# Patient Record
Sex: Female | Born: 1945
Health system: Southern US, Community
[De-identification: ages and names within clinical notes are randomized; demographics above are authoritative.]

## PROBLEM LIST (undated history)

## (undated) DIAGNOSIS — N951 Menopausal and female climacteric states: Secondary | ICD-10-CM

## (undated) DIAGNOSIS — R112 Nausea with vomiting, unspecified: Secondary | ICD-10-CM

## (undated) DIAGNOSIS — K589 Irritable bowel syndrome without diarrhea: Secondary | ICD-10-CM

## (undated) DIAGNOSIS — Z9221 Personal history of antineoplastic chemotherapy: Secondary | ICD-10-CM

## (undated) DIAGNOSIS — E039 Hypothyroidism, unspecified: Secondary | ICD-10-CM

## (undated) DIAGNOSIS — N3010 Interstitial cystitis (chronic) without hematuria: Secondary | ICD-10-CM

## (undated) DIAGNOSIS — K649 Unspecified hemorrhoids: Secondary | ICD-10-CM

## (undated) DIAGNOSIS — F419 Anxiety disorder, unspecified: Secondary | ICD-10-CM

## (undated) DIAGNOSIS — N761 Subacute and chronic vaginitis: Secondary | ICD-10-CM

## (undated) DIAGNOSIS — M1990 Unspecified osteoarthritis, unspecified site: Secondary | ICD-10-CM

## (undated) DIAGNOSIS — C50919 Malignant neoplasm of unspecified site of unspecified female breast: Secondary | ICD-10-CM

## (undated) DIAGNOSIS — Z923 Personal history of irradiation: Secondary | ICD-10-CM

## (undated) DIAGNOSIS — J45909 Unspecified asthma, uncomplicated: Secondary | ICD-10-CM

## (undated) DIAGNOSIS — C801 Malignant (primary) neoplasm, unspecified: Secondary | ICD-10-CM

## (undated) DIAGNOSIS — E162 Hypoglycemia, unspecified: Secondary | ICD-10-CM

## (undated) DIAGNOSIS — J189 Pneumonia, unspecified organism: Secondary | ICD-10-CM

## (undated) DIAGNOSIS — M797 Fibromyalgia: Secondary | ICD-10-CM

## (undated) DIAGNOSIS — K219 Gastro-esophageal reflux disease without esophagitis: Secondary | ICD-10-CM

## (undated) DIAGNOSIS — Z9889 Other specified postprocedural states: Secondary | ICD-10-CM

## (undated) HISTORY — DX: Menopausal and female climacteric states: N95.1

## (undated) HISTORY — DX: Subacute and chronic vaginitis: N76.1

## (undated) HISTORY — DX: Unspecified hemorrhoids: K64.9

## (undated) HISTORY — DX: Irritable bowel syndrome without diarrhea: K58.9

## (undated) HISTORY — PX: BREAST BIOPSY: SHX20

## (undated) HISTORY — PX: NASAL SINUS SURGERY: SHX719

## (undated) HISTORY — DX: Interstitial cystitis (chronic) without hematuria: N30.10

## (undated) HISTORY — PX: OTHER SURGICAL HISTORY: SHX169

## (undated) HISTORY — PX: BREAST LUMPECTOMY: SHX2

## (undated) HISTORY — PX: CARDIAC CATHETERIZATION: SHX172

## (undated) HISTORY — PX: BREAST SURGERY: SHX581

## (undated) HISTORY — PX: BLADDER SUSPENSION: SHX72

## (undated) HISTORY — DX: Hypoglycemia, unspecified: E16.2

## (undated) HISTORY — PX: BUNIONECTOMY: SHX129

## (undated) HISTORY — PX: COLONOSCOPY: SHX174

## (undated) HISTORY — DX: Personal history of irradiation: Z92.3

## (undated) HISTORY — DX: Fibromyalgia: M79.7

## (undated) HISTORY — DX: Unspecified asthma, uncomplicated: J45.909

## (undated) HISTORY — DX: Anxiety disorder, unspecified: F41.9

## (undated) HISTORY — PX: DENTAL SURGERY: SHX609

---
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
## 1977-04-30 DIAGNOSIS — Z9889 Other specified postprocedural states: Secondary | ICD-10-CM

## 1977-04-30 DIAGNOSIS — R112 Nausea with vomiting, unspecified: Secondary | ICD-10-CM

## 1977-04-30 DIAGNOSIS — J189 Pneumonia, unspecified organism: Secondary | ICD-10-CM

## 1977-04-30 HISTORY — DX: Pneumonia, unspecified organism: J18.9

## 1977-04-30 HISTORY — DX: Other specified postprocedural states: Z98.890

## 1977-04-30 HISTORY — DX: Other specified postprocedural states: R11.2

## 1980-04-30 DIAGNOSIS — M797 Fibromyalgia: Secondary | ICD-10-CM

## 1980-04-30 HISTORY — DX: Fibromyalgia: M79.7

## 1996-04-30 DIAGNOSIS — N761 Subacute and chronic vaginitis: Secondary | ICD-10-CM

## 1996-04-30 DIAGNOSIS — N3010 Interstitial cystitis (chronic) without hematuria: Secondary | ICD-10-CM

## 1996-04-30 HISTORY — DX: Subacute and chronic vaginitis: N76.1

## 1996-04-30 HISTORY — DX: Interstitial cystitis (chronic) without hematuria: N30.10

## 1997-12-31 ENCOUNTER — Ambulatory Visit (HOSPITAL_COMMUNITY): Admission: RE | Admit: 1997-12-31 | Discharge: 1997-12-31 | Payer: Self-pay | Admitting: Urology

## 1998-05-06 ENCOUNTER — Other Ambulatory Visit: Admission: RE | Admit: 1998-05-06 | Discharge: 1998-05-06 | Payer: Self-pay | Admitting: Obstetrics and Gynecology

## 1999-05-22 ENCOUNTER — Encounter: Payer: Self-pay | Admitting: Obstetrics and Gynecology

## 1999-05-22 ENCOUNTER — Ambulatory Visit (HOSPITAL_COMMUNITY): Admission: RE | Admit: 1999-05-22 | Discharge: 1999-05-22 | Payer: Self-pay | Admitting: Obstetrics and Gynecology

## 1999-06-05 ENCOUNTER — Other Ambulatory Visit: Admission: RE | Admit: 1999-06-05 | Discharge: 1999-06-05 | Payer: Self-pay | Admitting: Obstetrics and Gynecology

## 1999-06-06 ENCOUNTER — Other Ambulatory Visit: Admission: RE | Admit: 1999-06-06 | Discharge: 1999-06-06 | Payer: Self-pay | Admitting: Obstetrics and Gynecology

## 1999-06-06 ENCOUNTER — Encounter (INDEPENDENT_AMBULATORY_CARE_PROVIDER_SITE_OTHER): Payer: Self-pay | Admitting: Specialist

## 2000-05-20 ENCOUNTER — Encounter: Admission: RE | Admit: 2000-05-20 | Discharge: 2000-05-20 | Payer: Self-pay | Admitting: Obstetrics and Gynecology

## 2000-05-20 ENCOUNTER — Encounter: Payer: Self-pay | Admitting: Obstetrics and Gynecology

## 2000-06-04 ENCOUNTER — Other Ambulatory Visit: Admission: RE | Admit: 2000-06-04 | Discharge: 2000-06-04 | Payer: Self-pay | Admitting: Obstetrics and Gynecology

## 2001-04-30 DIAGNOSIS — E162 Hypoglycemia, unspecified: Secondary | ICD-10-CM

## 2001-04-30 HISTORY — DX: Hypoglycemia, unspecified: E16.2

## 2001-09-25 ENCOUNTER — Encounter: Admission: RE | Admit: 2001-09-25 | Discharge: 2001-09-25 | Payer: Self-pay | Admitting: Internal Medicine

## 2001-09-25 ENCOUNTER — Encounter: Payer: Self-pay | Admitting: Internal Medicine

## 2001-10-20 ENCOUNTER — Other Ambulatory Visit: Admission: RE | Admit: 2001-10-20 | Discharge: 2001-10-20 | Payer: Self-pay | Admitting: Obstetrics and Gynecology

## 2003-01-26 ENCOUNTER — Other Ambulatory Visit: Admission: RE | Admit: 2003-01-26 | Discharge: 2003-01-26 | Payer: Self-pay | Admitting: Obstetrics and Gynecology

## 2003-01-27 ENCOUNTER — Encounter: Payer: Self-pay | Admitting: Obstetrics and Gynecology

## 2003-01-27 ENCOUNTER — Encounter: Admission: RE | Admit: 2003-01-27 | Discharge: 2003-01-27 | Payer: Self-pay | Admitting: Obstetrics and Gynecology

## 2004-02-03 ENCOUNTER — Other Ambulatory Visit: Admission: RE | Admit: 2004-02-03 | Discharge: 2004-02-03 | Payer: Self-pay | Admitting: Obstetrics and Gynecology

## 2004-03-06 ENCOUNTER — Ambulatory Visit: Payer: Self-pay | Admitting: Infectious Diseases

## 2004-03-20 ENCOUNTER — Ambulatory Visit: Payer: Self-pay | Admitting: Infectious Diseases

## 2004-04-10 ENCOUNTER — Ambulatory Visit: Payer: Self-pay | Admitting: Infectious Diseases

## 2004-05-09 ENCOUNTER — Ambulatory Visit: Payer: Self-pay | Admitting: Infectious Diseases

## 2004-06-28 ENCOUNTER — Emergency Department (HOSPITAL_COMMUNITY): Admission: EM | Admit: 2004-06-28 | Discharge: 2004-06-28 | Payer: Self-pay | Admitting: Emergency Medicine

## 2004-07-06 ENCOUNTER — Inpatient Hospital Stay (HOSPITAL_COMMUNITY): Admission: RE | Admit: 2004-07-06 | Discharge: 2004-07-12 | Payer: Self-pay | Admitting: Orthopedic Surgery

## 2004-07-06 HISTORY — PX: LEG SURGERY: SHX1003

## 2005-11-02 ENCOUNTER — Other Ambulatory Visit: Admission: RE | Admit: 2005-11-02 | Discharge: 2005-11-02 | Payer: Self-pay | Admitting: Obstetrics and Gynecology

## 2005-11-07 ENCOUNTER — Encounter: Admission: RE | Admit: 2005-11-07 | Discharge: 2005-11-07 | Payer: Self-pay | Admitting: Obstetrics and Gynecology

## 2006-02-08 ENCOUNTER — Encounter (INDEPENDENT_AMBULATORY_CARE_PROVIDER_SITE_OTHER): Payer: Self-pay | Admitting: Specialist

## 2006-02-08 ENCOUNTER — Inpatient Hospital Stay (HOSPITAL_COMMUNITY): Admission: EM | Admit: 2006-02-08 | Discharge: 2006-02-10 | Payer: Self-pay | Admitting: *Deleted

## 2006-02-08 HISTORY — PX: CHOLECYSTECTOMY: SHX55

## 2006-04-30 DIAGNOSIS — M1990 Unspecified osteoarthritis, unspecified site: Secondary | ICD-10-CM

## 2006-04-30 HISTORY — DX: Unspecified osteoarthritis, unspecified site: M19.90

## 2006-12-24 ENCOUNTER — Encounter: Admission: RE | Admit: 2006-12-24 | Discharge: 2006-12-24 | Payer: Self-pay | Admitting: Internal Medicine

## 2006-12-31 ENCOUNTER — Other Ambulatory Visit: Admission: RE | Admit: 2006-12-31 | Discharge: 2006-12-31 | Payer: Self-pay | Admitting: Obstetrics and Gynecology

## 2007-05-01 DIAGNOSIS — K649 Unspecified hemorrhoids: Secondary | ICD-10-CM

## 2007-05-01 HISTORY — DX: Unspecified hemorrhoids: K64.9

## 2007-07-22 ENCOUNTER — Encounter: Payer: Self-pay | Admitting: Infectious Diseases

## 2007-08-27 ENCOUNTER — Ambulatory Visit: Payer: Self-pay | Admitting: Infectious Diseases

## 2007-08-27 DIAGNOSIS — M810 Age-related osteoporosis without current pathological fracture: Secondary | ICD-10-CM

## 2007-08-27 DIAGNOSIS — M797 Fibromyalgia: Secondary | ICD-10-CM

## 2007-08-27 DIAGNOSIS — N760 Acute vaginitis: Secondary | ICD-10-CM

## 2007-08-27 DIAGNOSIS — N3010 Interstitial cystitis (chronic) without hematuria: Secondary | ICD-10-CM | POA: Insufficient documentation

## 2007-10-08 ENCOUNTER — Ambulatory Visit: Payer: Self-pay | Admitting: Infectious Diseases

## 2007-10-17 ENCOUNTER — Telehealth: Payer: Self-pay | Admitting: Infectious Diseases

## 2008-01-08 ENCOUNTER — Ambulatory Visit: Payer: Self-pay | Admitting: Obstetrics and Gynecology

## 2008-01-08 ENCOUNTER — Encounter: Payer: Self-pay | Admitting: Obstetrics and Gynecology

## 2008-01-08 ENCOUNTER — Other Ambulatory Visit: Admission: RE | Admit: 2008-01-08 | Discharge: 2008-01-08 | Payer: Self-pay | Admitting: Obstetrics and Gynecology

## 2008-02-16 ENCOUNTER — Encounter: Admission: RE | Admit: 2008-02-16 | Discharge: 2008-02-16 | Payer: Self-pay | Admitting: Internal Medicine

## 2008-12-29 ENCOUNTER — Encounter: Admission: RE | Admit: 2008-12-29 | Discharge: 2008-12-29 | Payer: Self-pay | Admitting: Obstetrics and Gynecology

## 2009-01-10 ENCOUNTER — Encounter: Payer: Self-pay | Admitting: Obstetrics and Gynecology

## 2009-01-10 ENCOUNTER — Ambulatory Visit: Payer: Self-pay | Admitting: Obstetrics and Gynecology

## 2009-01-10 ENCOUNTER — Other Ambulatory Visit: Admission: RE | Admit: 2009-01-10 | Discharge: 2009-01-10 | Payer: Self-pay | Admitting: Obstetrics and Gynecology

## 2009-01-11 ENCOUNTER — Ambulatory Visit: Payer: Self-pay | Admitting: Obstetrics and Gynecology

## 2009-06-06 ENCOUNTER — Ambulatory Visit: Payer: Self-pay | Admitting: Obstetrics and Gynecology

## 2009-06-14 ENCOUNTER — Encounter (INDEPENDENT_AMBULATORY_CARE_PROVIDER_SITE_OTHER): Payer: Self-pay | Admitting: *Deleted

## 2009-06-14 DIAGNOSIS — K219 Gastro-esophageal reflux disease without esophagitis: Secondary | ICD-10-CM

## 2009-06-14 DIAGNOSIS — G4700 Insomnia, unspecified: Secondary | ICD-10-CM | POA: Insufficient documentation

## 2009-06-14 DIAGNOSIS — E039 Hypothyroidism, unspecified: Secondary | ICD-10-CM | POA: Insufficient documentation

## 2009-06-14 DIAGNOSIS — G609 Hereditary and idiopathic neuropathy, unspecified: Secondary | ICD-10-CM | POA: Insufficient documentation

## 2009-06-14 DIAGNOSIS — K649 Unspecified hemorrhoids: Secondary | ICD-10-CM | POA: Insufficient documentation

## 2009-06-14 DIAGNOSIS — K589 Irritable bowel syndrome without diarrhea: Secondary | ICD-10-CM

## 2009-06-14 DIAGNOSIS — F329 Major depressive disorder, single episode, unspecified: Secondary | ICD-10-CM

## 2009-06-14 DIAGNOSIS — J309 Allergic rhinitis, unspecified: Secondary | ICD-10-CM | POA: Insufficient documentation

## 2009-06-14 HISTORY — DX: Irritable bowel syndrome, unspecified: K58.9

## 2009-06-15 ENCOUNTER — Ambulatory Visit: Payer: Self-pay | Admitting: Infectious Diseases

## 2009-07-06 ENCOUNTER — Encounter: Payer: Self-pay | Admitting: Infectious Diseases

## 2009-07-06 ENCOUNTER — Ambulatory Visit: Payer: Self-pay | Admitting: Infectious Diseases

## 2009-08-01 ENCOUNTER — Encounter: Payer: Self-pay | Admitting: Infectious Diseases

## 2009-09-12 ENCOUNTER — Encounter: Admission: RE | Admit: 2009-09-12 | Discharge: 2009-09-12 | Payer: Self-pay | Admitting: Obstetrics and Gynecology

## 2010-01-17 ENCOUNTER — Other Ambulatory Visit: Admission: RE | Admit: 2010-01-17 | Discharge: 2010-01-17 | Payer: Self-pay | Admitting: Obstetrics and Gynecology

## 2010-01-17 ENCOUNTER — Ambulatory Visit: Payer: Self-pay | Admitting: Obstetrics and Gynecology

## 2010-01-24 ENCOUNTER — Ambulatory Visit: Payer: Self-pay | Admitting: Obstetrics and Gynecology

## 2010-02-07 ENCOUNTER — Ambulatory Visit: Payer: Self-pay | Admitting: Obstetrics and Gynecology

## 2010-02-09 ENCOUNTER — Ambulatory Visit: Payer: Self-pay | Admitting: Obstetrics and Gynecology

## 2010-05-21 ENCOUNTER — Encounter: Payer: Self-pay | Admitting: Internal Medicine

## 2010-05-30 NOTE — Miscellaneous (Signed)
Summary: [facility]   Imported By: Florinda Marker 08/12/2009 14:54:42  _____________________________________________________________________  External Attachment:    Type:   Image     Comment:   External Document

## 2010-05-30 NOTE — Assessment & Plan Note (Signed)
Summary: Office Visit (Infectious Disease)    CC:  3 week f/u.  History of Present Illness: 65 yo F with hx of recurrent yeast infections.  She has previously had vaginal cx with glabrata.Was seen in ID clinic 2009. Was given voriconazole.  Has had ongoing vaginitis since prev visit. Has been using metrogel. Having green d/c with odor, no improvement with metrogel. Then tried terazol without improvement. had severe headache as well and abd discomfort. Has alos had anal pain and d/c. Has had occas f/c. Was seen by MD and satrted on ketoconazole and nystatin vaginal inserts. No improvement. Started taking vfend 2-12 and had some improvement. NL CBC and CMP 06-13-09.  At her previous ID visit she was restarted on her voriconazole. She had a nl vaginal cx and her fungal cx is no growth to date. now feels better. not totally gone but decreased burning (intermittent). Still some d/c but better. feels like her systemic signs have resolved.   Preventive Screening-Counseling & Management  Alcohol-Tobacco     Alcohol drinks/day: 0     Smoking Status: never     Year Quit: 12 YEARS AGO  Caffeine-Diet-Exercise     Caffeine use/day: coffee     Does Patient Exercise: no  Safety-Violence-Falls     Seat Belt Use: yes   Updated Prior Medication List: FEMHRT LOW DOSE 0.5-2.5 MG-MCG  TABS (NORETHINDRONE-ETH ESTRADIOL) Take 1 tablet by mouth once a day SYNTHROID 137 MCG TABS (LEVOTHYROXINE SODIUM)  DYAZIDE 37.5-25 MG  CAPS (TRIAMTERENE-HCTZ) Take 1 tablet by mouth once a day WELLBUTRIN XL 300 MG  TB24 (BUPROPION HCL) Take 1 tablet by mouth once a day PROTONIX 40 MG  PACK (PANTOPRAZOLE SODIUM) Take 1 tablet by mouth once a day VICODIN 5-500 MG  TABS (HYDROCODONE-ACETAMINOPHEN) as needed AMBIEN 10 MG  TABS (ZOLPIDEM TARTRATE) Take 1 tablet by mouth at bedtime TERAZOL 7 0.4 %  CREA (TERCONAZOLE) as needed VALTREX 1 GM  TABS (VALACYCLOVIR HCL) as needed SOMA 350 MG  TABS (CARISOPRODOL) Take 1 tablet by  mouth at bedtime VFEND 200 MG  TABS (VORICONAZOLE) once weekly METROGEL-VAGINAL 0.75 %  GEL (METRONIDAZOLE) 1 application CLOTRIMAZOLE-BETAMETHASONE 1-0.05 %  CREA (CLOTRIMAZOLE-BETAMETHASONE) Apply 2 times a day for 2 weeks FLUTICASONE PROPIONATE 50 MCG/ACT SUSP (FLUTICASONE PROPIONATE) One spray in each nostril once daily per PCP CALCIUM-VITAMIN D 600-200 MG-UNIT TABS (CALCIUM-VITAMIN D) Take 1 tablet by mouth once a day per PCP MULTIVITAMINS  TABS (MULTIPLE VITAMIN) Take 1 tablet by mouth once a day per PCP AXERT 6.25 MG TABS (ALMOTRIPTAN MALATE) Take 1 tablet by mouth once a day as needed per PCP ANUSOL-HC 25 MG SUPP (HYDROCORTISONE ACETATE) as directed VFEND 200 MG TABS (VORICONAZOLE) 1 by mouth two times a day  Current Allergies (reviewed today): ! PENICILLIN ! MACRODANTIN ! * DIFLUCAN Vital Signs:  Patient profile:   65 year old female Height:      53 inches (134.62 cm) Weight:      164.3 pounds (74.68 kg) BMI:     41.27 Temp:     97.5 degrees F (36.39 degrees C) oral Pulse rate:   67 / minute BP sitting:   133 / 83  (left arm)  Vitals Entered By: Baxter Hire) (July 06, 2009 9:14 AM) CC: 3 week f/u Pain Assessment Patient in pain? no      Nutritional Status BMI of > 30 = obese Nutritional Status Detail appetite is okay  Does patient need assistance? Functional Status Self care Ambulation Normal  Physical Exam  General:  well-developed, well-nourished, and well-hydrated.     Impression & Recommendations:  Problem # 1:  VULVOVAGINITIS, CHRONIC (ICD-616.10) spoke at length that she may have negative cx due to being on antifungal at time of cx. that this may be due to changes in vaginal mucosa with age. her meds are refilled and she will return to clinic as needed.  The following medications were removed from the medication list:    Terazol 7 0.4 % Crea (Terconazole) .Marland Kitchen... As needed    Metrogel-vaginal 0.75 % Gel (Metronidazole) .Marland Kitchen... 1  application  Medications Added to Medication List This Visit: 1)  Clotrimazole-betamethasone 1-0.05 % Crea (Clotrimazole-betamethasone) .... Apply 2 times a day for 2 weeks Prescriptions: CLOTRIMAZOLE-BETAMETHASONE 1-0.05 %  CREA (CLOTRIMAZOLE-BETAMETHASONE) Apply 2 times a day for 2 weeks  #15g x 3   Entered and Authorized by:   Johny Sax MD   Signed by:   Johny Sax MD on 07/06/2009   Method used:   Electronically to        [facility] [phone]* (retail)       [address] [place], [place]  [address]       Ph: [phone] or [phone]       Fax: [phone]   RxID:   [id number] VFEND 200 MG TABS (VORICONAZOLE) 1 by mouth two times a day  #10 x 4   Entered and Authorized by:   Johny Sax MD   Signed by:   Johny Sax MD on 07/06/2009   Method used:   Electronically to        [facility] [phone]* (retail)       [address] [place], [place]  [address]       Ph: [phone] or [phone]       Fax: [phone]   RxID:   [id number]   Appended Document: Orders Update    Clinical Lists Changes  Orders: Added new Service order of Est. Patient Level III (70623) - Signed

## 2010-05-30 NOTE — Miscellaneous (Signed)
Summary: Problem and Medication lists updated  Clinical Lists Changes  Problems: Added new problem of PURE HYPERCHOLESTEROLEMIA (ICD-272.0) - Signed Added new problem of ESSENTIAL HYPERTENSION, BENIGN (ICD-401.1) - Signed Added new problem of UNSPECIFIED HYPOTHYROIDISM (ICD-244.9) - Signed Added new problem of DEPRESSIVE DISORDER NOT ELSEWHERE CLASSIFIED (ICD-311) - Signed Added new problem of PERIPHERAL NEUROPATHY (ICD-356.9) - Signed Added new problem of UNSPEC HEMORRHOIDS WITHOUT MENTION COMPLICATION (ICD-455.6) - Signed Added new problem of ALLERGIC RHINITIS CAUSE UNSPECIFIED (ICD-477.9) - Signed Added new problem of ACUTE GASTRITIS WITHOUT MENTION OF HEMORRHAGE (ICD-535.00) - Signed Added new problem of ESOPHAGEAL REFLUX (ICD-530.81) - Signed Added new problem of IRRITABLE BOWEL SYNDROME (ICD-564.1) - Signed Added new problem of INSOMNIA UNSPECIFIED (ICD-780.52) - Signed Medications: Changed medication from SYNTHROID 175 MCG  TABS (LEVOTHYROXINE SODIUM) Take 1 tablet by mouth once a day to SYNTHROID 137 MCG TABS (LEVOTHYROXINE SODIUM) - Signed Added new medication of FLUTICASONE PROPIONATE 50 MCG/ACT SUSP (FLUTICASONE PROPIONATE) One spray in each nostril once daily per PCP Added new medication of CALCIUM-VITAMIN D 600-200 MG-UNIT TABS (CALCIUM-VITAMIN D) Take 1 tablet by mouth once a day per PCP Added new medication of MULTIVITAMINS  TABS (MULTIPLE VITAMIN) Take 1 tablet by mouth once a day per PCP Added new medication of AXERT 6.25 MG TABS (ALMOTRIPTAN MALATE) Take 1 tablet by mouth once a day as needed per PCP

## 2010-05-30 NOTE — Assessment & Plan Note (Signed)
Summary: new pt,  has not been seen since 6/09   CC:  new patient and has not been seen since 6/09.  History of Present Illness: 65 yo F with hx of recurrent yeast infections.  She has previously had vaginal cx with glabrata.Was seen in ID clinic 2009. Was given voriconazole.  Has had ongoing vaginitis since prev visit. Has been using metrogel. Having green d/c with odor, no improvement with metrogel. Then tried terazol without improvement. had severe headache as well and abd discomfort. Has alos had anal pain and d/c. Has had occas f/c. Was seen by MD and satrted on ketoconazole and nystatin vaginal inserts. No improvement. Started taking vfend 2-12 and had some improvement.  NL CBC and CMP 06-13-09.   Current Medications (verified): 1)  Femhrt Low Dose 0.5-2.5 Mg-Mcg  Tabs (Norethindrone-Eth Estradiol) .... Take 1 Tablet By Mouth Once A Day 2)  Synthroid 137 Mcg Tabs (Levothyroxine Sodium) 3)  Dyazide 37.5-25 Mg  Caps (Triamterene-Hctz) .... Take 1 Tablet By Mouth Once A Day 4)  Wellbutrin Xl 300 Mg  Tb24 (Bupropion Hcl) .... Take 1 Tablet By Mouth Once A Day 5)  Protonix 40 Mg  Pack (Pantoprazole Sodium) .... Take 1 Tablet By Mouth Once A Day 6)  Vicodin 5-500 Mg  Tabs (Hydrocodone-Acetaminophen) .... As Needed 7)  Ambien 10 Mg  Tabs (Zolpidem Tartrate) .... Take 1 Tablet By Mouth At Bedtime 8)  Terazol 7 0.4 %  Crea (Terconazole) .... As Needed 9)  Valtrex 1 Gm  Tabs (Valacyclovir Hcl) .... As Needed 10)  Soma 350 Mg  Tabs (Carisoprodol) .... Take 1 Tablet By Mouth At Bedtime 11)  Vfend 200 Mg  Tabs (Voriconazole) .... Once Weekly 12)  Metrogel-Vaginal 0.75 %  Gel (Metronidazole) .Marland Kitchen.. 1 Application 13)  Clotrimazole-Betamethasone 1-0.05 %  Crea (Clotrimazole-Betamethasone) .... Apply 2 Times A Day For 2 Weeks 14)  Fluticasone Propionate 50 Mcg/act Susp (Fluticasone Propionate) .... One Spray in Each Nostril Once Daily Per Pcp 15)  Calcium-Vitamin D 600-200 Mg-Unit Tabs (Calcium-Vitamin  D) .... Take 1 Tablet By Mouth Once A Day Per Pcp 16)  Multivitamins  Tabs (Multiple Vitamin) .... Take 1 Tablet By Mouth Once A Day Per Pcp 17)  Axert 6.25 Mg Tabs (Almotriptan Malate) .... Take 1 Tablet By Mouth Once A Day As Needed Per Pcp  Allergies (verified): 1)  ! Penicillin 2)  ! Macrodantin 3)  ! * Diflucan   Preventive Screening-Counseling & Management  Alcohol-Tobacco     Alcohol drinks/day: 0     Smoking Status: never  Caffeine-Diet-Exercise     Caffeine use/day: coffee     Does Patient Exercise: no  Safety-Violence-Falls     Seat Belt Use: yes   Updated Prior Medication List: FEMHRT LOW DOSE 0.5-2.5 MG-MCG  TABS (NORETHINDRONE-ETH ESTRADIOL) Take 1 tablet by mouth once a day SYNTHROID 137 MCG TABS (LEVOTHYROXINE SODIUM)  DYAZIDE 37.5-25 MG  CAPS (TRIAMTERENE-HCTZ) Take 1 tablet by mouth once a day WELLBUTRIN XL 300 MG  TB24 (BUPROPION HCL) Take 1 tablet by mouth once a day PROTONIX 40 MG  PACK (PANTOPRAZOLE SODIUM) Take 1 tablet by mouth once a day VICODIN 5-500 MG  TABS (HYDROCODONE-ACETAMINOPHEN) as needed AMBIEN 10 MG  TABS (ZOLPIDEM TARTRATE) Take 1 tablet by mouth at bedtime TERAZOL 7 0.4 %  CREA (TERCONAZOLE) as needed VALTREX 1 GM  TABS (VALACYCLOVIR HCL) as needed SOMA 350 MG  TABS (CARISOPRODOL) Take 1 tablet by mouth at bedtime VFEND 200 MG  TABS (VORICONAZOLE) once  weekly METROGEL-VAGINAL 0.75 %  GEL (METRONIDAZOLE) 1 application CLOTRIMAZOLE-BETAMETHASONE 1-0.05 %  CREA (CLOTRIMAZOLE-BETAMETHASONE) Apply 2 times a day for 2 weeks FLUTICASONE PROPIONATE 50 MCG/ACT SUSP (FLUTICASONE PROPIONATE) One spray in each nostril once daily per PCP CALCIUM-VITAMIN D 600-200 MG-UNIT TABS (CALCIUM-VITAMIN D) Take 1 tablet by mouth once a day per PCP MULTIVITAMINS  TABS (MULTIPLE VITAMIN) Take 1 tablet by mouth once a day per PCP AXERT 6.25 MG TABS (ALMOTRIPTAN MALATE) Take 1 tablet by mouth once a day as needed per PCP  Current Allergies (reviewed today): !  PENICILLIN ! MACRODANTIN ! * DIFLUCAN Past History:  Past medical, surgical, family and social histories (including risk factors) reviewed, and no changes noted (except as noted below).  Past Medical History: Reviewed history from 08/27/2007 and no changes required. Osteoporosis  Family History: Reviewed history from 08/27/2007 and no changes required. Diabetes (grandmother) HTN husband with BM txp x2  Social History: Reviewed history from 08/27/2007 and no changes required. Married Never Smoked Alcohol use-yes- socailly  Review of Systems       eating well, wt is steady, no dysuria, see hpi.   Vital Signs:  Patient profile:   65 year old female Height:      53 inches (134.62 cm) Weight:      163.1 pounds (74.14 kg) BMI:     40.97 Temp:     98.3 degrees F (36.83 degrees C) oral Pulse rate:   81 / minute BP sitting:   132 / 82  (left arm)  Vitals Entered By: Baxter Hire) (June 15, 2009 3:01 PM) CC: new patient, has not been seen since 6/09 Pain Assessment Patient in pain? yes     Location: pelvic area Intensity: 7 Type: dull Onset of pain  Constant Nutritional Status BMI of > 30 = obese Nutritional Status Detail appetite is so-so per patient  Does patient need assistance? Functional Status Self care Ambulation Normal   Physical Exam  General:  well-developed, well-nourished, and well-hydrated.   Eyes:  pupils equal, pupils round, and pupils reactive to light.   Mouth:  pharynx pink and moist and no exudates.   Neck:  no masses.   Lungs:  normal respiratory effort and normal breath sounds.   Heart:  normal rate, regular rhythm, and no murmur.   Abdomen:  soft, non-tender, and normal bowel sounds.   Genitalia:  normal introitus, no external lesions;  labial erythema.  non-tender on exam.  Msk:  unable to illicit trigger points. she has tenderness in flanks, L >R.    Impression & Recommendations:  Problem # 1:  VULVOVAGINITIS, CHRONIC  (ICD-616.10)  Vaginal exam done with CMA. Will send for cx and sensi to try and get a better idea of what is causginthis.  will - restart her on Vfend for the next 10 days. return to clinic 3-4 weeks.  Her updated medication list for this problem includes:    Terazol 7 0.4 % Crea (Terconazole) .Marland Kitchen... As needed    Metrogel-vaginal 0.75 % Gel (Metronidazole) .Marland Kitchen... 1 application  Orders: Consultation Level IV (63875) T-Culture, Genital ([id number])  Problem # 2:  UNSPEC HEMORRHOIDS WITHOUT MENTION COMPLICATION (ICD-455.6)  wants steroid suppository for her hemerroids.   Orders: Consultation Level IV ([id number])  Medications Added to Medication List This Visit: 1)  Anusol-hc 25 Mg Supp (Hydrocortisone acetate) .... As directed Process Orders Check Orders Results:     [facility]: ABN not required for this insurance Tests Sent for requisitioning (June 15, 2009 3:44  PM):     06/15/2009: [facility] -- T-Culture, Genital [[id number]] (signed)   Appended Document: Orders Update    Clinical Lists Changes  Orders: Added new Test order of T-Culture & Smear Fungus ([id number]) - Signed      Process Orders Check Orders Results:     [facility]: ABN not required for this insurance Tests Sent for requisitioning (June 20, 2009 11:35 AM):     06/15/2009: [facility] -- T-Culture & Smear Fungus [[id number]] (signed)

## 2010-09-15 NOTE — Op Note (Signed)
NAMERHENA, GLACE               ACCOUNT NO.:  [id number]   MEDICAL RECORD NO.:  [id number]          PATIENT TYPE:  INP   LOCATION:  2856                         FACILITY:  [facility]   PHYSICIAN:  Nadara Mustard, MD     DATE OF BIRTH:  28-Dec-1945   DATE OF PROCEDURE:  07/06/2004  DATE OF DISCHARGE:                                 OPERATIVE REPORT   PREOPERATIVE DIAGNOSIS:  Closed right tibia and fibular fracture.   POSTOPERATIVE DIAGNOSIS:  Closed right tibia and fibular fracture.   PROCEDURE:  IM nailing of the right tibia with a Synthes 10 x 330 mm nail,  locked proximally and distally.   SURGEON.:  Nadara Mustard, M.D.   ANESTHESIA:  General.   ESTIMATED BLOOD LOSS:  Minimal.   ANTIBIOTICS:  Cleocin 600 mg IV.   TOURNIQUET TIME:  None.   DISPOSITION:  To PACU in stable condition.   INDICATIONS FOR PROCEDURE:  The patient is a 65 year old woman [place] closed right tib-fib fracture. She was initially  treated in a cast.  She had increased pain with the cast treatment and  wished to proceed with internal fixation. The risks and benefits were  discussed including infection, neurovascular injury, persistent pain, need  for additional surgery. The patient states she understands and wish to  proceed at this time.   DESCRIPTION OF PROCEDURE:  The patient was brought to OR room 10 and  underwent general anesthetic. After adequate level of anesthesia obtained,  the patient's right lower extremity was prepped using DuraPrep and draped  into a sterile field. A medial incision was made just medial to the patella  tendon. This was carried down to the tibial tubercle. A guide wire was  inserted. C-arm fluoroscopy verified the placement in the AP and lateral  planes. This was over reamed with the Synthes drill and a guidewire was then  inserted across the fracture site and this was checked in both AP and  lateral planes. This was sequentially reamed to an  11 mm. A 10 mm x 330 mm  nail was inserted. C-arm fluoroscopy verified reduction in AP and lateral  planes. This was locked proximally with the outrigger jig, distally it was  locked using a freehand technique. Again of placement of screws was again  verified  with C-arm fluoroscopy. The wounds were irrigated with normal saline. The  subcu was closed using 2-0 Vicryl. Skin was closed using approximating  staples. Wounds were covered with Adaptic orthopedic sponges, sterile Webril  and a Coban dressing. The patient was then extubated and taken to PACU in  stable condition.      MVD/MEDQ  D:  07/06/2004  T:  07/06/2004  Job:  [id number]

## 2010-09-15 NOTE — Op Note (Signed)
[name], [name]               ACCOUNT NO.:  [id number]   MEDICAL RECORD NO.:  [id number]          PATIENT TYPE:  INP   LOCATION:  0098                         FACILITY:  [facility]   PHYSICIAN:  Angelia Mould. Derrell Lolling, M.D.DATE OF BIRTH:  [birth year]   DATE OF PROCEDURE:  02/08/2006  DATE OF DISCHARGE:                                 OPERATIVE REPORT   PREOPERATIVE DIAGNOSIS:  Subacute cholecystitis with cholelithiasis.   POSTOPERATIVE DIAGNOSIS:  Subacute cholecystitis with cholelithiasis.   OPERATION PERFORMED:  Laparoscopic cholecystectomy with intraoperative  cholangiogram.   SURGEON:  Angelia Mould. Derrell Lolling, M.D.   FIRST ASSISTANT:  Currie Paris, M.D.   OPERATIVE INDICATIONS:  This is a 65 year old white female who has  fibromyalgia, mild hypertension, interstitial cystitis and has had a C-  section in the past.  This morning at 8:00 a.m. after breakfast, she  developed sudden sharp stabbing epigastric pain.  This lasted for several  hours and was very distressing to her.  She had mild nausea, but no fever,  no vomiting.  She came to the emergency room.  A gallbladder ultrasound  shows layering gallstones, but there is no significant wall thickening and  no dilatation of the common bile duct.  White blood cell count was mildly  elevated to 11,700.  SGOT was mildly elevated to 79, lipase was 15, cardiac  enzymes were normal.  I was asked to see her.  She was feeling better but  still a little bit tender.  I felt that she had had a moderately severe  gallbladder attack.  She wanted to stay and have surgery at this time rather  than that defer it until later and I felt that that was appropriate.  She  has been placed on intravenous antibiotics and she is brought to operating  room.   OPERATIVE FINDINGS:  The gallbladder was slightly edematous but was not  severely inflamed.  The anatomy of the cystic duct and cystic artery and  common bile duct were conventional.  The  cholangiogram showed normal  intrahepatic and extrahepatic biliary anatomy, no filling defect, and no  obstruction with good flow of contrast into the duodenum.  The liver,  stomach, duodenum, small intestine and large intestine were grossly normal  to inspection.   OPERATIVE TECHNIQUE:  Following the induction of general endotracheal  anesthesia, the patient's abdomen was prepped and draped in sterile fashion.  She was given intravenous Cipro.  We used 0.5% Marcaine with epinephrine as  local infiltration anesthetic.  A vertically oriented incision was made  inside the lower rim of the umbilicus.  The fascia was incised in the  midline and the abdominal cavity entered under direct vision.  A 10 mm  Hassan trocar was inserted and secured the pursestring suture of 0 Vicryl.  Pneumoperitoneum was created.  Video camera was inserted with visualization  and findings as described above.  A 10 mm trocar was placed in the  subxiphoid region and two 5 mm trocars placed in the right mid abdomen.  The  gallbladder fundus was identified and elevated.  The infundibulum of the  gallbladder was identified and retracted laterally.  I dissected out the  cystic duct and cystic artery.  A cholangiogram catheter was inserted into  the cystic duct and a cholangiogram was obtained using the C-arm.  The  cholangiogram was normal in all respects as described above.  The  cholangiogram catheter was removed.  The cystic duct was secured with  multiple metal clips and divided.  Cystic artery was then isolated, secured  with metal clips and divided as it went onto the wall of the gallbladder.  The gallbladder was then dissected from its bed with electrocautery placed  in the specimen bag and removed.  The operative field was copiously  irrigated with 1 liter of saline.  At the completion of the case, there was  no bleeding and no bile leak whatsoever.  The irrigation fluid return was  completely clear.  The trocars  were removed under direct vision and there  was no bleeding from trocar sites.  The pneumoperitoneum was released.  The  fascia at the umbilicus closed with 0 Vicryl sutures.  Skin incisions were  closed with subcuticular sutures of 4-0 Monocryl and Steri-Strips.  Clean  bandages were placed and the patient taken the recovery room in stable  condition.   ESTIMATED BLOOD LOSS:  About 10-20 mL.   COMPLICATIONS:  None.   Sponge, needle and instrument counts were correct.      Angelia Mould. Derrell Lolling, M.D.  Electronically Signed     HMI/MEDQ  D:  02/08/2006  T:  02/11/2006  Job:  [id number]   cc:   Ladell Pier, M.D.  Fax: [phone]   Areatha Keas, M.D.  Fax: [phone]   Jamison Neighbor, M.D.  Fax: [phone]

## 2010-09-15 NOTE — Consult Note (Signed)
[name], [name]               ACCOUNT NO.:  [id number]   MEDICAL RECORD NO.:  [id number]          PATIENT TYPE:  INP   LOCATION:  2130                         FACILITY:  [facility]   PHYSICIAN:  Lyn Records, M.D.   DATE OF BIRTH:  April 26, 1946   DATE OF CONSULTATION:  02/08/2006  DATE OF DISCHARGE:                                   CONSULTATION   REASON FOR CONSULTATION:  Sinus pauses.   CONCLUSION:  1. Sinus pauses all documented at less than three seconds possibly due to      early sick sinus syndrome; no symptoms reported.  2. Status post cholecystectomy for acute cholecystitis on February 08, 2006.   RECOMMENDATIONS:  1. Monitored bed.  2. No specific therapy unless pauses are documented to be greater than      three seconds or associated with symptoms.  3. Check troponin I.   COMMENTS:  The patient is 65 years of age and developed a subacute  cholecystitis with cholelithiasis and underwent laparoscopic cholecystectomy  on February 08, 2006 y Dr. Claud [name].  During the operation and in  recovery she was noted to have frequent sinus pauses, but none documented to  be greater than three seconds.  The patient had no cardiac symptomatology  preceding the gallbladder operation.  She specifically denies tachy  palpitations and syncope, chest discomfort, orthopnea and PND.   MEDICATIONS:  The patient's medications include Ambien, _________, Protonix,  Soma, Synthroid, Vicodin, and Wellbutrin   PAST MEDICAL HISTORY:  Significant medical problems consist of:  1. Fibromyalgia.  2. Interstitial cystitis.   HABITS:  The patient does not smoke.  She has occasional alcohol.   ALLERGIES:  PENICILLINS, MACRODANTIN and DIFLUCAN.   FAMILY HISTORY:  The family history is noncontributory.   OBJECTIVE:  GENERAL APPEARANCE:  On examination the patient is awake in the  recovery area.  She is asymptomatic with the exception of some abdominal  discomfort related to the surgery.  VITAL SIGNS:  The patient's blood pressure is 120/72, heart rate 58 and  regular.  HEART:  Cardiac exam is normal.  NECK:  The neck examination reveals no jugular venous distention or carotid  bruits.  ABDOMEN:  The abdomen is mildly tender.  EXTREMITIES:  The extremities have no edema.   LABORATORY DATA:  EKG demonstrates sinus rhythm with nonspecific ST-T wave  changes predominantly T wave flattening.  Rhythm strips are reviewed and all  pauses are less than three seconds, and no greater length than 2.4 seconds.   Laboratory data includes a potassium of 3.2.  Hemoglobin 13.7.  Troponin I  and CK/MB negative times two.  Mildly elevated AST and ALT.  Chest x-ray  showed hyperaeration of the lungs, cardiac size is normal.   DISCUSSION:  The patient is asymptomatic and has not had any antecedent  cardiopulmonary complaints.  Her electrocardiogram is nonacute, which was  done this morning at 9:09 A.M.  We will repeat her electrocardiogram in the  A.M.  She will be placed in a monitored bed.  No specific evaluation will be  require unless symptoms are associated with pauses or pauses are greater  than three seconds.  F this occurs she may be a candidate for permanent  pacemaker therapy.      Lyn Records, M.D.  Electronically Signed     HWS/MEDQ  D:  02/08/2006  T:  02/10/2006  Job:  [id number]   cc:   [name], M.D.  Fax: [phone]   [name], M.D.  [address]  [place]  [place] [address]

## 2010-09-15 NOTE — H&P (Signed)
[name], [name]               ACCOUNT NO.:  [id number]   MEDICAL RECORD NO.:  [id number]          PATIENT TYPE:  INP   LOCATION:  0098                         FACILITY:  [facility]   PHYSICIAN:  Angelia Mould. Derrell Lolling, M.D.DATE OF BIRTH:  12-Dec-1945   DATE OF ADMISSION:  02/08/2006  DATE OF DISCHARGE:                                HISTORY & PHYSICAL   CHIEF COMPLAINT:  Epigastric pain and gallstones.   HISTORY OF PRESENT ILLNESS:  This is a very pleasant 65 year old white  female who has been on clindamycin in the past 48 hours for tooth abscesses.  She felt well yesterday.  This morning after breakfast, she developed sharp  stabbing epigastric pain and mild nausea.  This was severe and she came to  the Emergency Department.  She was evaluated by Dr. Mariel Aloe.  She was  given Dilaudid which has been very successful in controlling her pain.  Gallbladder ultrasound shows layering gallstones and a normal common bile  duct and no evidence of any severe inflammation.  She has been counseled  regarding her gallbladder attack.  She is fearful of having another attack  and wants to go ahead with cholecystectomy at this time.  She is being  admitted for that purpose.   PAST HISTORY:  1. Fibromyalgia.  2. Interstitial cystitis.  3. Mononucleosis age 67.  4. Cesarean section, 1979.  5. Multiple sinus surgeries.  6. Multiple bladder distentions.  7. Bunion surgery.  8. Open reduction internal fixation of a fractured tibia with slow healing      in the past by Dr. Lajoyce Corners.  9. Mild asthma.   MEDICATIONS:  Synthroid, Dyazide, femhrt, Wellbutrin, Protonix, Soma at  bedtime, Vicodin p.r.n. pain, Urised p.r.n. bladder symptoms   DRUG ALLERGIES:  PENICILLIN CAUSES SWELLING IN THE MOUTH.  DIFLUCAN CAUSED  TRANSIENT ELEVATION OF LIVER FUNCTION TESTS IN THE PAST.  MACRODANTIN.   SOCIAL HISTORY:  The patient is married.  Her husband is here with her.  He  is an attorney here in town.  The patient  works as a Agricultural consultant for [employer].  She denies use of alcohol.  Smokes cigarettes very rarely.  They  have two children.   FAMILY HISTORY:  Mother deceased.  Had congestive heart failure.  Father  deceased, had Parkinson's disease.  Two sisters living, one had melanoma,  both have fibromyalgia.   REVIEW OF SYSTEMS:  A 15-system review of systems is performed and is  noncontributory except as described above.   PHYSICAL EXAMINATION:  GENERAL:  A very pleasant middle-aged woman who is  pleasant and cooperative, slightly lethargic from the Dilaudid  but  minimally so.  VITAL SIGNS:  Temperature 97.0, blood pressure 126/83, pulse 63,  respirations 20.  HEENT:  Eyes:  Sclerae are clear.  Her extraocular movements are intact.  Ears, nose, mouth, and throat:  Nose, lips, tongue, and oropharynx are  without gross lesions.  There is no palpable facial or mandibular  abnormality or tenderness.  NECK:  Supple, nontender.  I do not feel any  submandibular or cervical or supraclavicular adenopathy.  Thyroid feels  normal.  No jugular venous distention.  LUNGS:  Clear to auscultation.  No chest wall tenderness.  HEART:  Regular rate and rhythm.  Radial and femoral pulses are palpable.  BREASTS:  Not examined.  ABDOMEN: Soft.  Bowel sounds are present but somewhat hypoactive.  Minimal  subjective epigastric and right upper quadrant tenderness.  No objective  guarding.  No mass.  Liver and spleen are not enlarged.  No hernias are  noted.  EXTREMITIES:  She moves all four extremities well without pain or  deformity.  There is no peripheral edema.  NEUROLOGIC: No gross motor or  sensory deficits.   DATA:  Ordered and reviewed.  The ultrasound shows layering gallstones but  no inflammatory changes and a normal common bile duct.  Hemoglobin 13.7,  white blood cell count 11,700.  Chemistries show the following  abnormalities:  Potassium 3.2, SGOT 79, SGPT 42, otherwise the liver  function tests  are normal.  Lipase is 15.  CK-MB and troponin are normal.  The EKG shows normal sinus rhythm; nonspecific T-wave abnormality; no acute  changes.   ASSESSMENT:  1. Subacute cholecystitis with cholelithiasis.  2. Interstitial cystitis.  3. Fibromyalgia.   PLAN:  1. The patient will be admitted.  2. She will be taken to the operating room for a laparoscopic      cholecystectomy with cholangiogram, hopefully this afternoon.  3. I have discussed the indications and details of surgery with the      patient and her husband.  Risks and complications have been outlined,      including but not limited to bleeding, infection, conversion to open      laparotomy, injury to adjacent organs such as the bile duct or      intestine with major reconstructive surgery, wound problems such as      infection or hernia, cardiac, pulmonary, or thromboembolic problems.      She seems to understand these issues well.  At this time, all of her      questions were answered.  She is in full agreement with this plan.      Angelia Mould. Derrell Lolling, M.D.  Electronically Signed     HMI/MEDQ  D:  02/08/2006  T:  02/10/2006  Job:  [id number]   cc:   DR. ____________   [name]  Fax: [phone]

## 2010-09-15 NOTE — Discharge Summary (Signed)
NAMEJABREE, PERNICE               ACCOUNT NO.:  [id number]   MEDICAL RECORD NO.:  [id number]          PATIENT TYPE:  INP   LOCATION:  5018                         FACILITY:  [facility]   PHYSICIAN:  Nadara Mustard, MD     DATE OF BIRTH:  [birth year]   DATE OF ADMISSION:  07/06/2004  DATE OF DISCHARGE:  07/11/2004                                 DISCHARGE SUMMARY   DIAGNOSIS:  Closed right tibia fracture.   PROCEDURE:  IM nailing, right tibia.   DISPOSITION:  Discharged to home in stable condition with home health  physical therapy and plan to follow up in the office in 2 weeks.   INDICATION FOR PROCEDURE:  The patient is a 65 year old woman status post  closed right tibial fracture.  She was initially treated with closed  reduction and casting.  The patient had a significant amount of pain and was  unable to ambulate due to pain and the weight of the cast, and wishes to  proceed with internal fixation.   HOSPITAL COURSE:  The patient's hospital course was essentially  unremarkable.  She underwent IM nailing of the right tibia on July 06, 2004  with a Synthes tibial nail locked proximally and distally.  Postoperatively,  she was seen by physical therapy with progressive ambulation and touchdown  weightbearing on the right.  She received aspirin for DVT prophylaxis and  she received Cleocin for infection prophylaxis.  The patient progressed well  with therapy and was felt to be safe for discharge to home and she was  discharged to home on July 11, 2004 with a prescription for Vicodin and  Tylox for pain and plan to follow up in the office in 1-2 weeks.      MVD/MEDQ  D:  09/06/2004  T:  09/06/2004  Job:  [id number]

## 2011-01-04 ENCOUNTER — Other Ambulatory Visit: Payer: Self-pay | Admitting: Obstetrics and Gynecology

## 2011-01-04 DIAGNOSIS — Z1231 Encounter for screening mammogram for malignant neoplasm of breast: Secondary | ICD-10-CM

## 2011-01-09 ENCOUNTER — Ambulatory Visit
Admission: RE | Admit: 2011-01-09 | Discharge: 2011-01-09 | Disposition: A | Discharge status: Home / Self Care | Payer: BLUE CROSS/BLUE SHIELD | Source: Ambulatory Visit | Attending: Obstetrics and Gynecology | Admitting: Obstetrics and Gynecology

## 2011-01-09 DIAGNOSIS — Z1231 Encounter for screening mammogram for malignant neoplasm of breast: Secondary | ICD-10-CM

## 2011-01-22 DIAGNOSIS — N3010 Interstitial cystitis (chronic) without hematuria: Secondary | ICD-10-CM | POA: Insufficient documentation

## 2011-01-22 DIAGNOSIS — N951 Menopausal and female climacteric states: Secondary | ICD-10-CM | POA: Insufficient documentation

## 2011-01-22 DIAGNOSIS — N761 Subacute and chronic vaginitis: Secondary | ICD-10-CM | POA: Insufficient documentation

## 2011-01-22 DIAGNOSIS — M797 Fibromyalgia: Secondary | ICD-10-CM | POA: Insufficient documentation

## 2011-01-31 ENCOUNTER — Encounter: Payer: Self-pay | Admitting: Obstetrics and Gynecology

## 2011-01-31 ENCOUNTER — Ambulatory Visit (INDEPENDENT_AMBULATORY_CARE_PROVIDER_SITE_OTHER): Payer: Medicare Other | Admitting: Obstetrics and Gynecology

## 2011-01-31 VITALS — BP 116/74 | Ht 63.0 in | Wt 172.0 lb

## 2011-01-31 DIAGNOSIS — N898 Other specified noninflammatory disorders of vagina: Secondary | ICD-10-CM

## 2011-01-31 DIAGNOSIS — M8580 Other specified disorders of bone density and structure, unspecified site: Secondary | ICD-10-CM

## 2011-01-31 DIAGNOSIS — Z780 Asymptomatic menopausal state: Secondary | ICD-10-CM

## 2011-01-31 DIAGNOSIS — N952 Postmenopausal atrophic vaginitis: Secondary | ICD-10-CM

## 2011-01-31 DIAGNOSIS — M899 Disorder of bone, unspecified: Secondary | ICD-10-CM

## 2011-01-31 DIAGNOSIS — B373 Candidiasis of vulva and vagina: Secondary | ICD-10-CM

## 2011-01-31 DIAGNOSIS — N951 Menopausal and female climacteric states: Secondary | ICD-10-CM

## 2011-01-31 DIAGNOSIS — B3731 Acute candidiasis of vulva and vagina: Secondary | ICD-10-CM

## 2011-01-31 MED ORDER — TERCONAZOLE 0.8 % VA CREA: 1.0000 | TOPICAL_CREAM | Freq: Every day | VAGINAL | Status: AC

## 2011-01-31 MED ORDER — ESTRADIOL 0.1 MG/GM VA CREA: TOPICAL_CREAM | VAGINAL | Status: DC

## 2011-01-31 MED ORDER — NORETHINDRONE-ETH ESTRADIOL 1-5 MG-MCG PO TABS: 1.0000 | ORAL_TABLET | Freq: Every day | ORAL | Status: DC

## 2011-01-31 MED ORDER — METRONIDAZOLE 0.75 % VA GEL: VAGINAL | Status: DC

## 2011-01-31 NOTE — Progress Notes (Signed)
Subjective:     Patient ID: [name], female   DOB: 06-08-1945, 65 y.o.   MRN: [id number]  HPIpatient came back to see me today. She thinks she has recurrent vaginal infection. She is having discharge and itching. She gets  Them frequently. We think her fibromyalgia as part of the issue. She uses oral rephresh. She is having no vaginal bleeding. She is happy both on her oral HRT and her vaginal cream. She went back to the higher dose Activella because of symptoms. It is working very well without bleeding. She is up-to-date on mammograms. She is due for followup bone density. She does have low bone mass and takes calcium and vitamin D. She is having no pelvic pain.patient is also noticed some bright red rectal bleeding with hemorrhoids. I treated her last year with suppositories last year with good results.  Review of Systems  Constitutional: Negative.   HENT: Negative.   Respiratory: Negative.   Cardiovascular:       Hyperlipidemia  Gastrointestinal:       GERD. Gastritis. Irritable bowel.  Genitourinary:       Interstitial cystitis  Musculoskeletal:       Fibromyalgia  Skin: Negative.   Neurological: Negative.   Hematological: Negative.   Psychiatric/Behavioral: Negative.        Objective:   Physical ExamPhysical examination: HEENT within normal limits. Neck: Thyroid not large. No masses. Supraclavicular nodes: not enlarged. Breasts: Examined in both sitting midline position. No skin changes and no masses. Abdomen: Soft no guarding rebound or masses or hernia. Pelvic: External: Within normal limits. BUS: Within normal limits. Vaginal:within normal limits. Good estrogen effect. No evidence of cystocele rectocele or enterocele. Cervix: clean. Uterus: Normal size and shape. Adnexa: No masses. Rectovaginal exam: Confirmatory and negative. Extremities: Within normal limits.     Assessment:     #1. Yeast vaginitis #2. Menopausal symptoms #3. Atrophic vaginitis #4. Osteopenia   #5. Rectal bleeding with hemorrhoids Plan:     discussed with the patient risks with HRT including increased risk of breast cancer DVT and stroke. She would like to continue and we will continue her on them heart 1 mg daily. She will continue Estrace vaginal cream. She was treated with terconazole 3 cream for her yeast infection. She will continue yearly mammograms. She will get followup bone density. She will call Dr. polite to be sure she doesn't need a followup colonoscopy because of the rectal bleeding. In the meantime she will start Anusol HC Suppository's 25 mg 1 twice a day for 1 week and then one daily for 1 week.

## 2011-04-09 ENCOUNTER — Other Ambulatory Visit: Payer: Self-pay | Admitting: Obstetrics and Gynecology

## 2011-06-11 ENCOUNTER — Telehealth: Payer: Self-pay | Admitting: *Deleted

## 2011-06-11 NOTE — Telephone Encounter (Signed)
Pt's Femhrt is on manufacture back order and the generic is unavailable too. Can you give the patient an alternative? Pls advise

## 2011-06-12 MED ORDER — ESTRADIOL-NORETHINDRONE ACET 1-0.5 MG PO TABS: 1.0000 | ORAL_TABLET | Freq: Every day | ORAL | Status: DC

## 2011-06-12 NOTE — Telephone Encounter (Signed)
Pt informed and generic is ok.rx sent

## 2011-06-12 NOTE — Telephone Encounter (Signed)
activella 1mg m. Can use brand or generic-pt choice.

## 2011-09-12 DIAGNOSIS — J45909 Unspecified asthma, uncomplicated: Secondary | ICD-10-CM | POA: Insufficient documentation

## 2011-10-02 ENCOUNTER — Other Ambulatory Visit: Payer: Self-pay | Admitting: Obstetrics and Gynecology

## 2011-10-22 ENCOUNTER — Other Ambulatory Visit: Payer: Self-pay | Admitting: *Deleted

## 2011-10-22 MED ORDER — VALACYCLOVIR HCL 500 MG PO TABS: 500.0000 mg | ORAL_TABLET | Freq: Two times a day (BID) | ORAL | Status: AC

## 2012-01-01 ENCOUNTER — Telehealth: Payer: Self-pay | Admitting: *Deleted

## 2012-01-01 ENCOUNTER — Other Ambulatory Visit: Payer: Self-pay | Admitting: Obstetrics and Gynecology

## 2012-01-01 DIAGNOSIS — M8580 Other specified disorders of bone density and structure, unspecified site: Secondary | ICD-10-CM

## 2012-01-01 DIAGNOSIS — Z1231 Encounter for screening mammogram for malignant neoplasm of breast: Secondary | ICD-10-CM

## 2012-01-01 NOTE — Telephone Encounter (Signed)
Pt called requesting bone density order placed at [facility] center, order placed 

## 2012-01-15 ENCOUNTER — Ambulatory Visit: Payer: Medicare Other

## 2012-01-15 ENCOUNTER — Ambulatory Visit
Admission: RE | Admit: 2012-01-15 | Discharge: 2012-01-15 | Disposition: A | Discharge status: Home / Self Care | Payer: Medicare Other | Source: Ambulatory Visit | Attending: Obstetrics and Gynecology | Admitting: Obstetrics and Gynecology

## 2012-01-15 DIAGNOSIS — M8580 Other specified disorders of bone density and structure, unspecified site: Secondary | ICD-10-CM

## 2012-01-15 DIAGNOSIS — Z1231 Encounter for screening mammogram for malignant neoplasm of breast: Secondary | ICD-10-CM

## 2012-01-23 ENCOUNTER — Encounter: Payer: Self-pay | Admitting: Obstetrics and Gynecology

## 2012-02-04 ENCOUNTER — Encounter: Payer: Medicare Other | Admitting: Obstetrics and Gynecology

## 2012-02-06 ENCOUNTER — Ambulatory Visit (INDEPENDENT_AMBULATORY_CARE_PROVIDER_SITE_OTHER): Payer: Medicare Other | Admitting: Obstetrics and Gynecology

## 2012-02-06 ENCOUNTER — Encounter: Payer: Self-pay | Admitting: Obstetrics and Gynecology

## 2012-02-06 ENCOUNTER — Other Ambulatory Visit (HOSPITAL_COMMUNITY)
Admission: RE | Admit: 2012-02-06 | Discharge: 2012-02-06 | Disposition: A | Discharge status: Home / Self Care | Payer: Medicare Other | Source: Ambulatory Visit | Attending: Obstetrics and Gynecology | Admitting: Obstetrics and Gynecology

## 2012-02-06 VITALS — BP 124/78 | Ht 63.0 in | Wt 178.0 lb

## 2012-02-06 DIAGNOSIS — R102 Pelvic and perineal pain: Secondary | ICD-10-CM

## 2012-02-06 DIAGNOSIS — B373 Candidiasis of vulva and vagina: Secondary | ICD-10-CM

## 2012-02-06 DIAGNOSIS — Z780 Asymptomatic menopausal state: Secondary | ICD-10-CM

## 2012-02-06 DIAGNOSIS — K649 Unspecified hemorrhoids: Secondary | ICD-10-CM

## 2012-02-06 DIAGNOSIS — Z124 Encounter for screening for malignant neoplasm of cervix: Secondary | ICD-10-CM | POA: Insufficient documentation

## 2012-02-06 DIAGNOSIS — A499 Bacterial infection, unspecified: Secondary | ICD-10-CM

## 2012-02-06 DIAGNOSIS — N760 Acute vaginitis: Secondary | ICD-10-CM

## 2012-02-06 DIAGNOSIS — L299 Pruritus, unspecified: Secondary | ICD-10-CM

## 2012-02-06 MED ORDER — METRONIDAZOLE 0.75 % VA GEL: Freq: Every morning | VAGINAL | Status: DC

## 2012-02-06 MED ORDER — VALACYCLOVIR HCL 1 G PO TABS: 1000.0000 mg | ORAL_TABLET | Freq: Every day | ORAL | Status: DC

## 2012-02-06 MED ORDER — SULFAMETHOXAZOLE-TRIMETHOPRIM 800-160 MG PO TABS: 1.0000 | ORAL_TABLET | Freq: Two times a day (BID) | ORAL | Status: DC

## 2012-02-06 MED ORDER — TERCONAZOLE 0.4 % VA CREA: 1.0000 | TOPICAL_CREAM | Freq: Every day | VAGINAL | Status: DC

## 2012-02-06 MED ORDER — CLOTRIMAZOLE-BETAMETHASONE 1-0.05 % EX CREA: TOPICAL_CREAM | Freq: Two times a day (BID) | CUTANEOUS | Status: DC

## 2012-02-06 NOTE — Progress Notes (Signed)
Patient came to see me today for further follow up. She has had  significant vasomotor symptoms. The HRT that  worked the best for her was femhrt 1 mg. When that became unavailable we switched her to Activella 1 mg. She didn't think it worked quite as well and she had mastodynia with it. When we lowered her dose to half milligram she had hot flashes. She is back on the 1 mg daily with mastodynia. She is up-to-date on mammograms. She is having no vaginal bleeding. She is having no pelvic pain. Her bone density this year continued to show osteoporosis. In addition she's lost 10% more bone in her hip from last bone density. She once took Actonel and had terrible joint pain.she is coming close to the point of needing dental implants. Her rheumatologist who she sees for fibromyalgia suggest that she not be treated for osteoporosis until she has her implants. As of now she has no particular date for surgery. She continues to get recurrent vulvovaginitis and we have treated her with all yeast medication available. She only gets temporary relief from anything. She has tried medication keep the pH of the vagina acidic. She also gets occasional bacterial vaginosis. The medication works best for the yeast is terconazole 7 cream. She is also used Lotrisone cream for skin itching and irritation with good results. She is noticing continuing abdominal bloating with weight gain. She does her lab with her PCP. She uses Valtrex when she gets genital herpetic reoccurrences which is not often. She has always had normal Pap smears. Her last Pap smear was 2011.  ROS: 12 system review done. Pertinent positives above. Other positives include fibromyalgia, hypothyroidism, peripheral neuropathy, hypertension, GERD, interstitial cystitis and insomnia.  Physical examination: HEENT within normal limits. Neck: Thyroid not large. No masses. Supraclavicular nodes: not enlarged. Breasts: Examined in both sitting and lying  position. No skin  changes and no masses. Abdomen: Soft no guarding rebound or masses or hernia. Pelvic: External: Within normal limits. BUS: Within normal limits. Vaginal:within normal limits. Good estrogen effect. No evidence of cystocele rectocele or enterocele. Cervix: clean. Uterus: Normal size and shape. Adnexa: No masses. Rectovaginal exam: Confirmatory and negative. Extremities: Within normal limits.  Assessment: #1. Menopausal symptoms #2. Osteoporosis #3. Hemorrhoids #4. Recurrent vaginitis #5. Skin pruritus from yeast infection #6. Mastodynia #7. HSV  Plan: Patient will see if femhrt now available. If it is we will prescribe. If it is not she will stay on Activella 1 mg but just do 25 days a month to prevent mastodynia. She will continue yearly mammograms. We discussed Prolia for treatment of her osteoporosis. She will decide. We discussed side effects. She will discuss with her oral surgeon timing of dental implants with Prolia. We refilled Valtrex, terconazole 7 cream, MetroGel vaginal cream, Lotrisone cream.The new Pap smear guidelines were discussed with the patient. Patient requested past and it was done. Pelvic ultrasound scheduled.she will go back and discuss banding of her hemorrhoids with Dr. Derrell Lolling. Face-to-face time was greater than 50 minutes.

## 2012-02-06 NOTE — Patient Instructions (Addendum)
Schedule pelvic ultrasound

## 2012-02-11 ENCOUNTER — Ambulatory Visit (INDEPENDENT_AMBULATORY_CARE_PROVIDER_SITE_OTHER): Payer: Medicare Other | Admitting: Obstetrics and Gynecology

## 2012-02-11 ENCOUNTER — Other Ambulatory Visit: Payer: Medicare Other

## 2012-02-11 DIAGNOSIS — R140 Abdominal distension (gaseous): Secondary | ICD-10-CM

## 2012-02-11 DIAGNOSIS — D251 Intramural leiomyoma of uterus: Secondary | ICD-10-CM

## 2012-02-11 DIAGNOSIS — D259 Leiomyoma of uterus, unspecified: Secondary | ICD-10-CM

## 2012-02-11 DIAGNOSIS — D252 Subserosal leiomyoma of uterus: Secondary | ICD-10-CM

## 2012-02-11 DIAGNOSIS — N83339 Acquired atrophy of ovary and fallopian tube, unspecified side: Secondary | ICD-10-CM

## 2012-02-11 DIAGNOSIS — Z780 Asymptomatic menopausal state: Secondary | ICD-10-CM

## 2012-02-11 DIAGNOSIS — R141 Gas pain: Secondary | ICD-10-CM

## 2012-02-11 DIAGNOSIS — R102 Pelvic and perineal pain: Secondary | ICD-10-CM

## 2012-02-11 MED ORDER — NORETHINDRONE-ETH ESTRADIOL 1-5 MG-MCG PO TABS: 1.0000 | ORAL_TABLET | Freq: Every day | ORAL | Status: DC

## 2012-02-11 NOTE — Progress Notes (Signed)
Patient came to see me today for an ultrasound because of abdominal bloating and lower abdominal discomfort. On ultrasound the patient has a normal uterus except for 2 small fibroids that are stable. Her endometrial lining is thin at 2.2 mm. Both ovaries can distinctly be seen and  are atrophic. Lateral to her left ovary there is a focal area that looks like bowel with stool in it and as the ultrasound continued it would thin out a little bit. It is not  an adnexal mass. There is no cul-de-sac fluid. The patient was reassured. She found out that she can get  generic femhrt 1mg . We called her in [place] 1mg . We discussed tsec (estrogen plus serm) which just got FDA approval and will be available next year.

## 2012-06-17 ENCOUNTER — Other Ambulatory Visit: Payer: Self-pay | Admitting: Obstetrics and Gynecology

## 2013-02-06 ENCOUNTER — Encounter: Payer: Self-pay | Admitting: Gynecology

## 2013-02-06 ENCOUNTER — Ambulatory Visit (INDEPENDENT_AMBULATORY_CARE_PROVIDER_SITE_OTHER): Payer: Medicare Other | Admitting: Gynecology

## 2013-02-06 VITALS — BP 140/86 | Ht 63.0 in | Wt 185.0 lb

## 2013-02-06 DIAGNOSIS — N898 Other specified noninflammatory disorders of vagina: Secondary | ICD-10-CM

## 2013-02-06 DIAGNOSIS — Z8639 Personal history of other endocrine, nutritional and metabolic disease: Secondary | ICD-10-CM

## 2013-02-06 DIAGNOSIS — N952 Postmenopausal atrophic vaginitis: Secondary | ICD-10-CM

## 2013-02-06 DIAGNOSIS — M810 Age-related osteoporosis without current pathological fracture: Secondary | ICD-10-CM

## 2013-02-06 LAB — WET PREP FOR TRICH, YEAST, CLUE
Trich, Wet Prep: NONE SEEN
Yeast Wet Prep HPF POC: NONE SEEN

## 2013-02-06 MED ORDER — NORETHINDRONE-ETH ESTRADIOL 1-5 MG-MCG PO TABS: 1.0000 | ORAL_TABLET | Freq: Every day | ORAL | Status: DC

## 2013-02-06 MED ORDER — METRONIDAZOLE 0.75 % VA GEL: Freq: Every morning | VAGINAL | Status: DC

## 2013-02-06 MED ORDER — SULFAMETHOXAZOLE-TRIMETHOPRIM 800-160 MG PO TABS: 1.0000 | ORAL_TABLET | Freq: Two times a day (BID) | ORAL | Status: DC

## 2013-02-06 MED ORDER — CALCITONIN (SALMON) 200 UNIT/ACT NA SOLN: 1.0000 | Freq: Every day | NASAL | Status: DC

## 2013-02-06 MED ORDER — TERCONAZOLE 0.4 % VA CREA: 1.0000 | TOPICAL_CREAM | Freq: Every day | VAGINAL | Status: DC

## 2013-02-06 NOTE — Addendum Note (Signed)
Addended by: Ok Edwards on: 02/06/2013 03:09 PM   Modules accepted: Orders

## 2013-02-06 NOTE — Patient Instructions (Signed)
Calcitonin nasal spray What is this medicine? CALCITONIN (kal si TOE nin) stops bone loss and breaks. It will make your bones more dense, or thicker. This medicine is used to treat postmenopausal osteoporosis. This medicine may be used for other purposes; ask your health care provider or pharmacist if you have questions. What should I tell my health care provider before I take this medicine? They need to know if you have any of these conditions: -nasal sores or trauma -an unusual or allergic reaction to calcitonin, fish, other medicines, foods, dyes, or preservatives -pregnant or trying to get pregnant -breast-feeding How should I use this medicine? This medicine is only for use in the nose. Do not take by mouth. Follow the directions on your prescription label. Alternate nostrils daily. Do not use more often than directed. Make sure that you are using your nasal spray correctly. Ask you doctor or health care provider if you have any questions. Talk to your pediatrician regarding the use of this medicine in children. Special care may be needed. Overdosage: If you think you have taken too much of this medicine contact a poison control center or emergency room at once. NOTE: This medicine is only for you. Do not share this medicine with others. What if I miss a dose? If you miss a dose, use it as soon as you can. If it is almost time for your next dose, use only that dose. Do not use double or extra doses. What may interact with this medicine? -prior bone scan with diphosphonate in patient with Paget's disease This list may not describe all possible interactions. Give your health care provider a list of all the medicines, herbs, non-prescription drugs, or dietary supplements you use. Also tell them if you smoke, drink alcohol, or use illegal drugs. Some items may interact with your medicine. What should I watch for while using this medicine? Visit your doctor or health care professional for  regular checks on your progress. Your doctor will need to exam the inside of your nose before you start using the medicine and periodically during use. Be sure that you get at least 1000 mg of calcium and 400 I.U. of vitamin D while you are taking this medicine. This will help the medicine work better. Ask your doctor or health care provider for good nutrition advice. What side effects may I notice from receiving this medicine? Side effects that you should report to your doctor or health care professional as soon as possible: -allergic reactions like skin rash, itching or hives, swelling of the face, lips, or tongue -breathing problems -chest pain, tightness -dizziness -infection or fever -nosebleed or nose sores -tingling in the hands or feet Side effects that usually do not require medical attention (report to your doctor or health care professional if they continue or are bothersome): -back or joint pain -flushing -headache -nausea or upset stomach -runny, stuffy, or irritated nose -tremors -watery eyes This list may not describe all possible side effects. Call your doctor for medical advice about side effects. You may report side effects to FDA at 1-800-FDA-1088. Where should I keep my medicine? Keep out of the reach of children. Store the unopened medicine in a refrigerator between 2 and 8 degrees C (36 and 46 degrees F). Do not freeze. Once opened store at room temperature between 15 and 30 degrees C (59 and 86 degrees F) in an upright position for up to 35 days. Throw away any unused medicine after the expiration date. NOTE: This sheet  is a summary. It may not cover all possible information. If you have questions about this medicine, talk to your doctor, pharmacist, or health care provider.  2013, [employer]. (08/22/2010 11:54:02 AM)

## 2013-02-06 NOTE — Progress Notes (Addendum)
[name] April 29, 1946 [id number]   History:    67 y.o.  With complaining of vaginal dryness and irritation. Patient with history of osteoporosis and past history vitamin D deficiency. Patient had been seen by my partner order an ultrasound 02/11/2012 because of complaints of abdominal bloating and lower abdominal discomfort. The ultrasound demonstrated a normal uterus with the exception of 2 small fibroids that were stable. And her endometrial stripe was thin at 2.2 mm and both ovaries were atrophic. Patient has been on the generic Jintelli 1 mg as a form of HRT. She is having no vaginal bleeding. She is having no pelvic pain. Her bone density this year continued to show osteoporosis. In addition she's lost 10% more bone in her hip from last bone density. She once took Actonel and had terrible joint pain.she is coming close to the point of needing dental implants. Her rheumatologist who she sees for fibromyalgia suggest that she not be treated for osteoporosis until she has her implants. She has had several implants and is still scheduled for more. Her PCP does her lab work. (Polite) patient with no past history of abnormal Pap smear. Patient is using Estrace vaginal cream twice a week for vaginal atrophy. Patient's vaccinations are all up-to-date and recently had a flu vaccine. Patient last bone density study in 2013 with a piece of bone value was 2.5. Patient takes vitamin D 50,000 units every other week. Last colonoscopy 2005. Mammogram 2013 normal.     Past medical history,surgical history, family history and social history were all reviewed and documented in the EPIC chart.  Gynecologic History No LMP recorded. Patient is postmenopausal. Contraception: post menopausal status Last Pap: 2013. Results were: normal Last mammogram: 2013. Results were: normal  Obstetric History OB History  Gravida Para Term Preterm AB SAB TAB Ectopic Multiple Living  2 2 2       2     # Outcome Date GA Lbr  Len/2nd Weight Sex Delivery Anes PTL Lv  2 TRM           1 TRM                ROS: A ROS was performed and pertinent positives and negatives are included in the history.  GENERAL: No fevers or chills. HEENT: No change in vision, no earache, sore throat or sinus congestion. NECK: No pain or stiffness. CARDIOVASCULAR: No chest pain or pressure. No palpitations. PULMONARY: No shortness of breath, cough or wheeze. GASTROINTESTINAL: No abdominal pain, nausea, vomiting or diarrhea, melena or bright red blood per rectum. GENITOURINARY: No urinary frequency, urgency, hesitancy or dysuria. MUSCULOSKELETAL: No joint or muscle pain, no back pain, no recent trauma. DERMATOLOGIC: No rash, no itching, no lesions. ENDOCRINE: No polyuria, polydipsia, no heat or cold intolerance. No recent change in weight. HEMATOLOGICAL: No anemia or easy bruising or bleeding. NEUROLOGIC: No headache, seizures, numbness, tingling or weakness. PSYCHIATRIC: No depression, no loss of interest in normal activity or change in sleep pattern.     Exam: chaperone present  BP 140/86  Ht 5\' 3"  (1.6 m)  Wt 185 lb (83.915 kg)  BMI 32.78 kg/m2  Body mass index is 32.78 kg/(m^2).  General appearance : Well developed well nourished female. No acute distress HEENT: Neck supple, trachea midline, no carotid bruits, no thyroidmegaly Lungs: Clear to auscultation, no rhonchi or wheezes, or rib retractions  Heart: Regular rate and rhythm, no murmurs or gallops Breast:Examined in sitting and supine position were symmetrical in appearance, no palpable  masses or tenderness,  no skin retraction, no nipple inversion, no nipple discharge, no skin discoloration, no axillary or supraclavicular lymphadenopathy Abdomen: no palpable masses or tenderness, no rebound or guarding Extremities: no edema or skin discoloration or tenderness  Pelvic:  Bartholin, Urethra, Skene Glands: Within normal limits             Vagina: No gross lesions or discharge,  vaginal atrophy  Cervix: No gross lesions or discharge  Uterus  anteverted, normal size, shape and consistency, non-tender and mobile  Adnexa  Without masses or tenderness  Anus and perineum  normal   Rectovaginal  normal sphincter tone without palpated masses or tenderness             Hemoccult cords provided     Assessment/Plan:  67 y.o. female for annual exam with history of osteoporosis. Patient with several dental implants not completely dental work. He discussed alternative options for osteoporosis she will be placed on Miacalcin (calcitonin/salmon) to 200 units/ACT nasal spray and 1 no slowly daily. Risks benefits and pros and cons were discussed. Patient with past history of recurrent vulvovaginitis uses Terazol 7 when necessary and MetroGel interchangeably. She also uses refresh probiotic gel as well. We discussed importance of calcium and vitamin D for osteoporosis prevention as was regular exercise. She will schedule a bone density study here in the office in the next few days. We'll check her vitamin D level today. Pap smear was not done in accordance to the new guidelines. She was reminded to schedule her mammogram as well. Hemoccult cards were presented for her to submit to the office for testing at a later date. Vaginal wet prep was negative today.  Note: This dictation was prepared with  Dragon/digital dictation along withSmart phrase technology. Any transcriptional errors that result from this process are unintentional.   Ok Edwards MD, 2:49 PM 02/06/2013

## 2013-02-07 LAB — VITAMIN D 25 HYDROXY (VIT D DEFICIENCY, FRACTURES): Vit D, 25-Hydroxy: 58 ng/mL (ref 30–89)

## 2013-03-27 ENCOUNTER — Other Ambulatory Visit: Payer: Self-pay | Admitting: Obstetrics and Gynecology

## 2013-06-26 ENCOUNTER — Other Ambulatory Visit: Payer: Self-pay | Admitting: Gynecology

## 2013-06-26 NOTE — Telephone Encounter (Signed)
Please call a prescription for vitamin D 50,000 units to take 1 every other week prescribed 2 tablets with 11 refills

## 2013-06-29 ENCOUNTER — Telehealth: Payer: Self-pay | Admitting: *Deleted

## 2013-06-29 NOTE — Telephone Encounter (Signed)
Prior authoriztion for [name] 1 mg was filled out and faxed to [employer] wait for response.

## 2013-06-30 ENCOUNTER — Encounter: Payer: Self-pay | Admitting: *Deleted

## 2013-07-21 NOTE — Telephone Encounter (Signed)
Jinteli 1 mg approved 07/03/2014 by [employer].

## 2013-09-15 ENCOUNTER — Other Ambulatory Visit: Payer: Self-pay

## 2013-09-15 DIAGNOSIS — Z1231 Encounter for screening mammogram for malignant neoplasm of breast: Secondary | ICD-10-CM

## 2013-09-23 ENCOUNTER — Ambulatory Visit: Payer: Medicare Other

## 2014-01-01 ENCOUNTER — Other Ambulatory Visit: Payer: Self-pay | Admitting: Gastroenterology

## 2014-02-09 ENCOUNTER — Encounter: Payer: Medicare Other | Admitting: Gynecology

## 2014-03-01 ENCOUNTER — Encounter: Payer: Self-pay | Admitting: Gynecology

## 2014-03-15 ENCOUNTER — Other Ambulatory Visit: Payer: Self-pay | Admitting: Gynecology

## 2014-03-17 ENCOUNTER — Encounter: Payer: Medicare Other | Admitting: Gynecology

## 2014-03-18 ENCOUNTER — Ambulatory Visit (INDEPENDENT_AMBULATORY_CARE_PROVIDER_SITE_OTHER): Payer: Medicare Other | Admitting: Gynecology

## 2014-03-18 ENCOUNTER — Encounter: Payer: Self-pay | Admitting: Gynecology

## 2014-03-18 VITALS — BP 126/88 | Ht 63.5 in | Wt 181.0 lb

## 2014-03-18 DIAGNOSIS — B373 Candidiasis of vulva and vagina: Secondary | ICD-10-CM

## 2014-03-18 DIAGNOSIS — N952 Postmenopausal atrophic vaginitis: Secondary | ICD-10-CM

## 2014-03-18 DIAGNOSIS — Z79890 Hormone replacement therapy (postmenopausal): Secondary | ICD-10-CM

## 2014-03-18 DIAGNOSIS — N898 Other specified noninflammatory disorders of vagina: Secondary | ICD-10-CM

## 2014-03-18 DIAGNOSIS — B3731 Acute candidiasis of vulva and vagina: Secondary | ICD-10-CM

## 2014-03-18 DIAGNOSIS — M810 Age-related osteoporosis without current pathological fracture: Secondary | ICD-10-CM

## 2014-03-18 LAB — WET PREP FOR TRICH, YEAST, CLUE
CLUE CELLS WET PREP: NONE SEEN
Trich, Wet Prep: NONE SEEN

## 2014-03-18 MED ORDER — CIPROFLOXACIN HCL 500 MG PO TABS: 500.0000 mg | ORAL_TABLET | Freq: Two times a day (BID) | ORAL | Status: DC

## 2014-03-18 MED ORDER — METRONIDAZOLE 0.75 % VA GEL: VAGINAL | Status: DC

## 2014-03-18 MED ORDER — TERCONAZOLE 0.4 % VA CREA: 1.0000 | TOPICAL_CREAM | Freq: Every day | VAGINAL | Status: DC

## 2014-03-18 MED ORDER — HYDROCORTISONE ACETATE 25 MG RE SUPP: 25.0000 mg | Freq: Two times a day (BID) | RECTAL | Status: DC | PRN

## 2014-03-18 MED ORDER — NORETHINDRONE-ETH ESTRADIOL 1-5 MG-MCG PO TABS: 1.0000 | ORAL_TABLET | Freq: Every day | ORAL | Status: DC

## 2014-03-18 MED ORDER — MICONAZOLE NITRATE 1200 & 2 MG & % VA KIT: 1.0000 | PACK | Freq: Once | VAGINAL | Status: DC

## 2014-03-18 MED ORDER — VITAMIN D (ERGOCALCIFEROL) 1.25 MG (50000 UNIT) PO CAPS: 50000.0000 [IU] | ORAL_CAPSULE | ORAL | Status: DC

## 2014-03-18 MED ORDER — CALCITONIN (SALMON) 200 UNIT/ACT NA SOLN: 1.0000 | Freq: Every day | NASAL | Status: DC

## 2014-03-18 NOTE — Addendum Note (Signed)
Addended by: Terrance Mass on: 03/18/2014 03:43 PM   Modules accepted: Orders

## 2014-03-18 NOTE — Patient Instructions (Signed)
Shingles Vaccine What You Need to Know WHAT IS SHINGLES?  Shingles is a painful skin rash, often with blisters. It is also called Herpes Zoster or just Zoster.  A shingles rash usually appears on one side of the face or body and lasts from 2 to 4 weeks. Its main symptom is pain, which can be quite severe. Other symptoms of shingles can include fever, headache, chills, and upset stomach. Very rarely, a shingles infection can lead to pneumonia, hearing problems, blindness, brain inflammation (encephalitis), or death.  For about 1 person in 5, severe pain can continue even after the rash clears up. This is called post-herpetic neuralgia.  Shingles is caused by the Varicella Zoster virus. This is the same virus that causes chickenpox. Only someone who has had a case of chickenpox or rarely, has gotten chickenpox vaccine, can get shingles. The virus stays in your body. It can reappear many years later to cause a case of shingles.  You cannot catch shingles from another person with shingles. However, a person who has never had chickenpox (or chickenpox vaccine) could get chickenpox from someone with shingles. This is not very common.  Shingles is far more common in people 50 and older than in younger people. It is also more common in people whose immune systems are weakened because of a disease such as cancer or drugs such as steroids or chemotherapy.  At least 1 million people get shingles per year in the [place]. SHINGLES VACCINE  A vaccine for shingles was licensed in 2006. In clinical trials, the vaccine reduced the risk of shingles by 50%. It can also reduce the pain in people who still get shingles after being vaccinated.  A single dose of shingles vaccine is recommended for adults 60 years of age and older. SOME PEOPLE SHOULD NOT GET SHINGLES VACCINE OR SHOULD WAIT A person should not get shingles vaccine if he or she:  Has ever had a life-threatening allergic reaction to gelatin, the  antibiotic neomycin, or any other component of shingles vaccine. Tell your caregiver if you have any severe allergies.  Has a weakened immune system because of current:  AIDS or another disease that affects the immune system.  Treatment with drugs that affect the immune system, such as prolonged use of high-dose steroids.  Cancer treatment, such as radiation or chemotherapy.  Cancer affecting the bone marrow or lymphatic system, such as leukemia or lymphoma.  Is pregnant, or might be pregnant. Women should not become pregnant until at least 4 weeks after getting shingles vaccine. Someone with a minor illness, such as a cold, may be vaccinated. Anyone with a moderate or severe acute illness should usually wait until he or she recovers before getting the vaccine. This includes anyone with a temperature of 101.3 F (38 C) or higher. WHAT ARE THE RISKS FROM SHINGLES VACCINE?  A vaccine, like any medicine, could possibly cause serious problems, such as severe allergic reactions. However, the risk of a vaccine causing serious harm, or death, is extremely small.  No serious problems have been identified with shingles vaccine. Mild Problems  Redness, soreness, swelling, or itching at the site of the injection (about 1 person in 3).  Headache (about 1 person in 70). Like all vaccines, shingles vaccine is being closely monitored for unusual or severe problems. WHAT IF THERE IS A MODERATE OR SEVERE REACTION? What should I look for? Any unusual condition, such as a severe allergic reaction or a high fever. If a severe allergic reaction   occurred, it would be within a few minutes to an hour after the shot. Signs of a serious allergic reaction can include difficulty breathing, weakness, hoarseness or wheezing, a fast heartbeat, hives, dizziness, paleness, or swelling of the throat. What should I do?  Call your caregiver, or get the person to a caregiver right away.  Tell the caregiver what  happened, the date and time it happened, and when the vaccination was given.  Ask the caregiver to report the reaction by filing a Vaccine Adverse Event Reporting System (VAERS) form. Or, you can file this report through the VAERS web site at www.vaers.hhs.gov or by calling 1-800-822-7967. VAERS does not provide medical advice. HOW CAN I LEARN MORE?  Ask your caregiver. He or she can give you the vaccine package insert or suggest other sources of information.  Contact the Centers for Disease Control and Prevention (CDC):  Call 1-800-232-4636 (1-800-CDC-INFO).  Visit the CDC website at www.cdc.gov/vaccines CDC Shingles Vaccine VIS (02/03/08) Document Released: 02/11/2006 Document Revised: 07/09/2011 Document Reviewed: 08/06/2012 Exit[employer] Patient Information 2015 [employer]. This information is not intended to replace advice given to you by your health care provider. Make sure you discuss any questions you have with your health care provider.  

## 2014-03-18 NOTE — Progress Notes (Addendum)
[name] August 21, 1945 [id number]   History:    68 y.o.  for GYN exam and follow-up. Patient complaining of vulvar irritation and pruritus. Patient with history of osteoporosis (AP spine T score -2.5) and past history vitamin D deficiency. Patient had been seen by my partner order an ultrasound 02/11/2012 because of complaints of abdominal bloating and lower abdominal discomfort. The ultrasound demonstrated a normal uterus with the exception of 2 small fibroids that were stable. And her endometrial stripe was thin at 2.2 mm and both ovaries were atrophic. Patient has been on the generic Jintelli 1 mg as a form of HRT. She is having no vaginal bleeding. She is having no pelvic pain. Her bone density in 2013 showed osteoporosis.She once took Actonel and had terrible joint pain.she is coming close to the point of needing dental implants.Miacalcin (calcitonin/salmon) to 200 units/ACT nasal spray  To one nostril daily.She was started on Her rheumatologist who she sees for fibromyalgia suggest that she not be treated for osteoporosis until she has her implants. She has had several implants and is still scheduled for more. Her PCP does her lab work. (Polite) patient with no past history of abnormal Pap smear. Patient is using Estrace vaginal cream twice a week for vaginal atrophy. Patient's vaccinations are all up-to-date and recently had a flu vaccine. Patient last bone density study in 2013 with a piece of bone value was 2.5. Patient takes vitamin D 50,000 units every other week. Colonoscopy 2015 normal.. Mammogram 2013 normal.   Past medical history,surgical history, family history and social history were all reviewed and documented in the EPIC chart.  Gynecologic History No LMP recorded. Patient is postmenopausal. Contraception: post menopausal status Last Pap: 2013. Results were: normal Last mammogram: 2013. Results were: normal  Obstetric History OB History  Gravida Para Term Preterm AB SAB  TAB Ectopic Multiple Living  2 2 2       2     # Outcome Date GA Lbr Len/2nd Weight Sex Delivery Anes PTL Lv  2 Term           1 Term                ROS: A ROS was performed and pertinent positives and negatives are included in the history.  GENERAL: No fevers or chills. HEENT: No change in vision, no earache, sore throat or sinus congestion. NECK: No pain or stiffness. CARDIOVASCULAR: No chest pain or pressure. No palpitations. PULMONARY: No shortness of breath, cough or wheeze. GASTROINTESTINAL: No abdominal pain, nausea, vomiting or diarrhea, melena or bright red blood per rectum. GENITOURINARY: No urinary frequency, urgency, hesitancy or dysuria. MUSCULOSKELETAL: No joint or muscle pain, no back pain, no recent trauma. DERMATOLOGIC: No rash, no itching, no lesions. ENDOCRINE: No polyuria, polydipsia, no heat or cold intolerance. No recent change in weight. HEMATOLOGICAL: No anemia or easy bruising or bleeding. NEUROLOGIC: No headache, seizures, numbness, tingling or weakness. PSYCHIATRIC: No depression, no loss of interest in normal activity or change in sleep pattern.     Exam: chaperone present  BP 126/88 mmHg  Ht 5' 3.5" (1.613 m)  Wt 181 lb (82.101 kg)  BMI 31.56 kg/m2  Body mass index is 31.56 kg/(m^2).  General appearance : Well developed well nourished female. No acute distress HEENT: Neck supple, trachea midline, no carotid bruits, no thyroidmegaly Lungs: Clear to auscultation, no rhonchi or wheezes, or rib retractions  Heart: Regular rate and rhythm, no murmurs or gallops Breast:Examined in sitting and  supine position were symmetrical in appearance, no palpable masses or tenderness,  no skin retraction, no nipple inversion, no nipple discharge, no skin discoloration, no axillary or supraclavicular lymphadenopathy Abdomen: no palpable masses or tenderness, no rebound or guarding Extremities: no edema or skin discoloration or tenderness  Pelvic:  Bartholin, Urethra, Skene  Glands: Within normal limits             Vagina: No gross lesions or discharge, atrophic changes  Cervix: No gross lesions or discharge  Uterus  anteverted, normal size, shape and consistency, non-tender and mobile  Adnexa  Without masses or tenderness  Anus and perineum  normal   Rectovaginal  normal sphincter tone without palpated masses or tenderness             Hemoccult PCP provides   Wet prep yeast  Assessment/Plan:  68 y.o. female with history of osteoporosis tolerating Miacalcin (calcitonin/salmon) to 200 units/ACT nasal spray daily. Patient will be schedule her bone density study for follow-up and she's also due for her mammogram as well. Pap smear no longer needed according to the new guidelines. Patient's T Dow been flu vaccine are up-to-date. We discussed importance of calcium vitamin D for osteoporosis prevention. She will be prescribed Monistat 3 to apply intravaginally for her yeast infection.Will check vitamin  D level.  Terrance Mass MD, 2:59 PM 03/18/2014

## 2014-03-18 NOTE — Addendum Note (Signed)
Addended by: Thurnell Garbe A on: 03/18/2014 04:17 PM   Modules accepted: Orders

## 2014-03-19 LAB — VITAMIN D 25 HYDROXY (VIT D DEFICIENCY, FRACTURES): VIT D 25 HYDROXY: 30 ng/mL (ref 30–100)

## 2014-04-18 ENCOUNTER — Emergency Department (HOSPITAL_COMMUNITY)
Admission: EM | Admit: 2014-04-18 | Discharge: 2014-04-18 | Disposition: A | Discharge status: Home / Self Care | Payer: Medicare Other | Attending: Emergency Medicine | Admitting: Emergency Medicine

## 2014-04-18 ENCOUNTER — Encounter (HOSPITAL_COMMUNITY): Payer: Self-pay | Admitting: Emergency Medicine

## 2014-04-18 DIAGNOSIS — H5711 Ocular pain, right eye: Secondary | ICD-10-CM | POA: Insufficient documentation

## 2014-04-18 DIAGNOSIS — Z87448 Personal history of other diseases of urinary system: Secondary | ICD-10-CM | POA: Diagnosis not present

## 2014-04-18 DIAGNOSIS — Z87891 Personal history of nicotine dependence: Secondary | ICD-10-CM | POA: Diagnosis not present

## 2014-04-18 DIAGNOSIS — M810 Age-related osteoporosis without current pathological fracture: Secondary | ICD-10-CM | POA: Diagnosis not present

## 2014-04-18 DIAGNOSIS — Z880 Allergy status to penicillin: Secondary | ICD-10-CM | POA: Insufficient documentation

## 2014-04-18 DIAGNOSIS — Z792 Long term (current) use of antibiotics: Secondary | ICD-10-CM | POA: Insufficient documentation

## 2014-04-18 DIAGNOSIS — M797 Fibromyalgia: Secondary | ICD-10-CM | POA: Diagnosis not present

## 2014-04-18 DIAGNOSIS — J45909 Unspecified asthma, uncomplicated: Secondary | ICD-10-CM | POA: Insufficient documentation

## 2014-04-18 DIAGNOSIS — Z793 Long term (current) use of hormonal contraceptives: Secondary | ICD-10-CM | POA: Diagnosis not present

## 2014-04-18 DIAGNOSIS — H578 Other specified disorders of eye and adnexa: Secondary | ICD-10-CM | POA: Diagnosis present

## 2014-04-18 DIAGNOSIS — E039 Hypothyroidism, unspecified: Secondary | ICD-10-CM | POA: Insufficient documentation

## 2014-04-18 DIAGNOSIS — Z8742 Personal history of other diseases of the female genital tract: Secondary | ICD-10-CM | POA: Diagnosis not present

## 2014-04-18 MED ORDER — TOBRAMYCIN 0.3 % OP SOLN: 1.0000 [drp] | Freq: Four times a day (QID) | OPHTHALMIC | Status: DC

## 2014-04-18 MED ORDER — FLUORESCEIN SODIUM 1 MG OP STRP
1.0000 | ORAL_STRIP | Freq: Once | OPHTHALMIC | Status: AC
  Administered 2014-04-18: 1 via OPHTHALMIC
  Filled 2014-04-18: qty 1

## 2014-04-18 MED ORDER — TETRACAINE HCL 0.5 % OP SOLN
2.0000 [drp] | Freq: Once | OPHTHALMIC | Status: AC
  Administered 2014-04-18: 2 [drp] via OPHTHALMIC
  Filled 2014-04-18: qty 2

## 2014-04-18 NOTE — Discharge Instructions (Signed)
Read the information below.  Use the prescribed medication as directed.  Please discuss all new medications with your pharmacist.  You may return to the Emergency Department at any time for worsening condition or any new symptoms that concern you.      If you develop worsening pain in your eye, change in your vision, swelling around your eye, difficulty moving your eye, or fevers greater than 100.4, see your eye doctor or return to the Emergency Department immediately for a recheck.    °

## 2014-04-18 NOTE — ED Provider Notes (Signed)
CSN: [id number]     Arrival date & time 04/18/14  1215 History  This chart was scribed for Clayton Bibles, PA-C, working with Carmin Muskrat, MD by Steva Colder, ED Scribe. The patient was seen in room WTR8/WTR8 at 1:34 PM.    Chief Complaint  Patient presents with  . Dry Eye    Contact lens in R eye    The history is provided by the patient. No language interpreter was used.   HPI Comments: [name] is a 68 y.o. female who presents to the Emergency Department complaining of dry eye and right eye soreness onset last night. She tried to get her contact out of her right eye and she kept adding her rewetting drops with no aid in getting her contact out. She reports shaking with chills because of the pain. She reports going to [facility] and being referred here to the ED. She does not know for sure if the contact is still in there. She reports that this had happened to her before. The contacts are for daily use only. She states that she is having associated symptoms of eye soreness and dry eye. She denies any other symptoms. Denies change in vision, fevers, URI symptoms.  Denies other eye trauma or foreign body.   Ophthalmologist Dr Ellie Lunch  Past Medical History  Diagnosis Date  . Osteoporosis   . Menopausal symptoms   . Chronic vaginitis   . Fibromyalgia   . Interstitial cystitis   . Thyroid disease     hypothyroid  . Hypoglycemia   . Asthma    Past Surgical History  Procedure Laterality Date  . Nasal sinus surgery      X4  . Bladder hyperextension      X4  . Bunionectomy    . Leg surgery  06/2004    Broken tibia  . Cholecystectomy  01/2006  . Cesarean section      DR. INGRAM  . Mouth surgery     Family History  Problem Relation Age of Onset  . Hypertension Mother   . Heart disease Mother   . Parkinsonism Father   . Heart disease Sister   . Cancer Sister     MELANOMA  . Breast cancer Sister     Age 65   History  Substance Use Topics  . Smoking status: Former  Research scientist (life sciences)  . Smokeless tobacco: Not on file  . Alcohol Use: 0.0 oz/week    0 Not specified per week     Comment: rare   OB History    Gravida Para Term Preterm AB TAB SAB Ectopic Multiple Living   2 2 2       2      Review of Systems  Constitutional: Negative for fever and chills.  Eyes: Positive for pain and redness. Negative for photophobia, discharge, itching and visual disturbance.       Dry eye  Musculoskeletal: Negative for myalgias.  Skin: Negative for color change and wound.  Allergic/Immunologic: Negative for immunocompromised state.  Hematological: Does not bruise/bleed easily.  Psychiatric/Behavioral: Negative for self-injury.  All other systems reviewed and are negative.     Allergies  Fluconazole; Nitrofurantoin; and Penicillins  Home Medications   Prior to Admission medications   Medication Sig Start Date End Date Taking? Authorizing Provider  Almotriptan Malate (AXERT PO) Take by mouth.      Historical Provider, MD  buPROPion (WELLBUTRIN XL) 300 MG 24 hr tablet Take 300 mg by mouth daily.  Historical Provider, MD  calcitonin, salmon, (MIACALCIN/FORTICAL) 200 UNIT/ACT nasal spray Place 1 spray into alternate nostrils daily. 03/18/14   Terrance Mass, MD  Calcium Carbonate-Vitamin D (CALCIUM + D PO) Take by mouth.      Historical Provider, MD  Carisoprodol (SOMA PO) Take by mouth.      Historical Provider, MD  ciprofloxacin (CIPRO) 500 MG tablet Take 1 tablet (500 mg total) by mouth 2 (two) times daily. 03/18/14   Terrance Mass, MD  clotrimazole-betamethasone (LOTRISONE) cream Apply topically 2 (two) times daily. 02/06/12   Bennetta Laos, MD  diazepam (VALIUM) 5 MG tablet Take 5 mg by mouth every 6 (six) hours as needed.      Historical Provider, MD  ergocalciferol (VITAMIN D2) 50000 UNITS capsule  04/09/11   Bennetta Laos, MD  estradiol (ESTRACE) 0.1 MG/GM vaginal cream Use vaginally 3x per weeek 01/31/11   Bennetta Laos, MD   Hydrocodone-Acetaminophen (VICODIN PO) Take by mouth.      Historical Provider, MD  hydrocortisone (ANUSOL-HC) 25 MG suppository Place 1 suppository (25 mg total) rectally 2 (two) times daily as needed for hemorrhoids or itching. 03/18/14   Terrance Mass, MD  HYDROXYZINE HCL PO Take by mouth.      Historical Provider, MD  Levothyroxine Sodium (SYNTHROID PO) Take 137 mcg by mouth.     Historical Provider, MD  Meth-Hyo-M Bl-Na Phos-Ph Sal (URIMAX PO) Take by mouth.      Historical Provider, MD  metroNIDAZOLE (METROGEL VAGINAL) 0.75 % vaginal gel Place vaginally every morning. 02/06/13   Terrance Mass, MD  metroNIDAZOLE (METROGEL VAGINAL) 0.75 % vaginal gel One vaginal application daily 25/85/27   Terrance Mass, MD  miconazole (MONISTAT 1 COMBINATION PACK) kit Place 1 each vaginally once. 03/18/14   Terrance Mass, MD  Multiple Vitamin (MULTIVITAMIN) capsule Take 1 capsule by mouth daily.      Historical Provider, MD  Nabumetone (RELAFEN PO) Take by mouth.      Historical Provider, MD  norethindrone-ethinyl estradiol (JINTELI) 1-5 MG-MCG TABS Take 1 tablet by mouth daily. 03/18/14   Terrance Mass, MD  Pantoprazole Sodium (PROTONIX PO) Take by mouth.      Historical Provider, MD  Sulfamethoxazole-Trimethoprim (BACTRIM DS PO) Take by mouth.    Historical Provider, MD  sulfamethoxazole-trimethoprim (BACTRIM DS,SEPTRA DS) 800-160 MG per tablet Take 1 tablet by mouth 2 (two) times daily. 02/06/13   Terrance Mass, MD  terconazole (TERAZOL 7) 0.4 % vaginal cream Place 1 applicator vaginally at bedtime. 03/18/14   Terrance Mass, MD  Triamterene-HCTZ (DYAZIDE PO) Take by mouth.      Historical Provider, MD  valACYclovir (VALTREX) 1000 MG tablet Take 1 tablet (1,000 mg total) by mouth daily. 02/06/12   Bennetta Laos, MD  Vitamin D, Ergocalciferol, (DRISDOL) 50000 UNITS CAPS capsule Take 1 capsule (50,000 Units total) by mouth once a week. 03/18/14   Terrance Mass, MD  zolpidem  (AMBIEN) 10 MG tablet Take 10 mg by mouth at bedtime as needed.      Historical Provider, MD   BP 151/76 mmHg  Pulse 70  Temp(Src) 97.9 F (36.6 C) (Oral)  Resp 16  SpO2 96%  Physical Exam  Constitutional: She appears well-developed and well-nourished. No distress.  HENT:  Head: Normocephalic and atraumatic.  Eyes: EOM and lids are normal. Pupils are equal, round, and reactive to light. Lids are everted and swept, no foreign bodies found. Right eye exhibits no discharge.  No foreign body present in the right eye. Left eye exhibits no discharge. Right conjunctiva is injected. Left conjunctiva is not injected. No scleral icterus.  Slit lamp exam:      The right eye shows no corneal abrasion, no foreign body and no fluorescein uptake.  Right eye pressure is 12.   Limited fundoscopic view is normal, right eye.    Neck: Neck supple.  Pulmonary/Chest: Effort normal.  Neurological: She is alert.  Skin: She is not diaphoretic.  Nursing note and vitals reviewed.   ED Course  Procedures (including critical care time) DIAGNOSTIC STUDIES: Oxygen Saturation is 96% on room air, normal by my interpretation.    COORDINATION OF CARE: 1:40 PM-Discussed treatment plan which includes visual acuity with pt at bedside and pt agreed to plan.   Labs Review Labs Reviewed - No data to display  Imaging Review No results found.   EKG Interpretation None      MDM   Final diagnoses:  Eye pain, right    Afebrile, nontoxic patient with right eye soreness, dry sensation.  She wore contact lenses last night and doesn't often wear them.  She tried for a long time to get a contact out of her eye that may have already been out.  Despite this, she has no visible corneal abrasions on exam. No foreign body.  Only mild injection over the inferior eye.  No lid edema or e/o cellulitis. Pressure is normal.   D/C home with tobramycin drops given contact lens use and prolonged manipulation of the eye (by the  patient) with close ophthalmology follow up.  Discussed result, findings, treatment, and follow up  with patient.  Pt given return precautions.  Pt verbalizes understanding and agrees with plan.       I personally performed the services described in this documentation, which was scribed in my presence. The recorded information has been reviewed and is accurate.    Clayton Bibles, PA-C 04/18/14 1548  Carmin Muskrat, MD 04/18/14 [phone]

## 2014-04-18 NOTE — ED Notes (Signed)
Pt c/o R eye pain, redness, and dryness since last night. Pt sts she put in a contact in her R eye yesterday at 3pm and was unable to get it out later. Pt sts it could have fallen out. Pt's eye is bloodshot and she c/o dryness. A&Ox4. Pt able to see out of R eye. Pt sts this has happened before.

## 2014-09-06 ENCOUNTER — Telehealth: Payer: Self-pay | Admitting: *Deleted

## 2014-09-06 NOTE — Telephone Encounter (Signed)
Prior Authoization for Jinteli 1 mg done on-line will wait for response.

## 2014-09-08 NOTE — Telephone Encounter (Signed)
Medication approved until 09/06/2015. [id number]

## 2014-10-25 ENCOUNTER — Other Ambulatory Visit: Payer: Self-pay

## 2015-03-21 ENCOUNTER — Encounter: Payer: Self-pay | Admitting: Gynecology

## 2015-03-21 ENCOUNTER — Ambulatory Visit (INDEPENDENT_AMBULATORY_CARE_PROVIDER_SITE_OTHER): Payer: Medicare Other | Admitting: Gynecology

## 2015-03-21 VITALS — BP 124/78 | Ht 63.0 in | Wt 173.0 lb

## 2015-03-21 DIAGNOSIS — N898 Other specified noninflammatory disorders of vagina: Secondary | ICD-10-CM | POA: Diagnosis not present

## 2015-03-21 DIAGNOSIS — M810 Age-related osteoporosis without current pathological fracture: Secondary | ICD-10-CM | POA: Diagnosis not present

## 2015-03-21 DIAGNOSIS — Z01419 Encounter for gynecological examination (general) (routine) without abnormal findings: Secondary | ICD-10-CM

## 2015-03-21 DIAGNOSIS — N760 Acute vaginitis: Secondary | ICD-10-CM

## 2015-03-21 MED ORDER — CIPROFLOXACIN HCL 500 MG PO TABS: 500.0000 mg | ORAL_TABLET | Freq: Two times a day (BID) | ORAL | Status: DC

## 2015-03-21 MED ORDER — VITAMIN D (ERGOCALCIFEROL) 1.25 MG (50000 UNIT) PO CAPS: ORAL_CAPSULE | ORAL | Status: DC

## 2015-03-21 MED ORDER — CALCITONIN (SALMON) 200 UNIT/ACT NA SOLN: 1.0000 | Freq: Every day | NASAL | Status: DC

## 2015-03-21 MED ORDER — METRONIDAZOLE 0.75 % VA GEL: VAGINAL | Status: DC

## 2015-03-21 MED ORDER — NORETHINDRONE-ETH ESTRADIOL 1-5 MG-MCG PO TABS: 1.0000 | ORAL_TABLET | Freq: Every day | ORAL | Status: DC

## 2015-03-21 MED ORDER — TERCONAZOLE 0.4 % VA CREA: 1.0000 | TOPICAL_CREAM | Freq: Every day | VAGINAL | Status: DC

## 2015-03-21 MED ORDER — VALACYCLOVIR HCL 1 G PO TABS: 1000.0000 mg | ORAL_TABLET | Freq: Every day | ORAL | Status: DC

## 2015-03-22 LAB — VITAMIN D 25 HYDROXY (VIT D DEFICIENCY, FRACTURES): VIT D 25 HYDROXY: 34 ng/mL (ref 30–100)

## 2015-03-22 NOTE — Progress Notes (Signed)
[name] 10-Oct-1945 [id number]   History:    69 y.o.  for annual gyn exam with known history of osteoporosis as well as chronic vulvar irritation and pruritus. Patient also with past history vitamin D deficiency.Patient has been on the generic Jintelli 1 mg as a form of HRT. She is having no vaginal bleeding. She is having no pelvic pain. Because of her history of osteoporosis at one time she had been on Actonel and had to discontinued because of severe joint pains. She is in the process of having dental implants and for this reason she has been off of her Miacalcin (calcitonin/salmon) to 200 units/ACT nasal spray which she applies to one nostril daily because of her planned oral surgery. She has had several dental implants in the past. Her rheumatologist as follows her for her fibromyalgia and was the one who recommended coming off the Miacalcin until her oral surgery has been completed. Past medical history,surgical history, family history and social history were all reviewed and documented in the EPIC chart. Her PCP does her lab work. (Polite) patient with no past history of abnormal Pap smear. Patient's vaccinations are all up-to-date. Patient's last bone density in 2013 demonstrated that her lowest T score was -2.5 at the AP spine. Comparisons described as follows:  Comparison: There has been a statistically significant 6.4% increase in BMD in the spine and a statistically significant 5.7% decrease in BMD in the total left hip as compared to 12/24/2006. There has been no significant change in BMD in the spine and a statistically significant 10.2% decrease in BMD in the total left hip as compared to 12/29/2008.  Patient's colonoscopy was normal 5 years ago. Gynecologic History No LMP recorded. Patient is postmenopausal. Contraception: post menopausal status Last Pap: 2013. Results were: normal Last mammogram: 2013. Results were: normal  Obstetric History OB History  Gravida  Para Term Preterm AB SAB TAB Ectopic Multiple Living  2 2 2       2     # Outcome Date GA Lbr Len/2nd Weight Sex Delivery Anes PTL Lv  2 Term           1 Term                ROS: A ROS was performed and pertinent positives and negatives are included in the history.  GENERAL: No fevers or chills. HEENT: No change in vision, no earache, sore throat or sinus congestion. NECK: No pain or stiffness. CARDIOVASCULAR: No chest pain or pressure. No palpitations. PULMONARY: No shortness of breath, cough or wheeze. GASTROINTESTINAL: No abdominal pain, nausea, vomiting or diarrhea, melena or bright red blood per rectum. GENITOURINARY: No urinary frequency, urgency, hesitancy or dysuria. MUSCULOSKELETAL: No joint or muscle pain, no back pain, no recent trauma. DERMATOLOGIC: No rash, no itching, no lesions. ENDOCRINE: No polyuria, polydipsia, no heat or cold intolerance. No recent change in weight. HEMATOLOGICAL: No anemia or easy bruising or bleeding. NEUROLOGIC: No headache, seizures, numbness, tingling or weakness. PSYCHIATRIC: No depression, no loss of interest in normal activity or change in sleep pattern.     Exam: chaperone present  BP 124/78 mmHg  Ht 5\' 3"  (1.6 m)  Wt 173 lb (78.472 kg)  BMI 30.65 kg/m2  Body mass index is 30.65 kg/(m^2).  General appearance : Well developed well nourished female. No acute distress HEENT: Eyes: no retinal hemorrhage or exudates,  Neck supple, trachea midline, no carotid bruits, no thyroidmegaly Lungs: Clear to auscultation, no rhonchi or wheezes,  or rib retractions  Heart: Regular rate and rhythm, no murmurs or gallops Breast:Examined in sitting and supine position were symmetrical in appearance, no palpable masses or tenderness,  no skin retraction, no nipple inversion, no nipple discharge, no skin discoloration, no axillary or supraclavicular lymphadenopathy Abdomen: no palpable masses or tenderness, no rebound or guarding Extremities: no edema or skin  discoloration or tenderness  Pelvic:  Bartholin, Urethra, Skene Glands: Within normal limits             Vagina: No gross lesions or discharge, atrophic changes  Cervix: No gross lesions or discharge  Uterus  anteverted, normal size, shape and consistency, non-tender and mobile  Adnexa  Without masses or tenderness  Anus and perineum  normal   Rectovaginal  normal sphincter tone without palpated masses or tenderness             Hemoccult PCP provides     Assessment/Plan:  69 y.o. female for annual exam who is no longer using vaginal estrogen cream. Patient with history of osteoporosis on a temporary break from her Miacalcin as a result of her planned oral surgery once completed she will return back to treatment. Meanwhile she'll continue with her calcium and vitamin D and weightbearing exercises. She will schedule her bone density study and her mammogram which are overdue. Her PCP has been doing her blood work. We'll check a vitamin D level today. Pap smear no longer indicated.   Terrance Mass MD, 12:42 PM 03/22/2015

## 2015-07-12 ENCOUNTER — Other Ambulatory Visit: Payer: Self-pay

## 2015-07-12 DIAGNOSIS — Z1231 Encounter for screening mammogram for malignant neoplasm of breast: Secondary | ICD-10-CM

## 2015-07-28 ENCOUNTER — Ambulatory Visit: Payer: Self-pay

## 2015-08-11 ENCOUNTER — Ambulatory Visit
Admission: RE | Admit: 2015-08-11 | Discharge: 2015-08-11 | Disposition: A | Discharge status: Home / Self Care | Payer: Medicare Other | Source: Ambulatory Visit

## 2015-08-11 DIAGNOSIS — Z1231 Encounter for screening mammogram for malignant neoplasm of breast: Secondary | ICD-10-CM

## 2015-08-18 ENCOUNTER — Other Ambulatory Visit: Payer: Self-pay | Admitting: Internal Medicine

## 2015-08-18 DIAGNOSIS — R928 Other abnormal and inconclusive findings on diagnostic imaging of breast: Secondary | ICD-10-CM

## 2015-08-23 ENCOUNTER — Ambulatory Visit
Admission: RE | Admit: 2015-08-23 | Discharge: 2015-08-23 | Disposition: A | Discharge status: Home / Self Care | Payer: Medicare Other | Source: Ambulatory Visit | Attending: Internal Medicine | Admitting: Internal Medicine

## 2015-08-23 ENCOUNTER — Other Ambulatory Visit: Payer: Self-pay | Admitting: Internal Medicine

## 2015-08-23 DIAGNOSIS — N6310 Unspecified lump in the right breast, unspecified quadrant: Secondary | ICD-10-CM

## 2015-08-23 DIAGNOSIS — R928 Other abnormal and inconclusive findings on diagnostic imaging of breast: Secondary | ICD-10-CM

## 2015-08-23 DIAGNOSIS — R599 Enlarged lymph nodes, unspecified: Secondary | ICD-10-CM

## 2015-08-24 ENCOUNTER — Ambulatory Visit
Admission: RE | Admit: 2015-08-24 | Discharge: 2015-08-24 | Disposition: A | Discharge status: Home / Self Care | Payer: Medicare Other | Source: Ambulatory Visit | Attending: Internal Medicine | Admitting: Internal Medicine

## 2015-08-24 ENCOUNTER — Other Ambulatory Visit: Payer: Self-pay | Admitting: Internal Medicine

## 2015-08-24 DIAGNOSIS — R928 Other abnormal and inconclusive findings on diagnostic imaging of breast: Secondary | ICD-10-CM

## 2015-08-24 DIAGNOSIS — N6310 Unspecified lump in the right breast, unspecified quadrant: Secondary | ICD-10-CM

## 2015-08-24 DIAGNOSIS — Z712 Person consulting for explanation of examination or test findings: Secondary | ICD-10-CM

## 2015-08-25 ENCOUNTER — Ambulatory Visit
Admission: RE | Admit: 2015-08-25 | Discharge: 2015-08-25 | Disposition: A | Discharge status: Home / Self Care | Payer: Medicare Other | Source: Ambulatory Visit | Attending: Internal Medicine | Admitting: Internal Medicine

## 2015-08-25 ENCOUNTER — Telehealth: Payer: Self-pay | Admitting: Oncology

## 2015-08-25 DIAGNOSIS — Z712 Person consulting for explanation of examination or test findings: Secondary | ICD-10-CM

## 2015-08-25 NOTE — Telephone Encounter (Signed)
Patient already on scheduled for new patient breast with [facility] 5/3 - all information gathered by navigator. Per navigator completed referral entry and confirmed with patient demographic and insurance information.

## 2015-08-26 ENCOUNTER — Other Ambulatory Visit: Payer: Self-pay | Admitting: General Surgery

## 2015-08-26 ENCOUNTER — Telehealth: Payer: Self-pay | Admitting: *Deleted

## 2015-08-26 ENCOUNTER — Ambulatory Visit
Admission: RE | Admit: 2015-08-26 | Discharge: 2015-08-26 | Disposition: A | Discharge status: Home / Self Care | Payer: Medicare Other | Source: Ambulatory Visit | Attending: General Surgery | Admitting: General Surgery

## 2015-08-26 DIAGNOSIS — C50911 Malignant neoplasm of unspecified site of right female breast: Secondary | ICD-10-CM

## 2015-08-26 MED ORDER — GADOBENATE DIMEGLUMINE 529 MG/ML IV SOLN
15.0000 mL | Freq: Once | INTRAVENOUS | Status: AC | PRN
  Administered 2015-08-26: 15 mL via INTRAVENOUS

## 2015-08-26 NOTE — Telephone Encounter (Signed)
Called pt and confirmed 08/31/15 appt.  Gave directions, instructions and contact info.  Placed a note for an intake form to be given to the patient at time of check in.

## 2015-08-30 ENCOUNTER — Other Ambulatory Visit: Payer: Self-pay | Admitting: *Deleted

## 2015-08-30 DIAGNOSIS — C50919 Malignant neoplasm of unspecified site of unspecified female breast: Secondary | ICD-10-CM

## 2015-08-31 ENCOUNTER — Ambulatory Visit (HOSPITAL_BASED_OUTPATIENT_CLINIC_OR_DEPARTMENT_OTHER): Payer: Medicare Other | Admitting: Oncology

## 2015-08-31 ENCOUNTER — Other Ambulatory Visit (HOSPITAL_BASED_OUTPATIENT_CLINIC_OR_DEPARTMENT_OTHER): Payer: Medicare Other

## 2015-08-31 ENCOUNTER — Other Ambulatory Visit: Payer: Self-pay | Admitting: General Surgery

## 2015-08-31 VITALS — BP 156/81 | HR 95 | Temp 97.7°F | Resp 20 | Ht 63.0 in | Wt 172.1 lb

## 2015-08-31 DIAGNOSIS — Z803 Family history of malignant neoplasm of breast: Secondary | ICD-10-CM | POA: Diagnosis not present

## 2015-08-31 DIAGNOSIS — N6022 Fibroadenosis of left breast: Secondary | ICD-10-CM | POA: Diagnosis not present

## 2015-08-31 DIAGNOSIS — C50411 Malignant neoplasm of upper-outer quadrant of right female breast: Secondary | ICD-10-CM | POA: Diagnosis present

## 2015-08-31 DIAGNOSIS — N3010 Interstitial cystitis (chronic) without hematuria: Secondary | ICD-10-CM

## 2015-08-31 DIAGNOSIS — C50919 Malignant neoplasm of unspecified site of unspecified female breast: Secondary | ICD-10-CM | POA: Diagnosis not present

## 2015-08-31 DIAGNOSIS — C773 Secondary and unspecified malignant neoplasm of axilla and upper limb lymph nodes: Secondary | ICD-10-CM

## 2015-08-31 DIAGNOSIS — Z170 Estrogen receptor positive status [ER+]: Secondary | ICD-10-CM | POA: Diagnosis not present

## 2015-08-31 DIAGNOSIS — M810 Age-related osteoporosis without current pathological fracture: Secondary | ICD-10-CM

## 2015-08-31 DIAGNOSIS — Z808 Family history of malignant neoplasm of other organs or systems: Secondary | ICD-10-CM | POA: Diagnosis not present

## 2015-08-31 DIAGNOSIS — M797 Fibromyalgia: Secondary | ICD-10-CM

## 2015-08-31 DIAGNOSIS — N63 Unspecified lump in unspecified breast: Secondary | ICD-10-CM

## 2015-08-31 LAB — CBC WITH DIFFERENTIAL/PLATELET
BASO%: 1.2 % (ref 0.0–2.0)
Basophils Absolute: 0.1 10*3/uL (ref 0.0–0.1)
EOS%: 1.8 % (ref 0.0–7.0)
Eosinophils Absolute: 0.2 10*3/uL (ref 0.0–0.5)
HEMATOCRIT: 46.1 % (ref 34.8–46.6)
HEMOGLOBIN: 15.3 g/dL (ref 11.6–15.9)
LYMPH#: 2.3 10*3/uL (ref 0.9–3.3)
LYMPH%: 19.9 % (ref 14.0–49.7)
MCH: 30.7 pg (ref 25.1–34.0)
MCHC: 33.1 g/dL (ref 31.5–36.0)
MCV: 92.5 fL (ref 79.5–101.0)
MONO#: 0.6 10*3/uL (ref 0.1–0.9)
MONO%: 5.3 % (ref 0.0–14.0)
NEUT%: 71.8 % (ref 38.4–76.8)
NEUTROS ABS: 8.4 10*3/uL — AB (ref 1.5–6.5)
PLATELETS: 344 10*3/uL (ref 145–400)
RBC: 4.98 10*6/uL (ref 3.70–5.45)
RDW: 13.5 % (ref 11.2–14.5)
WBC: 11.7 10*3/uL — AB (ref 3.9–10.3)

## 2015-08-31 LAB — COMPREHENSIVE METABOLIC PANEL
ALT: 21 U/L (ref 0–55)
AST: 20 U/L (ref 5–34)
Albumin: 3.9 g/dL (ref 3.5–5.0)
Alkaline Phosphatase: 83 U/L (ref 40–150)
Anion Gap: 9 mEq/L (ref 3–11)
BILIRUBIN TOTAL: 0.46 mg/dL (ref 0.20–1.20)
BUN: 12.3 mg/dL (ref 7.0–26.0)
CALCIUM: 9.6 mg/dL (ref 8.4–10.4)
CHLORIDE: 102 meq/L (ref 98–109)
CO2: 26 mEq/L (ref 22–29)
CREATININE: 1 mg/dL (ref 0.6–1.1)
EGFR: 58 mL/min/{1.73_m2} — ABNORMAL LOW (ref >90)
Glucose: 102 mg/dl (ref 70–140)
Potassium: 4.4 mEq/L (ref 3.5–5.1)
Sodium: 138 mEq/L (ref 136–145)
TOTAL PROTEIN: 7.8 g/dL (ref 6.4–8.3)

## 2015-08-31 NOTE — Progress Notes (Signed)
[name]  [phone] [phone]     ID: [name] DOB: 1946-03-17  MR#: [id number]  XIP#:[id number]  Patient Care Team: Seward Carol, MD as PCP - General (Internal Medicine) Chauncey Cruel, MD as Consulting Physician (Oncology) Fanny Skates, MD as Consulting Physician (General Surgery) Luberta Robertson, MD (Radiology) Hennie Duos, MD as Consulting Physician (Rheumatology) Domingo Pulse, MD (Urology) Druscilla Brownie, MD as Consulting Physician (Dermatology) PCP: Kandice Hams, MD GYN: OTHER MD:  CHIEF COMPLAINT: Locally advanced breast cancer  CURRENT TREATMENT: Neoadjuvant chemotherapy   BREAST CANCER HISTORY: [name] had routine mammographic screening in 08/18/2015 at the [facility], showing a right breast mass and possible axillary adenopathy. On 08/23/2015 she underwent bilateral diagnostic mammography with tomography and bilateral breast ultrasonography. The breast density was category B in the right breast there was a spiculated mass in the upper-outer quadrant measuring 1.2 cm and associated with pleomorphic calcifications. There were calcifications extending anteriorly and posteriorly from the mass worrisome for ductal carcinoma in situ. The total area in question is about 3 cm. There were also multiple enlarged right axillary lymph nodes. The mass was palpated as an area of fullness at the 12:00 position of the breast 3 cm from the nipple. There were palpable enlarged right axillary lymph nodes in the right axilla. Ultrasonography of the right breast confirmed an irregular hypoechoic mass in the area in question measuring 1.6 cm. There were multiple abnormal appearing enlarged right axillary lymph nodes, the largest measuring 2.3 cm.  In the left breast, mammography showed an area of architectural distortion associated with the left nipple. This was not palpable. Ultrasonography of the left breast found no correlate. This area  of the left breast was biopsied under tomography 08/24/2015. This showed (SAA [id number]) a complex sclerosing lesion and fibroadenoma.  On 08/23/2015 the patient underwent biopsy of the right breast mass in 1 of the abnormal right axillary lymph nodes. 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 Both biopsies were HER-2 nonamplified, the breast being ratio 1.27 and number per cell 1.90, and the lymph node 1 ratio 1.24 and number per cell 3.0.  On 08/26/2015 the patient underwent bilateral breast MRI. In the left breast there was no suspicious enhancement. The left axilla was benign. In the right breast there was a mass described as in the upper inner quadrant measuring 2.3 cm. Less than a centimeter away from this mass there was a satellite nodule measuring 0.9 cm. In the right axilla there were multiple enlarged right axillary lymph nodes, including a confluent region measuring up to 4 cm. There were also enlarged level II lymph nodes present measuring up to 2.1 cm.  The patient's subsequent history is as detailed below  INTERVAL HISTORY: [name] was evaluated in the breast clinic 08/31/2015 accompanied by her husband [name] and her daughter [name]. The patient's daughter [name] participated by speakerphone. Her case was also presented in the multidisciplinary breast cancer conference that same morning. At that time a preliminary plan was proposed: Consideration of staging studies, consideration of genetics referral, neoadjuvant chemotherapy, followed by definitive surgery, adjuvant radiation, and adjuvant anti-estrogens.  REVIEW OF SYSTEMS: There were no specific symptoms leading to the original mammogram, which was routinely scheduled. The patient denies unusual headaches, visual changes, nausea, vomiting, stiff neck, dizziness, or gait  imbalance. There has been no  cough, phlegm production, or pleurisy, no chest pain or pressure, and no change in bowel or bladder habits. The patient denies fever, rash, bleeding, unexplained fatigue or unexplained weight loss. The patient does have a history of fibromyalgia and "hurts all over" chronically. She complains of insomnia, ringing in her ears, dental problems, rare episodes of asthma, long-standing interstitial cystitis, sciatica, and situational anxiety. A detailed review of systems was otherwise entirely negative.  PAST MEDICAL HISTORY: Past Medical History  Diagnosis Date  . Osteoporosis   . Menopausal symptoms   . Chronic vaginitis   . Fibromyalgia   . Interstitial cystitis   . Thyroid disease     hypothyroid  . Hypoglycemia   . Asthma     PAST SURGICAL HISTORY: Past Surgical History  Procedure Laterality Date  . Nasal sinus surgery      X4  . Bladder hyperextension      X4  . Bunionectomy    . Leg surgery  06/2004    Broken tibia  . Cholecystectomy  01/2006  . Cesarean section      DR. INGRAM  . Mouth surgery      FAMILY HISTORY Family History  Problem Relation Age of Onset  . Hypertension Mother   . Heart disease Mother   . Parkinsonism Father   . Heart disease Sister   . Cancer Sister     MELANOMA  . Breast cancer Sister     Age 52  The patient's father died at age 51 from complications of Parkinson's disease. The patient's mother died age 84 with heart disease. [name] had no brothers. She had 2 sisters. One sister was diagnosed with breast cancer at age 27. She was tested for the BRCA gene and was negative. The same sister also had melanoma diagnosed in her 29s. The patient has one maternal great aunt diagnosed with breast cancer around age 42. There is no history of ovarian cancer in the family  GYNECOLOGIC HISTORY:  No LMP recorded. Patient is postmenopausal. Menarche age 73, first live birth age 19. The patient is GX P2. She stopped having periods  in her early 75s and took hormone replacement until her breast cancer diagnosis, April 2017.  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 He has been in remission for more than 10 years. Their daughter [name] lives in [place] were she is Production designer, theatre/television/film of the [employer]. Daughter [name] is an infectious disease M.D. working and teaching at [employer] in [place]. The patient has one granddaughter, 35 months old as of May 2017. The family attends [employer]: Not in place   HEALTH MAINTENANCE: Social History  Substance Use Topics  . Smoking status: Former Research scientist (life sciences)  . Smokeless tobacco: Not on file  . Alcohol Use: 0.0 oz/week    0 Standard drinks or equivalent per week     Comment: rare     Colonoscopy: 2016/ [facility]  PAP: 2015  Bone density:   Lipid panel:  Allergies  Allergen Reactions  . Fluconazole     REACTION: elavated LFTs  . Nitrofurantoin   . Penicillins     Current Outpatient Prescriptions  Medication Sig Dispense Refill  . buPROPion (WELLBUTRIN XL) 300 MG 24 hr tablet Take 300 mg by mouth daily.      Marland Kitchen  calcitonin, salmon, (MIACALCIN/FORTICAL) 200 UNIT/ACT nasal spray Place 1 spray into alternate nostrils daily. 3.7 mL 11  . Calcium Carbonate-Vitamin D (CALCIUM + D PO) Take by mouth.      . Carisoprodol (SOMA PO) Take by mouth.      . clotrimazole-betamethasone (LOTRISONE) cream Apply topically 2 (two) times daily. (Patient taking differently: Apply 1 application topically as needed. ) 45 g 3  . diazepam (VALIUM) 5 MG tablet Take 5 mg by mouth every 6 (six) hours as needed.      . Hydrocodone-Acetaminophen (VICODIN PO) Take 1 tablet by mouth as needed.     . hydrocortisone (ANUSOL-HC) 25 MG suppository Place 1 suppository (25 mg total)  rectally 2 (two) times daily as needed for hemorrhoids or itching. 20 suppository 5  . Levothyroxine Sodium (SYNTHROID PO) Take 137 mcg by mouth daily.     . Meth-Hyo-M Bl-Na Phos-Ph Sal (URIMAX PO) Take by mouth.      . metroNIDAZOLE (METROGEL VAGINAL) 0.75 % vaginal gel One vaginal application daily (Patient taking differently: Place 1 Applicatorful vaginally as needed. One vaginal application daily) 70 g 3  . Multiple Vitamin (MULTIVITAMIN) capsule Take 1 capsule by mouth daily.      . Nabumetone (RELAFEN PO) Take 1 tablet by mouth as needed.     . Pantoprazole Sodium (PROTONIX PO) Take 1 tablet by mouth daily.     Marland Kitchen sulfamethoxazole-trimethoprim (BACTRIM DS,SEPTRA DS) 800-160 MG per tablet Take 1 tablet by mouth 2 (two) times daily. (Patient taking differently: Take 1 tablet by mouth as needed. ) 14 tablet 1  . terconazole (TERAZOL 7) 0.4 % vaginal cream Place 1 applicator vaginally at bedtime. 45 g 5  . tobramycin (TOBREX) 0.3 % ophthalmic solution Place 1-2 drops into the right eye 4 (four) times daily. X 5 days 5 mL 0  . Triamterene-HCTZ (DYAZIDE PO) Take 1 tablet by mouth daily.     . valACYclovir (VALTREX) 1000 MG tablet Take 1 tablet (1,000 mg total) by mouth daily. (Patient taking differently: Take 1,000 mg by mouth as needed. ) 30 tablet 1  . Vitamin D, Ergocalciferol, (DRISDOL) 50000 UNITS CAPS capsule One tablet every other week 10 capsule 3  . zolpidem (AMBIEN) 10 MG tablet Take 10 mg by mouth at bedtime as needed.      . Almotriptan Malate (AXERT PO) Take 1 tablet by mouth as needed. Reported on 08/31/2015    . HYDROXYZINE HCL PO Take 1 tablet by mouth as needed. Reported on 08/31/2015    . miconazole (MONISTAT 1 COMBINATION PACK) kit Place 1 each vaginally once. (Patient not taking: Reported on 08/31/2015) 1 each 2   No current facility-administered medications for this visit.    OBJECTIVE: Middle-aged white woman who appears stated age 40 Vitals:   08/31/15 1628  BP: 156/81    Pulse: 95  Temp: 97.7 F (36.5 C)  Resp: 20     Body mass index is 30.49 kg/(m^2).    ECOG FS:1 - Symptomatic but completely ambulatory  Ocular: Sclerae unicteric, pupils equal, round and reactive to light Ear-nose-throat: Oropharynx clear and moist Lymphatic: No cervical or supraclavicular adenopathy Lungs no rales or rhonchi, good excursion bilaterally Heart regular rate and rhythm, no murmur appreciated Abd soft, nontender, positive bowel sounds MSK no focal spinal tenderness, no joint edema Neuro: non-focal, well-oriented, appropriate affect Breasts: Both breasts are status post recent biopsy. There are ecchymoses bilaterally. The breasts are relatively lumpy. There is palpable adenopathy in the right axilla. The  left axilla is benign.   LAB RESULTS:  CMP     Component Value Date/Time   NA 138 08/31/2015 1612   K 4.4 08/31/2015 1612   CO2 26 08/31/2015 1612   GLUCOSE 102 08/31/2015 1612   BUN 12.3 08/31/2015 1612   CREATININE 1.0 08/31/2015 1612   CALCIUM 9.6 08/31/2015 1612   PROT 7.8 08/31/2015 1612   ALBUMIN 3.9 08/31/2015 1612   AST 20 08/31/2015 1612   ALT 21 08/31/2015 1612   ALKPHOS 83 08/31/2015 1612   BILITOT 0.46 08/31/2015 1612    INo results found for: SPEP, UPEP  Lab Results  Component Value Date   WBC 11.7* 08/31/2015   NEUTROABS 8.4* 08/31/2015   HGB 15.3 08/31/2015   HCT 46.1 08/31/2015   MCV 92.5 08/31/2015   PLT 344 08/31/2015      Chemistry      Component Value Date/Time   NA 138 08/31/2015 1612   K 4.4 08/31/2015 1612   CO2 26 08/31/2015 1612   BUN 12.3 08/31/2015 1612   CREATININE 1.0 08/31/2015 1612      Component Value Date/Time   CALCIUM 9.6 08/31/2015 1612   ALKPHOS 83 08/31/2015 1612   AST 20 08/31/2015 1612   ALT 21 08/31/2015 1612   BILITOT 0.46 08/31/2015 1612       No results found for: LABCA2  No components found for: LABCA125  No results for input(s): INR in the last 168 hours.  Urinalysis No results  found for: COLORURINE, APPEARANCEUR, LABSPEC, PHURINE, GLUCOSEU, HGBUR, BILIRUBINUR, KETONESUR, PROTEINUR, UROBILINOGEN, NITRITE, LEUKOCYTESUR    ELIGIBLE FOR AVAILABLE RESEARCH PROTOCOL: no  STUDIES: Mr Breast Bilateral W Wo Contrast  08/29/2015  ADDENDUM REPORT: 08/29/2015 15:18 ADDENDUM: Corrected report: Lymph nodes: Multiple enlarged right axillary lymph nodes are identified. The largest mass measured is likely a confluence of multiple nodes and measures approximately 4.0 cm. Per discussion with Dr. Dalbert Batman, patient will be scheduled for MR guided biopsy and clip placement of the satellite nodule within the right breast. Electronically Signed   By: Nolon Nations M.D.   On: 08/29/2015 15:18  08/29/2015  CLINICAL DATA:  Recent diagnosis of invasive and in situ ductal carcinoma following a biopsy of the right breast 12 o'clock location. Biopsy of enlarged right axillary lymph node demonstrated metastatic carcinoma. Stereotactic guided core biopsy of distortion in the 1 o'clock location shows a complex sclerosing lesion without atypia. LABS:  Creatinine was obtained on site at [facility] at [address]Results: Creatinine 1.0 mg/dL. EXAM: BILATERAL BREAST MRI WITH AND WITHOUT CONTRAST TECHNIQUE: Multiplanar, multisequence MR images of both breasts were obtained prior to and following the intravenous administration of 15 ml of MultiHance. THREE-DIMENSIONAL MR IMAGE RENDERING ON INDEPENDENT WORKSTATION: Three-dimensional MR images were rendered by post-processing of the original MR data on an independent workstation. The three-dimensional MR images were interpreted, and findings are reported in the following complete MRI report for this study. Three dimensional images were evaluated at the independent DynaCad workstation COMPARISON:  Recent mammogram and ultrasound exams from the [facility] dated 08/24/2015 and earlier FINDINGS: Breast composition: b. Scattered  fibroglandular tissue. Background parenchymal enhancement: Minimal Right breast: There is an irregular mass within the upper inner quadrant of the right breast measuring 2.3 x 2.3 x 1.2 cm appear within the mass there is tissue marker clip from recent ultrasound-guided core biopsy which demonstrated invasive ductal carcinoma and ductal carcinoma in situ. 5 mm medial and inferior to this mass, there  is a small irregular enhancing mass which measures 7 x 9 mm. Given the irregularity and appearance, this is favored to represent a small satellite nodule. Enhancement within both areas is rapid wash-in and washout type kinetics. No other suspicious abnormalities are identified within the right breast. Left breast: There is tissue marker clip artifact within the upper-outer quadrant of the left breast following a recent stereotactic guided core biopsy which demonstrated complex sclerosing lesion. No suspicious enhancement is identified in the left breast. Excision has been recommended of this lesion. Lymph nodes: Multiple enlarged right axillary lymph nodes are identified. The largest is likely ache and fluid region of multiple nodes measuring approximately 4.0 cm. Enlarged level 2 lymph nodes are present, measuring up to 2.1 cm. Left axilla and internal mammary chain are negative for adenopathy. Ancillary findings:  None. IMPRESSION: 1. Known malignancy measuring 2.3 x 2.3 cm in the upper inner quadrant of the right breast. 2. Small adjacent satellite nodule along the lower medial aspect of the known malignancy measuring 7 x 9 mm. 3. Multiple large right axillary lymph nodes including level 2 nodes. Findings are consistent with known metastatic disease. 4. Expected post biopsy changes in the left breast without significant enhancement following biopsy demonstrating complex sclerosing lesion. RECOMMENDATION: Treatment plan for known right breast cancer. BI-RADS CATEGORY  6: Known biopsy-proven malignancy. Electronically  Signed: By: Nolon Nations M.D. On: 08/29/2015 11:44   Mm Digital Diagnostic Unilat R  08/23/2015  CLINICAL DATA:  Post right breast ultrasound-guided biopsy. EXAM: DIAGNOSTIC RIGHT MAMMOGRAM POST ULTRASOUND BIOPSY COMPARISON:  Previous exam(s). FINDINGS: Mammographic images were obtained following ultrasound guided biopsy of the spiculated mass located within the right breast at 12 o'clock position an enlarged right axillary lymph node. The ribbon shaped clip is in appropriate position associated with spiculated mass located within the right breast at 12 o'clock position. Spiral shaped HydroMARK clip is in appropriate position associated with an enlarged right axillary lymph node as seen in the right MLO projection. IMPRESSION: Appropriate positioning of clips following right breast and right axillary lymph node ultrasound-guided core biopsies. Final Assessment: Post Procedure Mammograms for Marker Placement Electronically Signed   By: Altamese Cabal M.D.   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 Patient returns for results following left breast tomosynthesis guided biopsy. Current Pain Level: 3 HISTORY OF PRESENT ILLNESS: Diagnostic evaluation demonstrated a spiculated mass with calcifications located superiorly within the right breast with abnormal appearing right axillary lymph nodes. Right breast and axillary lymph node ultrasound-guided core biopsies were performed on 08/23/2015. An additional area of subtle distortion was noted within the left breast and tomosynthesis guided biopsy of the left breast was performed on 08/24/2015. The patient returns for results. EXAM: There is moderate ecchymosis present bilaterally related to the patient's bilateral ultrasound-guided biopsies. There is no hematoma formation or  signs of infection. PATHOLOGY: The pathology associated with the left breast tomosynthesis guided biopsy of the subtle area of distortion located within the anterior left breast at the 1 o'clock position demonstrated a complex sclerosing lesion. Pathology is concordant with the imaging findings. ASSESSMENT AND PLAN: ASSESSMENT AND PLAN 1. Complex sclerosing lesion associated with the subtle area of distortion located within the left breast 1 o'clock position. Excision is recommended. Also, the patient has known right breast invasive ductal carcinoma with DCIS and  a metastatic right axillary lymph node. The patient is scheduled to see Dr.Magrinat on 08/31/2015. The patient has been rescheduled to see Dr. Derrell Lolling for surgical consultation on 08/26/2015. Post biopsy wound care instructions were reviewed with the patient. The patient was encouraged to call our breast center for additional questions or concerns. Electronically Signed   By: Rolla Plate M.D.   On: 08/25/2015 14:43   Mm Radiologist Eval And Mgmt  08/24/2015  EXAM: ESTABLISHED PATIENT OFFICE VISIT - LEVEL II CHIEF COMPLAINT: Patient returns for results following right breast and axillary lymph node ultrasound-guided core biopsies. Current Pain Level: 3 HISTORY OF PRESENT ILLNESS: The patient noted a of palpable mass within the right breast. Evaluation demonstrated a mass located within the right breast superiorly and right axillary adenopathy. Also, there is possible distortion located within the left breast 1 o'clock position. Patient underwent right breast and right axillary lymph node ultrasound-guided core biopsies on 08/23/2015 and returns for results. EXAM: There is mild ecchymosis associated with the right breast and right axillary lymph node ultrasound-guided core biopsy sites. There is no hematoma formation and no signs of infection. PATHOLOGY: The pathology demonstrated invasive ductal carcinoma with DCIS and a metastatic right axillary  lymph node. The pathology is concordant with the imaging findings. I have discussed the findings with the patient and her husband and answered their questions. 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 Plan is to perform left breast tomosynthesis guided biopsy 08/24/2015 of an area of possible subtle distortion located within the left breast at the 1 o'clock position. Patient is scheduled for Medical Oncology consultation with Dr. Darnelle Catalan on 08/31/2015. The patient is scheduled see Dr. Claud Kelp for surgical consultation on 09/08/2015. Post biopsy wound care instructions were reviewed with the patient. Electronically Signed   By: Rolla Plate M.D.   On: 08/24/2015 14:14   US Breast Ltd Uni Left Inc Axilla  08/23/2015  CLINICAL DATA:  Recall from screening mammography. EXAM: 2D DIGITAL DIAGNOSTIC BILATERAL MAMMOGRAM WITH CAD AND ADJUNCT TOMO ULTRASOUND BILATERAL BREAST COMPARISON:  08/11/2015, 01/15/2012, 01/09/2011. ACR Breast Density Category b: There are scattered areas of fibroglandular density. FINDINGS: Additional views of the right breast demonstrate a spiculated mass to be present located within the right breast at the 11:30 to 12 o'clock position with associated pleomorphic calcifications within the mass. The mass measures 1.2 cm by mammography. In addition, there are pleomorphic calcifications extending anteriorly and posteriorly from the mass which are worrisome for possible DCIS. The mass and calcifications span approximately 3 cm. In addition, there are multiple enlarged right axillary lymph nodes present. Additional tomosynthesis views of the left breast demonstrate subtle possible architectural distortion located slightly lateral and superior to the left nipple within the anterior 1/3 of the breast. There is a small adjacent oval circumscribed nodule present which has been stable when compared with  mammograms dating back to 2007. Mammographic images were processed with CAD. On physical exam, there is a fullness present within the right breast at 12 o'clock position 3 cm from the nipple. There are palpable enlarged right axillary lymph nodes present within the low right axilla. There is no palpable abnormality within the left breast. Targeted ultrasound is performed, showing an irregularly marginated hypoechoic mass with shadowing located within the right breast at the 12 o'clock position 3 cm from the nipple measuring 1.6 x 1.2 x 1.2 cm in size. This is worrisome for invasive mammary carcinoma.  In addition, there are multiple abnormal appearing enlarged right axillary lymph nodes present with loss of normal hilar fat and increased vascularity. The largest lymph node measures 2.3 x 2.3 x 1.3 cm in size. Ultrasound-guided core biopsy of the right breast mass and 1 of the enlarged right axillary lymph node is recommended. This will be performed today and reported separately. Ultrasound of the left breast in the area of questioned distortion noted on the recent mammogram demonstrates no definite ultrasound correlate. As a result, tomosynthesis guided biopsy of the possible subtle distortion within the left breast is recommended and will be scheduled. IMPRESSION: 1.Spiculated mass with associated worrisome calcifications located within the right breast the 12 o'clock position worrisome for invasive mammary carcinoma and adjacent DCIS. This is associated with multiple enlarged right axillary lymph nodes. 2. Question subtle persistent distortion located slightly lateral and superior to the left nipple within the anterior 1/3 of the left breast. Tomosynthesis guided biopsy is recommended and will be scheduled. RECOMMENDATION: 1. Right breast ultrasound-guided core biopsy at the 12 o'clock position. 2. Right axillary lymph node ultrasound-guided core biopsy. 3. Left breast tomosynthesis guided biopsy of questionable  subtle distortion. I have discussed the findings and recommendations with the patient. Results were also provided in writing at the conclusion of the visit. If applicable, a reminder letter will be sent to the patient regarding the next appointment. BI-RADS CATEGORY  5: Highly suggestive of malignancy. Electronically Signed   By: Altamese Cabal M.D.   On: 08/23/2015 17:08   US Breast Ltd Uni Right Inc Axilla  08/23/2015  CLINICAL DATA:  Recall from screening mammography. EXAM: 2D DIGITAL DIAGNOSTIC BILATERAL MAMMOGRAM WITH CAD AND ADJUNCT TOMO ULTRASOUND BILATERAL BREAST COMPARISON:  08/11/2015, 01/15/2012, 01/09/2011. ACR Breast Density Category b: There are scattered areas of fibroglandular density. FINDINGS: Additional views of the right breast demonstrate a spiculated mass to be present located within the right breast at the 11:30 to 12 o'clock position with associated pleomorphic calcifications within the mass. The mass measures 1.2 cm by mammography. In addition, there are pleomorphic calcifications extending anteriorly and posteriorly from the mass which are worrisome for possible DCIS. The mass and calcifications span approximately 3 cm. In addition, there are multiple enlarged right axillary lymph nodes present. Additional tomosynthesis views of the left breast demonstrate subtle possible architectural distortion located slightly lateral and superior to the left nipple within the anterior 1/3 of the breast. There is a small adjacent oval circumscribed nodule present which has been stable when compared with mammograms dating back to 2007. Mammographic images were processed with CAD. On physical exam, there is a fullness present within the right breast at 12 o'clock position 3 cm from the nipple. There are palpable enlarged right axillary lymph nodes present within the low right axilla. There is no palpable abnormality within the left breast. Targeted ultrasound is performed, showing an irregularly  marginated hypoechoic mass with shadowing located within the right breast at the 12 o'clock position 3 cm from the nipple measuring 1.6 x 1.2 x 1.2 cm in size. This is worrisome for invasive mammary carcinoma. In addition, there are multiple abnormal appearing enlarged right axillary lymph nodes present with loss of normal hilar fat and increased vascularity. The largest lymph node measures 2.3 x 2.3 x 1.3 cm in size. Ultrasound-guided core biopsy of the right breast mass and 1 of the enlarged right axillary lymph node is recommended. This will be performed today and reported separately. Ultrasound of the left breast in the area  of questioned distortion noted on the recent mammogram demonstrates no definite ultrasound correlate. As a result, tomosynthesis guided biopsy of the possible subtle distortion within the left breast is recommended and will be scheduled. IMPRESSION: 1.Spiculated mass with associated worrisome calcifications located within the right breast the 12 o'clock position worrisome for invasive mammary carcinoma and adjacent DCIS. This is associated with multiple enlarged right axillary lymph nodes. 2. Question subtle persistent distortion located slightly lateral and superior to the left nipple within the anterior 1/3 of the left breast. Tomosynthesis guided biopsy is recommended and will be scheduled. RECOMMENDATION: 1. Right breast ultrasound-guided core biopsy at the 12 o'clock position. 2. Right axillary lymph node ultrasound-guided core biopsy. 3. Left breast tomosynthesis guided biopsy of questionable subtle distortion. I have discussed the findings and recommendations with the patient. Results were also provided in writing at the conclusion of the visit. If applicable, a reminder letter will be sent to the patient regarding the next appointment. BI-RADS CATEGORY  5: Highly suggestive of malignancy. Electronically Signed   By: Altamese Cabal M.D.   On: 08/23/2015 17:08   Mm Diag Breast  Tomo Uni Left  08/24/2015  CLINICAL DATA:  Post left breast tomosynthesis guided biopsy. EXAM: DIAGNOSTIC LEFT MAMMOGRAM POST TOMOSYNTHESIS GUIDED BIOPSY COMPARISON:  Previous exam(s). FINDINGS: Mammographic images were obtained following tomosynthesis guided biopsy of a subtle area of possible distortion located within the left breast at the 1 o'clock position. The X shaped clip appears in appropriate position. IMPRESSION: Appropriate position of clip following left breast tomosynthesis guided biopsy. Final Assessment: Post Procedure Mammograms for Marker Placement Electronically Signed   By: Altamese Cabal M.D.   On: 08/24/2015 14:09   Mm Diag Breast Tomo Bilateral  08/23/2015  CLINICAL DATA:  Recall from screening mammography. EXAM: 2D DIGITAL DIAGNOSTIC BILATERAL MAMMOGRAM WITH CAD AND ADJUNCT TOMO ULTRASOUND BILATERAL BREAST COMPARISON:  08/11/2015, 01/15/2012, 01/09/2011. ACR Breast Density Category b: There are scattered areas of fibroglandular density. FINDINGS: Additional views of the right breast demonstrate a spiculated mass to be present located within the right breast at the 11:30 to 12 o'clock position with associated pleomorphic calcifications within the mass. The mass measures 1.2 cm by mammography. In addition, there are pleomorphic calcifications extending anteriorly and posteriorly from the mass which are worrisome for possible DCIS. The mass and calcifications span approximately 3 cm. In addition, there are multiple enlarged right axillary lymph nodes present. Additional tomosynthesis views of the left breast demonstrate subtle possible architectural distortion located slightly lateral and superior to the left nipple within the anterior 1/3 of the breast. There is a small adjacent oval circumscribed nodule present which has been stable when compared with mammograms dating back to 2007. Mammographic images were processed with CAD. On physical exam, there is a fullness present within the  right breast at 12 o'clock position 3 cm from the nipple. There are palpable enlarged right axillary lymph nodes present within the low right axilla. There is no palpable abnormality within the left breast. Targeted ultrasound is performed, showing an irregularly marginated hypoechoic mass with shadowing located within the right breast at the 12 o'clock position 3 cm from the nipple measuring 1.6 x 1.2 x 1.2 cm in size. This is worrisome for invasive mammary carcinoma. In addition, there are multiple abnormal appearing enlarged right axillary lymph nodes present with loss of normal hilar fat and increased vascularity. The largest lymph node measures 2.3 x 2.3 x 1.3 cm in size. Ultrasound-guided core biopsy of the right breast mass  and 1 of the enlarged right axillary lymph node is recommended. This will be performed today and reported separately. Ultrasound of the left breast in the area of questioned distortion noted on the recent mammogram demonstrates no definite ultrasound correlate. As a result, tomosynthesis guided biopsy of the possible subtle distortion within the left breast is recommended and will be scheduled. IMPRESSION: 1.Spiculated mass with associated worrisome calcifications located within the right breast the 12 o'clock position worrisome for invasive mammary carcinoma and adjacent DCIS. This is associated with multiple enlarged right axillary lymph nodes. 2. Question subtle persistent distortion located slightly lateral and superior to the left nipple within the anterior 1/3 of the left breast. Tomosynthesis guided biopsy is recommended and will be scheduled. RECOMMENDATION: 1. Right breast ultrasound-guided core biopsy at the 12 o'clock position. 2. Right axillary lymph node ultrasound-guided core biopsy. 3. Left breast tomosynthesis guided biopsy of questionable subtle distortion. I have discussed the findings and recommendations with the patient. Results were also provided in writing at the  conclusion of the visit. If applicable, a reminder letter will be sent to the patient regarding the next appointment. BI-RADS CATEGORY  5: Highly suggestive of malignancy. Electronically Signed   By: Altamese Cabal M.D.   On: 08/23/2015 17:08   Mm Screening Breast Tomo Bilateral  08/18/2015  CLINICAL DATA:  Screening. EXAM: 2D DIGITAL SCREENING BILATERAL MAMMOGRAM WITH CAD AND ADJUNCT TOMO COMPARISON:  Previous exam(s). ACR Breast Density Category b: There are scattered areas of fibroglandular density. FINDINGS: In the right breast a breast mass and possible adenopathy require further evaluation. In the left breast possible distortion requires further evaluation. Images were processed with CAD. IMPRESSION: Further evaluation is suggested for possible right breast mass and possible right axillary adenopathy. Further evaluation is suggested for possible distortion in the left breast. RECOMMENDATION: Diagnostic mammogram and ultrasound of both breasts. (Code:[id number]) The patient will be contacted regarding the findings, and additional imaging will be scheduled. BI-RADS CATEGORY  0: Incomplete. Need additional imaging evaluation and/or prior mammograms for comparison. Electronically Signed   By: Nolon Nations M.D.   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 Area of subtle possible distortion located within the left breast anteriorly at the 1 o'clock position. Biopsy requested. EXAM: LEFT BREAST TOMOSYNTHESIS GUIDED CORE NEEDLE BIOPSY COMPARISON:  Previous exams. FINDINGS: The patient and I discussed the procedure of stereotactic-guided biopsy including benefits and alternatives. We discussed the high likelihood of a successful procedure. We discussed the risks of the procedure including infection, bleeding, tissue injury, clip  migration, and inadequate sampling. Informed written consent was given. The usual time out protocol was performed immediately prior to the procedure. Using sterile technique and 1% Lidocaine as local anesthetic, under stereotactic guidance, a 9 gauge vacuum assisted device was used to perform core needle biopsy of the area of subtle distortion located within the anterior portion of the left breast at the 1 o'clock position using a superior craniocaudal approach. No specimen mammogram was obtained. At the conclusion of the procedure, an X shaped tissue marker clip was deployed into the biopsy cavity. Follow-up 2-view mammogram was performed and dictated separately. IMPRESSION: Tomosynthesis guided biopsy of subtle possible left breast distortion. No apparent complications. Electronically Signed   By: Altamese Cabal M.D.   On: 08/24/2015 14:07   [name] Rt  Breast Bx W Loc Dev 1st Lesion Img Bx Spec US Guide  08/23/2015  CLINICAL DATA:  Right breast mass and enlarged right axillary lymph nodes. EXAM: ULTRASOUND GUIDED RIGHT BREAST CORE NEEDLE BIOPSY COMPARISON:  Previous exam(s). FINDINGS: I met with the patient and we discussed the procedure of ultrasound-guided biopsy, including benefits and alternatives. We discussed the high likelihood of a successful procedure. We discussed the risks of the procedure, including infection, bleeding, tissue injury, clip migration, and inadequate sampling. Informed written consent was given. The usual time-out protocol was performed immediately prior to the procedure. Using sterile technique and 1% Lidocaine as local anesthetic, under direct ultrasound visualization, a 14 gauge spring-loaded device was used to perform biopsy of the spiculated mass located within the right breast at the 12 o'clock position using a medial approach. At the conclusion of the procedure a ribbon shaped tissue marker clip was deployed into the biopsy cavity. Follow up 2 view mammogram was performed and  dictated separately. IMPRESSION: Ultrasound guided biopsy of the right breast mass located the 12 o'clock position as discussed above. No apparent complications. Electronically Signed   By: Rolla Plate M.D.   On: 08/23/2015 17:15   [name] Rt Breast Bx W Loc Dev Ea Add Lesion Img Bx Spec US Guide  08/23/2015  CLINICAL DATA:  Right breast mass enlarged right axillary lymph nodes. EXAM: ULTRASOUND GUIDED RIGHT AXILLARY LYMPH NODE CORE NEEDLE BIOPSY COMPARISON:  Previous exam(s). FINDINGS: I met with the patient and we discussed the procedure of ultrasound-guided biopsy, including benefits and alternatives. We discussed the high likelihood of a successful procedure. We discussed the risks of the procedure, including infection, bleeding, tissue injury, clip migration, and inadequate sampling. Informed written consent was given. The usual time-out protocol was performed immediately prior to the procedure. Using sterile technique and 1% Lidocaine as local anesthetic, under direct ultrasound visualization, a 14 gauge spring-loaded device was used to perform biopsy of 1 of the enlarged right axillary lymph nodes using a lateral/inferior approach. At the conclusion of the procedure a spiral shaped HydroMARK tissue marker clip was deployed into the biopsy cavity. Follow up 2 view mammogram was performed and dictated separately. IMPRESSION: Ultrasound guided biopsy of enlarged right axillary lymph node as discussed above. No apparent complications. Electronically Signed   By: Rolla Plate M.D.   On: 08/23/2015 17:17    ASSESSMENT: 70 y.o. [place] woman status post right breast upper outer quadrant and right axillary lymph node biopsy 08/23/2015, both positive for a clinical T1 N2, stage IIIA invasive ductal carcinoma, grade 2, strongly estrogen receptor positive, 5% progesterone receptor positive, with an MIB-120-30% and no HER-2 amplification  (1) left breast biopsy 08/24/2015 shows a complex sclerosing  lesion  (2) neoadjuvant chemotherapy will consist of cyclophosphamide and docetaxel every 21 days 4, with OnPro support  (3) definitive surgery to follow, likely including full axillary lymph node dissection  (4) adjuvant radiation to follow surgery  (5) anti-estrogens for 10 years to follow at the completion of local treatment  (6) 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 Because of [name]'s past history of non-Hodgkin's lymphoma, it was important to emphasize that there are more than 100 types of cancer, each a separate and distinct disease, each diagnosed and treated separately.  We then discussed [name]'s situation. She understands that on the left [name] she has  a precancerous condition that requires only excision.  On the right however she has a locally advanced tumor, with the significant axillary adenopathy making it stage III. This means she is at high risk of already having disseminated tumor spread although we expect this will be microscopic if it's they are at all since she gets is no specific symptoms suggestive of metastatic disease. We are going to obtain staging studies including a chest CT and bone scan and follow-up with a PET scan if there are any restaurant regarding those studies (the patient does have a history of fibromyalgia and arthritis and that can lead to some confusion).   We discussed treatment for breast cancer and in terms of local treatment options, we emphasized that there is no survival difference between lumpectomy and mastectomy. In [name]'s case we think a lumpectomy may be possible especially if we treated her neoadjuvantly. We discussed the fact that there is no survival advantage to doing chemotherapy first versus surgery first. After this discussion the patient was agreeable and we will  start with neoadjuvant chemotherapy.   Whether the patient ends up with a lumpectomy or mastectomy, I think she will need a full axillary lymph node dissection. I do not believe she meets criteria for the Alliance trial (I have queried our Research nurse to make sure). Even if she has a mastectomy she will also need adjuvant radiation.   [name] does meet criteria for genetic testing given her Ashkenazi background. Note that her sister with breast cancer tested negative for a BRCA gene mutation. I do not know whether the patient's sister had a full panel or only founder mutation screening.  In terms of systemic therapy she will receive cyclophosphamide and docetaxel neoadjuvantly. We discussed the possible toxicities, [name] effects and complications of these agents and she will also come to "chemotherapy school". The plan is for a total of 4 cycles given 3 weeks apart beginning 09/12/2015. The patient will need a port.  We discussed the fact that her breast cancer is not terribly aggressive or terribly fast growing. In my experience and that of the general literature it is rare to have a complete pathologic response in estrogen receptor positive breast cancers. What the chemotherapy aims to do is sterilize the more aggressive disease. We then deal with the less aggressive disease with local treatment followed by anti-estrogens which in her case will be long-term (10 years).  [name] has a good understanding of the overall plan. She agrees with it. She knows the goal of treatment in her case is cure. She will call with any problems that may develop before her next visit here.  Chauncey Cruel, MD   09/01/2015 6:42 AM Medical Oncology and Hematology [facility] [address] [place], [place] [address] Tel. [phone]    Fax. [phone]

## 2015-09-01 ENCOUNTER — Telehealth: Payer: Self-pay | Admitting: Oncology

## 2015-09-01 ENCOUNTER — Encounter: Payer: Self-pay | Admitting: *Deleted

## 2015-09-01 ENCOUNTER — Other Ambulatory Visit: Payer: Self-pay | Admitting: General Surgery

## 2015-09-01 ENCOUNTER — Other Ambulatory Visit: Payer: Self-pay

## 2015-09-01 DIAGNOSIS — C50411 Malignant neoplasm of upper-outer quadrant of right female breast: Secondary | ICD-10-CM

## 2015-09-01 DIAGNOSIS — N6022 Fibroadenosis of left breast: Secondary | ICD-10-CM | POA: Insufficient documentation

## 2015-09-01 DIAGNOSIS — Z170 Estrogen receptor positive status [ER+]: Secondary | ICD-10-CM

## 2015-09-01 MED ORDER — ONDANSETRON HCL 8 MG PO TABS: 8.0000 mg | ORAL_TABLET | Freq: Two times a day (BID) | ORAL | Status: DC | PRN

## 2015-09-01 MED ORDER — TERCONAZOLE 0.4 % VA CREA: 1.0000 | TOPICAL_CREAM | Freq: Every day | VAGINAL | Status: DC

## 2015-09-01 MED ORDER — DEXAMETHASONE 4 MG PO TABS: 8.0000 mg | ORAL_TABLET | Freq: Two times a day (BID) | ORAL | Status: DC

## 2015-09-01 MED ORDER — LORAZEPAM 0.5 MG PO TABS: 0.5000 mg | ORAL_TABLET | Freq: Every evening | ORAL | Status: DC | PRN

## 2015-09-01 MED ORDER — PROCHLORPERAZINE MALEATE 10 MG PO TABS: 10.0000 mg | ORAL_TABLET | Freq: Four times a day (QID) | ORAL | Status: DC | PRN

## 2015-09-01 MED ORDER — LIDOCAINE-PRILOCAINE 2.5-2.5 % EX CREA: TOPICAL_CREAM | CUTANEOUS | Status: DC

## 2015-09-01 NOTE — Telephone Encounter (Signed)
Spoke with patient to confirm upcoming appt date/times

## 2015-09-01 NOTE — Progress Notes (Signed)
09/01/2015 Received referral inquiry from Dr. Jana Hakim for the Alliance [phone] surgical study. Confirmed that patient is not eligible due to the following:  The eligibility states: 4.1.2 Clinical stage T1-3 N1 M0 breast cancer at diagnosis (prior to the start of neoadjuvant chemotherapy) by AJCC staging 7th edition.  N2 nodes (Metastases in ipsilateral level I, II axillary lymph nodes that are clinically fixed or matted;) are not eligible.  Dr. Jana Hakim notified of ineligibility via email.  Cindy S. Brigitte Pulse BSN, RN, [place] 09/01/2015 12:13 PM

## 2015-09-05 DIAGNOSIS — H903 Sensorineural hearing loss, bilateral: Secondary | ICD-10-CM | POA: Insufficient documentation

## 2015-09-06 ENCOUNTER — Encounter (HOSPITAL_COMMUNITY)
Admission: RE | Admit: 2015-09-06 | Discharge: 2015-09-06 | Disposition: A | Discharge status: Home / Self Care | Payer: Medicare Other | Source: Ambulatory Visit | Attending: Oncology | Admitting: Oncology

## 2015-09-06 ENCOUNTER — Other Ambulatory Visit: Payer: Medicare Other

## 2015-09-06 ENCOUNTER — Encounter (HOSPITAL_COMMUNITY): Payer: Self-pay

## 2015-09-06 ENCOUNTER — Encounter: Payer: Self-pay | Admitting: *Deleted

## 2015-09-06 ENCOUNTER — Ambulatory Visit (HOSPITAL_COMMUNITY)
Admission: RE | Admit: 2015-09-06 | Discharge: 2015-09-06 | Disposition: A | Discharge status: Home / Self Care | Payer: Medicare Other | Source: Ambulatory Visit | Attending: Oncology | Admitting: Oncology

## 2015-09-06 DIAGNOSIS — N6022 Fibroadenosis of left breast: Secondary | ICD-10-CM | POA: Diagnosis not present

## 2015-09-06 DIAGNOSIS — R918 Other nonspecific abnormal finding of lung field: Secondary | ICD-10-CM | POA: Insufficient documentation

## 2015-09-06 DIAGNOSIS — N3010 Interstitial cystitis (chronic) without hematuria: Secondary | ICD-10-CM

## 2015-09-06 DIAGNOSIS — M797 Fibromyalgia: Secondary | ICD-10-CM

## 2015-09-06 DIAGNOSIS — C50411 Malignant neoplasm of upper-outer quadrant of right female breast: Secondary | ICD-10-CM | POA: Insufficient documentation

## 2015-09-06 DIAGNOSIS — M810 Age-related osteoporosis without current pathological fracture: Secondary | ICD-10-CM | POA: Diagnosis not present

## 2015-09-06 DIAGNOSIS — C773 Secondary and unspecified malignant neoplasm of axilla and upper limb lymph nodes: Secondary | ICD-10-CM | POA: Diagnosis not present

## 2015-09-06 MED ORDER — TECHNETIUM TC 99M MEDRONATE IV KIT
26.0000 | PACK | Freq: Once | INTRAVENOUS | Status: AC | PRN
  Administered 2015-09-06: 26 via INTRAVENOUS

## 2015-09-06 MED ORDER — IOPAMIDOL (ISOVUE-300) INJECTION 61%
75.0000 mL | Freq: Once | INTRAVENOUS | Status: AC | PRN
  Administered 2015-09-06: 75 mL via INTRAVENOUS

## 2015-09-06 NOTE — H&P (Signed)
[name]  Location: [facility] Patient #: [id number] DOB: March 21, 1946 Married / Language: English / Race: White Female      History of Present Illness  .  The patient is a 70 year old female who presents with breast cancer. This is a pleasant 70 year old Caucasian female. Well-known to me from prior cholecystectomy. She was a Astronomer at [employer]. Her husband is with her today. She was referred by Dr. Luberta Robertson for evaluation of a new cancer in the right breast, 12 position with right axillary lymph node metastasis. Dr. Jori [name] polite is her PCP. Dr. Alona [name] is her urologist. Dr. Amil [name] is her urologist. She is scheduled to see Dr. Gunnar Bulla [name] for initial consultation neck Wednesday.  She gets mammograms every other year. Her last mammogram was actually 3 years ago. No prior history of breast problems. Recent mammogram and ultrasound show a fullness in the right breast at the 12 o'clock position and palpable lymph nodes in the right axilla. Imaging studies show a 1.6 cm mass in the right breast at the 12 o'clock position 3 cm from the nipple. It also shows abnormal right axillary lymph nodes. There is also a area of distortion in the left breast, superior laterally near the nipple.  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 Image guided biopsy of one of the right axillary lymph nodes is positive for ductal carcinoma. Image guided biopsy of the left breast density shows complex sclerosing lesion with calcifications and possible radial scar. She knows that this left-sided lesion will need to be excised, probably at the time of her definitive right breast surgery.  Past history is reveals mild asthma. Mild depression. Sciatica. Fibromyalgia, interstitial cystitis. She had cervical oral surgeries in the last year with  implants. Denies heart disease lung disease diabetes or renal disease.  Family history reveals breast cancer in her sister but states that this sister's genetic testing was -5 years ago. No other breast cancer ovarian cancer pancreatic cancer or colon cancer in the family.  Physical history reveals that her husband is with her. He had some type of malignancy with bone marrow transplant managed by Dr. Fatima Sanger for [facility] and did well. The patient is a retired Psychologist, occupational at [employer]. 2 children. Denies tobacco. Rare alcohol.  We talked about the strategy for management of her breast cancer. I told her I was suspicious that she has multiple right axillary lymph node metastasis. I told her that I thought the best approach was to proceed with breast MRI and see Dr. Jana [name] next week with the thought that she would have a port placed and had neoadjuvant chemotherapy. I told her that Dr. Jana [name] would decide about staging studies. I told her Dr. Jana [name] were decide whether to refer her to genetics or not. I told her that she might be a candidate for lumpectomy and would we would leave that open. We will see what the MRI showed to make sure she doesn't have multicentric disease. I told her we would decide whether she needs lumpectomy or mastectomy at a later date. She knows that she will need axillary lymph node surgery. I told her that she would likely need radiation therapy regardless of surgical technique.  We talked about the indications details techniques and numerous risk of Port-A-Cath insertion. She is aware of the risk of bleeding, infection, malfunction requiring revision, air embolus, pneumothorax, and  other unforeseen problems. She and her husband understand all these issues very well because of his prior experience. All of her questions are answered. She agrees with this plan.  At this point we are going to schedule her MRI and schedule an appointment to see me in 2 weeks. We  will schedule a Port-A-Cath insertion as soon as Dr. Jana [name] makes the decision.   Allergies Penicillin G Pot in Dextrose *PENICILLINS* Diflucan *ANTIFUNGALS* Macrodantin *Urinary Anti-infectives**  Medication History  Hydrocodone-Acetaminophen (10-325MG Tablet, Oral) Active. Meperidine HCl (50MG Tablet, Oral) Active. Carisoprodol (350MG Tablet, Oral) Active. DiazePAM (5MG Tablet, Oral) Active. LORazepam (2MG Tablet, Oral) Active. Azithromycin (250MG Tablet, Oral) Active. BuPROPion HCl ER (XL) (300MG Tablet ER 24HR, Oral) Active. Calcitonin ([place]) (200UNIT/ACT Solution, Nasal) Active. Clindamycin HCl (150MG Capsule, Oral) Active. HydrOXYzine HCl (25MG Tablet, Oral) Active. Pantoprazole Sodium (40MG Tablet DR, Oral) Active. PredniSONE (10MG (21) Tab Ther Pack, Oral) Active. Promethazine HCl (25MG Tablet, Oral) Active. Sulfamethoxazole-Trimethoprim (800-160MG Tablet, Oral) Active. Synthroid (137MCG Tablet, Oral) Active. Triamterene-HCTZ (75-50MG Tablet, Oral) Active. ValACYclovir HCl (1GM Tablet, Oral) Active. Vitamin D (Ergocalciferol) (50000UNIT Capsule, Oral) Active. Medications Reconciled  Vitals   Weight: 172 lb Height: 63.5in Body Surface Area: 1.82 m Body Mass Index: 29.99 kg/m  Temp.: 97.66F  Pulse: 75 (Regular)  BP: 148/90 (Sitting, Left Arm, Standard)    Physical Exam  General Mental Status-Alert. General Appearance-Consistent with stated age. Hydration-Well hydrated. Voice-Normal.  Head and Neck Head-normocephalic, atraumatic with no lesions or palpable masses. Trachea-midline. Thyroid Gland Characteristics - normal size and consistency.  Eye Eyeball - Bilateral-Extraocular movements intact. Sclera/Conjunctiva - Bilateral-No scleral icterus.  Chest and Lung Exam Chest and lung exam reveals -quiet, even and easy respiratory effort with no use of accessory muscles and on auscultation, normal breath  sounds, no adventitious sounds and normal vocal resonance. Inspection Chest Wall - Normal. Back - normal.  Breast Note: There are some palpable mobile lymph node in the right axilla. A little tender from previous biopsy. Ecchymoses in the right breast. Small palpable density at 12:00. No other masses in either breast. Biopsy site on the left side. No axillary mass and left. Breasts are small to medium size.   Cardiovascular Cardiovascular examination reveals -normal heart sounds, regular rate and rhythm with no murmurs and normal pedal pulses bilaterally.  Abdomen Inspection Inspection of the abdomen reveals - No Hernias. Palpation/Percussion Palpation and Percussion of the abdomen reveal - Soft, Non Tender, No Rebound tenderness, No Rigidity (guarding) and No hepatosplenomegaly. Auscultation Auscultation of the abdomen reveals - Bowel sounds normal. Note: Laparoscopic scars from cholecystectomy. Otherwise abdomen benign   Neurologic Neurologic evaluation reveals -alert and oriented x 3 with no impairment of recent or remote memory. Mental Status-Normal.  Musculoskeletal Normal Exam - Left-Upper Extremity Strength Normal and Lower Extremity Strength Normal. Normal Exam - Right-Upper Extremity Strength Normal and Lower Extremity Strength Normal.  Lymphatic Head & Neck  General Head & Neck Lymphatics: Bilateral - Description - Normal. Axillary  General Axillary Region: Bilateral - Description - Normal. Tenderness - Non Tender. Femoral & Inguinal  Generalized Femoral & Inguinal Lymphatics: Bilateral - Description - Normal. Tenderness - Non Tender.    Assessment & Plan ( CANCER OF OVERLAPPING SITES OF RIGHT FEMALE BREAST (C50.811)   Your recent imaging studies and biopsies showed invasive cancer in the right breast at the 12 o'clock position. This is not a large cancer. However, the cancer has spread to at least one of your right axillary lymph nodes. There may  be  more than one lymph node that has cancer in it. We are waiting to hear the results of the hormone receptor test and the HER-2 test. You also had a biopsy of the left breast which shows a complex sclerosing lesion. This will need to be excised at the time of definitive right breast surgery. This carries a 5% risk of cancer but is probably benign.       Because of the spread of your cancer to the lymph nodes, I think that the next step in your treatment is to see Dr. Jana [name] next Wednesday. He will decide if you need staging studies such as bone scans or PET scans. I think that the best course of treatment would be to insert a Port-A-Cath and treat you with chemotherapy before you have definitive breast surgery. We called this neoadjuvant chemotherapy I will wait to hear from Dr. Jana [name] to be sure he agrees. Once I hear from him we will schedule the Port-A-Cath insertion as discussed      It is too soon to decide whether you will need to have a mastectomy or lumpectomy. Right now I think lumpectomy remains an option. We will decide later We talked about plastic surgical consultation, which will become appropriate if you choose mastectomy or we decide that is what you need.     I will also schedule for bilateral breast MRI as a baseline and to make sure we have carefully assess the extent of your disease  Your sister had breast cancer but reportedly had negative genetic testing 5 years ago. Dr. Jana [name] will decide if you need genetic testing herself.  you will be given an appointment to see Dr. Dalbert Batman in 2 weeks, but if we schedule the Port-A-Cath that appointment may not be necessary Dr. Dalbert Batman will follow you carefully throughout the course of your preoperative chemotherapy  METASTATIC CANCER TO AXILLARY LYMPH NODES (C77.3) FIBROMYALGIA (M79.7) INTERSTITIAL CYSTITIS (N30.10) MILD DEPRESSIVE DISORDER (F32.0) CHRONIC GERD (K21.9) HYPERTENSION, BENIGN (I10) ABNORMAL MAMMOGRAM OF LEFT BREAST  (R92.8) Impression: Biopsy shows complex sclerosing lesion, possible radial scar. Will need to be excised at the time of definitive right breast surgery.    Edsel Petrin. Dalbert Batman, M.D., [facility] General and Minimally invasive Surgery Breast and Colorectal Surgery Office:   [phone] Pager:   [phone]

## 2015-09-07 ENCOUNTER — Encounter (HOSPITAL_COMMUNITY): Payer: Self-pay

## 2015-09-07 ENCOUNTER — Encounter (HOSPITAL_COMMUNITY)
Admission: RE | Admit: 2015-09-07 | Discharge: 2015-09-07 | Disposition: A | Discharge status: Home / Self Care | Payer: Medicare Other | Source: Ambulatory Visit | Attending: General Surgery | Admitting: General Surgery

## 2015-09-07 DIAGNOSIS — C50919 Malignant neoplasm of unspecified site of unspecified female breast: Secondary | ICD-10-CM | POA: Diagnosis present

## 2015-09-07 DIAGNOSIS — K219 Gastro-esophageal reflux disease without esophagitis: Secondary | ICD-10-CM | POA: Diagnosis not present

## 2015-09-07 DIAGNOSIS — Z7952 Long term (current) use of systemic steroids: Secondary | ICD-10-CM | POA: Diagnosis not present

## 2015-09-07 DIAGNOSIS — I10 Essential (primary) hypertension: Secondary | ICD-10-CM | POA: Diagnosis not present

## 2015-09-07 DIAGNOSIS — C50911 Malignant neoplasm of unspecified site of right female breast: Secondary | ICD-10-CM | POA: Diagnosis not present

## 2015-09-07 DIAGNOSIS — Z79899 Other long term (current) drug therapy: Secondary | ICD-10-CM | POA: Diagnosis not present

## 2015-09-07 DIAGNOSIS — Z803 Family history of malignant neoplasm of breast: Secondary | ICD-10-CM | POA: Diagnosis not present

## 2015-09-07 DIAGNOSIS — M797 Fibromyalgia: Secondary | ICD-10-CM | POA: Diagnosis not present

## 2015-09-07 DIAGNOSIS — Z87891 Personal history of nicotine dependence: Secondary | ICD-10-CM | POA: Diagnosis not present

## 2015-09-07 DIAGNOSIS — E039 Hypothyroidism, unspecified: Secondary | ICD-10-CM | POA: Diagnosis not present

## 2015-09-07 DIAGNOSIS — F329 Major depressive disorder, single episode, unspecified: Secondary | ICD-10-CM | POA: Diagnosis not present

## 2015-09-07 DIAGNOSIS — C773 Secondary and unspecified malignant neoplasm of axilla and upper limb lymph nodes: Secondary | ICD-10-CM | POA: Diagnosis not present

## 2015-09-07 DIAGNOSIS — Z79891 Long term (current) use of opiate analgesic: Secondary | ICD-10-CM | POA: Diagnosis not present

## 2015-09-07 HISTORY — DX: Malignant (primary) neoplasm, unspecified: C80.1

## 2015-09-07 HISTORY — DX: Nausea with vomiting, unspecified: R11.2

## 2015-09-07 HISTORY — DX: Pneumonia, unspecified organism: J18.9

## 2015-09-07 HISTORY — DX: Other specified postprocedural states: Z98.890

## 2015-09-07 NOTE — Progress Notes (Signed)
Pt denies SOB, chest pain, and being under the care of a cardiologist. Pt denies having stress test, echo, and cardiac cath. Pt denies having an EKG and chest x ray within the last year. Pt advised okay to eat up until 4:00 A.M.by MD staff.

## 2015-09-07 NOTE — Pre-Procedure Instructions (Signed)
    [name]  09/07/2015      [id number] - [place], [place] - [address] [place] [address] Phone: [phone] Fax: [phone]  [facility], [place] [place] [address] Phone: [phone] Fax: [phone]    Your procedure is scheduled on Thursday, Sep 08, 2015  Report to [facility] Admitting at 11:30 A.M.  Call this number if you have problems the morning of surgery:  [phone]   Remember:  Do not eat food or drink liquids after midnight.  Take these medicines the morning of surgery with A SIP OF WATER : buPROPion (WELLBUTRIN XL), Levothyroxine Sodium (SYNTHROID ), pantoprazole (PROTONIX), calcitonin, salmon, (MIACALCIN/FORTICAL)  nasal spray if needed:diazepam (VALIUM) for anxiety, Almotriptan Malate (AXERT), HYDROcodone-acetaminophen (NORCO) for pain, hydrOXYzine (ATARAX/VISTARIL) for itching Stop taking Aspirin, vitamins, fish oil, and herbal medications. Do not take any NSAIDs ie: Ibuprofen, Advil, Naproxen, BC, Goody Powder, nabumetone (RELAFEN,  or any medication containing Aspirin; stop now.  Do not wear jewelry, make-up or nail polish.  Do not wear lotions, powders, or perfumes.  You may not wear deodorant.  Do not shave 48 hours prior to surgery.    Do not bring valuables to the hospital.  [facility] is not responsible for any belongings or valuables.  Contacts, dentures or bridgework may not be worn into surgery.  Leave your suitcase in the car.  After surgery it may be brought to your room.  For patients admitted to the hospital, discharge time will be determined by your treatment team.  Patients discharged the day of surgery will not be allowed to drive home.   Name and phone number of your driver:  Special instructions: Shower the night before surgery and the morning of surgery with CHG.  Please read over  the following fact sheets that you were given. Pain Booklet, Coughing and Deep Breathing and Surgical Site Infection Prevention

## 2015-09-08 ENCOUNTER — Ambulatory Visit (HOSPITAL_COMMUNITY): Payer: Medicare Other

## 2015-09-08 ENCOUNTER — Ambulatory Visit (HOSPITAL_COMMUNITY)
Admission: RE | Admit: 2015-09-08 | Discharge: 2015-09-08 | Disposition: A | Discharge status: Home / Self Care | Payer: Medicare Other | Source: Ambulatory Visit | Attending: General Surgery | Admitting: General Surgery

## 2015-09-08 ENCOUNTER — Encounter (HOSPITAL_COMMUNITY)
Admission: RE | Disposition: A | Discharge status: Home / Self Care | Payer: Self-pay | Source: Ambulatory Visit | Attending: General Surgery

## 2015-09-08 ENCOUNTER — Other Ambulatory Visit: Payer: Self-pay

## 2015-09-08 ENCOUNTER — Encounter: Payer: Self-pay | Admitting: Genetic Counselor

## 2015-09-08 ENCOUNTER — Ambulatory Visit (HOSPITAL_COMMUNITY): Payer: Medicare Other | Admitting: Emergency Medicine

## 2015-09-08 ENCOUNTER — Encounter (HOSPITAL_COMMUNITY): Payer: Self-pay | Admitting: *Deleted

## 2015-09-08 ENCOUNTER — Ambulatory Visit (HOSPITAL_COMMUNITY): Payer: Medicare Other | Admitting: Certified Registered Nurse Anesthetist

## 2015-09-08 DIAGNOSIS — I10 Essential (primary) hypertension: Secondary | ICD-10-CM | POA: Insufficient documentation

## 2015-09-08 DIAGNOSIS — Z803 Family history of malignant neoplasm of breast: Secondary | ICD-10-CM | POA: Insufficient documentation

## 2015-09-08 DIAGNOSIS — K219 Gastro-esophageal reflux disease without esophagitis: Secondary | ICD-10-CM | POA: Insufficient documentation

## 2015-09-08 DIAGNOSIS — C50411 Malignant neoplasm of upper-outer quadrant of right female breast: Secondary | ICD-10-CM | POA: Diagnosis present

## 2015-09-08 DIAGNOSIS — Z87891 Personal history of nicotine dependence: Secondary | ICD-10-CM | POA: Insufficient documentation

## 2015-09-08 DIAGNOSIS — Z79899 Other long term (current) drug therapy: Secondary | ICD-10-CM | POA: Insufficient documentation

## 2015-09-08 DIAGNOSIS — F329 Major depressive disorder, single episode, unspecified: Secondary | ICD-10-CM | POA: Insufficient documentation

## 2015-09-08 DIAGNOSIS — Z95828 Presence of other vascular implants and grafts: Secondary | ICD-10-CM

## 2015-09-08 DIAGNOSIS — Z419 Encounter for procedure for purposes other than remedying health state, unspecified: Secondary | ICD-10-CM

## 2015-09-08 DIAGNOSIS — Z170 Estrogen receptor positive status [ER+]: Secondary | ICD-10-CM | POA: Diagnosis present

## 2015-09-08 DIAGNOSIS — C50911 Malignant neoplasm of unspecified site of right female breast: Secondary | ICD-10-CM | POA: Insufficient documentation

## 2015-09-08 DIAGNOSIS — Z7952 Long term (current) use of systemic steroids: Secondary | ICD-10-CM | POA: Insufficient documentation

## 2015-09-08 DIAGNOSIS — C773 Secondary and unspecified malignant neoplasm of axilla and upper limb lymph nodes: Secondary | ICD-10-CM | POA: Insufficient documentation

## 2015-09-08 DIAGNOSIS — E039 Hypothyroidism, unspecified: Secondary | ICD-10-CM | POA: Insufficient documentation

## 2015-09-08 DIAGNOSIS — M797 Fibromyalgia: Secondary | ICD-10-CM | POA: Insufficient documentation

## 2015-09-08 DIAGNOSIS — Z79891 Long term (current) use of opiate analgesic: Secondary | ICD-10-CM | POA: Insufficient documentation

## 2015-09-08 HISTORY — PX: PORTACATH PLACEMENT: SHX2246

## 2015-09-08 SURGERY — INSERTION, TUNNELED CENTRAL VENOUS DEVICE, WITH PORT
Anesthesia: General | Site: Chest | Laterality: Left

## 2015-09-08 MED ORDER — PROTAMINE SULFATE 10 MG/ML IV SOLN
INTRAVENOUS | Status: AC
  Filled 2015-09-08: qty 5

## 2015-09-08 MED ORDER — PHENYLEPHRINE 40 MCG/ML (10ML) SYRINGE FOR IV PUSH (FOR BLOOD PRESSURE SUPPORT)
PREFILLED_SYRINGE | INTRAVENOUS | Status: AC
  Filled 2015-09-08: qty 10

## 2015-09-08 MED ORDER — FENTANYL CITRATE (PF) 100 MCG/2ML IJ SOLN
INTRAMUSCULAR | Status: AC
  Administered 2015-09-08: 50 ug via INTRAVENOUS
  Filled 2015-09-08: qty 2

## 2015-09-08 MED ORDER — FENTANYL CITRATE (PF) 100 MCG/2ML IJ SOLN
INTRAMUSCULAR | Status: DC | PRN
  Administered 2015-09-08: 50 ug via INTRAVENOUS
  Administered 2015-09-08: 25 ug via INTRAVENOUS

## 2015-09-08 MED ORDER — HEPARIN SOD (PORK) LOCK FLUSH 100 UNIT/ML IV SOLN
INTRAVENOUS | Status: DC | PRN
  Administered 2015-09-08: 400 [IU]

## 2015-09-08 MED ORDER — ACETAMINOPHEN 160 MG/5ML PO SOLN
325.0000 mg | ORAL | Status: DC | PRN
  Filled 2015-09-08: qty 20.3

## 2015-09-08 MED ORDER — ONDANSETRON HCL 4 MG/2ML IJ SOLN
INTRAMUSCULAR | Status: DC | PRN
  Administered 2015-09-08: 4 mg via INTRAVENOUS

## 2015-09-08 MED ORDER — PROPOFOL 10 MG/ML IV BOLUS
INTRAVENOUS | Status: AC
  Filled 2015-09-08: qty 20

## 2015-09-08 MED ORDER — MIDAZOLAM HCL 2 MG/2ML IJ SOLN
0.5000 mg | Freq: Once | INTRAMUSCULAR | Status: AC
  Administered 2015-09-08: 1 mg via INTRAVENOUS

## 2015-09-08 MED ORDER — ACETAMINOPHEN 325 MG PO TABS
ORAL_TABLET | ORAL | Status: AC
  Administered 2015-09-08: 650 mg via ORAL
  Filled 2015-09-08: qty 2

## 2015-09-08 MED ORDER — SUGAMMADEX SODIUM 200 MG/2ML IV SOLN
INTRAVENOUS | Status: AC
  Filled 2015-09-08: qty 2

## 2015-09-08 MED ORDER — FENTANYL CITRATE (PF) 100 MCG/2ML IJ SOLN
25.0000 ug | INTRAMUSCULAR | Status: DC | PRN
  Administered 2015-09-08 (×3): 50 ug via INTRAVENOUS

## 2015-09-08 MED ORDER — VANCOMYCIN HCL IN DEXTROSE 1-5 GM/200ML-% IV SOLN: 1000.0000 mg | INTRAVENOUS | Status: DC

## 2015-09-08 MED ORDER — VANCOMYCIN HCL IN DEXTROSE 1-5 GM/200ML-% IV SOLN
INTRAVENOUS | Status: AC
  Administered 2015-09-08: 1000 mg via INTRAVENOUS
  Filled 2015-09-08: qty 200

## 2015-09-08 MED ORDER — LIDOCAINE 2% (20 MG/ML) 5 ML SYRINGE
INTRAMUSCULAR | Status: AC
  Filled 2015-09-08: qty 5

## 2015-09-08 MED ORDER — BUPIVACAINE-EPINEPHRINE (PF) 0.5% -1:200000 IJ SOLN
INTRAMUSCULAR | Status: AC
  Filled 2015-09-08: qty 30

## 2015-09-08 MED ORDER — OXYCODONE-ACETAMINOPHEN 7.5-325 MG PO TABS: 1.0000 | ORAL_TABLET | ORAL | Status: DC | PRN

## 2015-09-08 MED ORDER — MIDAZOLAM HCL 2 MG/2ML IJ SOLN
INTRAMUSCULAR | Status: AC
  Filled 2015-09-08: qty 2

## 2015-09-08 MED ORDER — ROCURONIUM BROMIDE 50 MG/5ML IV SOLN
INTRAVENOUS | Status: AC
  Filled 2015-09-08: qty 1

## 2015-09-08 MED ORDER — OXYCODONE HCL 5 MG PO TABS: 5.0000 mg | ORAL_TABLET | Freq: Once | ORAL | Status: DC | PRN

## 2015-09-08 MED ORDER — SODIUM CHLORIDE 0.9 % IV SOLN: 250.0000 mL | INTRAVENOUS | Status: DC | PRN

## 2015-09-08 MED ORDER — LIDOCAINE-EPINEPHRINE (PF) 1 %-1:200000 IJ SOLN
INTRAMUSCULAR | Status: AC
  Filled 2015-09-08: qty 30

## 2015-09-08 MED ORDER — SODIUM CHLORIDE 0.9 % IV SOLN
INTRAVENOUS | Status: DC | PRN
  Administered 2015-09-08: 500 mL

## 2015-09-08 MED ORDER — BUPIVACAINE-EPINEPHRINE 0.5% -1:200000 IJ SOLN
INTRAMUSCULAR | Status: DC | PRN
  Administered 2015-09-08: 7 mL

## 2015-09-08 MED ORDER — 0.9 % SODIUM CHLORIDE (POUR BTL) OPTIME
TOPICAL | Status: DC | PRN
  Administered 2015-09-08: 1000 mL

## 2015-09-08 MED ORDER — HEPARIN SOD (PORK) LOCK FLUSH 100 UNIT/ML IV SOLN
INTRAVENOUS | Status: AC
  Filled 2015-09-08: qty 5

## 2015-09-08 MED ORDER — PHENYLEPHRINE HCL 10 MG/ML IJ SOLN
INTRAMUSCULAR | Status: DC | PRN
  Administered 2015-09-08: 40 ug via INTRAVENOUS
  Administered 2015-09-08: 80 ug via INTRAVENOUS
  Administered 2015-09-08: 40 ug via INTRAVENOUS

## 2015-09-08 MED ORDER — MIDAZOLAM HCL 5 MG/5ML IJ SOLN
INTRAMUSCULAR | Status: DC | PRN
  Administered 2015-09-08 (×2): 1 mg via INTRAVENOUS

## 2015-09-08 MED ORDER — PROPOFOL 10 MG/ML IV BOLUS
INTRAVENOUS | Status: DC | PRN
  Administered 2015-09-08: 200 mg via INTRAVENOUS

## 2015-09-08 MED ORDER — LACTATED RINGERS IV SOLN
INTRAVENOUS | Status: DC
  Administered 2015-09-08 (×2): via INTRAVENOUS

## 2015-09-08 MED ORDER — ACETAMINOPHEN 500 MG PO TABS: 1000.0000 mg | ORAL_TABLET | Freq: Four times a day (QID) | ORAL | Status: DC

## 2015-09-08 MED ORDER — ONDANSETRON HCL 4 MG/2ML IJ SOLN
INTRAMUSCULAR | Status: AC
  Filled 2015-09-08: qty 2

## 2015-09-08 MED ORDER — SODIUM CHLORIDE 0.9 % IV SOLN: INTRAVENOUS | Status: DC

## 2015-09-08 MED ORDER — ACETAMINOPHEN 325 MG PO TABS
325.0000 mg | ORAL_TABLET | ORAL | Status: DC | PRN
  Administered 2015-09-08: 650 mg via ORAL

## 2015-09-08 MED ORDER — CHLORHEXIDINE GLUCONATE 4 % EX LIQD: 1.0000 "application " | Freq: Once | CUTANEOUS | Status: DC

## 2015-09-08 MED ORDER — ACETAMINOPHEN 650 MG RE SUPP: 650.0000 mg | RECTAL | Status: DC | PRN

## 2015-09-08 MED ORDER — LIDOCAINE HCL (CARDIAC) 20 MG/ML IV SOLN
INTRAVENOUS | Status: DC | PRN
  Administered 2015-09-08: 60 mg via INTRAVENOUS

## 2015-09-08 MED ORDER — OXYCODONE HCL 5 MG PO TABS
ORAL_TABLET | ORAL | Status: AC
  Administered 2015-09-08: 10 mg via ORAL
  Filled 2015-09-08: qty 2

## 2015-09-08 MED ORDER — FENTANYL CITRATE (PF) 100 MCG/2ML IJ SOLN: 25.0000 ug | INTRAMUSCULAR | Status: DC | PRN

## 2015-09-08 MED ORDER — OXYCODONE HCL 5 MG/5ML PO SOLN: 5.0000 mg | Freq: Once | ORAL | Status: DC | PRN

## 2015-09-08 MED ORDER — OXYCODONE HCL 5 MG PO TABS
5.0000 mg | ORAL_TABLET | ORAL | Status: DC | PRN
  Administered 2015-09-08: 10 mg via ORAL

## 2015-09-08 MED ORDER — ESMOLOL HCL 100 MG/10ML IV SOLN
INTRAVENOUS | Status: AC
  Filled 2015-09-08: qty 10

## 2015-09-08 MED ORDER — SODIUM CHLORIDE 0.9% FLUSH: 3.0000 mL | INTRAVENOUS | Status: DC | PRN

## 2015-09-08 MED ORDER — FENTANYL CITRATE (PF) 250 MCG/5ML IJ SOLN
INTRAMUSCULAR | Status: AC
  Filled 2015-09-08: qty 5

## 2015-09-08 MED ORDER — IOPAMIDOL (ISOVUE-300) INJECTION 61%
INTRAVENOUS | Status: AC
  Filled 2015-09-08: qty 50

## 2015-09-08 MED ORDER — ACETAMINOPHEN 325 MG PO TABS: 650.0000 mg | ORAL_TABLET | ORAL | Status: DC | PRN

## 2015-09-08 MED ORDER — SODIUM CHLORIDE 0.9% FLUSH: 3.0000 mL | Freq: Two times a day (BID) | INTRAVENOUS | Status: DC

## 2015-09-08 SURGICAL SUPPLY — 53 items
ADH SKN CLS APL DERMABOND .7 (GAUZE/BANDAGES/DRESSINGS) ×1
BAG DECANTER FOR FLEXI CONT (MISCELLANEOUS) ×3 IMPLANT
BLADE SURG 11 STRL SS (BLADE) ×3 IMPLANT
BLADE SURG 15 STRL LF DISP TIS (BLADE) ×1 IMPLANT
BLADE SURG 15 STRL SS (BLADE) ×3
BLADE SURG ROTATE 9660 (MISCELLANEOUS) IMPLANT
CANISTER SUCTION 2500CC (MISCELLANEOUS) IMPLANT
CHLORAPREP W/TINT 26ML (MISCELLANEOUS) ×3 IMPLANT
COVER SURGICAL LIGHT HANDLE (MISCELLANEOUS) ×3 IMPLANT
COVER TRANSDUCER ULTRASND GEL (DRAPE) IMPLANT
CRADLE DONUT ADULT HEAD (MISCELLANEOUS) ×3 IMPLANT
DERMABOND ADVANCED (GAUZE/BANDAGES/DRESSINGS) ×2
DERMABOND ADVANCED .7 DNX12 (GAUZE/BANDAGES/DRESSINGS) ×1 IMPLANT
DRAPE C-ARM 42X72 X-RAY (DRAPES) ×3 IMPLANT
DRAPE LAPAROSCOPIC ABDOMINAL (DRAPES) ×3 IMPLANT
DRAPE UTILITY XL STRL (DRAPES) ×4 IMPLANT
ELECT CAUTERY BLADE 6.4 (BLADE) ×3 IMPLANT
ELECT REM PT RETURN 9FT ADLT (ELECTROSURGICAL) ×3
ELECTRODE REM PT RTRN 9FT ADLT (ELECTROSURGICAL) ×1 IMPLANT
GAUZE SPONGE 4X4 16PLY XRAY LF (GAUZE/BANDAGES/DRESSINGS) ×3 IMPLANT
GLOVE BIOGEL PI IND STRL 6.5 (GLOVE) IMPLANT
GLOVE BIOGEL PI INDICATOR 6.5 (GLOVE) ×2
GLOVE EUDERMIC 7 POWDERFREE (GLOVE) ×3 IMPLANT
GOWN STRL REUS W/ TWL LRG LVL3 (GOWN DISPOSABLE) ×1 IMPLANT
GOWN STRL REUS W/ TWL XL LVL3 (GOWN DISPOSABLE) ×1 IMPLANT
GOWN STRL REUS W/TWL LRG LVL3 (GOWN DISPOSABLE) ×3
GOWN STRL REUS W/TWL XL LVL3 (GOWN DISPOSABLE) ×3
INTRODUCER 13FR (MISCELLANEOUS) IMPLANT
INTRODUCER COOK 11FR (CATHETERS) IMPLANT
KIT BASIN OR (CUSTOM PROCEDURE TRAY) ×3 IMPLANT
KIT PORT POWER 8FR ISP CVUE (Catheter) ×2 IMPLANT
KIT ROOM TURNOVER OR (KITS) ×3 IMPLANT
NDL HYPO 25GX1X1/2 BEV (NEEDLE) ×2 IMPLANT
NEEDLE HYPO 25GX1X1/2 BEV (NEEDLE) ×6 IMPLANT
NS IRRIG 1000ML POUR BTL (IV SOLUTION) ×3 IMPLANT
PACK SURGICAL SETUP 50X90 (CUSTOM PROCEDURE TRAY) ×3 IMPLANT
PAD ARMBOARD 7.5X6 YLW CONV (MISCELLANEOUS) ×3 IMPLANT
PENCIL BUTTON HOLSTER BLD 10FT (ELECTRODE) ×3 IMPLANT
SET INTRODUCER 12FR PACEMAKER (SHEATH) IMPLANT
SET SHEATH INTRODUCER 10FR (MISCELLANEOUS) IMPLANT
SHEATH COOK PEEL AWAY SET 9F (SHEATH) IMPLANT
SURGILUBE 3G PEEL PACK STRL (MISCELLANEOUS) IMPLANT
SUT MNCRL AB 4-0 PS2 18 (SUTURE) ×3 IMPLANT
SUT PROLENE 2 0 CT2 30 (SUTURE) ×3 IMPLANT
SUT VIC AB 3-0 SH 18 (SUTURE) ×3 IMPLANT
SYR 5ML LUER SLIP (SYRINGE) ×3 IMPLANT
SYR CONTROL 10ML LL (SYRINGE) ×3 IMPLANT
SYRINGE 10CC LL (SYRINGE) ×6 IMPLANT
TOWEL OR 17X24 6PK STRL BLUE (TOWEL DISPOSABLE) ×3 IMPLANT
TOWEL OR 17X26 10 PK STRL BLUE (TOWEL DISPOSABLE) ×3 IMPLANT
TUBE CONNECTING 12'X1/4 (SUCTIONS)
TUBE CONNECTING 12X1/4 (SUCTIONS) IMPLANT
YANKAUER SUCT BULB TIP NO VENT (SUCTIONS) IMPLANT

## 2015-09-08 NOTE — Anesthesia Procedure Notes (Signed)
Procedure Name: LMA Insertion Date/Time: 09/08/2015 1:48 PM Performed by: Suzy Bouchard Pre-anesthesia Checklist: Patient identified, Timeout performed, Emergency Drugs available, Suction available and Patient being monitored Patient Re-evaluated:Patient Re-evaluated prior to inductionOxygen Delivery Method: Circle system utilized Preoxygenation: Pre-oxygenation with 100% oxygen Intubation Type: IV induction Ventilation: Mask ventilation without difficulty LMA: LMA inserted LMA Size: 4.0 Number of attempts: 1 Placement Confirmation: positive ETCO2 and breath sounds checked- equal and bilateral Tube secured with: Tape Dental Injury: Teeth and Oropharynx as per pre-operative assessment

## 2015-09-08 NOTE — Interval H&P Note (Signed)
History and Physical Interval Note:  09/08/2015 1:15 PM  [name]  has presented today for surgery, with the diagnosis of breast cancer  The various methods of treatment have been discussed with the patient and family. After consideration of risks, benefits and other options for treatment, the patient has consented to  Procedure(s): INSERTION PORT-A-CATH WITH ULTRASOUND (N/A) as a surgical intervention .  The patient's history has been reviewed, patient examined, no change in status, stable for surgery.  I have reviewed the patient's chart and labs.  Questions were answered to the patient's satisfaction.     Adin Hector

## 2015-09-08 NOTE — Op Note (Signed)
Patient Name:           [name]   Date of Surgery:        09/08/2015  Pre op Diagnosis:      Invasive cancer right breast, central location, with right axillary lymph node metastasis  Post op Diagnosis:    Same  Procedure:                 Insertion of 8 [place], power port, ClearVue  tunneled venous vascular access device                                      Use of fluoroscopy for guidance and positioning Surgeon:                     Edsel Petrin. Dalbert Batman, M.D., FACS  Assistant:                      OR staff  Operative Indications:    This is a pleasant 70 year old Caucasian female.   She was referred by Dr. Luberta Robertson for evaluation of a new cancer in the right breast, 12 position with right axillary lymph node metastasis. Dr. Seward Carol is her PCP. Dr. Alona Bene is her urologist. Dr. Amil Amen is her urologist.        Her last mammogram was actually 3 years ago. No prior history of breast problems. Recent mammogram and ultrasound show a fullness in the right breast at the 12 o'clock position and palpable lymph nodes in the right axilla. Imaging studies show a 1.6 cm mass in the right breast at the 12 o'clock position 3 cm from the nipple. It also shows abnormal right axillary lymph nodes. There is also a area of distortion in the left breast, superior laterally near the nipple.      Image guided biopsy of the right breast lesion at 12:00 shows invasive ductal carcinoma and ductal carcinoma in situ,  Image guided biopsy of one of the right axillary lymph nodes is positive for ductal carcinoma. Image guided biopsy of the left breast density shows complex sclerosing lesion with calcifications and possible radial scar. She knows that this left-sided lesion will need to be excised, probably at the time of her definitive right breast surgery.      Family history reveals breast cancer in her sister but states that this sister's genetic testing was -5 years ago. No other breast  cancer ovarian cancer pancreatic cancer or colon cancer in the family.     We talked about the strategy for management of her breast cancer. I told her I was suspicious that she has multiple right axillary lymph node metastasis.  I scheduled her for MRI.  I referred her to Dr. Jana Hakim who advised neoadjuvant chemotherapy.  She was referred back for Port-A-Cath insertion..  I told her that she might be a candidate for lumpectomy and would we would leave that open. We will see what the MRI showed to make sure she doesn't have multicentric disease. I told her we would decide whether she needs lumpectomy or mastectomy at a later date. She knows that she will need axillary lymph node surgery. I told her that she would likely need radiation therapy regardless of surgical technique.   Operative Findings:        The  catheter was inserted through the left subclavian vein without  difficulty.  Fluoroscopy in the operating room at the end of the procedure demonstrated that the catheter tip was in the superior vena cava near the right atrium and there is no deformity of the catheter anywhere.  The port flushed easily and had excellent blood return.  Procedure in Detail:          Following the induction of general LMA anesthesia the patient's was positioned with a roll behind her shoulders and her arms tucked at her sides.  The neck and chest were prepped and draped in a sterile fashion.  Surgical timeout was performed.  Intravenous antibiotics were given.  0.5% Marcaine with epinephrine was used as a local infiltration anesthetic.    A left subclavian venipuncture was performed with a single pass and the guidewire inserted into the central venous circulation into the vena cava under fluoroscopic guidance.  Using fluoroscopy I marked template on the chest wall to guide positioning of the catheter.  A small incision was made at the wire insertion site.  A 2-1/2 cm transverse incision was made about 3 or 4 cm below  the lateral left clavicle.  Some subcutaneous tissue was debrided.  A pocket was created.  Using a tunneling device I passed the port from the port insertion site to the wire insertion site.  Using the template marked on the chest wall I cut the catheter 22 cm in length.  The port was secured to the catheter with the locking device and the port and catheter flushed with heparinized saline.  The port was sutured to the pectoralis fascia with 3 interrupted sutures of 2-0 Prolene.  The dilator and peel-away sheath were inserted over the guidewire into the central venous circulation without difficulty.  The dilator and wire were removed, the catheter threaded through the peel-away sheath and the peel-away sheath removed.  We had excellent blood return and the catheter flushed easily.  Fluoroscopy looked very good as described above.  The port and catheter were flushed with concentrated heparin.  There was no bleeding.  The subcutaneous tissue was closed with 3-0 Vicryl sutures and skin closed with running subcuticular 4-0 Monocryl and Dermabond.  The patient was taken to PACU in stable condition where a chest x-ray will be obtained.  EBL 10 mL.  Counts correct.  Complications none.     Edsel Petrin. Dalbert Batman, M.D., FACS General and Minimally Invasive Surgery Breast and Colorectal Surgery  09/08/2015 2:42 PM

## 2015-09-08 NOTE — Anesthesia Preprocedure Evaluation (Addendum)
Anesthesia Evaluation  Patient identified by MRN, date of birth, ID band Patient awake    Reviewed: Allergy & Precautions, NPO status , Patient's Chart, lab work & pertinent test results  History of Anesthesia Complications (+) PONV and history of anesthetic complications  Airway Mallampati: II  TM Distance: >3 FB Neck ROM: Full    Dental  (+) Caps, Teeth Intact, Dental Advisory Given, Implants   Pulmonary asthma , former smoker,    breath sounds clear to auscultation       Cardiovascular hypertension, Pt. on medications  Rhythm:Regular     Neuro/Psych Depression  Neuromuscular disease    GI/Hepatic GERD  Controlled and Medicated,  Endo/Other  Hypothyroidism   Renal/GU      Musculoskeletal  (+) Fibromyalgia -  Abdominal   Peds  Hematology negative hematology ROS (+)   Anesthesia Other Findings Newly diagnosed breast cancer  Reproductive/Obstetrics                           Anesthesia Physical Anesthesia Plan  ASA: II  Anesthesia Plan: General   Post-op Pain Management:    Induction: Intravenous  Airway Management Planned: LMA  Additional Equipment: None  Intra-op Plan:   Post-operative Plan: Extubation in OR  Informed Consent: I have reviewed the patients History and Physical, chart, labs and discussed the procedure including the risks, benefits and alternatives for the proposed anesthesia with the patient or authorized representative who has indicated his/her understanding and acceptance.   Dental advisory given  Plan Discussed with: CRNA and Surgeon  Anesthesia Plan Comments:         Anesthesia Quick Evaluation

## 2015-09-08 NOTE — Anesthesia Postprocedure Evaluation (Signed)
Anesthesia Post Note  Patient: [name]  Procedure(s) Performed: Procedure(s) (LRB): INSERTION OF PORT-A-CATH  (Left)  Patient location during evaluation: PACU Anesthesia Type: General Level of consciousness: awake Pain management: pain level controlled Vital Signs Assessment: post-procedure vital signs reviewed and stable Respiratory status: spontaneous breathing Cardiovascular status: stable Postop Assessment: no signs of nausea or vomiting Anesthetic complications: no    Last Vitals:  Filed Vitals:   09/08/15 1545 09/08/15 1601  BP: 109/63 130/60  Pulse: 58 58  Temp:    Resp: 21     Last Pain:  Filed Vitals:   09/08/15 1603  PainSc: 0-No pain                 Shashank Kwasnik

## 2015-09-08 NOTE — Discharge Instructions (Signed)
    PORT-A-CATH: POST OP INSTRUCTIONS  Always review your discharge instruction sheet given to you by the facility where your surgery was performed.   1. A prescription for pain medication may be given to you upon discharge. Take your pain medication as prescribed, if needed. If narcotic pain medicine is not needed, then you make take acetaminophen (Tylenol) or ibuprofen (Advil) as needed.  2. Take your usually prescribed medications unless otherwise directed. 3. If you need a refill on your pain medication, please contact our office. All narcotic pain medicine now requires a paper prescription.  Phoned in and fax refills are no longer allowed by law.  Prescriptions will not be filled after 5 pm or on weekends.  4. You should follow a light diet for the remainder of the day after your procedure. 5. Most patients will experience some mild swelling and/or bruising in the area of the incision. It may take several days to resolve. 6. It is common to experience some constipation if taking pain medication after surgery. Increasing fluid intake and taking a stool softener (such as Colace) will usually help or prevent this problem from occurring. A mild laxative (Milk of Magnesia or Miralax) should be taken according to package directions if there are no bowel movements after 48 hours.  7. Unless discharge instructions indicate otherwise, you may remove your bandages 48 hours after surgery, and you may shower at that time. You may have steri-strips (small white skin tapes) in place directly over the incision.  These strips should be left on the skin for 7-10 days.  If your surgeon used Dermabond (skin glue) on the incision, you may shower in 24 hours.  The glue will flake off over the next 2-3 weeks.  8. If your port is left accessed at the end of surgery (needle left in port), the dressing cannot get wet and should only by changed by a healthcare professional. When the port is no longer accessed (when the  needle has been removed), follow step 7.   9. ACTIVITIES:  Limit activity involving your arms for the next 72 hours. Do no strenuous exercise or activity for 1 week. You may drive when you are no longer taking prescription pain medication, you can comfortably wear a seatbelt, and you can maneuver your car. 10.You may need to see your doctor in the office for a follow-up appointment.  Please       check with your doctor.  11.When you receive a new Port-a-Cath, you will get a product guide and        ID card.  Please keep them in case you need them.  WHEN TO CALL YOUR DOCTOR ([phone]): 1. Fever over 101.0 2. Chills 3. Continued bleeding from incision 4. Increased redness and tenderness at the site 5. Shortness of breath, difficulty breathing   The clinic staff is available to answer your questions during regular business hours. Please don't hesitate to call and ask to speak to one of the nurses or medical assistants for clinical concerns. If you have a medical emergency, go to the nearest emergency room or call 911.  A surgeon from [facility] is always on call at the hospital.     For further information, please visit [email or web address]      

## 2015-09-08 NOTE — Transfer of Care (Signed)
Immediate Anesthesia Transfer of Care Note  Patient: [name]  Procedure(s) Performed: Procedure(s): INSERTION OF PORT-A-CATH  (Left)  Patient Location: PACU  Anesthesia Type:General  Level of Consciousness: awake, alert  and oriented  Airway & Oxygen Therapy: Patient Spontanous Breathing  Post-op Assessment: Report given to RN, Post -op Vital signs reviewed and stable and Patient moving all extremities  Post vital signs: Reviewed and stable  Last Vitals:  Filed Vitals:   09/08/15 1212 09/08/15 1438  BP: 119/74 131/81  Pulse: 66 72  Temp: 37.2 C 36.5 C  Resp: 18 18    Last Pain:  Filed Vitals:   09/08/15 1442  PainSc: 5          Complications: No apparent anesthesia complications

## 2015-09-09 ENCOUNTER — Other Ambulatory Visit: Payer: Self-pay | Admitting: *Deleted

## 2015-09-09 ENCOUNTER — Encounter (HOSPITAL_COMMUNITY): Payer: Self-pay | Admitting: General Surgery

## 2015-09-09 ENCOUNTER — Encounter: Payer: Self-pay | Admitting: *Deleted

## 2015-09-09 ENCOUNTER — Ambulatory Visit (HOSPITAL_BASED_OUTPATIENT_CLINIC_OR_DEPARTMENT_OTHER): Payer: Medicare Other | Admitting: Oncology

## 2015-09-09 ENCOUNTER — Encounter: Payer: Self-pay | Admitting: Oncology

## 2015-09-09 ENCOUNTER — Telehealth: Payer: Self-pay | Admitting: Oncology

## 2015-09-09 VITALS — BP 132/66 | HR 61 | Temp 98.0°F | Resp 18 | Ht 63.0 in | Wt 178.5 lb

## 2015-09-09 DIAGNOSIS — Z170 Estrogen receptor positive status [ER+]: Secondary | ICD-10-CM

## 2015-09-09 DIAGNOSIS — C50411 Malignant neoplasm of upper-outer quadrant of right female breast: Secondary | ICD-10-CM

## 2015-09-09 DIAGNOSIS — M810 Age-related osteoporosis without current pathological fracture: Secondary | ICD-10-CM

## 2015-09-09 DIAGNOSIS — C773 Secondary and unspecified malignant neoplasm of axilla and upper limb lymph nodes: Secondary | ICD-10-CM

## 2015-09-09 NOTE — Progress Notes (Signed)
[name]  [phone]) [phone] Fax:(336) [phone]     ID: [name] DOB: March 14, 1946  MR#: [id number]  DHR#:[id number]  Patient Care Team: Brianna Carol, MD as PCP - General (Internal Medicine) Brianna Cruel, MD as Consulting Physician (Oncology) Brianna Skates, MD as Consulting Physician (General Surgery) Brianna Robertson, MD (Radiology) Brianna Duos, MD as Consulting Physician (Rheumatology) Brianna Pulse, MD (Urology) Brianna Brownie, MD as Consulting Physician (Dermatology) PCP: Brianna Hams, MD GYN: OTHER MD:  CHIEF COMPLAINT: Locally advanced breast cancer  CURRENT TREATMENT: Neoadjuvant chemotherapy   BREAST CANCER HISTORY: From the original intake note:  [name] had routine mammographic screening in 08/18/2015 at the [facility], showing a right breast mass and possible axillary adenopathy. On 08/23/2015 she underwent bilateral diagnostic mammography with tomography and bilateral breast ultrasonography. The breast density was category B in the right breast there was a spiculated mass in the upper-outer quadrant measuring 1.2 cm and associated with pleomorphic calcifications. There were calcifications extending anteriorly and posteriorly from the mass worrisome for ductal carcinoma in situ. The total area in question is about 3 cm. There were also multiple enlarged right axillary lymph nodes. The mass was palpated as an area of fullness at the 12:00 position of the breast 3 cm from the nipple. There were palpable enlarged right axillary lymph nodes in the right axilla. Ultrasonography of the right breast confirmed an irregular hypoechoic mass in the area in question measuring 1.6 cm. There were multiple abnormal appearing enlarged right axillary lymph nodes, the largest measuring 2.3 cm.  In the left breast, mammography showed an area of architectural distortion associated with the left nipple. This was not palpable. Ultrasonography of the left  breast found no correlate. This area of the left breast was biopsied under tomography 08/24/2015. This showed (SAA [id number]) a complex sclerosing lesion and fibroadenoma.  On 08/23/2015 the patient underwent biopsy of the right breast mass in 1 of the abnormal right axillary lymph nodes. 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 Both biopsies were HER-2 nonamplified, the breast being ratio 1.27 and number per cell 1.90, and the lymph node 1 ratio 1.24 and number per cell 3.0.  On 08/26/2015 the patient underwent bilateral breast MRI. In the left breast there was no suspicious enhancement. The left axilla was benign. In the right breast there was a mass described as in the upper inner quadrant measuring 2.3 cm. Less than a centimeter away from this mass there was a satellite nodule measuring 0.9 cm. In the right axilla there were multiple enlarged right axillary lymph nodes, including a confluent region measuring up to 4 cm. There were also enlarged level II lymph nodes present measuring up to 2.1 cm.  The patient's subsequent history is as detailed below  INTERVAL HISTORY: [name] returns today accompanied by her husband [name] to discuss her staging studies and medications for her upcoming chemotherapy. ince her last visit here she had a bone scanshowed only some osteoarthritis, and a CT of the chest, which showed axillary adenopathy but no evidence of stage IV disease She then had her port placed, with some pain as the only complication. She is scheduled to start herchemotherapy 09/12/2015   REVIEW OF SYSTEMS: Aside from the discomfort relating to the port placement, which was not accompanied by any bleeding, swelling, erythema, or fever, A detailed review of  systems today is entirely stable  PAST MEDICAL HISTORY: Past Medical History    Diagnosis Date  . Osteoporosis   . Menopausal symptoms   . Chronic vaginitis   . Fibromyalgia   . Interstitial cystitis   . Thyroid disease     hypothyroid  . Hypoglycemia   . Asthma   . PONV (postoperative nausea and vomiting)   . Cancer [facility])     breast cancer  . Pneumonia   . Depression     PAST SURGICAL HISTORY: Past Surgical History  Procedure Laterality Date  . Nasal sinus surgery      X4  . Bladder hyperextension      X4  . Bunionectomy    . Leg surgery  06/2004    Broken tibia  . Cholecystectomy  01/2006  . Cesarean section      DR. INGRAM  . Mouth surgery    . Dental surgery      13 dental implants  . Colonoscopy      FAMILY HISTORY Family History  Problem Relation Age of Onset  . Hypertension Mother   . Heart disease Mother   . Parkinsonism Father   . Heart disease Sister   . Cancer Sister     MELANOMA  . Breast cancer Sister     Age 61  The patient's father died at age 71 from complications of Parkinson's disease. The patient's mother died age 61 with heart disease. [name] had no brothers. She had 2 sisters. One sister was diagnosed with breast cancer at age 85. She was tested for the BRCA gene and was negative. The same sister also had melanoma diagnosed in her 80s. The patient has one maternal great aunt diagnosed with breast cancer around age 62. There is no history of ovarian cancer in the family  GYNECOLOGIC HISTORY:  No LMP recorded. Patient is postmenopausal. Menarche age 17, first live birth age 31. The patient is GX P2. She stopped having periods in her early 44s and took hormone replacement until her breast cancer diagnosis, April 2017.  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 He has been in remission for more than 10 years. Their Brianna  [name] lives in [place] were she is Production designer, theatre/television/film of the [employer]. 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 The patient has one granddaughter, 47 months old as of May 2017. The family attends [employer]: Not in place   HEALTH MAINTENANCE: Social History  Substance Use Topics  . Smoking status: Former Smoker    Types: Cigarettes  . Smokeless tobacco: Never Used  . Alcohol Use: 0.0 oz/week    0 Standard drinks or equivalent per week     Comment: social     Colonoscopy: 2016/ [facility]  PAP: 2015  Bone density:   Lipid panel:  Allergies  Allergen Reactions  . Fluconazole     REACTION: elavated LFTs  . Nitrofurantoin Other (See Comments)    Unknown reaction  . Penicillins Other (See Comments)    Has patient had a PCN reaction causing immediate rash, facial/tongue/throat swelling, SOB or lightheadedness with hypotension: No Has patient had a PCN reaction causing severe rash involving mucus membranes or skin necrosis: No Has patient had a PCN reaction  that required hospitalization No Has patient had a PCN reaction occurring within the last 10 years: No If all of the above answers are "NO", then may proceed with Cephalosporin use.  . Other Rash    Adhesive tape    Current Outpatient Prescriptions  Medication Sig Dispense Refill  . buPROPion (WELLBUTRIN XL) 300 MG 24 hr tablet Take 300 mg by mouth daily.      . calcitonin, salmon, (MIACALCIN/FORTICAL) 200 UNIT/ACT nasal spray Place 1 spray into alternate nostrils daily. 3.7 mL 11  . Calcium Carbonate-Vitamin D (CALCIUM + D PO) Take 1 tablet by mouth daily.     . clotrimazole-betamethasone (LOTRISONE) cream Apply topically 2 (two) times daily. (Patient taking differently: Apply 1 application topically as needed. Apply to affected area as needed) 45 g 3  . dexamethasone (DECADRON) 4 MG tablet Take 2 tablets (8 mg total) by mouth 2 (two) times  daily. Start the day before Taxotere. Then again the day after chemo for 3 days. 30 tablet 1  . diazepam (VALIUM) 5 MG tablet Take 5 mg by mouth every 6 (six) hours as needed for anxiety.     . hydrOXYzine (ATARAX/VISTARIL) 25 MG tablet Take 25 mg by mouth 3 (three) times daily as needed for anxiety or itching.    . Levothyroxine Sodium (SYNTHROID PO) Take 137 mcg by mouth daily.     Marland Kitchen lidocaine-prilocaine (EMLA) cream Apply to affected area once (Patient taking differently: Apply 1 application topically as needed. Apply to affected area once) 30 g 3  . LORazepam (ATIVAN) 0.5 MG tablet Take 1 tablet (0.5 mg total) by mouth at bedtime as needed (Nausea or vomiting). 30 tablet 0  . Multiple Vitamin (MULTIVITAMIN) capsule Take 1 capsule by mouth daily.      . nabumetone (RELAFEN) 750 MG tablet Take 750 mg by mouth 2 (two) times daily as needed for mild pain.    Marland Kitchen ondansetron (ZOFRAN) 8 MG tablet Take 1 tablet (8 mg total) by mouth 2 (two) times daily as needed for refractory nausea / vomiting. Start on day 3 after chemo. 30 tablet 1  . oxyCODONE-acetaminophen (PERCOCET) 7.5-325 MG tablet Take 1 tablet by mouth every 4 (four) hours as needed for severe pain. 30 tablet 0  . pantoprazole (PROTONIX) 40 MG tablet Take 40 mg by mouth daily.    . prochlorperazine (COMPAZINE) 10 MG tablet Take 1 tablet (10 mg total) by mouth every 6 (six) hours as needed (Nausea or vomiting). 30 tablet 1  . sulfamethoxazole-trimethoprim (BACTRIM DS,SEPTRA DS) 800-160 MG per tablet Take 1 tablet by mouth 2 (two) times daily. (Patient taking differently: Take 1 tablet by mouth as needed. ) 14 tablet 1  . terconazole (TERAZOL 7) 0.4 % vaginal cream Place 1 applicator vaginally at bedtime. (Patient taking differently: Place 1 applicator vaginally at bedtime as needed. ) 45 g 1  . tobramycin (TOBREX) 0.3 % ophthalmic solution Place 1-2 drops into the right eye 4 (four) times daily. X 5 days (Patient taking differently: Place 1-2 drops  into the right eye 4 (four) times daily as needed. ) 5 mL 0  . Triamterene-HCTZ (DYAZIDE PO) Take 1 tablet by mouth daily.     Marland Kitchen triamterene-hydrochlorothiazide (MAXZIDE) 75-50 MG tablet Take 1 tablet by mouth daily.    . valACYclovir (VALTREX) 1000 MG tablet Take 1 tablet (1,000 mg total) by mouth daily. (Patient taking differently: Take 1,000 mg by mouth daily as needed. ) 30 tablet 1  . Vitamin D, Ergocalciferol, (DRISDOL)  50000 UNITS CAPS capsule One tablet every other week (Patient taking differently: Take 50,000 Units by mouth every 14 (fourteen) days. One tablet every other week) 10 capsule 3  . zolpidem (AMBIEN) 10 MG tablet Take 10 mg by mouth at bedtime as needed for sleep.      No current facility-administered medications for this visit.    OBJECTIVE: Middle-aged white woman who appears stated age 70 Vitals:   09/09/15 0919  BP: 132/66  [name]: 60  Temp: 98 F (36.7 C)  Resp: 18     Body mass index is 31.63 kg/(m^2).    ECOG FS:1 - Symptomatic but completely ambulatory  Ocular: Sclerae unicteric, pupils equal, round and reactive to light Ear-nose-throat: Oropharynx clear and moist Lymphatic: No cervical or supraclavicular adenopathy Lungs no rales or rhonchi, good excursion bilaterally Heart regular rate and rhythm, no murmur appreciated Abd soft, nontender, positive bowel sounds MSK no focal spinal tenderness, no joint edema Neuro: non-focal, well-oriented, appropriate affect Breasts: Both breasts are status post recent biopsy. There are ecchymoses bilaterally. The breasts are relatively lumpy. There is palpable adenopathy in the right axilla. The left axilla is benign.   LAB RESULTS:  CMP     Component Value Date/Time   NA 138 08/31/2015 1612   K 4.4 08/31/2015 1612   CO2 26 08/31/2015 1612   GLUCOSE 102 08/31/2015 1612   BUN 12.3 08/31/2015 1612   CREATININE 1.0 08/31/2015 1612   CALCIUM 9.6 08/31/2015 1612   PROT 7.8 08/31/2015 1612   ALBUMIN 3.9  08/31/2015 1612   AST 20 08/31/2015 1612   ALT 21 08/31/2015 1612   ALKPHOS 83 08/31/2015 1612   BILITOT 0.46 08/31/2015 1612    INo results found for: SPEP, UPEP  Lab Results  Component Value Date   WBC 11.7* 08/31/2015   NEUTROABS 8.4* 08/31/2015   HGB 15.3 08/31/2015   HCT 46.1 08/31/2015   MCV 92.5 08/31/2015   PLT 344 08/31/2015      Chemistry      Component Value Date/Time   NA 138 08/31/2015 1612   K 4.4 08/31/2015 1612   CO2 26 08/31/2015 1612   BUN 12.3 08/31/2015 1612   CREATININE 1.0 08/31/2015 1612      Component Value Date/Time   CALCIUM 9.6 08/31/2015 1612   ALKPHOS 83 08/31/2015 1612   AST 20 08/31/2015 1612   ALT 21 08/31/2015 1612   BILITOT 0.46 08/31/2015 1612       No results found for: LABCA2  No components found for: LABCA125  No results for input(s): INR in the last 168 hours.  Urinalysis No results found for: COLORURINE, APPEARANCEUR, LABSPEC, PHURINE, GLUCOSEU, HGBUR, BILIRUBINUR, KETONESUR, PROTEINUR, UROBILINOGEN, NITRITE, LEUKOCYTESUR    ELIGIBLE FOR AVAILABLE RESEARCH PROTOCOL: no  STUDIES: Ct Chest W Contrast  09/06/2015  CLINICAL DATA:  New diagnosis RIGHT breast cancer. Initial evaluation EXAM: CT CHEST WITH CONTRAST TECHNIQUE: Multidetector CT imaging of the chest was performed during intravenous contrast administration. CONTRAST:  75 mL Isovue COMPARISON:  None. FINDINGS: Mediastinum/Nodes: Multiple enlarged RIGHT axillary lymph nodes. Example lymph node measures 17 mm short axis on image 23, series 2. Lymph node beneath the RIGHT sub pectoralis muscle measures 13 mm short axis on 20, series 2. No supraclavicular or infraclavicular adenopathy present. No internal mammary adenopathy.  No mediastinal adenopathy. No central pulmonary embolism. Coronary calcifications. Esophagus normal. Lungs/Pleura: Multiple scattered noncalcified pulmonary nodules which are less than 5 mm throughout the lungs. For example 3 mm RIGHT upper lobe  nodule  on image 30, series 5. 3 mm nodule on image 27, series 5. 4 mm LEFT upper lobe nodule image 30, series 5. These nodules have an upper lobe predominance. Several ringed nodules present in the LEFT lower lobe (image 121, series 5 example measuring 4 mm). Upper abdomen: Postcholecystectomy. Adrenal glands are normal. Upper poles of the kidneys are normal. No upper abdominal adenopathy. Musculoskeletal: No aggressive osseous lesion. IMPRESSION: 1. Metastatic adenopathy to the RIGHT axilla and sub pectoralis nodal stations. 2. No central metastatic adenopathy. 3. Multiple sub 5 mm noncalcified nodules predominantly within the upper lobes but also within the lower lobes. Common differential includes noncalcified granulomatous disease versus metastatic disease. The uniformity of the nodular size favors a non metastatic process. These nodules are too small to be evaluated by FDG PET scan. Electronically Signed   By: Suzy Bouchard M.D.   On: 09/06/2015 09:00   Nm Bone Scan Whole Body  09/06/2015  CLINICAL DATA:  Breast cancer staging ; the patient reports lumbar back pain and history of a tibial fracture, no recent trauma. EXAM: NUCLEAR MEDICINE WHOLE BODY BONE SCAN TECHNIQUE: Whole body anterior and posterior images were obtained approximately 3 hours after intravenous injection of radiopharmaceutical. RADIOPHARMACEUTICALS:  26.0 mCi Technetium-46mMDP IV COMPARISON:  None in PACs FINDINGS: There is adequate uptake of the radiopharmaceutical by the skeleton. There is adequate soft tissue clearance and renal activity. Uptake within the calvarium is normal. Mildly increased uptake posteriorly in the mid cervical spine is compatible with degenerative change. Minimal increased uptake at approximately T3 and T10 posteriorly is consistent with degenerative change. Mildly increased uptake at approximately L4 is consistent with degenerative change. Uptake within the ribs is normal. There is focally increased uptake in the  first carpometacarpal joint on the left. Uptake within the pelvis and thighs and legs is normal. Mildly increased uptake in the midfoot on the right is present. IMPRESSION: No uptake demonstrated to suggest metastatic disease. Areas of increased uptake described above are most compatible with degenerative change. Electronically Signed   By: David  JMartiniqueM.D.   On: 09/06/2015 11:20   Mr Breast Bilateral W Wo Contrast  08/29/2015  ADDENDUM REPORT: 08/29/2015 15:18 ADDENDUM: Corrected report: Lymph nodes: Multiple enlarged right axillary lymph nodes are identified. The largest mass measured is likely a confluence of multiple nodes and measures approximately 4.0 cm. Per discussion with Dr. IDalbert Batman patient will be scheduled for MR guided biopsy and clip placement of the satellite nodule within the right breast. Electronically Signed   By: ENolon NationsM.D.   On: 08/29/2015 15:18  08/29/2015  CLINICAL DATA:  Recent diagnosis of invasive and in situ ductal carcinoma following a biopsy of the right breast 12 o'clock location. Biopsy of enlarged right axillary lymph node demonstrated metastatic carcinoma. Stereotactic guided core biopsy of distortion in the 1 o'clock location shows a complex sclerosing lesion without atypia. LABS:  [facility] [address]Results: Creatinine 1.0 mg/dL. EXAM: BILATERAL BREAST MRI WITH AND WITHOUT CONTRAST TECHNIQUE: Multiplanar, multisequence MR images of both breasts were obtained prior to and following the intravenous administration of 15 ml of MultiHance. THREE-DIMENSIONAL MR IMAGE RENDERING ON INDEPENDENT WORKSTATION: Three-dimensional MR images were rendered by post-processing of the original MR data on an independent workstation. The three-dimensional MR images were interpreted, and findings are reported in the following complete MRI report for this study. Three dimensional images were evaluated at the independent DynaCad  workstation COMPARISON:  Recent mammogram and ultrasound exams from the [facility] dated 08/24/2015 and earlier FINDINGS: Breast composition: b. Scattered fibroglandular tissue. Background parenchymal enhancement: Minimal Right breast: There is an irregular mass within the upper inner quadrant of the right breast measuring 2.3 x 2.3 x 1.2 cm appear within the mass there is tissue marker clip from recent ultrasound-guided core biopsy which demonstrated invasive ductal carcinoma and ductal carcinoma in situ. 5 mm medial and inferior to this mass, there is a small irregular enhancing mass which measures 7 x 9 mm. Given the irregularity and appearance, this is favored to represent a small satellite nodule. Enhancement within both areas is rapid wash-in and washout type kinetics. No other suspicious abnormalities are identified within the right breast. Left breast: There is tissue marker clip artifact within the upper-outer quadrant of the left breast following a recent stereotactic guided core biopsy which demonstrated complex sclerosing lesion. No suspicious enhancement is identified in the left breast. Excision has been recommended of this lesion. Lymph nodes: Multiple enlarged right axillary lymph nodes are identified. The largest is likely ache and fluid region of multiple nodes measuring approximately 4.0 cm. Enlarged level 2 lymph nodes are present, measuring up to 2.1 cm. Left axilla and internal mammary chain are negative for adenopathy. Ancillary findings:  None. IMPRESSION: 1. Known malignancy measuring 2.3 x 2.3 cm in the upper inner quadrant of the right breast. 2. Small adjacent satellite nodule along the lower medial aspect of the known malignancy measuring 7 x 9 mm. 3. Multiple large right axillary lymph nodes including level 2 nodes. Findings are consistent with known metastatic disease. 4. Expected post biopsy changes in the left breast without significant enhancement following  biopsy demonstrating complex sclerosing lesion. RECOMMENDATION: Treatment plan for known right breast cancer. BI-RADS CATEGORY  6: Known biopsy-proven malignancy. Electronically Signed: By: Nolon Nations M.D. On: 08/29/2015 11:44   Dg Chest Port 1 View  09/08/2015  CLINICAL DATA:  Port-A-Cath placement EXAM: PORTABLE CHEST 1 VIEW COMPARISON:  07/31/2011 FINDINGS: Borderline cardiomegaly. Mild hyperinflation again noted. No acute infiltrate or pulmonary edema. There is left subclavian Port-A-Cath with tip in distal SVC. No pneumothorax. IMPRESSION: Mild hyperinflation. No active disease. Left subclavian Port-A-Cath in place. No pneumothorax. Electronically Signed   By: Lahoma Crocker M.D.   On: 09/08/2015 16:00   Mm Digital Diagnostic Unilat R  08/23/2015  CLINICAL DATA:  Post right breast ultrasound-guided biopsy. EXAM: DIAGNOSTIC RIGHT MAMMOGRAM POST ULTRASOUND BIOPSY COMPARISON:  Previous exam(s). FINDINGS: Mammographic images were obtained following ultrasound guided biopsy of the spiculated mass located within the right breast at 12 o'clock position an enlarged right axillary lymph node. The ribbon shaped clip is in appropriate position associated with spiculated mass located within the right breast at 12 o'clock position. Spiral shaped HydroMARK clip is in appropriate position associated with an enlarged right axillary lymph node as seen in the right MLO projection. IMPRESSION: Appropriate positioning of clips following right breast and right axillary lymph node ultrasound-guided core biopsies. Final Assessment: Post Procedure Mammograms for Marker Placement Electronically Signed   By: Altamese Cabal M.D.   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 Patient returns for results following left breast tomosynthesis  guided biopsy. Current Pain Level: 3 HISTORY OF PRESENT ILLNESS: Diagnostic evaluation demonstrated a spiculated mass with calcifications located  superiorly within the right breast with abnormal appearing right axillary lymph nodes. Right breast and axillary lymph node ultrasound-guided core biopsies were performed on 08/23/2015. An additional area of subtle distortion was noted within the left breast and tomosynthesis guided biopsy of the left breast was performed on 08/24/2015. The patient returns for results. EXAM: There is moderate ecchymosis present bilaterally related to the patient's bilateral ultrasound-guided biopsies. There is no hematoma formation or signs of infection. PATHOLOGY: The pathology associated with the left breast tomosynthesis guided biopsy of the subtle area of distortion located within the anterior left breast at the 1 o'clock position demonstrated a complex sclerosing lesion. Pathology is concordant with the imaging findings. ASSESSMENT AND PLAN: ASSESSMENT AND PLAN 1. Complex sclerosing lesion associated with the subtle area of distortion located within the left breast 1 o'clock position. Excision is recommended. 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 The patient is scheduled to see Dr.Magrinat on 08/31/2015. The patient has been rescheduled to see Dr. Dalbert Batman for surgical consultation on 08/26/2015. Post biopsy wound care instructions were reviewed with the patient. The patient was encouraged to call our breast center for additional questions or concerns. Electronically Signed   By: Altamese Cabal M.D.   On: 08/25/2015 14:43   Mm Radiologist Eval And Mgmt  08/24/2015  EXAM: ESTABLISHED PATIENT OFFICE VISIT - LEVEL II CHIEF COMPLAINT: Patient returns for results following right breast and axillary lymph node ultrasound-guided core biopsies. Current Pain Level: 3 HISTORY OF PRESENT ILLNESS: The patient  noted a of palpable mass within the right breast. Evaluation demonstrated a mass located within the right breast superiorly and right axillary adenopathy. Also, there is possible distortion located within the left breast 1 o'clock position. Patient underwent right breast and right axillary lymph node ultrasound-guided core biopsies on 08/23/2015 and returns for results. EXAM: There is mild ecchymosis associated with the right breast and right axillary lymph node ultrasound-guided core biopsy sites. There is no hematoma formation and no signs of infection. PATHOLOGY: The pathology demonstrated invasive ductal carcinoma with DCIS and a metastatic right axillary lymph node. The pathology is concordant with the imaging findings. I have discussed the findings with the patient and her husband and answered their questions. 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 Plan is to perform left breast tomosynthesis guided biopsy 08/24/2015 of an area of possible subtle distortion located within the left breast at the 1 o'clock position. Patient is scheduled for Medical Oncology consultation with Dr. Jana Hakim on 08/31/2015. The patient is scheduled see Dr. Fanny [name] for surgical consultation on 09/08/2015. Post biopsy wound care instructions were reviewed with the patient. Electronically Signed   By: Altamese Cabal M.D.   On: 08/24/2015 14:14   Dg Fluoro Guide Cv Line-no Report  09/08/2015  CLINICAL DATA:  FLOURO GUIDE CV LINE Fluoroscopy was utilized by the requesting physician.  No radiographic interpretation.   US Breast Ltd Uni Left Inc Axilla  08/23/2015  CLINICAL DATA:  Recall from screening mammography. EXAM: 2D DIGITAL DIAGNOSTIC BILATERAL MAMMOGRAM WITH CAD AND ADJUNCT TOMO ULTRASOUND BILATERAL BREAST COMPARISON:  08/11/2015, 01/15/2012, 01/09/2011. ACR Breast Density Category b: There are scattered areas of  fibroglandular density. FINDINGS: Additional views of the right breast demonstrate a spiculated mass to be present located within the right breast at the 11:30 to 12 o'clock position with associated pleomorphic calcifications within the mass.  The mass measures 1.2 cm by mammography. In addition, there are pleomorphic calcifications extending anteriorly and posteriorly from the mass which are worrisome for possible DCIS. The mass and calcifications span approximately 3 cm. In addition, there are multiple enlarged right axillary lymph nodes present. Additional tomosynthesis views of the left breast demonstrate subtle possible architectural distortion located slightly lateral and superior to the left nipple within the anterior 1/3 of the breast. There is a small adjacent oval circumscribed nodule present which has been stable when compared with mammograms dating back to 2007. Mammographic images were processed with CAD. On physical exam, there is a fullness present within the right breast at 12 o'clock position 3 cm from the nipple. There are palpable enlarged right axillary lymph nodes present within the low right axilla. There is no palpable abnormality within the left breast. Targeted ultrasound is performed, showing an irregularly marginated hypoechoic mass with shadowing located within the right breast at the 12 o'clock position 3 cm from the nipple measuring 1.6 x 1.2 x 1.2 cm in size. This is worrisome for invasive mammary carcinoma. In addition, there are multiple abnormal appearing enlarged right axillary lymph nodes present with loss of normal hilar fat and increased vascularity. The largest lymph node measures 2.3 x 2.3 x 1.3 cm in size. Ultrasound-guided core biopsy of the right breast mass and 1 of the enlarged right axillary lymph node is recommended. This will be performed today and reported separately. Ultrasound of the left breast in the area of questioned distortion noted on the recent mammogram  demonstrates no definite ultrasound correlate. As a result, tomosynthesis guided biopsy of the possible subtle distortion within the left breast is recommended and will be scheduled. IMPRESSION: 1.Spiculated mass with associated worrisome calcifications located within the right breast the 12 o'clock position worrisome for invasive mammary carcinoma and adjacent DCIS. This is associated with multiple enlarged right axillary lymph nodes. 2. Question subtle persistent distortion located slightly lateral and superior to the left nipple within the anterior 1/3 of the left breast. Tomosynthesis guided biopsy is recommended and will be scheduled. RECOMMENDATION: 1. Right breast ultrasound-guided core biopsy at the 12 o'clock position. 2. Right axillary lymph node ultrasound-guided core biopsy. 3. Left breast tomosynthesis guided biopsy of questionable subtle distortion. I have discussed the findings and recommendations with the patient. Results were also provided in writing at the conclusion of the visit. If applicable, a reminder letter will be sent to the patient regarding the next appointment. BI-RADS CATEGORY  5: Highly suggestive of malignancy. Electronically Signed   By: Altamese Cabal M.D.   On: 08/23/2015 17:08   US Breast Ltd Uni Right Inc Axilla  08/23/2015  CLINICAL DATA:  Recall from screening mammography. EXAM: 2D DIGITAL DIAGNOSTIC BILATERAL MAMMOGRAM WITH CAD AND ADJUNCT TOMO ULTRASOUND BILATERAL BREAST COMPARISON:  08/11/2015, 01/15/2012, 01/09/2011. ACR Breast Density Category b: There are scattered areas of fibroglandular density. FINDINGS: Additional views of the right breast demonstrate a spiculated mass to be present located within the right breast at the 11:30 to 12 o'clock position with associated pleomorphic calcifications within the mass. The mass measures 1.2 cm by mammography. In addition, there are pleomorphic calcifications extending anteriorly and posteriorly from the mass which are  worrisome for possible DCIS. The mass and calcifications span approximately 3 cm. In addition, there are multiple enlarged right axillary lymph nodes present. Additional tomosynthesis views of the left breast demonstrate subtle possible architectural distortion located slightly lateral and superior to the left nipple within the anterior 1/3  of the breast. There is a small adjacent oval circumscribed nodule present which has been stable when compared with mammograms dating back to 2007. Mammographic images were processed with CAD. On physical exam, there is a fullness present within the right breast at 12 o'clock position 3 cm from the nipple. There are palpable enlarged right axillary lymph nodes present within the low right axilla. There is no palpable abnormality within the left breast. Targeted ultrasound is performed, showing an irregularly marginated hypoechoic mass with shadowing located within the right breast at the 12 o'clock position 3 cm from the nipple measuring 1.6 x 1.2 x 1.2 cm in size. This is worrisome for invasive mammary carcinoma. In addition, there are multiple abnormal appearing enlarged right axillary lymph nodes present with loss of normal hilar fat and increased vascularity. The largest lymph node measures 2.3 x 2.3 x 1.3 cm in size. Ultrasound-guided core biopsy of the right breast mass and 1 of the enlarged right axillary lymph node is recommended. This will be performed today and reported separately. Ultrasound of the left breast in the area of questioned distortion noted on the recent mammogram demonstrates no definite ultrasound correlate. As a result, tomosynthesis guided biopsy of the possible subtle distortion within the left breast is recommended and will be scheduled. IMPRESSION: 1.Spiculated mass with associated worrisome calcifications located within the right breast the 12 o'clock position worrisome for invasive mammary carcinoma and adjacent DCIS. This is associated with  multiple enlarged right axillary lymph nodes. 2. Question subtle persistent distortion located slightly lateral and superior to the left nipple within the anterior 1/3 of the left breast. Tomosynthesis guided biopsy is recommended and will be scheduled. RECOMMENDATION: 1. Right breast ultrasound-guided core biopsy at the 12 o'clock position. 2. Right axillary lymph node ultrasound-guided core biopsy. 3. Left breast tomosynthesis guided biopsy of questionable subtle distortion. I have discussed the findings and recommendations with the patient. Results were also provided in writing at the conclusion of the visit. If applicable, a reminder letter will be sent to the patient regarding the next appointment. BI-RADS CATEGORY  5: Highly suggestive of malignancy. Electronically Signed   By: Altamese Cabal M.D.   On: 08/23/2015 17:08   Mm Diag Breast Tomo Uni Left  08/24/2015  CLINICAL DATA:  Post left breast tomosynthesis guided biopsy. EXAM: DIAGNOSTIC LEFT MAMMOGRAM POST TOMOSYNTHESIS GUIDED BIOPSY COMPARISON:  Previous exam(s). FINDINGS: Mammographic images were obtained following tomosynthesis guided biopsy of a subtle area of possible distortion located within the left breast at the 1 o'clock position. The X shaped clip appears in appropriate position. IMPRESSION: Appropriate position of clip following left breast tomosynthesis guided biopsy. Final Assessment: Post Procedure Mammograms for Marker Placement Electronically Signed   By: Altamese Cabal M.D.   On: 08/24/2015 14:09   Mm Diag Breast Tomo Bilateral  08/23/2015  CLINICAL DATA:  Recall from screening mammography. EXAM: 2D DIGITAL DIAGNOSTIC BILATERAL MAMMOGRAM WITH CAD AND ADJUNCT TOMO ULTRASOUND BILATERAL BREAST COMPARISON:  08/11/2015, 01/15/2012, 01/09/2011. ACR Breast Density Category b: There are scattered areas of fibroglandular density. FINDINGS: Additional views of the right breast demonstrate a spiculated mass to be present located within  the right breast at the 11:30 to 12 o'clock position with associated pleomorphic calcifications within the mass. The mass measures 1.2 cm by mammography. In addition, there are pleomorphic calcifications extending anteriorly and posteriorly from the mass which are worrisome for possible DCIS. The mass and calcifications span approximately 3 cm. In addition, there are multiple enlarged right axillary lymph  nodes present. Additional tomosynthesis views of the left breast demonstrate subtle possible architectural distortion located slightly lateral and superior to the left nipple within the anterior 1/3 of the breast. There is a small adjacent oval circumscribed nodule present which has been stable when compared with mammograms dating back to 2007. Mammographic images were processed with CAD. On physical exam, there is a fullness present within the right breast at 12 o'clock position 3 cm from the nipple. There are palpable enlarged right axillary lymph nodes present within the low right axilla. There is no palpable abnormality within the left breast. Targeted ultrasound is performed, showing an irregularly marginated hypoechoic mass with shadowing located within the right breast at the 12 o'clock position 3 cm from the nipple measuring 1.6 x 1.2 x 1.2 cm in size. This is worrisome for invasive mammary carcinoma. In addition, there are multiple abnormal appearing enlarged right axillary lymph nodes present with loss of normal hilar fat and increased vascularity. The largest lymph node measures 2.3 x 2.3 x 1.3 cm in size. Ultrasound-guided core biopsy of the right breast mass and 1 of the enlarged right axillary lymph node is recommended. This will be performed today and reported separately. Ultrasound of the left breast in the area of questioned distortion noted on the recent mammogram demonstrates no definite ultrasound correlate. As a result, tomosynthesis guided biopsy of the possible subtle distortion within the  left breast is recommended and will be scheduled. IMPRESSION: 1.Spiculated mass with associated worrisome calcifications located within the right breast the 12 o'clock position worrisome for invasive mammary carcinoma and adjacent DCIS. This is associated with multiple enlarged right axillary lymph nodes. 2. Question subtle persistent distortion located slightly lateral and superior to the left nipple within the anterior 1/3 of the left breast. Tomosynthesis guided biopsy is recommended and will be scheduled. RECOMMENDATION: 1. Right breast ultrasound-guided core biopsy at the 12 o'clock position. 2. Right axillary lymph node ultrasound-guided core biopsy. 3. Left breast tomosynthesis guided biopsy of questionable subtle distortion. I have discussed the findings and recommendations with the patient. Results were also provided in writing at the conclusion of the visit. If applicable, a reminder letter will be sent to the patient regarding the next appointment. BI-RADS CATEGORY  5: Highly suggestive of malignancy. Electronically Signed   By: Altamese Cabal M.D.   On: 08/23/2015 17:08   Mm Screening Breast Tomo Bilateral  08/18/2015  CLINICAL DATA:  Screening. EXAM: 2D DIGITAL SCREENING BILATERAL MAMMOGRAM WITH CAD AND ADJUNCT TOMO COMPARISON:  Previous exam(s). ACR Breast Density Category b: There are scattered areas of fibroglandular density. FINDINGS: In the right breast a breast mass and possible adenopathy require further evaluation. In the left breast possible distortion requires further evaluation. Images were processed with CAD. IMPRESSION: Further evaluation is suggested for possible right breast mass and possible right axillary adenopathy. Further evaluation is suggested for possible distortion in the left breast. RECOMMENDATION: Diagnostic mammogram and ultrasound of both breasts. (Code:[id number]) The patient will be contacted regarding the findings, and additional imaging will be scheduled. BI-RADS  CATEGORY  0: Incomplete. Need additional imaging evaluation and/or prior mammograms for comparison. Electronically Signed   By: Nolon Nations M.D.   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 Area of subtle possible distortion located within the left breast anteriorly at the  1 o'clock position. Biopsy requested. EXAM: LEFT BREAST TOMOSYNTHESIS GUIDED CORE NEEDLE BIOPSY COMPARISON:  Previous exams. FINDINGS: The patient and I discussed the procedure of stereotactic-guided biopsy including benefits and alternatives. We discussed the high likelihood of a successful procedure. We discussed the risks of the procedure including infection, bleeding, tissue injury, clip migration, and inadequate sampling. Informed written consent was given. The usual time out protocol was performed immediately prior to the procedure. Using sterile technique and 1% Lidocaine as local anesthetic, under stereotactic guidance, a 9 gauge vacuum assisted device was used to perform core needle biopsy of the area of subtle distortion located within the anterior portion of the left breast at the 1 o'clock position using a superior craniocaudal approach. No specimen mammogram was obtained. At the conclusion of the procedure, an X shaped tissue marker clip was deployed into the biopsy cavity. Follow-up 2-view mammogram was performed and dictated separately. IMPRESSION: Tomosynthesis guided biopsy of subtle possible left breast distortion. No apparent complications. Electronically Signed   By: Altamese Cabal M.D.   On: 08/24/2015 14:07   [name] Rt Breast Bx W Loc Dev 1st Lesion Img Bx Spec US Guide  08/23/2015  CLINICAL DATA:  Right breast mass and enlarged right axillary lymph nodes. EXAM: ULTRASOUND GUIDED RIGHT BREAST CORE NEEDLE BIOPSY COMPARISON:  Previous  exam(s). FINDINGS: I met with the patient and we discussed the procedure of ultrasound-guided biopsy, including benefits and alternatives. We discussed the high likelihood of a successful procedure. We discussed the risks of the procedure, including infection, bleeding, tissue injury, clip migration, and inadequate sampling. Informed written consent was given. The usual time-out protocol was performed immediately prior to the procedure. Using sterile technique and 1% Lidocaine as local anesthetic, under direct ultrasound visualization, a 14 gauge spring-loaded device was used to perform biopsy of the spiculated mass located within the right breast at the 12 o'clock position using a medial approach. At the conclusion of the procedure a ribbon shaped tissue marker clip was deployed into the biopsy cavity. Follow up 2 view mammogram was performed and dictated separately. IMPRESSION: Ultrasound guided biopsy of the right breast mass located the 12 o'clock position as discussed above. No apparent complications. Electronically Signed   By: Altamese Cabal M.D.   On: 08/23/2015 17:15   [name] Rt Breast Bx W Loc Dev Ea Add Lesion Img Bx Spec US Guide  08/23/2015  CLINICAL DATA:  Right breast mass enlarged right axillary lymph nodes. EXAM: ULTRASOUND GUIDED RIGHT AXILLARY LYMPH NODE CORE NEEDLE BIOPSY COMPARISON:  Previous exam(s). FINDINGS: I met with the patient and we discussed the procedure of ultrasound-guided biopsy, including benefits and alternatives. We discussed the high likelihood of a successful procedure. We discussed the risks of the procedure, including infection, bleeding, tissue injury, clip migration, and inadequate sampling. Informed written consent was given. The usual time-out protocol was performed immediately prior to the procedure. Using sterile technique and 1% Lidocaine as local anesthetic, under direct ultrasound visualization, a 14 gauge spring-loaded device was used to perform biopsy of 1 of the  enlarged right axillary lymph nodes using a lateral/inferior approach. At the conclusion of the procedure a spiral shaped HydroMARK tissue marker clip was deployed into the biopsy cavity. Follow up 2 view mammogram was performed and dictated separately. IMPRESSION: Ultrasound guided biopsy of enlarged right axillary lymph node as discussed above. No apparent complications. Electronically Signed   By: Altamese Cabal M.D.   On: 08/23/2015 17:17    ASSESSMENT: 70 y.o. [place] woman status  post right breast upper outer quadrant and right axillary lymph node biopsy 08/23/2015, both positive for a clinical T1 N2, stage IIIA invasive ductal carcinoma, grade 2, strongly estrogen receptor positive, 5% progesterone receptor positive, with an MIB-120-30% and no HER-2 amplification  (a) biopsy of a 0.9 cm Right breast satellite nodule scheduled for 09/13/2015  (1) left breast biopsy 08/24/2015 shows a complex sclerosing lesion  (2) neoadjuvant chemotherapy will consist of cyclophosphamide and docetaxel every 21 days 4, with OnPro support, starting 09/12/2015  (3) definitive surgery to follow, likely including full axillary lymph node dissection  (4) adjuvant radiation to follow surgery  (5) anti-estrogens for 10 years to follow at the completion of local treatment-- consider PALLAS trial  (6) genetics testing pending ([place]kenazi descent)  PLAN: Perl's staging studies show no evidence of metastatic disease. We discussed that at length She does have some nonspecific lung nodules that we'll simply require follow-up. She does have a history of significant pulmonary infections in the past which likely accounts for this.  She is now ready to start her chemotherapy. She understands the need to start dexamethasone the day before treatment. I gave her a written "roadmap" on how to take her medicationsand she has all her medications in and.  i have omitted Zofran from the list because most of my patients end  up buying it and not using it. If she still has nausea problems days 3 and 4 despite the prochlorperazine she will call and let us know. She has TobraDex eyedrops at home for other reasons. She has not been using them lately. It would not hurt if she used them twice daily on days 1, 2 and 3 of each chemotherapy cycle to avoid inflammation of the lacrimal duct.  I have asked Nataliee to keep a diary of symptoms so we can discuss this when she returns for discussion on day 8. She will have OnPro, so does not need to return here for Neulasta. She will not be able to shower however while the OnPro is on.  She is also scheduled for biopsy of a satellite nodule on Tuesday she knows to call for any problems that may develop before her return visit here.  Brianna Cruel, MD   09/09/2015 10:02 AM Medical Oncology and Hematology [facility] [address] [place], [place] [address] Tel. [phone]    Fax. [phone]

## 2015-09-09 NOTE — Progress Notes (Signed)
Sent prior auth req for ondansetron

## 2015-09-09 NOTE — Progress Notes (Signed)
Introduced myself as her FA.  Informed her since she has 2 insurances she shouldn't need copay assistance and she informed me that they are in a good position and wouldn't want to take from others that are in need but thanked me for inquiring.  They have my card for any questions or concerns they may have in the future.

## 2015-09-09 NOTE — Telephone Encounter (Signed)
cxl appts per 5/12 pof. Printed updated avs

## 2015-09-12 ENCOUNTER — Encounter: Payer: Self-pay | Admitting: Oncology

## 2015-09-12 ENCOUNTER — Other Ambulatory Visit (HOSPITAL_BASED_OUTPATIENT_CLINIC_OR_DEPARTMENT_OTHER): Payer: Medicare Other

## 2015-09-12 ENCOUNTER — Ambulatory Visit (HOSPITAL_BASED_OUTPATIENT_CLINIC_OR_DEPARTMENT_OTHER): Payer: Medicare Other

## 2015-09-12 VITALS — BP 135/74 | HR 68 | Temp 98.8°F | Resp 18

## 2015-09-12 DIAGNOSIS — C50411 Malignant neoplasm of upper-outer quadrant of right female breast: Secondary | ICD-10-CM

## 2015-09-12 DIAGNOSIS — Z5111 Encounter for antineoplastic chemotherapy: Secondary | ICD-10-CM | POA: Diagnosis present

## 2015-09-12 LAB — CBC WITH DIFFERENTIAL/PLATELET
BASO%: 0.1 % (ref 0.0–2.0)
Basophils Absolute: 0 10*3/uL (ref 0.0–0.1)
EOS ABS: 0 10*3/uL (ref 0.0–0.5)
EOS%: 0 % (ref 0.0–7.0)
HCT: 40.8 % (ref 34.8–46.6)
HEMOGLOBIN: 14.2 g/dL (ref 11.6–15.9)
LYMPH%: 10.3 % — AB (ref 14.0–49.7)
MCH: 31.6 pg (ref 25.1–34.0)
MCHC: 34.8 g/dL (ref 31.5–36.0)
MCV: 90.9 fL (ref 79.5–101.0)
MONO#: 1.1 10*3/uL — ABNORMAL HIGH (ref 0.1–0.9)
MONO%: 8.3 % (ref 0.0–14.0)
NEUT%: 81.3 % — ABNORMAL HIGH (ref 38.4–76.8)
NEUTROS ABS: 10.4 10*3/uL — AB (ref 1.5–6.5)
Platelets: 316 10*3/uL (ref 145–400)
RBC: 4.49 10*6/uL (ref 3.70–5.45)
RDW: 13.4 % (ref 11.2–14.5)
WBC: 12.8 10*3/uL — AB (ref 3.9–10.3)
lymph#: 1.3 10*3/uL (ref 0.9–3.3)

## 2015-09-12 MED ORDER — PEGFILGRASTIM 6 MG/0.6ML ~~LOC~~ PSKT
6.0000 mg | PREFILLED_SYRINGE | Freq: Once | SUBCUTANEOUS | Status: DC
  Filled 2015-09-12: qty 0.6

## 2015-09-12 MED ORDER — PALONOSETRON HCL INJECTION 0.25 MG/5ML
INTRAVENOUS | Status: AC
Start: 2015-09-12 — End: 2015-09-12
  Filled 2015-09-12: qty 5

## 2015-09-12 MED ORDER — SODIUM CHLORIDE 0.9% FLUSH
10.0000 mL | INTRAVENOUS | Status: DC | PRN
  Administered 2015-09-12: 10 mL
  Filled 2015-09-12: qty 10

## 2015-09-12 MED ORDER — SODIUM CHLORIDE 0.9 % IV SOLN
Freq: Once | INTRAVENOUS | Status: AC
  Administered 2015-09-12: 13:00:00 via INTRAVENOUS

## 2015-09-12 MED ORDER — CYCLOPHOSPHAMIDE CHEMO INJECTION 1 GM
600.0000 mg/m2 | Freq: Once | INTRAMUSCULAR | Status: AC
  Administered 2015-09-12: 1120 mg via INTRAVENOUS
  Filled 2015-09-12: qty 56

## 2015-09-12 MED ORDER — SODIUM CHLORIDE 0.9 % IV SOLN
10.0000 mg | Freq: Once | INTRAVENOUS | Status: AC
  Administered 2015-09-12: 10 mg via INTRAVENOUS
  Filled 2015-09-12: qty 1

## 2015-09-12 MED ORDER — SODIUM CHLORIDE 0.9 % IV SOLN
75.0000 mg/m2 | Freq: Once | INTRAVENOUS | Status: AC
  Administered 2015-09-12: 140 mg via INTRAVENOUS
  Filled 2015-09-12: qty 14

## 2015-09-12 MED ORDER — PALONOSETRON HCL INJECTION 0.25 MG/5ML
0.2500 mg | Freq: Once | INTRAVENOUS | Status: AC
  Administered 2015-09-12: 0.25 mg via INTRAVENOUS

## 2015-09-12 MED ORDER — HEPARIN SOD (PORK) LOCK FLUSH 100 UNIT/ML IV SOLN
500.0000 [IU] | Freq: Once | INTRAVENOUS | Status: AC | PRN
  Administered 2015-09-12: 500 [IU]
  Filled 2015-09-12: qty 5

## 2015-09-12 NOTE — Progress Notes (Signed)
Neulasta Onpro not given today due to pt scheduled for MRI tomorrow.   Injection appt added for Wednesday afternoon.  Pt aware.  She can use Onpro on next chemo appt.Marland Kitchen

## 2015-09-12 NOTE — Patient Instructions (Signed)
[facility] Discharge Instructions for Patients Receiving Chemotherapy  Today you received the following chemotherapy agents;  Taxotere and Cytoxan.    To help prevent nausea and vomiting after your treatment, we encourage you to take your nausea medication as directed.     If you develop nausea and vomiting that is not controlled by your nausea medication, call the clinic.   BELOW ARE SYMPTOMS THAT SHOULD BE REPORTED IMMEDIATELY:  *FEVER GREATER THAN 100.5 F  *CHILLS WITH OR WITHOUT FEVER  NAUSEA AND VOMITING THAT IS NOT CONTROLLED WITH YOUR NAUSEA MEDICATION  *UNUSUAL SHORTNESS OF BREATH  *UNUSUAL BRUISING OR BLEEDING  TENDERNESS IN MOUTH AND THROAT WITH OR WITHOUT PRESENCE OF ULCERS  *URINARY PROBLEMS  *BOWEL PROBLEMS  UNUSUAL RASH Items with * indicate a potential emergency and should be followed up as soon as possible.  Feel free to call the clinic you have any questions or concerns. The clinic phone number is [phone].  Please show the CHEMO ALERT CARD at check-in to the Emergency Department and triage nurse.   

## 2015-09-12 NOTE — Progress Notes (Signed)
sent for prior auth- per [employer] approved 09/09/15-09/08/16-letter to come in mail

## 2015-09-13 ENCOUNTER — Ambulatory Visit
Admission: RE | Admit: 2015-09-13 | Discharge: 2015-09-13 | Disposition: A | Discharge status: Home / Self Care | Payer: Medicare Other | Source: Ambulatory Visit | Attending: General Surgery | Admitting: General Surgery

## 2015-09-13 DIAGNOSIS — N63 Unspecified lump in unspecified breast: Secondary | ICD-10-CM

## 2015-09-13 MED ORDER — GADOBENATE DIMEGLUMINE 529 MG/ML IV SOLN
15.0000 mL | Freq: Once | INTRAVENOUS | Status: AC | PRN
  Administered 2015-09-13: 15 mL via INTRAVENOUS

## 2015-09-14 ENCOUNTER — Ambulatory Visit (HOSPITAL_BASED_OUTPATIENT_CLINIC_OR_DEPARTMENT_OTHER): Payer: Medicare Other

## 2015-09-14 ENCOUNTER — Ambulatory Visit: Payer: Self-pay

## 2015-09-14 VITALS — BP 139/62 | HR 73 | Temp 98.1°F

## 2015-09-14 DIAGNOSIS — Z5189 Encounter for other specified aftercare: Secondary | ICD-10-CM

## 2015-09-14 DIAGNOSIS — C50411 Malignant neoplasm of upper-outer quadrant of right female breast: Secondary | ICD-10-CM | POA: Diagnosis present

## 2015-09-14 MED ORDER — PEGFILGRASTIM INJECTION 6 MG/0.6ML ~~LOC~~
6.0000 mg | PREFILLED_SYRINGE | Freq: Once | SUBCUTANEOUS | Status: AC
  Administered 2015-09-14: 6 mg via SUBCUTANEOUS
  Filled 2015-09-14: qty 0.6

## 2015-09-15 ENCOUNTER — Telehealth: Payer: Self-pay

## 2015-09-15 NOTE — Telephone Encounter (Signed)
-----   Message from Cathlean Cower, RN sent at 09/12/2015  4:49 PM EDT ----- Regarding: chemo f/u call 1st time Taxotere and Cytoxan on 5/15.  Dr. Jana Hakim.  Tolerated well.

## 2015-09-15 NOTE — Telephone Encounter (Signed)
Called [name] for chemotherapy F/U.  Patient is doing well.  Denies n/v.  Denies any new side effects or symptoms.  Bowel and bladder is functioning well.  Eating and drinking well and I instructed to drink 64 oz minimum daily or at least the day before, of and after treatment.  Denies questions at this time and encouraged to call if needed.  Reviewed how to call after hours in the case of an emergency.

## 2015-09-16 ENCOUNTER — Other Ambulatory Visit: Payer: Self-pay | Admitting: Nurse Practitioner

## 2015-09-16 ENCOUNTER — Other Ambulatory Visit: Payer: Self-pay | Admitting: *Deleted

## 2015-09-16 ENCOUNTER — Telehealth: Payer: Self-pay | Admitting: *Deleted

## 2015-09-16 DIAGNOSIS — C50411 Malignant neoplasm of upper-outer quadrant of right female breast: Secondary | ICD-10-CM

## 2015-09-16 NOTE — Telephone Encounter (Signed)
  Oncology Nurse Navigator Documentation    Navigator Encounter Type: Telephone (09/16/15 1600) Telephone: [name] Call;Appt Confirmation/Clarification (09/16/15 1600)                                        Time Spent with Patient: 30 (09/16/15 1600)

## 2015-09-19 ENCOUNTER — Telehealth: Payer: Self-pay | Admitting: Nurse Practitioner

## 2015-09-19 ENCOUNTER — Other Ambulatory Visit: Payer: Self-pay

## 2015-09-19 ENCOUNTER — Encounter: Payer: Self-pay | Admitting: Nurse Practitioner

## 2015-09-19 ENCOUNTER — Ambulatory Visit (HOSPITAL_BASED_OUTPATIENT_CLINIC_OR_DEPARTMENT_OTHER): Payer: Medicare Other | Admitting: Nurse Practitioner

## 2015-09-19 ENCOUNTER — Other Ambulatory Visit (HOSPITAL_BASED_OUTPATIENT_CLINIC_OR_DEPARTMENT_OTHER): Payer: Medicare Other

## 2015-09-19 VITALS — BP 135/78 | HR 87 | Temp 100.3°F | Resp 19 | Wt 171.8 lb

## 2015-09-19 DIAGNOSIS — D701 Agranulocytosis secondary to cancer chemotherapy: Secondary | ICD-10-CM

## 2015-09-19 DIAGNOSIS — R300 Dysuria: Secondary | ICD-10-CM

## 2015-09-19 DIAGNOSIS — C50411 Malignant neoplasm of upper-outer quadrant of right female breast: Secondary | ICD-10-CM

## 2015-09-19 DIAGNOSIS — T451X5A Adverse effect of antineoplastic and immunosuppressive drugs, initial encounter: Secondary | ICD-10-CM

## 2015-09-19 DIAGNOSIS — D709 Neutropenia, unspecified: Secondary | ICD-10-CM

## 2015-09-19 DIAGNOSIS — G4700 Insomnia, unspecified: Secondary | ICD-10-CM

## 2015-09-19 DIAGNOSIS — R509 Fever, unspecified: Secondary | ICD-10-CM | POA: Diagnosis not present

## 2015-09-19 DIAGNOSIS — K1231 Oral mucositis (ulcerative) due to antineoplastic therapy: Secondary | ICD-10-CM

## 2015-09-19 DIAGNOSIS — R21 Rash and other nonspecific skin eruption: Secondary | ICD-10-CM

## 2015-09-19 LAB — CBC WITH DIFFERENTIAL/PLATELET
BASO%: 0.3 % (ref 0.0–2.0)
Basophils Absolute: 0 10*3/uL (ref 0.0–0.1)
EOS%: 1 % (ref 0.0–7.0)
Eosinophils Absolute: 0 10*3/uL (ref 0.0–0.5)
HCT: 40.3 % (ref 34.8–46.6)
HGB: 14.1 g/dL (ref 11.6–15.9)
LYMPH%: 33 % (ref 14.0–49.7)
MCH: 31.3 pg (ref 25.1–34.0)
MCHC: 35 g/dL (ref 31.5–36.0)
MCV: 89.4 fL (ref 79.5–101.0)
MONO#: 1.2 10*3/uL — ABNORMAL HIGH (ref 0.1–0.9)
MONO%: 39.9 % — AB (ref 0.0–14.0)
NEUT%: 25.8 % — ABNORMAL LOW (ref 38.4–76.8)
NEUTROS ABS: 0.7 10*3/uL — AB (ref 1.5–6.5)
NRBC: 0 % (ref 0–0)
PLATELETS: 153 10*3/uL (ref 145–400)
RBC: 4.51 10*6/uL (ref 3.70–5.45)
RDW: 13 % (ref 11.2–14.5)
WBC: 2.9 10*3/uL — AB (ref 3.9–10.3)
lymph#: 1 10*3/uL (ref 0.9–3.3)

## 2015-09-19 LAB — COMPREHENSIVE METABOLIC PANEL
ALK PHOS: 133 U/L (ref 40–150)
ALT: 64 U/L — AB (ref 0–55)
AST: 33 U/L (ref 5–34)
Albumin: 3.3 g/dL — ABNORMAL LOW (ref 3.5–5.0)
Anion Gap: 10 mEq/L (ref 3–11)
BUN: 8.9 mg/dL (ref 7.0–26.0)
CALCIUM: 9.1 mg/dL (ref 8.4–10.4)
CHLORIDE: 94 meq/L — AB (ref 98–109)
CO2: 27 mEq/L (ref 22–29)
Creatinine: 0.9 mg/dL (ref 0.6–1.1)
EGFR: 64 mL/min/{1.73_m2} — AB (ref >90)
Glucose: 115 mg/dl (ref 70–140)
POTASSIUM: 3.9 meq/L (ref 3.5–5.1)
SODIUM: 131 meq/L — AB (ref 136–145)
Total Bilirubin: 0.55 mg/dL (ref 0.20–1.20)
Total Protein: 7 g/dL (ref 6.4–8.3)

## 2015-09-19 MED ORDER — VALACYCLOVIR HCL 500 MG PO TABS: 500.0000 mg | ORAL_TABLET | Freq: Every day | ORAL | Status: DC

## 2015-09-19 MED ORDER — CIPROFLOXACIN HCL 500 MG PO TABS: 500.0000 mg | ORAL_TABLET | Freq: Two times a day (BID) | ORAL | Status: DC

## 2015-09-19 MED ORDER — AZITHROMYCIN 250 MG PO TABS
ORAL_TABLET | ORAL | Status: DC
Start: 2015-09-19 — End: 2015-10-10

## 2015-09-19 NOTE — Telephone Encounter (Signed)
appt made and avs printed °

## 2015-09-19 NOTE — Progress Notes (Signed)
[name]  [phone] [phone]     ID: [name] DOB: 1945/10/07  MR#: [id number]  SWN#:[id number]  Patient Care Team: Seward Carol, MD as PCP - General (Internal Medicine) Chauncey Cruel, MD as Consulting Physician (Oncology) Fanny Skates, MD as Consulting Physician (General Surgery) Luberta Robertson, MD (Radiology) Hennie Duos, MD as Consulting Physician (Rheumatology) Domingo Pulse, MD (Urology) Druscilla Brownie, MD as Consulting Physician (Dermatology) PCP: Kandice Hams, MD GYN: OTHER MD:  CHIEF COMPLAINT: Locally advanced breast cancer  CURRENT TREATMENT: Neoadjuvant chemotherapy   BREAST CANCER HISTORY: From the original intake note:  [name] had routine mammographic screening in 08/18/2015 at the [facility], showing a right breast mass and possible axillary adenopathy. On 08/23/2015 she underwent bilateral diagnostic mammography with tomography and bilateral breast ultrasonography. The breast density was category B in the right breast there was a spiculated mass in the upper-outer quadrant measuring 1.2 cm and associated with pleomorphic calcifications. There were calcifications extending anteriorly and posteriorly from the mass worrisome for ductal carcinoma in situ. The total area in question is about 3 cm. There were also multiple enlarged right axillary lymph nodes. The mass was palpated as an area of fullness at the 12:00 position of the breast 3 cm from the nipple. There were palpable enlarged right axillary lymph nodes in the right axilla. Ultrasonography of the right breast confirmed an irregular hypoechoic mass in the area in question measuring 1.6 cm. There were multiple abnormal appearing enlarged right axillary lymph nodes, the largest measuring 2.3 cm.  In the left breast, mammography showed an area of architectural distortion associated with the left nipple. This was not palpable. Ultrasonography of the left  breast found no correlate. This area of the left breast was biopsied under tomography 08/24/2015. This showed (SAA [id number]) a complex sclerosing lesion and fibroadenoma.  On 08/23/2015 the patient underwent biopsy of the right breast mass in 1 of the abnormal right axillary lymph nodes. 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 Both biopsies were HER-2 nonamplified, the breast being ratio 1.27 and number per cell 1.90, and the lymph node 1 ratio 1.24 and number per cell 3.0.  On 08/26/2015 the patient underwent bilateral breast MRI. In the left breast there was no suspicious enhancement. The left axilla was benign. In the right breast there was a mass described as in the upper inner quadrant measuring 2.3 cm. Less than a centimeter away from this mass there was a satellite nodule measuring 0.9 cm. In the right axilla there were multiple enlarged right axillary lymph nodes, including a confluent region measuring up to 4 cm. There were also enlarged level II lymph nodes present measuring up to 2.1 cm.  The patient's subsequent history is as detailed below  INTERVAL HISTORY: [name] returns today for follow up of her breast cancer, accompanied by her husband, [name], and her daughter [name] via speakerphone. Today is day 8, cycle 1 of cyclophosphamide and docetaxel, with neulasta for granulocyte support. The patient has had a fever since Thursday, tmax 101.55F. Today her temp is 100.36F. She has redness surrounding the port site but also unrelated redness throughout her neck that is pruritic.   REVIEW OF SYSTEMS: [name] managed her nausea well. She was initially constipated but took an OTC "coctail" of drugs that eventually pushed her to diarrhea. She feels acidic,  complaining of burning urine and stools. She began to have mouth sores, and was  prescribed magic mouthwash, which is helping but a few of the sores persist. She is not sleeping well despite ativan use. She had bone pain, but was reluctant to take any pain medicine for fear of constipation. Her appetite is good. A detailed review of systems is otherwise stable.  PAST MEDICAL HISTORY: Past Medical History  Diagnosis Date  . Osteoporosis   . Menopausal symptoms   . Chronic vaginitis   . Fibromyalgia   . Interstitial cystitis   . Thyroid disease     hypothyroid  . Hypoglycemia   . Asthma   . PONV (postoperative nausea and vomiting)   . Cancer [facility])     breast cancer  . Pneumonia   . Depression     PAST SURGICAL HISTORY: Past Surgical History  Procedure Laterality Date  . Nasal sinus surgery      X4  . Bladder hyperextension      X4  . Bunionectomy    . Leg surgery  06/2004    Broken tibia  . Cholecystectomy  01/2006  . Cesarean section      DR. INGRAM  . Mouth surgery    . Dental surgery      13 dental implants  . Colonoscopy    . Portacath placement Left 09/08/2015    Procedure: INSERTION OF PORT-A-CATH ;  Surgeon: Fanny Skates, MD;  Location: [facility] OR;  Service: General;  Laterality: Left;    FAMILY HISTORY Family History  Problem Relation Age of Onset  . Hypertension Mother   . Heart disease Mother   . Parkinsonism Father   . Heart disease Sister   . Cancer Sister     MELANOMA  . Breast cancer Sister     Age 35  The patient's father died at age 63 from complications of Parkinson's disease. The patient's mother died age 29 with heart disease. [name] had no brothers. She had 2 sisters. One sister was diagnosed with breast cancer at age 23. She was tested for the BRCA gene and was negative. The same sister also had melanoma diagnosed in her 65s. The patient has one maternal great aunt diagnosed with breast cancer around age 84. There is no history of ovarian cancer in the family  GYNECOLOGIC HISTORY:  No LMP recorded. Patient is  postmenopausal. Menarche age 55, first live birth age 89. The patient is GX P2. She stopped having periods in her early 60s and took hormone replacement until her breast cancer diagnosis, April 2017.  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 He has been in remission for more than 10 years. Their daughter [name] lives in [place] were she is Production designer, theatre/television/film of the [employer]. Daughter [name] is an infectious disease M.D. working and teaching at [employer] in [place]. The patient has one granddaughter, 62 months old as of May 2017. The family attends [employer]: Not in place   HEALTH MAINTENANCE: Social History  Substance Use Topics  . Smoking status: Former Smoker    Types: Cigarettes  . Smokeless tobacco: Never Used  . Alcohol Use: 0.0 oz/week    0 Standard drinks or equivalent per week     Comment: social     Colonoscopy:  2016/ [facility]  PAP: 2015  Bone density:   Lipid panel:  Allergies  Allergen Reactions  . Fluconazole     REACTION: elavated LFTs  . Nitrofurantoin Other (See Comments)    Unknown reaction  . Penicillins Other (See Comments)    Has patient had a PCN reaction causing immediate rash, facial/tongue/throat swelling, SOB or lightheadedness with hypotension: No Has patient had a PCN reaction causing severe rash involving mucus membranes or skin necrosis: No Has patient had a PCN reaction that required hospitalization No Has patient had a PCN reaction occurring within the last 10 years: No If all of the above answers are "NO", then may proceed with Cephalosporin use.  . Other Rash    Adhesive tape    Current Outpatient Prescriptions  Medication Sig Dispense Refill  . azithromycin (ZITHROMAX) 250 MG tablet Take as package  instructs 6 each 0  . buPROPion (WELLBUTRIN XL) 300 MG 24 hr tablet Take 300 mg by mouth daily.      . calcitonin, salmon, (MIACALCIN/FORTICAL) 200 UNIT/ACT nasal spray Place 1 spray into alternate nostrils daily. 3.7 mL 11  . Calcium Carbonate-Vitamin D (CALCIUM + D PO) Take 1 tablet by mouth daily.     . ciprofloxacin (CIPRO) 500 MG tablet Take 1 tablet (500 mg total) by mouth 2 (two) times daily. 10 tablet 0  . clotrimazole-betamethasone (LOTRISONE) cream Apply topically 2 (two) times daily. (Patient taking differently: Apply 1 application topically as needed. Apply to affected area as needed) 45 g 3  . dexamethasone (DECADRON) 4 MG tablet Take 2 tablets (8 mg total) by mouth 2 (two) times daily. Start the day before Taxotere. Then again the day after chemo for 3 days. 30 tablet 1  . diazepam (VALIUM) 5 MG tablet Take 5 mg by mouth every 6 (six) hours as needed for anxiety.     . hydrOXYzine (ATARAX/VISTARIL) 25 MG tablet Take 25 mg by mouth 3 (three) times daily as needed for anxiety or itching.    . Levothyroxine Sodium (SYNTHROID PO) Take 137 mcg by mouth daily.     Marland Kitchen lidocaine-prilocaine (EMLA) cream Apply to affected area once (Patient taking differently: Apply 1 application topically as needed. Apply to affected area once) 30 g 3  . LORazepam (ATIVAN) 0.5 MG tablet Take 1 tablet (0.5 mg total) by mouth at bedtime as needed (Nausea or vomiting). 30 tablet 0  . Multiple Vitamin (MULTIVITAMIN) capsule Take 1 capsule by mouth daily.      . nabumetone (RELAFEN) 750 MG tablet Take 750 mg by mouth 2 (two) times daily as needed for mild pain.    Marland Kitchen ondansetron (ZOFRAN) 8 MG tablet Take 1 tablet (8 mg total) by mouth 2 (two) times daily as needed for refractory nausea / vomiting. Start on day 3 after chemo. 30 tablet 1  . oxyCODONE-acetaminophen (PERCOCET) 7.5-325 MG tablet Take 1 tablet by mouth every 4 (four) hours as needed for severe pain. 30 tablet 0  . pantoprazole (PROTONIX) 40 MG tablet Take  40 mg by mouth daily.    . prochlorperazine (COMPAZINE) 10 MG tablet Take 1 tablet (10 mg total) by mouth every 6 (six) hours as needed (Nausea or vomiting). 30 tablet 1  . sulfamethoxazole-trimethoprim (BACTRIM DS,SEPTRA DS) 800-160 MG per tablet Take 1 tablet by mouth 2 (two) times daily. (Patient taking differently: Take 1 tablet by mouth as needed. ) 14 tablet 1  . terconazole (TERAZOL 7) 0.4 % vaginal cream Place 1 applicator vaginally at bedtime. (Patient  taking differently: Place 1 applicator vaginally at bedtime as needed. ) 45 g 1  . tobramycin (TOBREX) 0.3 % ophthalmic solution Place 1-2 drops into the right eye 4 (four) times daily. X 5 days (Patient taking differently: Place 1-2 drops into the right eye 4 (four) times daily as needed. ) 5 mL 0  . Triamterene-HCTZ (DYAZIDE PO) Take 1 tablet by mouth daily.     Marland Kitchen triamterene-hydrochlorothiazide (MAXZIDE) 75-50 MG tablet Take 1 tablet by mouth daily.    . valACYclovir (VALTREX) 1000 MG tablet Take 1 tablet (1,000 mg total) by mouth daily. (Patient taking differently: Take 1,000 mg by mouth daily as needed. ) 30 tablet 1  . valACYclovir (VALTREX) 500 MG tablet Take 1 tablet (500 mg total) by mouth daily. 30 tablet 1  . Vitamin D, Ergocalciferol, (DRISDOL) 50000 UNITS CAPS capsule One tablet every other week (Patient taking differently: Take 50,000 Units by mouth every 14 (fourteen) days. One tablet every other week) 10 capsule 3  . zolpidem (AMBIEN) 10 MG tablet Take 10 mg by mouth at bedtime as needed for sleep.      No current facility-administered medications for this visit.    OBJECTIVE: Middle-aged white woman who appears stated age 70 Vitals:   09/19/15 1353  BP: 135/78  Pulse: 87  Temp: 100.3 F (37.9 C)  Resp: 19     Body mass index is 30.44 kg/(m^2).    ECOG FS:1 - Symptomatic but completely ambulatory  Skin: erythema and tenderness 3 inches in circumference around port, mild erythema from clavicles up to neck HEENT:  sclerae anicteric, conjunctivae pink. No thrush. Multiple lesions to right tongue and roof of mouth. Lymph Nodes: No cervical or supraclavicular lymphadenopathy  Lungs: clear to auscultation bilaterally, no rales, wheezes, or rhonci  Heart: regular rate and rhythm  Abdomen: round, soft, non tender, positive bowel sounds  Musculoskeletal: No focal spinal tenderness, no peripheral edema  Neuro: non focal, well oriented, positive affect  Breasts: large bruise surrounding right nipple. Healing bruises to left breast.   LAB RESULTS:  CMP     Component Value Date/Time   NA 131* 09/19/2015 1337   K 3.9 09/19/2015 1337   CO2 27 09/19/2015 1337   GLUCOSE 115 09/19/2015 1337   BUN 8.9 09/19/2015 1337   CREATININE 0.9 09/19/2015 1337   CALCIUM 9.1 09/19/2015 1337   PROT 7.0 09/19/2015 1337   ALBUMIN 3.3* 09/19/2015 1337   AST 33 09/19/2015 1337   ALT 64* 09/19/2015 1337   ALKPHOS 133 09/19/2015 1337   BILITOT 0.55 09/19/2015 1337    INo results found for: SPEP, UPEP  Lab Results  Component Value Date   WBC 2.9* 09/19/2015   NEUTROABS 0.7* 09/19/2015   HGB 14.1 09/19/2015   HCT 40.3 09/19/2015   MCV 89.4 09/19/2015   PLT 153 09/19/2015      Chemistry      Component Value Date/Time   NA 131* 09/19/2015 1337   K 3.9 09/19/2015 1337   CO2 27 09/19/2015 1337   BUN 8.9 09/19/2015 1337   CREATININE 0.9 09/19/2015 1337      Component Value Date/Time   CALCIUM 9.1 09/19/2015 1337   ALKPHOS 133 09/19/2015 1337   AST 33 09/19/2015 1337   ALT 64* 09/19/2015 1337   BILITOT 0.55 09/19/2015 1337       No results found for: LABCA2  No components found for: LABCA125  No results for input(s): INR in the last 168 hours.  Urinalysis No results  found for: COLORURINE, APPEARANCEUR, LABSPEC, PHURINE, GLUCOSEU, HGBUR, BILIRUBINUR, KETONESUR, PROTEINUR, UROBILINOGEN, NITRITE, LEUKOCYTESUR    ELIGIBLE FOR AVAILABLE RESEARCH PROTOCOL: no  STUDIES: Ct Chest W Contrast  09/06/2015   CLINICAL DATA:  New diagnosis RIGHT breast cancer. Initial evaluation EXAM: CT CHEST WITH CONTRAST TECHNIQUE: Multidetector CT imaging of the chest was performed during intravenous contrast administration. CONTRAST:  75 mL Isovue COMPARISON:  None. FINDINGS: Mediastinum/Nodes: Multiple enlarged RIGHT axillary lymph nodes. Example lymph node measures 17 mm short axis on image 23, series 2. Lymph node beneath the RIGHT sub pectoralis muscle measures 13 mm short axis on 20, series 2. No supraclavicular or infraclavicular adenopathy present. No internal mammary adenopathy.  No mediastinal adenopathy. No central pulmonary embolism. Coronary calcifications. Esophagus normal. Lungs/Pleura: Multiple scattered noncalcified pulmonary nodules which are less than 5 mm throughout the lungs. For example 3 mm RIGHT upper lobe nodule on image 30, series 5. 3 mm nodule on image 27, series 5. 4 mm LEFT upper lobe nodule image 30, series 5. These nodules have an upper lobe predominance. Several ringed nodules present in the LEFT lower lobe (image 121, series 5 example measuring 4 mm). Upper abdomen: Postcholecystectomy. Adrenal glands are normal. Upper poles of the kidneys are normal. No upper abdominal adenopathy. Musculoskeletal: No aggressive osseous lesion. IMPRESSION: 1. Metastatic adenopathy to the RIGHT axilla and sub pectoralis nodal stations. 2. No central metastatic adenopathy. 3. Multiple sub 5 mm noncalcified nodules predominantly within the upper lobes but also within the lower lobes. Common differential includes noncalcified granulomatous disease versus metastatic disease. The uniformity of the nodular size favors a non metastatic process. These nodules are too small to be evaluated by FDG PET scan. Electronically Signed   By: Suzy Bouchard M.D.   On: 09/06/2015 09:00   Nm Bone Scan Whole Body  09/06/2015  CLINICAL DATA:  Breast cancer staging ; the patient reports lumbar back pain and history of a tibial fracture,  no recent trauma. EXAM: NUCLEAR MEDICINE WHOLE BODY BONE SCAN TECHNIQUE: Whole body anterior and posterior images were obtained approximately 3 hours after intravenous injection of radiopharmaceutical. RADIOPHARMACEUTICALS:  26.0 mCi Technetium-14mMDP IV COMPARISON:  None in PACs FINDINGS: There is adequate uptake of the radiopharmaceutical by the skeleton. There is adequate soft tissue clearance and renal activity. Uptake within the calvarium is normal. Mildly increased uptake posteriorly in the mid cervical spine is compatible with degenerative change. Minimal increased uptake at approximately T3 and T10 posteriorly is consistent with degenerative change. Mildly increased uptake at approximately L4 is consistent with degenerative change. Uptake within the ribs is normal. There is focally increased uptake in the first carpometacarpal joint on the left. Uptake within the pelvis and thighs and legs is normal. Mildly increased uptake in the midfoot on the right is present. IMPRESSION: No uptake demonstrated to suggest metastatic disease. Areas of increased uptake described above are most compatible with degenerative change. Electronically Signed   By: David  JMartiniqueM.D.   On: 09/06/2015 11:20   Mr Breast Bilateral W Wo Contrast  08/29/2015  ADDENDUM REPORT: 08/29/2015 15:18 ADDENDUM: Corrected report: Lymph nodes: Multiple enlarged right axillary lymph nodes are identified. The largest mass measured is likely a confluence of multiple nodes and measures approximately 4.0 cm. Per discussion with Dr. IDalbert Batman patient will be scheduled for MR guided biopsy and clip placement of the satellite nodule within the right breast. Electronically Signed   By: ENolon NationsM.D.   On: 08/29/2015 15:18  08/29/2015  CLINICAL DATA:  Recent diagnosis of invasive and in situ ductal carcinoma following a biopsy of the right breast 12 o'clock location. Biopsy of enlarged right axillary lymph node demonstrated metastatic carcinoma.  Stereotactic guided core biopsy of distortion in the 1 o'clock location shows a complex sclerosing lesion without atypia. LABS:  Creatinine was obtained on site at [facility] at [address]Results: Creatinine 1.0 mg/dL. EXAM: BILATERAL BREAST MRI WITH AND WITHOUT CONTRAST TECHNIQUE: Multiplanar, multisequence MR images of both breasts were obtained prior to and following the intravenous administration of 15 ml of MultiHance. THREE-DIMENSIONAL MR IMAGE RENDERING ON INDEPENDENT WORKSTATION: Three-dimensional MR images were rendered by post-processing of the original MR data on an independent workstation. The three-dimensional MR images were interpreted, and findings are reported in the following complete MRI report for this study. Three dimensional images were evaluated at the independent DynaCad workstation COMPARISON:  Recent mammogram and ultrasound exams from the [facility] dated 08/24/2015 and earlier FINDINGS: Breast composition: b. Scattered fibroglandular tissue. Background parenchymal enhancement: Minimal Right breast: There is an irregular mass within the upper inner quadrant of the right breast measuring 2.3 x 2.3 x 1.2 cm appear within the mass there is tissue marker clip from recent ultrasound-guided core biopsy which demonstrated invasive ductal carcinoma and ductal carcinoma in situ. 5 mm medial and inferior to this mass, there is a small irregular enhancing mass which measures 7 x 9 mm. Given the irregularity and appearance, this is favored to represent a small satellite nodule. Enhancement within both areas is rapid wash-in and washout type kinetics. No other suspicious abnormalities are identified within the right breast. Left breast: There is tissue marker clip artifact within the upper-outer quadrant of the left breast following a recent stereotactic guided core biopsy which demonstrated complex sclerosing lesion. No suspicious enhancement is identified in  the left breast. Excision has been recommended of this lesion. Lymph nodes: Multiple enlarged right axillary lymph nodes are identified. The largest is likely ache and fluid region of multiple nodes measuring approximately 4.0 cm. Enlarged level 2 lymph nodes are present, measuring up to 2.1 cm. Left axilla and internal mammary chain are negative for adenopathy. Ancillary findings:  None. IMPRESSION: 1. Known malignancy measuring 2.3 x 2.3 cm in the upper inner quadrant of the right breast. 2. Small adjacent satellite nodule along the lower medial aspect of the known malignancy measuring 7 x 9 mm. 3. Multiple large right axillary lymph nodes including level 2 nodes. Findings are consistent with known metastatic disease. 4. Expected post biopsy changes in the left breast without significant enhancement following biopsy demonstrating complex sclerosing lesion. RECOMMENDATION: Treatment plan for known right breast cancer. BI-RADS CATEGORY  6: Known biopsy-proven malignancy. Electronically Signed: By: Nolon Nations M.D. On: 08/29/2015 11:44   Dg Chest Port 1 View  09/08/2015  CLINICAL DATA:  Port-A-Cath placement EXAM: PORTABLE CHEST 1 VIEW COMPARISON:  07/31/2011 FINDINGS: Borderline cardiomegaly. Mild hyperinflation again noted. No acute infiltrate or pulmonary edema. There is left subclavian Port-A-Cath with tip in distal SVC. No pneumothorax. IMPRESSION: Mild hyperinflation. No active disease. Left subclavian Port-A-Cath in place. No pneumothorax. Electronically Signed   By: Lahoma Crocker M.D.   On: 09/08/2015 16:00   Mm Digital Diagnostic Unilat R  09/13/2015  CLINICAL DATA:  Status post MRI-guided biopsy of a suspicious enhancing mass in the right breast earlier today. Recently diagnosed right breast cancer within the upper inner quadrant. Subsequent MRI showed an additional suspicious enhancing satellite mass within the inner right  breast, slightly medial and inferior to the biopsy-proven cancer. The  additional satellite mass was biopsied today. EXAM: DIAGNOSTIC RIGHT MAMMOGRAM POST MRI BIOPSY COMPARISON:  Previous exam(s). FINDINGS: Mammographic images were obtained following MRI guided biopsy of the right breast mass located slightly medial and inferior to patient's biopsy-proven cancer. At the conclusion of this procedure, a barbell shaped tissue marker was deployed in the biopsy cavity. This clip has been displaced approximately 10 mm anteriorly by a post biopsy hematoma. IMPRESSION: Postprocedure mammogram for clip placement. BARBELL SHAPED CLIP placed today after biopsy of the satellite mass located slightly medial and inferior to the biopsy-proven right breast cancer. Today's CLIP IS DISPLACED 10 MM ANTERIORLY by an associated post biopsy hematoma. Ribbon shaped clip within the upper right breast was placed at the time of the earlier biopsy, denoting the site of patient's known biopsy-proven cancer. Final Assessment: Post Procedure Mammograms for Marker Placement Electronically Signed   By: Franki Cabot M.D.   On: 09/13/2015 09:35   Mm Digital Diagnostic Unilat R  08/23/2015  CLINICAL DATA:  Post right breast ultrasound-guided biopsy. EXAM: DIAGNOSTIC RIGHT MAMMOGRAM POST ULTRASOUND BIOPSY COMPARISON:  Previous exam(s). FINDINGS: Mammographic images were obtained following ultrasound guided biopsy of the spiculated mass located within the right breast at 12 o'clock position an enlarged right axillary lymph node. The ribbon shaped clip is in appropriate position associated with spiculated mass located within the right breast at 12 o'clock position. Spiral shaped HydroMARK clip is in appropriate position associated with an enlarged right axillary lymph node as seen in the right MLO projection. IMPRESSION: Appropriate positioning of clips following right breast and right axillary lymph node ultrasound-guided core biopsies. Final Assessment: Post Procedure Mammograms for Marker Placement Electronically  Signed   By: Altamese Cabal M.D.   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 Patient returns for results following left breast tomosynthesis guided biopsy. Current Pain Level: 3 HISTORY OF PRESENT ILLNESS: Diagnostic evaluation demonstrated a spiculated mass with calcifications located superiorly within the right breast with abnormal appearing right axillary lymph nodes. Right breast and axillary lymph node ultrasound-guided core biopsies were performed on 08/23/2015. An additional area of subtle distortion was noted within the left breast and tomosynthesis guided biopsy of the left breast was performed on 08/24/2015. The patient returns for results. EXAM: There is moderate ecchymosis present bilaterally related to the patient's bilateral ultrasound-guided biopsies. There is no hematoma formation or signs of infection. PATHOLOGY: The pathology associated with the left breast tomosynthesis guided biopsy of the subtle area of distortion located within the anterior left breast at the 1 o'clock position demonstrated a complex sclerosing lesion. Pathology is concordant with the imaging findings. ASSESSMENT AND PLAN: ASSESSMENT AND PLAN 1. Complex sclerosing lesion associated with the subtle area of distortion located within the left breast 1 o'clock position. Excision is recommended. 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 The patient is scheduled to see Dr.Magrinat on 08/31/2015. The patient has been rescheduled to see Dr. Dalbert Batman for surgical consultation on 08/26/2015. Post biopsy wound care instructions were reviewed with the patient. The patient was encouraged to call our breast center for additional questions or concerns. Electronically Signed    By: Altamese Cabal M.D.   On: 08/25/2015 14:43   Mm  Radiologist Eval And Mgmt  08/24/2015  EXAM: ESTABLISHED PATIENT OFFICE VISIT - LEVEL II CHIEF COMPLAINT: Patient returns for results following right breast and axillary lymph node ultrasound-guided core biopsies. Current Pain Level: 3 HISTORY OF PRESENT ILLNESS: The patient noted a of palpable mass within the right breast. Evaluation demonstrated a mass located within the right breast superiorly and right axillary adenopathy. Also, there is possible distortion located within the left breast 1 o'clock position. Patient underwent right breast and right axillary lymph node ultrasound-guided core biopsies on 08/23/2015 and returns for results. EXAM: There is mild ecchymosis associated with the right breast and right axillary lymph node ultrasound-guided core biopsy sites. There is no hematoma formation and no signs of infection. PATHOLOGY: The pathology demonstrated invasive ductal carcinoma with DCIS and a metastatic right axillary lymph node. The pathology is concordant with the imaging findings. I have discussed the findings with the patient and her husband and answered their questions. 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 Plan is to perform left breast tomosynthesis guided biopsy 08/24/2015 of an area of possible subtle distortion located within the left breast at the 1 o'clock position. Patient is scheduled for Medical Oncology consultation with Dr. Jana Hakim on 08/31/2015. The patient is scheduled see Dr. Fanny Skates for surgical consultation on 09/08/2015. Post biopsy wound care instructions were reviewed with the patient. Electronically Signed   By: Altamese Cabal M.D.   On: 08/24/2015 14:14   Dg Fluoro Guide Cv Line-no Report  09/08/2015  CLINICAL DATA:  FLOURO GUIDE CV LINE Fluoroscopy was utilized by the requesting physician.  No radiographic  interpretation.   US Breast Ltd Uni Left Inc Axilla  08/23/2015  CLINICAL DATA:  Recall from screening mammography. EXAM: 2D DIGITAL DIAGNOSTIC BILATERAL MAMMOGRAM WITH CAD AND ADJUNCT TOMO ULTRASOUND BILATERAL BREAST COMPARISON:  08/11/2015, 01/15/2012, 01/09/2011. ACR Breast Density Category b: There are scattered areas of fibroglandular density. FINDINGS: Additional views of the right breast demonstrate a spiculated mass to be present located within the right breast at the 11:30 to 12 o'clock position with associated pleomorphic calcifications within the mass. The mass measures 1.2 cm by mammography. In addition, there are pleomorphic calcifications extending anteriorly and posteriorly from the mass which are worrisome for possible DCIS. The mass and calcifications span approximately 3 cm. In addition, there are multiple enlarged right axillary lymph nodes present. Additional tomosynthesis views of the left breast demonstrate subtle possible architectural distortion located slightly lateral and superior to the left nipple within the anterior 1/3 of the breast. There is a small adjacent oval circumscribed nodule present which has been stable when compared with mammograms dating back to 2007. Mammographic images were processed with CAD. On physical exam, there is a fullness present within the right breast at 12 o'clock position 3 cm from the nipple. There are palpable enlarged right axillary lymph nodes present within the low right axilla. There is no palpable abnormality within the left breast. Targeted ultrasound is performed, showing an irregularly marginated hypoechoic mass with shadowing located within the right breast at the 12 o'clock position 3 cm from the nipple measuring 1.6 x 1.2 x 1.2 cm in size. This is worrisome for invasive mammary carcinoma. In addition, there are multiple abnormal appearing enlarged right axillary lymph nodes present with loss of normal hilar fat and increased vascularity. The  largest lymph node measures 2.3 x 2.3 x 1.3 cm in size. Ultrasound-guided core biopsy of the right  breast mass and 1 of the enlarged right axillary lymph node is recommended. This will be performed today and reported separately. Ultrasound of the left breast in the area of questioned distortion noted on the recent mammogram demonstrates no definite ultrasound correlate. As a result, tomosynthesis guided biopsy of the possible subtle distortion within the left breast is recommended and will be scheduled. IMPRESSION: 1.Spiculated mass with associated worrisome calcifications located within the right breast the 12 o'clock position worrisome for invasive mammary carcinoma and adjacent DCIS. This is associated with multiple enlarged right axillary lymph nodes. 2. Question subtle persistent distortion located slightly lateral and superior to the left nipple within the anterior 1/3 of the left breast. Tomosynthesis guided biopsy is recommended and will be scheduled. RECOMMENDATION: 1. Right breast ultrasound-guided core biopsy at the 12 o'clock position. 2. Right axillary lymph node ultrasound-guided core biopsy. 3. Left breast tomosynthesis guided biopsy of questionable subtle distortion. I have discussed the findings and recommendations with the patient. Results were also provided in writing at the conclusion of the visit. If applicable, a reminder letter will be sent to the patient regarding the next appointment. BI-RADS CATEGORY  5: Highly suggestive of malignancy. Electronically Signed   By: Altamese Cabal M.D.   On: 08/23/2015 17:08   US Breast Ltd Uni Right Inc Axilla  08/23/2015  CLINICAL DATA:  Recall from screening mammography. EXAM: 2D DIGITAL DIAGNOSTIC BILATERAL MAMMOGRAM WITH CAD AND ADJUNCT TOMO ULTRASOUND BILATERAL BREAST COMPARISON:  08/11/2015, 01/15/2012, 01/09/2011. ACR Breast Density Category b: There are scattered areas of fibroglandular density. FINDINGS: Additional views of the right breast  demonstrate a spiculated mass to be present located within the right breast at the 11:30 to 12 o'clock position with associated pleomorphic calcifications within the mass. The mass measures 1.2 cm by mammography. In addition, there are pleomorphic calcifications extending anteriorly and posteriorly from the mass which are worrisome for possible DCIS. The mass and calcifications span approximately 3 cm. In addition, there are multiple enlarged right axillary lymph nodes present. Additional tomosynthesis views of the left breast demonstrate subtle possible architectural distortion located slightly lateral and superior to the left nipple within the anterior 1/3 of the breast. There is a small adjacent oval circumscribed nodule present which has been stable when compared with mammograms dating back to 2007. Mammographic images were processed with CAD. On physical exam, there is a fullness present within the right breast at 12 o'clock position 3 cm from the nipple. There are palpable enlarged right axillary lymph nodes present within the low right axilla. There is no palpable abnormality within the left breast. Targeted ultrasound is performed, showing an irregularly marginated hypoechoic mass with shadowing located within the right breast at the 12 o'clock position 3 cm from the nipple measuring 1.6 x 1.2 x 1.2 cm in size. This is worrisome for invasive mammary carcinoma. In addition, there are multiple abnormal appearing enlarged right axillary lymph nodes present with loss of normal hilar fat and increased vascularity. The largest lymph node measures 2.3 x 2.3 x 1.3 cm in size. Ultrasound-guided core biopsy of the right breast mass and 1 of the enlarged right axillary lymph node is recommended. This will be performed today and reported separately. Ultrasound of the left breast in the area of questioned distortion noted on the recent mammogram demonstrates no definite ultrasound correlate. As a result, tomosynthesis  guided biopsy of the possible subtle distortion within the left breast is recommended and will be scheduled. IMPRESSION: 1.Spiculated mass with associated worrisome calcifications  located within the right breast the 12 o'clock position worrisome for invasive mammary carcinoma and adjacent DCIS. This is associated with multiple enlarged right axillary lymph nodes. 2. Question subtle persistent distortion located slightly lateral and superior to the left nipple within the anterior 1/3 of the left breast. Tomosynthesis guided biopsy is recommended and will be scheduled. RECOMMENDATION: 1. Right breast ultrasound-guided core biopsy at the 12 o'clock position. 2. Right axillary lymph node ultrasound-guided core biopsy. 3. Left breast tomosynthesis guided biopsy of questionable subtle distortion. I have discussed the findings and recommendations with the patient. Results were also provided in writing at the conclusion of the visit. If applicable, a reminder letter will be sent to the patient regarding the next appointment. BI-RADS CATEGORY  5: Highly suggestive of malignancy. Electronically Signed   By: Altamese Cabal M.D.   On: 08/23/2015 17:08   Mm Diag Breast Tomo Uni Left  08/24/2015  CLINICAL DATA:  Post left breast tomosynthesis guided biopsy. EXAM: DIAGNOSTIC LEFT MAMMOGRAM POST TOMOSYNTHESIS GUIDED BIOPSY COMPARISON:  Previous exam(s). FINDINGS: Mammographic images were obtained following tomosynthesis guided biopsy of a subtle area of possible distortion located within the left breast at the 1 o'clock position. The X shaped clip appears in appropriate position. IMPRESSION: Appropriate position of clip following left breast tomosynthesis guided biopsy. Final Assessment: Post Procedure Mammograms for Marker Placement Electronically Signed   By: Altamese Cabal M.D.   On: 08/24/2015 14:09   Mm Diag Breast Tomo Bilateral  08/23/2015  CLINICAL DATA:  Recall from screening mammography. EXAM: 2D DIGITAL  DIAGNOSTIC BILATERAL MAMMOGRAM WITH CAD AND ADJUNCT TOMO ULTRASOUND BILATERAL BREAST COMPARISON:  08/11/2015, 01/15/2012, 01/09/2011. ACR Breast Density Category b: There are scattered areas of fibroglandular density. FINDINGS: Additional views of the right breast demonstrate a spiculated mass to be present located within the right breast at the 11:30 to 12 o'clock position with associated pleomorphic calcifications within the mass. The mass measures 1.2 cm by mammography. In addition, there are pleomorphic calcifications extending anteriorly and posteriorly from the mass which are worrisome for possible DCIS. The mass and calcifications span approximately 3 cm. In addition, there are multiple enlarged right axillary lymph nodes present. Additional tomosynthesis views of the left breast demonstrate subtle possible architectural distortion located slightly lateral and superior to the left nipple within the anterior 1/3 of the breast. There is a small adjacent oval circumscribed nodule present which has been stable when compared with mammograms dating back to 2007. Mammographic images were processed with CAD. On physical exam, there is a fullness present within the right breast at 12 o'clock position 3 cm from the nipple. There are palpable enlarged right axillary lymph nodes present within the low right axilla. There is no palpable abnormality within the left breast. Targeted ultrasound is performed, showing an irregularly marginated hypoechoic mass with shadowing located within the right breast at the 12 o'clock position 3 cm from the nipple measuring 1.6 x 1.2 x 1.2 cm in size. This is worrisome for invasive mammary carcinoma. In addition, there are multiple abnormal appearing enlarged right axillary lymph nodes present with loss of normal hilar fat and increased vascularity. The largest lymph node measures 2.3 x 2.3 x 1.3 cm in size. Ultrasound-guided core biopsy of the right breast mass and 1 of the enlarged  right axillary lymph node is recommended. This will be performed today and reported separately. Ultrasound of the left breast in the area of questioned distortion noted on the recent mammogram demonstrates no definite ultrasound correlate. As  a result, tomosynthesis guided biopsy of the possible subtle distortion within the left breast is recommended and will be scheduled. IMPRESSION: 1.Spiculated mass with associated worrisome calcifications located within the right breast the 12 o'clock position worrisome for invasive mammary carcinoma and adjacent DCIS. This is associated with multiple enlarged right axillary lymph nodes. 2. Question subtle persistent distortion located slightly lateral and superior to the left nipple within the anterior 1/3 of the left breast. Tomosynthesis guided biopsy is recommended and will be scheduled. RECOMMENDATION: 1. Right breast ultrasound-guided core biopsy at the 12 o'clock position. 2. Right axillary lymph node ultrasound-guided core biopsy. 3. Left breast tomosynthesis guided biopsy of questionable subtle distortion. I have discussed the findings and recommendations with the patient. Results were also provided in writing at the conclusion of the visit. If applicable, a reminder letter will be sent to the patient regarding the next appointment. BI-RADS CATEGORY  5: Highly suggestive of malignancy. Electronically Signed   By: Altamese Cabal M.D.   On: 08/23/2015 17:08   Mm Lt Breast Bx W Loc Dev 1st Lesion Image Bx Spec Stereo Guide  08/24/2015  CLINICAL DATA:  Recently diagnosed right breast invasive ductal carcinoma with DCIS and metastatic right axillary lymph node. Area of subtle possible distortion located within the left breast anteriorly at the 1 o'clock position. Biopsy requested. EXAM: LEFT BREAST TOMOSYNTHESIS GUIDED CORE NEEDLE BIOPSY COMPARISON:  Previous exams. FINDINGS: The patient and I discussed the procedure of stereotactic-guided biopsy including benefits and  alternatives. We discussed the high likelihood of a successful procedure. We discussed the risks of the procedure including infection, bleeding, tissue injury, clip migration, and inadequate sampling. Informed written consent was given. The usual time out protocol was performed immediately prior to the procedure. Using sterile technique and 1% Lidocaine as local anesthetic, under stereotactic guidance, a 9 gauge vacuum assisted device was used to perform core needle biopsy of the area of subtle distortion located within the anterior portion of the left breast at the 1 o'clock position using a superior craniocaudal approach. No specimen mammogram was obtained. At the conclusion of the procedure, an X shaped tissue marker clip was deployed into the biopsy cavity. Follow-up 2-view mammogram was performed and dictated separately. IMPRESSION: Tomosynthesis guided biopsy of subtle possible left breast distortion. No apparent complications. Electronically Signed   By: Altamese Cabal M.D.   On: 08/24/2015 14:07   [name] Rt Breast Bx W Loc Dev 1st Lesion Img Bx Spec US Guide  08/23/2015  CLINICAL DATA:  Right breast mass and enlarged right axillary lymph nodes. EXAM: ULTRASOUND GUIDED RIGHT BREAST CORE NEEDLE BIOPSY COMPARISON:  Previous exam(s). FINDINGS: I met with the patient and we discussed the procedure of ultrasound-guided biopsy, including benefits and alternatives. We discussed the high likelihood of a successful procedure. We discussed the risks of the procedure, including infection, bleeding, tissue injury, clip migration, and inadequate sampling. Informed written consent was given. The usual time-out protocol was performed immediately prior to the procedure. Using sterile technique and 1% Lidocaine as local anesthetic, under direct ultrasound visualization, a 14 gauge spring-loaded device was used to perform biopsy of the spiculated mass located within the right breast at the 12 o'clock position using a medial  approach. At the conclusion of the procedure a ribbon shaped tissue marker clip was deployed into the biopsy cavity. Follow up 2 view mammogram was performed and dictated separately. IMPRESSION: Ultrasound guided biopsy of the right breast mass located the 12 o'clock position as discussed above. No apparent  complications. Electronically Signed   By: Altamese Cabal M.D.   On: 08/23/2015 17:15   [name] Rt Breast Bx W Loc Dev Ea Add Lesion Img Bx Spec US Guide  08/23/2015  CLINICAL DATA:  Right breast mass enlarged right axillary lymph nodes. EXAM: ULTRASOUND GUIDED RIGHT AXILLARY LYMPH NODE CORE NEEDLE BIOPSY COMPARISON:  Previous exam(s). FINDINGS: I met with the patient and we discussed the procedure of ultrasound-guided biopsy, including benefits and alternatives. We discussed the high likelihood of a successful procedure. We discussed the risks of the procedure, including infection, bleeding, tissue injury, clip migration, and inadequate sampling. Informed written consent was given. The usual time-out protocol was performed immediately prior to the procedure. Using sterile technique and 1% Lidocaine as local anesthetic, under direct ultrasound visualization, a 14 gauge spring-loaded device was used to perform biopsy of 1 of the enlarged right axillary lymph nodes using a lateral/inferior approach. At the conclusion of the procedure a spiral shaped HydroMARK tissue marker clip was deployed into the biopsy cavity. Follow up 2 view mammogram was performed and dictated separately. IMPRESSION: Ultrasound guided biopsy of enlarged right axillary lymph node as discussed above. No apparent complications. Electronically Signed   By: Altamese Cabal M.D.   On: 08/23/2015 17:17   Mr Rt Breast Bx Johnella Moloney Dev 1st Lesion Image Bx Spec Mr Guide  09/14/2015  ADDENDUM REPORT: 09/14/2015 15:20 ADDENDUM: Pathology revealed GRADE II INVASIVE AND IN SITU DUCTAL CARCINOMA of the inner Right breast. This was found to be concordant  by Dr. Franki Cabot. Pathology results were discussed with the patient by telephone. The patient reported doing well after the biopsy with tenderness at the site. Post biopsy instructions and care were reviewed and questions were answered. The patient was encouraged to call [facility] for any additional concerns. The patient has a recent diagnosis of additional right breast cancer, slightly superior and lateral to this biopsy site, and should follow her outlined treatment plan. Please note that the barbell shaped clip placed at the time of this biopsy is DISPLACED 10 MM ANTERIORLY by an associated post biopsy hematoma. This may affect preoperative localization if breast conservation therapy is desired. Pathology results reported by Terie Purser, RN on 09/14/2015. Electronically Signed   By: Franki Cabot M.D.   On: 09/14/2015 15:20  09/14/2015  CLINICAL DATA:  Recent biopsy-proven cancer within the upper right breast. Subsequent breast MRI showing an additional suspicious enhancing satellite mass located slightly medial and inferior to the biopsy-proven right breast cancer. Patient presents today for MRI-guided core biopsy of this additional satellite mass. EXAM: MRI GUIDED CORE NEEDLE BIOPSY OF THE RIGHT BREAST TECHNIQUE: Multiplanar, multisequence MR imaging of the right breast was performed both before and after administration of intravenous contrast. CONTRAST:  64m MULTIHANCE GADOBENATE DIMEGLUMINE 529 MG/ML IV SOLN COMPARISON:  Previous exams. FINDINGS: I met with the patient, and we discussed the procedure of MRI guided biopsy, including risks, benefits, and alternatives. Specifically, we discussed the risks of infection, bleeding, tissue injury, clip migration, and inadequate sampling. Informed, written consent was given. The usual time out protocol was performed immediately prior to the procedure. Using sterile technique, 1% Lidocaine, MRI guidance, and a 9 gauge vacuum assisted  device, biopsy was performed of the additional enhancing satellite mass in the right breast, located slightly medial and inferior to the biopsy-proven right breast cancer, using a medial approach. At the conclusion of the procedure, a barbell shaped tissue marker clip was deployed into the  biopsy cavity. Follow-up 2-view mammogram was performed and dictated separately. IMPRESSION: MRI guided biopsy of the additional satellite mass in the right breast, located slightly medial and inferior to the biopsy-proven cancer in the upper right breast. No apparent complications. Electronically Signed: By: Franki Cabot M.D. On: 09/13/2015 09:56    ASSESSMENT: 70 y.o. [place] woman status post right breast upper outer quadrant and right axillary lymph node biopsy 08/23/2015, both positive for a clinical T1 N2, stage IIIA invasive ductal carcinoma, grade 2, strongly estrogen receptor positive, 5% progesterone receptor positive, with an MIB-120-30% and no HER-2 amplification  (a) biopsy of a 0.9 cm Right breast satellite nodule scheduled for 09/13/2015  (1) left breast biopsy 08/24/2015 shows a complex sclerosing lesion  (2) neoadjuvant chemotherapy will consist of cyclophosphamide and docetaxel every 21 days 4, with OnPro support, starting 09/12/2015  (3) definitive surgery to follow, likely including full axillary lymph node dissection  (4) adjuvant radiation to follow surgery  (5) anti-estrogens for 10 years to follow at the completion of local treatment-- consider PALLAS trial  (6) genetics testing pending ([place]ken[place] descent)  PLAN: Jaynie was evaluated by Dr. Jana Hakim. Given her port rash, fever, and neutropenia, we are going to treat her with antibiotics in the outpatient setting. I have sent for cipro and a zpak to her pharmacy during this visit. This will likely rid her of any brewing UTI. The rash to her left port site could be due to the adhesive dressing used, so I am making a note in her chart to  use the sensitive "opsite" dressing. She is going to apply cortisione cream to the area, and will try benadryl and pepcid for the itching. I am also adding valacyclovir daily to her regimen as she still has some ulcers remaining despite magic mouthwash use. She will take a total of 1.32m (2 tabs) of ativan for sleep.  LLakeeshawill return on Wednesday to recheck her counts and reassess her rash. She is to proceed to the ED if her fever persists or the rash worsens before that visit. She understands and agrees with this plan. She knows the goal of treatment in her case is cure. She has been encouraged to call with any issues that might arise before her next visit here.   HLaurie Panda NP   09/19/2015 4:48 PM   ADDENDUM: LVaughan Bastahad a hard time with her first dose of chemotherapy, but the main issue today is a rash which is highly suspicious for port infection. She has a mild but persistent fever. She is moderately neutropenic.  LLakisais a very reliable patient who lives locally and is clinically stable. Accordingly we are proceeding with outpatient treatment. Initially we were going to do ciprofloxacin and Augmentin, but she has a history, however questionable, of penicillin allergy, so instead of Augmentin we will use Zithromax. I would like to see her again in 48 hours just to make sure the rash and fever are resolving or resolved.  I do not believe this is going to be related to her docetaxel or Cytoxan or her chemotherapy premeds, though these do include steroids.  LKimalaany problems that may develop before the next visit.   I personally saw this patient and performed a substantive portion of this encounter with the listed APP documented above.   MChauncey Cruel MD Medical Oncology and Hematology [facility]AOrting [place] [id number]. [phone]   Fax. [phone]

## 2015-09-21 ENCOUNTER — Other Ambulatory Visit (HOSPITAL_BASED_OUTPATIENT_CLINIC_OR_DEPARTMENT_OTHER): Payer: Medicare Other

## 2015-09-21 ENCOUNTER — Encounter: Payer: Self-pay | Admitting: Nurse Practitioner

## 2015-09-21 ENCOUNTER — Ambulatory Visit (HOSPITAL_BASED_OUTPATIENT_CLINIC_OR_DEPARTMENT_OTHER): Payer: Medicare Other | Admitting: Nurse Practitioner

## 2015-09-21 VITALS — BP 136/65 | HR 84 | Temp 98.6°F | Resp 18 | Wt 170.4 lb

## 2015-09-21 DIAGNOSIS — C50411 Malignant neoplasm of upper-outer quadrant of right female breast: Secondary | ICD-10-CM

## 2015-09-21 DIAGNOSIS — R21 Rash and other nonspecific skin eruption: Secondary | ICD-10-CM | POA: Diagnosis not present

## 2015-09-21 LAB — CBC WITH DIFFERENTIAL/PLATELET
BASO%: 0.4 % (ref 0.0–2.0)
BASOS ABS: 0.1 10*3/uL (ref 0.0–0.1)
EOS%: 0.2 % (ref 0.0–7.0)
Eosinophils Absolute: 0 10*3/uL (ref 0.0–0.5)
HEMATOCRIT: 38.9 % (ref 34.8–46.6)
HGB: 13.5 g/dL (ref 11.6–15.9)
LYMPH#: 2.4 10*3/uL (ref 0.9–3.3)
LYMPH%: 11 % — ABNORMAL LOW (ref 14.0–49.7)
MCH: 31 pg (ref 25.1–34.0)
MCHC: 34.7 g/dL (ref 31.5–36.0)
MCV: 89.4 fL (ref 79.5–101.0)
MONO#: 2.1 10*3/uL — AB (ref 0.1–0.9)
MONO%: 9.7 % (ref 0.0–14.0)
NEUT#: 17.4 10*3/uL — ABNORMAL HIGH (ref 1.5–6.5)
NEUT%: 78.7 % — AB (ref 38.4–76.8)
Platelets: 207 10*3/uL (ref 145–400)
RBC: 4.35 10*6/uL (ref 3.70–5.45)
RDW: 13.1 % (ref 11.2–14.5)
WBC: 22.1 10*3/uL — ABNORMAL HIGH (ref 3.9–10.3)

## 2015-09-21 LAB — COMPREHENSIVE METABOLIC PANEL
ALBUMIN: 3.2 g/dL — AB (ref 3.5–5.0)
ALK PHOS: 134 U/L (ref 40–150)
ALT: 42 U/L (ref 0–55)
ANION GAP: 11 meq/L (ref 3–11)
AST: 23 U/L (ref 5–34)
BUN: 8.4 mg/dL (ref 7.0–26.0)
CALCIUM: 8.9 mg/dL (ref 8.4–10.4)
CHLORIDE: 99 meq/L (ref 98–109)
CO2: 25 mEq/L (ref 22–29)
Creatinine: 0.9 mg/dL (ref 0.6–1.1)
EGFR: 61 mL/min/{1.73_m2} — ABNORMAL LOW (ref >90)
Glucose: 113 mg/dl (ref 70–140)
Potassium: 3.4 mEq/L — ABNORMAL LOW (ref 3.5–5.1)
Sodium: 135 mEq/L — ABNORMAL LOW (ref 136–145)
Total Bilirubin: <0.3 mg/dL (ref 0.20–1.20)
Total Protein: 6.7 g/dL (ref 6.4–8.3)

## 2015-09-21 MED ORDER — NYSTATIN 100000 UNIT/GM EX CREA
1.0000 | TOPICAL_CREAM | Freq: Two times a day (BID) | CUTANEOUS | Status: DC
Start: 2015-09-21 — End: 2015-10-31

## 2015-09-21 MED ORDER — FLUCONAZOLE 100 MG PO TABS: 100.0000 mg | ORAL_TABLET | Freq: Every day | ORAL | Status: DC

## 2015-09-21 NOTE — Progress Notes (Signed)
[name]  [phone] [phone]     ID: [name] DOB: 03-24-1946  MR#: [id number]  QQP#:[id number]  Patient Care Team: Seward Carol, MD as PCP - General (Internal Medicine) Chauncey Cruel, MD as Consulting Physician (Oncology) Fanny Skates, MD as Consulting Physician (General Surgery) Luberta Robertson, MD (Radiology) Hennie Duos, MD as Consulting Physician (Rheumatology) Domingo Pulse, MD (Urology) Druscilla Brownie, MD as Consulting Physician (Dermatology) PCP: Kandice Hams, MD GYN: OTHER MD:  CHIEF COMPLAINT: Locally advanced breast cancer  CURRENT TREATMENT: Neoadjuvant chemotherapy  BREAST CANCER HISTORY: From the original intake note:  [name] had routine mammographic screening in 08/18/2015 at the [facility], showing a right breast mass and possible axillary adenopathy. On 08/23/2015 she underwent bilateral diagnostic mammography with tomography and bilateral breast ultrasonography. The breast density was category B in the right breast there was a spiculated mass in the upper-outer quadrant measuring 1.2 cm and associated with pleomorphic calcifications. There were calcifications extending anteriorly and posteriorly from the mass worrisome for ductal carcinoma in situ. The total area in question is about 3 cm. There were also multiple enlarged right axillary lymph nodes. The mass was palpated as an area of fullness at the 12:00 position of the breast 3 cm from the nipple. There were palpable enlarged right axillary lymph nodes in the right axilla. Ultrasonography of the right breast confirmed an irregular hypoechoic mass in the area in question measuring 1.6 cm. There were multiple abnormal appearing enlarged right axillary lymph nodes, the largest measuring 2.3 cm.  In the left breast, mammography showed an area of architectural distortion associated with the left nipple. This was not palpable. Ultrasonography of the left  breast found no correlate. This area of the left breast was biopsied under tomography 08/24/2015. This showed (SAA [id number]) a complex sclerosing lesion and fibroadenoma.  On 08/23/2015 the patient underwent biopsy of the right breast mass in 1 of the abnormal right axillary lymph nodes. 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 Both biopsies were HER-2 nonamplified, the breast being ratio 1.27 and number per cell 1.90, and the lymph node 1 ratio 1.24 and number per cell 3.0.  On 08/26/2015 the patient underwent bilateral breast MRI. In the left breast there was no suspicious enhancement. The left axilla was benign. In the right breast there was a mass described as in the upper inner quadrant measuring 2.3 cm. Less than a centimeter away from this mass there was a satellite nodule measuring 0.9 cm. In the right axilla there were multiple enlarged right axillary lymph nodes, including a confluent region measuring up to 4 cm. There were also enlarged level II lymph nodes present measuring up to 2.1 cm.  The patient's subsequent history is as detailed below  INTERVAL HISTORY: [name] returns today for follow up of her breast cancer, accompanied by her husband, [name], and her daughter [name] via speakerphone. Today is day 10, cycle 1 of cyclophosphamide and docetaxel, with neulasta for granulocyte support. She continues on azithromycin and cipro that was prescribed on Monday for febrile neutropenia for a rash surround her port and neck. The rash is hardly better today and still pruritic, despite use of benadryl, atarax, triamcinolone, and hydrocortisone creams.   REVIEW OF SYSTEMS: [name] is no longer febrile. She denies nausea. Her bowel movements are back to normal. She still has  a few mouth sores, but they have improved since she was started on  valtrex daily. She is still fatigued. She complains of "acidic" discomfort while urinating and while having bowel movements. Her bone pain is improving. Her appetite is good. Her sleep is still off. A detailed review of systems is otherwise stable.   PAST MEDICAL HISTORY: Past Medical History  Diagnosis Date  . Osteoporosis   . Menopausal symptoms   . Chronic vaginitis   . Fibromyalgia   . Interstitial cystitis   . Thyroid disease     hypothyroid  . Hypoglycemia   . Asthma   . PONV (postoperative nausea and vomiting)   . Cancer [facility])     breast cancer  . Pneumonia   . Depression     PAST SURGICAL HISTORY: Past Surgical History  Procedure Laterality Date  . Nasal sinus surgery      X4  . Bladder hyperextension      X4  . Bunionectomy    . Leg surgery  06/2004    Broken tibia  . Cholecystectomy  01/2006  . Cesarean section      DR. INGRAM  . Mouth surgery    . Dental surgery      13 dental implants  . Colonoscopy    . Portacath placement Left 09/08/2015    Procedure: INSERTION OF PORT-A-CATH ;  Surgeon: Fanny Skates, MD;  Location: [facility] OR;  Service: General;  Laterality: Left;    FAMILY HISTORY Family History  Problem Relation Age of Onset  . Hypertension Mother   . Heart disease Mother   . Parkinsonism Father   . Heart disease Sister   . Cancer Sister     MELANOMA  . Breast cancer Sister     Age 74  The patient's father died at age 46 from complications of Parkinson's disease. The patient's mother died age 95 with heart disease. [name] had no brothers. She had 2 sisters. One sister was diagnosed with breast cancer at age 21. She was tested for the BRCA gene and was negative. The same sister also had melanoma diagnosed in her 35s. The patient has one maternal great aunt diagnosed with breast cancer around age 9. There is no history of ovarian cancer in the family  GYNECOLOGIC HISTORY:  No LMP recorded. Patient is postmenopausal. Menarche age 62, first live  birth age 80. The patient is GX P2. She stopped having periods in her early 48s and took hormone replacement until her breast cancer diagnosis, April 2017.  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 He has been in remission for more than 10 years. Their daughter [name] lives in [place] were she is Production designer, theatre/television/film of the [employer]. Daughter [name] is an infectious disease M.D. working and teaching at [employer] in [place]. The patient has one granddaughter, 109 months old as of May 2017. The family attends [employer]: Not in place   HEALTH MAINTENANCE: Social History  Substance Use Topics  . Smoking status: Former Smoker    Types: Cigarettes  . Smokeless tobacco: Never Used  . Alcohol Use: 0.0 oz/week    0 Standard drinks or equivalent per week     Comment: social     Colonoscopy: 2016/ [facility]  PAP: 2015  Bone density:  Lipid panel:  Allergies  Allergen Reactions  . Fluconazole     REACTION: elavated LFTs  . Nitrofurantoin Other (See Comments)    Unknown reaction  . Penicillins Other (See Comments)    Has patient had a PCN reaction causing immediate rash, facial/tongue/throat swelling, SOB or lightheadedness with hypotension: No Has patient had a PCN reaction causing severe rash involving mucus membranes or skin necrosis: No Has patient had a PCN reaction that required hospitalization No Has patient had a PCN reaction occurring within the last 10 years: No If all of the above answers are "NO", then may proceed with Cephalosporin use.  . Other Rash    Adhesive tape    Current Outpatient Prescriptions  Medication Sig Dispense Refill  . azithromycin (ZITHROMAX) 250 MG tablet Take as package instructs 6 each 0  . buPROPion (WELLBUTRIN XL)  300 MG 24 hr tablet Take 300 mg by mouth daily.      . calcitonin, salmon, (MIACALCIN/FORTICAL) 200 UNIT/ACT nasal spray Place 1 spray into alternate nostrils daily. 3.7 mL 11  . Calcium Carbonate-Vitamin D (CALCIUM + D PO) Take 1 tablet by mouth daily.     . ciprofloxacin (CIPRO) 500 MG tablet Take 1 tablet (500 mg total) by mouth 2 (two) times daily. 10 tablet 0  . clotrimazole-betamethasone (LOTRISONE) cream Apply topically 2 (two) times daily. (Patient taking differently: Apply 1 application topically as needed. Apply to affected area as needed) 45 g 3  . dexamethasone (DECADRON) 4 MG tablet Take 2 tablets (8 mg total) by mouth 2 (two) times daily. Start the day before Taxotere. Then again the day after chemo for 3 days. 30 tablet 1  . diazepam (VALIUM) 5 MG tablet Take 5 mg by mouth every 6 (six) hours as needed for anxiety.     . fluconazole (DIFLUCAN) 100 MG tablet Take 1 tablet (100 mg total) by mouth daily. 20 tablet 0  . hydrOXYzine (ATARAX/VISTARIL) 25 MG tablet Take 25 mg by mouth 3 (three) times daily as needed for anxiety or itching.    . Levothyroxine Sodium (SYNTHROID PO) Take 137 mcg by mouth daily.     Marland Kitchen lidocaine-prilocaine (EMLA) cream Apply to affected area once (Patient taking differently: Apply 1 application topically as needed. Apply to affected area once) 30 g 3  . LORazepam (ATIVAN) 0.5 MG tablet Take 1 tablet (0.5 mg total) by mouth at bedtime as needed (Nausea or vomiting). 30 tablet 0  . Multiple Vitamin (MULTIVITAMIN) capsule Take 1 capsule by mouth daily.      . nabumetone (RELAFEN) 750 MG tablet Take 750 mg by mouth 2 (two) times daily as needed for mild pain.    Marland Kitchen nystatin cream (MYCOSTATIN) Apply 1 application topically 2 (two) times daily. 30 g 0  . ondansetron (ZOFRAN) 8 MG tablet Take 1 tablet (8 mg total) by mouth 2 (two) times daily as needed for refractory nausea / vomiting. Start on day 3 after chemo. 30 tablet 1  . oxyCODONE-acetaminophen (PERCOCET) 7.5-325  MG tablet Take 1 tablet by mouth every 4 (four) hours as needed for severe pain. 30 tablet 0  . pantoprazole (PROTONIX) 40 MG tablet Take 40 mg by mouth daily.    . prochlorperazine (COMPAZINE) 10 MG tablet Take 1 tablet (10 mg total) by mouth every 6 (six) hours as needed (Nausea or vomiting). 30 tablet 1  . sulfamethoxazole-trimethoprim (BACTRIM DS,SEPTRA DS) 800-160 MG per tablet Take 1 tablet by mouth 2 (two) times daily. (Patient taking differently: Take 1  tablet by mouth as needed. ) 14 tablet 1  . terconazole (TERAZOL 7) 0.4 % vaginal cream Place 1 applicator vaginally at bedtime. (Patient taking differently: Place 1 applicator vaginally at bedtime as needed. ) 45 g 1  . tobramycin (TOBREX) 0.3 % ophthalmic solution Place 1-2 drops into the right eye 4 (four) times daily. X 5 days (Patient taking differently: Place 1-2 drops into the right eye 4 (four) times daily as needed. ) 5 mL 0  . Triamterene-HCTZ (DYAZIDE PO) Take 1 tablet by mouth daily.     Marland Kitchen triamterene-hydrochlorothiazide (MAXZIDE) 75-50 MG tablet Take 1 tablet by mouth daily.    . valACYclovir (VALTREX) 1000 MG tablet Take 1 tablet (1,000 mg total) by mouth daily. (Patient taking differently: Take 1,000 mg by mouth daily as needed. ) 30 tablet 1  . valACYclovir (VALTREX) 500 MG tablet Take 1 tablet (500 mg total) by mouth daily. 30 tablet 1  . Vitamin D, Ergocalciferol, (DRISDOL) 50000 UNITS CAPS capsule One tablet every other week (Patient taking differently: Take 50,000 Units by mouth every 14 (fourteen) days. One tablet every other week) 10 capsule 3  . zolpidem (AMBIEN) 10 MG tablet Take 10 mg by mouth at bedtime as needed for sleep.      No current facility-administered medications for this visit.    OBJECTIVE: Middle-aged white woman who appears stated age 70 Vitals:   09/21/15 1143  BP: 136/65  Pulse: 84  Temp: 98.6 F (37 C)  Resp: 18     Body mass index is 30.19 kg/(m^2).    ECOG FS:1 - Symptomatic but  completely ambulatory  Skin: erythema and tenderness 3 inches in circumference around port, mild erythema from clavicles up to neck HEENT: sclerae anicteric, conjunctivae pink. No thrush. Multiple lesions to right tongue and roof of mouth. Lymph Nodes: No cervical or supraclavicular lymphadenopathy  Lungs: clear to auscultation bilaterally, no rales, wheezes, or rhonci  Heart: regular rate and rhythm  Abdomen: round, soft, non tender, positive bowel sounds  Musculoskeletal: No focal spinal tenderness, no peripheral edema  Neuro: non focal, well oriented, positive affect  Breasts: large bruise surrounding right nipple. Healing bruises to left breast.   LAB RESULTS:  CMP     Component Value Date/Time   NA 135* 09/21/2015 1138   K 3.4* 09/21/2015 1138   CO2 25 09/21/2015 1138   GLUCOSE 113 09/21/2015 1138   BUN 8.4 09/21/2015 1138   CREATININE 0.9 09/21/2015 1138   CALCIUM 8.9 09/21/2015 1138   PROT 6.7 09/21/2015 1138   ALBUMIN 3.2* 09/21/2015 1138   AST 23 09/21/2015 1138   ALT 42 09/21/2015 1138   ALKPHOS 134 09/21/2015 1138   BILITOT <0.30 09/21/2015 1138    INo results found for: SPEP, UPEP  Lab Results  Component Value Date   WBC 22.1* 09/21/2015   NEUTROABS 17.4* 09/21/2015   HGB 13.5 09/21/2015   HCT 38.9 09/21/2015   MCV 89.4 09/21/2015   PLT 207 09/21/2015      Chemistry      Component Value Date/Time   NA 135* 09/21/2015 1138   K 3.4* 09/21/2015 1138   CO2 25 09/21/2015 1138   BUN 8.4 09/21/2015 1138   CREATININE 0.9 09/21/2015 1138      Component Value Date/Time   CALCIUM 8.9 09/21/2015 1138   ALKPHOS 134 09/21/2015 1138   AST 23 09/21/2015 1138   ALT 42 09/21/2015 1138   BILITOT <0.30 09/21/2015 1138       No  results found for: LABCA2  No components found for: LABCA125  No results for input(s): INR in the last 168 hours.  Urinalysis No results found for: COLORURINE, APPEARANCEUR, LABSPEC, PHURINE, GLUCOSEU, HGBUR, BILIRUBINUR, KETONESUR,  PROTEINUR, UROBILINOGEN, NITRITE, LEUKOCYTESUR    ELIGIBLE FOR AVAILABLE RESEARCH PROTOCOL: no  STUDIES: Ct Chest W Contrast  09/06/2015  CLINICAL DATA:  New diagnosis RIGHT breast cancer. Initial evaluation EXAM: CT CHEST WITH CONTRAST TECHNIQUE: Multidetector CT imaging of the chest was performed during intravenous contrast administration. CONTRAST:  75 mL Isovue COMPARISON:  None. FINDINGS: Mediastinum/Nodes: Multiple enlarged RIGHT axillary lymph nodes. Example lymph node measures 17 mm short axis on image 23, series 2. Lymph node beneath the RIGHT sub pectoralis muscle measures 13 mm short axis on 20, series 2. No supraclavicular or infraclavicular adenopathy present. No internal mammary adenopathy.  No mediastinal adenopathy. No central pulmonary embolism. Coronary calcifications. Esophagus normal. Lungs/Pleura: Multiple scattered noncalcified pulmonary nodules which are less than 5 mm throughout the lungs. For example 3 mm RIGHT upper lobe nodule on image 30, series 5. 3 mm nodule on image 27, series 5. 4 mm LEFT upper lobe nodule image 30, series 5. These nodules have an upper lobe predominance. Several ringed nodules present in the LEFT lower lobe (image 121, series 5 example measuring 4 mm). Upper abdomen: Postcholecystectomy. Adrenal glands are normal. Upper poles of the kidneys are normal. No upper abdominal adenopathy. Musculoskeletal: No aggressive osseous lesion. IMPRESSION: 1. Metastatic adenopathy to the RIGHT axilla and sub pectoralis nodal stations. 2. No central metastatic adenopathy. 3. Multiple sub 5 mm noncalcified nodules predominantly within the upper lobes but also within the lower lobes. Common differential includes noncalcified granulomatous disease versus metastatic disease. The uniformity of the nodular size favors a non metastatic process. These nodules are too small to be evaluated by FDG PET scan. Electronically Signed   By: Suzy Bouchard M.D.   On: 09/06/2015 09:00   Nm  Bone Scan Whole Body  09/06/2015  CLINICAL DATA:  Breast cancer staging ; the patient reports lumbar back pain and history of a tibial fracture, no recent trauma. EXAM: NUCLEAR MEDICINE WHOLE BODY BONE SCAN TECHNIQUE: Whole body anterior and posterior images were obtained approximately 3 hours after intravenous injection of radiopharmaceutical. RADIOPHARMACEUTICALS:  26.0 mCi Technetium-46mMDP IV COMPARISON:  None in PACs FINDINGS: There is adequate uptake of the radiopharmaceutical by the skeleton. There is adequate soft tissue clearance and renal activity. Uptake within the calvarium is normal. Mildly increased uptake posteriorly in the mid cervical spine is compatible with degenerative change. Minimal increased uptake at approximately T3 and T10 posteriorly is consistent with degenerative change. Mildly increased uptake at approximately L4 is consistent with degenerative change. Uptake within the ribs is normal. There is focally increased uptake in the first carpometacarpal joint on the left. Uptake within the pelvis and thighs and legs is normal. Mildly increased uptake in the midfoot on the right is present. IMPRESSION: No uptake demonstrated to suggest metastatic disease. Areas of increased uptake described above are most compatible with degenerative change. Electronically Signed   By: David  JMartiniqueM.D.   On: 09/06/2015 11:20   Mr Breast Bilateral W Wo Contrast  08/29/2015  ADDENDUM REPORT: 08/29/2015 15:18 ADDENDUM: Corrected report: Lymph nodes: Multiple enlarged right axillary lymph nodes are identified. The largest mass measured is likely a confluence of multiple nodes and measures approximately 4.0 cm. Per discussion with Dr. IDalbert Batman patient will be scheduled for MR guided biopsy and clip placement of the satellite  nodule within the right breast. Electronically Signed   By: Nolon Nations M.D.   On: 08/29/2015 15:18  08/29/2015  CLINICAL DATA:  Recent diagnosis of invasive and in situ ductal  carcinoma following a biopsy of the right breast 12 o'clock location. Biopsy of enlarged right axillary lymph node demonstrated metastatic carcinoma. Stereotactic guided core biopsy of distortion in the 1 o'clock location shows a complex sclerosing lesion without atypia. LABS:  Creatinine was obtained on site at [place] at [address]Results: Creatinine 1.0 mg/dL. EXAM: BILATERAL BREAST MRI WITH AND WITHOUT CONTRAST TECHNIQUE: Multiplanar, multisequence MR images of both breasts were obtained prior to and following the intravenous administration of 15 ml of MultiHance. THREE-DIMENSIONAL MR IMAGE RENDERING ON INDEPENDENT WORKSTATION: Three-dimensional MR images were rendered by post-processing of the original MR data on an independent workstation. The three-dimensional MR images were interpreted, and findings are reported in the following complete MRI report for this study. Three dimensional images were evaluated at the independent DynaCad workstation COMPARISON:  Recent mammogram and ultrasound exams from the [facility] dated 08/24/2015 and earlier FINDINGS: Breast composition: b. Scattered fibroglandular tissue. Background parenchymal enhancement: Minimal Right breast: There is an irregular mass within the upper inner quadrant of the right breast measuring 2.3 x 2.3 x 1.2 cm appear within the mass there is tissue marker clip from recent ultrasound-guided core biopsy which demonstrated invasive ductal carcinoma and ductal carcinoma in situ. 5 mm medial and inferior to this mass, there is a small irregular enhancing mass which measures 7 x 9 mm. Given the irregularity and appearance, this is favored to represent a small satellite nodule. Enhancement within both areas is rapid wash-in and washout type kinetics. No other suspicious abnormalities are identified within the right breast. Left breast: There is tissue marker clip artifact within the upper-outer quadrant of the  left breast following a recent stereotactic guided core biopsy which demonstrated complex sclerosing lesion. No suspicious enhancement is identified in the left breast. Excision has been recommended of this lesion. Lymph nodes: Multiple enlarged right axillary lymph nodes are identified. The largest is likely ache and fluid region of multiple nodes measuring approximately 4.0 cm. Enlarged level 2 lymph nodes are present, measuring up to 2.1 cm. Left axilla and internal mammary chain are negative for adenopathy. Ancillary findings:  None. IMPRESSION: 1. Known malignancy measuring 2.3 x 2.3 cm in the upper inner quadrant of the right breast. 2. Small adjacent satellite nodule along the lower medial aspect of the known malignancy measuring 7 x 9 mm. 3. Multiple large right axillary lymph nodes including level 2 nodes. Findings are consistent with known metastatic disease. 4. Expected post biopsy changes in the left breast without significant enhancement following biopsy demonstrating complex sclerosing lesion. RECOMMENDATION: Treatment plan for known right breast cancer. BI-RADS CATEGORY  6: Known biopsy-proven malignancy. Electronically Signed: By: Nolon Nations M.D. On: 08/29/2015 11:44   Dg Chest Port 1 View  09/08/2015  CLINICAL DATA:  Port-A-Cath placement EXAM: PORTABLE CHEST 1 VIEW COMPARISON:  07/31/2011 FINDINGS: Borderline cardiomegaly. Mild hyperinflation again noted. No acute infiltrate or pulmonary edema. There is left subclavian Port-A-Cath with tip in distal SVC. No pneumothorax. IMPRESSION: Mild hyperinflation. No active disease. Left subclavian Port-A-Cath in place. No pneumothorax. Electronically Signed   By: Lahoma Crocker M.D.   On: 09/08/2015 16:00   Mm Digital Diagnostic Unilat R  09/13/2015  CLINICAL DATA:  Status post MRI-guided biopsy of a suspicious enhancing mass in the right breast  earlier today. Recently diagnosed right breast cancer within the upper inner quadrant. Subsequent MRI  showed an additional suspicious enhancing satellite mass within the inner right breast, slightly medial and inferior to the biopsy-proven cancer. The additional satellite mass was biopsied today. EXAM: DIAGNOSTIC RIGHT MAMMOGRAM POST MRI BIOPSY COMPARISON:  Previous exam(s). FINDINGS: Mammographic images were obtained following MRI guided biopsy of the right breast mass located slightly medial and inferior to patient's biopsy-proven cancer. At the conclusion of this procedure, a barbell shaped tissue marker was deployed in the biopsy cavity. This clip has been displaced approximately 10 mm anteriorly by a post biopsy hematoma. IMPRESSION: Postprocedure mammogram for clip placement. BARBELL SHAPED CLIP placed today after biopsy of the satellite mass located slightly medial and inferior to the biopsy-proven right breast cancer. Today's CLIP IS DISPLACED 10 MM ANTERIORLY by an associated post biopsy hematoma. Ribbon shaped clip within the upper right breast was placed at the time of the earlier biopsy, denoting the site of patient's known biopsy-proven cancer. Final Assessment: Post Procedure Mammograms for Marker Placement Electronically Signed   By: Franki Cabot M.D.   On: 09/13/2015 09:35   Mm Digital Diagnostic Unilat R  08/23/2015  CLINICAL DATA:  Post right breast ultrasound-guided biopsy. EXAM: DIAGNOSTIC RIGHT MAMMOGRAM POST ULTRASOUND BIOPSY COMPARISON:  Previous exam(s). FINDINGS: Mammographic images were obtained following ultrasound guided biopsy of the spiculated mass located within the right breast at 12 o'clock position an enlarged right axillary lymph node. The ribbon shaped clip is in appropriate position associated with spiculated mass located within the right breast at 12 o'clock position. Spiral shaped HydroMARK clip is in appropriate position associated with an enlarged right axillary lymph node as seen in the right MLO projection. IMPRESSION: Appropriate positioning of clips following right  breast and right axillary lymph node ultrasound-guided core biopsies. Final Assessment: Post Procedure Mammograms for Marker Placement Electronically Signed   By: Altamese Cabal M.D.   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 Patient returns for results following left breast tomosynthesis guided biopsy. Current Pain Level: 3 HISTORY OF PRESENT ILLNESS: Diagnostic evaluation demonstrated a spiculated mass with calcifications located superiorly within the right breast with abnormal appearing right axillary lymph nodes. Right breast and axillary lymph node ultrasound-guided core biopsies were performed on 08/23/2015. An additional area of subtle distortion was noted within the left breast and tomosynthesis guided biopsy of the left breast was performed on 08/24/2015. The patient returns for results. EXAM: There is moderate ecchymosis present bilaterally related to the patient's bilateral ultrasound-guided biopsies. There is no hematoma formation or signs of infection. PATHOLOGY: The pathology associated with the left breast tomosynthesis guided biopsy of the subtle area of distortion located within the anterior left breast at the 1 o'clock position demonstrated a complex sclerosing lesion. Pathology is concordant with the imaging findings. ASSESSMENT AND PLAN: ASSESSMENT AND PLAN 1. Complex sclerosing lesion associated with the subtle area of distortion located within the left breast 1 o'clock position. Excision is recommended. 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 The patient is scheduled to see Dr.Magrinat on 08/31/2015. The patient has been rescheduled to see Dr. Dalbert Batman for surgical consultation on 08/26/2015. Post biopsy wound care  instructions were reviewed with the patient. The patient was encouraged to call  our breast center for additional questions or concerns. Electronically Signed   By: Altamese Cabal M.D.   On: 08/25/2015 14:43   Mm Radiologist Eval And Mgmt  08/24/2015  EXAM: ESTABLISHED PATIENT OFFICE VISIT - LEVEL II CHIEF COMPLAINT: Patient returns for results following right breast and axillary lymph node ultrasound-guided core biopsies. Current Pain Level: 3 HISTORY OF PRESENT ILLNESS: The patient noted a of palpable mass within the right breast. Evaluation demonstrated a mass located within the right breast superiorly and right axillary adenopathy. Also, there is possible distortion located within the left breast 1 o'clock position. Patient underwent right breast and right axillary lymph node ultrasound-guided core biopsies on 08/23/2015 and returns for results. EXAM: There is mild ecchymosis associated with the right breast and right axillary lymph node ultrasound-guided core biopsy sites. There is no hematoma formation and no signs of infection. PATHOLOGY: The pathology demonstrated invasive ductal carcinoma with DCIS and a metastatic right axillary lymph node. The pathology is concordant with the imaging findings. I have discussed the findings with the patient and her husband and answered their questions. 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 Plan is to perform left breast tomosynthesis guided biopsy 08/24/2015 of an area of possible subtle distortion located within the left breast at the 1 o'clock position. Patient is scheduled for Medical Oncology consultation with Dr. Jana Hakim on 08/31/2015. The patient is scheduled see Dr. Fanny Skates for surgical consultation on 09/08/2015. Post biopsy wound care instructions were reviewed with the patient. Electronically Signed   By: Altamese Cabal M.D.   On: 08/24/2015 14:14   Dg  Fluoro Guide Cv Line-no Report  09/08/2015  CLINICAL DATA:  FLOURO GUIDE CV LINE Fluoroscopy was utilized by the requesting physician.  No radiographic interpretation.   US Breast Ltd Uni Left Inc Axilla  08/23/2015  CLINICAL DATA:  Recall from screening mammography. EXAM: 2D DIGITAL DIAGNOSTIC BILATERAL MAMMOGRAM WITH CAD AND ADJUNCT TOMO ULTRASOUND BILATERAL BREAST COMPARISON:  08/11/2015, 01/15/2012, 01/09/2011. ACR Breast Density Category b: There are scattered areas of fibroglandular density. FINDINGS: Additional views of the right breast demonstrate a spiculated mass to be present located within the right breast at the 11:30 to 12 o'clock position with associated pleomorphic calcifications within the mass. The mass measures 1.2 cm by mammography. In addition, there are pleomorphic calcifications extending anteriorly and posteriorly from the mass which are worrisome for possible DCIS. The mass and calcifications span approximately 3 cm. In addition, there are multiple enlarged right axillary lymph nodes present. Additional tomosynthesis views of the left breast demonstrate subtle possible architectural distortion located slightly lateral and superior to the left nipple within the anterior 1/3 of the breast. There is a small adjacent oval circumscribed nodule present which has been stable when compared with mammograms dating back to 2007. Mammographic images were processed with CAD. On physical exam, there is a fullness present within the right breast at 12 o'clock position 3 cm from the nipple. There are palpable enlarged right axillary lymph nodes present within the low right axilla. There is no palpable abnormality within the left breast. Targeted ultrasound is performed, showing an irregularly marginated hypoechoic mass with shadowing located within the right breast at the 12 o'clock position 3 cm from the nipple measuring 1.6 x 1.2 x 1.2 cm in size. This is worrisome for invasive mammary carcinoma. In  addition, there are multiple abnormal appearing enlarged right axillary lymph nodes present with loss of  normal hilar fat and increased vascularity. The largest lymph node measures 2.3 x 2.3 x 1.3 cm in size. Ultrasound-guided core biopsy of the right breast mass and 1 of the enlarged right axillary lymph node is recommended. This will be performed today and reported separately. Ultrasound of the left breast in the area of questioned distortion noted on the recent mammogram demonstrates no definite ultrasound correlate. As a result, tomosynthesis guided biopsy of the possible subtle distortion within the left breast is recommended and will be scheduled. IMPRESSION: 1.Spiculated mass with associated worrisome calcifications located within the right breast the 12 o'clock position worrisome for invasive mammary carcinoma and adjacent DCIS. This is associated with multiple enlarged right axillary lymph nodes. 2. Question subtle persistent distortion located slightly lateral and superior to the left nipple within the anterior 1/3 of the left breast. Tomosynthesis guided biopsy is recommended and will be scheduled. RECOMMENDATION: 1. Right breast ultrasound-guided core biopsy at the 12 o'clock position. 2. Right axillary lymph node ultrasound-guided core biopsy. 3. Left breast tomosynthesis guided biopsy of questionable subtle distortion. I have discussed the findings and recommendations with the patient. Results were also provided in writing at the conclusion of the visit. If applicable, a reminder letter will be sent to the patient regarding the next appointment. BI-RADS CATEGORY  5: Highly suggestive of malignancy. Electronically Signed   By: Altamese Cabal M.D.   On: 08/23/2015 17:08   US Breast Ltd Uni Right Inc Axilla  08/23/2015  CLINICAL DATA:  Recall from screening mammography. EXAM: 2D DIGITAL DIAGNOSTIC BILATERAL MAMMOGRAM WITH CAD AND ADJUNCT TOMO ULTRASOUND BILATERAL BREAST COMPARISON:  08/11/2015,  01/15/2012, 01/09/2011. ACR Breast Density Category b: There are scattered areas of fibroglandular density. FINDINGS: Additional views of the right breast demonstrate a spiculated mass to be present located within the right breast at the 11:30 to 12 o'clock position with associated pleomorphic calcifications within the mass. The mass measures 1.2 cm by mammography. In addition, there are pleomorphic calcifications extending anteriorly and posteriorly from the mass which are worrisome for possible DCIS. The mass and calcifications span approximately 3 cm. In addition, there are multiple enlarged right axillary lymph nodes present. Additional tomosynthesis views of the left breast demonstrate subtle possible architectural distortion located slightly lateral and superior to the left nipple within the anterior 1/3 of the breast. There is a small adjacent oval circumscribed nodule present which has been stable when compared with mammograms dating back to 2007. Mammographic images were processed with CAD. On physical exam, there is a fullness present within the right breast at 12 o'clock position 3 cm from the nipple. There are palpable enlarged right axillary lymph nodes present within the low right axilla. There is no palpable abnormality within the left breast. Targeted ultrasound is performed, showing an irregularly marginated hypoechoic mass with shadowing located within the right breast at the 12 o'clock position 3 cm from the nipple measuring 1.6 x 1.2 x 1.2 cm in size. This is worrisome for invasive mammary carcinoma. In addition, there are multiple abnormal appearing enlarged right axillary lymph nodes present with loss of normal hilar fat and increased vascularity. The largest lymph node measures 2.3 x 2.3 x 1.3 cm in size. Ultrasound-guided core biopsy of the right breast mass and 1 of the enlarged right axillary lymph node is recommended. This will be performed today and reported separately. Ultrasound of the  left breast in the area of questioned distortion noted on the recent mammogram demonstrates no definite ultrasound correlate. As a result,  tomosynthesis guided biopsy of the possible subtle distortion within the left breast is recommended and will be scheduled. IMPRESSION: 1.Spiculated mass with associated worrisome calcifications located within the right breast the 12 o'clock position worrisome for invasive mammary carcinoma and adjacent DCIS. This is associated with multiple enlarged right axillary lymph nodes. 2. Question subtle persistent distortion located slightly lateral and superior to the left nipple within the anterior 1/3 of the left breast. Tomosynthesis guided biopsy is recommended and will be scheduled. RECOMMENDATION: 1. Right breast ultrasound-guided core biopsy at the 12 o'clock position. 2. Right axillary lymph node ultrasound-guided core biopsy. 3. Left breast tomosynthesis guided biopsy of questionable subtle distortion. I have discussed the findings and recommendations with the patient. Results were also provided in writing at the conclusion of the visit. If applicable, a reminder letter will be sent to the patient regarding the next appointment. BI-RADS CATEGORY  5: Highly suggestive of malignancy. Electronically Signed   By: Altamese Cabal M.D.   On: 08/23/2015 17:08   Mm Diag Breast Tomo Uni Left  08/24/2015  CLINICAL DATA:  Post left breast tomosynthesis guided biopsy. EXAM: DIAGNOSTIC LEFT MAMMOGRAM POST TOMOSYNTHESIS GUIDED BIOPSY COMPARISON:  Previous exam(s). FINDINGS: Mammographic images were obtained following tomosynthesis guided biopsy of a subtle area of possible distortion located within the left breast at the 1 o'clock position. The X shaped clip appears in appropriate position. IMPRESSION: Appropriate position of clip following left breast tomosynthesis guided biopsy. Final Assessment: Post Procedure Mammograms for Marker Placement Electronically Signed   By: Altamese Cabal M.D.   On: 08/24/2015 14:09   Mm Diag Breast Tomo Bilateral  08/23/2015  CLINICAL DATA:  Recall from screening mammography. EXAM: 2D DIGITAL DIAGNOSTIC BILATERAL MAMMOGRAM WITH CAD AND ADJUNCT TOMO ULTRASOUND BILATERAL BREAST COMPARISON:  08/11/2015, 01/15/2012, 01/09/2011. ACR Breast Density Category b: There are scattered areas of fibroglandular density. FINDINGS: Additional views of the right breast demonstrate a spiculated mass to be present located within the right breast at the 11:30 to 12 o'clock position with associated pleomorphic calcifications within the mass. The mass measures 1.2 cm by mammography. In addition, there are pleomorphic calcifications extending anteriorly and posteriorly from the mass which are worrisome for possible DCIS. The mass and calcifications span approximately 3 cm. In addition, there are multiple enlarged right axillary lymph nodes present. Additional tomosynthesis views of the left breast demonstrate subtle possible architectural distortion located slightly lateral and superior to the left nipple within the anterior 1/3 of the breast. There is a small adjacent oval circumscribed nodule present which has been stable when compared with mammograms dating back to 2007. Mammographic images were processed with CAD. On physical exam, there is a fullness present within the right breast at 12 o'clock position 3 cm from the nipple. There are palpable enlarged right axillary lymph nodes present within the low right axilla. There is no palpable abnormality within the left breast. Targeted ultrasound is performed, showing an irregularly marginated hypoechoic mass with shadowing located within the right breast at the 12 o'clock position 3 cm from the nipple measuring 1.6 x 1.2 x 1.2 cm in size. This is worrisome for invasive mammary carcinoma. In addition, there are multiple abnormal appearing enlarged right axillary lymph nodes present with loss of normal hilar fat and increased  vascularity. The largest lymph node measures 2.3 x 2.3 x 1.3 cm in size. Ultrasound-guided core biopsy of the right breast mass and 1 of the enlarged right axillary lymph node is recommended. This will be performed today  and reported separately. Ultrasound of the left breast in the area of questioned distortion noted on the recent mammogram demonstrates no definite ultrasound correlate. As a result, tomosynthesis guided biopsy of the possible subtle distortion within the left breast is recommended and will be scheduled. IMPRESSION: 1.Spiculated mass with associated worrisome calcifications located within the right breast the 12 o'clock position worrisome for invasive mammary carcinoma and adjacent DCIS. This is associated with multiple enlarged right axillary lymph nodes. 2. Question subtle persistent distortion located slightly lateral and superior to the left nipple within the anterior 1/3 of the left breast. Tomosynthesis guided biopsy is recommended and will be scheduled. RECOMMENDATION: 1. Right breast ultrasound-guided core biopsy at the 12 o'clock position. 2. Right axillary lymph node ultrasound-guided core biopsy. 3. Left breast tomosynthesis guided biopsy of questionable subtle distortion. I have discussed the findings and recommendations with the patient. Results were also provided in writing at the conclusion of the visit. If applicable, a reminder letter will be sent to the patient regarding the next appointment. BI-RADS CATEGORY  5: Highly suggestive of malignancy. Electronically Signed   By: Altamese Cabal M.D.   On: 08/23/2015 17:08   Mm Lt Breast Bx W Loc Dev 1st Lesion Image Bx Spec Stereo Guide  08/24/2015  CLINICAL DATA:  Recently diagnosed right breast invasive ductal carcinoma with DCIS and metastatic right axillary lymph node. Area of subtle possible distortion located within the left breast anteriorly at the 1 o'clock position. Biopsy requested. EXAM: LEFT BREAST TOMOSYNTHESIS GUIDED  CORE NEEDLE BIOPSY COMPARISON:  Previous exams. FINDINGS: The patient and I discussed the procedure of stereotactic-guided biopsy including benefits and alternatives. We discussed the high likelihood of a successful procedure. We discussed the risks of the procedure including infection, bleeding, tissue injury, clip migration, and inadequate sampling. Informed written consent was given. The usual time out protocol was performed immediately prior to the procedure. Using sterile technique and 1% Lidocaine as local anesthetic, under stereotactic guidance, a 9 gauge vacuum assisted device was used to perform core needle biopsy of the area of subtle distortion located within the anterior portion of the left breast at the 1 o'clock position using a superior craniocaudal approach. No specimen mammogram was obtained. At the conclusion of the procedure, an X shaped tissue marker clip was deployed into the biopsy cavity. Follow-up 2-view mammogram was performed and dictated separately. IMPRESSION: Tomosynthesis guided biopsy of subtle possible left breast distortion. No apparent complications. Electronically Signed   By: Altamese Cabal M.D.   On: 08/24/2015 14:07   [name] Rt Breast Bx W Loc Dev 1st Lesion Img Bx Spec US Guide  08/23/2015  CLINICAL DATA:  Right breast mass and enlarged right axillary lymph nodes. EXAM: ULTRASOUND GUIDED RIGHT BREAST CORE NEEDLE BIOPSY COMPARISON:  Previous exam(s). FINDINGS: I met with the patient and we discussed the procedure of ultrasound-guided biopsy, including benefits and alternatives. We discussed the high likelihood of a successful procedure. We discussed the risks of the procedure, including infection, bleeding, tissue injury, clip migration, and inadequate sampling. Informed written consent was given. The usual time-out protocol was performed immediately prior to the procedure. Using sterile technique and 1% Lidocaine as local anesthetic, under direct ultrasound visualization, a  14 gauge spring-loaded device was used to perform biopsy of the spiculated mass located within the right breast at the 12 o'clock position using a medial approach. At the conclusion of the procedure a ribbon shaped tissue marker clip was deployed into the biopsy cavity. Follow up 2 view  mammogram was performed and dictated separately. IMPRESSION: Ultrasound guided biopsy of the right breast mass located the 12 o'clock position as discussed above. No apparent complications. Electronically Signed   By: Rolla Plate M.D.   On: 08/23/2015 17:15   [name] Rt Breast Bx W Loc Dev Ea Add Lesion Img Bx Spec US Guide  08/23/2015  CLINICAL DATA:  Right breast mass enlarged right axillary lymph nodes. EXAM: ULTRASOUND GUIDED RIGHT AXILLARY LYMPH NODE CORE NEEDLE BIOPSY COMPARISON:  Previous exam(s). FINDINGS: I met with the patient and we discussed the procedure of ultrasound-guided biopsy, including benefits and alternatives. We discussed the high likelihood of a successful procedure. We discussed the risks of the procedure, including infection, bleeding, tissue injury, clip migration, and inadequate sampling. Informed written consent was given. The usual time-out protocol was performed immediately prior to the procedure. Using sterile technique and 1% Lidocaine as local anesthetic, under direct ultrasound visualization, a 14 gauge spring-loaded device was used to perform biopsy of 1 of the enlarged right axillary lymph nodes using a lateral/inferior approach. At the conclusion of the procedure a spiral shaped HydroMARK tissue marker clip was deployed into the biopsy cavity. Follow up 2 view mammogram was performed and dictated separately. IMPRESSION: Ultrasound guided biopsy of enlarged right axillary lymph node as discussed above. No apparent complications. Electronically Signed   By: Rolla Plate M.D.   On: 08/23/2015 17:17   Mr Rt Breast Bx [place] Dev 1st Lesion Image Bx Spec Mr Guide  09/14/2015  ADDENDUM  REPORT: 09/14/2015 15:20 ADDENDUM: Pathology revealed GRADE II INVASIVE AND IN SITU DUCTAL CARCINOMA of the inner Right breast. This was found to be concordant by Dr. Bary Richard. Pathology results were discussed with the patient by telephone. The patient reported doing well after the biopsy with tenderness at the site. Post biopsy instructions and care were reviewed and questions were answered. The patient was encouraged to call [facility] for any additional concerns. The patient has a recent diagnosis of additional right breast cancer, slightly superior and lateral to this biopsy site, and should follow her outlined treatment plan. Please note that the barbell shaped clip placed at the time of this biopsy is DISPLACED 10 MM ANTERIORLY by an associated post biopsy hematoma. This may affect preoperative localization if breast conservation therapy is desired. Pathology results reported by Rene Kocher, RN on 09/14/2015. Electronically Signed   By: Bary Richard M.D.   On: 09/14/2015 15:20  09/14/2015  CLINICAL DATA:  Recent biopsy-proven cancer within the upper right breast. Subsequent breast MRI showing an additional suspicious enhancing satellite mass located slightly medial and inferior to the biopsy-proven right breast cancer. Patient presents today for MRI-guided core biopsy of this additional satellite mass. EXAM: MRI GUIDED CORE NEEDLE BIOPSY OF THE RIGHT BREAST TECHNIQUE: Multiplanar, multisequence MR imaging of the right breast was performed both before and after administration of intravenous contrast. CONTRAST:  40mL MULTIHANCE GADOBENATE DIMEGLUMINE 529 MG/ML IV SOLN COMPARISON:  Previous exams. FINDINGS: I met with the patient, and we discussed the procedure of MRI guided biopsy, including risks, benefits, and alternatives. Specifically, we discussed the risks of infection, bleeding, tissue injury, clip migration, and inadequate sampling. Informed, written consent was given.  The usual time out protocol was performed immediately prior to the procedure. Using sterile technique, 1% Lidocaine, MRI guidance, and a 9 gauge vacuum assisted device, biopsy was performed of the additional enhancing satellite mass in the right breast, located slightly medial and inferior to  the biopsy-proven right breast cancer, using a medial approach. At the conclusion of the procedure, a barbell shaped tissue marker clip was deployed into the biopsy cavity. Follow-up 2-view mammogram was performed and dictated separately. IMPRESSION: MRI guided biopsy of the additional satellite mass in the right breast, located slightly medial and inferior to the biopsy-proven cancer in the upper right breast. No apparent complications. Electronically Signed: By: Franki Cabot M.D. On: 09/13/2015 09:56    ASSESSMENT: 70 y.o. [place] woman status post right breast upper outer quadrant and right axillary lymph node biopsy 08/23/2015, both positive for a clinical T1 N2, stage IIIA invasive ductal carcinoma, grade 2, strongly estrogen receptor positive, 5% progesterone receptor positive, with an MIB-120-30% and no HER-2 amplification  (a) biopsy of a 0.9 cm Right breast satellite nodule scheduled for 09/13/2015  (1) left breast biopsy 08/24/2015 shows a complex sclerosing lesion  (2) neoadjuvant chemotherapy will consist of cyclophosphamide and docetaxel every 21 days 4, with OnPro support, starting 09/12/2015  (3) definitive surgery to follow, likely including full axillary lymph node dissection  (4) adjuvant radiation to follow surgery  (5) anti-estrogens for 10 years to follow at the completion of local treatment-- consider PALLAS trial  (6) genetics testing pending ([place]kenazi descent)  PLAN: Dr. Jana Hakim evaluated Aaren's chest rash himself. He would like to try systemic antifungal treatment, so we are starting her on 19m fluconazole daily. The patient will "drop in" on Friday to see if this area has  improved. I am adding a topical nystatin cream to apply to the rash for relief from the pruritus, as her other creams, benadryl, and atarax have not been helpful. If not, she may need to be referred back to Dr. IDalbert Batmanfor evaluation of a port infection. She intends to finish out her course of antibiotics. The labs were reviewed in detail and her [place]rossis much improved.    LBriannieis scheduled for cycle 2 of treatment in 2 weeks. She understands and agrees with this plan. She knows the goal of treatment in her case is cure. She has been encouraged to call with any issues that might arise before her next visit here.   HLaurie Panda NP

## 2015-09-23 MED ORDER — KETOCONAZOLE 2 % EX CREA: 1.0000 "application " | TOPICAL_CREAM | Freq: Every day | CUTANEOUS | Status: DC

## 2015-09-23 NOTE — Progress Notes (Signed)
09/23/2015.  Her Rash Is Perhaps a Little Less Prominent but It Is Slightly More Widespread Not Involving Her Upper Back As Well. I Still Think This Is Most Consistent with a Superficial Fungal Infection. She Has Been on Diflucan Now for 3 Days. I Am Going to Add Ketoconazole Cream.  She's, Call Me on Tuesday, May 30 A.M., and If the Rash Is Not Pretty Much Gone We Are Going to Switch toa Medrol Dosepak. She Has My Number to Call If Any Other Problems Develop

## 2015-09-23 NOTE — Addendum Note (Signed)
Addended by: Chauncey Cruel on: 09/23/2015 02:55 PM   Modules accepted: Orders

## 2015-09-28 ENCOUNTER — Other Ambulatory Visit: Payer: Self-pay | Admitting: *Deleted

## 2015-09-28 DIAGNOSIS — C50411 Malignant neoplasm of upper-outer quadrant of right female breast: Secondary | ICD-10-CM

## 2015-09-28 MED ORDER — DEXAMETHASONE 4 MG PO TABS: 8.0000 mg | ORAL_TABLET | Freq: Two times a day (BID) | ORAL | Status: DC

## 2015-09-28 MED ORDER — METHYLPREDNISOLONE 4 MG PO TBPK: ORAL_TABLET | ORAL | Status: DC

## 2015-09-28 NOTE — Telephone Encounter (Signed)
Received call from patient stating she still has a rash and it is itching and keeping her up at night.  Per Dr. Jana Hakim we will call in Medrol dose pak.

## 2015-09-30 ENCOUNTER — Other Ambulatory Visit: Payer: Self-pay | Admitting: *Deleted

## 2015-09-30 DIAGNOSIS — C50411 Malignant neoplasm of upper-outer quadrant of right female breast: Secondary | ICD-10-CM

## 2015-10-03 ENCOUNTER — Ambulatory Visit (HOSPITAL_BASED_OUTPATIENT_CLINIC_OR_DEPARTMENT_OTHER): Payer: Medicare Other

## 2015-10-03 ENCOUNTER — Ambulatory Visit (HOSPITAL_BASED_OUTPATIENT_CLINIC_OR_DEPARTMENT_OTHER): Payer: Medicare Other | Admitting: Oncology

## 2015-10-03 ENCOUNTER — Other Ambulatory Visit (HOSPITAL_BASED_OUTPATIENT_CLINIC_OR_DEPARTMENT_OTHER): Payer: Medicare Other

## 2015-10-03 VITALS — BP 136/73 | HR 72 | Temp 98.6°F | Resp 18 | Ht 63.0 in | Wt 172.9 lb

## 2015-10-03 DIAGNOSIS — N3010 Interstitial cystitis (chronic) without hematuria: Secondary | ICD-10-CM

## 2015-10-03 DIAGNOSIS — M810 Age-related osteoporosis without current pathological fracture: Secondary | ICD-10-CM

## 2015-10-03 DIAGNOSIS — C773 Secondary and unspecified malignant neoplasm of axilla and upper limb lymph nodes: Secondary | ICD-10-CM

## 2015-10-03 DIAGNOSIS — M797 Fibromyalgia: Secondary | ICD-10-CM

## 2015-10-03 DIAGNOSIS — R21 Rash and other nonspecific skin eruption: Secondary | ICD-10-CM | POA: Diagnosis not present

## 2015-10-03 DIAGNOSIS — N6022 Fibroadenosis of left breast: Secondary | ICD-10-CM

## 2015-10-03 DIAGNOSIS — Z170 Estrogen receptor positive status [ER+]: Secondary | ICD-10-CM | POA: Diagnosis not present

## 2015-10-03 DIAGNOSIS — C50411 Malignant neoplasm of upper-outer quadrant of right female breast: Secondary | ICD-10-CM

## 2015-10-03 DIAGNOSIS — Z5111 Encounter for antineoplastic chemotherapy: Secondary | ICD-10-CM

## 2015-10-03 DIAGNOSIS — K1370 Unspecified lesions of oral mucosa: Secondary | ICD-10-CM | POA: Diagnosis not present

## 2015-10-03 LAB — COMPREHENSIVE METABOLIC PANEL
ALK PHOS: 76 U/L (ref 40–150)
ALT: 14 U/L (ref 0–55)
ANION GAP: 9 meq/L (ref 3–11)
AST: 12 U/L (ref 5–34)
Albumin: 3.6 g/dL (ref 3.5–5.0)
BILIRUBIN TOTAL: 0.44 mg/dL (ref 0.20–1.20)
BUN: 14.1 mg/dL (ref 7.0–26.0)
CO2: 26 mEq/L (ref 22–29)
CREATININE: 0.8 mg/dL (ref 0.6–1.1)
Calcium: 9.5 mg/dL (ref 8.4–10.4)
Chloride: 101 mEq/L (ref 98–109)
EGFR: 74 mL/min/{1.73_m2} — AB (ref >90)
Glucose: 96 mg/dl (ref 70–140)
Potassium: 4.3 mEq/L (ref 3.5–5.1)
Sodium: 137 mEq/L (ref 136–145)
TOTAL PROTEIN: 7.2 g/dL (ref 6.4–8.3)

## 2015-10-03 LAB — CBC WITH DIFFERENTIAL/PLATELET
BASO%: 0.3 % (ref 0.0–2.0)
Basophils Absolute: 0 10*3/uL (ref 0.0–0.1)
EOS ABS: 0 10*3/uL (ref 0.0–0.5)
EOS%: 0 % (ref 0.0–7.0)
HEMATOCRIT: 36.5 % (ref 34.8–46.6)
HEMOGLOBIN: 12.5 g/dL (ref 11.6–15.9)
LYMPH#: 1.5 10*3/uL (ref 0.9–3.3)
LYMPH%: 12.4 % — AB (ref 14.0–49.7)
MCH: 31 pg (ref 25.1–34.0)
MCHC: 34.3 g/dL (ref 31.5–36.0)
MCV: 90.4 fL (ref 79.5–101.0)
MONO#: 0.9 10*3/uL (ref 0.1–0.9)
MONO%: 7.9 % (ref 0.0–14.0)
NEUT%: 79.4 % — ABNORMAL HIGH (ref 38.4–76.8)
NEUTROS ABS: 9.5 10*3/uL — AB (ref 1.5–6.5)
PLATELETS: 530 10*3/uL — AB (ref 145–400)
RBC: 4.03 10*6/uL (ref 3.70–5.45)
RDW: 13.8 % (ref 11.2–14.5)
WBC: 11.9 10*3/uL — AB (ref 3.9–10.3)

## 2015-10-03 MED ORDER — DIPHENHYDRAMINE HCL 25 MG PO CAPS
25.0000 mg | ORAL_CAPSULE | Freq: Once | ORAL | Status: AC
  Administered 2015-10-03: 25 mg via ORAL

## 2015-10-03 MED ORDER — SODIUM CHLORIDE 0.9 % IV SOLN
75.0000 mg/m2 | Freq: Once | INTRAVENOUS | Status: AC
  Administered 2015-10-03: 140 mg via INTRAVENOUS
  Filled 2015-10-03: qty 14

## 2015-10-03 MED ORDER — DIPHENHYDRAMINE HCL 25 MG PO CAPS
ORAL_CAPSULE | ORAL | Status: AC
  Filled 2015-10-03: qty 1

## 2015-10-03 MED ORDER — SODIUM CHLORIDE 0.9% FLUSH
10.0000 mL | INTRAVENOUS | Status: DC | PRN
  Administered 2015-10-03: 10 mL
  Filled 2015-10-03: qty 10

## 2015-10-03 MED ORDER — SODIUM CHLORIDE 0.9 % IV SOLN
600.0000 mg/m2 | Freq: Once | INTRAVENOUS | Status: AC
  Administered 2015-10-03: 1120 mg via INTRAVENOUS
  Filled 2015-10-03: qty 56

## 2015-10-03 MED ORDER — DEXAMETHASONE SODIUM PHOSPHATE 100 MG/10ML IJ SOLN
10.0000 mg | Freq: Once | INTRAMUSCULAR | Status: AC
  Administered 2015-10-03: 10 mg via INTRAVENOUS
  Filled 2015-10-03: qty 1

## 2015-10-03 MED ORDER — PALONOSETRON HCL INJECTION 0.25 MG/5ML
INTRAVENOUS | Status: AC
  Filled 2015-10-03: qty 5

## 2015-10-03 MED ORDER — DOCETAXEL CHEMO INJECTION 160 MG/16ML: 75.0000 mg/m2 | Freq: Once | INTRAVENOUS | Status: DC

## 2015-10-03 MED ORDER — PALONOSETRON HCL INJECTION 0.25 MG/5ML
0.2500 mg | Freq: Once | INTRAVENOUS | Status: AC
  Administered 2015-10-03: 0.25 mg via INTRAVENOUS

## 2015-10-03 MED ORDER — SODIUM CHLORIDE 0.9 % IV SOLN
Freq: Once | INTRAVENOUS | Status: AC
  Administered 2015-10-03: 13:00:00 via INTRAVENOUS

## 2015-10-03 MED ORDER — PEGFILGRASTIM 6 MG/0.6ML ~~LOC~~ PSKT
6.0000 mg | PREFILLED_SYRINGE | Freq: Once | SUBCUTANEOUS | Status: AC
  Administered 2015-10-03: 6 mg via SUBCUTANEOUS
  Filled 2015-10-03: qty 0.6

## 2015-10-03 MED ORDER — HEPARIN SOD (PORK) LOCK FLUSH 100 UNIT/ML IV SOLN
500.0000 [IU] | Freq: Once | INTRAVENOUS | Status: AC | PRN
  Administered 2015-10-03: 500 [IU]
  Filled 2015-10-03: qty 5

## 2015-10-03 NOTE — Progress Notes (Signed)
[place]  [phone] [phone]) [phone]     ID: [name] DOB: Nov 15, 1945  MR#: [id number]  JKD#:[id number]  Patient Care Team: Seward Carol, MD as PCP - General (Internal Medicine) Chauncey Cruel, MD as Consulting Physician (Oncology) Fanny Skates, MD as Consulting Physician (General Surgery) Luberta Robertson, MD (Radiology) Hennie Duos, MD as Consulting Physician (Rheumatology) Domingo Pulse, MD (Urology) Druscilla Brownie, MD as Consulting Physician (Dermatology) PCP: Kandice Hams, MD GYN: OTHER MD:  CHIEF COMPLAINT: Locally advanced breast cancer  CURRENT TREATMENT: Neoadjuvant chemotherapy  BREAST CANCER HISTORY: From the original intake note:  [name] had routine mammographic screening in 08/18/2015 at the [facility], showing a right breast mass and possible axillary adenopathy. On 08/23/2015 she underwent bilateral diagnostic mammography with tomography and bilateral breast ultrasonography. The breast density was category B in the right breast there was a spiculated mass in the upper-outer quadrant measuring 1.2 cm and associated with pleomorphic calcifications. There were calcifications extending anteriorly and posteriorly from the mass worrisome for ductal carcinoma in situ. The total area in question is about 3 cm. There were also multiple enlarged right axillary lymph nodes. The mass was palpated as an area of fullness at the 12:00 position of the breast 3 cm from the nipple. There were palpable enlarged right axillary lymph nodes in the right axilla. Ultrasonography of the right breast confirmed an irregular hypoechoic mass in the area in question measuring 1.6 cm. There were multiple abnormal appearing enlarged right axillary lymph nodes, the largest measuring 2.3 cm.  In the left breast, mammography showed an area of architectural distortion associated with the left nipple. This was not palpable. Ultrasonography of the left  breast found no correlate. This area of the left breast was biopsied under tomography 08/24/2015. This showed ([id number] [id number]) a complex sclerosing lesion and fibroadenoma.  On 08/23/2015 the patient underwent biopsy of the right breast mass in 1 of the abnormal right axillary lymph nodes. 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 Both biopsies were HER-2 nonamplified, the breast being ratio 1.27 and number per cell 1.90, and the lymph node 1 ratio 1.24 and number per cell 3.0.  On 08/26/2015 the patient underwent bilateral breast MRI. In the left breast there was no suspicious enhancement. The left axilla was benign. In the right breast there was a mass described as in the upper inner quadrant measuring 2.3 cm. Less than a centimeter away from this mass there was a satellite nodule measuring 0.9 cm. In the right axilla there were multiple enlarged right axillary lymph nodes, including a confluent region measuring up to 4 cm. There were also enlarged level II lymph nodes present measuring up to 2.1 cm.  The patient's subsequent history is as detailed below  INTERVAL HISTORY: [name] returns today for follow up of her estrogen receptor positive breastcancer, accompanied by her 2 daughters. Today is day 1 cycle 2 of her 4 planned cycles of cyclophosphamide and docetaxel since her last visit here she also had biopsy of the satellite lesion in the right breast, which showed ([id number]) invasive ductal carcinoma, grade 2, estrogen receptor 100% positive with strong staining intensity, progesterone receptor 5% positive with moderate staining intensity, with an MIB-1 of 5%, and no HER-2 notification, the signals ratio being 1.30 and the number per cell 1.75.   She had her  hair but stopped and she got herself 3 weeks "in the blond family". She actually  looks quite terrific with her hair short. It makes her eyes look bigger.  REVIEW OF SYSTEMS: [name] had a pretty rough time with the first cyclethe rash took forever to clear. He treated it as afungal infection which is what I thought it was but it really did not respond to any of the agents we use. Finally she got a Medrol Dosepak and that pretty much cleared. She also had problems with our weekend coverage. She feels she couldn't get through and when she did get through she didn't get much help area her daughter, who is an infectious disease physician suggested magic mouthwash for her mouth sores and that helped also. [name] is currently not exercising but she tells me her neighbor is opening his poor oral this coming week and she is hoping to get into it. Aside from that a detailed review of systems today was stable  PAST MEDICAL HISTORY: Past Medical History  Diagnosis Date  . Osteoporosis   . Menopausal symptoms   . Chronic vaginitis   . Fibromyalgia   . Interstitial cystitis   . Thyroid disease     hypothyroid  . Hypoglycemia   . Asthma   . PONV (postoperative nausea and vomiting)   . Cancer [facility])     breast cancer  . Pneumonia   . Depression     PAST SURGICAL HISTORY: Past Surgical History  Procedure Laterality Date  . Nasal sinus surgery      X4  . Bladder hyperextension      X4  . Bunionectomy    . Leg surgery  06/2004    Broken tibia  . Cholecystectomy  01/2006  . Cesarean section      DR. INGRAM  . Mouth surgery    . Dental surgery      13 dental implants  . Colonoscopy    . Portacath placement Left 09/08/2015    Procedure: INSERTION OF PORT-A-CATH ;  Surgeon: Fanny Skates, MD;  [facility] OR;  Service: General;  Laterality: Left;    FAMILY HISTORY Family History  Problem Relation Age of Onset  . Hypertension Mother   . Heart disease Mother   . Parkinsonism Father   . Heart disease Sister   . Cancer Sister     MELANOMA  . Breast cancer Sister     Age  76  The patient's father died at age 75 from complications of Parkinson's disease. The patient's mother died age 45 with heart disease. Jocee had no brothers. She had 2 sisters. One sister was diagnosed with breast cancer at age 54. She was tested for the BRCA gene and was negative. The same sister also had melanoma diagnosed in her 83s. The patient has one maternal great aunt diagnosed with breast cancer around age 24. There is no history of ovarian cancer in the family  GYNECOLOGIC HISTORY:  No LMP recorded. Patient is postmenopausal. Menarche age 83, first live birth age 82. The patient is GX P2. She stopped having periods in her early 85s and took hormone replacement until her breast cancer diagnosis, April 2017.  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 He has been in remission for more than 10 years. Their daughter Nira Conn lives in  [place] were she is Production designer, theatre/television/film of the [employer]. Daughter Belenda Cruise is an infectious disease M.D. working and teaching at [employer] in [place]. The patient has one granddaughter, 84 months old as of May 2017. The family attends [employer]: Not in place   HEALTH MAINTENANCE: Social History  Substance Use Topics  . Smoking status: Former Smoker    Types: Cigarettes  . Smokeless tobacco: Never Used  . Alcohol Use: 0.0 oz/week    0 Standard drinks or equivalent per week     Comment: social     Colonoscopy: 2016/ [facility]  PAP: 2015  Bone density:   Lipid panel:  Allergies  Allergen Reactions  . Fluconazole     REACTION: elavated LFTs  . Nitrofurantoin Other (See Comments)    Unknown reaction  . Penicillins Other (See Comments)    Has patient had a PCN reaction causing immediate rash, facial/tongue/throat  swelling, SOB or lightheadedness with hypotension: No Has patient had a PCN reaction causing severe rash involving mucus membranes or skin necrosis: No Has patient had a PCN reaction that required hospitalization No Has patient had a PCN reaction occurring within the last 10 years: No If all of the above answers are "NO", then may proceed with Cephalosporin use.  . Other Rash    Adhesive tape    Current Outpatient Prescriptions  Medication Sig Dispense Refill  . azithromycin (ZITHROMAX) 250 MG tablet Take as package instructs 6 each 0  . buPROPion (WELLBUTRIN XL) 300 MG 24 hr tablet Take 300 mg by mouth daily.      . calcitonin, salmon, (MIACALCIN/FORTICAL) 200 UNIT/ACT nasal spray Place 1 spray into alternate nostrils daily. 3.7 mL 11  . Calcium Carbonate-Vitamin D (CALCIUM + D PO) Take 1 tablet by mouth daily.     . ciprofloxacin (CIPRO) 500 MG tablet Take 1 tablet (500 mg total) by mouth 2 (two) times daily. 10 tablet 0  . clotrimazole-betamethasone (LOTRISONE) cream Apply topically 2 (two) times daily. (Patient taking differently: Apply 1 application topically as needed. Apply to affected area as needed) 45 g 3  . dexamethasone (DECADRON) 4 MG tablet Take 2 tablets (8 mg total) by mouth 2 (two) times daily. Start the day before Taxotere. Then again the day after chemo for 3 days. 30 tablet 1  . diazepam (VALIUM) 5 MG tablet Take 5 mg by mouth every 6 (six) hours as needed for anxiety.     . fluconazole (DIFLUCAN) 100 MG tablet Take 1 tablet (100 mg total) by mouth daily. 20 tablet 0  . hydrOXYzine (ATARAX/VISTARIL) 25 MG tablet Take 25 mg by mouth 3 (three) times daily as needed for anxiety or itching.    Marland Kitchen ketoconazole (NIZORAL) 2 % cream Apply 1 application topically daily. 15 g 0  . Levothyroxine Sodium (SYNTHROID PO) Take 137 mcg by mouth daily.     Marland Kitchen lidocaine-prilocaine (EMLA) cream Apply to affected area once (Patient taking differently: Apply 1 application topically as needed.  Apply to affected area once) 30 g 3  . LORazepam (ATIVAN) 0.5 MG tablet Take 1 tablet (0.5 mg total) by mouth at bedtime as needed (Nausea or vomiting). 30 tablet 0  . methylPREDNISolone (MEDROL DOSEPAK) 4 MG TBPK tablet Take as directed 1 tablet 0  . Multiple Vitamin (MULTIVITAMIN) capsule Take 1 capsule by mouth daily.      . nabumetone (RELAFEN) 750 MG tablet Take 750 mg by mouth 2 (two) times daily as needed for mild  pain.    . nystatin cream (MYCOSTATIN) Apply 1 application topically 2 (two) times daily. 30 g 0  . ondansetron (ZOFRAN) 8 MG tablet Take 1 tablet (8 mg total) by mouth 2 (two) times daily as needed for refractory nausea / vomiting. Start on day 3 after chemo. 30 tablet 1  . oxyCODONE-acetaminophen (PERCOCET) 7.5-325 MG tablet Take 1 tablet by mouth every 4 (four) hours as needed for severe pain. 30 tablet 0  . pantoprazole (PROTONIX) 40 MG tablet Take 40 mg by mouth daily.    . prochlorperazine (COMPAZINE) 10 MG tablet Take 1 tablet (10 mg total) by mouth every 6 (six) hours as needed (Nausea or vomiting). 30 tablet 1  . sulfamethoxazole-trimethoprim (BACTRIM DS,SEPTRA DS) 800-160 MG per tablet Take 1 tablet by mouth 2 (two) times daily. (Patient taking differently: Take 1 tablet by mouth as needed. ) 14 tablet 1  . terconazole (TERAZOL 7) 0.4 % vaginal cream Place 1 applicator vaginally at bedtime. (Patient taking differently: Place 1 applicator vaginally at bedtime as needed. ) 45 g 1  . tobramycin (TOBREX) 0.3 % ophthalmic solution Place 1-2 drops into the right eye 4 (four) times daily. X 5 days (Patient taking differently: Place 1-2 drops into the right eye 4 (four) times daily as needed. ) 5 mL 0  . Triamterene-HCTZ (DYAZIDE PO) Take 1 tablet by mouth daily.     Marland Kitchen triamterene-hydrochlorothiazide (MAXZIDE) 75-50 MG tablet Take 1 tablet by mouth daily.    . valACYclovir (VALTREX) 500 MG tablet Take 1 tablet (500 mg total) by mouth daily. 30 tablet 1  . Vitamin D, Ergocalciferol,  (DRISDOL) 50000 UNITS CAPS capsule One tablet every other week (Patient taking differently: Take 50,000 Units by mouth every 14 (fourteen) days. One tablet every other week) 10 capsule 3  . zolpidem (AMBIEN) 10 MG tablet Take 10 mg by mouth at bedtime as needed for sleep.      No current facility-administered medications for this visit.    OBJECTIVE: Middle-aged white woman in no acute distress Filed Vitals:   10/03/15 1144  BP: 136/73  Pulse: 72  Temp: 98.6 F (37 C)  Resp: 18     Body mass index is 30.64 kg/(m^2).    ECOG FS:1 - Symptomatic but completely ambulatory  Sclerae unicteric, pupils round and equal Oropharynx clear and moist-- no thrush or other lesions No cervical or supraclavicular adenopathy Lungs no rales or rhonchi Heart regular rate and rhythm Abd soft, nontender, positive bowel sounds MSK no focal spinal tenderness, no upper extremity lymphedema Neuro: nonfocal, well oriented, appropriate affect Breasts: The right breast is status post recent biopsy. There is yet another bruise. There are no suspicious findings otherwise, however. The right axilla is benign. The left breast is unremarkable. Skin: No significant rash noted.  LAB RESULTS:  CMP     Component Value Date/Time   NA 135* 09/21/2015 1138   K 3.4* 09/21/2015 1138   CO2 25 09/21/2015 1138   GLUCOSE 113 09/21/2015 1138   BUN 8.4 09/21/2015 1138   CREATININE 0.9 09/21/2015 1138   CALCIUM 8.9 09/21/2015 1138   PROT 6.7 09/21/2015 1138   ALBUMIN 3.2* 09/21/2015 1138   AST 23 09/21/2015 1138   ALT 42 09/21/2015 1138   ALKPHOS 134 09/21/2015 1138   BILITOT <0.30 09/21/2015 1138    INo results found for: SPEP, UPEP  Lab Results  Component Value Date   WBC 11.9* 10/03/2015   NEUTROABS 9.5* 10/03/2015   HGB 12.5 10/03/2015  HCT 36.5 10/03/2015   MCV 90.4 10/03/2015   PLT 530* 10/03/2015      Chemistry      Component Value Date/Time   NA 135* 09/21/2015 1138   K 3.4* 09/21/2015 1138    CO2 25 09/21/2015 1138   BUN 8.4 09/21/2015 1138   CREATININE 0.9 09/21/2015 1138      Component Value Date/Time   CALCIUM 8.9 09/21/2015 1138   ALKPHOS 134 09/21/2015 1138   AST 23 09/21/2015 1138   ALT 42 09/21/2015 1138   BILITOT <0.30 09/21/2015 1138       No results found for: LABCA2  No components found for: LABCA125  No results for input(s): INR in the last 168 hours.  Urinalysis No results found for: COLORURINE, APPEARANCEUR, LABSPEC, PHURINE, GLUCOSEU, HGBUR, BILIRUBINUR, KETONESUR, PROTEINUR, UROBILINOGEN, NITRITE, LEUKOCYTESUR    ELIGIBLE FOR AVAILABLE RESEARCH PROTOCOL: no  STUDIES: Ct Chest W Contrast  09/06/2015  CLINICAL DATA:  New diagnosis RIGHT breast cancer. Initial evaluation EXAM: CT CHEST WITH CONTRAST TECHNIQUE: Multidetector CT imaging of the chest was performed during intravenous contrast administration. CONTRAST:  75 mL Isovue COMPARISON:  None. FINDINGS: Mediastinum/Nodes: Multiple enlarged RIGHT axillary lymph nodes. Example lymph node measures 17 mm short axis on image 23, series 2. Lymph node beneath the RIGHT sub pectoralis muscle measures 13 mm short axis on 20, series 2. No supraclavicular or infraclavicular adenopathy present. No internal mammary adenopathy.  No mediastinal adenopathy. No central pulmonary embolism. Coronary calcifications. Esophagus normal. Lungs/Pleura: Multiple scattered noncalcified pulmonary nodules which are less than 5 mm throughout the lungs. For example 3 mm RIGHT upper lobe nodule on image 30, series 5. 3 mm nodule on image 27, series 5. 4 mm LEFT upper lobe nodule image 30, series 5. These nodules have an upper lobe predominance. Several ringed nodules present in the LEFT lower lobe (image 121, series 5 example measuring 4 mm). Upper abdomen: Postcholecystectomy. Adrenal glands are normal. Upper poles of the kidneys are normal. No upper abdominal adenopathy. Musculoskeletal: No aggressive osseous lesion. IMPRESSION: 1.  Metastatic adenopathy to the RIGHT axilla and sub pectoralis nodal stations. 2. No central metastatic adenopathy. 3. Multiple sub 5 mm noncalcified nodules predominantly within the upper lobes but also within the lower lobes. Common differential includes noncalcified granulomatous disease versus metastatic disease. The uniformity of the nodular size favors a non metastatic process. These nodules are too small to be evaluated by FDG PET scan. Electronically Signed   By: Suzy Bouchard M.D.   On: 09/06/2015 09:00   Nm Bone Scan Whole Body  09/06/2015  CLINICAL DATA:  Breast cancer staging ; the patient reports lumbar back pain and history of a tibial fracture, no recent trauma. EXAM: NUCLEAR MEDICINE WHOLE BODY BONE SCAN TECHNIQUE: Whole body anterior and posterior images were obtained approximately 3 hours after intravenous injection of radiopharmaceutical. RADIOPHARMACEUTICALS:  26.0 mCi Technetium-8mMDP IV COMPARISON:  None in PACs FINDINGS: There is adequate uptake of the radiopharmaceutical by the skeleton. There is adequate soft tissue clearance and renal activity. Uptake within the calvarium is normal. Mildly increased uptake posteriorly in the mid cervical spine is compatible with degenerative change. Minimal increased uptake at approximately T3 and T10 posteriorly is consistent with degenerative change. Mildly increased uptake at approximately L4 is consistent with degenerative change. Uptake within the ribs is normal. There is focally increased uptake in the first carpometacarpal joint on the left. Uptake within the pelvis and thighs and legs is normal. Mildly increased uptake in the midfoot  on the right is present. IMPRESSION: No uptake demonstrated to suggest metastatic disease. Areas of increased uptake described above are most compatible with degenerative change. Electronically Signed   By: David  Martinique M.D.   On: 09/06/2015 11:20   Dg Chest Port 1 View  09/08/2015  CLINICAL DATA:  Port-A-Cath  placement EXAM: PORTABLE CHEST 1 VIEW COMPARISON:  07/31/2011 FINDINGS: Borderline cardiomegaly. Mild hyperinflation again noted. No acute infiltrate or pulmonary edema. There is left subclavian Port-A-Cath with tip in distal SVC. No pneumothorax. IMPRESSION: Mild hyperinflation. No active disease. Left subclavian Port-A-Cath in place. No pneumothorax. Electronically Signed   By: Lahoma Crocker M.D.   On: 09/08/2015 16:00   Mm Digital Diagnostic Unilat R  09/13/2015  CLINICAL DATA:  Status post MRI-guided biopsy of a suspicious enhancing mass in the right breast earlier today. Recently diagnosed right breast cancer within the upper inner quadrant. Subsequent MRI showed an additional suspicious enhancing satellite mass within the inner right breast, slightly medial and inferior to the biopsy-proven cancer. The additional satellite mass was biopsied today. EXAM: DIAGNOSTIC RIGHT MAMMOGRAM POST MRI BIOPSY COMPARISON:  Previous exam(s). FINDINGS: Mammographic images were obtained following MRI guided biopsy of the right breast mass located slightly medial and inferior to patient's biopsy-proven cancer. At the conclusion of this procedure, a barbell shaped tissue marker was deployed in the biopsy cavity. This clip has been displaced approximately 10 mm anteriorly by a post biopsy hematoma. IMPRESSION: Postprocedure mammogram for clip placement. BARBELL SHAPED CLIP placed today after biopsy of the satellite mass located slightly medial and inferior to the biopsy-proven right breast cancer. Today's CLIP IS DISPLACED 10 MM ANTERIORLY by an associated post biopsy hematoma. Ribbon shaped clip within the upper right breast was placed at the time of the earlier biopsy, denoting the site of patient's known biopsy-proven cancer. Final Assessment: Post Procedure Mammograms for Marker Placement Electronically Signed   By: Franki Cabot M.D.   On: 09/13/2015 09:35   Dg Fluoro Guide Cv Line-no Report  09/08/2015  CLINICAL DATA:   FLOURO GUIDE CV LINE Fluoroscopy was utilized by the requesting physician.  No radiographic interpretation.   Mr Rt Breast Bx Johnella Moloney Dev 1st Lesion Image Bx Spec Mr Guide  09/14/2015  ADDENDUM REPORT: 09/14/2015 15:20 ADDENDUM: Pathology revealed GRADE II INVASIVE AND IN SITU DUCTAL CARCINOMA of the inner Right breast. This was found to be concordant by Dr. Franki Cabot. Pathology results were discussed with the patient by telephone. The patient reported doing well after the biopsy with tenderness at the site. Post biopsy instructions and care were reviewed and questions were answered. The patient was encouraged to call [facility] for any additional concerns. The patient has a recent diagnosis of additional right breast cancer, slightly superior and lateral to this biopsy site, and should follow her outlined treatment plan. Please note that the barbell shaped clip placed at the time of this biopsy is DISPLACED 10 MM ANTERIORLY by an associated post biopsy hematoma. This may affect preoperative localization if breast conservation therapy is desired. Pathology results reported by Terie Purser, RN on 09/14/2015. Electronically Signed   By: Franki Cabot M.D.   On: 09/14/2015 15:20  09/14/2015  CLINICAL DATA:  Recent biopsy-proven cancer within the upper right breast. Subsequent breast MRI showing an additional suspicious enhancing satellite mass located slightly medial and inferior to the biopsy-proven right breast cancer. Patient presents today for MRI-guided core biopsy of this additional satellite mass. EXAM: MRI GUIDED CORE NEEDLE  BIOPSY OF THE RIGHT BREAST TECHNIQUE: Multiplanar, multisequence MR imaging of the right breast was performed both before and after administration of intravenous contrast. CONTRAST:  75m MULTIHANCE GADOBENATE DIMEGLUMINE 529 MG/ML IV SOLN COMPARISON:  Previous exams. FINDINGS: I met with the patient, and we discussed the procedure of MRI guided biopsy,  including risks, benefits, and alternatives. Specifically, we discussed the risks of infection, bleeding, tissue injury, clip migration, and inadequate sampling. Informed, written consent was given. The usual time out protocol was performed immediately prior to the procedure. Using sterile technique, 1% Lidocaine, MRI guidance, and a 9 gauge vacuum assisted device, biopsy was performed of the additional enhancing satellite mass in the right breast, located slightly medial and inferior to the biopsy-proven right breast cancer, using a medial approach. At the conclusion of the procedure, a barbell shaped tissue marker clip was deployed into the biopsy cavity. Follow-up 2-view mammogram was performed and dictated separately. IMPRESSION: MRI guided biopsy of the additional satellite mass in the right breast, located slightly medial and inferior to the biopsy-proven cancer in the upper right breast. No apparent complications. Electronically Signed: By: SFranki CabotM.D. On: 09/13/2015 09:56    ASSESSMENT: 70y.o. [place] woman status post right breast upper outer quadrant and right axillary lymph node biopsy 08/23/2015, both positive for a clinical T1 N2, stage IIIA invasive ductal carcinoma, grade 2, strongly estrogen receptor positive, 5% progesterone receptor positive, with an MIB-120-30% and no HER-2 amplification  (a) biopsy of a 0.9 cm Right breast satellite nodule 09/13/2015 showed invasive ductal carcinoma, grade 2, E-cadherin strongly positive, with a prognostic panel identical to the larger right upper quadrant mass  (1) left breast biopsy 08/24/2015 shows a complex sclerosing lesion  (2) neoadjuvant chemotherapy will consist of cyclophosphamide and docetaxel every 21 days 4, with OnPro support, starting 09/12/2015  (3) definitive surgery to follow, likely including full axillary lymph node dissection  (4) adjuvant radiation to follow surgery  (5) anti-estrogens for 10 years to follow at the  completion of local treatment-- consider PALLAS trial  (6) genetics testing pending (Askenazi descent)  PLAN: LLupitais proceeding to her second of 4 planned chemotherapy doses today. I am hoping that the rash she had was due to either the iodine or chlorhexidine that she received  At the time of port placement. In fact I was called by the chemotherapy nurse today to tell me that when they washed her port area with chlorhexidine she started to develop a rash again. (I suggested they wipe the chlorhexidine off and use alcohol instead).  She is going to use her acyclovir twice a day in an attempt to prevent the mouth sores. She will use the Diflucan if needed. She is welcome to use Magic mouthwash as well.  Obviously she will be on steroids from yesterday through Thursday. I have asked her to call me on Saturday and let me know if the rash is starting to develop in which case I will put her on a Medrol Dosepak again since that is the only thing that actually worked  She requested some hydroxyzine which I was glad to write for her.  She needs some dental work done (not extractions). I suggested the best time for that and also for visiting with her toddler grandchildren is the few days before the next treatment, by which time we know her counts will be fine.  Otherwise she will see me again in one week. She knows to call for any problems that may develop  before that visit.   Chauncey Cruel, MD

## 2015-10-03 NOTE — Patient Instructions (Signed)
[facility] Discharge Instructions for Patients Receiving Chemotherapy  Today you received the following chemotherapy agents Taxotere/Cytoxan To help prevent nausea and vomiting after your treatment, we encourage you to take your nausea medication as prescribed.   If you develop nausea and vomiting that is not controlled by your nausea medication, call the clinic.   BELOW ARE SYMPTOMS THAT SHOULD BE REPORTED IMMEDIATELY:  *FEVER GREATER THAN 100.5 F  *CHILLS WITH OR WITHOUT FEVER  NAUSEA AND VOMITING THAT IS NOT CONTROLLED WITH YOUR NAUSEA MEDICATION  *UNUSUAL SHORTNESS OF BREATH  *UNUSUAL BRUISING OR BLEEDING  TENDERNESS IN MOUTH AND THROAT WITH OR WITHOUT PRESENCE OF ULCERS  *URINARY PROBLEMS  *BOWEL PROBLEMS  UNUSUAL RASH Items with * indicate a potential emergency and should be followed up as soon as possible.  Feel free to call the clinic you have any questions or concerns. The clinic phone number is [phone].  Please show the CHEMO ALERT CARD at check-in to the Emergency Department and triage nurse.   

## 2015-10-05 ENCOUNTER — Ambulatory Visit: Payer: Self-pay

## 2015-10-06 ENCOUNTER — Other Ambulatory Visit: Payer: Self-pay | Admitting: *Deleted

## 2015-10-06 DIAGNOSIS — C50411 Malignant neoplasm of upper-outer quadrant of right female breast: Secondary | ICD-10-CM

## 2015-10-10 ENCOUNTER — Encounter: Payer: Self-pay | Admitting: Genetic Counselor

## 2015-10-10 ENCOUNTER — Other Ambulatory Visit (HOSPITAL_BASED_OUTPATIENT_CLINIC_OR_DEPARTMENT_OTHER): Payer: Medicare Other

## 2015-10-10 ENCOUNTER — Telehealth: Payer: Self-pay | Admitting: Oncology

## 2015-10-10 ENCOUNTER — Ambulatory Visit (HOSPITAL_BASED_OUTPATIENT_CLINIC_OR_DEPARTMENT_OTHER): Payer: Medicare Other | Admitting: Oncology

## 2015-10-10 VITALS — BP 139/80 | HR 77 | Temp 98.4°F | Resp 18 | Ht 63.0 in | Wt 169.9 lb

## 2015-10-10 DIAGNOSIS — C773 Secondary and unspecified malignant neoplasm of axilla and upper limb lymph nodes: Secondary | ICD-10-CM

## 2015-10-10 DIAGNOSIS — B368 Other specified superficial mycoses: Secondary | ICD-10-CM | POA: Diagnosis not present

## 2015-10-10 DIAGNOSIS — C50411 Malignant neoplasm of upper-outer quadrant of right female breast: Secondary | ICD-10-CM | POA: Diagnosis present

## 2015-10-10 DIAGNOSIS — M791 Myalgia: Secondary | ICD-10-CM

## 2015-10-10 DIAGNOSIS — Z170 Estrogen receptor positive status [ER+]: Secondary | ICD-10-CM | POA: Diagnosis not present

## 2015-10-10 LAB — CBC WITH DIFFERENTIAL/PLATELET
BASO%: 1.2 % (ref 0.0–2.0)
BASOS ABS: 0 10*3/uL (ref 0.0–0.1)
EOS ABS: 0.1 10*3/uL (ref 0.0–0.5)
EOS%: 2.7 % (ref 0.0–7.0)
HEMATOCRIT: 36.9 % (ref 34.8–46.6)
HEMOGLOBIN: 12.5 g/dL (ref 11.6–15.9)
LYMPH#: 1.3 10*3/uL (ref 0.9–3.3)
LYMPH%: 33.3 % (ref 14.0–49.7)
MCH: 30.4 pg (ref 25.1–34.0)
MCHC: 33.9 g/dL (ref 31.5–36.0)
MCV: 89.8 fL (ref 79.5–101.0)
MONO#: 1.2 10*3/uL — ABNORMAL HIGH (ref 0.1–0.9)
MONO%: 30 % — AB (ref 0.0–14.0)
NEUT%: 32.8 % — ABNORMAL LOW (ref 38.4–76.8)
NEUTROS ABS: 1.3 10*3/uL — AB (ref 1.5–6.5)
Platelets: 108 10*3/uL — ABNORMAL LOW (ref 145–400)
RBC: 4.11 10*6/uL (ref 3.70–5.45)
RDW: 13.4 % (ref 11.2–14.5)
WBC: 4 10*3/uL (ref 3.9–10.3)

## 2015-10-10 LAB — COMPREHENSIVE METABOLIC PANEL
ALBUMIN: 3.3 g/dL — AB (ref 3.5–5.0)
ALK PHOS: 95 U/L (ref 40–150)
ALT: 27 U/L (ref 0–55)
AST: 20 U/L (ref 5–34)
Anion Gap: 9 mEq/L (ref 3–11)
BILIRUBIN TOTAL: 0.51 mg/dL (ref 0.20–1.20)
BUN: 9.9 mg/dL (ref 7.0–26.0)
CALCIUM: 9.2 mg/dL (ref 8.4–10.4)
CO2: 27 mEq/L (ref 22–29)
Chloride: 99 mEq/L (ref 98–109)
Creatinine: 0.8 mg/dL (ref 0.6–1.1)
EGFR: 75 mL/min/{1.73_m2} — AB (ref >90)
GLUCOSE: 96 mg/dL (ref 70–140)
Potassium: 3.8 mEq/L (ref 3.5–5.1)
SODIUM: 135 meq/L — AB (ref 136–145)
TOTAL PROTEIN: 6.7 g/dL (ref 6.4–8.3)

## 2015-10-10 MED ORDER — FLUCONAZOLE 100 MG PO TABS: 100.0000 mg | ORAL_TABLET | Freq: Every day | ORAL | Status: DC

## 2015-10-10 MED ORDER — HYDROXYZINE HCL 25 MG PO TABS: 25.0000 mg | ORAL_TABLET | Freq: Three times a day (TID) | ORAL | Status: DC | PRN

## 2015-10-10 MED ORDER — OXYCODONE-ACETAMINOPHEN 5-325 MG PO TABS: 1.0000 | ORAL_TABLET | Freq: Three times a day (TID) | ORAL | Status: DC | PRN

## 2015-10-10 MED ORDER — VALACYCLOVIR HCL 500 MG PO TABS: 500.0000 mg | ORAL_TABLET | Freq: Every day | ORAL | Status: DC

## 2015-10-10 NOTE — Progress Notes (Signed)
Portal  Telephone:([phone] [phone]) [phone]     ID: [name] DOB: 02/13/1946  MR#: [id number]  RDE#:[id number]  Patient Care Team: Seward Carol, MD as PCP - General (Internal Medicine) Chauncey Cruel, MD as Consulting Physician (Oncology) Fanny Skates, MD as Consulting Physician (General Surgery) Luberta Robertson, MD (Radiology) Hennie Duos, MD as Consulting Physician (Rheumatology) Domingo Pulse, MD (Urology) Druscilla Brownie, MD as Consulting Physician (Dermatology) PCP: Kandice Hams, MD GYN: OTHER MD:  CHIEF COMPLAINT: Locally advanced breast cancer  CURRENT TREATMENT: Neoadjuvant chemotherapy  BREAST CANCER HISTORY: From the original intake note:  [name] had routine mammographic screening in 08/18/2015 at the [facility], showing a right breast mass and possible axillary adenopathy. On 08/23/2015 she underwent bilateral diagnostic mammography with tomography and bilateral breast ultrasonography. The breast density was category B in the right breast there was a spiculated mass in the upper-outer quadrant measuring 1.2 cm and associated with pleomorphic calcifications. There were calcifications extending anteriorly and posteriorly from the mass worrisome for ductal carcinoma in situ. The total area in question is about 3 cm. There were also multiple enlarged right axillary lymph nodes. The mass was palpated as an area of fullness at the 12:00 position of the breast 3 cm from the nipple. There were palpable enlarged right axillary lymph nodes in the right axilla. Ultrasonography of the right breast confirmed an irregular hypoechoic mass in the area in question measuring 1.6 cm. There were multiple abnormal appearing enlarged right axillary lymph nodes, the largest measuring 2.3 cm.  In the left breast, mammography showed an area of architectural distortion associated with the left nipple. This was not palpable. Ultrasonography of the left  breast found no correlate. This area of the left breast was biopsied under tomography 08/24/2015. This showed (SAA [id number]) a complex sclerosing lesion and fibroadenoma.  On 08/23/2015 the patient underwent biopsy of the right breast mass in 1 of the abnormal right axillary lymph nodes. 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 Both biopsies were HER-2 nonamplified, the breast being ratio 1.27 and number per cell 1.90, and the lymph node 1 ratio 1.24 and number per cell 3.0.  On 08/26/2015 the patient underwent bilateral breast MRI. In the left breast there was no suspicious enhancement. The left axilla was benign. In the right breast there was a mass described as in the upper inner quadrant measuring 2.3 cm. Less than a centimeter away from this mass there was a satellite nodule measuring 0.9 cm. In the right axilla there were multiple enlarged right axillary lymph nodes, including a confluent region measuring up to 4 cm. There were also enlarged level II lymph nodes present measuring up to 2.1 cm.  The patient's subsequent history is as detailed below  INTERVAL HISTORY: [name] returns today for follow up of her breast cancer. He is currently day 8 cycle 2 of 4 planned cycles of cyclophosphamide and docetaxel, repeated every 21 days. Her family was unable to come with her because her husband had to be at [place], and both her daughters are dealing with work problems.  [name] did well for the first couple of days but then after after day 3 it was horrible" and that we can in particular was "poodle". Her legs felt like cement. Her fibromyalgia pain take 10, and she took Vicodin for this of course  as she usually does. This worsened the constipation problem.She had nausea but no vomiting. She has a very poor sense of taste and says she's eating out of  the periodic table not the food table. She had some mouth sores. He developed" rib roaring" vaginal [place], for which she started Diflucan yesterday. She is alternating hot flashes and chills. She had a temperature up to 97.7, which is high for her.   REVIEW OF SYSTEMS: In addition she developed a little bit of a rash near the area of her port, also under the left breast, and around the right ear. Aside from these issues a detailed review of systems today was noncontributory  PAST MEDICAL HISTORY: Past Medical History  Diagnosis Date  . Osteoporosis   . Menopausal symptoms   . Chronic vaginitis   . Fibromyalgia   . Interstitial cystitis   . Thyroid disease     hypothyroid  . Hypoglycemia   . Asthma   . PONV (postoperative nausea and vomiting)   . Cancer [facility])     breast cancer  . Pneumonia   . Depression     PAST SURGICAL HISTORY: Past Surgical History  Procedure Laterality Date  . Nasal sinus surgery      X4  . Bladder hyperextension      X4  . Bunionectomy    . Leg surgery  06/2004    Broken tibia  . Cholecystectomy  01/2006  . Cesarean section      DR. INGRAM  . Mouth surgery    . Dental surgery      13 dental implants  . Colonoscopy    . Portacath placement Left 09/08/2015    Procedure: INSERTION OF PORT-A-CATH ;  Surgeon: Fanny Skates, MD;  Location: [facility] OR;  Service: General;  Laterality: Left;    FAMILY HISTORY Family History  Problem Relation Age of Onset  . Hypertension Mother   . Heart disease Mother   . Parkinsonism Father   . Heart disease Sister   . Cancer Sister     MELANOMA  . Breast cancer Sister     Age 51  The patient's father died at age 34 from complications of Parkinson's disease. The patient's mother died age 42 with heart disease. [name] had no brothers. She had 2 sisters. One sister was diagnosed with breast cancer at age 62. She was tested for the BRCA gene and was negative. The same sister also had melanoma diagnosed in her 67s. The  patient has one maternal great aunt diagnosed with breast cancer around age 2. There is no history of ovarian cancer in the family  GYNECOLOGIC HISTORY:  No LMP recorded. Patient is postmenopausal. Menarche age 58, first live birth age 1. The patient is GX P2. She stopped having periods in her early 50s and took hormone replacement until her breast cancer diagnosis, April 2017.  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 He has been in remission for more than 10 years. Their daughter [name] lives in [place] were she is Production designer, theatre/television/film of the [employer]. Daughter [name] is an infectious disease M.D. working and teaching at [employer] in [place]. The patient has one granddaughter, 1 months old as of May 2017. The family attends [employer]: Not in place   HEALTH  MAINTENANCE: Social History  Substance Use Topics  . Smoking status: Former Smoker    Types: Cigarettes  . Smokeless tobacco: Never Used  . Alcohol Use: 0.0 oz/week    0 Standard drinks or equivalent per week     Comment: social     Colonoscopy: 2016/ [facility]  PAP: 2015  Bone density:   Lipid panel:  Allergies  Allergen Reactions  . Fluconazole     REACTION: elavated LFTs  . Nitrofurantoin Other (See Comments)    Unknown reaction  . Penicillins Other (See Comments)    Has patient had a PCN reaction causing immediate rash, facial/tongue/throat swelling, SOB or lightheadedness with hypotension: No Has patient had a PCN reaction causing severe rash involving mucus membranes or skin necrosis: No Has patient had a PCN reaction that required hospitalization No Has patient had a PCN reaction occurring within the last 10 years: No If all of the above answers are "NO", then may proceed with  Cephalosporin use.  . Other Rash    Adhesive tape    Current Outpatient Prescriptions  Medication Sig Dispense Refill  . azithromycin (ZITHROMAX) 250 MG tablet Take as package instructs 6 each 0  . buPROPion (WELLBUTRIN XL) 300 MG 24 hr tablet Take 300 mg by mouth daily.      . calcitonin, salmon, (MIACALCIN/FORTICAL) 200 UNIT/ACT nasal spray Place 1 spray into alternate nostrils daily. 3.7 mL 11  . Calcium Carbonate-Vitamin D (CALCIUM + D PO) Take 1 tablet by mouth daily.     . ciprofloxacin (CIPRO) 500 MG tablet Take 1 tablet (500 mg total) by mouth 2 (two) times daily. 10 tablet 0  . clotrimazole-betamethasone (LOTRISONE) cream Apply topically 2 (two) times daily. (Patient taking differently: Apply 1 application topically as needed. Apply to affected area as needed) 45 g 3  . dexamethasone (DECADRON) 4 MG tablet Take 2 tablets (8 mg total) by mouth 2 (two) times daily. Start the day before Taxotere. Then again the day after chemo for 3 days. 30 tablet 1  . diazepam (VALIUM) 5 MG tablet Take 5 mg by mouth every 6 (six) hours as needed for anxiety.     . fluconazole (DIFLUCAN) 100 MG tablet Take 1 tablet (100 mg total) by mouth daily. 20 tablet 0  . hydrOXYzine (ATARAX/VISTARIL) 25 MG tablet Take 25 mg by mouth 3 (three) times daily as needed for anxiety or itching.    Marland Kitchen ketoconazole (NIZORAL) 2 % cream Apply 1 application topically daily. 15 g 0  . Levothyroxine Sodium (SYNTHROID PO) Take 137 mcg by mouth daily.     Marland Kitchen lidocaine-prilocaine (EMLA) cream Apply to affected area once (Patient taking differently: Apply 1 application topically as needed. Apply to affected area once) 30 g 3  . LORazepam (ATIVAN) 0.5 MG tablet Take 1 tablet (0.5 mg total) by mouth at bedtime as needed (Nausea or vomiting). 30 tablet 0  . methylPREDNISolone (MEDROL DOSEPAK) 4 MG TBPK tablet Take as directed 1 tablet 0  . Multiple Vitamin (MULTIVITAMIN) capsule Take 1 capsule by mouth daily.      . nabumetone (RELAFEN)  750 MG tablet Take 750 mg by mouth 2 (two) times daily as needed for mild pain.    Marland Kitchen nystatin cream (MYCOSTATIN) Apply 1 application topically 2 (two) times daily. 30 g 0  . ondansetron (ZOFRAN) 8 MG tablet Take 1 tablet (8 mg total) by mouth 2 (two) times daily as needed for refractory nausea / vomiting. Start on day 3 after chemo. 30 tablet  1  . oxyCODONE-acetaminophen (PERCOCET) 7.5-325 MG tablet Take 1 tablet by mouth every 4 (four) hours as needed for severe pain. 30 tablet 0  . pantoprazole (PROTONIX) 40 MG tablet Take 40 mg by mouth daily.    . prochlorperazine (COMPAZINE) 10 MG tablet Take 1 tablet (10 mg total) by mouth every 6 (six) hours as needed (Nausea or vomiting). 30 tablet 1  . sulfamethoxazole-trimethoprim (BACTRIM DS,SEPTRA DS) 800-160 MG per tablet Take 1 tablet by mouth 2 (two) times daily. (Patient taking differently: Take 1 tablet by mouth as needed. ) 14 tablet 1  . terconazole (TERAZOL 7) 0.4 % vaginal cream Place 1 applicator vaginally at bedtime. (Patient taking differently: Place 1 applicator vaginally at bedtime as needed. ) 45 g 1  . tobramycin (TOBREX) 0.3 % ophthalmic solution Place 1-2 drops into the right eye 4 (four) times daily. X 5 days (Patient taking differently: Place 1-2 drops into the right eye 4 (four) times daily as needed. ) 5 mL 0  . Triamterene-HCTZ (DYAZIDE PO) Take 1 tablet by mouth daily.     Marland Kitchen triamterene-hydrochlorothiazide (MAXZIDE) 75-50 MG tablet Take 1 tablet by mouth daily.    . valACYclovir (VALTREX) 500 MG tablet Take 1 tablet (500 mg total) by mouth daily. 30 tablet 1  . Vitamin D, Ergocalciferol, (DRISDOL) 50000 UNITS CAPS capsule One tablet every other week (Patient taking differently: Take 50,000 Units by mouth every 14 (fourteen) days. One tablet every other week) 10 capsule 3  . zolpidem (AMBIEN) 10 MG tablet Take 10 mg by mouth at bedtime as needed for sleep.      No current facility-administered medications for this visit.     OBJECTIVE: Middle-aged white woman who looks stated age  32 Vitals:   10/10/15 1336  BP: 139/80  Pulse: 77  Temp: 98.4 F (36.9 C)  Resp: 18     Body mass index is 30.1 kg/(m^2).    ECOG FS:1 - Symptomatic but completely ambulatory  Sclerae unicteric, EOMs intact Oropharynx clear, dentition in good repair No cervical or supraclavicular adenopathy Lungs no rales or rhonchi Heart regular rate and rhythm Abd soft, nontender, positive bowel sounds MSK no focal spinal tenderness, no upper extremity lymphedema Neuro: nonfocal, well oriented, appropriate affect Breasts: No palpable masses noted. Both axillae are benign Skin: There is a rash under both breasts that looks very much like a fungal rash. There is a small area of erythema surrounding the port. There is also a little bit of a rash, somewhat irregularly,in the area behind and below the left ear. None of this is palpable.   LAB RESULTS:  CMP     Component Value Date/Time   NA 137 10/03/2015 1130   K 4.3 10/03/2015 1130   CO2 26 10/03/2015 1130   GLUCOSE 96 10/03/2015 1130   BUN 14.1 10/03/2015 1130   CREATININE 0.8 10/03/2015 1130   CALCIUM 9.5 10/03/2015 1130   PROT 7.2 10/03/2015 1130   ALBUMIN 3.6 10/03/2015 1130   AST 12 10/03/2015 1130   ALT 14 10/03/2015 1130   ALKPHOS 76 10/03/2015 1130   BILITOT 0.44 10/03/2015 1130    INo results found for: SPEP, UPEP  Lab Results  Component Value Date   WBC 4.0 10/10/2015   NEUTROABS 1.3* 10/10/2015   HGB 12.5 10/10/2015   HCT 36.9 10/10/2015   MCV 89.8 10/10/2015   PLT 108* 10/10/2015      Chemistry      Component Value Date/Time   NA 137  10/03/2015 1130   K 4.3 10/03/2015 1130   CO2 26 10/03/2015 1130   BUN 14.1 10/03/2015 1130   CREATININE 0.8 10/03/2015 1130      Component Value Date/Time   CALCIUM 9.5 10/03/2015 1130   ALKPHOS 76 10/03/2015 1130   AST 12 10/03/2015 1130   ALT 14 10/03/2015 1130   BILITOT 0.44 10/03/2015 1130       No  results found for: LABCA2  No components found for: LABCA125  No results for input(s): INR in the last 168 hours.  Urinalysis No results found for: COLORURINE, APPEARANCEUR, LABSPEC, PHURINE, GLUCOSEU, HGBUR, BILIRUBINUR, KETONESUR, PROTEINUR, UROBILINOGEN, NITRITE, LEUKOCYTESUR    ELIGIBLE FOR AVAILABLE RESEARCH PROTOCOL: no  STUDIES: Mm Digital Diagnostic Unilat R  09/13/2015  CLINICAL DATA:  Status post MRI-guided biopsy of a suspicious enhancing mass in the right breast earlier today. Recently diagnosed right breast cancer within the upper inner quadrant. Subsequent MRI showed an additional suspicious enhancing satellite mass within the inner right breast, slightly medial and inferior to the biopsy-proven cancer. The additional satellite mass was biopsied today. EXAM: DIAGNOSTIC RIGHT MAMMOGRAM POST MRI BIOPSY COMPARISON:  Previous exam(s). FINDINGS: Mammographic images were obtained following MRI guided biopsy of the right breast mass located slightly medial and inferior to patient's biopsy-proven cancer. At the conclusion of this procedure, a barbell shaped tissue marker was deployed in the biopsy cavity. This clip has been displaced approximately 10 mm anteriorly by a post biopsy hematoma. IMPRESSION: Postprocedure mammogram for clip placement. BARBELL SHAPED CLIP placed today after biopsy of the satellite mass located slightly medial and inferior to the biopsy-proven right breast cancer. Today's CLIP IS DISPLACED 10 MM ANTERIORLY by an associated post biopsy hematoma. Ribbon shaped clip within the upper right breast was placed at the time of the earlier biopsy, denoting the site of patient's known biopsy-proven cancer. Final Assessment: Post Procedure Mammograms for Marker Placement Electronically Signed   By: Franki Cabot M.D.   On: 09/13/2015 09:35   Mr Rt Breast Bx Johnella Moloney Dev 1st Lesion Image Bx Spec Mr Guide  09/14/2015  ADDENDUM REPORT: 09/14/2015 15:20 ADDENDUM: Pathology revealed  GRADE II INVASIVE AND IN SITU DUCTAL CARCINOMA of the inner Right breast. This was found to be concordant by Dr. Franki Cabot. Pathology results were discussed with the patient by telephone. The patient reported doing well after the biopsy with tenderness at the site. Post biopsy instructions and care were reviewed and questions were answered. The patient was encouraged to call [facility] for any additional concerns. The patient has a recent diagnosis of additional right breast cancer, slightly superior and lateral to this biopsy site, and should follow her outlined treatment plan. Please note that the barbell shaped clip placed at the time of this biopsy is DISPLACED 10 MM ANTERIORLY by an associated post biopsy hematoma. This may affect preoperative localization if breast conservation therapy is desired. Pathology results reported by Terie Purser, RN on 09/14/2015. Electronically Signed   By: Franki Cabot M.D.   On: 09/14/2015 15:20  09/14/2015  CLINICAL DATA:  Recent biopsy-proven cancer within the upper right breast. Subsequent breast MRI showing an additional suspicious enhancing satellite mass located slightly medial and inferior to the biopsy-proven right breast cancer. Patient presents today for MRI-guided core biopsy of this additional satellite mass. EXAM: MRI GUIDED CORE NEEDLE BIOPSY OF THE RIGHT BREAST TECHNIQUE: Multiplanar, multisequence MR imaging of the right breast was performed both before and after administration of intravenous contrast.  CONTRAST:  67m MULTIHANCE GADOBENATE DIMEGLUMINE 529 MG/ML IV SOLN COMPARISON:  Previous exams. FINDINGS: I met with the patient, and we discussed the procedure of MRI guided biopsy, including risks, benefits, and alternatives. Specifically, we discussed the risks of infection, bleeding, tissue injury, clip migration, and inadequate sampling. Informed, written consent was given. The usual time out protocol was performed immediately  prior to the procedure. Using sterile technique, 1% Lidocaine, MRI guidance, and a 9 gauge vacuum assisted device, biopsy was performed of the additional enhancing satellite mass in the right breast, located slightly medial and inferior to the biopsy-proven right breast cancer, using a medial approach. At the conclusion of the procedure, a barbell shaped tissue marker clip was deployed into the biopsy cavity. Follow-up 2-view mammogram was performed and dictated separately. IMPRESSION: MRI guided biopsy of the additional satellite mass in the right breast, located slightly medial and inferior to the biopsy-proven cancer in the upper right breast. No apparent complications. Electronically Signed: By: SFranki CabotM.D. On: 09/13/2015 09:56    ASSESSMENT: 70y.o. [place] woman status post right breast upper outer quadrant and right axillary lymph node biopsy 08/23/2015, both positive for a clinical T1 N2, stage IIIA invasive ductal carcinoma, grade 2, strongly estrogen receptor positive, 5% progesterone receptor positive, with an MIB-120-30% and no HER-2 amplification  (a) biopsy of a 0.9 cm Right breast satellite nodule 09/13/2015 showed invasive ductal carcinoma, grade 2, E-cadherin strongly positive, with a prognostic panel identical to the larger right upper quadrant mass  (1) left breast biopsy 08/24/2015 shows a complex sclerosing lesion  (2) neoadjuvant chemotherapy consisting of cyclophosphamide and docetaxel every 21 days 4, with OnPro support, starting 09/12/2015  (3) definitive surgery to follow, likely including full axillary lymph node dissection  (4) adjuvant radiation to follow surgery  (5) anti-estrogens for 10 years to follow at the completion of local treatment-- consider PALLAS trial  (6) genetics testing pending (Askenazi descent)  PLAN: LPorschadid considerably better with her second cycle of chemotherapy than with her first. Complicated this cycle is a flareup of her  fibromyalgia. She usually takes hydrocodone/APAP for that, which her primary care physician prescribes for her. Today she requested someoxycodone/APAP to kind of "get through" the next few weeks. I went ahead and wrote her for a total of 30 tablets with no refills. I think the rash beneath her breasts as well as the vaginal rashis clearly fungal. She hassome Diflucan [I went ahead and wrote for a refill. She will let me know in 2 or 3 days if the rash is not better in which case I will write her for another Medrol Dosepak.  Even though she does not like taking MiraLAX it is working for her and I encouraged her tocontinue. She does not need to repeat the constipation episode she had the first time.  She will see me again on the 26th. She will have her third cycle of chemotherapy on the same day. In between she knows to call for any fever, bleeding, worsening rash, or any other significant problem   MChauncey Cruel MD

## 2015-10-10 NOTE — Telephone Encounter (Signed)
per pof to sch pt trmt-gave pt copy of avs °

## 2015-10-11 ENCOUNTER — Other Ambulatory Visit: Payer: Self-pay

## 2015-10-11 DIAGNOSIS — C50411 Malignant neoplasm of upper-outer quadrant of right female breast: Secondary | ICD-10-CM

## 2015-10-11 MED ORDER — METHYLPREDNISOLONE 4 MG PO TBPK: ORAL_TABLET | ORAL | Status: DC

## 2015-10-11 MED ORDER — VALACYCLOVIR HCL 500 MG PO TABS: 500.0000 mg | ORAL_TABLET | Freq: Two times a day (BID) | ORAL | Status: DC

## 2015-10-11 NOTE — Progress Notes (Signed)
I confirmed with pt that she is taking Valtrex BID per Dr. Virgie Dad instructions.  I advised pt the problem with the Valtrex refill was insurance thinking it was too early to fill as her prior prescription was written for daily.  I sent a new prescription for Valtrex 500 mg BID and let pt know I has spoken with the pharmacist.  She voiced understanding.

## 2015-10-12 ENCOUNTER — Encounter: Payer: Self-pay | Admitting: Oncology

## 2015-10-12 NOTE — Progress Notes (Signed)
covermymeds--[facility]bs for prior auth hydroxyzine hcl

## 2015-10-12 NOTE — Progress Notes (Signed)
Mess left hydroxyzine was approved 10/12/15-10/11/16 and letter to come

## 2015-10-13 ENCOUNTER — Other Ambulatory Visit: Payer: Self-pay | Admitting: Oncology

## 2015-10-13 ENCOUNTER — Telehealth: Payer: Self-pay | Admitting: Oncology

## 2015-10-13 NOTE — Telephone Encounter (Signed)
spoke w/ pt confirmed 6/19 appt .. apt per pof

## 2015-10-17 ENCOUNTER — Ambulatory Visit (HOSPITAL_BASED_OUTPATIENT_CLINIC_OR_DEPARTMENT_OTHER): Payer: Medicare Other | Admitting: Oncology

## 2015-10-17 ENCOUNTER — Other Ambulatory Visit: Payer: Self-pay

## 2015-10-17 ENCOUNTER — Other Ambulatory Visit (HOSPITAL_BASED_OUTPATIENT_CLINIC_OR_DEPARTMENT_OTHER): Payer: Medicare Other

## 2015-10-17 VITALS — BP 141/76 | HR 77 | Temp 97.9°F | Resp 18 | Ht 63.0 in | Wt 169.5 lb

## 2015-10-17 DIAGNOSIS — C50411 Malignant neoplasm of upper-outer quadrant of right female breast: Secondary | ICD-10-CM

## 2015-10-17 DIAGNOSIS — M791 Myalgia: Secondary | ICD-10-CM

## 2015-10-17 DIAGNOSIS — R21 Rash and other nonspecific skin eruption: Secondary | ICD-10-CM | POA: Diagnosis not present

## 2015-10-17 DIAGNOSIS — C773 Secondary and unspecified malignant neoplasm of axilla and upper limb lymph nodes: Secondary | ICD-10-CM | POA: Diagnosis not present

## 2015-10-17 LAB — CBC WITH DIFFERENTIAL/PLATELET
BASO%: 0.3 % (ref 0.0–2.0)
BASOS ABS: 0 10*3/uL (ref 0.0–0.1)
EOS%: 0 % (ref 0.0–7.0)
Eosinophils Absolute: 0 10*3/uL (ref 0.0–0.5)
HEMATOCRIT: 37.3 % (ref 34.8–46.6)
HEMOGLOBIN: 12.7 g/dL (ref 11.6–15.9)
LYMPH#: 2.2 10*3/uL (ref 0.9–3.3)
LYMPH%: 20 % (ref 14.0–49.7)
MCH: 31.3 pg (ref 25.1–34.0)
MCHC: 34 g/dL (ref 31.5–36.0)
MCV: 91.9 fL (ref 79.5–101.0)
MONO#: 0.8 10*3/uL (ref 0.1–0.9)
MONO%: 7.4 % (ref 0.0–14.0)
NEUT#: 7.9 10*3/uL — ABNORMAL HIGH (ref 1.5–6.5)
NEUT%: 72.3 % (ref 38.4–76.8)
Platelets: 231 10*3/uL (ref 145–400)
RBC: 4.06 10*6/uL (ref 3.70–5.45)
RDW: 14.5 % (ref 11.2–14.5)
WBC: 10.9 10*3/uL — ABNORMAL HIGH (ref 3.9–10.3)

## 2015-10-17 LAB — COMPREHENSIVE METABOLIC PANEL
ALBUMIN: 3.5 g/dL (ref 3.5–5.0)
ALT: 16 U/L (ref 0–55)
AST: 13 U/L (ref 5–34)
Alkaline Phosphatase: 79 U/L (ref 40–150)
Anion Gap: 10 mEq/L (ref 3–11)
BILIRUBIN TOTAL: 0.49 mg/dL (ref 0.20–1.20)
BUN: 12.4 mg/dL (ref 7.0–26.0)
CALCIUM: 9 mg/dL (ref 8.4–10.4)
CO2: 26 mEq/L (ref 22–29)
CREATININE: 0.9 mg/dL (ref 0.6–1.1)
Chloride: 103 mEq/L (ref 98–109)
EGFR: 68 mL/min/{1.73_m2} — ABNORMAL LOW (ref >90)
Glucose: 106 mg/dl (ref 70–140)
Potassium: 4 mEq/L (ref 3.5–5.1)
Sodium: 138 mEq/L (ref 136–145)
Total Protein: 6.7 g/dL (ref 6.4–8.3)

## 2015-10-17 MED ORDER — TOBRAMYCIN 0.3 % OP SOLN: 1.0000 [drp] | Freq: Four times a day (QID) | OPHTHALMIC | Status: DC

## 2015-10-17 MED ORDER — METHYLPREDNISOLONE 4 MG PO TBPK: ORAL_TABLET | ORAL | Status: DC

## 2015-10-17 MED ORDER — CYCLOSPORINE 0.05 % OP EMUL: 1.0000 [drp] | Freq: Two times a day (BID) | OPHTHALMIC | Status: DC

## 2015-10-17 NOTE — Progress Notes (Signed)
[place]  [phone] [phone]) [phone]     ID: [name] DOB: 1946/02/05  MR#: [id number]  JOA#:[id number]  Patient Care Team: Seward Carol, MD as PCP - General (Internal Medicine) Chauncey Cruel, MD as Consulting Physician (Oncology) Fanny Skates, MD as Consulting Physician (General Surgery) Luberta Robertson, MD (Radiology) Hennie Duos, MD as Consulting Physician (Rheumatology) Domingo Pulse, MD (Urology) Druscilla Brownie, MD as Consulting Physician (Dermatology) PCP: Kandice Hams, MD GYN: OTHER MD:  CHIEF COMPLAINT: Locally advanced breast cancer  CURRENT TREATMENT: Neoadjuvant chemotherapy  BREAST CANCER HISTORY: From the original intake note:  [name] had routine mammographic screening in 08/18/2015 at the [facility], showing a right breast mass and possible axillary adenopathy. On 08/23/2015 she underwent bilateral diagnostic mammography with tomography and bilateral breast ultrasonography. The breast density was category B in the right breast there was a spiculated mass in the upper-outer quadrant measuring 1.2 cm and associated with pleomorphic calcifications. There were calcifications extending anteriorly and posteriorly from the mass worrisome for ductal carcinoma in situ. The total area in question is about 3 cm. There were also multiple enlarged right axillary lymph nodes. The mass was palpated as an area of fullness at the 12:00 position of the breast 3 cm from the nipple. There were palpable enlarged right axillary lymph nodes in the right axilla. Ultrasonography of the right breast confirmed an irregular hypoechoic mass in the area in question measuring 1.6 cm. There were multiple abnormal appearing enlarged right axillary lymph nodes, the largest measuring 2.3 cm.  In the left breast, mammography showed an area of architectural distortion associated with the left nipple. This was not palpable. Ultrasonography of the left  breast found no correlate. This area of the left breast was biopsied under tomography 08/24/2015. This showed (SAA [id number]) a complex sclerosing lesion and fibroadenoma.  On 08/23/2015 the patient underwent biopsy of the right breast mass in 1 of the abnormal right axillary lymph nodes. 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 Both biopsies were HER-2 nonamplified, the breast being ratio 1.27 and number per cell 1.90, and the lymph node 1 ratio 1.24 and number per cell 3.0.  On 08/26/2015 the patient underwent bilateral breast MRI. In the left breast there was no suspicious enhancement. The left axilla was benign. In the right breast there was a mass described as in the upper inner quadrant measuring 2.3 cm. Less than a centimeter away from this mass there was a satellite nodule measuring 0.9 cm. In the right axilla there were multiple enlarged right axillary lymph nodes, including a confluent region measuring up to 4 cm. There were also enlarged level II lymph nodes present measuring up to 2.1 cm.  The patient's subsequent history is as detailed below  INTERVAL HISTORY: [name] returns today for follow up of her breast cancer. She is currently day 15 cycle 2 of 4 planned cycles of cyclophosphamide and docetaxel, repeated every 21 days. She again developed a rash with this second cycle of chemotherapy, as she did with the first. This time we went directly to a Medrol Dosepak. This worked very well. Her rash is just about resolved.  REVIEW OF SYSTEMS: [name] has a history of seasonal allergies and she is having seasonal allergy symptoms at present. However Taxotere can cause similar symptoms. In addition she is having tearing, which is  a new problem. Likely this is due to the Taxotere as well. She was on TobraDex previously but has run out. Aside  from these symptoms, her fibromyalgia has been really taking in with the changes in the weather. She has had more pain than usual. A detailed review of systems today was otherwise non-contributory  PAST MEDICAL HISTORY: Past Medical History  Diagnosis Date  . Osteoporosis   . Menopausal symptoms   . Chronic vaginitis   . Fibromyalgia   . Interstitial cystitis   . Thyroid disease     hypothyroid  . Hypoglycemia   . Asthma   . PONV (postoperative nausea and vomiting)   . Cancer [facility])     breast cancer  . Pneumonia   . Depression     PAST SURGICAL HISTORY: Past Surgical History  Procedure Laterality Date  . Nasal sinus surgery      X4  . Bladder hyperextension      X4  . Bunionectomy    . Leg surgery  06/2004    Broken tibia  . Cholecystectomy  01/2006  . Cesarean section      DR. INGRAM  . Mouth surgery    . Dental surgery      13 dental implants  . Colonoscopy    . Portacath placement Left 09/08/2015    Procedure: INSERTION OF PORT-A-CATH ;  Surgeon: Fanny Skates, MD;  Location: [facility] OR;  Service: General;  Laterality: Left;    FAMILY HISTORY Family History  Problem Relation Age of Onset  . Hypertension Mother   . Heart disease Mother   . Parkinsonism Father   . Heart disease Sister   . Cancer Sister     MELANOMA  . Breast cancer Sister     Age 41  The patient's father died at age 73 from complications of Parkinson's disease. The patient's mother died age 61 with heart disease. [name] had no brothers. She had 2 sisters. One sister was diagnosed with breast cancer at age 86. She was tested for the BRCA gene and was negative. The same sister also had melanoma diagnosed in her 38s. The patient has one maternal great aunt diagnosed with breast cancer around age 75. There is no history of ovarian cancer in the family  GYNECOLOGIC HISTORY:  No LMP recorded. Patient is postmenopausal. Menarche age 29, first live birth age 10. The patient is GX P2. She stopped having  periods in her early 59s and took hormone replacement until her breast cancer diagnosis, April 2017.  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 He has been in remission for more than 10 years. Their daughter [name] lives in [place] were she is Production designer, theatre/television/film of the [employer]. Daughter [name] is an infectious disease M.D. working and teaching at [employer] in [place]. The patient has one granddaughter, 58 months old as of May 2017. The family attends [employer]: Not in place   HEALTH MAINTENANCE: Social History  Substance Use Topics  . Smoking status: Former Smoker    Types: Cigarettes  . Smokeless tobacco: Never Used  . Alcohol Use: 0.0 oz/week    0 Standard drinks or equivalent per week     Comment: social     Colonoscopy: 2016/ [facility]  PAP: 2015  Bone density:  Lipid panel:  Allergies  Allergen Reactions  . Fluconazole     REACTION: elavated LFTs  . Nitrofurantoin Other (See Comments)    Unknown reaction  . Penicillins Other (See Comments)    Has patient had a PCN reaction causing immediate rash, facial/tongue/throat swelling, SOB or lightheadedness with hypotension: No Has patient had a PCN reaction causing severe rash involving mucus membranes or skin necrosis: No Has patient had a PCN reaction that required hospitalization No Has patient had a PCN reaction occurring within the last 10 years: No If all of the above answers are "NO", then may proceed with Cephalosporin use.  . Other Rash    Adhesive tape    Current Outpatient Prescriptions  Medication Sig Dispense Refill  . buPROPion (WELLBUTRIN XL) 300 MG 24 hr tablet Take 300 mg by mouth daily.      . calcitonin, salmon, (MIACALCIN/FORTICAL) 200 UNIT/ACT nasal spray Place 1  spray into alternate nostrils daily. 3.7 mL 11  . Calcium Carbonate-Vitamin D (CALCIUM + D PO) Take 1 tablet by mouth daily.     Marland Kitchen dexamethasone (DECADRON) 4 MG tablet Take 2 tablets (8 mg total) by mouth 2 (two) times daily. Start the day before Taxotere. Then again the day after chemo for 3 days. 30 tablet 1  . diazepam (VALIUM) 5 MG tablet Take 5 mg by mouth every 6 (six) hours as needed for anxiety.     . fluconazole (DIFLUCAN) 100 MG tablet Take 1 tablet (100 mg total) by mouth daily. 20 tablet 0  . hydrOXYzine (ATARAX/VISTARIL) 25 MG tablet Take 1 tablet (25 mg total) by mouth 3 (three) times daily as needed for anxiety or itching. 30 tablet 0  . ketoconazole (NIZORAL) 2 % cream Apply 1 application topically daily. 15 g 0  . Levothyroxine Sodium (SYNTHROID PO) Take 137 mcg by mouth daily.     Marland Kitchen lidocaine-prilocaine (EMLA) cream Apply to affected area once (Patient taking differently: Apply 1 application topically as needed. Apply to affected area once) 30 g 3  . LORazepam (ATIVAN) 0.5 MG tablet Take 1 tablet (0.5 mg total) by mouth at bedtime as needed (Nausea or vomiting). 30 tablet 0  . methylPREDNISolone (MEDROL DOSEPAK) 4 MG TBPK tablet Take as directed 1 tablet 0  . Multiple Vitamin (MULTIVITAMIN) capsule Take 1 capsule by mouth daily.      . nabumetone (RELAFEN) 750 MG tablet Take 750 mg by mouth 2 (two) times daily as needed for mild pain.    Marland Kitchen nystatin cream (MYCOSTATIN) Apply 1 application topically 2 (two) times daily. 30 g 0  . ondansetron (ZOFRAN) 8 MG tablet Take 1 tablet (8 mg total) by mouth 2 (two) times daily as needed for refractory nausea / vomiting. Start on day 3 after chemo. 30 tablet 1  . oxyCODONE-acetaminophen (PERCOCET/ROXICET) 5-325 MG tablet Take 1 tablet by mouth every 8 (eight) hours as needed for severe pain. 30 tablet 0  . pantoprazole (PROTONIX) 40 MG tablet Take 40 mg by mouth daily.    . prochlorperazine (COMPAZINE) 10 MG tablet Take 1 tablet (10 mg total) by  mouth every 6 (six) hours as needed (Nausea or vomiting). 30 tablet 1  . tobramycin (TOBREX) 0.3 % ophthalmic solution Place 1-2 drops into the right eye 4 (four) times daily. X 5 days (Patient taking differently: Place 1-2 drops into the right eye 4 (four) times daily as needed. ) 5 mL 0  . Triamterene-HCTZ (DYAZIDE PO) Take 1 tablet by mouth daily.     Marland Kitchen  valACYclovir (VALTREX) 500 MG tablet Take 1 tablet (500 mg total) by mouth 2 (two) times daily. 60 tablet 0  . Vitamin D, Ergocalciferol, (DRISDOL) 50000 UNITS CAPS capsule One tablet every other week (Patient taking differently: Take 50,000 Units by mouth every 14 (fourteen) days. One tablet every other week) 10 capsule 3  . zolpidem (AMBIEN) 10 MG tablet Take 10 mg by mouth at bedtime as needed for sleep.      No current facility-administered medications for this visit.    OBJECTIVE: Middle-aged white woman In no acute distress Filed Vitals:   10/17/15 1548  BP: 141/76  Pulse: 77  Temp: 97.9 F (36.6 C)  Resp: 18     Body mass index is 30.03 kg/(m^2).    ECOG FS:1 - Symptomatic but completely ambulatory  Sclerae unicteric, pupils round and equal Oropharynx clear and moist-- no thrush or other lesions No cervical or supraclavicular adenopathy Lungs no rales or rhonchi Heart regular rate and rhythm Abd soft, nontender, positive bowel sounds MSK no focal spinal tenderness, no upper extremity lymphedema Neuro: nonfocal, well oriented, appropriate affect Breasts: Deferred Skin: The fungal like rash she had previously is no longer apparent.  LAB RESULTS:  CMP     Component Value Date/Time   NA 138 10/17/2015 1523   K 4.0 10/17/2015 1523   CO2 26 10/17/2015 1523   GLUCOSE 106 10/17/2015 1523   BUN 12.4 10/17/2015 1523   CREATININE 0.9 10/17/2015 1523   CALCIUM 9.0 10/17/2015 1523   PROT 6.7 10/17/2015 1523   ALBUMIN 3.5 10/17/2015 1523   AST 13 10/17/2015 1523   ALT 16 10/17/2015 1523   ALKPHOS 79 10/17/2015 1523    BILITOT 0.49 10/17/2015 1523    INo results found for: SPEP, UPEP  Lab Results  Component Value Date   WBC 10.9* 10/17/2015   NEUTROABS 7.9* 10/17/2015   HGB 12.7 10/17/2015   HCT 37.3 10/17/2015   MCV 91.9 10/17/2015   PLT 231 10/17/2015      Chemistry      Component Value Date/Time   NA 138 10/17/2015 1523   K 4.0 10/17/2015 1523   CO2 26 10/17/2015 1523   BUN 12.4 10/17/2015 1523   CREATININE 0.9 10/17/2015 1523      Component Value Date/Time   CALCIUM 9.0 10/17/2015 1523   ALKPHOS 79 10/17/2015 1523   AST 13 10/17/2015 1523   ALT 16 10/17/2015 1523   BILITOT 0.49 10/17/2015 1523       No results found for: LABCA2  No components found for: LABCA125  No results for input(s): INR in the last 168 hours.  Urinalysis No results found for: COLORURINE, APPEARANCEUR, LABSPEC, PHURINE, GLUCOSEU, HGBUR, BILIRUBINUR, KETONESUR, PROTEINUR, UROBILINOGEN, NITRITE, LEUKOCYTESUR    ELIGIBLE FOR AVAILABLE RESEARCH PROTOCOL: no  STUDIES: No results found.  ASSESSMENT: 70 y.o. [name] woman status post right breast upper outer quadrant and right axillary lymph node biopsy 08/23/2015, both positive for a clinical T1 N2, stage IIIA invasive ductal carcinoma, grade 2, strongly estrogen receptor positive, 5% progesterone receptor positive, with an MIB-120-30% and no HER-2 amplification  (a) biopsy of a 0.9 cm Right breast satellite nodule 09/13/2015 showed invasive ductal carcinoma, grade 2, E-cadherin strongly positive, with a prognostic panel identical to the larger right upper quadrant mass  (1) left breast biopsy 08/24/2015 shows a complex sclerosing lesion  (2) neoadjuvant chemotherapy consisting of cyclophosphamide and docetaxel every 21 days 4, with OnPro support, starting 09/12/2015  (3) definitive surgery to follow, likely  including full axillary lymph node dissection  (4) adjuvant radiation to follow surgery  (5) anti-estrogens for 10 years to follow at the  completion of local treatment-- consider PALLAS trial  (6) genetics testing pending (Askenazi descent)  PLAN: Dolores is doing remarkably well and I don't expect any problems going on to cycle 3 next week.  We are going to have to anticipate the rash which she has had both times with the first 2 doses and I have refilled her Medrol Dosepak so she can started as soon as she notices the rash starting of course she receives Decadron the first couple of days after chemotherapy she likely will not needed until after that.  She is beginning to have some tearing. The TobraDex may not be enough. I have written a prescription for Restasis to see if we can keep the tear ducts open so she does not have to have Localization Later.  Her Labs Today Are Excellent. If She Needs Root Canals Tomorrow I Don't Think That Will Be a Problem.  She Knows to Call for Any Other Issues That May Develop before Her Next Visit Here   Chauncey Cruel, MD

## 2015-10-20 ENCOUNTER — Encounter: Payer: Self-pay | Admitting: Oncology

## 2015-10-20 NOTE — Progress Notes (Signed)
I sent mess to dr Jana Hakim and val to see what has been tried and failed for prior auth for restasis

## 2015-10-24 ENCOUNTER — Ambulatory Visit (HOSPITAL_BASED_OUTPATIENT_CLINIC_OR_DEPARTMENT_OTHER): Payer: Medicare Other

## 2015-10-24 ENCOUNTER — Encounter: Payer: Self-pay | Admitting: *Deleted

## 2015-10-24 ENCOUNTER — Telehealth: Payer: Self-pay | Admitting: Oncology

## 2015-10-24 ENCOUNTER — Other Ambulatory Visit (HOSPITAL_BASED_OUTPATIENT_CLINIC_OR_DEPARTMENT_OTHER): Payer: Medicare Other

## 2015-10-24 ENCOUNTER — Ambulatory Visit (HOSPITAL_BASED_OUTPATIENT_CLINIC_OR_DEPARTMENT_OTHER): Payer: Medicare Other | Admitting: Oncology

## 2015-10-24 VITALS — BP 139/80 | HR 81 | Temp 98.3°F | Resp 18 | Ht 63.0 in | Wt 172.2 lb

## 2015-10-24 DIAGNOSIS — C773 Secondary and unspecified malignant neoplasm of axilla and upper limb lymph nodes: Secondary | ICD-10-CM

## 2015-10-24 DIAGNOSIS — C50411 Malignant neoplasm of upper-outer quadrant of right female breast: Secondary | ICD-10-CM

## 2015-10-24 DIAGNOSIS — Z5111 Encounter for antineoplastic chemotherapy: Secondary | ICD-10-CM

## 2015-10-24 DIAGNOSIS — M2550 Pain in unspecified joint: Secondary | ICD-10-CM | POA: Diagnosis not present

## 2015-10-24 DIAGNOSIS — R21 Rash and other nonspecific skin eruption: Secondary | ICD-10-CM | POA: Diagnosis not present

## 2015-10-24 DIAGNOSIS — Z170 Estrogen receptor positive status [ER+]: Secondary | ICD-10-CM

## 2015-10-24 LAB — CBC WITH DIFFERENTIAL/PLATELET
BASO%: 0.1 % (ref 0.0–2.0)
BASOS ABS: 0 10*3/uL (ref 0.0–0.1)
EOS ABS: 0 10*3/uL (ref 0.0–0.5)
EOS%: 0 % (ref 0.0–7.0)
HEMATOCRIT: 34.6 % — AB (ref 34.8–46.6)
HEMOGLOBIN: 11.6 g/dL (ref 11.6–15.9)
LYMPH#: 0.6 10*3/uL — AB (ref 0.9–3.3)
LYMPH%: 5.6 % — ABNORMAL LOW (ref 14.0–49.7)
MCH: 31.1 pg (ref 25.1–34.0)
MCHC: 33.7 g/dL (ref 31.5–36.0)
MCV: 92.2 fL (ref 79.5–101.0)
MONO#: 0.3 10*3/uL (ref 0.1–0.9)
MONO%: 2.5 % (ref 0.0–14.0)
NEUT#: 9.4 10*3/uL — ABNORMAL HIGH (ref 1.5–6.5)
NEUT%: 91.8 % — ABNORMAL HIGH (ref 38.4–76.8)
Platelets: 365 10*3/uL (ref 145–400)
RBC: 3.75 10*6/uL (ref 3.70–5.45)
RDW: 14.6 % — AB (ref 11.2–14.5)
WBC: 10.2 10*3/uL (ref 3.9–10.3)

## 2015-10-24 LAB — COMPREHENSIVE METABOLIC PANEL
ALBUMIN: 3.6 g/dL (ref 3.5–5.0)
ALK PHOS: 59 U/L (ref 40–150)
ALT: 17 U/L (ref 0–55)
AST: 12 U/L (ref 5–34)
Anion Gap: 11 mEq/L (ref 3–11)
BILIRUBIN TOTAL: 0.45 mg/dL (ref 0.20–1.20)
BUN: 14.4 mg/dL (ref 7.0–26.0)
CALCIUM: 9.4 mg/dL (ref 8.4–10.4)
CO2: 24 mEq/L (ref 22–29)
Chloride: 101 mEq/L (ref 98–109)
Creatinine: 0.8 mg/dL (ref 0.6–1.1)
EGFR: 77 mL/min/{1.73_m2} — AB (ref >90)
Glucose: 133 mg/dl (ref 70–140)
POTASSIUM: 4.1 meq/L (ref 3.5–5.1)
Sodium: 137 mEq/L (ref 136–145)
Total Protein: 7 g/dL (ref 6.4–8.3)

## 2015-10-24 MED ORDER — SODIUM CHLORIDE 0.9 % IV SOLN
Freq: Once | INTRAVENOUS | Status: AC
  Administered 2015-10-24: 14:00:00 via INTRAVENOUS

## 2015-10-24 MED ORDER — HEPARIN SOD (PORK) LOCK FLUSH 100 UNIT/ML IV SOLN
500.0000 [IU] | Freq: Once | INTRAVENOUS | Status: AC | PRN
  Administered 2015-10-24: 500 [IU]
  Filled 2015-10-24: qty 5

## 2015-10-24 MED ORDER — SODIUM CHLORIDE 0.9 % IV SOLN
75.0000 mg/m2 | Freq: Once | INTRAVENOUS | Status: AC
  Administered 2015-10-24: 140 mg via INTRAVENOUS
  Filled 2015-10-24: qty 14

## 2015-10-24 MED ORDER — PEGFILGRASTIM 6 MG/0.6ML ~~LOC~~ PSKT
6.0000 mg | PREFILLED_SYRINGE | Freq: Once | SUBCUTANEOUS | Status: AC
  Administered 2015-10-24: 6 mg via SUBCUTANEOUS
  Filled 2015-10-24: qty 0.6

## 2015-10-24 MED ORDER — SODIUM CHLORIDE 0.9% FLUSH
10.0000 mL | INTRAVENOUS | Status: DC | PRN
  Administered 2015-10-24: 10 mL
  Filled 2015-10-24: qty 10

## 2015-10-24 MED ORDER — SODIUM CHLORIDE 0.9 % IV SOLN
600.0000 mg/m2 | Freq: Once | INTRAVENOUS | Status: AC
  Administered 2015-10-24: 1120 mg via INTRAVENOUS
  Filled 2015-10-24: qty 56

## 2015-10-24 MED ORDER — PALONOSETRON HCL INJECTION 0.25 MG/5ML
INTRAVENOUS | Status: AC
  Filled 2015-10-24: qty 5

## 2015-10-24 MED ORDER — PALONOSETRON HCL INJECTION 0.25 MG/5ML
0.2500 mg | Freq: Once | INTRAVENOUS | Status: AC
  Administered 2015-10-24: 0.25 mg via INTRAVENOUS

## 2015-10-24 MED ORDER — METHYLPREDNISOLONE 4 MG PO TBPK: ORAL_TABLET | ORAL | Status: DC

## 2015-10-24 MED ORDER — SODIUM CHLORIDE 0.9 % IV SOLN
4.0000 mg | Freq: Once | INTRAVENOUS | Status: AC
  Administered 2015-10-24: 4 mg via INTRAVENOUS
  Filled 2015-10-24: qty 0.4

## 2015-10-24 NOTE — Telephone Encounter (Signed)
appt made and avs printed °

## 2015-10-24 NOTE — Progress Notes (Signed)
[name]  [phone] [phone]     ID: [name] DOB: Feb 07, 1946  MR#: [id number]  JYN#:[id number]  Patient Care Team: Seward Carol, MD as PCP - General (Internal Medicine) Chauncey Cruel, MD as Consulting Physician (Oncology) Fanny Skates, MD as Consulting Physician (General Surgery) Luberta Robertson, MD (Radiology) Hennie Duos, MD as Consulting Physician (Rheumatology) Domingo Pulse, MD (Urology) Druscilla Brownie, MD as Consulting Physician (Dermatology) PCP: Kandice Hams, MD GYN: OTHER MD:  CHIEF COMPLAINT: Locally advanced breast cancer  CURRENT TREATMENT: Neoadjuvant chemotherapy  BREAST CANCER HISTORY: From the original intake note:  [name] had routine mammographic screening in 08/18/2015 at the [facility], showing a right breast mass and possible axillary adenopathy. On 08/23/2015 she underwent bilateral diagnostic mammography with tomography and bilateral breast ultrasonography. The breast density was category B in the right breast there was a spiculated mass in the upper-outer quadrant measuring 1.2 cm and associated with pleomorphic calcifications. There were calcifications extending anteriorly and posteriorly from the mass worrisome for ductal carcinoma in situ. The total area in question is about 3 cm. There were also multiple enlarged right axillary lymph nodes. The mass was palpated as an area of fullness at the 12:00 position of the breast 3 cm from the nipple. There were palpable enlarged right axillary lymph nodes in the right axilla. Ultrasonography of the right breast confirmed an irregular hypoechoic mass in the area in question measuring 1.6 cm. There were multiple abnormal appearing enlarged right axillary lymph nodes, the largest measuring 2.3 cm.  In the left breast, mammography showed an area of architectural distortion associated with the left nipple. This was not palpable. Ultrasonography of the left  breast found no correlate. This area of the left breast was biopsied under tomography 08/24/2015. This showed (SAA [id number]) a complex sclerosing lesion and fibroadenoma.  On 08/23/2015 the patient underwent biopsy of the right breast mass in 1 of the abnormal right axillary lymph nodes. 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 Both biopsies were HER-2 nonamplified, the breast being ratio 1.27 and number per cell 1.90, and the lymph node 1 ratio 1.24 and number per cell 3.0.  On 08/26/2015 the patient underwent bilateral breast MRI. In the left breast there was no suspicious enhancement. The left axilla was benign. In the right breast there was a mass described as in the upper inner quadrant measuring 2.3 cm. Less than a centimeter away from this mass there was a satellite nodule measuring 0.9 cm. In the right axilla there were multiple enlarged right axillary lymph nodes, including a confluent region measuring up to 4 cm. There were also enlarged level II lymph nodes present measuring up to 2.1 cm.  The patient's subsequent history is as detailed below  INTERVAL HISTORY: [name] returns today for follow up of her right-sided breast cancer. Today is day 1 cycle 3 of 4 planned cycles of cyclophosphamide and docetaxel, repeated every 21 days. She has obtained a very becoming weak and going with that she is generally feeling "great". The symptoms from her second cycle have completely resolved  REVIEW OF SYSTEMS: [name] gets very constipated, get some thrush, and gets a rash responsive only to a Medrol Dosepak with each treatment. By now she is quite aware of this and is preparing to prophylax for all the specific problems. She does feel  somewhat fatigued and feels this is becoming cumulative. She gets cramps in her left leg when she gets treated. Her  asthma is stable. Her joint pain is also stable and well-controlled on her current pain medicines. A detailed review of systems today was otherwise noncontributory  PAST MEDICAL HISTORY: Past Medical History  Diagnosis Date  . Osteoporosis   . Menopausal symptoms   . Chronic vaginitis   . Fibromyalgia   . Interstitial cystitis   . Thyroid disease     hypothyroid  . Hypoglycemia   . Asthma   . PONV (postoperative nausea and vomiting)   . Cancer [facility])     breast cancer  . Pneumonia   . Depression     PAST SURGICAL HISTORY: Past Surgical History  Procedure Laterality Date  . Nasal sinus surgery      X4  . Bladder hyperextension      X4  . Bunionectomy    . Leg surgery  06/2004    Broken tibia  . Cholecystectomy  01/2006  . Cesarean section      DR. INGRAM  . Mouth surgery    . Dental surgery      13 dental implants  . Colonoscopy    . Portacath placement Left 09/08/2015    Procedure: INSERTION OF PORT-A-CATH ;  Surgeon: Fanny Skates, MD;  Location: [facility] OR;  Service: General;  Laterality: Left;    FAMILY HISTORY Family History  Problem Relation Age of Onset  . Hypertension Mother   . Heart disease Mother   . Parkinsonism Father   . Heart disease Sister   . Cancer Sister     MELANOMA  . Breast cancer Sister     Age 21  The patient's father died at age 74 from complications of Parkinson's disease. The patient's mother died age 56 with heart disease. [name] had no brothers. She had 2 sisters. One sister was diagnosed with breast cancer at age 59. She was tested for the BRCA gene and was negative. The same sister also had melanoma diagnosed in her 41s. The patient has one maternal great aunt diagnosed with breast cancer around age 68. There is no history of ovarian cancer in the family  GYNECOLOGIC HISTORY:  No LMP recorded. Patient is postmenopausal. Menarche age 28, first live birth age 66. The patient is GX P2. She stopped having periods in her early 45s and took  hormone replacement until her breast cancer diagnosis, April 2017.  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 He has been in remission for more than 10 years. Their daughter Nira Conn lives in [place] were she is Production designer, theatre/television/film of the [employer]. Daughter Belenda Cruise is an infectious disease M.D. working and teaching at [employer] in [place]. The patient has one granddaughter, 18 months old as of May 2017. The family attends [place]: Not in place   HEALTH MAINTENANCE: Social History  Substance Use Topics  . Smoking status: Former Smoker    Types: Cigarettes  . Smokeless tobacco: Never Used  . Alcohol Use: 0.0 oz/week    0 Standard drinks or equivalent per week     Comment: social     Colonoscopy: 2016/ [facility]  PAP: 2015  Bone density:   Lipid panel:  Allergies  Allergen Reactions  .  Fluconazole     REACTION: elavated LFTs  . Nitrofurantoin Other (See Comments)    Unknown reaction  . Penicillins Other (See Comments)    Has patient had a PCN reaction causing immediate rash, facial/tongue/throat swelling, SOB or lightheadedness with hypotension: No Has patient had a PCN reaction causing severe rash involving mucus membranes or skin necrosis: No Has patient had a PCN reaction that required hospitalization No Has patient had a PCN reaction occurring within the last 10 years: No If all of the above answers are "NO", then may proceed with Cephalosporin use.  . Other Rash    Adhesive tape    Current Outpatient Prescriptions  Medication Sig Dispense Refill  . buPROPion (WELLBUTRIN XL) 300 MG 24 hr tablet Take 300 mg by mouth daily.      . calcitonin, salmon, (MIACALCIN/FORTICAL) 200 UNIT/ACT nasal spray Place 1 spray into alternate nostrils daily.  3.7 mL 11  . Calcium Carbonate-Vitamin D (CALCIUM + D PO) Take 1 tablet by mouth daily.     . cycloSPORINE (RESTASIS) 0.05 % ophthalmic emulsion Place 1 drop into both eyes 2 (two) times daily. 5.5 mL 0  . dexamethasone (DECADRON) 4 MG tablet Take 2 tablets (8 mg total) by mouth 2 (two) times daily. Start the day before Taxotere. Then again the day after chemo for 3 days. 30 tablet 1  . diazepam (VALIUM) 5 MG tablet Take 5 mg by mouth every 6 (six) hours as needed for anxiety.     . fluconazole (DIFLUCAN) 100 MG tablet Take 1 tablet (100 mg total) by mouth daily. 20 tablet 0  . hydrOXYzine (ATARAX/VISTARIL) 25 MG tablet Take 1 tablet (25 mg total) by mouth 3 (three) times daily as needed for anxiety or itching. 30 tablet 0  . ketoconazole (NIZORAL) 2 % cream Apply 1 application topically daily. 15 g 0  . Levothyroxine Sodium (SYNTHROID PO) Take 137 mcg by mouth daily.     Marland Kitchen lidocaine-prilocaine (EMLA) cream Apply to affected area once (Patient taking differently: Apply 1 application topically as needed. Apply to affected area once) 30 g 3  . LORazepam (ATIVAN) 0.5 MG tablet Take 1 tablet (0.5 mg total) by mouth at bedtime as needed (Nausea or vomiting). 30 tablet 0  . methylPREDNISolone (MEDROL DOSEPAK) 4 MG TBPK tablet Take as directed 1 tablet 0  . Multiple Vitamin (MULTIVITAMIN) capsule Take 1 capsule by mouth daily.      . nabumetone (RELAFEN) 750 MG tablet Take 750 mg by mouth 2 (two) times daily as needed for mild pain.    Marland Kitchen nystatin cream (MYCOSTATIN) Apply 1 application topically 2 (two) times daily. 30 g 0  . ondansetron (ZOFRAN) 8 MG tablet Take 1 tablet (8 mg total) by mouth 2 (two) times daily as needed for refractory nausea / vomiting. Start on day 3 after chemo. 30 tablet 1  . oxyCODONE-acetaminophen (PERCOCET/ROXICET) 5-325 MG tablet Take 1 tablet by mouth every 8 (eight) hours as needed for severe pain. 30 tablet 0  . pantoprazole (PROTONIX) 40 MG tablet Take 40 mg by mouth daily.      . prochlorperazine (COMPAZINE) 10 MG tablet Take 1 tablet (10 mg total) by mouth every 6 (six) hours as needed (Nausea or vomiting). 30 tablet 1  . tobramycin (TOBREX) 0.3 % ophthalmic solution Place 1-2 drops into the right eye 4 (four) times daily. X 5 days 5 mL 0  . Triamterene-HCTZ (DYAZIDE PO) Take 1 tablet by mouth daily.     . valACYclovir (  VALTREX) 500 MG tablet Take 1 tablet (500 mg total) by mouth 2 (two) times daily. 60 tablet 0  . Vitamin D, Ergocalciferol, (DRISDOL) 50000 UNITS CAPS capsule One tablet every other week (Patient taking differently: Take 50,000 Units by mouth every 14 (fourteen) days. One tablet every other week) 10 capsule 3  . zolpidem (AMBIEN) 10 MG tablet Take 10 mg by mouth at bedtime as needed for sleep.      No current facility-administered medications for this visit.   Facility-Administered Medications Ordered in Other Visits  Medication Dose Route Frequency Provider Last Rate Last Dose  . cyclophosphamide (CYTOXAN) 1,120 mg in sodium chloride 0.9 % 250 mL chemo infusion  600 mg/m2 (Treatment Plan Actual) Intravenous Once Chauncey Cruel, MD 612 mL/hr at 10/24/15 1540 1,120 mg at 10/24/15 1540  . heparin lock flush 100 unit/mL  500 Units Intracatheter Once PRN Chauncey Cruel, MD      . pegfilgrastim (NEULASTA ONPRO KIT) injection 6 mg  6 mg Subcutaneous Once Chauncey Cruel, MD      . sodium chloride flush (NS) 0.9 % injection 10 mL  10 mL Intracatheter PRN Chauncey Cruel, MD        OBJECTIVE: Middle-aged white woman Who appears stated age  16 Vitals:   10/24/15 1202  BP: 139/80  Pulse: 81  Temp: 98.3 F (36.8 C)  Resp: 18     Body mass index is 30.51 kg/(m^2).    ECOG FS:1 - Symptomatic but completely ambulatory  Sclerae unicteric, EOMs intact Oropharynx clear and moist No cervical or supraclavicular adenopathy Lungs no rales or rhonchi Heart regular rate and rhythm Abd soft, nontender, positive bowel sounds MSK no focal spinal  tenderness, no upper extremity lymphedema Neuro: nonfocal, well oriented, appropriate affect Breasts: Deferred Skin: No apparent rash  LAB RESULTS:  CMP     Component Value Date/Time   NA 137 10/24/2015 1130   K 4.1 10/24/2015 1130   CO2 24 10/24/2015 1130   GLUCOSE 133 10/24/2015 1130   BUN 14.4 10/24/2015 1130   CREATININE 0.8 10/24/2015 1130   CALCIUM 9.4 10/24/2015 1130   PROT 7.0 10/24/2015 1130   ALBUMIN 3.6 10/24/2015 1130   AST 12 10/24/2015 1130   ALT 17 10/24/2015 1130   ALKPHOS 59 10/24/2015 1130   BILITOT 0.45 10/24/2015 1130    INo results found for: SPEP, UPEP  Lab Results  Component Value Date   WBC 10.2 10/24/2015   NEUTROABS 9.4* 10/24/2015   HGB 11.6 10/24/2015   HCT 34.6* 10/24/2015   MCV 92.2 10/24/2015   PLT 365 10/24/2015      Chemistry      Component Value Date/Time   NA 137 10/24/2015 1130   K 4.1 10/24/2015 1130   CO2 24 10/24/2015 1130   BUN 14.4 10/24/2015 1130   CREATININE 0.8 10/24/2015 1130      Component Value Date/Time   CALCIUM 9.4 10/24/2015 1130   ALKPHOS 59 10/24/2015 1130   AST 12 10/24/2015 1130   ALT 17 10/24/2015 1130   BILITOT 0.45 10/24/2015 1130       No results found for: LABCA2  No components found for: LABCA125  No results for input(s): INR in the last 168 hours.  Urinalysis No results found for: COLORURINE, APPEARANCEUR, LABSPEC, PHURINE, GLUCOSEU, HGBUR, BILIRUBINUR, KETONESUR, PROTEINUR, UROBILINOGEN, NITRITE, LEUKOCYTESUR    ELIGIBLE FOR AVAILABLE RESEARCH PROTOCOL: PALLAS  STUDIES: No results found.  ASSESSMENT: 70 y.o. [place] woman status post right breast upper  outer quadrant and right axillary lymph node biopsy 08/23/2015, both positive for a clinical T1 N2, stage IIIA invasive ductal carcinoma, grade 2, strongly estrogen receptor positive, 5% progesterone receptor positive, with an MIB-120-30% and no HER-2 amplification  (a) biopsy of a 0.9 cm Right breast satellite nodule 09/13/2015  showed invasive ductal carcinoma, grade 2, E-cadherin strongly positive, with a prognostic panel identical to the larger right upper quadrant mass  (1) left breast biopsy 08/24/2015 shows a complex sclerosing lesion  (2) neoadjuvant chemotherapy consisting of cyclophosphamide and docetaxel every 21 days 4, with OnPro support, started 09/12/2015  (3) definitive surgery to follow, likely including full axillary lymph node dissection  (4) adjuvant radiation to follow surgery  (5) anti-estrogens for 10 years to follow at the completion of local treatment-- consider PALLAS trial  (6) 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 By now she is very experienced, much less anxious, and knows that she needs to start stool softeners now, start antifungals now, and start the Medrol Dosepak as soon as she gets the first including of the rash.  She is going to see our nurse practitioner Ms. Curcio on July 3, when I will be out of town, to make sure she had no unusual side effects or complications from her third cycle of chemotherapy she will then see me on the day of her fourth and final cycle, July 17  I have set her up for a repeat breast MRI to be performed July 19, before her return visit with her surgeon Dr. Dalbert Batman.  She knows to call for any problems that may develop before her next visit here.     Chauncey Cruel, MD

## 2015-10-24 NOTE — Patient Instructions (Signed)
[facility] Discharge Instructions for Patients Receiving Chemotherapy  Today you received the following chemotherapy agents:  Taxotere & Cytoxan  To help prevent nausea and vomiting after your treatment, we encourage you to take your nausea medication.   If you develop nausea and vomiting that is not controlled by your nausea medication, call the clinic.   BELOW ARE SYMPTOMS THAT SHOULD BE REPORTED IMMEDIATELY:  *FEVER GREATER THAN 100.5 F  *CHILLS WITH OR WITHOUT FEVER  NAUSEA AND VOMITING THAT IS NOT CONTROLLED WITH YOUR NAUSEA MEDICATION  *UNUSUAL SHORTNESS OF BREATH  *UNUSUAL BRUISING OR BLEEDING  TENDERNESS IN MOUTH AND THROAT WITH OR WITHOUT PRESENCE OF ULCERS  *URINARY PROBLEMS  *BOWEL PROBLEMS  UNUSUAL RASH Items with * indicate a potential emergency and should be followed up as soon as possible.  Feel free to call the clinic you have any questions or concerns. The clinic phone number is [phone].  Please show the CHEMO ALERT CARD at check-in to the Emergency Department and triage nurse.   

## 2015-10-25 ENCOUNTER — Other Ambulatory Visit: Payer: Self-pay | Admitting: *Deleted

## 2015-10-25 ENCOUNTER — Telehealth: Payer: Self-pay | Admitting: *Deleted

## 2015-10-25 DIAGNOSIS — C50411 Malignant neoplasm of upper-outer quadrant of right female breast: Secondary | ICD-10-CM

## 2015-10-25 NOTE — Telephone Encounter (Signed)
TC from [facility] stating that pt's Restasis has been approved. Pt has been notified and approval leters have been sent out.

## 2015-10-26 ENCOUNTER — Ambulatory Visit: Payer: Self-pay

## 2015-10-31 ENCOUNTER — Ambulatory Visit (HOSPITAL_BASED_OUTPATIENT_CLINIC_OR_DEPARTMENT_OTHER): Payer: Medicare Other | Admitting: Oncology

## 2015-10-31 ENCOUNTER — Other Ambulatory Visit (HOSPITAL_BASED_OUTPATIENT_CLINIC_OR_DEPARTMENT_OTHER): Payer: Medicare Other

## 2015-10-31 ENCOUNTER — Encounter: Payer: Self-pay | Admitting: Oncology

## 2015-10-31 VITALS — BP 143/76 | HR 87 | Temp 98.9°F | Resp 19 | Wt 169.2 lb

## 2015-10-31 DIAGNOSIS — C50411 Malignant neoplasm of upper-outer quadrant of right female breast: Secondary | ICD-10-CM | POA: Diagnosis present

## 2015-10-31 DIAGNOSIS — C773 Secondary and unspecified malignant neoplasm of axilla and upper limb lymph nodes: Secondary | ICD-10-CM

## 2015-10-31 LAB — CBC WITH DIFFERENTIAL/PLATELET
BASO%: 0.5 % (ref 0.0–2.0)
Basophils Absolute: 0 10*3/uL (ref 0.0–0.1)
EOS%: 0.9 % (ref 0.0–7.0)
Eosinophils Absolute: 0 10*3/uL (ref 0.0–0.5)
HEMATOCRIT: 34.3 % — AB (ref 34.8–46.6)
HEMOGLOBIN: 11.5 g/dL — AB (ref 11.6–15.9)
LYMPH#: 0.8 10*3/uL — AB (ref 0.9–3.3)
LYMPH%: 43.5 % (ref 14.0–49.7)
MCH: 30.9 pg (ref 25.1–34.0)
MCHC: 33.5 g/dL (ref 31.5–36.0)
MCV: 92.3 fL (ref 79.5–101.0)
MONO#: 0.5 10*3/uL (ref 0.1–0.9)
MONO%: 27.5 % — ABNORMAL HIGH (ref 0.0–14.0)
NEUT%: 27.6 % — ABNORMAL LOW (ref 38.4–76.8)
NEUTROS ABS: 0.5 10*3/uL — AB (ref 1.5–6.5)
Platelets: 113 10*3/uL — ABNORMAL LOW (ref 145–400)
RBC: 3.72 10*6/uL (ref 3.70–5.45)
RDW: 15.8 % — AB (ref 11.2–14.5)
WBC: 1.8 10*3/uL — ABNORMAL LOW (ref 3.9–10.3)

## 2015-10-31 LAB — COMPREHENSIVE METABOLIC PANEL
ALT: 14 U/L (ref 0–55)
ANION GAP: 10 meq/L (ref 3–11)
AST: 15 U/L (ref 5–34)
Albumin: 3 g/dL — ABNORMAL LOW (ref 3.5–5.0)
Alkaline Phosphatase: 84 U/L (ref 40–150)
BUN: 10 mg/dL (ref 7.0–26.0)
CALCIUM: 9.2 mg/dL (ref 8.4–10.4)
CHLORIDE: 97 meq/L — AB (ref 98–109)
CO2: 26 mEq/L (ref 22–29)
Creatinine: 0.8 mg/dL (ref 0.6–1.1)
EGFR: 72 mL/min/{1.73_m2} — AB (ref >90)
Glucose: 92 mg/dl (ref 70–140)
POTASSIUM: 4.3 meq/L (ref 3.5–5.1)
Sodium: 133 mEq/L — ABNORMAL LOW (ref 136–145)
Total Bilirubin: 0.63 mg/dL (ref 0.20–1.20)
Total Protein: 6.4 g/dL (ref 6.4–8.3)

## 2015-10-31 MED ORDER — FLUCONAZOLE 100 MG PO TABS: 100.0000 mg | ORAL_TABLET | Freq: Every day | ORAL | Status: DC

## 2015-10-31 MED ORDER — NYSTATIN 100000 UNIT/GM EX CREA: 1.0000 "application " | TOPICAL_CREAM | Freq: Two times a day (BID) | CUTANEOUS | Status: DC

## 2015-10-31 MED ORDER — CIPROFLOXACIN HCL 500 MG PO TABS: 500.0000 mg | ORAL_TABLET | Freq: Two times a day (BID) | ORAL | Status: DC

## 2015-10-31 MED ORDER — VALACYCLOVIR HCL 500 MG PO TABS
500.0000 mg | ORAL_TABLET | Freq: Two times a day (BID) | ORAL | Status: DC
Start: 2015-10-31 — End: 2015-11-14

## 2015-10-31 MED ORDER — PROCHLORPERAZINE MALEATE 10 MG PO TABS: 10.0000 mg | ORAL_TABLET | Freq: Four times a day (QID) | ORAL | Status: DC | PRN

## 2015-10-31 MED ORDER — CLOTRIMAZOLE 10 MG MT TROC: 10.0000 mg | Freq: Every day | OROMUCOSAL | Status: DC

## 2015-10-31 NOTE — Progress Notes (Signed)
[place]  [phone] [phone]) [phone]     ID: [name] DOB: 17-Aug-1945  MR#: [id number]  JJH#:[id number]  Patient Care Team: Seward Carol, MD as PCP - General (Internal Medicine) Chauncey Cruel, MD as Consulting Physician (Oncology) Fanny Skates, MD as Consulting Physician (General Surgery) Luberta Robertson, MD (Radiology) Hennie Duos, MD as Consulting Physician (Rheumatology) Domingo Pulse, MD (Urology) Druscilla Brownie, MD as Consulting Physician (Dermatology) PCP: Kandice Hams, MD GYN: OTHER MD:  CHIEF COMPLAINT: Locally advanced breast cancer  CURRENT TREATMENT: Neoadjuvant chemotherapy  BREAST CANCER HISTORY: From the original intake note:  [name] had routine mammographic screening in 08/18/2015 at the [facility], showing a right breast mass and possible axillary adenopathy. On 08/23/2015 she underwent bilateral diagnostic mammography with tomography and bilateral breast ultrasonography. The breast density was category B in the right breast there was a spiculated mass in the upper-outer quadrant measuring 1.2 cm and associated with pleomorphic calcifications. There were calcifications extending anteriorly and posteriorly from the mass worrisome for ductal carcinoma in situ. The total area in question is about 3 cm. There were also multiple enlarged right axillary lymph nodes. The mass was palpated as an area of fullness at the 12:00 position of the breast 3 cm from the nipple. There were palpable enlarged right axillary lymph nodes in the right axilla. Ultrasonography of the right breast confirmed an irregular hypoechoic mass in the area in question measuring 1.6 cm. There were multiple abnormal appearing enlarged right axillary lymph nodes, the largest measuring 2.3 cm.  In the left breast, mammography showed an area of architectural distortion associated with the left nipple. This was not palpable. Ultrasonography of the left  breast found no correlate. This area of the left breast was biopsied under tomography 08/24/2015. This showed (SAA [id number]) a complex sclerosing lesion and fibroadenoma.  On 08/23/2015 the patient underwent biopsy of the right breast mass in 1 of the abnormal right axillary lymph nodes. 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 Both biopsies were HER-2 nonamplified, the breast being ratio 1.27 and number per cell 1.90, and the lymph node 1 ratio 1.24 and number per cell 3.0.  On 08/26/2015 the patient underwent bilateral breast MRI. In the left breast there was no suspicious enhancement. The left axilla was benign. In the right breast there was a mass described as in the upper inner quadrant measuring 2.3 cm. Less than a centimeter away from this mass there was a satellite nodule measuring 0.9 cm. In the right axilla there were multiple enlarged right axillary lymph nodes, including a confluent region measuring up to 4 cm. There were also enlarged level II lymph nodes present measuring up to 2.1 cm.  The patient's subsequent history is as detailed below  INTERVAL HISTORY: [name] returns today for follow up of her right-sided breast cancer. Today is day 1 cycle 3 of 4 planned cycles of cyclophosphamide and docetaxel, repeated every 21 days. She has obtained a very becoming weak and going with that she is generally feeling "great". The symptoms from her second cycle have completely resolved  REVIEW OF SYSTEMS: [name] is feeling more tired today. She has been resting more over the past couple of days. She reports more mouth ulcers and sores as well as candidiasis. She has been using fluconazole and Valtrex. She states that the rash that  she has been getting following each cycle of chemotherapy has started back up his past weekend. She has not yet  started the Medrol Dosepak for this. [name] continues to get very constipated. Using MiraLAX once a day. Has stool softeners, but did not chart taking these. She noted some low-grade fevers over the past weekend. She denies chills. She does feel somewhat fatigued and feels this is becoming cumulative. Her joint pain is also stable and well-controlled on her current pain medicines. Has also been using some Tylenol or ibuprofen when she takes the oxycodone for her pain. She has nausea but no vomiting. Trying to drink a lot of fluids and is eating some yogurt. A detailed review of systems today was otherwise noncontributory  PAST MEDICAL HISTORY: Past Medical History  Diagnosis Date  . Osteoporosis   . Menopausal symptoms   . Chronic vaginitis   . Fibromyalgia   . Interstitial cystitis   . Thyroid disease     hypothyroid  . Hypoglycemia   . Asthma   . PONV (postoperative nausea and vomiting)   . Cancer [facility])     breast cancer  . Pneumonia   . Depression     PAST SURGICAL HISTORY: Past Surgical History  Procedure Laterality Date  . Nasal sinus surgery      X4  . Bladder hyperextension      X4  . Bunionectomy    . Leg surgery  06/2004    Broken tibia  . Cholecystectomy  01/2006  . Cesarean section      DR. INGRAM  . Mouth surgery    . Dental surgery      13 dental implants  . Colonoscopy    . Portacath placement Left 09/08/2015    Procedure: INSERTION OF PORT-A-CATH ;  Surgeon: Fanny Skates, MD;  Location: [facility] OR;  Service: General;  Laterality: Left;    FAMILY HISTORY Family History  Problem Relation Age of Onset  . Hypertension Mother   . Heart disease Mother   . Parkinsonism Father   . Heart disease Sister   . Cancer Sister     MELANOMA  . Breast cancer Sister     Age 21  The patient's father died at age 43 from complications of Parkinson's disease. The patient's mother died age 79 with heart disease. [name] had no brothers. She had 2 sisters. One sister was diagnosed  with breast cancer at age 51. She was tested for the BRCA gene and was negative. The same sister also had melanoma diagnosed in her 43s. The patient has one maternal great aunt diagnosed with breast cancer around age 95. There is no history of ovarian cancer in the family  GYNECOLOGIC HISTORY:  No LMP recorded. Patient is postmenopausal. Menarche age 14, first live birth age 60. The patient is GX P2. She stopped having periods in her early 84s and took hormone replacement until her breast cancer diagnosis, April 2017.  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 He has been in remission for more than 10 years. Their daughter [name] lives in [place] were she is Production designer, theatre/television/film of the [employer]. Daughter [name] is an infectious disease M.D. working and teaching at [employer] in [place]. The patient has one granddaughter, 6 months old as of May 2017. The family attends [employer]  ADVANCED DIRECTIVES: Not in place   HEALTH MAINTENANCE: Social History  Substance Use Topics  . Smoking status: Former Smoker    Types: Cigarettes  . Smokeless tobacco: Never Used  . Alcohol Use: 0.0 oz/week    0 Standard drinks or equivalent per week     Comment: social     Colonoscopy: 2016/ [facility]  PAP: 2015  Bone density:   Lipid panel:  Allergies  Allergen Reactions  . Fluconazole     REACTION: elavated LFTs  . Nitrofurantoin Other (See Comments)    Unknown reaction  . Penicillins Other (See Comments)    Has patient had a PCN reaction causing immediate rash, facial/tongue/throat swelling, SOB or lightheadedness with hypotension: No Has patient had a PCN reaction causing severe rash involving mucus membranes or skin necrosis: No Has patient had a PCN reaction that required  hospitalization No Has patient had a PCN reaction occurring within the last 10 years: No If all of the above answers are "NO", then may proceed with Cephalosporin use.  . Other Rash    Adhesive tape    Current Outpatient Prescriptions  Medication Sig Dispense Refill  . buPROPion (WELLBUTRIN XL) 300 MG 24 hr tablet Take 300 mg by mouth daily.      . calcitonin, salmon, (MIACALCIN/FORTICAL) 200 UNIT/ACT nasal spray Place 1 spray into alternate nostrils daily. 3.7 mL 11  . Calcium Carbonate-Vitamin D (CALCIUM + D PO) Take 1 tablet by mouth daily.     . cycloSPORINE (RESTASIS) 0.05 % ophthalmic emulsion Place 1 drop into both eyes 2 (two) times daily. 5.5 mL 0  . dexamethasone (DECADRON) 4 MG tablet Take 2 tablets (8 mg total) by mouth 2 (two) times daily. Start the day before Taxotere. Then again the day after chemo for 3 days. 30 tablet 1  . diazepam (VALIUM) 5 MG tablet Take 5 mg by mouth every 6 (six) hours as needed for anxiety.     . fluconazole (DIFLUCAN) 100 MG tablet Take 1 tablet (100 mg total) by mouth daily. 30 tablet 0  . hydrOXYzine (ATARAX/VISTARIL) 25 MG tablet Take 1 tablet (25 mg total) by mouth 3 (three) times daily as needed for anxiety or itching. 30 tablet 0  . ketoconazole (NIZORAL) 2 % cream Apply 1 application topically daily. 15 g 0  . Levothyroxine Sodium (SYNTHROID PO) Take 137 mcg by mouth daily.     Marland Kitchen lidocaine-prilocaine (EMLA) cream Apply to affected area once (Patient taking differently: Apply 1 application topically as needed. Apply to affected area once) 30 g 3  . LORazepam (ATIVAN) 0.5 MG tablet Take 1 tablet (0.5 mg total) by mouth at bedtime as needed (Nausea or vomiting). 30 tablet 0  . methylPREDNISolone (MEDROL DOSEPAK) 4 MG TBPK tablet Take as directed 1 tablet 0  . Multiple Vitamin (MULTIVITAMIN) capsule Take 1 capsule by mouth daily.      . nabumetone (RELAFEN) 750 MG tablet Take 750 mg by mouth 2 (two) times daily as needed for mild pain.    Marland Kitchen nystatin  cream (MYCOSTATIN) Apply 1 application topically 2 (two) times daily. 30 g 1  . ondansetron (ZOFRAN) 8 MG tablet Take 1 tablet (8 mg total) by mouth 2 (two) times daily as needed for refractory nausea / vomiting. Start on day 3 after chemo. 30 tablet 1  . oxyCODONE-acetaminophen (PERCOCET/ROXICET) 5-325 MG tablet Take 1 tablet by mouth every 8 (eight) hours as needed for severe pain. 30 tablet 0  . pantoprazole (PROTONIX) 40 MG tablet  Take 40 mg by mouth daily.    . prochlorperazine (COMPAZINE) 10 MG tablet Take 1 tablet (10 mg total) by mouth every 6 (six) hours as needed (Nausea or vomiting). 30 tablet 1  . tobramycin (TOBREX) 0.3 % ophthalmic solution Place 1-2 drops into the right eye 4 (four) times daily. X 5 days 5 mL 0  . Triamterene-HCTZ (DYAZIDE PO) Take 1 tablet by mouth daily.     . valACYclovir (VALTREX) 500 MG tablet Take 1 tablet (500 mg total) by mouth 2 (two) times daily. 60 tablet 1  . Vitamin D, Ergocalciferol, (DRISDOL) 50000 UNITS CAPS capsule One tablet every other week (Patient taking differently: Take 50,000 Units by mouth every 14 (fourteen) days. One tablet every other week) 10 capsule 3  . zolpidem (AMBIEN) 10 MG tablet Take 10 mg by mouth at bedtime as needed for sleep.     . ciprofloxacin (CIPRO) 500 MG tablet Take 1 tablet (500 mg total) by mouth 2 (two) times daily. 14 tablet 0  . clotrimazole (MYCELEX) 10 MG troche Take 1 tablet (10 mg total) by mouth 5 (five) times daily. 150 tablet 0   No current facility-administered medications for this visit.    OBJECTIVE: Middle-aged white woman Who appears stated age  10 Vitals:   10/31/15 1342  BP: 143/76  Pulse: 87  Temp: 98.9 F (37.2 C)  Resp: 19     Body mass index is 29.98 kg/(m^2).    ECOG FS:1 - Symptomatic but completely ambulatory  Sclerae unicteric, EOMs intact Oropharynx Shows candidiasis on her tongue. Dry cracked areas at the corners of her mouth. No cervical or supraclavicular adenopathy Lungs no  rales or rhonchi Heart regular rate and rhythm Abd soft, nontender, positive bowel sounds MSK no focal spinal tenderness, no upper extremity lymphedema Neuro: nonfocal, well oriented, appropriate affect Breasts: Deferred Skin: Rash to her left axilla.  LAB RESULTS:  CMP     Component Value Date/Time   NA 133* 10/31/2015 1326   K 4.3 10/31/2015 1326   CO2 26 10/31/2015 1326   GLUCOSE 92 10/31/2015 1326   BUN 10.0 10/31/2015 1326   CREATININE 0.8 10/31/2015 1326   CALCIUM 9.2 10/31/2015 1326   PROT 6.4 10/31/2015 1326   ALBUMIN 3.0* 10/31/2015 1326   AST 15 10/31/2015 1326   ALT 14 10/31/2015 1326   ALKPHOS 84 10/31/2015 1326   BILITOT 0.63 10/31/2015 1326    INo results found for: SPEP, UPEP  Lab Results  Component Value Date   WBC 1.8* 10/31/2015   NEUTROABS 0.5* 10/31/2015   HGB 11.5* 10/31/2015   HCT 34.3* 10/31/2015   MCV 92.3 10/31/2015   PLT 113* 10/31/2015      Chemistry      Component Value Date/Time   NA 133* 10/31/2015 1326   K 4.3 10/31/2015 1326   CO2 26 10/31/2015 1326   BUN 10.0 10/31/2015 1326   CREATININE 0.8 10/31/2015 1326      Component Value Date/Time   CALCIUM 9.2 10/31/2015 1326   ALKPHOS 84 10/31/2015 1326   AST 15 10/31/2015 1326   ALT 14 10/31/2015 1326   BILITOT 0.63 10/31/2015 1326       No results found for: LABCA2  No components found for: LABCA125  No results for input(s): INR in the last 168 hours.  Urinalysis No results found for: COLORURINE, APPEARANCEUR, LABSPEC, PHURINE, GLUCOSEU, HGBUR, BILIRUBINUR, KETONESUR, PROTEINUR, UROBILINOGEN, NITRITE, LEUKOCYTESUR    ELIGIBLE FOR AVAILABLE RESEARCH PROTOCOL: PALLAS  STUDIES: No results  found.  ASSESSMENT: 71 y.o. [place] woman status post right breast upper outer quadrant and right axillary lymph node biopsy 08/23/2015, both positive for a clinical T1 N2, stage IIIA invasive ductal carcinoma, grade 2, strongly estrogen receptor positive, 5% progesterone receptor  positive, with an MIB-120-30% and no HER-2 amplification  (a) biopsy of a 0.9 cm Right breast satellite nodule 09/13/2015 showed invasive ductal carcinoma, grade 2, E-cadherin strongly positive, with a prognostic panel identical to the larger right upper quadrant mass  (1) left breast biopsy 08/24/2015 shows a complex sclerosing lesion  (2) neoadjuvant chemotherapy consisting of cyclophosphamide and docetaxel every 21 days 4, with OnPro support, started 09/12/2015  (3) definitive surgery to follow, likely including full axillary lymph node dissection  (4) adjuvant radiation to follow surgery  (5) anti-estrogens for 10 years to follow at the completion of local treatment-- consider PALLAS trial  (6) genetics testing pending (Askenazi descent)  PLAN: [place] has completed 3 cycles of her chemotherapy. She is now having more [name] effects which are becoming cumulative. We discussed her CBC results in detail. She is neutropenic and we have discussed neutropenic precautions. She is to call for fever 100.5 or higher. I have encouraged her to decrease her use of Tylenol and ibuprofen as this could mask a fever. She does report some low-grade temperatures over this past weekend but is afebrile in our office today. I have given her prescription for Cipro 500 mg twice a day for one week. We discussed that the Cipro could worsen her candidiasis. She is aware of this and is willing to take the Cipro.  I have refilled her fluconazole 100 mg daily. The patient asked for Mycelex troches and I have also given her prescription for this today. I have refilled her Valtrex and her nystatin cream. I have encouraged her to use the nystatin cream on her angular chelosis and also to her left axilla twice a day.   We discussed how to use her anti-emetics to control her nausea. I have refilled her Compazine today. Her sure to stay hydrated and to eat soft bland foods as she tolerates.  The patient will return in 2 weeks  prior to her fourth cycle of chemotherapy.  She is already set her up for a repeat breast MRI to be performed July 19, before her return visit with her surgeon Dr. Dalbert Batman.  She knows to call for any problems that may develop before her next visit here.     Mikey Bussing, NP

## 2015-11-07 ENCOUNTER — Telehealth: Payer: Self-pay | Admitting: *Deleted

## 2015-11-07 NOTE — Telephone Encounter (Signed)
  Oncology Nurse Navigator Documentation    Navigator Encounter Type: Telephone (11/07/15 1600) Telephone: Incoming Call;Appt Confirmation/Clarification;Symptom Mgt (11/07/15 1600)  Discussed fatigue management. Denies further needs at this time                                      Time Spent with Patient: 30 (11/07/15 1600)

## 2015-11-13 NOTE — Progress Notes (Signed)
[name]  [phone] [phone]     ID: [name] DOB: 11-07-1945  MR#: [id number]  BRA#:[id number]  Patient Care Team: Seward Carol, MD as PCP - General (Internal Medicine) Chauncey Cruel, MD as Consulting Physician (Oncology) Fanny Skates, MD as Consulting Physician (General Surgery) Luberta Robertson, MD (Radiology) Hennie Duos, MD as Consulting Physician (Rheumatology) Domingo Pulse, MD (Urology) Druscilla Brownie, MD as Consulting Physician (Dermatology) PCP: Kandice Hams, MD GYN: OTHER MD:  CHIEF COMPLAINT: Locally advanced breast cancer  CURRENT TREATMENT: Neoadjuvant chemotherapy  BREAST CANCER HISTORY: From the original intake note:  [name] had routine mammographic screening in 08/18/2015 at the [facility], showing a right breast mass and possible axillary adenopathy. On 08/23/2015 she underwent bilateral diagnostic mammography with tomography and bilateral breast ultrasonography. The breast density was category B in the right breast there was a spiculated mass in the upper-outer quadrant measuring 1.2 cm and associated with pleomorphic calcifications. There were calcifications extending anteriorly and posteriorly from the mass worrisome for ductal carcinoma in situ. The total area in question is about 3 cm. There were also multiple enlarged right axillary lymph nodes. The mass was palpated as an area of fullness at the 12:00 position of the breast 3 cm from the nipple. There were palpable enlarged right axillary lymph nodes in the right axilla. Ultrasonography of the right breast confirmed an irregular hypoechoic mass in the area in question measuring 1.6 cm. There were multiple abnormal appearing enlarged right axillary lymph nodes, the largest measuring 2.3 cm.  In the left breast, mammography showed an area of architectural distortion associated with the left nipple. This was not palpable. Ultrasonography of the left  breast found no correlate. This area of the left breast was biopsied under tomography 08/24/2015. This showed (SAA [id number]) a complex sclerosing lesion and fibroadenoma.  On 08/23/2015 the patient underwent biopsy of the right breast mass in 1 of the abnormal right axillary lymph nodes. 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 Both biopsies were HER-2 nonamplified, the breast being ratio 1.27 and number per cell 1.90, and the lymph node 1 ratio 1.24 and number per cell 3.0.  On 08/26/2015 the patient underwent bilateral breast MRI. In the left breast there was no suspicious enhancement. The left axilla was benign. In the right breast there was a mass described as in the upper inner quadrant measuring 2.3 cm. Less than a centimeter away from this mass there was a satellite nodule measuring 0.9 cm. In the right axilla there were multiple enlarged right axillary lymph nodes, including a confluent region measuring up to 4 cm. There were also enlarged level II lymph nodes present measuring up to 2.1 cm.  The patient's subsequent history is as detailed below  INTERVAL HISTORY: [name] returns today for follow up of her estrogen receptor positive breast cancer, accompanied by her husband [name] and her daughter [name]. Today is day 1 cycle 4 of 4 planned cycles of cyclophosphamide and docetaxel, repeated every 21 days.   REVIEW OF SYSTEMS: [name] has felt more tired with her third cycle, but she is also much more confident in taking care of [name] effects. She is having more tearing, but was able to obtain the Restasis eyedrops. She has pain all over of course since she has a history of fibromyalgia. She also has had epistaxis  and some ringing in her ear. She has had minimal mouth sores. Nausea is moderate currently well-controlled. A detailed review of  systems today was otherwise noncontributory   PAST MEDICAL HISTORY: Past Medical History  Diagnosis Date  . Osteoporosis   . Menopausal symptoms   . Chronic vaginitis   . Fibromyalgia   . Interstitial cystitis   . Thyroid disease     hypothyroid  . Hypoglycemia   . Asthma   . PONV (postoperative nausea and vomiting)   . Cancer [facility])     breast cancer  . Pneumonia   . Depression     PAST SURGICAL HISTORY: Past Surgical History  Procedure Laterality Date  . Nasal sinus surgery      X4  . Bladder hyperextension      X4  . Bunionectomy    . Leg surgery  06/2004    Broken tibia  . Cholecystectomy  01/2006  . Cesarean section      DR. INGRAM  . Mouth surgery    . Dental surgery      13 dental implants  . Colonoscopy    . Portacath placement Left 09/08/2015    Procedure: INSERTION OF PORT-A-CATH ;  Surgeon: Fanny Skates, MD;  Location: [facility] OR;  Service: General;  Laterality: Left;    FAMILY HISTORY Family History  Problem Relation Age of Onset  . Hypertension Mother   . Heart disease Mother   . Parkinsonism Father   . Heart disease Sister   . Cancer Sister     MELANOMA  . Breast cancer Sister     Age 22  The patient's father died at age 35 from complications of Parkinson's disease. The patient's mother died age 59 with heart disease. [name] had no brothers. She had 2 sisters. One sister was diagnosed with breast cancer at age 20. She was tested for the BRCA gene and was negative. The same sister also had melanoma diagnosed in her 37s. The patient has one maternal great aunt diagnosed with breast cancer around age 76. There is no history of ovarian cancer in the family  GYNECOLOGIC HISTORY:  No LMP recorded. Patient is postmenopausal. Menarche age 47, first live birth age 34. The patient is GX P2. She stopped having periods in her early 33s and took hormone replacement until her breast cancer diagnosis, April 2017.  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 He has been in remission for more than 10 years. Their daughter [name] lives in [place] were she is Production designer, theatre/television/film of the [employer]. Daughter [name] is an infectious disease M.D. working and teaching at [employer] in [place]. The patient has one granddaughter, 95 months old as of May 2017. The family attends [employer]: Not in place   HEALTH MAINTENANCE: Social History  Substance Use Topics  . Smoking status: Former Smoker    Types: Cigarettes  . Smokeless tobacco: Never Used  . Alcohol Use: 0.0 oz/week    0 Standard drinks or equivalent per week     Comment: social     Colonoscopy: 2016/ [facility]  PAP: 2015  Bone density:   Lipid panel:  Allergies  Allergen Reactions  . Fluconazole     REACTION: elavated LFTs  . Nitrofurantoin Other (See Comments)  Unknown reaction  . Penicillins Other (See Comments)    Has patient had a PCN reaction causing immediate rash, facial/tongue/throat swelling, SOB or lightheadedness with hypotension: No Has patient had a PCN reaction causing severe rash involving mucus membranes or skin necrosis: No Has patient had a PCN reaction that required hospitalization No Has patient had a PCN reaction occurring within the last 10 years: No If all of the above answers are "NO", then may proceed with Cephalosporin use.  . Other Rash    Adhesive tape    Current Outpatient Prescriptions  Medication Sig Dispense Refill  . buPROPion (WELLBUTRIN XL) 300 MG 24 hr tablet Take 300 mg by mouth daily.      . calcitonin, salmon, (MIACALCIN/FORTICAL) 200 UNIT/ACT nasal spray Place 1 spray into alternate nostrils daily. 3.7 mL 11  . Calcium Carbonate-Vitamin D (CALCIUM + D PO) Take 1 tablet by mouth daily.     . ciprofloxacin (CIPRO) 500  MG tablet Take 1 tablet (500 mg total) by mouth 2 (two) times daily. 14 tablet 0  . clotrimazole (MYCELEX) 10 MG troche Take 1 tablet (10 mg total) by mouth 5 (five) times daily. 150 tablet 0  . cycloSPORINE (RESTASIS) 0.05 % ophthalmic emulsion Place 1 drop into both eyes 2 (two) times daily. 5.5 mL 0  . dexamethasone (DECADRON) 4 MG tablet Take 2 tablets (8 mg total) by mouth 2 (two) times daily. Start the day before Taxotere. Then again the day after chemo for 3 days. 30 tablet 1  . diazepam (VALIUM) 5 MG tablet Take 5 mg by mouth every 6 (six) hours as needed for anxiety.     . fluconazole (DIFLUCAN) 100 MG tablet Take 1 tablet (100 mg total) by mouth daily. 30 tablet 0  . hydrOXYzine (ATARAX/VISTARIL) 25 MG tablet Take 1 tablet (25 mg total) by mouth 3 (three) times daily as needed for anxiety or itching. 30 tablet 0  . ketoconazole (NIZORAL) 2 % cream Apply 1 application topically daily. 15 g 0  . Levothyroxine Sodium (SYNTHROID PO) Take 137 mcg by mouth daily.     Marland Kitchen lidocaine-prilocaine (EMLA) cream Apply to affected area once (Patient taking differently: Apply 1 application topically as needed. Apply to affected area once) 30 g 3  . LORazepam (ATIVAN) 0.5 MG tablet Take 1 tablet (0.5 mg total) by mouth at bedtime as needed (Nausea or vomiting). 30 tablet 0  . methylPREDNISolone (MEDROL DOSEPAK) 4 MG TBPK tablet Take as directed 1 tablet 0  . methylPREDNISolone (MEDROL DOSEPAK) 4 MG TBPK tablet Take as directed 21 tablet 0  . Multiple Vitamin (MULTIVITAMIN) capsule Take 1 capsule by mouth daily.      . nabumetone (RELAFEN) 750 MG tablet Take 750 mg by mouth 2 (two) times daily as needed for mild pain.    Marland Kitchen nystatin cream (MYCOSTATIN) Apply 1 application topically 2 (two) times daily. 30 g 1  . ondansetron (ZOFRAN) 8 MG tablet Take 1 tablet (8 mg total) by mouth 2 (two) times daily as needed for refractory nausea / vomiting. Start on day 3 after chemo. 30 tablet 1  . oxyCODONE-acetaminophen  (PERCOCET/ROXICET) 5-325 MG tablet Take 1 tablet by mouth every 8 (eight) hours as needed for severe pain. 30 tablet 0  . pantoprazole (PROTONIX) 40 MG tablet Take 40 mg by mouth daily.    . prochlorperazine (COMPAZINE) 10 MG tablet Take 1 tablet (10 mg total) by mouth every 6 (six) hours as needed (Nausea or vomiting). 30 tablet 1  .  Triamterene-HCTZ (DYAZIDE PO) Take 1 tablet by mouth daily.     . valACYclovir (VALTREX) 500 MG tablet Take 1 tablet (500 mg total) by mouth 2 (two) times daily. 60 tablet 1  . Vitamin D, Ergocalciferol, (DRISDOL) 50000 UNITS CAPS capsule One tablet every other week (Patient taking differently: Take 50,000 Units by mouth every 14 (fourteen) days. One tablet every other week) 10 capsule 3  . zolpidem (AMBIEN) 10 MG tablet Take 10 mg by mouth at bedtime as needed for sleep.      No current facility-administered medications for this visit.    OBJECTIVE: Middle-aged white woman In no acute distress Filed Vitals:   11/14/15 1109  BP: 127/65  Pulse: 77  Temp: 97.9 F (36.6 C)  Resp: 17     Body mass index is 30.18 kg/(m^2).    ECOG FS:1 - Symptomatic but completely ambulatory  Sclerae unicteric, pupils round and equal Oropharynx clear and moist-- no thrush or other lesions No cervical or supraclavicular adenopathy Lungs no rales or rhonchi Heart regular rate and rhythm Abd soft, nontender, positive bowel sounds MSK no focal spinal tenderness, no upper extremity lymphedema Neuro: nonfocal, well oriented, appropriate affect Breasts: Deferred   LAB RESULTS:  CMP     Component Value Date/Time   NA 138 11/14/2015 1052   K 3.4* 11/14/2015 1052   CO2 26 11/14/2015 1052   GLUCOSE 86 11/14/2015 1052   BUN 10.1 11/14/2015 1052   CREATININE 0.7 11/14/2015 1052   CALCIUM 9.2 11/14/2015 1052   PROT 6.5 11/14/2015 1052   ALBUMIN 3.3* 11/14/2015 1052   AST 12 11/14/2015 1052   ALT 12 11/14/2015 1052   ALKPHOS 65 11/14/2015 1052   BILITOT 0.44 11/14/2015 1052     INo results found for: SPEP, UPEP  Lab Results  Component Value Date   WBC 13.9* 11/14/2015   NEUTROABS 10.3* 11/14/2015   HGB 11.3* 11/14/2015   HCT 33.1* 11/14/2015   MCV 93.0 11/14/2015   PLT 289 11/14/2015      Chemistry      Component Value Date/Time   NA 138 11/14/2015 1052   K 3.4* 11/14/2015 1052   CO2 26 11/14/2015 1052   BUN 10.1 11/14/2015 1052   CREATININE 0.7 11/14/2015 1052      Component Value Date/Time   CALCIUM 9.2 11/14/2015 1052   ALKPHOS 65 11/14/2015 1052   AST 12 11/14/2015 1052   ALT 12 11/14/2015 1052   BILITOT 0.44 11/14/2015 1052       No results found for: LABCA2  No components found for: LABCA125  No results for input(s): INR in the last 168 hours.  Urinalysis No results found for: COLORURINE, APPEARANCEUR, LABSPEC, PHURINE, GLUCOSEU, HGBUR, BILIRUBINUR, KETONESUR, PROTEINUR, UROBILINOGEN, NITRITE, LEUKOCYTESUR    ELIGIBLE FOR AVAILABLE RESEARCH PROTOCOL: PALLAS  STUDIES: No results found.  ASSESSMENT: 70 y.o. [place] woman status post right breast upper outer quadrant and right axillary lymph node biopsy 08/23/2015, both positive for a clinical T1 N2, stage IIIA invasive ductal carcinoma, grade 2, strongly estrogen receptor positive, 5% progesterone receptor positive, with an MIB-120-30% and no HER-2 amplification  (a) biopsy of a 0.9 cm Right breast satellite nodule 09/13/2015 showed invasive ductal carcinoma, grade 2, E-cadherin strongly positive, with a prognostic panel identical to the larger right upper quadrant mass  (1) left breast biopsy 08/24/2015 shows a complex sclerosing lesion  (2) neoadjuvant chemotherapy consisting of cyclophosphamide and docetaxel every 21 days 4, with OnPro support, started 09/12/2015  (3) definitive surgery  to follow, likely including full axillary lymph node dissection  (4) adjuvant radiation to follow surgery  (5) anti-estrogens for 10 years to follow at the completion of local  treatment-- consider PALLAS trial  (6) 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 I went ahead and refill multiple of her meds, which have allowed her to complete treatment without major difficulties. I also offered to give her some intravenous fluidsLater this week and a 7 scheduled for July 19 and the next 2 days  She really has a follow-up MRI scheduled for 11/16/2015 and she will see me next Monday to discuss results. She will then follow-up with Dr. Dalbert Batman to nail down a surgical date.  I'm delighted that she has been able to complete her treatments. She will call with any problems that may develop before her next visit here, a week from now.       Chauncey Cruel, MD

## 2015-11-14 ENCOUNTER — Ambulatory Visit (HOSPITAL_BASED_OUTPATIENT_CLINIC_OR_DEPARTMENT_OTHER): Payer: Medicare Other | Admitting: Oncology

## 2015-11-14 ENCOUNTER — Encounter: Payer: Self-pay | Admitting: *Deleted

## 2015-11-14 ENCOUNTER — Telehealth: Payer: Self-pay | Admitting: Oncology

## 2015-11-14 ENCOUNTER — Ambulatory Visit (HOSPITAL_BASED_OUTPATIENT_CLINIC_OR_DEPARTMENT_OTHER): Payer: Medicare Other

## 2015-11-14 ENCOUNTER — Other Ambulatory Visit (HOSPITAL_BASED_OUTPATIENT_CLINIC_OR_DEPARTMENT_OTHER): Payer: Medicare Other

## 2015-11-14 VITALS — BP 127/65 | HR 77 | Temp 97.9°F | Resp 17 | Ht 63.0 in | Wt 170.3 lb

## 2015-11-14 DIAGNOSIS — C773 Secondary and unspecified malignant neoplasm of axilla and upper limb lymph nodes: Secondary | ICD-10-CM | POA: Diagnosis not present

## 2015-11-14 DIAGNOSIS — C50411 Malignant neoplasm of upper-outer quadrant of right female breast: Secondary | ICD-10-CM | POA: Diagnosis present

## 2015-11-14 DIAGNOSIS — Z5111 Encounter for antineoplastic chemotherapy: Secondary | ICD-10-CM

## 2015-11-14 DIAGNOSIS — Z170 Estrogen receptor positive status [ER+]: Secondary | ICD-10-CM | POA: Diagnosis not present

## 2015-11-14 DIAGNOSIS — M797 Fibromyalgia: Secondary | ICD-10-CM

## 2015-11-14 LAB — CBC WITH DIFFERENTIAL/PLATELET
BASO%: 0.1 % (ref 0.0–2.0)
Basophils Absolute: 0 10*3/uL (ref 0.0–0.1)
EOS%: 0 % (ref 0.0–7.0)
Eosinophils Absolute: 0 10*3/uL (ref 0.0–0.5)
HEMATOCRIT: 33.1 % — AB (ref 34.8–46.6)
HEMOGLOBIN: 11.3 g/dL — AB (ref 11.6–15.9)
LYMPH#: 1.7 10*3/uL (ref 0.9–3.3)
LYMPH%: 12.2 % — ABNORMAL LOW (ref 14.0–49.7)
MCH: 31.7 pg (ref 25.1–34.0)
MCHC: 34.1 g/dL (ref 31.5–36.0)
MCV: 93 fL (ref 79.5–101.0)
MONO#: 1.9 10*3/uL — AB (ref 0.1–0.9)
MONO%: 14 % (ref 0.0–14.0)
NEUT%: 73.7 % (ref 38.4–76.8)
NEUTROS ABS: 10.3 10*3/uL — AB (ref 1.5–6.5)
PLATELETS: 289 10*3/uL (ref 145–400)
RBC: 3.56 10*6/uL — ABNORMAL LOW (ref 3.70–5.45)
RDW: 17.6 % — AB (ref 11.2–14.5)
WBC: 13.9 10*3/uL — ABNORMAL HIGH (ref 3.9–10.3)

## 2015-11-14 LAB — COMPREHENSIVE METABOLIC PANEL
ALT: 12 U/L (ref 0–55)
ANION GAP: 10 meq/L (ref 3–11)
AST: 12 U/L (ref 5–34)
Albumin: 3.3 g/dL — ABNORMAL LOW (ref 3.5–5.0)
Alkaline Phosphatase: 65 U/L (ref 40–150)
BILIRUBIN TOTAL: 0.44 mg/dL (ref 0.20–1.20)
BUN: 10.1 mg/dL (ref 7.0–26.0)
CALCIUM: 9.2 mg/dL (ref 8.4–10.4)
CO2: 26 mEq/L (ref 22–29)
CREATININE: 0.7 mg/dL (ref 0.6–1.1)
Chloride: 101 mEq/L (ref 98–109)
EGFR: 83 mL/min/{1.73_m2} — ABNORMAL LOW (ref >90)
Glucose: 86 mg/dl (ref 70–140)
Potassium: 3.4 mEq/L — ABNORMAL LOW (ref 3.5–5.1)
Sodium: 138 mEq/L (ref 136–145)
TOTAL PROTEIN: 6.5 g/dL (ref 6.4–8.3)

## 2015-11-14 MED ORDER — FLUCONAZOLE 100 MG PO TABS: 100.0000 mg | ORAL_TABLET | Freq: Every day | ORAL | Status: DC

## 2015-11-14 MED ORDER — DIPHENHYDRAMINE HCL 50 MG/ML IJ SOLN
INTRAMUSCULAR | Status: AC
  Filled 2015-11-14: qty 1

## 2015-11-14 MED ORDER — SODIUM CHLORIDE 0.9 % IV SOLN
600.0000 mg/m2 | Freq: Once | INTRAVENOUS | Status: AC
  Administered 2015-11-14: 1120 mg via INTRAVENOUS
  Filled 2015-11-14: qty 56

## 2015-11-14 MED ORDER — SODIUM CHLORIDE 0.9% FLUSH
10.0000 mL | INTRAVENOUS | Status: DC | PRN
  Administered 2015-11-14: 10 mL
  Filled 2015-11-14: qty 10

## 2015-11-14 MED ORDER — VALACYCLOVIR HCL 500 MG PO TABS: 500.0000 mg | ORAL_TABLET | Freq: Two times a day (BID) | ORAL | Status: DC

## 2015-11-14 MED ORDER — SODIUM CHLORIDE 0.9 % IV SOLN
75.0000 mg/m2 | Freq: Once | INTRAVENOUS | Status: AC
  Administered 2015-11-14: 140 mg via INTRAVENOUS
  Filled 2015-11-14: qty 14

## 2015-11-14 MED ORDER — SODIUM CHLORIDE 0.9 % IV SOLN
Freq: Once | INTRAVENOUS | Status: AC
  Administered 2015-11-14: 12:00:00 via INTRAVENOUS

## 2015-11-14 MED ORDER — PEGFILGRASTIM 6 MG/0.6ML ~~LOC~~ PSKT
6.0000 mg | PREFILLED_SYRINGE | Freq: Once | SUBCUTANEOUS | Status: AC
  Administered 2015-11-14: 6 mg via SUBCUTANEOUS
  Filled 2015-11-14: qty 0.6

## 2015-11-14 MED ORDER — OXYCODONE-ACETAMINOPHEN 5-325 MG PO TABS: 1.0000 | ORAL_TABLET | Freq: Three times a day (TID) | ORAL | Status: DC | PRN

## 2015-11-14 MED ORDER — DEXAMETHASONE 4 MG PO TABS: 8.0000 mg | ORAL_TABLET | Freq: Two times a day (BID) | ORAL | Status: DC

## 2015-11-14 MED ORDER — METHYLPREDNISOLONE 4 MG PO TBPK: ORAL_TABLET | ORAL | Status: DC

## 2015-11-14 MED ORDER — DIPHENHYDRAMINE HCL 50 MG/ML IJ SOLN
25.0000 mg | Freq: Once | INTRAMUSCULAR | Status: AC
  Administered 2015-11-14: 25 mg via INTRAVENOUS

## 2015-11-14 MED ORDER — PALONOSETRON HCL INJECTION 0.25 MG/5ML
0.2500 mg | Freq: Once | INTRAVENOUS | Status: AC
Start: 2015-11-14 — End: 2015-11-14
  Administered 2015-11-14: 0.25 mg via INTRAVENOUS

## 2015-11-14 MED ORDER — HEPARIN SOD (PORK) LOCK FLUSH 100 UNIT/ML IV SOLN
500.0000 [IU] | Freq: Once | INTRAVENOUS | Status: AC | PRN
  Administered 2015-11-14: 500 [IU]
  Filled 2015-11-14: qty 5

## 2015-11-14 MED ORDER — DEXAMETHASONE SODIUM PHOSPHATE 100 MG/10ML IJ SOLN
10.0000 mg | Freq: Once | INTRAMUSCULAR | Status: AC
Start: 2015-11-14 — End: 2015-11-14
  Administered 2015-11-14: 10 mg via INTRAVENOUS
  Filled 2015-11-14: qty 1

## 2015-11-14 MED ORDER — PALONOSETRON HCL INJECTION 0.25 MG/5ML
INTRAVENOUS | Status: AC
  Filled 2015-11-14: qty 5

## 2015-11-14 NOTE — Telephone Encounter (Signed)
appt made and avs printed °

## 2015-11-14 NOTE — Patient Instructions (Signed)
[facility] Discharge Instructions for Patients Receiving Chemotherapy  Today you received the following chemotherapy agents;  Taxotere and Cytoxan.    To help prevent nausea and vomiting after your treatment, we encourage you to take your nausea medication as directed.     If you develop nausea and vomiting that is not controlled by your nausea medication, call the clinic.   BELOW ARE SYMPTOMS THAT SHOULD BE REPORTED IMMEDIATELY:  *FEVER GREATER THAN 100.5 F  *CHILLS WITH OR WITHOUT FEVER  NAUSEA AND VOMITING THAT IS NOT CONTROLLED WITH YOUR NAUSEA MEDICATION  *UNUSUAL SHORTNESS OF BREATH  *UNUSUAL BRUISING OR BLEEDING  TENDERNESS IN MOUTH AND THROAT WITH OR WITHOUT PRESENCE OF ULCERS  *URINARY PROBLEMS  *BOWEL PROBLEMS  UNUSUAL RASH Items with * indicate a potential emergency and should be followed up as soon as possible.  Feel free to call the clinic you have any questions or concerns. The clinic phone number is [phone].  Please show the CHEMO ALERT CARD at check-in to the Emergency Department and triage nurse.   

## 2015-11-16 ENCOUNTER — Ambulatory Visit
Admission: RE | Admit: 2015-11-16 | Discharge: 2015-11-16 | Disposition: A | Discharge status: Home / Self Care | Payer: Medicare Other | Source: Ambulatory Visit | Attending: Oncology | Admitting: Oncology

## 2015-11-16 ENCOUNTER — Ambulatory Visit (HOSPITAL_BASED_OUTPATIENT_CLINIC_OR_DEPARTMENT_OTHER): Payer: Medicare Other

## 2015-11-16 ENCOUNTER — Telehealth: Payer: Self-pay

## 2015-11-16 ENCOUNTER — Other Ambulatory Visit: Payer: Self-pay

## 2015-11-16 VITALS — BP 127/64 | HR 80 | Temp 98.2°F | Resp 16

## 2015-11-16 DIAGNOSIS — C773 Secondary and unspecified malignant neoplasm of axilla and upper limb lymph nodes: Secondary | ICD-10-CM

## 2015-11-16 DIAGNOSIS — R110 Nausea: Secondary | ICD-10-CM | POA: Diagnosis present

## 2015-11-16 DIAGNOSIS — C50411 Malignant neoplasm of upper-outer quadrant of right female breast: Secondary | ICD-10-CM

## 2015-11-16 MED ORDER — HEPARIN SOD (PORK) LOCK FLUSH 100 UNIT/ML IV SOLN
500.0000 [IU] | Freq: Once | INTRAVENOUS | Status: AC | PRN
  Administered 2015-11-16: 500 [IU]
  Filled 2015-11-16: qty 5

## 2015-11-16 MED ORDER — SODIUM CHLORIDE 0.9 % IJ SOLN
10.0000 mL | INTRAMUSCULAR | Status: DC | PRN
  Administered 2015-11-16: 10 mL
  Filled 2015-11-16: qty 10

## 2015-11-16 MED ORDER — GADOBENATE DIMEGLUMINE 529 MG/ML IV SOLN
15.0000 mL | Freq: Once | INTRAVENOUS | Status: AC | PRN
  Administered 2015-11-16: 15 mL via INTRAVENOUS

## 2015-11-16 MED ORDER — SODIUM CHLORIDE 0.9 % IV SOLN
Freq: Once | INTRAVENOUS | Status: AC
  Administered 2015-11-16: 15:00:00 via INTRAVENOUS

## 2015-11-16 MED ORDER — SODIUM CHLORIDE 0.9 % IV SOLN
Freq: Once | INTRAVENOUS | Status: AC
  Administered 2015-11-16: 16:00:00 via INTRAVENOUS
  Filled 2015-11-16: qty 4

## 2015-11-16 NOTE — Progress Notes (Signed)
Pt c/o nausea post breast MRI after laying on her stomach . Obtained order for IV zofran.  Nausea resolved after Zofran.  Pt also states she has been slightly short of breath with activity-esp. Going up stairs, requiring her to stop half way up and rest before going the rest of the way.  She states she feels short of breath walking all the way back to the treatment area.  VS stable prior to fluids and post fluids. Rested well during IVFs w/o complaints.  Pt discharged home in the care of her husband. After patient got to the car, husband returned to infusion area stating his wife was feeling short of breath again. Went to see patient-she was sitting in the car with a/c going. She states she has had asthma problems, used her inhaler and states she felt better. No audible wheezing. No JVD, no peripheral edema. C/o facial 'fullness'. Pulse regular with rate of 84Pt is on dexamethasone.  Advised pt to go to ED for evaluation of SOB. Pt preferred to go home and rest. States she feels ok when sitting.  Advised pt that SOB is one of the symptoms that concerns [name] and that if it worsens then she REALLY needs to go to ED and not wait until tomorrow. Pt said she is seeing Dr. Dalbert Batman in the morning for surgical consultation. Again advised pt to go to ED if SOB worsens, develops chest tightness, pressure, jaw/neck, back, arm pain. Both pt and husband voiced understanding, but wanted to go home at this point.  Alsoade pt aware of [facility] that is available during normal working hours-not open at this time (6pm)  Informed Terri, RN with Dr. Jana Hakim of above situation.

## 2015-11-16 NOTE — Telephone Encounter (Signed)
[facility] called asking for MRI breast order be updated to STAT so Dr Dalbert Batman can have results for OV in AM. Order updated.

## 2015-11-16 NOTE — Patient Instructions (Signed)

## 2015-11-17 ENCOUNTER — Other Ambulatory Visit: Payer: Self-pay | Admitting: General Surgery

## 2015-11-17 DIAGNOSIS — C50811 Malignant neoplasm of overlapping sites of right female breast: Secondary | ICD-10-CM

## 2015-11-18 ENCOUNTER — Ambulatory Visit (HOSPITAL_BASED_OUTPATIENT_CLINIC_OR_DEPARTMENT_OTHER): Payer: Medicare Other

## 2015-11-18 VITALS — BP 129/59 | HR 75 | Temp 98.3°F | Resp 18

## 2015-11-18 DIAGNOSIS — C773 Secondary and unspecified malignant neoplasm of axilla and upper limb lymph nodes: Secondary | ICD-10-CM | POA: Diagnosis not present

## 2015-11-18 DIAGNOSIS — R110 Nausea: Secondary | ICD-10-CM | POA: Diagnosis present

## 2015-11-18 DIAGNOSIS — C50411 Malignant neoplasm of upper-outer quadrant of right female breast: Secondary | ICD-10-CM

## 2015-11-18 MED ORDER — HEPARIN SOD (PORK) LOCK FLUSH 100 UNIT/ML IV SOLN
500.0000 [IU] | Freq: Once | INTRAVENOUS | Status: AC | PRN
Start: 2015-11-18 — End: 2015-11-18
  Administered 2015-11-18: 500 [IU]
  Filled 2015-11-18: qty 5

## 2015-11-18 MED ORDER — SODIUM CHLORIDE 0.9 % IJ SOLN
10.0000 mL | INTRAMUSCULAR | Status: DC | PRN
  Administered 2015-11-18: 10 mL
  Filled 2015-11-18: qty 10

## 2015-11-18 MED ORDER — SODIUM CHLORIDE 0.9 % IV SOLN
Freq: Once | INTRAVENOUS | Status: AC
  Administered 2015-11-18: 12:00:00 via INTRAVENOUS

## 2015-11-18 MED ORDER — SODIUM CHLORIDE 0.9 % IV SOLN
Freq: Once | INTRAVENOUS | Status: AC
  Administered 2015-11-18: 12:00:00 via INTRAVENOUS
  Filled 2015-11-18: qty 4

## 2015-11-18 NOTE — Progress Notes (Signed)
Skin above port area is red and irritated, no swelling. Area prepped with betadine and OpSite used for covering. Pt will apply neosporin and dsd. RN applied dsd prior to discharge. Pt was scheduled for Saturday 11/19/15 at 1400 for IVF, appt was changed to 10am.

## 2015-11-19 ENCOUNTER — Ambulatory Visit (HOSPITAL_BASED_OUTPATIENT_CLINIC_OR_DEPARTMENT_OTHER): Payer: Medicare Other

## 2015-11-19 ENCOUNTER — Ambulatory Visit: Payer: Self-pay

## 2015-11-19 VITALS — BP 135/61 | HR 80 | Temp 98.6°F | Resp 18

## 2015-11-19 DIAGNOSIS — C50411 Malignant neoplasm of upper-outer quadrant of right female breast: Secondary | ICD-10-CM

## 2015-11-19 MED ORDER — SODIUM CHLORIDE 0.9 % IV SOLN
Freq: Once | INTRAVENOUS | Status: AC
  Administered 2015-11-19: 11:00:00 via INTRAVENOUS

## 2015-11-19 MED ORDER — HEPARIN SOD (PORK) LOCK FLUSH 100 UNIT/ML IV SOLN
500.0000 [IU] | Freq: Once | INTRAVENOUS | Status: AC | PRN
  Administered 2015-11-19: 500 [IU]
  Filled 2015-11-19: qty 5

## 2015-11-19 MED ORDER — SODIUM CHLORIDE 0.9 % IJ SOLN
10.0000 mL | INTRAMUSCULAR | Status: DC | PRN
  Administered 2015-11-19: 10 mL
  Filled 2015-11-19: qty 10

## 2015-11-19 NOTE — Patient Instructions (Signed)

## 2015-11-21 ENCOUNTER — Ambulatory Visit (HOSPITAL_BASED_OUTPATIENT_CLINIC_OR_DEPARTMENT_OTHER): Payer: Medicare Other | Admitting: Oncology

## 2015-11-21 ENCOUNTER — Other Ambulatory Visit (HOSPITAL_BASED_OUTPATIENT_CLINIC_OR_DEPARTMENT_OTHER): Payer: Medicare Other

## 2015-11-21 VITALS — BP 150/68 | HR 73 | Temp 98.6°F | Resp 18 | Ht 63.0 in | Wt 168.9 lb

## 2015-11-21 DIAGNOSIS — C773 Secondary and unspecified malignant neoplasm of axilla and upper limb lymph nodes: Secondary | ICD-10-CM | POA: Diagnosis not present

## 2015-11-21 DIAGNOSIS — C50411 Malignant neoplasm of upper-outer quadrant of right female breast: Secondary | ICD-10-CM | POA: Diagnosis present

## 2015-11-21 LAB — CBC WITH DIFFERENTIAL/PLATELET
BASO%: 0.9 % (ref 0.0–2.0)
Basophils Absolute: 0 10*3/uL (ref 0.0–0.1)
EOS%: 0 % (ref 0.0–7.0)
Eosinophils Absolute: 0 10*3/uL (ref 0.0–0.5)
HCT: 32.6 % — ABNORMAL LOW (ref 34.8–46.6)
HEMOGLOBIN: 11.1 g/dL — AB (ref 11.6–15.9)
LYMPH#: 0.6 10*3/uL — AB (ref 0.9–3.3)
LYMPH%: 27.2 % (ref 14.0–49.7)
MCH: 32 pg (ref 25.1–34.0)
MCHC: 34 g/dL (ref 31.5–36.0)
MCV: 93.9 fL (ref 79.5–101.0)
MONO#: 0.7 10*3/uL (ref 0.1–0.9)
MONO%: 33.2 % — ABNORMAL HIGH (ref 0.0–14.0)
NEUT#: 0.8 10*3/uL — ABNORMAL LOW (ref 1.5–6.5)
NEUT%: 38.7 % (ref 38.4–76.8)
Platelets: 128 10*3/uL — ABNORMAL LOW (ref 145–400)
RBC: 3.47 10*6/uL — AB (ref 3.70–5.45)
RDW: 17.1 % — AB (ref 11.2–14.5)
WBC: 2.2 10*3/uL — ABNORMAL LOW (ref 3.9–10.3)
nRBC: 5 % — ABNORMAL HIGH (ref 0–0)

## 2015-11-21 LAB — COMPREHENSIVE METABOLIC PANEL
ALBUMIN: 3.2 g/dL — AB (ref 3.5–5.0)
ALK PHOS: 74 U/L (ref 40–150)
ALT: 14 U/L (ref 0–55)
AST: 13 U/L (ref 5–34)
Anion Gap: 10 mEq/L (ref 3–11)
BUN: 10.3 mg/dL (ref 7.0–26.0)
CHLORIDE: 98 meq/L (ref 98–109)
CO2: 26 meq/L (ref 22–29)
Calcium: 9.1 mg/dL (ref 8.4–10.4)
Creatinine: 0.8 mg/dL (ref 0.6–1.1)
EGFR: 77 mL/min/{1.73_m2} — ABNORMAL LOW (ref >90)
GLUCOSE: 112 mg/dL (ref 70–140)
POTASSIUM: 4.8 meq/L (ref 3.5–5.1)
SODIUM: 134 meq/L — AB (ref 136–145)
Total Bilirubin: 0.52 mg/dL (ref 0.20–1.20)
Total Protein: 6.3 g/dL — ABNORMAL LOW (ref 6.4–8.3)

## 2015-11-21 NOTE — Progress Notes (Signed)
[facility]  [phone] [phone]     ID: [name] DOB: 22-Sep-1945  MR#: [id number]  KVQ#:[id number]  Patient Care Team: Seward Carol, MD as PCP - General (Internal Medicine) Chauncey Cruel, MD as Consulting Physician (Oncology) Fanny Skates, MD as Consulting Physician (General Surgery) Luberta Robertson, MD (Radiology) Hennie Duos, MD as Consulting Physician (Rheumatology) Domingo Pulse, MD (Urology) Druscilla Brownie, MD as Consulting Physician (Dermatology) PCP: Kandice Hams, MD GYN: OTHER MD:  CHIEF COMPLAINT: Locally advanced breast cancer  CURRENT TREATMENT: awaiting definitive surgery  BREAST CANCER HISTORY: From the original intake note:  [name] had routine mammographic screening in 08/18/2015 at [facility], showing a right breast mass and possible axillary adenopathy. On 08/23/2015 she underwent bilateral diagnostic mammography with tomography and bilateral breast ultrasonography. The breast density was category B in the right breast there was a spiculated mass in the upper-outer quadrant measuring 1.2 cm and associated with pleomorphic calcifications. There were calcifications extending anteriorly and posteriorly from the mass worrisome for ductal carcinoma in situ. The total area in question is about 3 cm. There were also multiple enlarged right axillary lymph nodes. The mass was palpated as an area of fullness at the 12:00 position of the breast 3 cm from the nipple. There were palpable enlarged right axillary lymph nodes in the right axilla. Ultrasonography of the right breast confirmed an irregular hypoechoic mass in the area in question measuring 1.6 cm. There were multiple abnormal appearing enlarged right axillary lymph nodes, the largest measuring 2.3 cm.  In the left breast, mammography showed an area of architectural distortion associated with the left nipple. This was not palpable. Ultrasonography of the left  breast found no correlate. This area of the left breast was biopsied under tomography 08/24/2015. This showed (SAA [id number]) a complex sclerosing lesion and fibroadenoma.  On 08/23/2015 the patient underwent biopsy of the right breast mass in 1 of the abnormal right axillary lymph nodes. 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 Both biopsies were HER-2 nonamplified, the breast being ratio 1.27 and number per cell 1.90, and the lymph node 1 ratio 1.24 and number per cell 3.0.  On 08/26/2015 the patient underwent bilateral breast MRI. In the left breast there was no suspicious enhancement. The left axilla was benign. In the right breast there was a mass described as in the upper inner quadrant measuring 2.3 cm. Less than a centimeter away from this mass there was a satellite nodule measuring 0.9 cm. In the right axilla there were multiple enlarged right axillary lymph nodes, including a confluent region measuring up to 4 cm. There were also enlarged level II lymph nodes present measuring up to 2.1 cm.  The patient's subsequent history is as detailed below  INTERVAL HISTORY: Dolorez returns today for follow up of her breast cancer, accompanied by her husband [name]. Today is day 8 cycle 4 of 4 planned cycles of cyclophosphamide and docetaxel.   Since her last visit here she is had a repeat breast MRI which shows a very favorable but not complete response  REVIEW OF SYSTEMS: As with prior cycles, [name] again developed a rash and required a Medrol Dosepak. She continues to tear. She was very short of breath and her legs felt weak. She came here for IV fluids and that helped some. She has been  taking more Pepcid and that is helping as well. She does have surrounding nose and a bit of a dry cough. She short of breath particularly when walking upstairs.  She bruises easily. She continues to have pain "all over" and also now some numbness in her finger and toe tips, which is something that did develop with prior cycles but usually had resolved by this point. She is having hot flashes. A detailed review of systems today was otherwise stable  PAST MEDICAL HISTORY: Past Medical History:  Diagnosis Date  . Asthma   . Cancer [facility])    breast cancer  . Chronic vaginitis   . Depression   . Fibromyalgia   . Hypoglycemia   . Interstitial cystitis   . Menopausal symptoms   . Osteoporosis   . Pneumonia   . PONV (postoperative nausea and vomiting)   . Thyroid disease    hypothyroid    PAST SURGICAL HISTORY: Past Surgical History:  Procedure Laterality Date  . BLADDER HYPEREXTENSION     X4  . BUNIONECTOMY    . [facility]     DR. INGRAM  . CHOLECYSTECTOMY  01/2006  . COLONOSCOPY    . DENTAL SURGERY     13 dental implants  . LEG SURGERY  06/2004   Broken tibia  . MOUTH SURGERY    . NASAL SINUS SURGERY     X4  . PORTACATH PLACEMENT Left 09/08/2015   Procedure: INSERTION OF PORT-A-CATH ;  Surgeon: Fanny Skates, MD;  [facility] OR;  Service: General;  Laterality: Left;    FAMILY HISTORY Family History  Problem Relation Age of Onset  . Hypertension Mother   . Heart disease Mother   . Parkinsonism Father   . Heart disease Sister   . Cancer Sister     MELANOMA  . Breast cancer Sister     Age 71  The patient's father died at age 43 from complications of Parkinson's disease. The patient's mother died age 6 with heart disease. [name] had no brothers. She had 2 sisters. One sister was diagnosed with breast cancer at age 61. She was tested for the BRCA gene and was negative. The same sister also had melanoma diagnosed in her 6s. The patient has one maternal great aunt diagnosed with breast cancer around age 58. There is no history of ovarian cancer in the family  GYNECOLOGIC HISTORY:  No LMP recorded. Patient is  postmenopausal. Menarche age 70, first live birth age 70. The patient is GX P2. She stopped having periods in her early 19s and took hormone replacement until her breast cancer diagnosis, April 2017.  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 He has been in remission for more than 10 years. Their daughter Nira Conn lives in [place] were she is Production designer, theatre/television/film of the [employer]. Daughter Belenda Cruise is an infectious disease M.D. working and teaching at [employer] in [place]. The patient has one granddaughter, 27 months old as of May 2017. The family attends [employer]: Not in place   HEALTH MAINTENANCE: Social History  Substance Use Topics  . Smoking status: Former Smoker    Types: Cigarettes  . Smokeless tobacco: Never Used  . Alcohol use 0.0 oz/week     Comment: social     Colonoscopy: 2016/  [facility]  PAP: 2015  Bone density:   Lipid panel:  Allergies  Allergen Reactions  . Fluconazole     REACTION: elavated LFTs  . Nitrofurantoin Other (See Comments)    Unknown reaction  . Penicillins Other (See Comments)    Has patient had a PCN reaction causing immediate rash, facial/tongue/throat swelling, SOB or lightheadedness with hypotension: No Has patient had a PCN reaction causing severe rash involving mucus membranes or skin necrosis: No Has patient had a PCN reaction that required hospitalization No Has patient had a PCN reaction occurring within the last 10 years: No If all of the above answers are "NO", then may proceed with Cephalosporin use.  . Other Rash    Adhesive tape    Current Outpatient Prescriptions  Medication Sig Dispense Refill  . buPROPion (WELLBUTRIN XL) 300 MG 24 hr tablet Take 300 mg by mouth daily.      . calcitonin, salmon,  (MIACALCIN/FORTICAL) 200 UNIT/ACT nasal spray Place 1 spray into alternate nostrils daily. 3.7 mL 11  . Calcium Carbonate-Vitamin D (CALCIUM + D PO) Take 1 tablet by mouth daily.     . clotrimazole (MYCELEX) 10 MG troche Take 1 tablet (10 mg total) by mouth 5 (five) times daily. 150 tablet 0  . cycloSPORINE (RESTASIS) 0.05 % ophthalmic emulsion Place 1 drop into both eyes 2 (two) times daily. 5.5 mL 0  . diazepam (VALIUM) 5 MG tablet Take 5 mg by mouth every 6 (six) hours as needed for anxiety.     . fluconazole (DIFLUCAN) 100 MG tablet Take 1 tablet (100 mg total) by mouth daily. 30 tablet 0  . hydrOXYzine (ATARAX/VISTARIL) 25 MG tablet Take 1 tablet (25 mg total) by mouth 3 (three) times daily as needed for anxiety or itching. 30 tablet 0  . ketoconazole (NIZORAL) 2 % cream Apply 1 application topically daily. 15 g 0  . Levothyroxine Sodium (SYNTHROID PO) Take 137 mcg by mouth daily.     Marland Kitchen lidocaine-prilocaine (EMLA) cream Apply to affected area once (Patient taking differently: Apply 1 application topically as needed. Apply to affected area once) 30 g 3  . LORazepam (ATIVAN) 0.5 MG tablet Take 1 tablet (0.5 mg total) by mouth at bedtime as needed (Nausea or vomiting). 30 tablet 0  . methylPREDNISolone (MEDROL DOSEPAK) 4 MG TBPK tablet Take as directed 1 tablet 0  . methylPREDNISolone (MEDROL DOSEPAK) 4 MG TBPK tablet Take as directed 21 tablet 0  . Multiple Vitamin (MULTIVITAMIN) capsule Take 1 capsule by mouth daily.      . nabumetone (RELAFEN) 750 MG tablet Take 750 mg by mouth 2 (two) times daily as needed for mild pain.    Marland Kitchen oxyCODONE-acetaminophen (PERCOCET/ROXICET) 5-325 MG tablet Take 1 tablet by mouth every 8 (eight) hours as needed for severe pain. 30 tablet 0  . pantoprazole (PROTONIX) 40 MG tablet Take 40 mg by mouth daily.    . prochlorperazine (COMPAZINE) 10 MG tablet Take 1 tablet (10 mg total) by mouth every 6 (six) hours as needed (Nausea or vomiting). 30 tablet 1  .  Triamterene-HCTZ (DYAZIDE PO) Take 1 tablet by mouth daily.     . valACYclovir (VALTREX) 500 MG tablet Take 1 tablet (500 mg total) by mouth 2 (two) times daily. 60 tablet 1  . Vitamin D, Ergocalciferol, (DRISDOL) 50000 UNITS CAPS capsule One tablet every other week (Patient taking differently: Take 50,000 Units by mouth every 14 (fourteen) days. One tablet every other week) 10 capsule 3  . zolpidem (AMBIEN)  10 MG tablet Take 10 mg by mouth at bedtime as needed for sleep.      No current facility-administered medications for this visit.     OBJECTIVE: Middle-aged white woman who appears stated age 70:   11/21/15 1542  BP: (!) 150/68  Pulse: 73  Resp: 18  Temp: 98.6 F (37 C)     Body mass index is 29.92 kg/m.    ECOG FS:1 - Symptomatic but completely ambulatory  Sclerae unicteric, EOMs intact, epiphora L > R Oropharynx clear and moist No cervical or supraclavicular adenopathy Lungs no rales or rhonchi Heart regular rate and rhythm Abd soft, nontender, positive bowel sounds MSK no focal spinal tenderness, no upper extremity lymphedema Neuro: nonfocal, well oriented, appropriate affect Breasts: Deferred    LAB RESULTS:  CMP     Component Value Date/Time   NA 134 (L) 11/21/2015 1527   K 4.8 11/21/2015 1527   CO2 26 11/21/2015 1527   GLUCOSE 112 11/21/2015 1527   BUN 10.3 11/21/2015 1527   CREATININE 0.8 11/21/2015 1527   CALCIUM 9.1 11/21/2015 1527   PROT 6.3 (L) 11/21/2015 1527   ALBUMIN 3.2 (L) 11/21/2015 1527   AST 13 11/21/2015 1527   ALT 14 11/21/2015 1527   ALKPHOS 74 11/21/2015 1527   BILITOT 0.52 11/21/2015 1527    INo results found for: SPEP, UPEP  Lab Results  Component Value Date   WBC 2.2 (L) 11/21/2015   NEUTROABS 0.8 (L) 11/21/2015   HGB 11.1 (L) 11/21/2015   HCT 32.6 (L) 11/21/2015   MCV 93.9 11/21/2015   PLT 128 (L) 11/21/2015      Chemistry      Component Value Date/Time   NA 134 (L) 11/21/2015 1527   K 4.8 11/21/2015 1527   CO2 26  11/21/2015 1527   BUN 10.3 11/21/2015 1527   CREATININE 0.8 11/21/2015 1527      Component Value Date/Time   CALCIUM 9.1 11/21/2015 1527   ALKPHOS 74 11/21/2015 1527   AST 13 11/21/2015 1527   ALT 14 11/21/2015 1527   BILITOT 0.52 11/21/2015 1527       No results found for: LABCA2  No components found for: [id number]  No results for input(s): INR in the last 168 hours.  Urinalysis No results found for: COLORURINE, APPEARANCEUR, LABSPEC, PHURINE, GLUCOSEU, HGBUR, BILIRUBINUR, KETONESUR, PROTEINUR, UROBILINOGEN, NITRITE, LEUKOCYTESUR    ELIGIBLE FOR AVAILABLE RESEARCH PROTOCOL: PALLAS  STUDIES: Mr Breast Bilateral W Wo Contrast  Result Date: 11/16/2015 CLINICAL DATA:  Patient initially diagnosed with invasive and in situ ductal carcinoma in the right breast at the 12 o'clock axis. Subsequent MRI-guided biopsy revealed additional invasive carcinoma in the inner right breast (slightly inferior and medial to the initially diagnosed carcinoma). Patient is status post chemotherapy. Patient also status post stereotactic-guided biopsy of the left breast, 1 o'clock axis, with pathology result of complex sclerosing lesion. LABS:  Not applicable EXAM: BILATERAL BREAST MRI WITH AND WITHOUT CONTRAST TECHNIQUE: Multiplanar, multisequence MR images of both breasts were obtained prior to and following the intravenous administration of 15 ml of MultiHance. THREE-DIMENSIONAL MR IMAGE RENDERING ON INDEPENDENT WORKSTATION: Three-dimensional MR images were rendered by post-processing of the original MR data on an independent workstation. The three-dimensional MR images were interpreted, and findings are reported in the following complete MRI report for this study. Three dimensional images were evaluated at the independent DynaCad workstation COMPARISON:  Breast MRI dated 08/26/2015. Breast MRI biopsy dated 09/13/2015. Previous mammograms including diagnostic mammograms dated 08/24/2015 and  09/13/2015.  FINDINGS: Breast composition: b. Scattered fibroglandular tissue. Background parenchymal enhancement: Mild Right breast: The irregular mass within the upper right breast, 12-1 o'clock axis region, at anterior depth, with biopsy clip artifact, consistent with patient's known carcinoma (patient's first confirmed carcinoma), has significantly decreased in size, now measuring 0.8 x 0.4 x 0.8 cm (previously measured at 2.3 x 2.3 x 1.2 cm) (series 6, image 114). There is a postoperative seroma within the inner right breast, measuring 2.4 x 1.6 cm, with adjacent biopsy clip artifact, corresponding to the second confirmed site of carcinoma in the right breast. There is no persistent abnormal mass or non-mass enhancement at this second site of biopsy-proven carcinoma (previously an irregular enhancing mass measuring 0.9 x 0.7 cm). There are no new areas of suspicious mass or non-mass enhancement within the right breast. Left breast: Post biopsy change noted within the outer left breast, with associated biopsy clip artifact, corresponding to site of biopsy-proven complex sclerosing lesion. There is also decreased enhancement at this biopsy site, with only a 3 mm focus of residual contrast enhancement adjacent to the biopsy clip artifact. No new enhancing mass or non-mass enhancement within the left breast. Lymph nodes: Significantly decreased size of the right axillary lymph nodes, including the largest lymph node which contains a biopsy clip corresponding to the biopsy-proven lymph node metastasis. No new or intervally enlarged lymph nodes in the right axilla. No enlarged or morphologically abnormal lymph nodes identified within the left axilla or bilateral internal mammary chain regions. Ancillary findings:  None. IMPRESSION: 1. Overall good response to interval treatment, as follows. 2. Biopsy-proven carcinoma within the upper RIGHT breast, 12-1 o'clock axis region, is significantly decreased in size with a current  measurement of 0.8 x 0.4 x 0.8 cm (previously 2.3 x 2.3 x 1.2 cm). 3. Complete MRI response at the second site of biopsy-proven carcinoma within the inner RIGHT breast (no persistent mass or non-mass enhancement at this second site of biopsy-proven carcinoma). 4. Significantly decreased size of all lymph nodes in the RIGHT axilla, including the lymph node which contains the biopsy clip corresponding to the biopsy-proven lymph node metastasis. 5. Decreased size/extent of the enhancement at the site of biopsy-proven complex sclerosing lesion in the LEFT breast. RECOMMENDATION: Per current treatment plan. BI-RADS CATEGORY  6: Known biopsy-proven malignancy. Electronically Signed   By: Franki Cabot M.D.   On: 11/16/2015 15:35    ASSESSMENT: 70 y.o. [place] woman status post right breast upper outer quadrant and right axillary lymph node biopsy 08/23/2015, both positive for a clinical T1 N2, stage IIIA invasive ductal carcinoma, grade 2, strongly estrogen receptor positive, 5% progesterone receptor positive, with an MIB-120-30% and no HER-2 amplification  (a) biopsy of a 0.9 cm Right breast satellite nodule 09/13/2015 showed invasive ductal carcinoma, grade 2, E-cadherin strongly positive, with a prognostic panel identical to the larger right upper quadrant mass  (1) left breast biopsy 08/24/2015 shows a complex sclerosing lesion  (2) neoadjuvant chemotherapy consisting of cyclophosphamide and docetaxel every 21 days 4, with OnPro support, started 09/12/2015, completed 11/14/2015  (3) definitive surgery to follow  (4) adjuvant radiation to follow surgery  (5) anti-estrogens for 10 years to follow at the completion of local treatment-- consider PALLAS trial  (6) genetics testing pending (Askenazi descent)  PLAN: Ketty has completed her neoadjuvant chemotherapy and has had a very good response. She has not had a complete radiologic response and in fact complete radiologic/pathologic responses are  unusual in estrogen receptor positive patients. I  am very pleased with how well she has done overall.  She is now ready to proceed to surgery. She hopes this will be done early August. She will see me the first week in September chiefly to review the pathology report and to start planning anti-estrogen therapy. Of course once she recovers from her surgery she will start radiation  We reviewed neutropenic precautions. I do not feel she needs prophylactic antibiotics at this point.  She knows to call for any problems that may develop before her next visit here.   Chauncey Cruel, MD  11/21/2015

## 2015-11-23 ENCOUNTER — Other Ambulatory Visit: Payer: Self-pay | Admitting: General Surgery

## 2015-11-23 DIAGNOSIS — C50811 Malignant neoplasm of overlapping sites of right female breast: Secondary | ICD-10-CM

## 2015-11-24 ENCOUNTER — Other Ambulatory Visit: Payer: Self-pay | Admitting: General Surgery

## 2015-11-24 DIAGNOSIS — C50811 Malignant neoplasm of overlapping sites of right female breast: Secondary | ICD-10-CM

## 2015-11-25 ENCOUNTER — Encounter: Payer: Self-pay | Admitting: Radiation Oncology

## 2015-12-01 ENCOUNTER — Encounter (HOSPITAL_COMMUNITY)
Admission: RE | Admit: 2015-12-01 | Discharge: 2015-12-01 | Disposition: A | Discharge status: Home / Self Care | Payer: Medicare Other | Source: Ambulatory Visit | Attending: General Surgery | Admitting: General Surgery

## 2015-12-01 ENCOUNTER — Encounter (HOSPITAL_COMMUNITY): Payer: Self-pay

## 2015-12-01 DIAGNOSIS — Z01818 Encounter for other preprocedural examination: Secondary | ICD-10-CM | POA: Diagnosis present

## 2015-12-01 DIAGNOSIS — C773 Secondary and unspecified malignant neoplasm of axilla and upper limb lymph nodes: Secondary | ICD-10-CM | POA: Diagnosis not present

## 2015-12-01 DIAGNOSIS — C50911 Malignant neoplasm of unspecified site of right female breast: Secondary | ICD-10-CM | POA: Diagnosis not present

## 2015-12-01 DIAGNOSIS — R9431 Abnormal electrocardiogram [ECG] [EKG]: Secondary | ICD-10-CM | POA: Diagnosis not present

## 2015-12-01 DIAGNOSIS — Z01812 Encounter for preprocedural laboratory examination: Secondary | ICD-10-CM | POA: Diagnosis not present

## 2015-12-01 HISTORY — DX: Hypothyroidism, unspecified: E03.9

## 2015-12-01 HISTORY — DX: Unspecified osteoarthritis, unspecified site: M19.90

## 2015-12-01 HISTORY — DX: Gastro-esophageal reflux disease without esophagitis: K21.9

## 2015-12-01 LAB — BASIC METABOLIC PANEL WITH GFR
Anion gap: 11 (ref 5–15)
BUN: 9 mg/dL (ref 6–20)
CO2: 27 mmol/L (ref 22–32)
Calcium: 9.4 mg/dL (ref 8.9–10.3)
Chloride: 98 mmol/L — ABNORMAL LOW (ref 101–111)
Creatinine, Ser: 0.86 mg/dL (ref 0.44–1.00)
GFR calc Af Amer: >60 mL/min (ref >60)
GFR calc non Af Amer: >60 mL/min (ref >60)
Glucose, Bld: 101 mg/dL — ABNORMAL HIGH (ref 65–99)
Potassium: 3.4 mmol/L — ABNORMAL LOW (ref 3.5–5.1)
Sodium: 136 mmol/L (ref 135–145)

## 2015-12-01 LAB — DIFFERENTIAL
Basophils Absolute: 0 10*3/uL (ref 0.0–0.1)
Basophils Relative: 0 %
EOS ABS: 0 10*3/uL (ref 0.0–0.7)
EOS PCT: 0 %
LYMPHS ABS: 2 10*3/uL (ref 0.7–4.0)
LYMPHS PCT: 12 %
MONO ABS: 1.5 10*3/uL — AB (ref 0.1–1.0)
Monocytes Relative: 9 %
Neutro Abs: 13.3 10*3/uL — ABNORMAL HIGH (ref 1.7–7.7)
Neutrophils Relative %: 79 %

## 2015-12-01 LAB — CBC
HEMATOCRIT: 37.3 % (ref 36.0–46.0)
HEMOGLOBIN: 12.3 g/dL (ref 12.0–15.0)
MCH: 32.1 pg (ref 26.0–34.0)
MCHC: 33 g/dL (ref 30.0–36.0)
MCV: 97.4 fL (ref 78.0–100.0)
Platelets: 299 10*3/uL (ref 150–400)
RBC: 3.83 MIL/uL — AB (ref 3.87–5.11)
RDW: 17.8 % — ABNORMAL HIGH (ref 11.5–15.5)
WBC: 16.7 10*3/uL — ABNORMAL HIGH (ref 4.0–10.5)

## 2015-12-01 LAB — HEPATIC FUNCTION PANEL
ALT: 13 U/L — ABNORMAL LOW (ref 14–54)
AST: 18 U/L (ref 15–41)
Albumin: 3.7 g/dL (ref 3.5–5.0)
Alkaline Phosphatase: 65 U/L (ref 38–126)
Bilirubin, Direct: <0.1 mg/dL — ABNORMAL LOW (ref 0.1–0.5)
Total Bilirubin: 0.3 mg/dL (ref 0.3–1.2)
Total Protein: 6.2 g/dL — ABNORMAL LOW (ref 6.5–8.1)

## 2015-12-01 NOTE — Progress Notes (Signed)
PCP: Dr. Seward Carol @ [facility]

## 2015-12-01 NOTE — Pre-Procedure Instructions (Signed)
    [name]  12/01/2015      [id number] - [place], [place] - [address] [place] [address] Phone: [phone] Fax: [phone]  [place], [place] [address] Phone: [phone] Fax: [phone]  [facility] [place], [place] - [place] AT [facility] [address] [place] [address] Phone: [phone] Fax: [phone]  [facility] [place], [place] [address] Phone: [phone] Fax: [phone]    Your procedure is scheduled on Mon. Aug. 7  Report to [facility] Admitting at 10:30 A.M.  Call this number if you have problems the morning of surgery:  [phone]   Remember:  Do not eat food or drink liquids after midnight.  Take these medicines the morning of surgery with A SIP OF WATER : albuterol inhaler if needed, bring to hospital, bupropion (wellbutrin), carisoprodol(soma) if needed, restasis, valium if needed, hydroxyzine if needed, levothyroxine(synthroid), compazine if needed, vicodin if needed             Stop vitamins/herbal medicines and NSAIDS: aspirin, ibuprofen, motrin, aleve, BC, Goody's.   Do not wear jewelry, make-up or nail polish.  Do not wear lotions, powders, or perfumes.  You may not wear deoderant.  Do not shave 48 hours prior to surgery.  Men may shave face and neck.  Do not bring valuables to the hospital.  [facility] is not responsible for any belongings or valuables.  Contacts, dentures or bridgework may not be worn into surgery.  Leave your suitcase in the car.  After surgery it may be brought to your room.  For patients admitted to the hospital, discharge time will be determined by your treatment team.  Patients discharged the day  of surgery will not be allowed to drive home.     Special instructions: review preparing for surgery  Please read over the following fact sheets that you were given. Coughing and Deep Breathing

## 2015-12-02 NOTE — Progress Notes (Addendum)
Anesthesia Chart Review:  Pt is a 70 year old female scheduled for L breast lumpectomy with radioactive seed localization, R breast lumpectomy with needle localization and axillary lymph node dissection, removal of port-a-cath on 12/05/2015 with Fanny Skates, MD.   PMH includes:  Asthma, hypothyroidism, post-op N/V, GERD. Former smoker. BMI 25.5  Medications include: albuterol, protonix, triamterene-hctz  Preoperative labs reviewed.  WBC 16.7. Dr. Dalbert Batman is aware.   EKG 12/01/15: NSR. Nonspecific ST abnormality.  If no changes, I anticipate pt can proceed with surgery as scheduled.   Willeen Cass, FNP-BC [facility]/Anesthesiology Phone: [phone] 12/02/2015 3:48 PM

## 2015-12-04 MED ORDER — VANCOMYCIN HCL 10 G IV SOLR
1500.0000 mg | INTRAVENOUS | Status: AC
  Administered 2015-12-05: 1500 mg via INTRAVENOUS
  Filled 2015-12-04: qty 1500

## 2015-12-04 NOTE — H&P (Signed)
[name] Location: [facility] Patient #: [id number] DOB: 18-May-1945 Married / Language: English / Race: White Female        History of Present Illness  The patient is a 70 year old female who presents with breast cancer. This is a very pleasant 70 year old Caucasian female who returns following neoadjuvant chemotherapy for decision-making report regarding definitive surgery of her right breast and left breast. Dr. Jana Hakim is her oncologist. Dr. Alona Bene is her urologist. Dr. Jori Moll polite is her PCP.  Initially evaluated in April of this year. She was found to have 2 adjacent cancers in the right breast and bulky right axillary adenopathy as well as a complex coursing lesion of the left breast. Mammogram showed that the index cancer was 1.6 cm but the MRI said it was 2.3 cm. 12 o'clock position 3 cm from the nipple. Multiple bulky abnormal right axillary lymph nodes. MRI showed a satellite lesion 5 mm away. Same quadrant. Both of these were biopsied. Axilla was biopsied. All 3 areas were positive for invasive carcinoma, strongly estrogen receptor positive. HER-2/neu negative. She also had a left breast biopsy which showed a complex coursing lesion and we agreed that we would remove that after chemotherapy. She has done well but still has a lot of anxiety and a huge amount of questions. Dr. Jana Hakim says he is treated with chemotherapy and we may remove the port.  End of treatment MRI on November 16, 2015 shows that the cancer in the right breast has gone from 2.3-0.8 cm. Secondly there is a complete MRI response of the immediately adjacent satellite cancer. Third all of the right axillary lymph nodes are much smaller in size. Interestingly, the complex coursing lesion the left breast is smaller and is now only 3 mm.  Genetics have been recommended but she has not gotten that test yet but says she will later.  We talked about her surgical options. She  knows will remove the Port-A-Cath. She knows recommended do a lumpectomy on the left side with radioactive seed. She knows that she needs a complete right axillary lymph node dissection because of the bulky adenopathy and we talked about the side effects of that. We talked about the options of lumpectomy or mastectomy on the right breath, breast, both of which are national guidelines. To do a lumpectomy would we would have to place 2 wires the morning of surgery and there may be some volume loss which may be noticeable or may be minor. She doesn't really care about the cosmetic defect repaired. We talked about mastectomy. I told her that that might slightly reduce the recurrence risk but there was no survival advantage. At the end we decided to go ahead with lumpectomy on the right.  She will be scheduled for Port-A-Cath removal, left breast lumpectomy with radioactive seed localization, right breast lumpectomy with double wire localization, right axillary lymph node dissection. She knows she'll have a drain no she'll have to spend the night in the hospital. I discussed the indications, details, techniques, and numerous risk of the surgery with the patient and her husband. She is aware of the risk of bleeding, infection, reoperation for positive margins or other complications, nerve damage with chronic pain volume loss requiring reconstruction and other unforeseen problems. She understands all of these issues. All of her questions were answered. She agrees with this plan.   Other Problems  Anxiety Disorder Arthritis Asthma Back Pain Bladder Problems Breast Cancer Cholelithiasis Depression Gastroesophageal Reflux Disease General anesthesia -  complications Hemorrhoids Lump In Breast Thyroid Disease  Past Surgical History  No pertinent past surgical history  Diagnostic Studies History  Colonoscopy 1-5 years ago Mammogram within last year Pap Smear 1-5 years  ago  Allergies  Penicillin G Pot in Dextrose *PENICILLINS* Diflucan *ANTIFUNGALS* Macrodantin *Urinary Anti-infectives**  Medication History  Percocet (10-325MG Tablet, 1 (one) Tablet Oral every four hours, as needed, Taken starting 09/13/2015) Active. Hydrocodone-Acetaminophen (10-325MG Tablet, Oral) Active. Meperidine HCl (50MG Tablet, Oral) Active. Carisoprodol (350MG Tablet, Oral) Active. DiazePAM (5MG Tablet, Oral) Active. LORazepam (2MG Tablet, Oral) Active. Azithromycin (250MG Tablet, Oral) Active. BuPROPion HCl ER (XL) (300MG Tablet ER 24HR, Oral) Active. Calcitonin (Salmon) (200UNIT/ACT Solution, Nasal) Active. Clindamycin HCl (150MG Capsule, Oral) Active. HydrOXYzine HCl (25MG Tablet, Oral) Active. Pantoprazole Sodium (40MG Tablet DR, Oral) Active. PredniSONE (10MG (21) Tab Ther Pack, Oral) Active. Promethazine HCl (25MG Tablet, Oral) Active. Sulfamethoxazole-Trimethoprim (800-160MG Tablet, Oral) Active. Synthroid (137MCG Tablet, Oral) Active. Triamterene-HCTZ (75-50MG Tablet, Oral) Active. ValACYclovir HCl (1GM Tablet, Oral) Active. Vitamin D (Ergocalciferol) (50000UNIT Capsule, Oral) Active. Medications Reconciled  Social History  Alcohol use Occasional alcohol use. Caffeine use Coffee, Tea. No drug use Tobacco use Former smoker.  Family History  Arthritis Mother, Sister. Breast Cancer Sister. Depression Sister. Heart Disease Mother. Hypertension Mother. Melanoma Sister. Migraine Headache Daughter. Respiratory Condition Daughter. Thyroid problems Sister.  Pregnancy / Birth History  Age at menarche 29 years. Age of menopause <44 Gravida 2 Maternal age 71-30 Para 2    Review of Systems  General Not Present- Appetite Loss, Chills, Fatigue, Fever, Night Sweats, Weight Gain and Weight Loss. Skin Not Present- Change in Wart/Mole, Dryness, Hives, Jaundice, New Lesions, Non-Healing Wounds, Rash and Ulcer. HEENT  Present- Ringing in the Ears and Seasonal Allergies. Not Present- Earache, Hearing Loss, Hoarseness, Nose Bleed, Oral Ulcers, Sinus Pain, Sore Throat, Visual Disturbances, Wears glasses/contact lenses and Yellow Eyes. Respiratory Not Present- Bloody sputum, Chronic Cough, Difficulty Breathing, Snoring and Wheezing. Breast Present- Breast Mass. Not Present- Breast Pain, Nipple Discharge and Skin Changes. Cardiovascular Not Present- Chest Pain, Difficulty Breathing Lying Down, Leg Cramps, Palpitations, Rapid Heart Rate, Shortness of Breath and Swelling of Extremities. Gastrointestinal Not Present- Abdominal Pain, Bloating, Bloody Stool, Change in Bowel Habits, Chronic diarrhea, Constipation, Difficulty Swallowing, Excessive gas, Gets full quickly at meals, Hemorrhoids, Indigestion, Nausea, Rectal Pain and Vomiting. Female Genitourinary Present- Frequency. Not Present- Nocturia, Painful Urination, Pelvic Pain and Urgency. Musculoskeletal Present- Back Pain, Joint Pain and Muscle Pain. Not Present- Joint Stiffness, Muscle Weakness and Swelling of Extremities. Neurological Not Present- Decreased Memory, Fainting, Headaches, Numbness, Seizures, Tingling, Tremor, Trouble walking and Weakness. Psychiatric Present- Anxiety. Not Present- Bipolar, Change in Sleep Pattern, Depression, Fearful and Frequent crying. Endocrine Present- Heat Intolerance and Hot flashes. Not Present- Cold Intolerance, Excessive Hunger, Hair Changes and New Diabetes. Hematology Present- Easy Bruising. Not Present- Excessive bleeding, Gland problems, HIV and Persistent Infections.  Vitals  Weight: 173 lb Height: 63.5in Body Surface Area: 1.83 m Body Mass Index: 30.16 kg/m  Temp.: 97.35F(Temporal)  Pulse: 115 (Regular)  BP: 142/82 (Sitting, Left Arm, Standard)       Physical Exam General Mental Status-Alert. General Appearance-Not in acute distress. Build & Nutrition-Well nourished. Posture-Normal  posture. Gait-Normal.  Head and Neck Head-normocephalic, atraumatic with no lesions or palpable masses. Trachea-midline. Thyroid Gland Characteristics - normal size and consistency and no palpable nodules.  Chest and Lung Exam Chest and lung exam reveals -on auscultation, normal breath sounds, no adventitious sounds and normal vocal resonance.  Breast Note: The right  breast reveals a smooth density at 12:00 which is consistent with a seroma seen on MRI. There is no other mass in either breast. There is no other skin change. The right axillary adenopathy is not palpable now. Breasts are medium size. Large areola.   Cardiovascular Cardiovascular examination reveals -normal heart sounds, regular rate and rhythm with no murmurs and femoral artery auscultation bilaterally reveals normal pulses, no bruits, no thrills.  Abdomen Inspection Inspection of the abdomen reveals - No Hernias. Palpation/Percussion Palpation and Percussion of the abdomen reveal - Soft, Non Tender, No Rigidity (guarding), No hepatosplenomegaly and No Palpable abdominal masses. Note: Laparoscopic scars from cholecystectomy.   Neurologic Neurologic evaluation reveals -alert and oriented x 3 with no impairment of recent or remote memory, normal attention span and ability to concentrate, normal sensation and normal coordination.  Musculoskeletal Normal Exam - Bilateral-Upper Extremity Strength Normal and Lower Extremity Strength Normal.    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 Jana Hakim says we may remove the port.  Your MRI looks good. The primary tumor in the right breast is one third the size of it was before, and the second tumor in the right breast which is immediately adjacent to the primary tumor does not light up any more. You do have a seroma and fluid collection in the breast which I think accounts for  the swelling we feel The lymph nodes in your right axilla are much smaller and I cannot feel them today The hopefully benign area of radial scar in the left breast also looks smaller, but this still needs to be excised  We have discussed the options of mastectomy and right axillary lymph node dissection and compared that to right lumpectomy with double wire localization and right axillary lymph node dissection. If you choose mastectomy reconstruction can be considered, but since you are going to get radiation therapy that would probably be delayed. We have offered to refer you to plastic surgery, but since you have chosen lumpectomy were going to put that off for now.  You are aware that with lumpectomy we will need to place 2 wires in the right breast the day of surgery and will need to place a radioactive seed in the left breast a couple of days preop You are aware that we are going to do a complete right axillary lymph node dissection because of the amount of lymph node disease that you had prior to chemotherapy We have discussed the risks of axillary lymph node dissection including possible arm swelling and possible arm numbness. We have discussed the indications, techniques, and numerous risks of lumpectomy on both sides, Port-A-Cath removal and right axillary lymph node dissection  We will schedule this surgery in about 3 weeks to be sure that you have recovered from your chemotherapy.  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 Dalbert Batman, M.D., [facility] General and Minimally invasive Surgery Breast and Colorectal Surgery Office:   [phone] Pager:   [phone]

## 2015-12-05 ENCOUNTER — Ambulatory Visit (HOSPITAL_COMMUNITY)
Admission: RE | Admit: 2015-12-05 | Discharge: 2015-12-06 | Disposition: A | Discharge status: Home / Self Care | Payer: Medicare Other | Source: Ambulatory Visit | Attending: General Surgery | Admitting: General Surgery

## 2015-12-05 ENCOUNTER — Encounter (HOSPITAL_COMMUNITY): Payer: Self-pay | Admitting: *Deleted

## 2015-12-05 ENCOUNTER — Encounter (HOSPITAL_COMMUNITY)
Admission: RE | Disposition: A | Discharge status: Home / Self Care | Payer: Self-pay | Source: Ambulatory Visit | Attending: General Surgery

## 2015-12-05 ENCOUNTER — Ambulatory Visit
Admission: RE | Admit: 2015-12-05 | Discharge: 2015-12-05 | Disposition: A | Discharge status: Home / Self Care | Payer: Medicare Other | Source: Ambulatory Visit | Attending: General Surgery | Admitting: General Surgery

## 2015-12-05 ENCOUNTER — Ambulatory Visit (HOSPITAL_COMMUNITY): Payer: Medicare Other | Admitting: Emergency Medicine

## 2015-12-05 ENCOUNTER — Ambulatory Visit (HOSPITAL_COMMUNITY): Payer: Medicare Other | Admitting: Certified Registered"

## 2015-12-05 DIAGNOSIS — Z9221 Personal history of antineoplastic chemotherapy: Secondary | ICD-10-CM | POA: Insufficient documentation

## 2015-12-05 DIAGNOSIS — F329 Major depressive disorder, single episode, unspecified: Secondary | ICD-10-CM | POA: Diagnosis not present

## 2015-12-05 DIAGNOSIS — C50811 Malignant neoplasm of overlapping sites of right female breast: Secondary | ICD-10-CM

## 2015-12-05 DIAGNOSIS — E079 Disorder of thyroid, unspecified: Secondary | ICD-10-CM | POA: Diagnosis not present

## 2015-12-05 DIAGNOSIS — C773 Secondary and unspecified malignant neoplasm of axilla and upper limb lymph nodes: Secondary | ICD-10-CM | POA: Insufficient documentation

## 2015-12-05 DIAGNOSIS — I10 Essential (primary) hypertension: Secondary | ICD-10-CM | POA: Diagnosis not present

## 2015-12-05 DIAGNOSIS — D0511 Intraductal carcinoma in situ of right breast: Secondary | ICD-10-CM | POA: Diagnosis not present

## 2015-12-05 DIAGNOSIS — K219 Gastro-esophageal reflux disease without esophagitis: Secondary | ICD-10-CM | POA: Diagnosis not present

## 2015-12-05 DIAGNOSIS — N6489 Other specified disorders of breast: Secondary | ICD-10-CM | POA: Diagnosis not present

## 2015-12-05 DIAGNOSIS — Z79899 Other long term (current) drug therapy: Secondary | ICD-10-CM | POA: Insufficient documentation

## 2015-12-05 DIAGNOSIS — J45909 Unspecified asthma, uncomplicated: Secondary | ICD-10-CM | POA: Insufficient documentation

## 2015-12-05 DIAGNOSIS — Z803 Family history of malignant neoplasm of breast: Secondary | ICD-10-CM | POA: Diagnosis not present

## 2015-12-05 DIAGNOSIS — Z87891 Personal history of nicotine dependence: Secondary | ICD-10-CM | POA: Diagnosis not present

## 2015-12-05 DIAGNOSIS — F419 Anxiety disorder, unspecified: Secondary | ICD-10-CM | POA: Diagnosis not present

## 2015-12-05 DIAGNOSIS — Z170 Estrogen receptor positive status [ER+]: Secondary | ICD-10-CM | POA: Diagnosis not present

## 2015-12-05 DIAGNOSIS — Z452 Encounter for adjustment and management of vascular access device: Secondary | ICD-10-CM | POA: Insufficient documentation

## 2015-12-05 DIAGNOSIS — C50911 Malignant neoplasm of unspecified site of right female breast: Secondary | ICD-10-CM | POA: Diagnosis present

## 2015-12-05 DIAGNOSIS — M797 Fibromyalgia: Secondary | ICD-10-CM | POA: Insufficient documentation

## 2015-12-05 DIAGNOSIS — C50411 Malignant neoplasm of upper-outer quadrant of right female breast: Secondary | ICD-10-CM | POA: Diagnosis present

## 2015-12-05 HISTORY — PX: BREAST LUMPECTOMY WITH NEEDLE LOCALIZATION AND AXILLARY LYMPH NODE DISSECTION: SHX5758

## 2015-12-05 HISTORY — PX: PORT-A-CATH REMOVAL: SHX5289

## 2015-12-05 HISTORY — PX: BREAST LUMPECTOMY WITH RADIOACTIVE SEED LOCALIZATION: SHX6424

## 2015-12-05 LAB — CBC
HEMATOCRIT: 35.6 % — AB (ref 36.0–46.0)
Hemoglobin: 11.5 g/dL — ABNORMAL LOW (ref 12.0–15.0)
MCH: 32.2 pg (ref 26.0–34.0)
MCHC: 32.3 g/dL (ref 30.0–36.0)
MCV: 99.7 fL (ref 78.0–100.0)
Platelets: 263 10*3/uL (ref 150–400)
RBC: 3.57 MIL/uL — ABNORMAL LOW (ref 3.87–5.11)
RDW: 18.1 % — AB (ref 11.5–15.5)
WBC: 17 10*3/uL — AB (ref 4.0–10.5)

## 2015-12-05 LAB — CREATININE, SERUM
Creatinine, Ser: 0.71 mg/dL (ref 0.44–1.00)
GFR calc non Af Amer: >60 mL/min (ref >60)

## 2015-12-05 SURGERY — BREAST LUMPECTOMY WITH RADIOACTIVE SEED LOCALIZATION
Anesthesia: General | Site: Chest | Laterality: Right

## 2015-12-05 MED ORDER — HYDROXYZINE HCL 25 MG PO TABS: 25.0000 mg | ORAL_TABLET | Freq: Three times a day (TID) | ORAL | Status: DC | PRN

## 2015-12-05 MED ORDER — ONDANSETRON HCL 4 MG/2ML IJ SOLN
INTRAMUSCULAR | Status: DC | PRN
  Administered 2015-12-05 (×2): 4 mg via INTRAVENOUS

## 2015-12-05 MED ORDER — FENTANYL CITRATE (PF) 100 MCG/2ML IJ SOLN
INTRAMUSCULAR | Status: DC | PRN
  Administered 2015-12-05: 25 ug via INTRAVENOUS
  Administered 2015-12-05 (×3): 50 ug via INTRAVENOUS
  Administered 2015-12-05: 25 ug via INTRAVENOUS
  Administered 2015-12-05: 50 ug via INTRAVENOUS

## 2015-12-05 MED ORDER — ONDANSETRON HCL 4 MG/2ML IJ SOLN
INTRAMUSCULAR | Status: AC
Start: 2015-12-05 — End: 2015-12-05
  Filled 2015-12-05: qty 2

## 2015-12-05 MED ORDER — PROPOFOL 10 MG/ML IV BOLUS
INTRAVENOUS | Status: DC | PRN
  Administered 2015-12-05: 150 mg via INTRAVENOUS

## 2015-12-05 MED ORDER — ONDANSETRON HCL 4 MG/2ML IJ SOLN
4.0000 mg | Freq: Four times a day (QID) | INTRAMUSCULAR | Status: DC | PRN
  Administered 2015-12-05: 4 mg via INTRAVENOUS
  Filled 2015-12-05: qty 2

## 2015-12-05 MED ORDER — ALBUTEROL SULFATE (2.5 MG/3ML) 0.083% IN NEBU: 2.5000 mg | INHALATION_SOLUTION | Freq: Four times a day (QID) | RESPIRATORY_TRACT | Status: DC | PRN

## 2015-12-05 MED ORDER — LACTATED RINGERS IV SOLN
INTRAVENOUS | Status: DC
  Administered 2015-12-05 (×2): via INTRAVENOUS

## 2015-12-05 MED ORDER — HYDROMORPHONE HCL 1 MG/ML IJ SOLN
1.0000 mg | INTRAMUSCULAR | Status: DC | PRN
  Administered 2015-12-05 – 2015-12-06 (×3): 1 mg via INTRAVENOUS
  Filled 2015-12-05 (×3): qty 1

## 2015-12-05 MED ORDER — ZOLPIDEM TARTRATE 5 MG PO TABS
5.0000 mg | ORAL_TABLET | Freq: Every evening | ORAL | Status: DC | PRN
  Administered 2015-12-06: 5 mg via ORAL
  Filled 2015-12-05: qty 1

## 2015-12-05 MED ORDER — MIDAZOLAM HCL 2 MG/2ML IJ SOLN
2.0000 mg | Freq: Once | INTRAMUSCULAR | Status: AC
  Administered 2015-12-05: 2 mg via INTRAVENOUS

## 2015-12-05 MED ORDER — PHENYLEPHRINE 40 MCG/ML (10ML) SYRINGE FOR IV PUSH (FOR BLOOD PRESSURE SUPPORT)
PREFILLED_SYRINGE | INTRAVENOUS | Status: AC
  Filled 2015-12-05: qty 20

## 2015-12-05 MED ORDER — MIDAZOLAM HCL 2 MG/2ML IJ SOLN
INTRAMUSCULAR | Status: AC
  Administered 2015-12-05: 2 mg via INTRAVENOUS
  Filled 2015-12-05: qty 2

## 2015-12-05 MED ORDER — PROCHLORPERAZINE MALEATE 10 MG PO TABS
10.0000 mg | ORAL_TABLET | Freq: Four times a day (QID) | ORAL | Status: DC | PRN
  Filled 2015-12-05: qty 1

## 2015-12-05 MED ORDER — ONDANSETRON HCL 4 MG/2ML IJ SOLN: 4.0000 mg | Freq: Once | INTRAMUSCULAR | Status: DC | PRN

## 2015-12-05 MED ORDER — LEVOTHYROXINE SODIUM 25 MCG PO TABS
137.0000 ug | ORAL_TABLET | Freq: Every day | ORAL | Status: DC
  Administered 2015-12-06: 137 ug via ORAL
  Filled 2015-12-05: qty 1

## 2015-12-05 MED ORDER — DEXAMETHASONE SODIUM PHOSPHATE 10 MG/ML IJ SOLN
INTRAMUSCULAR | Status: AC
  Filled 2015-12-05: qty 1

## 2015-12-05 MED ORDER — FENTANYL CITRATE (PF) 100 MCG/2ML IJ SOLN
100.0000 ug | Freq: Once | INTRAMUSCULAR | Status: AC
  Administered 2015-12-05: 100 ug via INTRAVENOUS

## 2015-12-05 MED ORDER — TRIAMTERENE-HCTZ 37.5-25 MG PO CAPS
1.0000 | ORAL_CAPSULE | Freq: Every day | ORAL | Status: DC
  Administered 2015-12-06: 1 via ORAL
  Filled 2015-12-05 (×3): qty 1

## 2015-12-05 MED ORDER — VALACYCLOVIR HCL 500 MG PO TABS
500.0000 mg | ORAL_TABLET | Freq: Two times a day (BID) | ORAL | Status: DC
  Filled 2015-12-05 (×2): qty 1

## 2015-12-05 MED ORDER — FENTANYL CITRATE (PF) 250 MCG/5ML IJ SOLN
INTRAMUSCULAR | Status: AC
  Filled 2015-12-05: qty 5

## 2015-12-05 MED ORDER — BUPROPION HCL ER (XL) 150 MG PO TB24
300.0000 mg | ORAL_TABLET | Freq: Every day | ORAL | Status: DC
  Administered 2015-12-06: 300 mg via ORAL
  Filled 2015-12-05: qty 2

## 2015-12-05 MED ORDER — LORAZEPAM 0.5 MG PO TABS: 0.5000 mg | ORAL_TABLET | Freq: Every evening | ORAL | Status: DC | PRN

## 2015-12-05 MED ORDER — OXYCODONE HCL 5 MG PO TABS
5.0000 mg | ORAL_TABLET | ORAL | Status: DC | PRN
  Administered 2015-12-05: 5 mg via ORAL
  Administered 2015-12-06 (×2): 10 mg via ORAL
  Filled 2015-12-05 (×3): qty 2

## 2015-12-05 MED ORDER — CYCLOSPORINE 0.05 % OP EMUL
1.0000 [drp] | Freq: Two times a day (BID) | OPHTHALMIC | Status: DC
  Filled 2015-12-05 (×2): qty 1

## 2015-12-05 MED ORDER — ENOXAPARIN SODIUM 40 MG/0.4ML ~~LOC~~ SOLN
40.0000 mg | SUBCUTANEOUS | Status: DC
  Administered 2015-12-06: 40 mg via SUBCUTANEOUS
  Filled 2015-12-05: qty 0.4

## 2015-12-05 MED ORDER — ONDANSETRON 4 MG PO TBDP: 4.0000 mg | ORAL_TABLET | Freq: Four times a day (QID) | ORAL | Status: DC | PRN

## 2015-12-05 MED ORDER — PHENYLEPHRINE HCL 10 MG/ML IJ SOLN
INTRAMUSCULAR | Status: DC | PRN
  Administered 2015-12-05 (×3): 80 ug via INTRAVENOUS
  Administered 2015-12-05: 120 ug via INTRAVENOUS
  Administered 2015-12-05: 40 ug via INTRAVENOUS

## 2015-12-05 MED ORDER — HYDROMORPHONE HCL 1 MG/ML IJ SOLN
INTRAMUSCULAR | Status: AC
  Administered 2015-12-05: 0.5 mg via INTRAVENOUS
  Filled 2015-12-05: qty 1

## 2015-12-05 MED ORDER — PANTOPRAZOLE SODIUM 40 MG PO TBEC: 40.0000 mg | DELAYED_RELEASE_TABLET | Freq: Every day | ORAL | Status: DC

## 2015-12-05 MED ORDER — MEPERIDINE HCL 25 MG/ML IJ SOLN: 6.2500 mg | INTRAMUSCULAR | Status: DC | PRN

## 2015-12-05 MED ORDER — ADULT MULTIVITAMIN W/MINERALS CH
1.0000 | ORAL_TABLET | Freq: Every day | ORAL | Status: DC
  Administered 2015-12-05 – 2015-12-06 (×2): 1 via ORAL
  Filled 2015-12-05 (×2): qty 1

## 2015-12-05 MED ORDER — METHOCARBAMOL 500 MG PO TABS
500.0000 mg | ORAL_TABLET | Freq: Four times a day (QID) | ORAL | Status: DC | PRN
  Administered 2015-12-06: 500 mg via ORAL
  Filled 2015-12-05: qty 1

## 2015-12-05 MED ORDER — PANTOPRAZOLE SODIUM 40 MG PO TBEC
40.0000 mg | DELAYED_RELEASE_TABLET | Freq: Every day | ORAL | Status: DC
  Administered 2015-12-06: 40 mg via ORAL
  Filled 2015-12-05: qty 1

## 2015-12-05 MED ORDER — CLOTRIMAZOLE 10 MG MT TROC: 10.0000 mg | Freq: Every day | OROMUCOSAL | Status: DC | PRN

## 2015-12-05 MED ORDER — MIDAZOLAM HCL 2 MG/2ML IJ SOLN
INTRAMUSCULAR | Status: AC
  Administered 2015-12-05: 2 mg
  Filled 2015-12-05: qty 2

## 2015-12-05 MED ORDER — HYDROMORPHONE HCL 1 MG/ML IJ SOLN
INTRAMUSCULAR | Status: AC
  Filled 2015-12-05: qty 1

## 2015-12-05 MED ORDER — BUPIVACAINE-EPINEPHRINE 0.25% -1:200000 IJ SOLN
INTRAMUSCULAR | Status: DC | PRN
  Administered 2015-12-05: 20 mL

## 2015-12-05 MED ORDER — HYDROMORPHONE HCL 1 MG/ML IJ SOLN
0.2500 mg | INTRAMUSCULAR | Status: DC | PRN
  Administered 2015-12-05 (×4): 0.5 mg via INTRAVENOUS

## 2015-12-05 MED ORDER — LACTATED RINGERS IV SOLN: INTRAVENOUS | Status: DC

## 2015-12-05 MED ORDER — MIDAZOLAM HCL 2 MG/2ML IJ SOLN: 0.5000 mg | Freq: Once | INTRAMUSCULAR | Status: DC

## 2015-12-05 MED ORDER — PROPOFOL 10 MG/ML IV BOLUS
INTRAVENOUS | Status: AC
  Filled 2015-12-05: qty 20

## 2015-12-05 MED ORDER — LIDOCAINE HCL (CARDIAC) 20 MG/ML IV SOLN
INTRAVENOUS | Status: DC | PRN
  Administered 2015-12-05: 40 mg via INTRAVENOUS

## 2015-12-05 MED ORDER — OXYCODONE HCL 5 MG PO TABS
ORAL_TABLET | ORAL | Status: AC
  Filled 2015-12-05: qty 1

## 2015-12-05 MED ORDER — FENTANYL CITRATE (PF) 100 MCG/2ML IJ SOLN
INTRAMUSCULAR | Status: AC
  Administered 2015-12-05: 100 ug via INTRAVENOUS
  Filled 2015-12-05: qty 2

## 2015-12-05 MED ORDER — DEXAMETHASONE SODIUM PHOSPHATE 10 MG/ML IJ SOLN
INTRAMUSCULAR | Status: DC | PRN
  Administered 2015-12-05: 5 mg via INTRAVENOUS

## 2015-12-05 MED ORDER — POVIDONE-IODINE 10 % EX OINT
TOPICAL_OINTMENT | CUTANEOUS | Status: DC | PRN
  Administered 2015-12-05: 1 via TOPICAL

## 2015-12-05 MED ORDER — NABUMETONE 500 MG PO TABS
750.0000 mg | ORAL_TABLET | Freq: Two times a day (BID) | ORAL | Status: DC | PRN
  Filled 2015-12-05: qty 2

## 2015-12-05 MED ORDER — DIAZEPAM 5 MG PO TABS
5.0000 mg | ORAL_TABLET | Freq: Four times a day (QID) | ORAL | Status: DC | PRN
Start: 2015-12-05 — End: 2015-12-06

## 2015-12-05 MED ORDER — GABAPENTIN 300 MG PO CAPS
300.0000 mg | ORAL_CAPSULE | ORAL | Status: AC
  Administered 2015-12-05: 300 mg via ORAL
  Filled 2015-12-05: qty 1

## 2015-12-05 MED ORDER — ONDANSETRON HCL 4 MG/2ML IJ SOLN
INTRAMUSCULAR | Status: AC
  Filled 2015-12-05: qty 2

## 2015-12-05 MED ORDER — 0.9 % SODIUM CHLORIDE (POUR BTL) OPTIME
TOPICAL | Status: DC | PRN
  Administered 2015-12-05 (×2): 1000 mL

## 2015-12-05 MED ORDER — POLYVINYL ALCOHOL 1.4 % OP SOLN: OPHTHALMIC | Status: DC | PRN

## 2015-12-05 MED ORDER — CALCIUM CARBONATE-VITAMIN D 500-200 MG-UNIT PO TABS
1.0000 | ORAL_TABLET | Freq: Every day | ORAL | Status: DC
  Administered 2015-12-06: 1 via ORAL
  Filled 2015-12-05: qty 1

## 2015-12-05 MED ORDER — ACETAMINOPHEN 500 MG PO TABS
1000.0000 mg | ORAL_TABLET | ORAL | Status: AC
  Administered 2015-12-05: 1000 mg via ORAL
  Filled 2015-12-05 (×2): qty 2

## 2015-12-05 MED ORDER — MIDAZOLAM HCL 2 MG/2ML IJ SOLN
INTRAMUSCULAR | Status: AC
  Filled 2015-12-05: qty 2

## 2015-12-05 MED ORDER — CALCITONIN (SALMON) 200 UNIT/ACT NA SOLN
1.0000 | Freq: Every day | NASAL | Status: DC
  Filled 2015-12-05: qty 3.7

## 2015-12-05 MED ORDER — POVIDONE-IODINE 10 % EX OINT
TOPICAL_OINTMENT | CUTANEOUS | Status: AC
  Filled 2015-12-05: qty 28.35

## 2015-12-05 SURGICAL SUPPLY — 65 items
ADH SKN CLS APL DERMABOND .7 (GAUZE/BANDAGES/DRESSINGS) ×3
APPLIER CLIP 9.375 MED OPEN (MISCELLANEOUS) ×10
APR CLP MED 9.3 20 MLT OPN (MISCELLANEOUS) ×6
BINDER BREAST LRG (GAUZE/BANDAGES/DRESSINGS) ×3 IMPLANT
BINDER BREAST XLRG (GAUZE/BANDAGES/DRESSINGS) IMPLANT
BLADE SURG 15 STRL LF DISP TIS (BLADE) ×3 IMPLANT
BLADE SURG 15 STRL SS (BLADE) ×5
BLADE SURG ROTATE 9660 (MISCELLANEOUS) IMPLANT
CANISTER SUCTION 2500CC (MISCELLANEOUS) ×5 IMPLANT
CHLORAPREP W/TINT 10.5 ML (MISCELLANEOUS) ×3 IMPLANT
CHLORAPREP W/TINT 26ML (MISCELLANEOUS) ×2 IMPLANT
CLIP APPLIE 9.375 MED OPEN (MISCELLANEOUS) ×4 IMPLANT
COVER PROBE W GEL 5X96 (DRAPES) ×5 IMPLANT
COVER SURGICAL LIGHT HANDLE (MISCELLANEOUS) ×5 IMPLANT
DERMABOND ADVANCED (GAUZE/BANDAGES/DRESSINGS) ×2
DERMABOND ADVANCED .7 DNX12 (GAUZE/BANDAGES/DRESSINGS) ×5 IMPLANT
DEVICE DUBIN SPECIMEN MAMMOGRA (MISCELLANEOUS) ×8 IMPLANT
DRAIN CHANNEL 19F RND (DRAIN) ×5 IMPLANT
DRAPE CHEST BREAST 15X10 FENES (DRAPES) ×1 IMPLANT
DRAPE LAPAROSCOPIC ABDOMINAL (DRAPES) ×5 IMPLANT
DRAPE LAPAROTOMY T 98X78 PEDS (DRAPES) ×2 IMPLANT
DRAPE PROXIMA HALF (DRAPES) ×2 IMPLANT
DRAPE UTILITY XL STRL (DRAPES) ×4 IMPLANT
DRSG PAD ABDOMINAL 8X10 ST (GAUZE/BANDAGES/DRESSINGS) ×11 IMPLANT
ELECT CAUTERY BLADE 6.4 (BLADE) ×5 IMPLANT
ELECT REM PT RETURN 9FT ADLT (ELECTROSURGICAL) ×5
ELECTRODE REM PT RTRN 9FT ADLT (ELECTROSURGICAL) ×3 IMPLANT
EVACUATOR SILICONE 100CC (DRAIN) ×5 IMPLANT
GAUZE SPONGE 4X4 12PLY STRL (GAUZE/BANDAGES/DRESSINGS) ×5 IMPLANT
GAUZE SPONGE 4X4 16PLY XRAY LF (GAUZE/BANDAGES/DRESSINGS) ×3 IMPLANT
GLOVE EUDERMIC 7 POWDERFREE (GLOVE) ×9 IMPLANT
GOWN STRL REUS W/ TWL LRG LVL3 (GOWN DISPOSABLE) ×3 IMPLANT
GOWN STRL REUS W/ TWL XL LVL3 (GOWN DISPOSABLE) ×3 IMPLANT
GOWN STRL REUS W/TWL LRG LVL3 (GOWN DISPOSABLE) ×5
GOWN STRL REUS W/TWL XL LVL3 (GOWN DISPOSABLE) ×5
ILLUMINATOR WAVEGUIDE N/F (MISCELLANEOUS) IMPLANT
KIT BASIN OR (CUSTOM PROCEDURE TRAY) ×5 IMPLANT
KIT MARKER MARGIN INK (KITS) ×8 IMPLANT
KIT ROOM TURNOVER OR (KITS) ×5 IMPLANT
LIGHT WAVEGUIDE WIDE FLAT (MISCELLANEOUS) IMPLANT
NDL HYPO 25GX1X1/2 BEV (NEEDLE) ×3 IMPLANT
NDL HYPO 25X1 1.5 SAFETY (NEEDLE) ×3 IMPLANT
NEEDLE HYPO 25GX1X1/2 BEV (NEEDLE) ×10 IMPLANT
NEEDLE HYPO 25X1 1.5 SAFETY (NEEDLE) IMPLANT
NS IRRIG 1000ML POUR BTL (IV SOLUTION) ×8 IMPLANT
PACK GENERAL/GYN (CUSTOM PROCEDURE TRAY) ×5 IMPLANT
PACK SURGICAL SETUP 50X90 (CUSTOM PROCEDURE TRAY) ×5 IMPLANT
PAD ARMBOARD 7.5X6 YLW CONV (MISCELLANEOUS) ×5 IMPLANT
PENCIL BUTTON HOLSTER BLD 10FT (ELECTRODE) ×3 IMPLANT
SPECIMEN JAR MEDIUM (MISCELLANEOUS) IMPLANT
SPONGE GAUZE 4X4 12PLY STER LF (GAUZE/BANDAGES/DRESSINGS) ×3 IMPLANT
SPONGE LAP 18X18 X RAY DECT (DISPOSABLE) ×5 IMPLANT
SPONGE LAP 4X18 X RAY DECT (DISPOSABLE) ×5 IMPLANT
SUT ETHILON 3 0 FSL (SUTURE) ×5 IMPLANT
SUT MNCRL AB 4-0 PS2 18 (SUTURE) ×14 IMPLANT
SUT SILK 2 0 SH (SUTURE) ×5 IMPLANT
SUT VIC AB 3-0 SH 18 (SUTURE) ×13 IMPLANT
SUT VICRYL AB 3 0 TIES (SUTURE) IMPLANT
SYR BULB 3OZ (MISCELLANEOUS) ×1 IMPLANT
SYR CONTROL 10ML LL (SYRINGE) ×5 IMPLANT
TOWEL OR 17X24 6PK STRL BLUE (TOWEL DISPOSABLE) ×5 IMPLANT
TOWEL OR 17X26 10 PK STRL BLUE (TOWEL DISPOSABLE) ×5 IMPLANT
TUBE CONNECTING 12'X1/4 (SUCTIONS)
TUBE CONNECTING 12X1/4 (SUCTIONS) ×1 IMPLANT
YANKAUER SUCT BULB TIP NO VENT (SUCTIONS) ×2 IMPLANT

## 2015-12-05 NOTE — Anesthesia Preprocedure Evaluation (Signed)
Anesthesia Evaluation  Patient identified by MRN, date of birth, ID band Patient awake    History of Anesthesia Complications (+) PONV  Airway Mallampati: I  TM Distance: >3 FB Neck ROM: Full    Dental   Pulmonary asthma , former smoker,    Pulmonary exam normal        Cardiovascular Normal cardiovascular exam     Neuro/Psych Depression    GI/Hepatic GERD  Medicated and Controlled,  Endo/Other    Renal/GU      Musculoskeletal   Abdominal   Peds  Hematology   Anesthesia Other Findings   Reproductive/Obstetrics                             Anesthesia Physical Anesthesia Plan  ASA: II  Anesthesia Plan: General   Post-op Pain Management:  Regional for Post-op pain   Induction: Intravenous  Airway Management Planned: LMA  Additional Equipment:   Intra-op Plan:   Post-operative Plan: Extubation in OR  Informed Consent: I have reviewed the patients History and Physical, chart, labs and discussed the procedure including the risks, benefits and alternatives for the proposed anesthesia with the patient or authorized representative who has indicated his/her understanding and acceptance.     Plan Discussed with: CRNA and Surgeon  Anesthesia Plan Comments:         Anesthesia Quick Evaluation

## 2015-12-05 NOTE — Anesthesia Procedure Notes (Signed)
Anesthesia Procedure Image    

## 2015-12-05 NOTE — Progress Notes (Signed)
Pt asking for versed for her nerves, Dr Conrad [place] called and new orders noted.

## 2015-12-05 NOTE — Anesthesia Procedure Notes (Signed)
Anesthesia Regional Block:  Pectoralis block  Pre-Anesthetic Checklist: ,, timeout performed, Correct Patient, Correct Site, Correct Laterality, Correct Procedure, Correct Position, site marked, Risks and benefits discussed,  Surgical consent,  Pre-op evaluation,  At surgeon's request and post-op pain management  Laterality: Right  Prep: chloraprep       Needles:  Injection technique: Single-shot     Needle Length: 9cm 9 cm Needle Gauge: 21 and 21 G    Additional Needles:  Procedures: ultrasound guided (picture in chart) Pectoralis block Narrative:  Start time: 12/05/2015 12:30 PM End time: 12/05/2015 12:40 PM Injection made incrementally with aspirations every 5 mL.  Performed by: Personally  Anesthesiologist: Lillia Abed  Additional Notes: Monitors applied. Patient sedated. Sterile prep and drape,hand hygiene and sterile gloves were used. Relevant anatomy identified.Needle position confirmed.Local anesthetic injected incrementally after negative aspiration. Local anesthetic spread visualized. Vascular puncture avoided. No complications. Image printed for medical record.The patient tolerated the procedure well.

## 2015-12-05 NOTE — Transfer of Care (Signed)
Immediate Anesthesia Transfer of Care Note  Patient: [name]  Procedure(s) Performed: Procedure(s): RADIOACTIVE SEED GUIDED LEFT BREAST LUMPECTOMY (Left) RIGHT BREAST NEEDLE LOCALIZED (WIRES X 2) LUMPECTOMY WITH AXILLARY LYMPH NODE DISSECTION (Right) REMOVAL PORT-A-CATH (Right)  Patient Location: PACU  Anesthesia Type:General  Level of Consciousness: awake and alert   Airway & Oxygen Therapy: Patient Spontanous Breathing  Post-op Assessment: Report given to RN and Post -op Vital signs reviewed and stable  Post vital signs: Reviewed and stable  Last Vitals:  Vitals:   12/05/15 1314 12/05/15 1318  BP: 138/61 128/64  Pulse: 69 70  Resp: 13 19  Temp:      Last Pain:  Vitals:   12/05/15 1230  TempSrc:   PainSc: 7          Complications: No apparent anesthesia complications

## 2015-12-05 NOTE — Anesthesia Postprocedure Evaluation (Signed)
Anesthesia Post Note  Patient: [name]  Procedure(s) Performed: Procedure(s) (LRB): RADIOACTIVE SEED GUIDED LEFT BREAST LUMPECTOMY (Left) RIGHT BREAST NEEDLE LOCALIZED (WIRES X 2) LUMPECTOMY WITH AXILLARY LYMPH NODE DISSECTION (Right) REMOVAL PORT-A-CATH (Right)  Patient location during evaluation: PACU Anesthesia Type: General Level of consciousness: awake and alert Pain management: pain level controlled Vital Signs Assessment: post-procedure vital signs reviewed and stable Respiratory status: spontaneous breathing, nonlabored ventilation and respiratory function stable Cardiovascular status: blood pressure returned to baseline and stable Postop Assessment: no signs of nausea or vomiting Anesthetic complications: no    Last Vitals:  Vitals:   12/05/15 1800 12/05/15 1837  BP:  (!) 122/56  Pulse: 66 69  Resp: 13 13  Temp:  36.3 C    Last Pain:  Vitals:   12/05/15 1837  TempSrc: Oral  PainSc: 8                  Dayvon Dax A

## 2015-12-05 NOTE — Anesthesia Procedure Notes (Signed)
Procedure Name: LMA Insertion Date/Time: 12/05/2015 1:42 PM Performed by: Freddie Breech Pre-anesthesia Checklist: Patient identified, Emergency Drugs available, Suction available and Patient being monitored Patient Re-evaluated:Patient Re-evaluated prior to inductionOxygen Delivery Method: Circle System Utilized Preoxygenation: Pre-oxygenation with 100% oxygen Intubation Type: IV induction LMA: LMA inserted LMA Size: 3.0 Number of attempts: 1 Airway Equipment and Method: Bite block Placement Confirmation: positive ETCO2 and breath sounds checked- equal and bilateral Tube secured with: Tape Dental Injury: Teeth and Oropharynx as per pre-operative assessment

## 2015-12-05 NOTE — Op Note (Signed)
Patient Name:           [name]   Date of Surgery:        12/05/2015  Pre op Diagnosis:     Multifocal cancer right breast                                     Right axillary lymph node metastasis                                     Status post recent neoadjuvant chemotherapy                                      Complexly sclerosing lesion left breast   Post op Diagnosis:    same  Procedure:                 Removal of Port-A-Cath                                       Left breast lumpectomy with radioactive seed localization                                       Right breast lumpectomy with double wire needle localization                                       Complete right axillary lymph node dissection   Surgeon:                     Edsel Petrin. Dalbert Batman, M.D., FACS  Assistant:                      OR staff   Operative Indications:   . This is a very pleasant 70 year old Caucasian female who returns following neoadjuvant chemotherapy for decision-making report regarding definitive surgery of her right breast and left breast. Dr. Jana Hakim is her oncologist. Dr. Alona Bene is her urologist. Dr. Jori Moll polite is her PCP.     Initially evaluated in April of this year. She was found to have 2 adjacent cancers in the right breast and bulky right axillary adenopathy as well as a complex sclerosing lesion of the left breast. Mammogram showed that the index cancer was 1.6 cm but the MRI said it was 2.3 cm. 12 o'clock position 3 cm from the nipple. Multiple bulky abnormal right axillary lymph nodes. MRI showed a satellite lesion 5 mm away from index right breast cancer. Same quadrant. Both of these were biopsied. Axilla was biopsied. All 3 areas were positive for invasive carcinoma, strongly estrogen receptor positive. HER-2/neu negative. She also had a left breast biopsy which showed a complex coursing lesion and we agreed that we would remove that after chemotherapy. She has done  well . Dr. Jana Hakim says he is through with chemotherapy and we may remove the port.      End of treatment MRI on November 16, 2015 shows that the cancer  in the right breast has gone from 2.3 to 0.8 cm. Secondly there is a complete MRI response of the immediately adjacent satellite cancer. Third all of the right axillary lymph nodes are much smaller in size. Interestingly, the complex coursing lesion the left breast is smaller and is now only 3 mm.      Genetics have been recommended but she has not gotten that test yet but says she will later.       We talked about her surgical options. She knows will remove the Port-A-Cath. She knows recommended do a lumpectomy on the left side with radioactive seed. She knows that she needs a complete right axillary lymph node dissection because of the bulky adenopathy and we talked about the side effects of that. We talked about the options of lumpectomy or mastectomy on the right, breast, both of which are national guidelines. To do a lumpectomy would we would have to place 2 wires the morning of surgery and there may be some volume loss which may be noticeable or may be minor. She doesn't really care about the cosmetic defect . We talked about mastectomy. I told her that that might slightly reduce the recurrence risk but there was no survival advantage. At the end we decided to go ahead with lumpectomy on the right.     She is scheduled for Port-A-Cath removal, left breast lumpectomy with radioactive seed localization, right breast lumpectomy with double wire localization, right axillary lymph node dissection. She knows she'll have a drain and she'll have to spend the night in the hospital. I discussed the indications, details, techniques, and numerous risk of the surgery with the patient and her husband.   Operative Findings:       A right pectoral block was placed by the anesthesiologist preop.  The port was removed uneventfully.  The specimen mammogram  of the left breast looks good with the biopsy marker and the radioactive seed in the center of the specimen.  The right breast lumpectomy went well.  Lots of scar tissue and some hematoma but we went widely around the 2 wires in the specimen mammogram looked very good with the wires and the marker clips seemingly centered in the specimen.  A complete right axillary lymph node dissection was performed and there were some small firm lymph nodes present.  Nothing invasive to the chest wall however   Procedure in Detail:          Following the induction of general LMA anesthesia, surgical timeout was performed, intravenous antibiotic given.  The neck and both breast and  chest walls prepped and draped in a sterile fashion.  0.5% Marcaine with epinephrine was used as local infiltration anesthetic.     A transverse incision was made in the left infraclavicular area through the old scar where the port was placed.  Dissection was carried down to the port.  The port was elevated.  All 3 Prolene sutures were cut and removed.  The port and catheter were removed easily and they were intact.  There was no bleeding.  The subcutaneous tissue was closed with 3-0 Vicryl sutures and the skin closed with a running subcuticular 4-0 Monocryl and Dermabond.      The left breast lumpectomy with radioactive seed localization was performed next.  The radioactive signal was maximal at the areolar margin inferolaterally.  Incision was made at the areolar margin to perform a hidden scar technique.  Dissection was carried down into the breast tissue, and using  the neoprobe, around the radioactive signal.  The specimen was removed and marked with silk sutures and a 6 color ink kit to orient the pathologist.  Specimen mammogram looked very good.  The specimen was marked and sent to the lab.  Hemostasis was excellent.  The breast tissues were reconstructed with multiple interrupted 3-0 Vicryl sutures and the skin was closed with a running  subcuticular 4-0 Monocryl and Dermabond.      Next, I performed a right breast lumpectomy with double wire needle localization.  Following review of the biplane mammograms I designed a transverse curvilinear incision just above the nipple and areola.  The incision was made.  I dissected down into the breast tissues widely.  Lots of scar tissue and some hematoma from the 2 previous biopsies.  The specimen was removed and marked with silk sutures and a 6 color ink at to orient the pathologist.  The specimen mammogram actually looked very good.  Specimen was sent to the lab.  The wound was copiously irrigated with saline.  A spend a lot of time making sure we had excellent hemostasis hemostasis.  The lumpectomy cavity was marked with 5 metal clips and the breast tissues reapproximated with multiple interrupted 3-0 Vicryl sutures and the skin closed with a running subcuticular 4-0 Monocryl and Dermabond.      Next, I performed a complete right axillary lymph node dissection removing level one and level II lymph nodes.  A transverse incision was made in the right axillary hairline.  Dissection was carried down through the subcutaneous tissue and the clavipectoral fascia was incised.  I freed up and mobilized the lateral borders of the pectoralis major and pectoralis minor muscles and mobilized these medially.  Small venous territories were controlled with metal clips and divided.  I continued the dissection upwards until identify the axillary vein.  I cleaned off the tissues from the inferior margin of the  right axillary vein.  One venous tributary of the axillary vein was controlled with multiple medical clips and divided.  I then took all of the L I and level II lymph nodes out from below the axillary vein preserving the thoracodorsal neurovascular bundle.  Some of these lymph nodes felt slightly enlarged and slightly firm.  This required a fair amount of dissection both sharply and bluntly but the specimen was  removed and sent to the lab.  Hemostasis was very good and achieved with metal clips and electrocautery.  The wound was irrigated with saline and observed carefully and appeared to be hemostatic.  A 19 [place] [name] drain was placed in the axillary space and brought out through a separate incision at the inframammary crease laterally.  This was sutured the skin with nylon suture and connected to suction bulb.  The clavipectoral fascia was closed with interrupted 3-0 Vicryls and the skin closed with a running  4-0 Monocryl and Dermabond.  Dry bandages and a breast binder were placed.  The patient tolerated the procedure well was taken to PACU in stable condition.  EBL 40 mL.  Counts correct.  Complications none.     Edsel Petrin. Dalbert Batman, M.D., FACS General and Minimally Invasive Surgery Breast and Colorectal Surgery  12/05/2015 4:07 PM

## 2015-12-05 NOTE — Interval H&P Note (Signed)
History and Physical Interval Note:  12/05/2015 11:08 AM  [name]  has presented today for surgery, with the diagnosis of RIGHT BREAST CANCER, LEFT ABNORMAL MAMMOGRAM  The various methods of treatment have been discussed with the patient and family. After consideration of risks, benefits and other options for treatment, the patient has consented to  Procedure(s): LEFT BREAST LUMPECTOMY WITH RADIOACTIVE SEED LOCALIZATION (Left) RIGHT BREAST LUMPECTOMY WITH NEEDLE LOCALIZATION AND AXILLARY LYMPH NODE DISSECTION (Right) REMOVAL PORT-A-CATH (N/A) as a surgical intervention .  The patient's history has been reviewed, patient examined, no change in status, stable for surgery.  I have reviewed the patient's chart and labs.  Questions were answered to the patient's satisfaction.     [name]

## 2015-12-05 NOTE — Progress Notes (Signed)
Dr Dalbert Batman called and asked which side IV should be placed. States to place on the left side if we are able.

## 2015-12-06 ENCOUNTER — Encounter (HOSPITAL_COMMUNITY): Payer: Self-pay | Admitting: General Surgery

## 2015-12-06 DIAGNOSIS — C50811 Malignant neoplasm of overlapping sites of right female breast: Secondary | ICD-10-CM | POA: Diagnosis not present

## 2015-12-06 LAB — BASIC METABOLIC PANEL
ANION GAP: 9 (ref 5–15)
BUN: 9 mg/dL (ref 6–20)
CALCIUM: 8.8 mg/dL — AB (ref 8.9–10.3)
CO2: 25 mmol/L (ref 22–32)
CREATININE: 0.63 mg/dL (ref 0.44–1.00)
Chloride: 101 mmol/L (ref 101–111)
Glucose, Bld: 119 mg/dL — ABNORMAL HIGH (ref 65–99)
Potassium: 3.5 mmol/L (ref 3.5–5.1)
Sodium: 135 mmol/L (ref 135–145)

## 2015-12-06 LAB — CBC
HCT: 32.4 % — ABNORMAL LOW (ref 36.0–46.0)
HEMOGLOBIN: 10.4 g/dL — AB (ref 12.0–15.0)
MCH: 32.5 pg (ref 26.0–34.0)
MCHC: 32.1 g/dL (ref 30.0–36.0)
MCV: 101.3 fL — ABNORMAL HIGH (ref 78.0–100.0)
PLATELETS: 256 10*3/uL (ref 150–400)
RBC: 3.2 MIL/uL — AB (ref 3.87–5.11)
RDW: 18.3 % — ABNORMAL HIGH (ref 11.5–15.5)
WBC: 15.4 10*3/uL — ABNORMAL HIGH (ref 4.0–10.5)

## 2015-12-06 MED ORDER — PNEUMOCOCCAL VAC POLYVALENT 25 MCG/0.5ML IJ INJ: 0.5000 mL | INJECTION | INTRAMUSCULAR | Status: DC

## 2015-12-06 MED ORDER — OXYCODONE HCL 5 MG PO TABS: 5.0000 mg | ORAL_TABLET | Freq: Four times a day (QID) | ORAL | 0 refills | Status: DC | PRN

## 2015-12-06 NOTE — Discharge Summary (Signed)
Patient ID: [name] [id number] 70 y.o. 1946-04-11  Admit date: 12/05/2015  Discharge date and time: 12/06/2015  Admitting Physician: Adin Hector  Discharge Physician: Adin Hector  Admission Diagnoses: RIGHT BREAST CANCER  Discharge Diagnoses: Multifocal cancer right breast                                         Right axillary lymph node metastasis                                         Complex sclerosing lesion left breast                                         Status post recent neoadjuvant chemotherapy  Operations: Procedure(s): RADIOACTIVE SEED GUIDED LEFT BREAST LUMPECTOMY RIGHT BREAST NEEDLE LOCALIZED (WIRES X 2) LUMPECTOMY WITH AXILLARY LYMPH NODE DISSECTION REMOVAL PORT-A-CATH  Admission Condition: good  Discharged Condition: good  Indication for Admission: This is a very pleasant 70 year old Caucasian female who returns following neoadjuvant chemotherapy for decision-making report regarding definitive surgery of her right breast and left breast. Dr. Jana Hakim is her oncologist. Dr. Alona Bene is her urologist. Dr. Jori Moll polite is her PCP.     Initially evaluated in April of this year. She was found to have 2 adjacent cancers in the right breast and bulky right axillary adenopathy as well as a complex sclerosing lesion of the left breast. Mammogram showed that the index cancer was 1.6 cm but the MRI said it was 2.3 cm. 12 o'clock position 3 cm from the nipple. Multiple bulky abnormal right axillary lymph nodes. MRI showed a satellite lesion 5 mm away from index right breast cancer. Same quadrant. Both of these were biopsied. Axilla was biopsied. All 3 areas were positive for invasive carcinoma, strongly estrogen receptor positive. HER-2/neu negative. She also had a left breast biopsy which showed a complex coursing lesion and we agreed that we would remove that after chemotherapy. She has done well . Dr. Jana Hakim says he is through with  chemotherapy and we may remove the port.      End of treatment MRI on November 16, 2015 shows that the cancer in the right breast has gone from 2.3 to 0.8 cm. Secondly there is a complete MRI response of the immediately adjacent satellite cancer. Third all of the right axillary lymph nodes are much smaller in size. Interestingly, the complex coursing lesion the left breast is smaller and is now only 3 mm.      Genetics have been recommended but she has not gotten that test yet but says she will later.       We talked about her surgical options. She knows will remove the Port-A-Cath. She knows recommended do a lumpectomy on the left side with radioactive seed. She knows that she needs a complete right axillary lymph node dissection because of the bulky adenopathy and we talked about the side effects of that. We talked about the options of lumpectomy or mastectomy on the right, breast, both of which are national guidelines. To do a lumpectomy would we would have to place 2 wires the morning of surgery and there may be some volume loss which may  be noticeable or may be minor. She doesn't really care about the cosmetic defect . We talked about mastectomy. I told her that that might slightly reduce the recurrence risk but there was no survival advantage. At the end we decided to go ahead with lumpectomy on the right.     She is scheduled for Port-A-Cath removal, left breast lumpectomy with radioactive seed localization, right breast lumpectomy with double wire localization, right axillary lymph node dissection. She knows she'll have a drain and she'll have to spend the night in the hospital. I discussed the indications, details, techniques, and numerous risk of the surgery with the patient and her husband.   Hospital Course: On the day of admission she was taken to the operating room.  The procedures performed were removal of Port-A-Cath, left breast lumpectomy with radioactive seed localization to  remove the complex grossly lesion.  Right breast lumpectomy with double wire needle localization and complete right axillary lymph node dissection.  We left a drain in the right axilla.  The surgery went well.      She was observed overnight.  Pain control is adequate.  She was sore.  She did not have any significant bleeding or hematoma.  On postop day 1 she was alert and felt ready to go home.  She was able to ambulate to the bathroom.  She was tolerating diet.  Both of her lumpectomy sites at her axillary wound looked good.  There were ecchymoses but no hematoma.  The drainage from the right axillary drain was serosanguineous and fairly thin.  No significant arm swelling or numbness.  I discussed operative events with her.  She is aware that we are hopeful but not certain that we got negative margins on the right.      She was advised the pathology report will come out by Thursday or Friday and that someone from our office she call that report to her.  She was advised that I will be out of town until next Monday.  She was advised to return to see me in the office in 7-10 days to check the drainage and she was instructed in drain and wound care.     She was given a prescription for OxyIR for pain.  She was encouraged to hydrated and walk a lot.  Wound and drain care was discussed.  She was advised to see her medical oncologist and radiation oncologist within 3-4 weeks.  Consults: None  Significant Diagnostic Studies: Surgical pathology, pending  Treatments: surgery: Removal of Port-A-Cath, left breast lumpectomy with radioactive seed localization, right breast lumpectomy with double wire localization, complete right axillary lymph node dissection  Disposition: Home  Patient Instructions:    Medication List    TAKE these medications   albuterol 108 (90 Base) MCG/ACT inhaler Commonly known as:  PROVENTIL HFA;VENTOLIN HFA Inhale 1-2 puffs into the lungs every 6 (six) hours as needed for wheezing  or shortness of breath.   buPROPion 300 MG 24 hr tablet Commonly known as:  WELLBUTRIN XL Take 300 mg by mouth daily.   calcitonin (salmon) 200 UNIT/ACT nasal spray Commonly known as:  MIACALCIN/FORTICAL Place 1 spray into alternate nostrils daily.   CALCIUM + D PO Take 1 tablet by mouth daily.   carisoprodol 350 MG tablet Commonly known as:  SOMA Take 350 mg by mouth 2 (two) times daily as needed for muscle spasms.   clotrimazole 10 MG troche Commonly known as:  MYCELEX Take 1 tablet (10 mg total) by  mouth 5 (five) times daily. What changed:  when to take this  reasons to take this   cycloSPORINE 0.05 % ophthalmic emulsion Commonly known as:  RESTASIS Place 1 drop into both eyes 2 (two) times daily. What changed:  when to take this   DYAZIDE 37.5-25 MG capsule Generic drug:  triamterene-hydrochlorothiazide Take 1 tablet by mouth daily.   fluconazole 100 MG tablet Commonly known as:  DIFLUCAN Take 1 tablet (100 mg total) by mouth daily. What changed:  when to take this  reasons to take this   HYDROcodone-acetaminophen 10-325 MG tablet Commonly known as:  NORCO Take 1 tablet by mouth every 4 (four) hours as needed for moderate pain or severe pain.   hydrOXYzine 25 MG tablet Commonly known as:  ATARAX/VISTARIL Take 1 tablet (25 mg total) by mouth 3 (three) times daily as needed for anxiety or itching.   lidocaine-prilocaine cream Commonly known as:  EMLA Apply to affected area once What changed:  how much to take  how to take this  when to take this  reasons to take this  additional instructions   LORazepam 0.5 MG tablet Commonly known as:  ATIVAN Take 1 tablet (0.5 mg total) by mouth at bedtime as needed (Nausea or vomiting).   multivitamin capsule Take 1 capsule by mouth daily.   nabumetone 750 MG tablet Commonly known as:  RELAFEN Take 750 mg by mouth 2 (two) times daily as needed for mild pain.   oxyCODONE 5 MG immediate release  tablet Commonly known as:  Oxy IR/ROXICODONE Take 1-2 tablets (5-10 mg total) by mouth every 6 (six) hours as needed for severe pain.   pantoprazole 40 MG tablet Commonly known as:  PROTONIX Take 40 mg by mouth daily.   prochlorperazine 10 MG tablet Commonly known as:  COMPAZINE Take 1 tablet (10 mg total) by mouth every 6 (six) hours as needed (Nausea or vomiting).   SYNTHROID 137 MCG tablet Generic drug:  levothyroxine Take 137 mcg by mouth daily.   SYSTANE OP Place 1 drop into both eyes as needed (for dry eyes).   valACYclovir 500 MG tablet Commonly known as:  VALTREX Take 1 tablet (500 mg total) by mouth 2 (two) times daily. What changed:  when to take this  reasons to take this   VALIUM 5 MG tablet Generic drug:  diazepam Take 5 mg by mouth every 6 (six) hours as needed for muscle spasms.   Vitamin D (Ergocalciferol) 50000 units Caps capsule Commonly known as:  DRISDOL One tablet every other week What changed:  how much to take  how to take this  when to take this  additional instructions   zolpidem 10 MG tablet Commonly known as:  AMBIEN Take 10 mg by mouth at bedtime as needed for sleep.       Activity: As instructed.  No driving for a couple of weeks.  Ambulate frequently.  No heavy lifting. Diet: low fat, low cholesterol diet Wound Care: as directed  Follow-up:  With Dr. Dalbert Batman in 7-10 days.  Signed: Edsel Petrin. Dalbert Batman, M.D., FACS General and minimally invasive surgery Breast and Colorectal Surgery  12/06/2015, 6:17 AM

## 2015-12-06 NOTE — Discharge Instructions (Signed)
See above  Be sure to keep a written record of the drainage.  Be sure to move your shoulders around as instructed.

## 2015-12-08 ENCOUNTER — Telehealth: Payer: Self-pay | Admitting: Oncology

## 2015-12-08 ENCOUNTER — Other Ambulatory Visit: Payer: Self-pay | Admitting: Oncology

## 2015-12-08 NOTE — Progress Notes (Signed)
[place]  [phone]) [phone] Fax:([phone]) [phone]     ID: [name] DOB: [birth year]  MR#: [id number]  AJO#:[id number]  Patient Care Team: Seward Carol, MD as PCP - General (Internal Medicine) Chauncey Cruel, MD as Consulting Physician (Oncology) Fanny Skates, MD as Consulting Physician (General Surgery) Luberta Robertson, MD (Radiology) Hennie Duos, MD as Consulting Physician (Rheumatology) Domingo Pulse, MD (Urology) Druscilla Brownie, MD as Consulting Physician (Dermatology) PCP: Kandice Hams, MD GYN: OTHER MD:  CHIEF COMPLAINT: Locally advanced breast cancer  CURRENT TREATMENT: awaiting definitive surgery  BREAST CANCER HISTORY: From the original intake note:  Lucrezia had routine mammographic screening in 08/18/2015 at the [facility], showing a right breast mass and possible axillary adenopathy. On 08/23/2015 she underwent bilateral diagnostic mammography with tomography and bilateral breast ultrasonography. The breast density was category B in the right breast there was a spiculated mass in the upper-outer quadrant measuring 1.2 cm and associated with pleomorphic calcifications. There were calcifications extending anteriorly and posteriorly from the mass worrisome for ductal carcinoma in situ. The total area in question is about 3 cm. There were also multiple enlarged right axillary lymph nodes. The mass was palpated as an area of fullness at the 12:00 position of the breast 3 cm from the nipple. There were palpable enlarged right axillary lymph nodes in the right axilla. Ultrasonography of the right breast confirmed an irregular hypoechoic mass in the area in question measuring 1.6 cm. There were multiple abnormal appearing enlarged right axillary lymph nodes, the largest measuring 2.3 cm.  In the left breast, mammography showed an area of architectural distortion associated with the left nipple. This was not palpable. Ultrasonography of the left  breast found no correlate. This area of the left breast was biopsied under tomography 08/24/2015. This showed (SAA [id number]) a complex sclerosing lesion and fibroadenoma.  On 08/23/2015 the patient underwent biopsy of the right breast mass in 1 of the abnormal right axillary lymph nodes. 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 Both biopsies were HER-2 nonamplified, the breast being ratio 1.27 and number per cell 1.90, and the lymph node 1 ratio 1.24 and number per cell 3.0.  On 08/26/2015 the patient underwent bilateral breast MRI. In the left breast there was no suspicious enhancement. The left axilla was benign. In the right breast there was a mass described as in the upper inner quadrant measuring 2.3 cm. Less than a centimeter away from this mass there was a satellite nodule measuring 0.9 cm. In the right axilla there were multiple enlarged right axillary lymph nodes, including a confluent region measuring up to 4 cm. There were also enlarged level II lymph nodes present measuring up to 2.1 cm.  The patient's subsequent history is as detailed below  INTERVAL HISTORY: [name] returns today for follow up of her breast cancer, accompanied by her husband [name]. Today is day 8 cycle 4 of 4 planned cycles of cyclophosphamide and docetaxel.   Since her last visit here she is had a repeat breast MRI which shows a very favorable but not complete response  REVIEW OF SYSTEMS: As with prior cycles, [name] again developed a rash and required a Medrol Dosepak. She continues to tear. She was very short of breath and her legs felt weak. She came here for IV fluids and that helped some. She has been  taking more Pepcid and that is helping as well. She does have surrounding nose and a bit of a dry cough. She short of breath particularly when walking upstairs.  She bruises easily. She continues to have pain "all over" and also now some numbness in her finger and toe tips, which is something that did develop with prior cycles but usually had resolved by this point. She is having hot flashes. A detailed review of systems today was otherwise stable  PAST MEDICAL HISTORY: Past Medical History:  Diagnosis Date  . Arthritis   . Asthma   . Cancer [facility])    breast cancer  . Chronic vaginitis   . Fibromyalgia   . GERD (gastroesophageal reflux disease)   . Hypoglycemia   . Hypoglycemia   . Hypothyroidism   . Interstitial cystitis   . Menopausal symptoms   . Osteoporosis   . Pneumonia   . PONV (postoperative nausea and vomiting)   . Thyroid disease    hypothyroid    PAST SURGICAL HISTORY: Past Surgical History:  Procedure Laterality Date  . BLADDER HYPEREXTENSION     X4  . BREAST LUMPECTOMY WITH NEEDLE LOCALIZATION AND AXILLARY LYMPH NODE DISSECTION Right 12/05/2015   Procedure: RIGHT BREAST NEEDLE LOCALIZED (WIRES X 2) LUMPECTOMY WITH AXILLARY LYMPH NODE DISSECTION;  Surgeon: Fanny Skates, MD;  Location: [facility];  Service: General;  Laterality: Right;  . BREAST LUMPECTOMY WITH RADIOACTIVE SEED LOCALIZATION Left 12/05/2015   Procedure: RADIOACTIVE SEED GUIDED LEFT BREAST LUMPECTOMY;  Surgeon: Fanny Skates, MD;  Location: [facility];  Service: General;  Laterality: Left;  . BUNIONECTOMY    . [place]     DR. INGRAM  . CHOLECYSTECTOMY  01/2006  . COLONOSCOPY    . DENTAL SURGERY     13 dental implants  . LEG SURGERY  06/2004   Broken tibia  . MOUTH SURGERY    . NASAL SINUS SURGERY     X4  . PORT-A-CATH REMOVAL Right 12/05/2015   Procedure: REMOVAL PORT-A-CATH;  Surgeon: Fanny Skates, MD;  Location: [facility];  Service: General;  Laterality: Right;  . PORTACATH PLACEMENT Left 09/08/2015   Procedure: INSERTION OF PORT-A-CATH ;  Surgeon: Fanny Skates, MD;  Location: [facility] OR;  Service: General;  Laterality: Left;    FAMILY HISTORY Family  History  Problem Relation Age of Onset  . Hypertension Mother   . Heart disease Mother   . Parkinsonism Father   . Heart disease Sister   . Cancer Sister     MELANOMA  . Breast cancer Sister     Age 69  The patient's father died at age 42 from complications of Parkinson's disease. The patient's mother died age 57 with heart disease. Ramia had no brothers. She had 2 sisters. One sister was diagnosed with breast cancer at age 94. She was tested for the BRCA gene and was negative. The same sister also had melanoma diagnosed in her 6s. The patient has one maternal great aunt diagnosed with breast cancer around age 52. There is no history of ovarian cancer in the family  GYNECOLOGIC HISTORY:  No LMP recorded. Patient is postmenopausal. Menarche age 79, first live birth age 62. The patient is GX P2. She stopped having periods in her early 65s and took hormone replacement until her breast cancer diagnosis, April 2017.  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 He has been in remission for more than 10 years. Their daughter [name] lives in [place] were she is Production designer, theatre/television/film of the [employer]. Daughter [name] is an infectious disease M.D. working and teaching at [employer] in [place]. The patient has one granddaughter, 30 months old as of May 2017. The family attends [employer]: Not in place   HEALTH MAINTENANCE: Social History  Substance Use Topics  . Smoking status: Former Smoker    Packs/day: 0.25    Years: 15.00    Types: Cigarettes  . Smokeless tobacco: Never Used  . Alcohol use 0.0 oz/week     Comment: social     Colonoscopy: 2016/ [facility]  PAP: 2015  Bone density:   Lipid panel:  Allergies  Allergen Reactions  . Nitrofurantoin Other (See  Comments)    Unknown reaction  . Penicillins Other (See Comments)    Has patient had a PCN reaction causing immediate rash, facial/tongue/throat swelling, SOB or lightheadedness with hypotension: No Has patient had a PCN reaction causing severe rash involving mucus membranes or skin necrosis: No Has patient had a PCN reaction that required hospitalization No Has patient had a PCN reaction occurring within the last 10 years: No If all of the above answers are "NO", then may proceed with Cephalosporin use.  . Chlorhexidine Other (See Comments)    [place] IS OK. The CLOTH WIPE causes reaction. Causes itching all over the area  . Fluconazole Other (See Comments)    REACTION: elavated LFTs - only happened once  . Other Rash    Adhesive tape    Current Outpatient Prescriptions  Medication Sig Dispense Refill  . albuterol (PROVENTIL HFA;VENTOLIN HFA) 108 (90 Base) MCG/ACT inhaler Inhale 1-2 puffs into the lungs every 6 (six) hours as needed for wheezing or shortness of breath.    Marland Kitchen buPROPion (WELLBUTRIN XL) 300 MG 24 hr tablet Take 300 mg by mouth daily.      . calcitonin, salmon, (MIACALCIN/FORTICAL) 200 UNIT/ACT nasal spray Place 1 spray into alternate nostrils daily. 3.7 mL 11  . Calcium Carbonate-Vitamin D (CALCIUM + D PO) Take 1 tablet by mouth daily.     . carisoprodol (SOMA) 350 MG tablet Take 350 mg by mouth 2 (two) times daily as needed for muscle spasms.   2  . clotrimazole (MYCELEX) 10 MG troche Take 1 tablet (10 mg total) by mouth 5 (five) times daily. (Patient taking differently: Take 10 mg by mouth 5 (five) times daily as needed (for mouth sores). ) 150 tablet 0  . cycloSPORINE (RESTASIS) 0.05 % ophthalmic emulsion Place 1 drop into both eyes 2 (two) times daily. (Patient taking differently: Place 1 drop into both eyes daily. ) 5.5 mL 0  . diazepam (VALIUM) 5 MG tablet Take 5 mg by mouth every 6 (six) hours as needed for muscle spasms.     . fluconazole (DIFLUCAN) 100 MG tablet Take 1  tablet (100 mg total) by mouth daily. (Patient taking differently: Take 100 mg by mouth daily as needed (for infection). ) 30 tablet 0  . HYDROcodone-acetaminophen (NORCO) 10-325 MG tablet Take 1 tablet by mouth every 4 (four) hours as needed for moderate pain or severe pain.    . hydrOXYzine (ATARAX/VISTARIL) 25 MG tablet Take 1 tablet (25 mg total) by mouth 3 (three) times daily as needed for anxiety or itching. 30 tablet 0  . levothyroxine (SYNTHROID) 137 MCG tablet Take  137 mcg by mouth daily.     Marland Kitchen lidocaine-prilocaine (EMLA) cream Apply to affected area once (Patient taking differently: Apply 1 application topically as needed (for rash). ) 30 g 3  . LORazepam (ATIVAN) 0.5 MG tablet Take 1 tablet (0.5 mg total) by mouth at bedtime as needed (Nausea or vomiting). 30 tablet 0  . Multiple Vitamin (MULTIVITAMIN) capsule Take 1 capsule by mouth daily.      . nabumetone (RELAFEN) 750 MG tablet Take 750 mg by mouth 2 (two) times daily as needed for mild pain.    Marland Kitchen oxyCODONE (OXY IR/ROXICODONE) 5 MG immediate release tablet Take 1-2 tablets (5-10 mg total) by mouth every 6 (six) hours as needed for severe pain. 40 tablet 0  . pantoprazole (PROTONIX) 40 MG tablet Take 40 mg by mouth daily.    Vladimir Faster Glycol-Propyl Glycol (SYSTANE OP) Place 1 drop into both eyes as needed (for dry eyes).    . prochlorperazine (COMPAZINE) 10 MG tablet Take 1 tablet (10 mg total) by mouth every 6 (six) hours as needed (Nausea or vomiting). 30 tablet 1  . triamterene-hydrochlorothiazide (DYAZIDE) 37.5-25 MG capsule Take 1 tablet by mouth daily.     . valACYclovir (VALTREX) 500 MG tablet Take 1 tablet (500 mg total) by mouth 2 (two) times daily. (Patient taking differently: Take 500 mg by mouth 2 (two) times daily as needed (for mouth sores). ) 60 tablet 1  . Vitamin D, Ergocalciferol, (DRISDOL) 50000 UNITS CAPS capsule One tablet every other week (Patient taking differently: Take 50,000 Units by mouth every 14 (fourteen)  days. One tablet every other week) 10 capsule 3  . zolpidem (AMBIEN) 10 MG tablet Take 10 mg by mouth at bedtime as needed for sleep.      No current facility-administered medications for this visit.     OBJECTIVE: Middle-aged white woman who appears stated age There were no vitals filed for this visit.   There is no height or weight on file to calculate BMI.    ECOG FS:1 - Symptomatic but completely ambulatory  Sclerae unicteric, EOMs intact, epiphora L > R Oropharynx clear and moist No cervical or supraclavicular adenopathy Lungs no rales or rhonchi Heart regular rate and rhythm Abd soft, nontender, positive bowel sounds MSK no focal spinal tenderness, no upper extremity lymphedema Neuro: nonfocal, well oriented, appropriate affect Breasts: Deferred    LAB RESULTS:  CMP     Component Value Date/Time   NA 135 12/06/2015 0436   NA 134 (L) 11/21/2015 1527   K 3.5 12/06/2015 0436   K 4.8 11/21/2015 1527   CL 101 12/06/2015 0436   CO2 25 12/06/2015 0436   CO2 26 11/21/2015 1527   GLUCOSE 119 (H) 12/06/2015 0436   GLUCOSE 112 11/21/2015 1527   BUN 9 12/06/2015 0436   BUN 10.3 11/21/2015 1527   CREATININE 0.63 12/06/2015 0436   CREATININE 0.8 11/21/2015 1527   CALCIUM 8.8 (L) 12/06/2015 0436   CALCIUM 9.1 11/21/2015 1527   PROT 6.2 (L) 12/01/2015 1445   PROT 6.3 (L) 11/21/2015 1527   ALBUMIN 3.7 12/01/2015 1445   ALBUMIN 3.2 (L) 11/21/2015 1527   AST 18 12/01/2015 1445   AST 13 11/21/2015 1527   ALT 13 (L) 12/01/2015 1445   ALT 14 11/21/2015 1527   ALKPHOS 65 12/01/2015 1445   ALKPHOS 74 11/21/2015 1527   BILITOT 0.3 12/01/2015 1445   BILITOT 0.52 11/21/2015 1527   GFRNONAA >60 12/06/2015 0436   GFRAA >60 12/06/2015 1191  INo results found for: SPEP, UPEP  Lab Results  Component Value Date   WBC 15.4 (H) 12/06/2015   NEUTROABS 13.3 (H) 12/01/2015   HGB 10.4 (L) 12/06/2015   HCT 32.4 (L) 12/06/2015   MCV 101.3 (H) 12/06/2015   PLT 256 12/06/2015       Chemistry      Component Value Date/Time   NA 135 12/06/2015 0436   NA 134 (L) 11/21/2015 1527   K 3.5 12/06/2015 0436   K 4.8 11/21/2015 1527   CL 101 12/06/2015 0436   CO2 25 12/06/2015 0436   CO2 26 11/21/2015 1527   BUN 9 12/06/2015 0436   BUN 10.3 11/21/2015 1527   CREATININE 0.63 12/06/2015 0436   CREATININE 0.8 11/21/2015 1527      Component Value Date/Time   CALCIUM 8.8 (L) 12/06/2015 0436   CALCIUM 9.1 11/21/2015 1527   ALKPHOS 65 12/01/2015 1445   ALKPHOS 74 11/21/2015 1527   AST 18 12/01/2015 1445   AST 13 11/21/2015 1527   ALT 13 (L) 12/01/2015 1445   ALT 14 11/21/2015 1527   BILITOT 0.3 12/01/2015 1445   BILITOT 0.52 11/21/2015 1527       No results found for: LABCA2  No components found for: LABCA125  No results for input(s): INR in the last 168 hours.  Urinalysis No results found for: COLORURINE, APPEARANCEUR, LABSPEC, PHURINE, GLUCOSEU, HGBUR, BILIRUBINUR, KETONESUR, PROTEINUR, UROBILINOGEN, NITRITE, LEUKOCYTESUR    ELIGIBLE FOR AVAILABLE RESEARCH PROTOCOL: P[place]AS  STUDIES: Mr Breast Bilateral W Wo Contrast  Result Date: 11/16/2015 CLINICAL DATA:  Patient initially diagnosed with invasive and in situ ductal carcinoma in the right breast at the 12 o'clock axis. Subsequent MRI-guided biopsy revealed additional invasive carcinoma in the inner right breast (slightly inferior and medial to the initially diagnosed carcinoma). Patient is status post chemotherapy. Patient also status post stereotactic-guided biopsy of the left breast, 1 o'clock axis, with pathology result of complex sclerosing lesion. LABS:  Not applicable EXAM: BILATERAL BREAST MRI WITH AND WITHOUT CONTRAST TECHNIQUE: Multiplanar, multisequence MR images of both breasts were obtained prior to and following the intravenous administration of 15 ml of MultiHance. THREE-DIMENSIONAL MR IMAGE RENDERING ON INDEPENDENT WORKSTATION: Three-dimensional MR images were rendered by post-processing of the  original MR data on an independent workstation. The three-dimensional MR images were interpreted, and findings are reported in the following complete MRI report for this study. Three dimensional images were evaluated at the independent DynaCad workstation COMPARISON:  Breast MRI dated 08/26/2015. Breast MRI biopsy dated 09/13/2015. Previous mammograms including diagnostic mammograms dated 08/24/2015 and 09/13/2015. FINDINGS: Breast composition: b. Scattered fibroglandular tissue. Background parenchymal enhancement: Mild Right breast: The irregular mass within the upper right breast, 12-1 o'clock axis region, at anterior depth, with biopsy clip artifact, consistent with patient's known carcinoma (patient's first confirmed carcinoma), has significantly decreased in size, now measuring 0.8 x 0.4 x 0.8 cm (previously measured at 2.3 x 2.3 x 1.2 cm) (series 6, image 114). There is a postoperative seroma within the inner right breast, measuring 2.4 x 1.6 cm, with adjacent biopsy clip artifact, corresponding to the second confirmed site of carcinoma in the right breast. There is no persistent abnormal mass or non-mass enhancement at this second site of biopsy-proven carcinoma (previously an irregular enhancing mass measuring 0.9 x 0.7 cm). There are no new areas of suspicious mass or non-mass enhancement within the right breast. Left breast: Post biopsy change noted within the outer left breast, with associated biopsy clip artifact, corresponding to  site of biopsy-proven complex sclerosing lesion. There is also decreased enhancement at this biopsy site, with only a 3 mm focus of residual contrast enhancement adjacent to the biopsy clip artifact. No new enhancing mass or non-mass enhancement within the left breast. Lymph nodes: Significantly decreased size of the right axillary lymph nodes, including the largest lymph node which contains a biopsy clip corresponding to the biopsy-proven lymph node metastasis. No new or  intervally enlarged lymph nodes in the right axilla. No enlarged or morphologically abnormal lymph nodes identified within the left axilla or bilateral internal mammary chain regions. Ancillary findings:  None. IMPRESSION: 1. Overall good response to interval treatment, as follows. 2. Biopsy-proven carcinoma within the upper RIGHT breast, 12-1 o'clock axis region, is significantly decreased in size with a current measurement of 0.8 x 0.4 x 0.8 cm (previously 2.3 x 2.3 x 1.2 cm). 3. Complete MRI response at the second site of biopsy-proven carcinoma within the inner RIGHT breast (no persistent mass or non-mass enhancement at this second site of biopsy-proven carcinoma). 4. Significantly decreased size of all lymph nodes in the RIGHT axilla, including the lymph node which contains the biopsy clip corresponding to the biopsy-proven lymph node metastasis. 5. Decreased size/extent of the enhancement at the site of biopsy-proven complex sclerosing lesion in the LEFT breast. RECOMMENDATION: Per current treatment plan. BI-RADS CATEGORY  6: Known biopsy-proven malignancy. Electronically Signed   By: Franki Cabot M.D.   On: 11/16/2015 15:35   Mm Breast Surgical Specimen  Result Date: 12/05/2015 CLINICAL DATA:  Post right breast lumpectomy. EXAM: SPECIMEN RADIOGRAPH OF THE RIGHT BREAST COMPARISON:  Previous exam(s). FINDINGS: Status post excision of the right breast. The two wire tips and biopsy marker clips are present and are marked for pathology. IMPRESSION: Specimen radiograph of the right breast. Electronically Signed   By: Everlean Alstrom M.D.   On: 12/05/2015 15:22   Mm Breast Surgical Specimen  Result Date: 12/05/2015 CLINICAL DATA:  Post left breast excision. EXAM: SPECIMEN RADIOGRAPH OF THE LEFT BREAST COMPARISON:  Previous exam(s). FINDINGS: Status post excision of the left breast. The radioactive seed and biopsy marker clip are present, completely intact, and were marked for pathology. IMPRESSION: Specimen  radiograph of the left breast. Electronically Signed   By: Everlean Alstrom M.D.   On: 12/05/2015 14:27   Mm Lt Radioactive Seed Loc Mammo Guide  Result Date: 12/05/2015 CLINICAL DATA:  Preoperative localization for removal of left breast lesion. EXAM: MAMMOGRAPHIC GUIDED RADIOACTIVE SEED LOCALIZATION OF THE LEFT BREAST COMPARISON:  Previous exam(s). FINDINGS: Patient presents for radioactive seed localization prior to surgical excision of left breast lesion. I met with the patient and we discussed the procedure of seed localization including benefits and alternatives. We discussed the high likelihood of a successful procedure. We discussed the risks of the procedure including infection, bleeding, tissue injury and further surgery. We discussed the low dose of radioactivity involved in the procedure. Informed, written consent was given. The usual time-out protocol was performed immediately prior to the procedure. Using mammographic guidance, sterile technique, 1% lidocaine and an I-125 radioactive seed, the X shaped clip was localized using a superior craniocaudal approach. The follow-up mammogram images confirm the seed in the expected location and were marked for Dr. Dalbert Batman. Follow-up survey of the patient confirms presence of the radioactive seed. Order number of I-125 seed:  [id number]. Total activity:  1.975 millicuries  Reference Date: 11/25/2015 The patient tolerated the procedure well and was released from the [facility]. She was given  instructions regarding seed removal. IMPRESSION: Radioactive seed localization left breast. No apparent complications. Electronically Signed   By: Altamese Cabal M.D.   On: 12/05/2015 11:47   Mm Rt Plc Breast Loc Dev   1st Lesion  Inc Mammo Guide  Result Date: 12/05/2015 CLINICAL DATA:  Preoperative needle localization for removal of right breast malignancy. EXAM: NEEDLE LOCALIZATION OF THE RIGHT BREAST WITH MAMMO GUIDANCE COMPARISON:  Previous exams. FINDINGS:  Patient presents for needle localization prior to surgical excision of right breast malignancy. I met with the patient and we discussed the procedure of needle localization including benefits and alternatives. We discussed the high likelihood of a successful procedure. We discussed the risks of the procedure, including infection, bleeding, tissue injury, and further surgery. Informed, written consent was given. The usual time-out protocol was performed immediately prior to the procedure. Using mammographic guidance, sterile technique, 1% lidocaine and two 5 cm modified Kopans needles, the ribbon shaped clip and hourglass shaped clip are localized using superior craniocaudal approach. The images were marked for Dr. Dalbert Batman. IMPRESSION: Needle localization right breast. No apparent complications. Electronically Signed   By: Altamese Cabal M.D.   On: 12/05/2015 11:45   Mm Rt Plc Breast Loc Dev   Ea Add Lesion  Inc Mammo Guide  Result Date: 12/05/2015 CLINICAL DATA:  Preoperative needle localization for removal of right breast malignancy. EXAM: NEEDLE LOCALIZATION OF THE RIGHT BREAST WITH MAMMO GUIDANCE COMPARISON:  Previous exams. FINDINGS: Patient presents for needle localization prior to surgical excision of right breast malignancy. I met with the patient and we discussed the procedure of needle localization including benefits and alternatives. We discussed the high likelihood of a successful procedure. We discussed the risks of the procedure, including infection, bleeding, tissue injury, and further surgery. Informed, written consent was given. The usual time-out protocol was performed immediately prior to the procedure. Using mammographic guidance, sterile technique, 1% lidocaine and two 5 cm modified Kopans needles, the ribbon shaped clip and hourglass shaped clip are localized using superior craniocaudal approach. The images were marked for Dr. Dalbert Batman. IMPRESSION: Needle localization right breast. No  apparent complications. Electronically Signed   By: Altamese Cabal M.D.   On: 12/05/2015 11:45    ASSESSMENT: 70 y.o. [place] woman status post right breast upper outer quadrant and right axillary lymph node biopsy 08/23/2015, both positive for a clinical T1 N2, stage IIIA invasive ductal carcinoma, grade 2, strongly estrogen receptor positive, 5% progesterone receptor positive, with an MIB-120-30% and no HER-2 amplification  (a) biopsy of a 0.9 cm Right breast satellite nodule 09/13/2015 showed invasive ductal carcinoma, grade 2, E-cadherin strongly positive, with a prognostic panel identical to the larger right upper quadrant mass  (1) left breast biopsy 08/24/2015 shows a complex sclerosing lesion  (2) neoadjuvant chemotherapy consisting of cyclophosphamide and docetaxel every 21 days 4, with OnPro support, started 09/12/2015, completed 11/14/2015  (3) definitive surgery to follow  (4) adjuvant radiation to follow surgery  (5) anti-estrogens for 10 years to follow at the completion of local treatment-- consider PALLAS trial  (6) genetics testing pending ([place]ken[place] descent)  PLAN: Layza has completed her neoadjuvant chemotherapy and has had a very good response. She has not had a complete radiologic response and in fact complete radiologic/pathologic responses are unusual in estrogen receptor positive patients. I am very pleased with how well she has done overall.  She is now ready to proceed to surgery. She hopes this will be done early August. She will see me the first  week in September chiefly to review the pathology report and to start planning anti-estrogen therapy. Of course once she recovers from her surgery she will start radiation  We reviewed neutropenic precautions. I do not feel she needs prophylactic antibiotics at this point.  She knows to call for any problems that may develop before her next visit here.   Chauncey Cruel, MD  12/08/2015

## 2015-12-08 NOTE — Telephone Encounter (Signed)
aspoke with pt to confirm 8/18 appt at 2 pm per GM

## 2015-12-12 NOTE — Progress Notes (Signed)
Location of Breast Cancer: Breast cancer of upper-outer quadrant of right female breast  Histology per Pathology Report:   12/05/15 Diagnosis 1. Breast, lumpectomy, Left COMPLEX SCLEROSING LESION WITH CALCIFICATIONS FIBROADENOMA AND SCLEROSING ADENOSIS 2. 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 - SEE MICROSCOPIC DESCRIPTION.  08/24/15 Diagnosis Breast, left, needle core biopsy, 1:00 o'clock - COMPLEX SCLEROSING LESION WITH CALCIFICATIONS. - FIBROADENOMA. - SEE COMMENT  Receptor Status: ER(100%), PR (5%), Her2-neu (negative), Ki-(5%)  Did patient present with symptoms (if so, please note symptoms) or was this found on screening mammography?: screening mammogram  Past/Anticipated interventions by surgeon, if any:   12/05/15 - Procedure: RIGHT BREAST NEEDLE LOCALIZED (WIRES X 2) LUMPECTOMY WITH AXILLARY LYMPH NODE DISSECTION;  Surgeon: Fanny Skates, MD  Past/Anticipated interventions by medical oncology, if any: neoadjuvant chemotherapy consisting of cyclophosphamide and docetaxel every 21 days 4, with OnPro support, started 09/12/2015, completed 11/14/2015.  Anti-estrogens for 10 years to follow at the completion of local treatment-- consider PALLAS trial. 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  She does take Oxycodone for fibromyalgia.  Gyn Onc:No LMP recorded. Patient is  postmenopausal.  Menarche age 59, first live birth age 30. The patient is GX P2. She stopped having periods in her early 81s and took hormone replacement until her breast cancer diagnosis, April 2017.   SAFETY ISSUES:  Prior radiation? no  Pacemaker/ICD? no  Possible current pregnancy?no  Is the patient on methotrexate? no  Current Complaints / other details:  Patient is here with her husband.  BP 112/66 (BP Location: Left Arm, Patient Position: Sitting)   Pulse 73   Temp 98.3 F (36.8 C) (Oral)   Ht 5' 2"  (1.575 m)   Wt 167 lb (75.8 kg)   SpO2 95%   BMI 30.54 kg/m    Wt Readings from Last 3 Encounters:  12/21/15 167 lb (75.8 kg)  12/16/15 168 lb 1.6 oz (76.2 kg)  12/05/15 177 lb 6.4 oz (80.5 kg)   Brianna Liner, RN 12/12/2015,9:04 AM

## 2015-12-15 ENCOUNTER — Encounter (HOSPITAL_COMMUNITY): Payer: Self-pay | Admitting: Emergency Medicine

## 2015-12-15 ENCOUNTER — Emergency Department (HOSPITAL_COMMUNITY): Payer: Medicare Other

## 2015-12-15 ENCOUNTER — Emergency Department (HOSPITAL_COMMUNITY)
Admission: EM | Admit: 2015-12-15 | Discharge: 2015-12-16 | Disposition: A | Discharge status: Home / Self Care | Payer: Medicare Other | Attending: Emergency Medicine | Admitting: Emergency Medicine

## 2015-12-15 DIAGNOSIS — Z791 Long term (current) use of non-steroidal anti-inflammatories (NSAID): Secondary | ICD-10-CM | POA: Diagnosis not present

## 2015-12-15 DIAGNOSIS — E039 Hypothyroidism, unspecified: Secondary | ICD-10-CM | POA: Diagnosis not present

## 2015-12-15 DIAGNOSIS — Z853 Personal history of malignant neoplasm of breast: Secondary | ICD-10-CM | POA: Insufficient documentation

## 2015-12-15 DIAGNOSIS — I10 Essential (primary) hypertension: Secondary | ICD-10-CM | POA: Insufficient documentation

## 2015-12-15 DIAGNOSIS — M791 Myalgia: Secondary | ICD-10-CM | POA: Diagnosis not present

## 2015-12-15 DIAGNOSIS — Z87891 Personal history of nicotine dependence: Secondary | ICD-10-CM | POA: Diagnosis not present

## 2015-12-15 DIAGNOSIS — Z79899 Other long term (current) drug therapy: Secondary | ICD-10-CM | POA: Insufficient documentation

## 2015-12-15 DIAGNOSIS — J45909 Unspecified asthma, uncomplicated: Secondary | ICD-10-CM | POA: Diagnosis not present

## 2015-12-15 DIAGNOSIS — R509 Fever, unspecified: Secondary | ICD-10-CM | POA: Diagnosis not present

## 2015-12-15 LAB — URINALYSIS, ROUTINE W REFLEX MICROSCOPIC
BILIRUBIN URINE: NEGATIVE
GLUCOSE, UA: NEGATIVE mg/dL
HGB URINE DIPSTICK: NEGATIVE
KETONES UR: NEGATIVE mg/dL
LEUKOCYTES UA: NEGATIVE
Nitrite: NEGATIVE
PH: 6.5 (ref 5.0–8.0)
PROTEIN: NEGATIVE mg/dL
Specific Gravity, Urine: 1.014 (ref 1.005–1.030)

## 2015-12-15 LAB — COMPREHENSIVE METABOLIC PANEL
ALBUMIN: 3.6 g/dL (ref 3.5–5.0)
ALT: 12 U/L — ABNORMAL LOW (ref 14–54)
ANION GAP: 11 (ref 5–15)
AST: 20 U/L (ref 15–41)
Alkaline Phosphatase: 67 U/L (ref 38–126)
BILIRUBIN TOTAL: 1 mg/dL (ref 0.3–1.2)
BUN: 10 mg/dL (ref 6–20)
CALCIUM: 8.6 mg/dL — AB (ref 8.9–10.3)
CO2: 23 mmol/L (ref 22–32)
CREATININE: 0.83 mg/dL (ref 0.44–1.00)
Chloride: 101 mmol/L (ref 101–111)
GFR calc Af Amer: >60 mL/min (ref >60)
GFR calc non Af Amer: >60 mL/min (ref >60)
GLUCOSE: 111 mg/dL — AB (ref 65–99)
Potassium: 3.4 mmol/L — ABNORMAL LOW (ref 3.5–5.1)
SODIUM: 135 mmol/L (ref 135–145)
TOTAL PROTEIN: 6.6 g/dL (ref 6.5–8.1)

## 2015-12-15 LAB — CBC WITH DIFFERENTIAL/PLATELET
BASOS ABS: 0 10*3/uL (ref 0.0–0.1)
BASOS PCT: 0 %
EOS ABS: 0.3 10*3/uL (ref 0.0–0.7)
EOS PCT: 2 %
HCT: 36 % (ref 36.0–46.0)
Hemoglobin: 12.1 g/dL (ref 12.0–15.0)
Lymphocytes Relative: 6 %
Lymphs Abs: 1 10*3/uL (ref 0.7–4.0)
MCH: 33.2 pg (ref 26.0–34.0)
MCHC: 33.6 g/dL (ref 30.0–36.0)
MCV: 98.6 fL (ref 78.0–100.0)
MONO ABS: 1.5 10*3/uL — AB (ref 0.1–1.0)
MONOS PCT: 9 %
NEUTROS ABS: 14.1 10*3/uL — AB (ref 1.7–7.7)
Neutrophils Relative %: 83 %
PLATELETS: 382 10*3/uL (ref 150–400)
RBC: 3.65 MIL/uL — ABNORMAL LOW (ref 3.87–5.11)
RDW: 16.6 % — AB (ref 11.5–15.5)
WBC: 16.8 10*3/uL — ABNORMAL HIGH (ref 4.0–10.5)

## 2015-12-15 LAB — I-STAT CG4 LACTIC ACID, ED: LACTIC ACID, VENOUS: 1.13 mmol/L (ref 0.5–1.9)

## 2015-12-15 MED ORDER — FENTANYL CITRATE (PF) 100 MCG/2ML IJ SOLN
50.0000 ug | Freq: Once | INTRAMUSCULAR | Status: AC
  Administered 2015-12-15: 50 ug via INTRAVENOUS
  Filled 2015-12-15: qty 2

## 2015-12-15 MED ORDER — ACETAMINOPHEN 325 MG PO TABS
650.0000 mg | ORAL_TABLET | Freq: Once | ORAL | Status: AC
  Administered 2015-12-15: 650 mg via ORAL
  Filled 2015-12-15: qty 2

## 2015-12-15 MED ORDER — SODIUM CHLORIDE 0.9 % IV BOLUS (SEPSIS)
1000.0000 mL | Freq: Once | INTRAVENOUS | Status: AC
  Administered 2015-12-15: 1000 mL via INTRAVENOUS

## 2015-12-15 NOTE — ED Triage Notes (Signed)
Pt states she is 10 days post op from having breast surgery with resection and 4 weeks post chemo treatments  Pt states she has pain all over and today started running a fever  Pt states she feels almost flu like aching all over   Pt does not have a port a cath  Dr Dalbert Batman did her surgery and she sees Dr Fredric Dine at the cancer center

## 2015-12-15 NOTE — ED Notes (Signed)
PA at bedside.

## 2015-12-15 NOTE — ED Notes (Signed)
Bed: [facility] Expected date:  Expected time:  Means of arrival:  Comments: Rada Hay

## 2015-12-16 ENCOUNTER — Ambulatory Visit (HOSPITAL_BASED_OUTPATIENT_CLINIC_OR_DEPARTMENT_OTHER): Payer: Medicare Other | Admitting: Oncology

## 2015-12-16 ENCOUNTER — Other Ambulatory Visit: Payer: Self-pay | Admitting: Oncology

## 2015-12-16 VITALS — BP 129/64 | HR 84 | Temp 98.6°F | Resp 18 | Wt 168.1 lb

## 2015-12-16 DIAGNOSIS — Z170 Estrogen receptor positive status [ER+]: Secondary | ICD-10-CM

## 2015-12-16 DIAGNOSIS — C50411 Malignant neoplasm of upper-outer quadrant of right female breast: Secondary | ICD-10-CM | POA: Diagnosis present

## 2015-12-16 DIAGNOSIS — C773 Secondary and unspecified malignant neoplasm of axilla and upper limb lymph nodes: Secondary | ICD-10-CM | POA: Diagnosis not present

## 2015-12-16 DIAGNOSIS — M545 Low back pain: Secondary | ICD-10-CM | POA: Diagnosis not present

## 2015-12-16 DIAGNOSIS — R509 Fever, unspecified: Secondary | ICD-10-CM | POA: Diagnosis not present

## 2015-12-16 MED ORDER — CYCLOSPORINE 0.05 % OP EMUL: 1.0000 [drp] | Freq: Two times a day (BID) | OPHTHALMIC | 0 refills | Status: DC

## 2015-12-16 MED ORDER — OXYCODONE HCL 5 MG PO TABS: 5.0000 mg | ORAL_TABLET | Freq: Four times a day (QID) | ORAL | 0 refills | Status: DC | PRN

## 2015-12-16 MED ORDER — FENTANYL CITRATE (PF) 100 MCG/2ML IJ SOLN
50.0000 ug | Freq: Once | INTRAMUSCULAR | Status: AC
  Administered 2015-12-16: 50 ug via INTRAVENOUS
  Filled 2015-12-16: qty 2

## 2015-12-16 MED ORDER — CEPHALEXIN 500 MG PO CAPS
500.0000 mg | ORAL_CAPSULE | Freq: Four times a day (QID) | ORAL | 0 refills | Status: DC
Start: 2015-12-16 — End: 2016-01-30

## 2015-12-16 MED ORDER — CEPHALEXIN 500 MG PO CAPS
500.0000 mg | ORAL_CAPSULE | Freq: Once | ORAL | Status: AC
  Administered 2015-12-16: 500 mg via ORAL
  Filled 2015-12-16: qty 1

## 2015-12-16 MED ORDER — CAPECITABINE 500 MG PO TABS: 1500.0000 mg | ORAL_TABLET | Freq: Two times a day (BID) | ORAL | 6 refills | Status: DC

## 2015-12-16 NOTE — Discharge Instructions (Signed)
Keflex as prescribed until all gone for possible skin infection around surgical site. Continue all other medications. Follow up with your oncologist as scheduled. Follow up with general surgeon for wound recheck

## 2015-12-16 NOTE — Progress Notes (Signed)
[name]  [phone] [phone]     ID: [name] DOB: 1945/12/09  MR#: [id number]  JYN#:[id number]  Patient Care Team: Seward Carol, MD as PCP - General (Internal Medicine) Chauncey Cruel, MD as Consulting Physician (Oncology) Fanny Skates, MD as Consulting Physician (General Surgery) Luberta Robertson, MD (Radiology) Hennie Duos, MD as Consulting Physician (Rheumatology) Domingo Pulse, MD (Urology) Druscilla Brownie, MD as Consulting Physician (Dermatology) OTHER MD:  CHIEF COMPLAINT: Locally advanced breast cancer  CURRENT TREATMENT: awaiting adjuvant radiation  BREAST CANCER HISTORY: From the original intake note:  [name] had routine mammographic screening in 08/18/2015 at the [facility], showing a right breast mass and possible axillary adenopathy. On 08/23/2015 she underwent bilateral diagnostic mammography with tomography and bilateral breast ultrasonography. The breast density was category B in the right breast there was a spiculated mass in the upper-outer quadrant measuring 1.2 cm and associated with pleomorphic calcifications. There were calcifications extending anteriorly and posteriorly from the mass worrisome for ductal carcinoma in situ. The total area in question is about 3 cm. There were also multiple enlarged right axillary lymph nodes. The mass was palpated as an area of fullness at the 12:00 position of the breast 3 cm from the nipple. There were palpable enlarged right axillary lymph nodes in the right axilla. Ultrasonography of the right breast confirmed an irregular hypoechoic mass in the area in question measuring 1.6 cm. There were multiple abnormal appearing enlarged right axillary lymph nodes, the largest measuring 2.3 cm.  In the left breast, mammography showed an area of architectural distortion associated with the left nipple. This was not palpable. Ultrasonography of the left breast found no correlate. This  area of the left breast was biopsied under tomography 08/24/2015. This showed ([id number]) a complex sclerosing lesion and fibroadenoma.  On 08/23/2015 the patient underwent biopsy of the right breast mass in 1 of the abnormal right axillary lymph nodes. 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 Both biopsies were HER-2 nonamplified, the breast being ratio 1.27 and number per cell 1.90, and the lymph node 1 ratio 1.24 and number per cell 3.0.  On 08/26/2015 the patient underwent bilateral breast MRI. In the left breast there was no suspicious enhancement. The left axilla was benign. In the right breast there was a mass described as in the upper inner quadrant measuring 2.3 cm. Less than a centimeter away from this mass there was a satellite nodule measuring 0.9 cm. In the right axilla there were multiple enlarged right axillary lymph nodes, including a confluent region measuring up to 4 cm. There were also enlarged level II lymph nodes present measuring up to 2.1 cm.  The patient's subsequent history is as detailed below  INTERVAL HISTORY: [name] returns today for follow up of her estrogen receptor positive breast cancer, accompanied by her husband [name]. Since her last visit here she underwent a left lumpectomy, which showed only a complex sclerosing lesion. More importantly, she had a right lumpectomy and axillary lymph node dissection 12/05/2015, which showed ([id number]) a residual invasive ductal carcinoma, grade 2, measuring 1.5 cm, with DCIS within a millimeter of the anterior resection margin, and with 15 out of 17 axillary lymph nodes involved by tumor, with evidence of extracapsular extension. She is here today to discuss those results  REVIEW OF SYSTEMS: [name] did generally  well with the surgery, but she presented to the emergency  room yesterday with a complaint of fever. The white cell count was elevated, but on the labs were fine as were the vitals and chest x-ray was negative. She had mild erythema around the drainage catheter exit site and she was started on Keflex. So far she is tolerating that well. She has been afebrile today. Other issues include her hair falling out, back pain which is not a new problem, her legs feeling "like lead", persistent grade 1 tingling involving her fingertips and feet, shortness of breath, nausea, decreased appetite, and some postoperative pain, controlled on her hydrocodone every 4 hours plus occasional oxycodone. A detailed review of systems was otherwise stable  PAST MEDICAL HISTORY: Past Medical History:  Diagnosis Date  . Arthritis   . Asthma   . Cancer [facility])    breast cancer  . Chronic vaginitis   . Fibromyalgia   . GERD (gastroesophageal reflux disease)   . Hypoglycemia   . Hypoglycemia   . Hypothyroidism   . Interstitial cystitis   . Menopausal symptoms   . Osteoporosis   . Pneumonia   . PONV (postoperative nausea and vomiting)   . Thyroid disease    hypothyroid    PAST SURGICAL HISTORY: Past Surgical History:  Procedure Laterality Date  . BLADDER HYPEREXTENSION     X4  . BREAST LUMPECTOMY WITH NEEDLE LOCALIZATION AND AXILLARY LYMPH NODE DISSECTION Right 12/05/2015   Procedure: RIGHT BREAST NEEDLE LOCALIZED (WIRES X 2) LUMPECTOMY WITH AXILLARY LYMPH NODE DISSECTION;  Surgeon: Fanny Skates, MD;  Location: [place];  Service: General;  Laterality: Right;  . BREAST LUMPECTOMY WITH RADIOACTIVE SEED LOCALIZATION Left 12/05/2015   Procedure: RADIOACTIVE SEED GUIDED LEFT BREAST LUMPECTOMY;  Surgeon: Fanny Skates, MD;  Location: [place];  Service: General;  Laterality: Left;  . BUNIONECTOMY    . [place]     DR. INGRAM  . CHOLECYSTECTOMY  01/2006  . COLONOSCOPY    . DENTAL SURGERY     13 dental implants  . LEG SURGERY  06/2004   Broken tibia  . MOUTH SURGERY      . NASAL SINUS SURGERY     X4  . PORT-A-CATH REMOVAL Right 12/05/2015   Procedure: REMOVAL PORT-A-CATH;  Surgeon: Fanny Skates, MD;  Location: [place];  Service: General;  Laterality: Right;  . PORTACATH PLACEMENT Left 09/08/2015   Procedure: INSERTION OF PORT-A-CATH ;  Surgeon: Fanny Skates, MD;  Location: [facility] OR;  Service: General;  Laterality: Left;    FAMILY HISTORY Family History  Problem Relation Age of Onset  . Hypertension Mother   . Heart disease Mother   . Parkinsonism Father   . Heart disease Sister   . Cancer Sister     MELANOMA  . Breast cancer Sister     Age 73  The patient's father died at age 12 from complications of Parkinson's disease. The patient's mother died age 55 with heart disease. Foster had no brothers. She had 2 sisters. One sister was diagnosed with breast cancer at age 45. She was tested for the BRCA gene and was negative. The same sister also had melanoma diagnosed in her 67s. The patient has one maternal great aunt diagnosed with breast cancer around age 76. There is no history of ovarian cancer in the family  GYNECOLOGIC HISTORY:  No LMP recorded. Patient is postmenopausal. Menarche age 41, first live birth age 75. The patient is GX P2. She stopped having periods in her  early 27s and took hormone replacement until her breast cancer diagnosis, April 2017.  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 He has been in remission for more than 10 years. Their daughter [name] lives in [place] were she is Production designer, theatre/television/film of the [employer]. Daughter [name] is an infectious disease M.D. working and teaching at [employer] in [place]. The patient has one granddaughter, 11 months old as of May 2017. The family attends [employer]: Not in  place   HEALTH MAINTENANCE: Social History  Substance Use Topics  . Smoking status: Former Smoker    Packs/day: 0.25    Years: 15.00    Types: Cigarettes  . Smokeless tobacco: Never Used  . Alcohol use 0.0 oz/week     Comment: social     Colonoscopy: 2016/ [facility]  PAP: 2015  Bone density:   Lipid panel:  Allergies  Allergen Reactions  . Nitrofurantoin Other (See Comments)    Unknown reaction  . Penicillins Other (See Comments)    Has patient had a PCN reaction causing immediate rash, facial/tongue/throat swelling, SOB or lightheadedness with hypotension: No Has patient had a PCN reaction causing severe rash involving mucus membranes or skin necrosis: No Has patient had a PCN reaction that required hospitalization No Has patient had a PCN reaction occurring within the last 10 years: No If all of the above answers are "NO", then may proceed with Cephalosporin use.  . Chlorhexidine Other (See Comments)    Vansant IS OK. The CLOTH WIPE causes reaction. Causes itching all over the area  . Fluconazole Other (See Comments)    REACTION: elavated LFTs - only happened once  . Other Rash    Adhesive tape    Current Outpatient Prescriptions  Medication Sig Dispense Refill  . albuterol (PROVENTIL HFA;VENTOLIN HFA) 108 (90 Base) MCG/ACT inhaler Inhale 1-2 puffs into the lungs every 6 (six) hours as needed for wheezing or shortness of breath.    Marland Kitchen buPROPion (WELLBUTRIN XL) 300 MG 24 hr tablet Take 300 mg by mouth daily.      . calcitonin, salmon, (MIACALCIN/FORTICAL) 200 UNIT/ACT nasal spray Place 1 spray into alternate nostrils daily. 3.7 mL 11  . Calcium Carbonate-Vitamin D (CALCIUM + D PO) Take 1 tablet by mouth at bedtime.     . carisoprodol (SOMA) 350 MG tablet Take 350 mg by mouth 2 (two) times daily as needed for muscle spasms.   2  . cephALEXin (KEFLEX) 500 MG capsule Take 1 capsule (500 mg total) by mouth 4 (four) times daily. 40 capsule 0  . clotrimazole (MYCELEX) 10 MG troche  Take 1 tablet (10 mg total) by mouth 5 (five) times daily. (Patient not taking: Reported on 12/15/2015) 150 tablet 0  . cycloSPORINE (RESTASIS) 0.05 % ophthalmic emulsion Place 1 drop into both eyes 2 (two) times daily. (Patient taking differently: Place 1 drop into both eyes daily as needed (dry eyes). ) 5.5 mL 0  . diazepam (VALIUM) 5 MG tablet Take 5 mg by mouth every 6 (six) hours as needed for muscle spasms.     . fluconazole (DIFLUCAN) 100 MG tablet Take 1 tablet (100 mg total) by mouth daily. (Patient not taking: Reported on 12/15/2015) 30 tablet 0  . HYDROcodone-acetaminophen (NORCO) 10-325 MG tablet Take 1 tablet by  mouth every 4 (four) hours as needed for moderate pain or severe pain.    . hydrOXYzine (ATARAX/VISTARIL) 25 MG tablet Take 1 tablet (25 mg total) by mouth 3 (three) times daily as needed for anxiety or itching. 30 tablet 0  . levothyroxine (SYNTHROID) 137 MCG tablet Take 137 mcg by mouth daily.     Marland Kitchen lidocaine-prilocaine (EMLA) cream Apply to affected area once (Patient not taking: Reported on 12/15/2015) 30 g 3  . LORazepam (ATIVAN) 0.5 MG tablet Take 1 tablet (0.5 mg total) by mouth at bedtime as needed (Nausea or vomiting). 30 tablet 0  . Multiple Vitamin (MULTIVITAMIN) capsule Take 1 capsule by mouth daily.      . nabumetone (RELAFEN) 750 MG tablet Take 750 mg by mouth 2 (two) times daily as needed for mild pain.    Marland Kitchen oxyCODONE (OXY IR/ROXICODONE) 5 MG immediate release tablet Take 1-2 tablets (5-10 mg total) by mouth every 6 (six) hours as needed for severe pain. 40 tablet 0  . pantoprazole (PROTONIX) 40 MG tablet Take 40 mg by mouth daily.    Vladimir Faster Glycol-Propyl Glycol (SYSTANE OP) Place 1 drop into both eyes as needed (for dry eyes).    . prochlorperazine (COMPAZINE) 10 MG tablet Take 1 tablet (10 mg total) by mouth every 6 (six) hours as needed (Nausea or vomiting). 30 tablet 1  . triamterene-hydrochlorothiazide (DYAZIDE) 37.5-25 MG capsule Take 1 tablet by mouth  daily.     . valACYclovir (VALTREX) 500 MG tablet Take 1 tablet (500 mg total) by mouth 2 (two) times daily. (Patient not taking: Reported on 12/15/2015) 60 tablet 1  . Vitamin D, Ergocalciferol, (DRISDOL) 50000 UNITS CAPS capsule One tablet every other week (Patient taking differently: Take 50,000 Units by mouth every 14 (fourteen) days. One tablet every other week) 10 capsule 3  . zolpidem (AMBIEN) 10 MG tablet Take 10 mg by mouth at bedtime as needed for sleep.      No current facility-administered medications for this visit.     OBJECTIVE: Middle-aged white woman In no acute distress Vitals:   12/16/15 1417  BP: 129/64  Pulse: 84  Resp: 18  Temp: 98.6 F (37 C)     Body mass index is 29.78 kg/m.    ECOG FS:1 - Symptomatic but completely ambulatory  Sclerae unicteric, continuing epiphora Oropharynx clear and moist-- no thrush or other lesions No cervical or supraclavicular adenopathy Lungs no rales or rhonchi Heart regular rate and rhythm Abd soft, nontender, positive bowel sounds MSK no focal spinal tenderness, no upper extremity lymphedema Neuro: nonfocal, well oriented, appropriate affect Breasts: Status post bilateral lumpectomies and right axillary lymph node dissection, with the right drain still in place. There is no evidence of infection associated with the drain, only minimal inflammation around the exit site. There is 25-30 mL of seizures and was fluid in the bulb   LAB RESULTS:  CMP     Component Value Date/Time   NA 135 12/15/2015 2044   NA 134 (L) 11/21/2015 1527   K 3.4 (L) 12/15/2015 2044   K 4.8 11/21/2015 1527   CL 101 12/15/2015 2044   CO2 23 12/15/2015 2044   CO2 26 11/21/2015 1527   GLUCOSE 111 (H) 12/15/2015 2044   GLUCOSE 112 11/21/2015 1527   BUN 10 12/15/2015 2044   BUN 10.3 11/21/2015 1527   CREATININE 0.83 12/15/2015 2044   CREATININE 0.8 11/21/2015 1527   CALCIUM 8.6 (L) 12/15/2015 2044   CALCIUM 9.1 11/21/2015 1527  PROT 6.6 12/15/2015  2044   PROT 6.3 (L) 11/21/2015 1527   ALBUMIN 3.6 12/15/2015 2044   ALBUMIN 3.2 (L) 11/21/2015 1527   AST 20 12/15/2015 2044   AST 13 11/21/2015 1527   ALT 12 (L) 12/15/2015 2044   ALT 14 11/21/2015 1527   ALKPHOS 67 12/15/2015 2044   ALKPHOS 74 11/21/2015 1527   BILITOT 1.0 12/15/2015 2044   BILITOT 0.52 11/21/2015 1527   GFRNONAA >60 12/15/2015 2044   GFRAA >60 12/15/2015 2044    INo results found for: SPEP, UPEP  Lab Results  Component Value Date   WBC 16.8 (H) 12/15/2015   NEUTROABS 14.1 (H) 12/15/2015   HGB 12.1 12/15/2015   HCT 36.0 12/15/2015   MCV 98.6 12/15/2015   PLT 382 12/15/2015      Chemistry      Component Value Date/Time   NA 135 12/15/2015 2044   NA 134 (L) 11/21/2015 1527   K 3.4 (L) 12/15/2015 2044   K 4.8 11/21/2015 1527   CL 101 12/15/2015 2044   CO2 23 12/15/2015 2044   CO2 26 11/21/2015 1527   BUN 10 12/15/2015 2044   BUN 10.3 11/21/2015 1527   CREATININE 0.83 12/15/2015 2044   CREATININE 0.8 11/21/2015 1527      Component Value Date/Time   CALCIUM 8.6 (L) 12/15/2015 2044   CALCIUM 9.1 11/21/2015 1527   ALKPHOS 67 12/15/2015 2044   ALKPHOS 74 11/21/2015 1527   AST 20 12/15/2015 2044   AST 13 11/21/2015 1527   ALT 12 (L) 12/15/2015 2044   ALT 14 11/21/2015 1527   BILITOT 1.0 12/15/2015 2044   BILITOT 0.52 11/21/2015 1527       No results found for: LABCA2  No components found for: LABCA125  No results for input(s): INR in the last 168 hours.  Urinalysis    Component Value Date/Time   COLORURINE YELLOW 12/15/2015 2208   APPEARANCEUR CLEAR 12/15/2015 2208   LABSPEC 1.014 12/15/2015 2208   PHURINE 6.5 12/15/2015 2208   GLUCOSEU NEGATIVE 12/15/2015 2208   HGBUR NEGATIVE 12/15/2015 2208   BILIRUBINUR NEGATIVE 12/15/2015 2208   KETONESUR NEGATIVE 12/15/2015 2208   PROTEINUR NEGATIVE 12/15/2015 2208   NITRITE NEGATIVE 12/15/2015 2208   LEUKOCYTESUR NEGATIVE 12/15/2015 2208      ELIGIBLE FOR AVAILABLE RESEARCH PROTOCOL:  PALLAS  STUDIES: Dg Chest 2 View  Result Date: 12/15/2015 CLINICAL DATA:  Breast cancer. Ten days postop for breast surgery. Diffuse body pain and fever. EXAM: CHEST  2 VIEW COMPARISON:  One-view chest x-ray 09/08/2015. FINDINGS: Linear densities at the lung bases bilaterally likely reflect atelectasis. No other significant airspace disease is present. The heart size is normal. The upper lung fields are clear. A surgical drain extends into the right axilla. Surgical clips are present in the right axilla. Surgical clips are present within both breasts. IMPRESSION: 1. Linear densities at the lung bases bilaterally likely reflect atelectasis. 2. No other acute airspace disease. 3. Right axillary drain and surgical clips. 4. Surgical clips in both breasts. Electronically Signed   By: San Morelle M.D.   On: 12/15/2015 21:55   Mr Breast Bilateral W Wo Contrast  Result Date: 11/16/2015 CLINICAL DATA:  Patient initially diagnosed with invasive and in situ ductal carcinoma in the right breast at the 12 o'clock axis. Subsequent MRI-guided biopsy revealed additional invasive carcinoma in the inner right breast (slightly inferior and medial to the initially diagnosed carcinoma). Patient is status post chemotherapy. Patient also status post stereotactic-guided biopsy  of the left breast, 1 o'clock axis, with pathology result of complex sclerosing lesion. LABS:  Not applicable EXAM: BILATERAL BREAST MRI WITH AND WITHOUT CONTRAST TECHNIQUE: Multiplanar, multisequence MR images of both breasts were obtained prior to and following the intravenous administration of 15 ml of MultiHance. THREE-DIMENSIONAL MR IMAGE RENDERING ON INDEPENDENT WORKSTATION: Three-dimensional MR images were rendered by post-processing of the original MR data on an independent workstation. The three-dimensional MR images were interpreted, and findings are reported in the following complete MRI report for this study. Three dimensional images  were evaluated at the independent DynaCad workstation COMPARISON:  Breast MRI dated 08/26/2015. Breast MRI biopsy dated 09/13/2015. Previous mammograms including diagnostic mammograms dated 08/24/2015 and 09/13/2015. FINDINGS: Breast composition: b. Scattered fibroglandular tissue. Background parenchymal enhancement: Mild Right breast: The irregular mass within the upper right breast, 12-1 o'clock axis region, at anterior depth, with biopsy clip artifact, consistent with patient's known carcinoma (patient's first confirmed carcinoma), has significantly decreased in size, now measuring 0.8 x 0.4 x 0.8 cm (previously measured at 2.3 x 2.3 x 1.2 cm) (series 6, image 114). There is a postoperative seroma within the inner right breast, measuring 2.4 x 1.6 cm, with adjacent biopsy clip artifact, corresponding to the second confirmed site of carcinoma in the right breast. There is no persistent abnormal mass or non-mass enhancement at this second site of biopsy-proven carcinoma (previously an irregular enhancing mass measuring 0.9 x 0.7 cm). There are no new areas of suspicious mass or non-mass enhancement within the right breast. Left breast: Post biopsy change noted within the outer left breast, with associated biopsy clip artifact, corresponding to site of biopsy-proven complex sclerosing lesion. There is also decreased enhancement at this biopsy site, with only a 3 mm focus of residual contrast enhancement adjacent to the biopsy clip artifact. No new enhancing mass or non-mass enhancement within the left breast. Lymph nodes: Significantly decreased size of the right axillary lymph nodes, including the largest lymph node which contains a biopsy clip corresponding to the biopsy-proven lymph node metastasis. No new or intervally enlarged lymph nodes in the right axilla. No enlarged or morphologically abnormal lymph nodes identified within the left axilla or bilateral internal mammary chain regions. Ancillary findings:   None. IMPRESSION: 1. Overall good response to interval treatment, as follows. 2. Biopsy-proven carcinoma within the upper RIGHT breast, 12-1 o'clock axis region, is significantly decreased in size with a current measurement of 0.8 x 0.4 x 0.8 cm (previously 2.3 x 2.3 x 1.2 cm). 3. Complete MRI response at the second site of biopsy-proven carcinoma within the inner RIGHT breast (no persistent mass or non-mass enhancement at this second site of biopsy-proven carcinoma). 4. Significantly decreased size of all lymph nodes in the RIGHT axilla, including the lymph node which contains the biopsy clip corresponding to the biopsy-proven lymph node metastasis. 5. Decreased size/extent of the enhancement at the site of biopsy-proven complex sclerosing lesion in the LEFT breast. RECOMMENDATION: Per current treatment plan. BI-RADS CATEGORY  6: Known biopsy-proven malignancy. Electronically Signed   By: Franki Cabot M.D.   On: 11/16/2015 15:35   Mm Breast Surgical Specimen  Result Date: 12/05/2015 CLINICAL DATA:  Post right breast lumpectomy. EXAM: SPECIMEN RADIOGRAPH OF THE RIGHT BREAST COMPARISON:  Previous exam(s). FINDINGS: Status post excision of the right breast. The two wire tips and biopsy marker clips are present and are marked for pathology. IMPRESSION: Specimen radiograph of the right breast. Electronically Signed   By: Everlean Alstrom M.D.   On: 12/05/2015  15:22   Mm Breast Surgical Specimen  Result Date: 12/05/2015 CLINICAL DATA:  Post left breast excision. EXAM: SPECIMEN RADIOGRAPH OF THE LEFT BREAST COMPARISON:  Previous exam(s). FINDINGS: Status post excision of the left breast. The radioactive seed and biopsy marker clip are present, completely intact, and were marked for pathology. IMPRESSION: Specimen radiograph of the left breast. Electronically Signed   By: Everlean Alstrom M.D.   On: 12/05/2015 14:27   Mm Lt Radioactive Seed Loc Mammo Guide  Result Date: 12/05/2015 CLINICAL DATA:  Preoperative  localization for removal of left breast lesion. EXAM: MAMMOGRAPHIC GUIDED RADIOACTIVE SEED LOCALIZATION OF THE LEFT BREAST COMPARISON:  Previous exam(s). FINDINGS: Patient presents for radioactive seed localization prior to surgical excision of left breast lesion. I met with the patient and we discussed the procedure of seed localization including benefits and alternatives. We discussed the high likelihood of a successful procedure. We discussed the risks of the procedure including infection, bleeding, tissue injury and further surgery. We discussed the low dose of radioactivity involved in the procedure. Informed, written consent was given. The usual time-out protocol was performed immediately prior to the procedure. Using mammographic guidance, sterile technique, 1% lidocaine and an I-125 radioactive seed, the X shaped clip was localized using a superior craniocaudal approach. The follow-up mammogram images confirm the seed in the expected location and were marked for Dr. Dalbert Batman. Follow-up survey of the patient confirms presence of the radioactive seed. Order number of I-125 seed:  [id number]. Total activity:  9.292 millicuries  Reference Date: 11/25/2015 The patient tolerated the procedure well and was released from the [place]. She was given instructions regarding seed removal. IMPRESSION: Radioactive seed localization left breast. No apparent complications. Electronically Signed   By: Altamese Cabal M.D.   On: 12/05/2015 11:47   Mm Rt Plc Breast Loc Dev   1st Lesion  Inc Mammo Guide  Result Date: 12/05/2015 CLINICAL DATA:  Preoperative needle localization for removal of right breast malignancy. EXAM: NEEDLE LOCALIZATION OF THE RIGHT BREAST WITH MAMMO GUIDANCE COMPARISON:  Previous exams. FINDINGS: Patient presents for needle localization prior to surgical excision of right breast malignancy. I met with the patient and we discussed the procedure of needle localization including benefits and  alternatives. We discussed the high likelihood of a successful procedure. We discussed the risks of the procedure, including infection, bleeding, tissue injury, and further surgery. Informed, written consent was given. The usual time-out protocol was performed immediately prior to the procedure. Using mammographic guidance, sterile technique, 1% lidocaine and two 5 cm modified Kopans needles, the ribbon shaped clip and hourglass shaped clip are localized using superior craniocaudal approach. The images were marked for Dr. Dalbert Batman. IMPRESSION: Needle localization right breast. No apparent complications. Electronically Signed   By: Altamese Cabal M.D.   On: 12/05/2015 11:45   Mm Rt Plc Breast Loc Dev   Ea Add Lesion  Inc Mammo Guide  Result Date: 12/05/2015 CLINICAL DATA:  Preoperative needle localization for removal of right breast malignancy. EXAM: NEEDLE LOCALIZATION OF THE RIGHT BREAST WITH MAMMO GUIDANCE COMPARISON:  Previous exams. FINDINGS: Patient presents for needle localization prior to surgical excision of right breast malignancy. I met with the patient and we discussed the procedure of needle localization including benefits and alternatives. We discussed the high likelihood of a successful procedure. We discussed the risks of the procedure, including infection, bleeding, tissue injury, and further surgery. Informed, written consent was given. The usual time-out protocol was performed immediately prior to the procedure. Using  mammographic guidance, sterile technique, 1% lidocaine and two 5 cm modified Kopans needles, the ribbon shaped clip and hourglass shaped clip are localized using superior craniocaudal approach. The images were marked for Dr. Dalbert Batman. IMPRESSION: Needle localization right breast. No apparent complications. Electronically Signed   By: Altamese Cabal M.D.   On: 12/05/2015 11:45    ASSESSMENT: 70 y.o. [place] woman status post right breast upper outer quadrant and right  axillary lymph node biopsy 08/23/2015, both positive for a clinical T1 N2, stage IIIA invasive ductal carcinoma, grade 2, strongly estrogen receptor positive, 5% progesterone receptor positive, with an MIB-120-30% and no HER-2 amplification  (a) biopsy of a 0.9 cm Right breast satellite nodule 09/13/2015 showed invasive ductal carcinoma, grade 2, E-cadherin strongly positive, with a prognostic panel identical to the larger right upper quadrant mass  (1) left breast biopsy 08/24/2015 shows a complex sclerosing lesion, excised 12/05/2015  (2) neoadjuvant chemotherapy consisting of cyclophosphamide and docetaxel every 21 days 4, with OnPro support, started 09/12/2015, completed 11/14/2015  (3) status post right lumpectomy and sentinel lymph node dissection 12/05/2015 for a residual pT2 pN3, stage IIIC invasive ductal carcinoma, grade 2, with a close anterior margin (skin)  (4) adjuvant radiation to follow with concurrent capecitabine sensitization  (5) capecitabine to be continued for total of 6 months adjuvantly  (6) anti-estrogens for 10 years to follow at the completion of local treatment-- consider PALLAS trial  (7) 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 I think the slight fever she had may be related to her drainage catheter or perhaps to atelectasis. She is tolerating the Keflex well and we continually of that brief course.  We spent approximately 35 minutes discussing the fact that she has been upstaged because of the number of axillary lymph nodes. For that reason I am going to add capecitabine to be given concurrently with her radiation treatments and then continued to a total of 6 months afterwards. That has been shown to decrease the risk of relapse. It will also be useful if we can obtain a PET scan later this year to serve as a new baseline study  I would like her to participate in the [place] trial once she starts anti-estrogens,  which will most likely be anastrozole   I think she would benefit from a postop camisole and wrote for that. She is going to see me later this month, by which time I hope the drain will be out and she will be ready to start the radiation treatments. We will discuss the capecitabine further at that time.  She knows to call for any problems that may develop before then.  Chauncey Cruel, MD  12/16/2015

## 2015-12-16 NOTE — ED Provider Notes (Signed)
[facility] DEPT Provider Note   CSN: [id number] Arrival date & time: 12/15/15  1856     History   Chief Complaint Chief Complaint  Patient presents with  . Fever    HPI [name] is a 70 y.o. female.  HPI [name] is a 70 y.o. female with multiple medical problems, presents to emergency department complaining of fever. Patient is 10 days status post right breast lumpectomy, lymph node dissection, left breast lumpectomy and lymph node dissection. Patient states she is having pain in the breast, having pain all over. She denies any headache, neck pain or stiffness, URI symptoms, cough, nausea, vomiting, diarrhea. She is currently not on any chemotherapy. She has been taking her medications at home with no relief of symptoms. Fever just started today.  Past Medical History:  Diagnosis Date  . Arthritis   . Asthma   . Cancer [facility])    breast cancer  . Chronic vaginitis   . Fibromyalgia   . GERD (gastroesophageal reflux disease)   . Hypoglycemia   . Hypoglycemia   . Hypothyroidism   . Interstitial cystitis   . Menopausal symptoms   . Osteoporosis   . Pneumonia   . PONV (postoperative nausea and vomiting)   . Thyroid disease    hypothyroid    Patient Active Problem List   Diagnosis Date Noted  . Breast cancer, right breast ([facility]) 12/05/2015  . Rash and nonspecific skin eruption 10/03/2015  . Breast cancer of upper-outer quadrant of right female breast ([place] [facility]) 09/01/2015  . 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 Menopausal symptoms   . Chronic vaginitis   . Fibromyalgia   . Interstitial cystitis   . UNSPECIFIED HYPOTHYROIDISM 06/14/2009  . PURE HYPERCHOLESTEROLEMIA 06/14/2009  . DEPRESSIVE DISORDER NOT ELSEWHERE CLASSIFIED 06/14/2009  . PERIPHERAL NEUROPATHY 06/14/2009  . ESSENTIAL HYPERTENSION, BENIGN 06/14/2009  . UNSPEC HEMORRHOIDS WITHOUT MENTION COMPLICATION [id number]  . ALLERGIC RHINITIS CAUSE UNSPECIFIED 06/14/2009  . ESOPHAGEAL  REFLUX 06/14/2009  . ACUTE GASTRITIS WITHOUT MENTION OF HEMORRHAGE 06/14/2009  . IRRITABLE BOWEL SYNDROME 06/14/2009  . INSOMNIA UNSPECIFIED 06/14/2009  . INTERSTITIAL CYSTITIS 08/27/2007  . VULVOVAGINITIS, CHRONIC 08/27/2007  . Fibromyalgia syndrome 08/27/2007  . Osteoporosis 08/27/2007    Past Surgical History:  Procedure Laterality Date  . BLADDER HYPEREXTENSION     X4  . BREAST LUMPECTOMY WITH NEEDLE LOCALIZATION AND AXILLARY LYMPH NODE DISSECTION Right 12/05/2015   Procedure: RIGHT BREAST NEEDLE LOCALIZED (WIRES X 2) LUMPECTOMY WITH AXILLARY LYMPH NODE DISSECTION;  Surgeon: Fanny Skates, MD;  Location: [facility];  Service: General;  Laterality: Right;  . BREAST LUMPECTOMY WITH RADIOACTIVE SEED LOCALIZATION Left 12/05/2015   Procedure: RADIOACTIVE SEED GUIDED LEFT BREAST LUMPECTOMY;  Surgeon: Fanny Skates, MD;  Location: [facility];  Service: General;  Laterality: Left;  . BUNIONECTOMY    . [place]     DR. INGRAM  . CHOLECYSTECTOMY  01/2006  . COLONOSCOPY    . DENTAL SURGERY     13 dental implants  . LEG SURGERY  06/2004   Broken tibia  . MOUTH SURGERY    . NASAL SINUS SURGERY     X4  . PORT-A-CATH REMOVAL Right 12/05/2015   Procedure: REMOVAL PORT-A-CATH;  Surgeon: Fanny Skates, MD;  Location: [facility];  Service: General;  Laterality: Right;  . PORTACATH PLACEMENT Left 09/08/2015   Procedure: INSERTION OF PORT-A-CATH ;  Surgeon: Fanny Skates, MD;  Location: [facility];  Service: General;  Laterality: Left;    OB History  Gravida Para Term Preterm AB Living   2 2 2     2    SAB TAB Ectopic Multiple Live Births                   Home Medications    Prior to Admission medications   Medication Sig Start Date End Date Taking? Authorizing Provider  buPROPion (WELLBUTRIN XL) 300 MG 24 hr tablet Take 300 mg by mouth daily.     Yes Historical Provider, MD  calcitonin, salmon, (MIACALCIN/FORTICAL) 200 UNIT/ACT nasal spray Place 1 spray into alternate nostrils daily. 03/21/15   Yes Terrance Mass, MD  Calcium Carbonate-Vitamin D (CALCIUM + D PO) Take 1 tablet by mouth at bedtime.    Yes Historical Provider, MD  carisoprodol (SOMA) 350 MG tablet Take 350 mg by mouth 2 (two) times daily as needed for muscle spasms.  11/23/15  Yes Historical Provider, MD  cycloSPORINE (RESTASIS) 0.05 % ophthalmic emulsion Place 1 drop into both eyes 2 (two) times daily. Patient taking differently: Place 1 drop into both eyes daily as needed (dry eyes).  10/17/15  Yes Chauncey Cruel, MD  HYDROcodone-acetaminophen (NORCO) 10-325 MG tablet Take 1 tablet by mouth every 4 (four) hours as needed for moderate pain or severe pain.   Yes Historical Provider, MD  hydrOXYzine (ATARAX/VISTARIL) 25 MG tablet Take 1 tablet (25 mg total) by mouth 3 (three) times daily as needed for anxiety or itching. 10/10/15  Yes Chauncey Cruel, MD  levothyroxine (SYNTHROID) 137 MCG tablet Take 137 mcg by mouth daily.    Yes Historical Provider, MD  LORazepam (ATIVAN) 0.5 MG tablet Take 1 tablet (0.5 mg total) by mouth at bedtime as needed (Nausea or vomiting). 09/01/15  Yes Chauncey Cruel, MD  Multiple Vitamin (MULTIVITAMIN) capsule Take 1 capsule by mouth daily.     Yes Historical Provider, MD  pantoprazole (PROTONIX) 40 MG tablet Take 40 mg by mouth daily.   Yes Historical Provider, MD  Polyethyl Glycol-Propyl Glycol (SYSTANE OP) Place 1 drop into both eyes as needed (for dry eyes).   Yes Historical Provider, MD  prochlorperazine (COMPAZINE) 10 MG tablet Take 1 tablet (10 mg total) by mouth every 6 (six) hours as needed (Nausea or vomiting). 10/31/15  Yes Maryanna Shape, NP  triamterene-hydrochlorothiazide (DYAZIDE) 37.5-25 MG capsule Take 1 tablet by mouth daily.    Yes Historical Provider, MD  Vitamin D, Ergocalciferol, (DRISDOL) 50000 UNITS CAPS capsule One tablet every other week Patient taking differently: Take 50,000 Units by mouth every 14 (fourteen) days. One tablet every other week 03/21/15  Yes Terrance Mass, MD  zolpidem (AMBIEN) 10 MG tablet Take 10 mg by mouth at bedtime as needed for sleep.    Yes Historical Provider, MD  albuterol (PROVENTIL HFA;VENTOLIN HFA) 108 (90 Base) MCG/ACT inhaler Inhale 1-2 puffs into the lungs every 6 (six) hours as needed for wheezing or shortness of breath.    Historical Provider, MD  cephALEXin (KEFLEX) 500 MG capsule Take 1 capsule (500 mg total) by mouth 4 (four) times daily. 12/16/15   Haydan Wedig, PA-C  clotrimazole (MYCELEX) 10 MG troche Take 1 tablet (10 mg total) by mouth 5 (five) times daily. Patient not taking: Reported on 12/15/2015 10/31/15   Maryanna Shape, NP  diazepam (VALIUM) 5 MG tablet Take 5 mg by mouth every 6 (six) hours as needed for muscle spasms.     Historical Provider, MD  fluconazole (DIFLUCAN) 100 MG tablet Take 1 tablet (  100 mg total) by mouth daily. Patient not taking: Reported on 12/15/2015 11/14/15   Chauncey Cruel, MD  lidocaine-prilocaine (EMLA) cream Apply to affected area once Patient not taking: Reported on 12/15/2015 09/01/15   Chauncey Cruel, MD  nabumetone (RELAFEN) 750 MG tablet Take 750 mg by mouth 2 (two) times daily as needed for mild pain.    Historical Provider, MD  oxyCODONE (OXY IR/ROXICODONE) 5 MG immediate release tablet Take 1-2 tablets (5-10 mg total) by mouth every 6 (six) hours as needed for severe pain. 12/06/15   Fanny Skates, MD  valACYclovir (VALTREX) 500 MG tablet Take 1 tablet (500 mg total) by mouth 2 (two) times daily. Patient not taking: Reported on 12/15/2015 11/14/15   Chauncey Cruel, MD    Family History Family History  Problem Relation Age of Onset  . Hypertension Mother   . Heart disease Mother   . Parkinsonism Father   . Heart disease Sister   . Cancer Sister     MELANOMA  . Breast cancer Sister     Age 94    Social History Social History  Substance Use Topics  . Smoking status: Former Smoker    Packs/day: 0.25    Years: 15.00    Types: Cigarettes  . Smokeless  tobacco: Never Used  . Alcohol use 0.0 oz/week     Comment: social     Allergies   Nitrofurantoin; Penicillins; Chlorhexidine; Fluconazole; and Other   Review of Systems Review of Systems  Constitutional: Positive for chills and fever.  Respiratory: Negative for cough, chest tightness and shortness of breath.   Cardiovascular: Negative for chest pain, palpitations and leg swelling.  Gastrointestinal: Negative for abdominal pain, diarrhea, nausea and vomiting.  Genitourinary: Negative for dysuria, flank pain, pelvic pain, vaginal bleeding, vaginal discharge and vaginal pain.  Musculoskeletal: Positive for arthralgias and myalgias. Negative for neck pain and neck stiffness.  Skin: Positive for wound. Negative for rash.  Neurological: Negative for dizziness, weakness and headaches.  All other systems reviewed and are negative.    Physical Exam Updated Vital Signs BP 134/65 (BP Location: Left Arm)   Pulse 83   Temp 99.4 F (37.4 C) (Rectal)   Resp 18   SpO2 98%   Physical Exam  Constitutional: She appears well-developed and well-nourished. No distress.  HENT:  Head: Normocephalic.  Mouth/Throat: Oropharynx is clear and moist.  Eyes: Conjunctivae are normal.  Neck: Neck supple.  Cardiovascular: Normal rate, regular rhythm and normal heart sounds.   Pulmonary/Chest: Effort normal and breath sounds normal. No respiratory distress. She has no wheezes. She has no rales.  Abdominal: Soft. Bowel sounds are normal. She exhibits no distension. There is no tenderness. There is no rebound.  Incision to the bilateral lower breast, both healing well, no surrounding erythema, swelling dehiscence. JP drain in the right lower lateral breast. There is mild surrounding cellulitis, no drainage. TTP. JP is draining bloody clear fluid.  Musculoskeletal: She exhibits no edema.  Neurological: She is alert.  Skin: Skin is warm and dry.  Psychiatric: She has a normal mood and affect. Her behavior  is normal.  Nursing note and vitals reviewed.    ED Treatments / Results  Labs (all labs ordered are listed, but only abnormal results are displayed) Labs Reviewed  COMPREHENSIVE METABOLIC PANEL - Abnormal; Notable for the following:       Result Value   Potassium 3.4 (*)    Glucose, Bld 111 (*)    Calcium 8.6 (*)  ALT 12 (*)    All other components within normal limits  CBC WITH DIFFERENTIAL/PLATELET - Abnormal; Notable for the following:    WBC 16.8 (*)    RBC 3.65 (*)    RDW 16.6 (*)    Neutro Abs 14.1 (*)    Monocytes Absolute 1.5 (*)    All other components within normal limits  CULTURE, BLOOD (ROUTINE X 2)  CULTURE, BLOOD (ROUTINE X 2)  URINE CULTURE  URINALYSIS, ROUTINE W REFLEX MICROSCOPIC (NOT AT [facility])  I-STAT CG4 LACTIC ACID, ED  I-STAT CG4 LACTIC ACID, ED    EKG  EKG Interpretation None       Radiology Dg Chest 2 View  Result Date: 12/15/2015 CLINICAL DATA:  Breast cancer. Ten days postop for breast surgery. Diffuse body pain and fever. EXAM: CHEST  2 VIEW COMPARISON:  One-view chest x-ray 09/08/2015. FINDINGS: Linear densities at the lung bases bilaterally likely reflect atelectasis. No other significant airspace disease is present. The heart size is normal. The upper lung fields are clear. A surgical drain extends into the right axilla. Surgical clips are present in the right axilla. Surgical clips are present within both breasts. IMPRESSION: 1. Linear densities at the lung bases bilaterally likely reflect atelectasis. 2. No other acute airspace disease. 3. Right axillary drain and surgical clips. 4. Surgical clips in both breasts. Electronically Signed   By: San Morelle M.D.   On: 12/15/2015 21:55    Procedures Procedures (including critical care time)  Medications Ordered in ED Medications  fentaNYL (SUBLIMAZE) injection 50 mcg (50 mcg Intravenous Given 12/15/15 2130)  sodium chloride 0.9 % bolus 1,000 mL (0 mLs Intravenous Stopped 12/16/15  0035)  acetaminophen (TYLENOL) tablet 650 mg (650 mg Oral Given 12/15/15 2218)  fentaNYL (SUBLIMAZE) injection 50 mcg (50 mcg Intravenous Given 12/16/15 0031)  cephALEXin (KEFLEX) capsule 500 mg (500 mg Oral Given 12/16/15 0034)     Initial Impression / Assessment and Plan / ED Course  I have reviewed the triage vital signs and the nursing notes.  Pertinent labs & imaging results that were available during my care of the patient were reviewed by me and considered in my medical decision making (see chart for details).  Clinical Course  Patient seen and examined. Patient with fever onset today. She is 10 days after bilateral breast lumpectomy with lymph node dissection, still has JP in place in the right breast. Patient denies any other complaints at this time. She states she hurts all over. Will give some fentanyl, IV fluids, labs including blood cultures, lactic acid, CBC, metabolic panel, urinalysis, chest x-ray.   Labs show white count of 16.8. Metabolic panel unremarkable. Urinalysis is clear. Lactic acid is 1.13. Chest x-ray negative. Discussed with Dr. Lutricia Feil, who advised surgery consult. I spoke with Dr. Zella Richer, who advised that if there is no major swelling or erythema over the breast, it is doubtful there is major infection. Pt does have just a tiny amount of erythema just around JP drain. Will cover with keflex, first dose given in ED. Pt agreed to plan of dc with keflex and follow up with dr. Ron Agee tomorrow as scheduled.  Dr. Roderic Palau as seen pt as well, agrees to plan. Pt dc home with normal vital signs. No evidence of sepsis. Nad.   Final Clinical Impressions(s) / ED Diagnoses   Final diagnoses:  Fever, unspecified fever cause    New Prescriptions Discharge Medication List as of 12/16/2015 12:12 AM    START taking these medications  Details  cephALEXin (KEFLEX) 500 MG capsule Take 1 capsule (500 mg total) by mouth 4 (four) times daily., Starting Fri 12/16/2015, Print          [employer], PA-C 12/16/15 [id number]    Milton Ferguson, MD 12/16/15 [phone]

## 2015-12-17 LAB — URINE CULTURE: Culture: NO GROWTH

## 2015-12-20 LAB — CULTURE, BLOOD (ROUTINE X 2)
CULTURE: NO GROWTH
Culture: NO GROWTH

## 2015-12-20 NOTE — Progress Notes (Signed)
Radiation Oncology         [phone] ________________________________  Initial Outpatient Consultation  Name: [name] MRN: [id number]  Date: 12/21/2015  DOB: [birth year]  QQ:[id number],VFIEPP D, MD  Seward Carol, MD   REFERRING PHYSICIAN: Seward Carol, MD  DIAGNOSIS: Stage IIIC invasive ductal carcinoma (pT1c, pN3) of the right breast (ER/PR+, HER2 negative) status post lumpectomy and neoadjuvant chemotherapy  HISTORY OF PRESENT ILLNESS::[name] is a 70 y.o. female who had a routine mammogram on 08/18/2015 at the [facility], showing a right breast mass and possible axillary adenopathy.  On 08/23/2015 she underwent a bilateral diagnostic mammogram with tomography and bilateral breast ultrasound. The breast density was category B in the right breast there was a spiculated mass in the upper-outer quadrant measuring 1.2 cm and associated with pleomorphic calcifications. There were calcifications extending anteriorly and posteriorly from the mass worrisome for ductal carcinoma in situ. The total area in question was about 3 cm. There were also multiple enlarged right axillary lymph nodes. The mass was palpated as an area of fullness at the 12 o'clock position of the breast 3 cm from the nipple. There were palpable enlarged right axillary lymph nodes in the right axilla. Ultrasound of the right breast confirmed an irregular hypoechoic mass in the area in question measuring 1.6 cm. There were multiple abnormal appearing enlarged right axillary lymph nodes, the largest measuring 2.3 cm.  In the left breast, mammography showed an area of architectural distortion associated with the left nipple. This was not palpable. Ultrasound of the left breast found no correlation. This area of the left breast was biopsied under tomography 08/24/2015. This showed a complex sclerosing lesion with calcifications and fibroadenoma.  On 08/23/2015, biopsy of the right breast 12 o'clock position  revealed grade 2 invasive ductal carcinoma with DCIS (ER 100% +, PR 5% +, HER2 -, Ki67 20%). A partially sampled lymph node from the right axilla was positive for metastatic ductal carcinoma.  On 08/26/2015 the patient underwent bilateral breast MRI. In the left breast there was no suspicious enhancement. The left axilla was benign. In the right breast there was a mass described as in the upper inner quadrant measuring 2.3 cm. Less than a centimeter away from this mass there was a satellite nodule measuring 0.9 cm. In the right axilla there were multiple enlarged right axillary lymph nodes, including a confluent region measuring up to 4 cm. There were also enlarged level II lymph nodes present measuring up to 2.1 cm.  CT scan of the chest w/ contrast on 09/06/15 revealed metastatic adenopathy in the right axilla and sub pectoralis nodal stations, no central metastatic adenopathy, and multiple sub 5 mm noncalcified nodules in the bilateral lungs favoring a non-metastatic process. Bone scan the same day showed no metastatic disease.  The patient then started neoadjuvant chemotherapy consisting of cyclophosphamide and docetaxel every 21 days 4, with OnPro support, 09/12/2015 - 11/14/2015, under Dr. Jana Hakim.  On 09/13/15, biopsy of the inner right breast revealed grade 2 invasive ductal carcinoma (ER 100% +, PR 5% +, HER2 -, Ki67 5%) and DCIS.  Bilateral breast MRI on 11/16/15 showed an overall good response to interval treatment.  On 12/05/15 the patient had a left lumpectomy revealing a complex sclerosing lesion with calcifications, fibroadenoma, and sclerosing adenosis. On the same day the patient had a right lumpectomy revealing a grade 2 (1.5 cm) invasive ductal carcinoma and DCIS (ER 100% +, PR 5% +, HER2 -, Ki67 30%) (pT1c, pN3). Lymphovascular invasion  was identified. Focally the invasive ductal carcinoma and DCIS was within 1 mm from the anterior resection margin (the skin). Axillary  node dissection  of the right axilla revealed 15/17 positive lymph nodes.  The patient presented to Dr. Jana Hakim once again who recommended adjuvant radiation with concurrent capecitabine sensitization. The capecitabine is to be continued for a total 6 months and anti-estrogens for 10 years to follow at the completion of local treatment-- consider PALLAS trial.  The patient presents today to discuss the role of radiation in the management of her disease.  PREVIOUS RADIATION THERAPY: No  PAST MEDICAL HISTORY:  has a past medical history of Arthritis; Asthma; Cancer ([place]); Chronic vaginitis; Fibromyalgia; GERD (gastroesophageal reflux disease); Hypoglycemia; Hypoglycemia; Hypothyroidism; Interstitial cystitis; Menopausal symptoms; Osteoporosis; Pneumonia; PONV (postoperative nausea and vomiting); and Thyroid disease.    PAST SURGICAL HISTORY: Past Surgical History:  Procedure Laterality Date  . BLADDER HYPEREXTENSION     X4  . BREAST LUMPECTOMY WITH NEEDLE LOCALIZATION AND AXILLARY LYMPH NODE DISSECTION Right 12/05/2015   Procedure: RIGHT BREAST NEEDLE LOCALIZED (WIRES X 2) LUMPECTOMY WITH AXILLARY LYMPH NODE DISSECTION;  Surgeon: Fanny Skates, MD;  Location: [place];  Service: General;  Laterality: Right;  . BREAST LUMPECTOMY WITH RADIOACTIVE SEED LOCALIZATION Left 12/05/2015   Procedure: RADIOACTIVE SEED GUIDED LEFT BREAST LUMPECTOMY;  Surgeon: Fanny Skates, MD;  Location: [place];  Service: General;  Laterality: Left;  . BUNIONECTOMY    . [place]     DR. INGRAM  . CHOLECYSTECTOMY  01/2006  . COLONOSCOPY    . DENTAL SURGERY     13 dental implants  . LEG SURGERY  06/2004   Broken tibia  . MOUTH SURGERY    . NASAL SINUS SURGERY     X4  . PORT-A-CATH REMOVAL Right 12/05/2015   Procedure: REMOVAL PORT-A-CATH;  Surgeon: Fanny Skates, MD;  Location: [place];  Service: General;  Laterality: Right;  . PORTACATH PLACEMENT Left 09/08/2015   Procedure: INSERTION OF PORT-A-CATH ;  Surgeon: Fanny Skates, MD;   Location: [place];  Service: General;  Laterality: Left;    FAMILY HISTORY: family history includes Breast cancer in her sister; Cancer in her sister; Heart disease in her mother and sister; Hypertension in her mother; Parkinsonism in her father.  SOCIAL HISTORY:  reports that she has quit smoking. Her smoking use included Cigarettes. She has a 3.75 pack-year smoking history. She has never used smokeless tobacco. She reports that she drinks alcohol. She reports that she does not use drugs.  ALLERGIES: Nitrofurantoin; Penicillins; Chlorhexidine; Fluconazole; and Other  MEDICATIONS:  Current Outpatient Prescriptions  Medication Sig Dispense Refill  . albuterol (PROVENTIL HFA;VENTOLIN HFA) 108 (90 Base) MCG/ACT inhaler Inhale 1-2 puffs into the lungs every 6 (six) hours as needed for wheezing or shortness of breath.    Marland Kitchen buPROPion (WELLBUTRIN XL) 300 MG 24 hr tablet Take 300 mg by mouth daily.      . calcitonin, salmon, (MIACALCIN/FORTICAL) 200 UNIT/ACT nasal spray Place 1 spray into alternate nostrils daily. 3.7 mL 11  . Calcium Carbonate-Vitamin D (CALCIUM + D PO) Take 1 tablet by mouth at bedtime.     . carisoprodol (SOMA) 350 MG tablet Take 350 mg by mouth 2 (two) times daily as needed for muscle spasms.   2  . cephALEXin (KEFLEX) 500 MG capsule Take 1 capsule (500 mg total) by mouth 4 (four) times daily. 40 capsule 0  . cycloSPORINE (RESTASIS) 0.05 % ophthalmic emulsion Place 1 drop into both eyes 2 (  two) times daily. 5.5 mL 0  . diazepam (VALIUM) 5 MG tablet Take 5 mg by mouth every 6 (six) hours as needed for muscle spasms.     Marland Kitchen HYDROcodone-acetaminophen (NORCO) 10-325 MG tablet Take 1 tablet by mouth every 4 (four) hours as needed for moderate pain or severe pain.    . hydrOXYzine (ATARAX/VISTARIL) 25 MG tablet Take 1 tablet (25 mg total) by mouth 3 (three) times daily as needed for anxiety or itching. 30 tablet 0  . levothyroxine (SYNTHROID) 137 MCG tablet Take 137 mcg by mouth daily.       Marland Kitchen lidocaine-prilocaine (EMLA) cream Apply to affected area once 30 g 3  . LORazepam (ATIVAN) 0.5 MG tablet Take 1 tablet (0.5 mg total) by mouth at bedtime as needed (Nausea or vomiting). 30 tablet 0  . Multiple Vitamin (MULTIVITAMIN) capsule Take 1 capsule by mouth daily.      . nabumetone (RELAFEN) 750 MG tablet Take 750 mg by mouth 2 (two) times daily as needed for mild pain.    Marland Kitchen oxyCODONE (OXY IR/ROXICODONE) 5 MG immediate release tablet Take 1-2 tablets (5-10 mg total) by mouth every 6 (six) hours as needed for severe pain. 40 tablet 0  . pantoprazole (PROTONIX) 40 MG tablet Take 40 mg by mouth daily.    . prochlorperazine (COMPAZINE) 10 MG tablet Take 1 tablet (10 mg total) by mouth every 6 (six) hours as needed (Nausea or vomiting). 30 tablet 1  . triamterene-hydrochlorothiazide (DYAZIDE) 37.5-25 MG capsule Take 1 tablet by mouth daily.     . Vitamin D, Ergocalciferol, (DRISDOL) 50000 UNITS CAPS capsule One tablet every other week (Patient taking differently: Take 50,000 Units by mouth every 14 (fourteen) days. One tablet every other week) 10 capsule 3  . zolpidem (AMBIEN) 10 MG tablet Take 10 mg by mouth at bedtime as needed for sleep.     . capecitabine (XELODA) 500 MG tablet Take 3 tablets (1,500 mg total) by mouth 2 (two) times daily after a meal. (Patient not taking: Reported on 12/21/2015) 42 tablet 6  . clotrimazole (MYCELEX) 10 MG troche Take 1 tablet (10 mg total) by mouth 5 (five) times daily. (Patient not taking: Reported on 12/15/2015) 150 tablet 0  . fluconazole (DIFLUCAN) 100 MG tablet Take 1 tablet (100 mg total) by mouth daily. (Patient not taking: Reported on 12/15/2015) 30 tablet 0  . ondansetron (ZOFRAN) 4 MG tablet Take 1 tablet (4 mg total) by mouth every 8 (eight) hours as needed for nausea or vomiting. 20 tablet 3  . Polyethyl Glycol-Propyl Glycol (SYSTANE OP) Place 1 drop into both eyes as needed (for dry eyes).    . valACYclovir (VALTREX) 500 MG tablet Take 1 tablet  (500 mg total) by mouth 2 (two) times daily. (Patient not taking: Reported on 12/15/2015) 60 tablet 1   No current facility-administered medications for this encounter.     REVIEW OF SYSTEMS:  A 15 point review of systems is documented in the electronic medical record. This was obtained by the nursing staff. However, I reviewed this with the patient to discuss relevant findings and make appropriate changes. Pain issues, if any:  yes - has neuropathy in her legs that started after her 2nd cycle of chemotherapy.  She does take Oxycodone for fibromyalgia. She reports significant fatigue at this time   PHYSICAL EXAM:  height is 5' 2"  (1.575 m) and weight is 167 lb (75.8 kg). Her oral temperature is 98.3 F (36.8 C). Her blood pressure is  112/66 and her pulse is 73. Her oxygen saturation is 95%.   General: Alert and oriented, in no acute distress, accompanied by husband on evaluation today HEENT: Head is normocephalic. Extraocular movements are intact. Oropharynx is clear. Neck: Neck is supple, no palpable cervical or supraclavicular lymphadenopathy. Heart: Regular in rate and rhythm with no murmurs, rubs, or gallops. Chest: Clear to auscultation bilaterally, with no rhonchi, wheezes, or rales. Abdomen: Soft, nontender, nondistended, with no rigidity or guarding. Extremities: No cyanosis or edema. Lymphatics: see Neck Exam Skin: No concerning lesions. Musculoskeletal: symmetric strength and muscle tone throughout. Neurologic: Cranial nerves II through XII are grossly intact. No obvious focalities. Speech is fluent. Coordination is intact. Psychiatric: Judgment and insight are intact. Affect is appropriate. Left breast no palpable mass or nipple discharge. Lumpectomy scar which is healing well without signs of infection. Right breast reveals a scar in the approximately 12:00 position healing well without signs of drainage or infection. No dominant mass appreciated in the breast. The patient has a  separate scar in the axillary region from her axillary node dissection which is also healing well without signs of infection or drainage.  ECOG = 1  LABORATORY DATA:  Lab Results  Component Value Date   WBC 16.8 (H) 12/15/2015   HGB 12.1 12/15/2015   HCT 36.0 12/15/2015   MCV 98.6 12/15/2015   PLT 382 12/15/2015   NEUTROABS 14.1 (H) 12/15/2015   Lab Results  Component Value Date   NA 135 12/15/2015   K 3.4 (L) 12/15/2015   CL 101 12/15/2015   CO2 23 12/15/2015   GLUCOSE 111 (H) 12/15/2015   CREATININE 0.83 12/15/2015   CALCIUM 8.6 (L) 12/15/2015      RADIOGRAPHY: Dg Chest 2 View  Result Date: 12/15/2015 CLINICAL DATA:  Breast cancer. Ten days postop for breast surgery. Diffuse body pain and fever. EXAM: CHEST  2 VIEW COMPARISON:  One-view chest x-ray 09/08/2015. FINDINGS: Linear densities at the lung bases bilaterally likely reflect atelectasis. No other significant airspace disease is present. The heart size is normal. The upper lung fields are clear. A surgical drain extends into the right axilla. Surgical clips are present in the right axilla. Surgical clips are present within both breasts. IMPRESSION: 1. Linear densities at the lung bases bilaterally likely reflect atelectasis. 2. No other acute airspace disease. 3. Right axillary drain and surgical clips. 4. Surgical clips in both breasts. Electronically Signed   By: San Morelle M.D.   On: 12/15/2015 21:55   Mm Breast Surgical Specimen  Result Date: 12/05/2015 CLINICAL DATA:  Post right breast lumpectomy. EXAM: SPECIMEN RADIOGRAPH OF THE RIGHT BREAST COMPARISON:  Previous exam(s). FINDINGS: Status post excision of the right breast. The two wire tips and biopsy marker clips are present and are marked for pathology. IMPRESSION: Specimen radiograph of the right breast. Electronically Signed   By: Everlean Alstrom M.D.   On: 12/05/2015 15:22   Mm Breast Surgical Specimen  Result Date: 12/05/2015 CLINICAL DATA:  Post left  breast excision. EXAM: SPECIMEN RADIOGRAPH OF THE LEFT BREAST COMPARISON:  Previous exam(s). FINDINGS: Status post excision of the left breast. The radioactive seed and biopsy marker clip are present, completely intact, and were marked for pathology. IMPRESSION: Specimen radiograph of the left breast. Electronically Signed   By: Everlean Alstrom M.D.   On: 12/05/2015 14:27   Mm Lt Radioactive Seed Loc Mammo Guide  Result Date: 12/05/2015 CLINICAL DATA:  Preoperative localization for removal of left breast lesion. EXAM: MAMMOGRAPHIC GUIDED  RADIOACTIVE SEED LOCALIZATION OF THE LEFT BREAST COMPARISON:  Previous exam(s). FINDINGS: Patient presents for radioactive seed localization prior to surgical excision of left breast lesion. I met with the patient and we discussed the procedure of seed localization including benefits and alternatives. We discussed the high likelihood of a successful procedure. We discussed the risks of the procedure including infection, bleeding, tissue injury and further surgery. We discussed the low dose of radioactivity involved in the procedure. Informed, written consent was given. The usual time-out protocol was performed immediately prior to the procedure. Using mammographic guidance, sterile technique, 1% lidocaine and an I-125 radioactive seed, the X shaped clip was localized using a superior craniocaudal approach. The follow-up mammogram images confirm the seed in the expected location and were marked for Dr. Dalbert Batman. Follow-up survey of the patient confirms presence of the radioactive seed. Order number of I-125 seed:  [id number]. Total activity:  8.563 millicuries  Reference Date: 11/25/2015 The patient tolerated the procedure well and was released from the [facility]. She was given instructions regarding seed removal. IMPRESSION: Radioactive seed localization left breast. No apparent complications. Electronically Signed   By: Altamese Cabal M.D.   On: 12/05/2015 11:47   Mm Rt  Plc Breast Loc Dev   1st Lesion  Inc Mammo Guide  Result Date: 12/05/2015 CLINICAL DATA:  Preoperative needle localization for removal of right breast malignancy. EXAM: NEEDLE LOCALIZATION OF THE RIGHT BREAST WITH MAMMO GUIDANCE COMPARISON:  Previous exams. FINDINGS: Patient presents for needle localization prior to surgical excision of right breast malignancy. I met with the patient and we discussed the procedure of needle localization including benefits and alternatives. We discussed the high likelihood of a successful procedure. We discussed the risks of the procedure, including infection, bleeding, tissue injury, and further surgery. Informed, written consent was given. The usual time-out protocol was performed immediately prior to the procedure. Using mammographic guidance, sterile technique, 1% lidocaine and two 5 cm modified Kopans needles, the ribbon shaped clip and hourglass shaped clip are localized using superior craniocaudal approach. The images were marked for Dr. Dalbert Batman. IMPRESSION: Needle localization right breast. No apparent complications. Electronically Signed   By: Altamese Cabal M.D.   On: 12/05/2015 11:45   Mm Rt Plc Breast Loc Dev   Ea Add Lesion  Inc Mammo Guide  Result Date: 12/05/2015 CLINICAL DATA:  Preoperative needle localization for removal of right breast malignancy. EXAM: NEEDLE LOCALIZATION OF THE RIGHT BREAST WITH MAMMO GUIDANCE COMPARISON:  Previous exams. FINDINGS: Patient presents for needle localization prior to surgical excision of right breast malignancy. I met with the patient and we discussed the procedure of needle localization including benefits and alternatives. We discussed the high likelihood of a successful procedure. We discussed the risks of the procedure, including infection, bleeding, tissue injury, and further surgery. Informed, written consent was given. The usual time-out protocol was performed immediately prior to the procedure. Using mammographic  guidance, sterile technique, 1% lidocaine and two 5 cm modified Kopans needles, the ribbon shaped clip and hourglass shaped clip are localized using superior craniocaudal approach. The images were marked for Dr. Dalbert Batman. IMPRESSION: Needle localization right breast. No apparent complications. Electronically Signed   By: Altamese Cabal M.D.   On: 12/05/2015 11:45      IMPRESSION: Stage IIIC invasive ductal carcinoma (pT1c, pN3) of the right breast (ER/PR+, HER2 negative) status post lumpectomy and neoadjuvant chemotherapy. Patient will be a good candidate for breast conservation therapy with radiation therapy directed at the right breast. I  would also recommend elective coverage of the  Axillary/ supraclavicular region with her breast radiation treatment given the findings of her axillary node dissection showing 15 positive nodes along with extracapsular extension. I discussed the course of treatment side effects and potential long-term toxicities of radiation therapy in this situation with the patient and her husband. She appears to understand and wishes to proceed with planned course of treatment.   PLAN: Patient will be set up for simulation and planning approximately 5 weeks postop with treatments to begin 6 weeks postop along with capecitabine sensitization .     ------------------------------------------------  Blair Promise, PhD, MD  This document serves as a record of services personally performed by Gery Pray, MD. It was created on his behalf by Darcus Austin, a trained medical scribe. The creation of this record is based on the scribe's personal observations and the provider's statements to them. This document has been checked and approved by the attending provider.

## 2015-12-21 ENCOUNTER — Encounter: Payer: Self-pay | Admitting: Radiation Oncology

## 2015-12-21 ENCOUNTER — Encounter: Payer: Self-pay | Admitting: Pharmacist

## 2015-12-21 ENCOUNTER — Ambulatory Visit
Admission: RE | Admit: 2015-12-21 | Discharge: 2015-12-21 | Disposition: A | Discharge status: Home / Self Care | Payer: Medicare Other | Source: Ambulatory Visit | Attending: Radiation Oncology | Admitting: Radiation Oncology

## 2015-12-21 ENCOUNTER — Telehealth: Payer: Self-pay | Admitting: *Deleted

## 2015-12-21 ENCOUNTER — Other Ambulatory Visit: Payer: Self-pay | Admitting: *Deleted

## 2015-12-21 VITALS — BP 112/66 | HR 73 | Temp 98.3°F | Ht 62.0 in | Wt 167.0 lb

## 2015-12-21 DIAGNOSIS — E039 Hypothyroidism, unspecified: Secondary | ICD-10-CM | POA: Insufficient documentation

## 2015-12-21 DIAGNOSIS — Z803 Family history of malignant neoplasm of breast: Secondary | ICD-10-CM | POA: Diagnosis not present

## 2015-12-21 DIAGNOSIS — M797 Fibromyalgia: Secondary | ICD-10-CM | POA: Insufficient documentation

## 2015-12-21 DIAGNOSIS — Z79899 Other long term (current) drug therapy: Secondary | ICD-10-CM | POA: Insufficient documentation

## 2015-12-21 DIAGNOSIS — Z170 Estrogen receptor positive status [ER+]: Secondary | ICD-10-CM | POA: Diagnosis not present

## 2015-12-21 DIAGNOSIS — Z8249 Family history of ischemic heart disease and other diseases of the circulatory system: Secondary | ICD-10-CM | POA: Diagnosis not present

## 2015-12-21 DIAGNOSIS — Z510 Encounter for antineoplastic radiation therapy: Secondary | ICD-10-CM | POA: Diagnosis not present

## 2015-12-21 DIAGNOSIS — Z87891 Personal history of nicotine dependence: Secondary | ICD-10-CM | POA: Insufficient documentation

## 2015-12-21 DIAGNOSIS — K219 Gastro-esophageal reflux disease without esophagitis: Secondary | ICD-10-CM | POA: Diagnosis not present

## 2015-12-21 DIAGNOSIS — M810 Age-related osteoporosis without current pathological fracture: Secondary | ICD-10-CM | POA: Insufficient documentation

## 2015-12-21 DIAGNOSIS — Z888 Allergy status to other drugs, medicaments and biological substances status: Secondary | ICD-10-CM | POA: Diagnosis not present

## 2015-12-21 DIAGNOSIS — J45909 Unspecified asthma, uncomplicated: Secondary | ICD-10-CM | POA: Diagnosis not present

## 2015-12-21 DIAGNOSIS — Z880 Allergy status to penicillin: Secondary | ICD-10-CM | POA: Insufficient documentation

## 2015-12-21 DIAGNOSIS — C773 Secondary and unspecified malignant neoplasm of axilla and upper limb lymph nodes: Secondary | ICD-10-CM | POA: Diagnosis not present

## 2015-12-21 DIAGNOSIS — C50411 Malignant neoplasm of upper-outer quadrant of right female breast: Secondary | ICD-10-CM | POA: Diagnosis not present

## 2015-12-21 MED ORDER — ONDANSETRON HCL 4 MG PO TABS
4.0000 mg | ORAL_TABLET | Freq: Three times a day (TID) | ORAL | 3 refills | Status: DC | PRN
Start: 2015-12-21 — End: 2016-01-30

## 2015-12-21 NOTE — Progress Notes (Signed)
Oral Chemotherapy Pharmacist Encounter   Received notification for [facility] that patient's Xeloda prescription could be filled there with a $0 co-pay. They will fill medication and call patient when it is ready.  Pt has an extensive medication list. List was surveyed for DDI's with Xeloda. Minor interaction with pantoprazole and Xeloda, no other clinically relevant DDI's with Xeloda identified.  Pt will take Xeloda with XRT. Oral Chemo Clinic will follow-up with patient closer to start of treatment for initial counseling.  Oral Chemo Clinic will continue to follow for plan.  Johny Drilling, PharmD, BCPS Oral Chemotherapy Clinic

## 2015-12-21 NOTE — Progress Notes (Signed)
Please see the Nurse Progress Note in the MD Initial Consult Encounter for this patient. 

## 2015-12-21 NOTE — Telephone Encounter (Signed)
This RN spoke with pt per VM from [name] with Dr Sondra Come.  Informed pt - refill for zofran sent to her pharmacy for use if needed when she starts radiation and is taking xeloda.  Discussed nausea not a common side effect of xeloda but good to have the zofran available if needed.  Pt verbalized understanding.

## 2015-12-28 ENCOUNTER — Ambulatory Visit (HOSPITAL_BASED_OUTPATIENT_CLINIC_OR_DEPARTMENT_OTHER): Payer: Medicare Other | Admitting: Oncology

## 2015-12-28 ENCOUNTER — Ambulatory Visit: Payer: Medicare Other

## 2015-12-28 ENCOUNTER — Ambulatory Visit: Payer: Medicare Other | Admitting: Radiation Oncology

## 2015-12-28 VITALS — BP 140/70 | HR 85 | Temp 98.2°F | Resp 18 | Ht 62.0 in | Wt 164.6 lb

## 2015-12-28 DIAGNOSIS — Z170 Estrogen receptor positive status [ER+]: Secondary | ICD-10-CM | POA: Diagnosis not present

## 2015-12-28 DIAGNOSIS — Z803 Family history of malignant neoplasm of breast: Secondary | ICD-10-CM | POA: Diagnosis not present

## 2015-12-28 DIAGNOSIS — C773 Secondary and unspecified malignant neoplasm of axilla and upper limb lymph nodes: Secondary | ICD-10-CM | POA: Diagnosis not present

## 2015-12-28 DIAGNOSIS — L659 Nonscarring hair loss, unspecified: Secondary | ICD-10-CM

## 2015-12-28 DIAGNOSIS — R531 Weakness: Secondary | ICD-10-CM | POA: Diagnosis not present

## 2015-12-28 DIAGNOSIS — C50411 Malignant neoplasm of upper-outer quadrant of right female breast: Secondary | ICD-10-CM

## 2015-12-28 DIAGNOSIS — Z808 Family history of malignant neoplasm of other organs or systems: Secondary | ICD-10-CM | POA: Diagnosis not present

## 2015-12-28 NOTE — Progress Notes (Signed)
[name]  [phone] [phone]     ID: BRAYDEE SHIMKUS DOB: August 04, 1945  MR#: [id number]  KGY#:[id number]  Patient Care Team: Seward Carol, MD as PCP - General (Internal Medicine) Chauncey Cruel, MD as Consulting Physician (Oncology) Fanny Skates, MD as Consulting Physician (General Surgery) Luberta Robertson, MD (Radiology) Hennie Duos, MD as Consulting Physician (Rheumatology) Domingo Pulse, MD (Urology) Druscilla Brownie, MD as Consulting Physician (Dermatology) Luberta Mutter, MD as Consulting Physician (Ophthalmology) OTHER MD:  CHIEF COMPLAINT: Locally advanced breast cancer  CURRENT TREATMENT:  adjuvant radiation with capecitabine sensitization  BREAST CANCER HISTORY: From the original intake note:  [name] had routine mammographic screening in 08/18/2015 at the [facility], showing a right breast mass and possible axillary adenopathy. On 08/23/2015 she underwent bilateral diagnostic mammography with tomography and bilateral breast ultrasonography. The breast density was category B in the right breast there was a spiculated mass in the upper-outer quadrant measuring 1.2 cm and associated with pleomorphic calcifications. There were calcifications extending anteriorly and posteriorly from the mass worrisome for ductal carcinoma in situ. The total area in question is about 3 cm. There were also multiple enlarged right axillary lymph nodes. The mass was palpated as an area of fullness at the 12:00 position of the breast 3 cm from the nipple. There were palpable enlarged right axillary lymph nodes in the right axilla. Ultrasonography of the right breast confirmed an irregular hypoechoic mass in the area in question measuring 1.6 cm. There were multiple abnormal appearing enlarged right axillary lymph nodes, the largest measuring 2.3 cm.  In the left breast, mammography showed an area of architectural distortion associated with the left  nipple. This was not palpable. Ultrasonography of the left breast found no correlate. This area of the left breast was biopsied under tomography 08/24/2015. This showed (SAA [id number]) a complex sclerosing lesion and fibroadenoma.  On 08/23/2015 the patient underwent biopsy of the right breast mass in 1 of the abnormal right axillary lymph nodes. 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 Both biopsies were HER-2 nonamplified, the breast being ratio 1.27 and number per cell 1.90, and the lymph node 1 ratio 1.24 and number per cell 3.0.  On 08/26/2015 the patient underwent bilateral breast MRI. In the left breast there was no suspicious enhancement. The left axilla was benign. In the right breast there was a mass described as in the upper inner quadrant measuring 2.3 cm. Less than a centimeter away from this mass there was a satellite nodule measuring 0.9 cm. In the right axilla there were multiple enlarged right axillary lymph nodes, including a confluent region measuring up to 4 cm. There were also enlarged level II lymph nodes present measuring up to 2.1 cm.  The patient's subsequent history is as detailed below  INTERVAL HISTORY: [name] returns today for follow up of her stage IIIc breast cancer, accompanied by her husband [name]. After her last visit here she finally had her drain removed and that was a relief. She still has significant right axillary pain and also pain in the medial aspect of the right upper extremity. Thankfully she has not had any significant swelling of the right arm. She has a very good range of motion. She also has pain in the right breast, of course.  REVIEW OF SYSTEMS: Jeani Hawking is still has tearing, but she  is going to be seeing her ophthalmologist next month and may need recannulization of her tear ducts. Some of her  fingernails are coming loose and she may lose some. Her eyebrows also have not returned. She still has not grown any hair on top of her head. I reassured her that I have not yet had anyone not regrowing her hair after Taxotere, although that has been reported. Aside from the lingering postoperative discomfort, the rest of the detailed review of systems today was stable.  PAST MEDICAL HISTORY: Past Medical History:  Diagnosis Date  . Arthritis   . Asthma   . Cancer [facility])    breast cancer  . Chronic vaginitis   . Fibromyalgia   . GERD (gastroesophageal reflux disease)   . Hypoglycemia   . Hypoglycemia   . Hypothyroidism   . Interstitial cystitis   . Menopausal symptoms   . Osteoporosis   . Pneumonia   . PONV (postoperative nausea and vomiting)   . Thyroid disease    hypothyroid    PAST SURGICAL HISTORY: Past Surgical History:  Procedure Laterality Date  . BLADDER HYPEREXTENSION     X4  . BREAST LUMPECTOMY WITH NEEDLE LOCALIZATION AND AXILLARY LYMPH NODE DISSECTION Right 12/05/2015   Procedure: RIGHT BREAST NEEDLE LOCALIZED (WIRES X 2) LUMPECTOMY WITH AXILLARY LYMPH NODE DISSECTION;  Surgeon: Fanny Skates, MD;  Location: [place];  Service: General;  Laterality: Right;  . BREAST LUMPECTOMY WITH RADIOACTIVE SEED LOCALIZATION Left 12/05/2015   Procedure: RADIOACTIVE SEED GUIDED LEFT BREAST LUMPECTOMY;  Surgeon: Fanny Skates, MD;  Location: [place];  Service: General;  Laterality: Left;  . BUNIONECTOMY    . [place]     DR. INGRAM  . CHOLECYSTECTOMY  01/2006  . COLONOSCOPY    . DENTAL SURGERY     13 dental implants  . LEG SURGERY  06/2004   Broken tibia  . MOUTH SURGERY    . NASAL SINUS SURGERY     X4  . PORT-A-CATH REMOVAL Right 12/05/2015   Procedure: REMOVAL PORT-A-CATH;  Surgeon: Fanny Skates, MD;  Location: [place];  Service: General;  Laterality: Right;  . PORTACATH PLACEMENT Left 09/08/2015   Procedure: INSERTION OF PORT-A-CATH ;  Surgeon: Fanny Skates, MD;   Location: [facility];  Service: General;  Laterality: Left;    FAMILY HISTORY Family History  Problem Relation Age of Onset  . Hypertension Mother   . Heart disease Mother   . Parkinsonism Father   . Heart disease Sister   . Cancer Sister     MELANOMA  . Breast cancer Sister     Age 52  The patient's father died at age 28 from complications of Parkinson's disease. The patient's mother died age 3 with heart disease. Tanashia had no brothers. She had 2 sisters. One sister was diagnosed with breast cancer at age 77. She was tested for the BRCA gene and was negative. The same sister also had melanoma diagnosed in her 83s. The patient has one maternal great aunt diagnosed with breast cancer around age 45. There is no history of ovarian cancer in the family  GYNECOLOGIC HISTORY:  No LMP recorded. Patient is postmenopausal. Menarche age 49, first live birth age 40. The patient is GX P2. She stopped having periods in her early 21s and took hormone replacement until her breast cancer diagnosis, April 2017.  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 [name] has  a history of non-Hodgkin's lymphoma and is status post first auto and then allo transplants. He has been in remission for more than 10 years. Their daughter Nira Conn lives in [place] were she is Production designer, theatre/television/film of the [employer]. Daughter Belenda Cruise is an infectious disease M.D. working and teaching at [employer] in [place]. The patient has one granddaughter, 74 months old as of May 2017. The family attends [employer]: Not in place   HEALTH MAINTENANCE: Social History  Substance Use Topics  . Smoking status: Former Smoker    Packs/day: 0.25    Years: 15.00    Types: Cigarettes  . Smokeless tobacco: Never Used  . Alcohol use 0.0 oz/week     Comment: social     Colonoscopy: 2016/ [facility]  PAP: 2015  Bone  density:   Lipid panel:  Allergies  Allergen Reactions  . Nitrofurantoin Other (See Comments)    Unknown reaction  . Penicillins Other (See Comments)    Has patient had a PCN reaction causing immediate rash, facial/tongue/throat swelling, SOB or lightheadedness with hypotension: No Has patient had a PCN reaction causing severe rash involving mucus membranes or skin necrosis: No Has patient had a PCN reaction that required hospitalization No Has patient had a PCN reaction occurring within the last 10 years: No If all of the above answers are "NO", then may proceed with Cephalosporin use.  . Chlorhexidine Other (See Comments)    Matheny IS OK. The CLOTH WIPE causes reaction. Causes itching all over the area  . Fluconazole Other (See Comments)    REACTION: elavated LFTs - only happened once  . Other Rash    Adhesive tape    Current Outpatient Prescriptions  Medication Sig Dispense Refill  . albuterol (PROVENTIL HFA;VENTOLIN HFA) 108 (90 Base) MCG/ACT inhaler Inhale 1-2 puffs into the lungs every 6 (six) hours as needed for wheezing or shortness of breath.    Marland Kitchen buPROPion (WELLBUTRIN XL) 300 MG 24 hr tablet Take 300 mg by mouth daily.      . calcitonin, salmon, (MIACALCIN/FORTICAL) 200 UNIT/ACT nasal spray Place 1 spray into alternate nostrils daily. 3.7 mL 11  . Calcium Carbonate-Vitamin D (CALCIUM + D PO) Take 1 tablet by mouth at bedtime.     . capecitabine (XELODA) 500 MG tablet Take 3 tablets (1,500 mg total) by mouth 2 (two) times daily after a meal. (Patient not taking: Reported on 12/21/2015) 42 tablet 6  . carisoprodol (SOMA) 350 MG tablet Take 350 mg by mouth 2 (two) times daily as needed for muscle spasms.   2  . cephALEXin (KEFLEX) 500 MG capsule Take 1 capsule (500 mg total) by mouth 4 (four) times daily. 40 capsule 0  . clotrimazole (MYCELEX) 10 MG troche Take 1 tablet (10 mg total) by mouth 5 (five) times daily. (Patient not taking: Reported on 12/15/2015) 150 tablet 0  .  cycloSPORINE (RESTASIS) 0.05 % ophthalmic emulsion Place 1 drop into both eyes 2 (two) times daily. 5.5 mL 0  . diazepam (VALIUM) 5 MG tablet Take 5 mg by mouth every 6 (six) hours as needed for muscle spasms.     . fluconazole (DIFLUCAN) 100 MG tablet Take 1 tablet (100 mg total) by mouth daily. (Patient not taking: Reported on 12/15/2015) 30 tablet 0  . HYDROcodone-acetaminophen (NORCO) 10-325 MG tablet Take 1 tablet by mouth every 4 (four) hours as needed for moderate pain or severe pain.    . hydrOXYzine (ATARAX/VISTARIL) 25 MG tablet Take  1 tablet (25 mg total) by mouth 3 (three) times daily as needed for anxiety or itching. 30 tablet 0  . levothyroxine (SYNTHROID) 137 MCG tablet Take 137 mcg by mouth daily.     Marland Kitchen lidocaine-prilocaine (EMLA) cream Apply to affected area once 30 g 3  . LORazepam (ATIVAN) 0.5 MG tablet Take 1 tablet (0.5 mg total) by mouth at bedtime as needed (Nausea or vomiting). 30 tablet 0  . Multiple Vitamin (MULTIVITAMIN) capsule Take 1 capsule by mouth daily.      . nabumetone (RELAFEN) 750 MG tablet Take 750 mg by mouth 2 (two) times daily as needed for mild pain.    Marland Kitchen ondansetron (ZOFRAN) 4 MG tablet Take 1 tablet (4 mg total) by mouth every 8 (eight) hours as needed for nausea or vomiting. 20 tablet 3  . oxyCODONE (OXY IR/ROXICODONE) 5 MG immediate release tablet Take 1-2 tablets (5-10 mg total) by mouth every 6 (six) hours as needed for severe pain. 40 tablet 0  . pantoprazole (PROTONIX) 40 MG tablet Take 40 mg by mouth daily.    Vladimir Faster Glycol-Propyl Glycol (SYSTANE OP) Place 1 drop into both eyes as needed (for dry eyes).    . prochlorperazine (COMPAZINE) 10 MG tablet Take 1 tablet (10 mg total) by mouth every 6 (six) hours as needed (Nausea or vomiting). 30 tablet 1  . triamterene-hydrochlorothiazide (DYAZIDE) 37.5-25 MG capsule Take 1 tablet by mouth daily.     . valACYclovir (VALTREX) 500 MG tablet Take 1 tablet (500 mg total) by mouth 2 (two) times daily.  (Patient not taking: Reported on 12/15/2015) 60 tablet 1  . Vitamin D, Ergocalciferol, (DRISDOL) 50000 UNITS CAPS capsule One tablet every other week (Patient taking differently: Take 50,000 Units by mouth every 14 (fourteen) days. One tablet every other week) 10 capsule 3  . zolpidem (AMBIEN) 10 MG tablet Take 10 mg by mouth at bedtime as needed for sleep.      No current facility-administered medications for this visit.     OBJECTIVE: Middle-aged white woman Who appears stated age 83:   12/28/15 1701  BP: 140/70  Pulse: 85  Resp: 18  Temp: 98.2 F (36.8 C)     Body mass index is 30.11 kg/m.    ECOG FS:1 - Symptomatic but completely ambulatory  Sclerae unicteric, EOMs intact, epiphora appears reduced Oropharynx clear and moist No cervical or supraclavicular adenopathy Lungs no rales or rhonchi Heart regular rate and rhythm Abd soft, nontender, positive bowel sounds MSK no focal spinal tenderness, no upper extremity lymphedema Neuro: nonfocal, well oriented, appropriate affect Breasts: The right breast is status post lumpectomy. There are no residual masses. It has healed nicely. In the inferior mammary fold there is an erythematous rash consistent with a candidal infection. The right axilla is benign. The left breast is unremarkable. Yes    LAB RESULTS:  CMP     Component Value Date/Time   NA 135 12/15/2015 2044   NA 134 (L) 11/21/2015 1527   K 3.4 (L) 12/15/2015 2044   K 4.8 11/21/2015 1527   CL 101 12/15/2015 2044   CO2 23 12/15/2015 2044   CO2 26 11/21/2015 1527   GLUCOSE 111 (H) 12/15/2015 2044   GLUCOSE 112 11/21/2015 1527   BUN 10 12/15/2015 2044   BUN 10.3 11/21/2015 1527   CREATININE 0.83 12/15/2015 2044   CREATININE 0.8 11/21/2015 1527   CALCIUM 8.6 (L) 12/15/2015 2044   CALCIUM 9.1 11/21/2015 1527   PROT 6.6 12/15/2015 2044  PROT 6.3 (L) 11/21/2015 1527   ALBUMIN 3.6 12/15/2015 2044   ALBUMIN 3.2 (L) 11/21/2015 1527   AST 20 12/15/2015 2044   AST  13 11/21/2015 1527   ALT 12 (L) 12/15/2015 2044   ALT 14 11/21/2015 1527   ALKPHOS 67 12/15/2015 2044   ALKPHOS 74 11/21/2015 1527   BILITOT 1.0 12/15/2015 2044   BILITOT 0.52 11/21/2015 1527   GFRNONAA >60 12/15/2015 2044   GFRAA >60 12/15/2015 2044    INo results found for: SPEP, UPEP  Lab Results  Component Value Date   WBC 16.8 (H) 12/15/2015   NEUTROABS 14.1 (H) 12/15/2015   HGB 12.1 12/15/2015   HCT 36.0 12/15/2015   MCV 98.6 12/15/2015   PLT 382 12/15/2015      Chemistry      Component Value Date/Time   NA 135 12/15/2015 2044   NA 134 (L) 11/21/2015 1527   K 3.4 (L) 12/15/2015 2044   K 4.8 11/21/2015 1527   CL 101 12/15/2015 2044   CO2 23 12/15/2015 2044   CO2 26 11/21/2015 1527   BUN 10 12/15/2015 2044   BUN 10.3 11/21/2015 1527   CREATININE 0.83 12/15/2015 2044   CREATININE 0.8 11/21/2015 1527      Component Value Date/Time   CALCIUM 8.6 (L) 12/15/2015 2044   CALCIUM 9.1 11/21/2015 1527   ALKPHOS 67 12/15/2015 2044   ALKPHOS 74 11/21/2015 1527   AST 20 12/15/2015 2044   AST 13 11/21/2015 1527   ALT 12 (L) 12/15/2015 2044   ALT 14 11/21/2015 1527   BILITOT 1.0 12/15/2015 2044   BILITOT 0.52 11/21/2015 1527       No results found for: LABCA2  No components found for: LABCA125  No results for input(s): INR in the last 168 hours.  Urinalysis    Component Value Date/Time   COLORURINE YELLOW 12/15/2015 2208   APPEARANCEUR CLEAR 12/15/2015 2208   LABSPEC 1.014 12/15/2015 2208   PHURINE 6.5 12/15/2015 2208   GLUCOSEU NEGATIVE 12/15/2015 2208   HGBUR NEGATIVE 12/15/2015 2208   BILIRUBINUR NEGATIVE 12/15/2015 2208   KETONESUR NEGATIVE 12/15/2015 2208   PROTEINUR NEGATIVE 12/15/2015 2208   NITRITE NEGATIVE 12/15/2015 2208   LEUKOCYTESUR NEGATIVE 12/15/2015 2208      ELIGIBLE FOR AVAILABLE RESEARCH PROTOCOL: PALLAS  STUDIES: Dg Chest 2 View  Result Date: 12/15/2015 CLINICAL DATA:  Breast cancer. Ten days postop for breast surgery. Diffuse  body pain and fever. EXAM: CHEST  2 VIEW COMPARISON:  One-view chest x-ray 09/08/2015. FINDINGS: Linear densities at the lung bases bilaterally likely reflect atelectasis. No other significant airspace disease is present. The heart size is normal. The upper lung fields are clear. A surgical drain extends into the right axilla. Surgical clips are present in the right axilla. Surgical clips are present within both breasts. IMPRESSION: 1. Linear densities at the lung bases bilaterally likely reflect atelectasis. 2. No other acute airspace disease. 3. Right axillary drain and surgical clips. 4. Surgical clips in both breasts. Electronically Signed   By: San Morelle M.D.   On: 12/15/2015 21:55   Mm Breast Surgical Specimen  Result Date: 12/05/2015 CLINICAL DATA:  Post right breast lumpectomy. EXAM: SPECIMEN RADIOGRAPH OF THE RIGHT BREAST COMPARISON:  Previous exam(s). FINDINGS: Status post excision of the right breast. The two wire tips and biopsy marker clips are present and are marked for pathology. IMPRESSION: Specimen radiograph of the right breast. Electronically Signed   By: Everlean Alstrom M.D.   On: 12/05/2015 15:22  Mm Breast Surgical Specimen  Result Date: 12/05/2015 CLINICAL DATA:  Post left breast excision. EXAM: SPECIMEN RADIOGRAPH OF THE LEFT BREAST COMPARISON:  Previous exam(s). FINDINGS: Status post excision of the left breast. The radioactive seed and biopsy marker clip are present, completely intact, and were marked for pathology. IMPRESSION: Specimen radiograph of the left breast. Electronically Signed   By: Everlean Alstrom M.D.   On: 12/05/2015 14:27   Mm Lt Radioactive Seed Loc Mammo Guide  Result Date: 12/05/2015 CLINICAL DATA:  Preoperative localization for removal of left breast lesion. EXAM: MAMMOGRAPHIC GUIDED RADIOACTIVE SEED LOCALIZATION OF THE LEFT BREAST COMPARISON:  Previous exam(s). FINDINGS: Patient presents for radioactive seed localization prior to surgical excision  of left breast lesion. I met with the patient and we discussed the procedure of seed localization including benefits and alternatives. We discussed the high likelihood of a successful procedure. We discussed the risks of the procedure including infection, bleeding, tissue injury and further surgery. We discussed the low dose of radioactivity involved in the procedure. Informed, written consent was given. The usual time-out protocol was performed immediately prior to the procedure. Using mammographic guidance, sterile technique, 1% lidocaine and an I-125 radioactive seed, the X shaped clip was localized using a superior craniocaudal approach. The follow-up mammogram images confirm the seed in the expected location and were marked for Dr. Dalbert Batman. Follow-up survey of the patient confirms presence of the radioactive seed. Order number of I-125 seed:  [id number]. Total activity:  7.622 millicuries  Reference Date: 11/25/2015 The patient tolerated the procedure well and was released from the [facility]. She was given instructions regarding seed removal. IMPRESSION: Radioactive seed localization left breast. No apparent complications. Electronically Signed   By: Altamese Cabal M.D.   On: 12/05/2015 11:47   Mm Rt Plc Breast Loc Dev   1st Lesion  Inc Mammo Guide  Result Date: 12/05/2015 CLINICAL DATA:  Preoperative needle localization for removal of right breast malignancy. EXAM: NEEDLE LOCALIZATION OF THE RIGHT BREAST WITH MAMMO GUIDANCE COMPARISON:  Previous exams. FINDINGS: Patient presents for needle localization prior to surgical excision of right breast malignancy. I met with the patient and we discussed the procedure of needle localization including benefits and alternatives. We discussed the high likelihood of a successful procedure. We discussed the risks of the procedure, including infection, bleeding, tissue injury, and further surgery. Informed, written consent was given. The usual time-out protocol was  performed immediately prior to the procedure. Using mammographic guidance, sterile technique, 1% lidocaine and two 5 cm modified Kopans needles, the ribbon shaped clip and hourglass shaped clip are localized using superior craniocaudal approach. The images were marked for Dr. Dalbert Batman. IMPRESSION: Needle localization right breast. No apparent complications. Electronically Signed   By: Altamese Cabal M.D.   On: 12/05/2015 11:45   Mm Rt Plc Breast Loc Dev   Ea Add Lesion  Inc Mammo Guide  Result Date: 12/05/2015 CLINICAL DATA:  Preoperative needle localization for removal of right breast malignancy. EXAM: NEEDLE LOCALIZATION OF THE RIGHT BREAST WITH MAMMO GUIDANCE COMPARISON:  Previous exams. FINDINGS: Patient presents for needle localization prior to surgical excision of right breast malignancy. I met with the patient and we discussed the procedure of needle localization including benefits and alternatives. We discussed the high likelihood of a successful procedure. We discussed the risks of the procedure, including infection, bleeding, tissue injury, and further surgery. Informed, written consent was given. The usual time-out protocol was performed immediately prior to the procedure. Using mammographic guidance, sterile  technique, 1% lidocaine and two 5 cm modified Kopans needles, the ribbon shaped clip and hourglass shaped clip are localized using superior craniocaudal approach. The images were marked for Dr. Dalbert Batman. IMPRESSION: Needle localization right breast. No apparent complications. Electronically Signed   By: Altamese Cabal M.D.   On: 12/05/2015 11:45    ASSESSMENT: 70 y.o. [place] woman status post right breast upper outer quadrant and right axillary lymph node biopsy 08/23/2015, both positive for a clinical T1 N2, stage IIIA invasive ductal carcinoma, grade 2, strongly estrogen receptor positive, 5% progesterone receptor positive, with an MIB-120-30% and no HER-2 amplification  (a) biopsy of  a 0.9 cm Right breast satellite nodule 09/13/2015 showed invasive ductal carcinoma, grade 2, E-cadherin strongly positive, with a prognostic panel identical to the larger right upper quadrant mass  (1) left breast biopsy 08/24/2015 shows a complex sclerosing lesion, excised 12/05/2015  (2) neoadjuvant chemotherapy consisting of cyclophosphamide and docetaxel every 21 days 4, with OnPro support, started 09/12/2015, completed 11/14/2015  (3) status post right lumpectomy and sentinel lymph node dissection 12/05/2015 for a residual pT2 pN3, stage IIIC invasive ductal carcinoma, grade 2, with a close anterior margin (skin)  (4) adjuvant radiation to follow with concurrent capecitabine sensitization  (5) capecitabine to be continued for total of 6 months adjuvantly  (6) anti-estrogens for 10 years to follow at the completion of local treatment-- consider PALLAS trial  (7) 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 All of this is going to improve, but it is going to be slow. I would think that in another 6 weeks or so she will be feeling a little bit closer to her baseline but it probably will be a full year before she has quite recovered. She may need a special procedure to clear the tear ducts, which have not yet opened up.   She needs genetics appointment, and that has been entered. She had appointment next week with me which has been canceled since we did all the discussion today.  We went over the possible side effects, toxicities, and complications of capecitabine as well as cost issues. I suggested they go ahead and pick up the drug now so if there is going to be a cause problem we can begin to address it.  She will initially take 2 tablets twice daily after a meal only on radiation  days. When she completes the radiation of course we will go to full dose.  She knows to call for any problems that may develop before her next visit, which will be early October.Marland Kitchen  Chauncey Cruel, MD  12/28/2015

## 2016-01-04 ENCOUNTER — Other Ambulatory Visit: Payer: Medicare Other

## 2016-01-04 ENCOUNTER — Ambulatory Visit: Payer: Medicare Other | Admitting: Oncology

## 2016-01-09 MED FILL — XELODA 500 MG TABLET: 500 | 7 days supply | Qty: 42 | Fill #0

## 2016-01-10 ENCOUNTER — Ambulatory Visit
Admission: RE | Admit: 2016-01-10 | Discharge: 2016-01-10 | Disposition: A | Discharge status: Home / Self Care | Payer: Medicare Other | Source: Ambulatory Visit | Attending: Radiation Oncology | Admitting: Radiation Oncology

## 2016-01-10 ENCOUNTER — Telehealth: Payer: Self-pay | Admitting: Oncology

## 2016-01-10 DIAGNOSIS — Z510 Encounter for antineoplastic radiation therapy: Secondary | ICD-10-CM | POA: Diagnosis not present

## 2016-01-10 DIAGNOSIS — C50411 Malignant neoplasm of upper-outer quadrant of right female breast: Secondary | ICD-10-CM

## 2016-01-10 MED ORDER — FLUCONAZOLE 100 MG PO TABS: 100.0000 mg | ORAL_TABLET | Freq: Every day | ORAL | 0 refills | Status: DC

## 2016-01-10 NOTE — Progress Notes (Signed)
  Radiation Oncology         [phone]) [phone] ________________________________  Name: [name] MRN: [id number]  Date: 01/10/2016  DOB: May 07, 1945  SIMULATION AND TREATMENT PLANNING NOTE    ICD-9-CM ICD-10-CM   1. Breast cancer of upper-outer quadrant of right female breast ([place]) 174.4 C50.411     DIAGNOSIS:  Stage IIIC invasive ductal carcinoma (pT1c, pN3) of the right breast (ER/PR+, HER2 negative) status post lumpectomy and neoadjuvant chemotherapy  NARRATIVE:  The patient was brought to the [facility].  Identity was confirmed.  All relevant records and images related to the planned course of therapy were reviewed.  The patient freely provided informed written consent to proceed with treatment after reviewing the details related to the planned course of therapy. The consent form was witnessed and verified by the simulation staff.  Then, the patient was set-up in a stable reproducible  supine position for radiation therapy.  CT images were obtained.  Surface markings were placed.  The CT images were loaded into the planning software.  Then the target and avoidance structures were contoured.  Treatment planning then occurred.  The radiation prescription was entered and confirmed.  Then, I designed and supervised the construction of a total of 5 medically necessary complex treatment devices.  I have requested : 3D Simulation  I have requested a DVH of the following structures: Heart lungs, lumpectomy cavity.  I have ordered:dose calc.  PLAN:  The patient will receive 45 Gy in 25 fractions using a 4 field set up encompassing the right breast axillary and supraclavicular region. Patient will then undergo additional planning continue with a boost to the lumpectomy cavity of 16 gray for a cumulative dose of 61 gray  -----------------------------------  Blair Promise, PhD, MD  This document serves as a record of services personally performed by Gery Pray, MD. It was created  on his behalf by Truddie Hidden, a trained medical scribe. The creation of this record is based on the scribe's personal observations and the provider's statements to them. This document has been checked and approved by the attending provider.

## 2016-01-10 NOTE — Telephone Encounter (Signed)
Kathryne Hitch, RN with Dr. Virgie Dad office to see if patient is Ok to get the flu shot from her friend that is a Lexic[employer].  Juliann Pulse asked Dr. Jana Hakim who said it was OK for [name] to get the flu shot.  Notified patient.

## 2016-01-11 NOTE — Progress Notes (Signed)
  Radiation Oncology         (336) [phone] ________________________________  Name: [name] MRN: [id number]  Date: 01/10/2016  DOB: [birth year]  Optical Surface Tracking Plan:  Since intensity modulated radiotherapy (IMRT) and 3D conformal radiation treatment methods are predicated on accurate and precise positioning for treatment, intrafraction motion monitoring is medically necessary to ensure accurate and safe treatment delivery.  The ability to quantify intrafraction motion without excessive ionizing radiation dose can only be performed with optical surface tracking. Accordingly, surface imaging offers the opportunity to obtain 3D measurements of patient position throughout IMRT and 3D treatments without excessive radiation exposure.  I am ordering optical surface tracking for this patient's upcoming course of radiotherapy. ________________________________  Gery Pray, MD 01/11/2016 8:12 PM    Reference:   Ursula Alert, J, et al. Surface imaging-based analysis of intrafraction motion for breast radiotherapy patients.Journal of [place], n. 6, nov. 2014. ISSN [id number].   Available at: <http://www.jacmp.org/index.php/jacmp/article/view/4957>.

## 2016-01-13 ENCOUNTER — Other Ambulatory Visit: Payer: Self-pay | Admitting: *Deleted

## 2016-01-13 DIAGNOSIS — Z510 Encounter for antineoplastic radiation therapy: Secondary | ICD-10-CM | POA: Diagnosis not present

## 2016-01-13 MED ORDER — CYCLOSPORINE 0.05 % OP EMUL: 1.0000 [drp] | Freq: Two times a day (BID) | OPHTHALMIC | 3 refills | Status: DC

## 2016-01-17 ENCOUNTER — Ambulatory Visit
Admission: RE | Admit: 2016-01-17 | Discharge: 2016-01-17 | Disposition: A | Discharge status: Home / Self Care | Payer: Medicare Other | Source: Ambulatory Visit | Attending: Radiation Oncology | Admitting: Radiation Oncology

## 2016-01-17 DIAGNOSIS — Z510 Encounter for antineoplastic radiation therapy: Secondary | ICD-10-CM | POA: Diagnosis not present

## 2016-01-17 DIAGNOSIS — C50411 Malignant neoplasm of upper-outer quadrant of right female breast: Secondary | ICD-10-CM

## 2016-01-17 NOTE — Progress Notes (Signed)
  Radiation Oncology         (336) [phone] ________________________________  Name: [name] MRN: [id number]  Date: 01/17/2016  DOB: April 27, 1946  Simulation Verification Note    ICD-9-CM ICD-10-CM   1. Breast cancer of upper-outer quadrant of right female breast ([place]) 174.4 C50.411     Status: outpatient  NARRATIVE: The patient was brought to the treatment unit and placed in the planned treatment position. The clinical setup was verified. Then port films were obtained and uploaded to the radiation oncology medical record software.  The treatment beams were carefully compared against the planned radiation fields. The position location and shape of the radiation fields was reviewed. They targeted volume of tissue appears to be appropriately covered by the radiation beams. Organs at risk appear to be excluded as planned.  Based on my personal review, I approved the simulation verification. The patient's treatment will proceed as planned.  -----------------------------------  Blair Promise, PhD, MD

## 2016-01-18 ENCOUNTER — Telehealth: Payer: Self-pay | Admitting: Pharmacist

## 2016-01-18 ENCOUNTER — Ambulatory Visit
Admission: RE | Admit: 2016-01-18 | Discharge: 2016-01-18 | Disposition: A | Discharge status: Home / Self Care | Payer: Medicare Other | Source: Ambulatory Visit | Attending: Radiation Oncology | Admitting: Radiation Oncology

## 2016-01-18 DIAGNOSIS — Z510 Encounter for antineoplastic radiation therapy: Secondary | ICD-10-CM | POA: Diagnosis not present

## 2016-01-18 NOTE — Telephone Encounter (Signed)
Oral Chemotherapy Pharmacist Encounter   I spoke with patient for overview of new oral chemotherapy medication: Xeloda.  Pt is doing well, radiation to start this week.   Counseled patient on administration, dosing, side effects, safe handling, and monitoring. Patient counseled to take 1000mg  (2 tabs) in the morning and in the evening after a meal on days of radiation only. This differs from directions on bottle as patient will increase Xeloda dose after radiation is complete.   Side effects include but not limited to: N/V/D, abdominal pain, fatigue, and hand-foot syndrome. Pt stated that she still has daily nausea due to previous chemo that is currently not controlled. Pt counseled to schedule ondansetron and compazine to see if we can't get nausea under control. She also requested refills of compazine to be sent to her pharmacy and I will reach out to Dr. Virgie Dad nurse about this prescription.  [name] voiced understanding and appreciation.   All questions answered. Patient expressed appreciation for the call.   Will follow up in 1-2 weeks for adherence and toxicity management.   Thank you,  Johny Drilling, PharmD, BCPS 01/18/2016  12:26 PM Oral Chemotherapy Clinic [phone]

## 2016-01-18 NOTE — Telephone Encounter (Signed)
Oral Chemotherapy Pharmacist Encounter   LVM for patient today to go over new prescription Xeloda. General administration instructions left on personal voicemail to take medication 2 times daily with a meal on radiation days only.  Left phone number to oral chemo office on voicemail for any questions or concerns.  Will follow up in 1-2 weeks for adherence and toxicity management.   Thank you,  Johny Drilling, PharmD, BCPS 01/18/2016  11:18 AM Oral Chemotherapy Clinic [phone]

## 2016-01-19 ENCOUNTER — Ambulatory Visit: Payer: Medicare Other

## 2016-01-20 ENCOUNTER — Inpatient Hospital Stay
Admission: RE | Admit: 2016-01-20 | Discharge: 2016-01-20 | Disposition: A | Discharge status: Home / Self Care | Payer: Self-pay | Source: Ambulatory Visit | Attending: Radiation Oncology | Admitting: Radiation Oncology

## 2016-01-20 ENCOUNTER — Ambulatory Visit
Admission: RE | Admit: 2016-01-20 | Discharge: 2016-01-20 | Disposition: A | Discharge status: Home / Self Care | Payer: Medicare Other | Source: Ambulatory Visit | Attending: Radiation Oncology | Admitting: Radiation Oncology

## 2016-01-20 ENCOUNTER — Ambulatory Visit: Payer: Medicare Other

## 2016-01-20 DIAGNOSIS — Z510 Encounter for antineoplastic radiation therapy: Secondary | ICD-10-CM | POA: Diagnosis not present

## 2016-01-20 DIAGNOSIS — C50411 Malignant neoplasm of upper-outer quadrant of right female breast: Secondary | ICD-10-CM

## 2016-01-20 MED ORDER — ALRA NON-METALLIC DEODORANT (RAD-ONC)
1.0000 "application " | Freq: Once | TOPICAL | Status: AC
  Administered 2016-01-20: 1 via TOPICAL

## 2016-01-20 MED ORDER — RADIAPLEXRX EX GEL
Freq: Once | CUTANEOUS | Status: AC
  Administered 2016-01-20: 16:00:00 via TOPICAL

## 2016-01-20 NOTE — Progress Notes (Signed)
Pt here for patient teaching.  Pt given Radiation and You booklet, skin care instructions, Alra deodorant and Radiaplex gel.  Reviewed areas of pertinence such as fatigue, skin changes, breast tenderness and breast swelling . Pt able to give teach back of to pat skin and use unscented/gentle soap,apply Radiaplex bid and avoid applying anything to skin within 4 hours of treatment. Pt demonstrated understanding and verbalizes understanding of information given and will contact nursing with any questions or concerns.          

## 2016-01-22 ENCOUNTER — Other Ambulatory Visit: Payer: Self-pay | Admitting: Oncology

## 2016-01-22 DIAGNOSIS — C50411 Malignant neoplasm of upper-outer quadrant of right female breast: Secondary | ICD-10-CM

## 2016-01-22 MED ORDER — PROCHLORPERAZINE MALEATE 10 MG PO TABS: 10.0000 mg | ORAL_TABLET | Freq: Four times a day (QID) | ORAL | 1 refills | Status: DC | PRN

## 2016-01-23 ENCOUNTER — Other Ambulatory Visit (HOSPITAL_BASED_OUTPATIENT_CLINIC_OR_DEPARTMENT_OTHER): Payer: Medicare Other

## 2016-01-23 ENCOUNTER — Other Ambulatory Visit: Payer: Self-pay | Admitting: *Deleted

## 2016-01-23 ENCOUNTER — Ambulatory Visit
Admission: RE | Admit: 2016-01-23 | Discharge: 2016-01-23 | Disposition: A | Discharge status: Home / Self Care | Payer: Medicare Other | Source: Ambulatory Visit | Attending: Radiation Oncology | Admitting: Radiation Oncology

## 2016-01-23 DIAGNOSIS — Z510 Encounter for antineoplastic radiation therapy: Secondary | ICD-10-CM | POA: Diagnosis not present

## 2016-01-23 DIAGNOSIS — C50411 Malignant neoplasm of upper-outer quadrant of right female breast: Secondary | ICD-10-CM

## 2016-01-23 DIAGNOSIS — C773 Secondary and unspecified malignant neoplasm of axilla and upper limb lymph nodes: Secondary | ICD-10-CM

## 2016-01-23 LAB — CBC WITH DIFFERENTIAL/PLATELET
BASO%: 0.6 % (ref 0.0–2.0)
Basophils Absolute: 0 10*3/uL (ref 0.0–0.1)
EOS%: 3.4 % (ref 0.0–7.0)
Eosinophils Absolute: 0.2 10*3/uL (ref 0.0–0.5)
HCT: 39.4 % (ref 34.8–46.6)
HGB: 13.3 g/dL (ref 11.6–15.9)
LYMPH%: 19.7 % (ref 14.0–49.7)
MCH: 32.5 pg (ref 25.1–34.0)
MCHC: 33.7 g/dL (ref 31.5–36.0)
MCV: 96.6 fL (ref 79.5–101.0)
MONO#: 0.5 10*3/uL (ref 0.1–0.9)
MONO%: 8.3 % (ref 0.0–14.0)
NEUT#: 4.3 10*3/uL (ref 1.5–6.5)
NEUT%: 68 % (ref 38.4–76.8)
PLATELETS: 302 10*3/uL (ref 145–400)
RBC: 4.08 10*6/uL (ref 3.70–5.45)
RDW: 13.4 % (ref 11.2–14.5)
WBC: 6.4 10*3/uL (ref 3.9–10.3)
lymph#: 1.3 10*3/uL (ref 0.9–3.3)

## 2016-01-23 LAB — COMPREHENSIVE METABOLIC PANEL
ALBUMIN: 3.5 g/dL (ref 3.5–5.0)
ALK PHOS: 71 U/L (ref 40–150)
ALT: 12 U/L (ref 0–55)
ANION GAP: 10 meq/L (ref 3–11)
AST: 15 U/L (ref 5–34)
BUN: 8.9 mg/dL (ref 7.0–26.0)
CO2: 26 meq/L (ref 22–29)
Calcium: 9.4 mg/dL (ref 8.4–10.4)
Chloride: 101 mEq/L (ref 98–109)
Creatinine: 0.9 mg/dL (ref 0.6–1.1)
EGFR: 69 mL/min/{1.73_m2} — AB (ref >90)
GLUCOSE: 94 mg/dL (ref 70–140)
POTASSIUM: 4 meq/L (ref 3.5–5.1)
SODIUM: 137 meq/L (ref 136–145)
Total Bilirubin: 0.32 mg/dL (ref 0.20–1.20)
Total Protein: 6.8 g/dL (ref 6.4–8.3)

## 2016-01-23 MED ORDER — PROCHLORPERAZINE MALEATE 10 MG PO TABS: 10.0000 mg | ORAL_TABLET | Freq: Four times a day (QID) | ORAL | 1 refills | Status: DC | PRN

## 2016-01-24 ENCOUNTER — Ambulatory Visit
Admission: RE | Admit: 2016-01-24 | Discharge: 2016-01-24 | Disposition: A | Discharge status: Home / Self Care | Payer: Medicare Other | Source: Ambulatory Visit | Attending: Radiation Oncology | Admitting: Radiation Oncology

## 2016-01-24 ENCOUNTER — Encounter: Payer: Self-pay | Admitting: Radiation Oncology

## 2016-01-24 VITALS — BP 126/86 | HR 69 | Temp 98.2°F | Ht 62.0 in | Wt 161.5 lb

## 2016-01-24 DIAGNOSIS — Z510 Encounter for antineoplastic radiation therapy: Secondary | ICD-10-CM | POA: Diagnosis not present

## 2016-01-24 DIAGNOSIS — C50411 Malignant neoplasm of upper-outer quadrant of right female breast: Secondary | ICD-10-CM

## 2016-01-24 NOTE — Progress Notes (Signed)
[name] has completed 4 fractions to her right breast.  She reports having bilateral hip pain which she has had since chemotherapy.  She reports her fatigue is improving.  She is taking Xeloda.  She has started using radiaplex.  The skin on her right breast is pink.  BP 126/86 (BP Location: Left Arm, Patient Position: Sitting)   Pulse 69   Temp 98.2 F (36.8 C) (Oral)   Ht 5\' 2"  (1.575 m)   Wt 161 lb 8 oz (73.3 kg)   SpO2 98%   BMI 29.54 kg/m    Wt Readings from Last 3 Encounters:  01/24/16 161 lb 8 oz (73.3 kg)  12/28/15 164 lb 9.6 oz (74.7 kg)  12/21/15 167 lb (75.8 kg)

## 2016-01-24 NOTE — Progress Notes (Signed)
  Radiation Oncology         [phone]) [phone] ________________________________  Name: [name] MRN: [id number]  Date: 01/24/2016  DOB: 03-10-1946  Weekly Radiation Therapy Management    ICD-9-CM ICD-10-CM   1. Breast cancer of upper-outer quadrant of right female breast ([place]) 174.4 C50.411      Current Dose: 7.2 Gy     Planned Dose:  61 Gy  Narrative . . . . . . . . The patient presents for routine under treatment assessment.                                   [name] has completed 4 fractions to her right breast.  She reports having bilateral hip pain which she has had since chemotherapy.  She reports her fatigue is improving.  She is taking Xeloda.  She has started using radiaplex.                                  Set-up films were reviewed.                                 The chart was checked. Physical Findings. . .  height is 5\' 2"  (1.575 m) and weight is 161 lb 8 oz (73.3 kg). Her oral temperature is 98.2 F (36.8 C). Her blood pressure is 126/86 and her pulse is 69. Her oxygen saturation is 98%. . Weight essentially stable.  No significant changes. Lungs clear. Heart regular rhythm and rate. Minimal radiation reaction thus far Impression . . . . . . . The patient is tolerating radiation. Plan . . . . . . . . . . . . Continue treatment as planned.  ________________________________   Blair Promise, PhD, MD

## 2016-01-25 ENCOUNTER — Ambulatory Visit
Admission: RE | Admit: 2016-01-25 | Discharge: 2016-01-25 | Disposition: A | Discharge status: Home / Self Care | Payer: Medicare Other | Source: Ambulatory Visit | Attending: Radiation Oncology | Admitting: Radiation Oncology

## 2016-01-25 DIAGNOSIS — Z510 Encounter for antineoplastic radiation therapy: Secondary | ICD-10-CM | POA: Diagnosis not present

## 2016-01-26 ENCOUNTER — Ambulatory Visit
Admission: RE | Admit: 2016-01-26 | Discharge: 2016-01-26 | Disposition: A | Discharge status: Home / Self Care | Payer: Medicare Other | Source: Ambulatory Visit | Attending: Radiation Oncology | Admitting: Radiation Oncology

## 2016-01-26 DIAGNOSIS — Z510 Encounter for antineoplastic radiation therapy: Secondary | ICD-10-CM | POA: Diagnosis not present

## 2016-01-27 ENCOUNTER — Ambulatory Visit
Admission: RE | Admit: 2016-01-27 | Discharge: 2016-01-27 | Disposition: A | Discharge status: Home / Self Care | Payer: Medicare Other | Source: Ambulatory Visit | Attending: Radiation Oncology | Admitting: Radiation Oncology

## 2016-01-27 DIAGNOSIS — Z510 Encounter for antineoplastic radiation therapy: Secondary | ICD-10-CM | POA: Diagnosis not present

## 2016-01-30 ENCOUNTER — Ambulatory Visit
Admission: RE | Admit: 2016-01-30 | Discharge: 2016-01-30 | Disposition: A | Discharge status: Home / Self Care | Payer: Medicare Other | Source: Ambulatory Visit | Attending: Radiation Oncology | Admitting: Radiation Oncology

## 2016-01-30 ENCOUNTER — Encounter: Payer: Self-pay | Admitting: *Deleted

## 2016-01-30 ENCOUNTER — Ambulatory Visit (HOSPITAL_BASED_OUTPATIENT_CLINIC_OR_DEPARTMENT_OTHER): Payer: Medicare Other | Admitting: Oncology

## 2016-01-30 VITALS — BP 116/58 | HR 71 | Temp 97.9°F | Resp 18 | Ht 62.0 in | Wt 160.6 lb

## 2016-01-30 DIAGNOSIS — C50411 Malignant neoplasm of upper-outer quadrant of right female breast: Secondary | ICD-10-CM | POA: Diagnosis present

## 2016-01-30 DIAGNOSIS — M79605 Pain in left leg: Secondary | ICD-10-CM | POA: Diagnosis not present

## 2016-01-30 DIAGNOSIS — C773 Secondary and unspecified malignant neoplasm of axilla and upper limb lymph nodes: Secondary | ICD-10-CM

## 2016-01-30 DIAGNOSIS — Z803 Family history of malignant neoplasm of breast: Secondary | ICD-10-CM | POA: Diagnosis not present

## 2016-01-30 DIAGNOSIS — M810 Age-related osteoporosis without current pathological fracture: Secondary | ICD-10-CM | POA: Diagnosis not present

## 2016-01-30 DIAGNOSIS — Z880 Allergy status to penicillin: Secondary | ICD-10-CM | POA: Diagnosis not present

## 2016-01-30 DIAGNOSIS — J45909 Unspecified asthma, uncomplicated: Secondary | ICD-10-CM | POA: Diagnosis not present

## 2016-01-30 DIAGNOSIS — M25551 Pain in right hip: Secondary | ICD-10-CM

## 2016-01-30 DIAGNOSIS — K219 Gastro-esophageal reflux disease without esophagitis: Secondary | ICD-10-CM | POA: Diagnosis not present

## 2016-01-30 DIAGNOSIS — M797 Fibromyalgia: Secondary | ICD-10-CM | POA: Diagnosis not present

## 2016-01-30 DIAGNOSIS — Z170 Estrogen receptor positive status [ER+]: Secondary | ICD-10-CM

## 2016-01-30 DIAGNOSIS — Z87891 Personal history of nicotine dependence: Secondary | ICD-10-CM | POA: Diagnosis not present

## 2016-01-30 DIAGNOSIS — M79604 Pain in right leg: Secondary | ICD-10-CM | POA: Diagnosis not present

## 2016-01-30 DIAGNOSIS — Z510 Encounter for antineoplastic radiation therapy: Secondary | ICD-10-CM | POA: Diagnosis not present

## 2016-01-30 DIAGNOSIS — Z888 Allergy status to other drugs, medicaments and biological substances status: Secondary | ICD-10-CM | POA: Diagnosis not present

## 2016-01-30 DIAGNOSIS — M25552 Pain in left hip: Secondary | ICD-10-CM

## 2016-01-30 DIAGNOSIS — Z8249 Family history of ischemic heart disease and other diseases of the circulatory system: Secondary | ICD-10-CM | POA: Diagnosis not present

## 2016-01-30 DIAGNOSIS — Z79899 Other long term (current) drug therapy: Secondary | ICD-10-CM | POA: Diagnosis not present

## 2016-01-30 DIAGNOSIS — E039 Hypothyroidism, unspecified: Secondary | ICD-10-CM | POA: Diagnosis not present

## 2016-01-30 MED ORDER — OXYCODONE HCL 5 MG PO TABS: 5.0000 mg | ORAL_TABLET | Freq: Four times a day (QID) | ORAL | 0 refills | Status: DC | PRN

## 2016-01-30 MED ORDER — FLUCONAZOLE 100 MG PO TABS: 100.0000 mg | ORAL_TABLET | Freq: Every day | ORAL | 0 refills | Status: DC

## 2016-01-30 NOTE — Progress Notes (Signed)
[place]  Telephone:(336) [phone] Fax:(336) [phone]     ID: NEASIA FLEEMAN DOB: [birth year]  MR#: [id number]  JKK#:[id number]  Patient Care Team: Seward Carol, MD as PCP - General (Internal Medicine) Chauncey Cruel, MD as Consulting Physician (Oncology) Fanny Skates, MD as Consulting Physician (General Surgery) Luberta Robertson, MD (Radiology) Hennie Duos, MD as Consulting Physician (Rheumatology) Domingo Pulse, MD (Urology) Druscilla Brownie, MD as Consulting Physician (Dermatology) Luberta Mutter, MD as Consulting Physician (Ophthalmology) OTHER MD:  CHIEF COMPLAINT: Locally advanced breast cancer  CURRENT TREATMENT:  adjuvant radiation with capecitabine sensitization  BREAST CANCER HISTORY: From the original intake note:  Classie had routine mammographic screening in 08/18/2015 at the [facility], showing a right breast mass and possible axillary adenopathy. On 08/23/2015 she underwent bilateral diagnostic mammography with tomography and bilateral breast ultrasonography. The breast density was category B in the right breast there was a spiculated mass in the upper-outer quadrant measuring 1.2 cm and associated with pleomorphic calcifications. There were calcifications extending anteriorly and posteriorly from the mass worrisome for ductal carcinoma in situ. The total area in question is about 3 cm. There were also multiple enlarged right axillary lymph nodes. The mass was palpated as an area of fullness at the 12:00 position of the breast 3 cm from the nipple. There were palpable enlarged right axillary lymph nodes in the right axilla. Ultrasonography of the right breast confirmed an irregular hypoechoic mass in the area in question measuring 1.6 cm. There were multiple abnormal appearing enlarged right axillary lymph nodes, the largest measuring 2.3 cm.  In the left breast, mammography showed an area of architectural distortion associated with the left  nipple. This was not palpable. Ultrasonography of the left breast found no correlate. This area of the left breast was biopsied under tomography 08/24/2015. This showed (SAA [id number]) a complex sclerosing lesion and fibroadenoma.  On 08/23/2015 the patient underwent biopsy of the right breast mass in 1 of the abnormal right axillary lymph nodes. 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 Both biopsies were HER-2 nonamplified, the breast being ratio 1.27 and number per cell 1.90, and the lymph node 1 ratio 1.24 and number per cell 3.0.  On 08/26/2015 the patient underwent bilateral breast MRI. In the left breast there was no suspicious enhancement. The left axilla was benign. In the right breast there was a mass described as in the upper inner quadrant measuring 2.3 cm. Less than a centimeter away from this mass there was a satellite nodule measuring 0.9 cm. In the right axilla there were multiple enlarged right axillary lymph nodes, including a confluent region measuring up to 4 cm. There were also enlarged level II lymph nodes present measuring up to 2.1 cm.  The patient's subsequent history is as detailed below  INTERVAL HISTORY: Kerstin returns today for follow up of her locally advanced breast cancer. She is in her second week of radiation and she is receiving azacitabine sensitization. She takes 2 tablets twice daily on treatment days only.  So far she is tolerating the capecitabine well. She has not had mouth sores, diarrhea or plantar hallmark erythrodysesthesia problems.  REVIEW OF SYSTEMS: Generally she feels "a lot better". She still has pain in her hips and legs but can get up a flight of stairs without stopping which she could not do before. She  remains very tired. She still tearing. She will be seeing ophthalmology later today. Aside  from these issues a detailed review of systems today was stable  PAST MEDICAL HISTORY: Past Medical History:  Diagnosis Date  . Arthritis   . Asthma   . Cancer [facility])    breast cancer  . Chronic vaginitis   . Fibromyalgia   . GERD (gastroesophageal reflux disease)   . Hypoglycemia   . Hypoglycemia   . Hypothyroidism   . Interstitial cystitis   . Menopausal symptoms   . Osteoporosis   . Pneumonia   . PONV (postoperative nausea and vomiting)   . Thyroid disease    hypothyroid    PAST SURGICAL HISTORY: Past Surgical History:  Procedure Laterality Date  . BLADDER HYPEREXTENSION     X4  . BREAST LUMPECTOMY WITH NEEDLE LOCALIZATION AND AXILLARY LYMPH NODE DISSECTION Right 12/05/2015   Procedure: RIGHT BREAST NEEDLE LOCALIZED (WIRES X 2) LUMPECTOMY WITH AXILLARY LYMPH NODE DISSECTION;  Surgeon: Fanny Skates, MD;  Location: [place];  Service: General;  Laterality: Right;  . BREAST LUMPECTOMY WITH RADIOACTIVE SEED LOCALIZATION Left 12/05/2015   Procedure: RADIOACTIVE SEED GUIDED LEFT BREAST LUMPECTOMY;  Surgeon: Fanny Skates, MD;  Location: [place];  Service: General;  Laterality: Left;  . BUNIONECTOMY    . [place]     DR. INGRAM  . CHOLECYSTECTOMY  01/2006  . COLONOSCOPY    . DENTAL SURGERY     13 dental implants  . LEG SURGERY  06/2004   Broken tibia  . MOUTH SURGERY    . NASAL SINUS SURGERY     X4  . PORT-A-CATH REMOVAL Right 12/05/2015   Procedure: REMOVAL PORT-A-CATH;  Surgeon: Fanny Skates, MD;  Location: [place];  Service: General;  Laterality: Right;  . PORTACATH PLACEMENT Left 09/08/2015   Procedure: INSERTION OF PORT-A-CATH ;  Surgeon: Fanny Skates, MD;  Location: [facility] OR;  Service: General;  Laterality: Left;    FAMILY HISTORY Family History  Problem Relation Age of Onset  . Hypertension Mother   . Heart disease Mother   . Parkinsonism Father   . Heart disease Sister   . Cancer Sister     MELANOMA  . Breast cancer Sister     Age 22  The patient's  father died at age 1 from complications of Parkinson's disease. The patient's mother died age 27 with heart disease. Chauntae had no brothers. She had 2 sisters. One sister was diagnosed with breast cancer at age 62. She was tested for the BRCA gene and was negative. The same sister also had melanoma diagnosed in her 26s. The patient has one maternal great aunt diagnosed with breast cancer around age 87. There is no history of ovarian cancer in the family  GYNECOLOGIC HISTORY:  No LMP recorded. Patient is postmenopausal. Menarche age 33, first live birth age 30. The patient is GX P2. She stopped having periods in her early 73s and took hormone replacement until her breast cancer diagnosis, April 2017.  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 He has been in remission for more than 10 years. Their daughter Nira Conn lives in [place] were she is Production designer, theatre/television/film of the [employer]. Daughter Belenda Cruise is an infectious disease M.D. working and teaching at [employer] in [place]. The patient has one granddaughter,  44 months old as of May 2017. The family attends [employer]: Not in place   HEALTH MAINTENANCE: Social History  Substance Use Topics  . Smoking status: Former Smoker    Packs/day: 0.25    Years: 15.00    Types: Cigarettes  . Smokeless tobacco: Never Used  . Alcohol use 0.0 oz/week     Comment: social     Colonoscopy: 2016/ [facility]  PAP: 2015  Bone density:   Lipid panel:  Allergies  Allergen Reactions  . Nitrofurantoin Other (See Comments)    Unknown reaction  . Penicillins Other (See Comments)    Has patient had a PCN reaction causing immediate rash, facial/tongue/throat swelling, SOB or lightheadedness with hypotension: No Has patient had a PCN  reaction causing severe rash involving mucus membranes or skin necrosis: No Has patient had a PCN reaction that required hospitalization No Has patient had a PCN reaction occurring within the last 10 years: No If all of the above answers are "NO", then may proceed with Cephalosporin use.  . Chlorhexidine Other (See Comments)    [employer] IS OK. The CLOTH WIPE causes reaction. Causes itching all over the area  . Fluconazole Other (See Comments)    REACTION: elavated LFTs - only happened once  . Other Rash    Adhesive tape    Current Outpatient Prescriptions  Medication Sig Dispense Refill  . albuterol (PROVENTIL HFA;VENTOLIN HFA) 108 (90 Base) MCG/ACT inhaler Inhale 1-2 puffs into the lungs every 6 (six) hours as needed for wheezing or shortness of breath.    Marland Kitchen buPROPion (WELLBUTRIN XL) 300 MG 24 hr tablet Take 300 mg by mouth daily.      . calcitonin, salmon, (MIACALCIN/FORTICAL) 200 UNIT/ACT nasal spray Place 1 spray into alternate nostrils daily. 3.7 mL 11  . Calcium Carbonate-Vitamin D (CALCIUM + D PO) Take 1 tablet by mouth at bedtime.     . capecitabine (XELODA) 500 MG tablet Take 3 tablets (1,500 mg total) by mouth 2 (two) times daily after a meal. 42 tablet 6  . carisoprodol (SOMA) 350 MG tablet Take 350 mg by mouth 2 (two) times daily as needed for muscle spasms.   2  . cycloSPORINE (RESTASIS) 0.05 % ophthalmic emulsion Place 1 drop into both eyes 2 (two) times daily. 5.5 mL 3  . diazepam (VALIUM) 5 MG tablet Take 5 mg by mouth every 6 (six) hours as needed for muscle spasms.     . fluconazole (DIFLUCAN) 100 MG tablet Take 1 tablet (100 mg total) by mouth daily. Take 2 tablets on day 1 8 tablet 0  . hyaluronate sodium (RADIAPLEXRX) GEL Apply 1 application topically 2 (two) times daily.    Marland Kitchen HYDROcodone-acetaminophen (NORCO) 10-325 MG tablet Take 1 tablet by mouth every 4 (four) hours as needed for moderate pain or severe pain.    . hydrOXYzine (ATARAX/VISTARIL) 25 MG tablet Take 1 tablet  (25 mg total) by mouth 3 (three) times daily as needed for anxiety or itching. 30 tablet 0  . levothyroxine (SYNTHROID) 137 MCG tablet Take 137 mcg by mouth daily.     Marland Kitchen lidocaine-prilocaine (EMLA) cream Apply to affected area once 30 g 3  . LORazepam (ATIVAN) 0.5 MG tablet Take 1 tablet (0.5 mg total) by mouth at bedtime as needed (Nausea or vomiting). 30 tablet 0  . Multiple Vitamin (MULTIVITAMIN) capsule Take 1 capsule by mouth daily.      . nabumetone (RELAFEN) 750 MG tablet Take 750 mg  by mouth 2 (two) times daily as needed for mild pain.    Marland Kitchen oxyCODONE (OXY IR/ROXICODONE) 5 MG immediate release tablet Take 1-2 tablets (5-10 mg total) by mouth every 6 (six) hours as needed for severe pain. 40 tablet 0  . pantoprazole (PROTONIX) 40 MG tablet Take 40 mg by mouth daily.    Vladimir Faster Glycol-Propyl Glycol (SYSTANE OP) Place 1 drop into both eyes as needed (for dry eyes).    . prochlorperazine (COMPAZINE) 10 MG tablet Take 1 tablet (10 mg total) by mouth every 6 (six) hours as needed (Nausea or vomiting). 30 tablet 1  . triamterene-hydrochlorothiazide (DYAZIDE) 37.5-25 MG capsule Take 1 tablet by mouth daily.     . valACYclovir (VALTREX) 500 MG tablet Take 1 tablet (500 mg total) by mouth 2 (two) times daily. (Patient not taking: Reported on 01/24/2016) 60 tablet 1  . Vitamin D, Ergocalciferol, (DRISDOL) 50000 UNITS CAPS capsule One tablet every other week (Patient taking differently: Take 50,000 Units by mouth every 14 (fourteen) days. One tablet every other week) 10 capsule 3  . zolpidem (AMBIEN) 10 MG tablet Take 10 mg by mouth at bedtime as needed for sleep.      No current facility-administered medications for this visit.     OBJECTIVE: Middle-aged white woman in no acute distress Vitals:   01/30/16 1219  BP: (!) 116/58  Pulse: 71  Resp: 18  Temp: 97.9 F (36.6 C)     Body mass index is 29.37 kg/m.    ECOG FS:1 - Symptomatic but completely ambulatory  Sclerae unicteric, pupils round  and equal Oropharynx clear and moist-- no thrush or other lesions No cervical or supraclavicular adenopathy Lungs no rales or rhonchi Heart regular rate and rhythm Abd soft, nontender, positive bowel sounds MSK no focal spinal tenderness, no upper extremity lymphedema Neuro: nonfocal, well oriented, appropriate affect Breasts: Deferred  LAB RESULTS:  CMP     Component Value Date/Time   NA 137 01/23/2016 1126   K 4.0 01/23/2016 1126   CL 101 12/15/2015 2044   CO2 26 01/23/2016 1126   GLUCOSE 94 01/23/2016 1126   BUN 8.9 01/23/2016 1126   CREATININE 0.9 01/23/2016 1126   CALCIUM 9.4 01/23/2016 1126   PROT 6.8 01/23/2016 1126   ALBUMIN 3.5 01/23/2016 1126   AST 15 01/23/2016 1126   ALT 12 01/23/2016 1126   ALKPHOS 71 01/23/2016 1126   BILITOT 0.32 01/23/2016 1126   GFRNONAA >60 12/15/2015 2044   GFRAA >60 12/15/2015 2044    INo results found for: SPEP, UPEP  Lab Results  Component Value Date   WBC 6.4 01/23/2016   NEUTROABS 4.3 01/23/2016   HGB 13.3 01/23/2016   HCT 39.4 01/23/2016   MCV 96.6 01/23/2016   PLT 302 01/23/2016      Chemistry      Component Value Date/Time   NA 137 01/23/2016 1126   K 4.0 01/23/2016 1126   CL 101 12/15/2015 2044   CO2 26 01/23/2016 1126   BUN 8.9 01/23/2016 1126   CREATININE 0.9 01/23/2016 1126      Component Value Date/Time   CALCIUM 9.4 01/23/2016 1126   ALKPHOS 71 01/23/2016 1126   AST 15 01/23/2016 1126   ALT 12 01/23/2016 1126   BILITOT 0.32 01/23/2016 1126       No results found for: LABCA2  No components found for: LABCA125  No results for input(s): INR in the last 168 hours.  Urinalysis    Component Value Date/Time  COLORURINE YELLOW 12/15/2015 [place] 12/15/2015 2208   LABSPEC 1.014 12/15/2015 2208   PHURINE 6.5 12/15/2015 [place] 12/15/2015 2208   HGBUR NEGATIVE 12/15/2015 2208   BILIRUBINUR NEGATIVE 12/15/2015 2208   KETONESUR NEGATIVE 12/15/2015 2208   PROTEINUR  NEGATIVE 12/15/2015 2208   NITRITE NEGATIVE 12/15/2015 2208   LEUKOCYTESUR NEGATIVE 12/15/2015 2208      ELIGIBLE FOR AVAILABLE RESEARCH PROTOCOL: PALLAS  STUDIES: No results found.  ASSESSMENT: 70 y.o. [place] woman status post right breast upper outer quadrant and right axillary lymph node biopsy 08/23/2015, both positive for a clinical T1 N2, stage IIIA invasive ductal carcinoma, grade 2, strongly estrogen receptor positive, 5% progesterone receptor positive, with an MIB-120-30% and no HER-2 amplification  (a) biopsy of a 0.9 cm Right breast satellite nodule 09/13/2015 showed invasive ductal carcinoma, grade 2, E-cadherin strongly positive, with a prognostic panel identical to the larger right upper quadrant mass  (1) left breast biopsy 08/24/2015 shows a complex sclerosing lesion, excised 12/05/2015  (2) neoadjuvant chemotherapy consisting of cyclophosphamide and docetaxel every 21 days 4, with OnPro support, started 09/12/2015, completed 11/14/2015  (3) status post right lumpectomy and sentinel lymph node dissection 12/05/2015 for a residual pT2 pN3, stage IIIC invasive ductal carcinoma, grade 2, with a close anterior margin (skin)  (4) adjuvant radiation to follow with concurrent capecitabine sensitization,  t bcompeted 11/062017  (5) capecitabine to be continued for total of 6 months adjuvantly  (6) anti-estrogens for 10 years to follow at the completion of local treatment-- consider PALLAS trial ELEGIBILITY  (7) genetics testing (Askenazi descent) scheduled for 02/07/2016  PLAN: Yona has largely recovered from her chemotherapy treatments and is tolerating radiation well. She is thinking ahead and would like to create some paintings relating to her breast cancer experience and perhaps donate them to the cancer center. I will investigate what the procedure is to get that accomplished if possible.  We discussed the fact that docetaxel can cause inflammation of the tear duct  and resulted in epiphora. She is seeing Dr Illene Regulus to deal with this later today. Hopefully the Restasis we'll do the job and she will not need a surgical intervention  She is tolerating the capecitabine well and the plan is to continue that through the end of radiation. She may well have increased peeling and redness of the skin because of this. Once she recovers from that we will see if she can tolerate standard dose capecitabine for an additional 4 month. At that point she will start antiestrogen's.  Today we began to discuss the PALLAS study. I think she would be an excellent candidate for this. I have alerted our research nurse to make sure that we will not overstep any deadlines that would prevent her from participate in that study  Lacole knows to call for any problems that may develop before her next visit here...  Chauncey Cruel, MD  01/30/2016

## 2016-01-31 ENCOUNTER — Ambulatory Visit
Admission: RE | Admit: 2016-01-31 | Discharge: 2016-01-31 | Disposition: A | Discharge status: Home / Self Care | Payer: Medicare Other | Source: Ambulatory Visit | Attending: Radiation Oncology | Admitting: Radiation Oncology

## 2016-01-31 VITALS — BP 130/68 | HR 66 | Temp 97.6°F | Resp 18 | Ht 62.0 in | Wt 160.6 lb

## 2016-01-31 DIAGNOSIS — Z510 Encounter for antineoplastic radiation therapy: Secondary | ICD-10-CM | POA: Diagnosis not present

## 2016-01-31 DIAGNOSIS — C50411 Malignant neoplasm of upper-outer quadrant of right female breast: Secondary | ICD-10-CM

## 2016-01-31 NOTE — Progress Notes (Signed)
[name] has completed 9 fractions to her right breast.  She reports having bilateral hip pain and all over 6/10 which she has had since chemotherapy and has fibromyalgia.  She reports her fatigue is improving.  She is taking Xeloda.  She has started using radiaplex.  The skin on her right breast is pink, using Diflucan for yeast infection. mild desquamation. Wt Readings from Last 3 Encounters:  01/31/16 160 lb 9.6 oz (72.8 kg)  01/30/16 160 lb 9.6 oz (72.8 kg)  01/24/16 161 lb 8 oz (73.3 kg)  BP 130/68 (BP Location: Left Arm, Patient Position: Sitting, Cuff Size: Normal)   Pulse 66   Temp 97.6 F (36.4 C) (Oral)   Resp 18   Ht 5\' 2"  (1.575 m)   Wt 160 lb 9.6 oz (72.8 kg)   SpO2 96%   BMI 29.37 kg/m

## 2016-01-31 NOTE — Progress Notes (Signed)
  Radiation Oncology         [phone] ________________________________  Name: [name] MRN: [id number]  Date: 01/31/2016  DOB: 1945-05-11  Weekly Radiation Therapy Management    ICD-9-CM ICD-10-CM   1. Malignant neoplasm of upper-outer quadrant of right female breast, unspecified estrogen receptor status (HCC) 174.4 C50.411      Current Dose: 16.2 Gy     Planned Dose:  61 Gy  Narrative . . . . . . . . The patient presents for routine under treatment assessment.                                   Ms. Seaberg has completed 9 fractions to her right breast. She reports having bilateral hip pain and all over 6/10 which she has had since chemotherapy and has fibromyalgia. She reports her fatigue is improving. She is taking Xeloda. She has started using radiaplex. The skin on her right breast is pink, using Diflucan for yeast infection. mild desquamation. The patient reports that she has some blurry vision, and saw her opthamologist for this yesterday. The patient has tearing, . She reports that this is exacerbated with physical activity.                                   Set-up films were reviewed.                                 The chart was checked. Physical Findings. . .  height is 5\' 2"  (1.575 m) and weight is 160 lb 9.6 oz (72.8 kg). Her oral temperature is 97.6 F (36.4 C). Her blood pressure is 130/68 and her pulse is 66. Her respiration is 18 and oxygen saturation is 96%. . Weight essentially stable.  No significant changes. Lungs clear. Heart regular rhythm and rate. Minimal radiation reaction thus far. Impression . . . . . . . The patient is tolerating radiation. Plan . . . . . . . . . . . . Continue treatment as planned.  ________________________________   Blair Promise, PhD, MD  This document serves as a record of services personally performed by Gery Pray, MD. It was created on his behalf by Truddie Hidden, a trained medical scribe. The creation of this  record is based on the scribe's personal observations and the provider's statements to them. This document has been checked and approved by the attending provider.

## 2016-02-01 ENCOUNTER — Other Ambulatory Visit: Payer: Self-pay | Admitting: Oncology

## 2016-02-01 ENCOUNTER — Ambulatory Visit
Admission: RE | Admit: 2016-02-01 | Discharge: 2016-02-01 | Disposition: A | Discharge status: Home / Self Care | Payer: Medicare Other | Source: Ambulatory Visit | Attending: Radiation Oncology | Admitting: Radiation Oncology

## 2016-02-01 DIAGNOSIS — Z170 Estrogen receptor positive status [ER+]: Principal | ICD-10-CM

## 2016-02-01 DIAGNOSIS — C50411 Malignant neoplasm of upper-outer quadrant of right female breast: Secondary | ICD-10-CM

## 2016-02-01 DIAGNOSIS — Z510 Encounter for antineoplastic radiation therapy: Secondary | ICD-10-CM | POA: Diagnosis not present

## 2016-02-01 MED ORDER — DIAZEPAM 5 MG PO TABS: ORAL_TABLET | ORAL | 0 refills | Status: DC

## 2016-02-01 MED FILL — XELODA 500 MG TABLET: 500 | 7 days supply | Qty: 42 | Fill #1

## 2016-02-02 ENCOUNTER — Telehealth: Payer: Self-pay | Admitting: *Deleted

## 2016-02-02 ENCOUNTER — Other Ambulatory Visit: Payer: Self-pay | Admitting: *Deleted

## 2016-02-02 ENCOUNTER — Ambulatory Visit
Admission: RE | Admit: 2016-02-02 | Discharge: 2016-02-02 | Disposition: A | Discharge status: Home / Self Care | Payer: Medicare Other | Source: Ambulatory Visit | Attending: Radiation Oncology | Admitting: Radiation Oncology

## 2016-02-02 DIAGNOSIS — Z510 Encounter for antineoplastic radiation therapy: Secondary | ICD-10-CM | POA: Diagnosis not present

## 2016-02-02 NOTE — Telephone Encounter (Signed)
This RN noted 4 pharmacies on pt's med list per need to send prescription for Valium for use prior to MRI - noted multiple pharmacies with [facility] as top on list.  Called pt and obtained identified VM- message left informing pt above prescription being sent to [facility] per med list.

## 2016-02-03 ENCOUNTER — Other Ambulatory Visit: Payer: Self-pay | Admitting: Ophthalmology

## 2016-02-03 ENCOUNTER — Ambulatory Visit
Admission: RE | Admit: 2016-02-03 | Discharge: 2016-02-03 | Disposition: A | Discharge status: Home / Self Care | Payer: Medicare Other | Source: Ambulatory Visit | Attending: Radiation Oncology | Admitting: Radiation Oncology

## 2016-02-03 DIAGNOSIS — Z510 Encounter for antineoplastic radiation therapy: Secondary | ICD-10-CM | POA: Diagnosis not present

## 2016-02-03 DIAGNOSIS — H532 Diplopia: Secondary | ICD-10-CM

## 2016-02-04 ENCOUNTER — Ambulatory Visit (HOSPITAL_COMMUNITY): Payer: Medicare Other

## 2016-02-06 ENCOUNTER — Ambulatory Visit
Admission: RE | Admit: 2016-02-06 | Discharge: 2016-02-06 | Disposition: A | Discharge status: Home / Self Care | Payer: Medicare Other | Source: Ambulatory Visit | Attending: Radiation Oncology | Admitting: Radiation Oncology

## 2016-02-06 ENCOUNTER — Ambulatory Visit (HOSPITAL_COMMUNITY)
Admission: RE | Admit: 2016-02-06 | Discharge: 2016-02-06 | Disposition: A | Discharge status: Home / Self Care | Payer: Medicare Other | Source: Ambulatory Visit | Attending: Oncology | Admitting: Oncology

## 2016-02-06 DIAGNOSIS — I679 Cerebrovascular disease, unspecified: Secondary | ICD-10-CM | POA: Insufficient documentation

## 2016-02-06 DIAGNOSIS — Z170 Estrogen receptor positive status [ER+]: Secondary | ICD-10-CM | POA: Insufficient documentation

## 2016-02-06 DIAGNOSIS — Z510 Encounter for antineoplastic radiation therapy: Secondary | ICD-10-CM | POA: Diagnosis not present

## 2016-02-06 DIAGNOSIS — M47812 Spondylosis without myelopathy or radiculopathy, cervical region: Secondary | ICD-10-CM | POA: Insufficient documentation

## 2016-02-06 DIAGNOSIS — C50411 Malignant neoplasm of upper-outer quadrant of right female breast: Secondary | ICD-10-CM | POA: Diagnosis present

## 2016-02-06 MED ORDER — GADOBENATE DIMEGLUMINE 529 MG/ML IV SOLN
15.0000 mL | Freq: Once | INTRAVENOUS | Status: AC | PRN
  Administered 2016-02-06: 15 mL via INTRAVENOUS

## 2016-02-07 ENCOUNTER — Other Ambulatory Visit: Payer: Self-pay | Admitting: Oncology

## 2016-02-07 ENCOUNTER — Ambulatory Visit
Admission: RE | Admit: 2016-02-07 | Discharge: 2016-02-07 | Disposition: A | Discharge status: Home / Self Care | Payer: Medicare Other | Source: Ambulatory Visit | Attending: Radiation Oncology | Admitting: Radiation Oncology

## 2016-02-07 ENCOUNTER — Ambulatory Visit (HOSPITAL_BASED_OUTPATIENT_CLINIC_OR_DEPARTMENT_OTHER): Payer: Medicare Other | Admitting: Genetic Counselor

## 2016-02-07 ENCOUNTER — Other Ambulatory Visit: Payer: Medicare Other

## 2016-02-07 ENCOUNTER — Encounter: Payer: Self-pay | Admitting: Radiation Oncology

## 2016-02-07 VITALS — BP 141/75 | HR 70 | Temp 97.8°F | Resp 16 | Ht 62.0 in | Wt 159.8 lb

## 2016-02-07 DIAGNOSIS — C50411 Malignant neoplasm of upper-outer quadrant of right female breast: Secondary | ICD-10-CM | POA: Diagnosis present

## 2016-02-07 DIAGNOSIS — Z803 Family history of malignant neoplasm of breast: Secondary | ICD-10-CM

## 2016-02-07 DIAGNOSIS — Z8051 Family history of malignant neoplasm of kidney: Secondary | ICD-10-CM | POA: Diagnosis not present

## 2016-02-07 DIAGNOSIS — Z170 Estrogen receptor positive status [ER+]: Principal | ICD-10-CM

## 2016-02-07 DIAGNOSIS — Z510 Encounter for antineoplastic radiation therapy: Secondary | ICD-10-CM | POA: Diagnosis not present

## 2016-02-07 DIAGNOSIS — Z808 Family history of malignant neoplasm of other organs or systems: Secondary | ICD-10-CM

## 2016-02-07 DIAGNOSIS — IMO0001 Reserved for inherently not codable concepts without codable children: Secondary | ICD-10-CM

## 2016-02-07 DIAGNOSIS — Z315 Encounter for genetic counseling: Secondary | ICD-10-CM | POA: Diagnosis not present

## 2016-02-07 NOTE — Progress Notes (Signed)
[name] has completed 14 fractions to her right breast. She reports having bilateral hip pain and all over 6/10 which she has had since chemotherapy and has fibromyalgia. She reports her fatigue is improving. She is taking Xeloda. She has started using radiaplex. The skin on her right breast with erythema, mild moist desquamation given telfa pad today. Has some swelling down right arm. Wt Readings from Last 3 Encounters:  02/07/16 159 lb 12.8 oz (72.5 kg)  01/31/16 160 lb 9.6 oz (72.8 kg)  01/30/16 160 lb 9.6 oz (72.8 kg)  BP (!) 141/75 (BP Location: Left Arm, Patient Position: Sitting, Cuff Size: Normal)   Pulse 70   Temp 97.8 F (36.6 C) (Oral)   Resp 16   Ht 5\' 2"  (1.575 m)   Wt 159 lb 12.8 oz (72.5 kg)   SpO2 96%   BMI 29.23 kg/m

## 2016-02-07 NOTE — Progress Notes (Signed)
  Radiation Oncology         [phone] ________________________________  Name: [name] MRN: [id number]  Date: 02/07/2016  DOB: 07-28-1945  Weekly Radiation Therapy Management    ICD-9-CM ICD-10-CM   1. Malignant neoplasm of upper-outer quadrant of right female breast, unspecified estrogen receptor status ([place]) 174.4 C50.411 Ambulatory referral to Physical Therapy        Current Dose: 25.2 Gy     Planned Dose:  61 Gy  Narrative . . . . . . . . The patient presents for routine under treatment assessment.                                   Ms. Lehne has completed 96fractions to her right breast. She reports having bilateral hip pain and all over 6/10which she has had since chemotherapy and has fibromyalgia. She reports her fatigue is improving. She is taking Xeloda. She has started using radiaplex. The skin on her right breast with erythema, mild moist desquamation given telfa pad today. Has some swelling down right arm. The patient reported that she had an MRI last night.               Set-up films were reviewed.                                 The chart was checked. Physical Findings. . .  height is 5\' 2"  (1.575 m) and weight is 159 lb 12.8 oz (72.5 kg). Her oral temperature is 97.8 F (36.6 C). Her blood pressure is 141/75 (abnormal) and her pulse is 70. Her respiration is 16 and oxygen saturation is 96%. . Weight essentially stable.  No significant changes. Lungs clear. Heart regular rhythm and rate. Mild erythema to the right breast.   mild edema in the right arm and hand, no Signs of infection. Impression . . . . . . . The patient is tolerating radiation. Plan . . . . . . . . . . . . Continue treatment as planned. I answered the patient's questions on getting a compression sleeve for her right arm.   ________________________________   Blair Promise, PhD, MD  This document serves as a record of services personally performed by Gery Pray, MD. It was created on  his behalf by Truddie Hidden, a trained medical scribe. The creation of this record is based on the scribe's personal observations and the provider's statements to them. This document has been checked and approved by the attending provider.

## 2016-02-08 ENCOUNTER — Ambulatory Visit
Admission: RE | Admit: 2016-02-08 | Discharge: 2016-02-08 | Disposition: A | Discharge status: Home / Self Care | Payer: Medicare Other | Source: Ambulatory Visit | Attending: Radiation Oncology | Admitting: Radiation Oncology

## 2016-02-08 ENCOUNTER — Encounter: Payer: Self-pay | Admitting: Genetic Counselor

## 2016-02-08 ENCOUNTER — Ambulatory Visit (HOSPITAL_COMMUNITY): Payer: Medicare Other

## 2016-02-08 DIAGNOSIS — Z803 Family history of malignant neoplasm of breast: Secondary | ICD-10-CM | POA: Insufficient documentation

## 2016-02-08 DIAGNOSIS — Z510 Encounter for antineoplastic radiation therapy: Secondary | ICD-10-CM | POA: Diagnosis not present

## 2016-02-08 NOTE — Progress Notes (Signed)
REFERRING PROVIDER: Lurline Del, MD  PRIMARY PROVIDER:  Kandice Hams, MD  PRIMARY REASON FOR VISIT:  1. Malignant neoplasm of upper-outer quadrant of right breast in female, estrogen receptor positive ([place])   2. Family history of breast cancer in female   3. Ashkenazi Jewish ancestry   4. Family history of melanoma   5. Family history of kidney cancer      HISTORY OF PRESENT ILLNESS:   [name], a 70 y.o. female, was seen for a [place] cancer genetics consultation at the request of Dr. Jana Hakim due to a personal history of breast cancer and family history of Ashkenazi Jewish ancestry, and family history of breast, melanoma, and other cancers.  [name] presents to clinic today to discuss the possibility of a hereditary predisposition to cancer, genetic testing, and to further clarify her future cancer risks, as well as potential cancer risks for family members.   In April 2017, at the age of 77, [name] was diagnosed with invasive ductal carcinoma with DCIS of the right breast and with complex sclerosing lesion,  calcifications, fibroadenoma, and sclerosing adenosis of the left breast.  Hormone receptor status of the breast tumor was ER/PR+ and Her2-.  This was treated with bilateral lumpectomies, chemotherapy, and radiation.  [name] reports no additional personal history of cancer.      HORMONAL RISK FACTORS:  Menarche was at age 70.  First live birth at age 85.  OCP use for approximately 1 year, remotely. Ovaries intact: yes.  Hysterectomy: no.  Menopausal status: postmenopausal; LMP in her early 98s HRT use: from the time of menopause to breast cancer diagnosis in 07/2015 years. Colonoscopy: yes; most recent colonoscopy was in 2016 or 2015; reports no history of colon polyps, but is due back on a 5-year schedule this time due to not being able to visualize the whole colon last time.. Mammogram within the last year: typically got annually, but skipped two  mammograms recently. Number of breast biopsies: multiple. Up to date with pelvic exams:  yes. Any excessive radiation exposure/other exposures in the past:  Reports history of sinus x-rays once per week for 1 year (due to history of frequent sinus infections); no additional reported exposures  Past Medical History:  Diagnosis Date  . Arthritis   . Asthma   . Cancer [facility])    breast cancer  . Chronic vaginitis   . Fibromyalgia   . GERD (gastroesophageal reflux disease)   . Hypoglycemia   . Hypoglycemia   . Hypothyroidism   . Interstitial cystitis   . Menopausal symptoms   . Osteoporosis   . Pneumonia   . PONV (postoperative nausea and vomiting)   . Thyroid disease    hypothyroid    Past Surgical History:  Procedure Laterality Date  . BLADDER HYPEREXTENSION     X4  . BREAST LUMPECTOMY WITH NEEDLE LOCALIZATION AND AXILLARY LYMPH NODE DISSECTION Right 12/05/2015   Procedure: RIGHT BREAST NEEDLE LOCALIZED (WIRES X 2) LUMPECTOMY WITH AXILLARY LYMPH NODE DISSECTION;  Surgeon: Fanny Skates, MD;  Location: [place];  Service: General;  Laterality: Right;  . BREAST LUMPECTOMY WITH RADIOACTIVE SEED LOCALIZATION Left 12/05/2015   Procedure: RADIOACTIVE SEED GUIDED LEFT BREAST LUMPECTOMY;  Surgeon: Fanny Skates, MD;  Location: [place];  Service: General;  Laterality: Left;  . BUNIONECTOMY    . [place]     DR. INGRAM  . CHOLECYSTECTOMY  01/2006  . COLONOSCOPY    . DENTAL SURGERY     13 dental  implants  . LEG SURGERY  06/2004   Broken tibia  . MOUTH SURGERY    . NASAL SINUS SURGERY     X4  . PORT-A-CATH REMOVAL Right 12/05/2015   Procedure: REMOVAL PORT-A-CATH;  Surgeon: Fanny Skates, MD;  Location: [place];  Service: General;  Laterality: Right;  . PORTACATH PLACEMENT Left 09/08/2015   Procedure: INSERTION OF PORT-A-CATH ;  Surgeon: Fanny Skates, MD;  Location: [place];  Service: General;  Laterality: Left;    Social History   Social History  . Marital status: Married     Spouse name: N/A  . Number of children: 2  . Years of education: N/A   Social History Main Topics  . Smoking status: Former Smoker    Packs/day: 0.25    Years: 15.00    Types: Cigarettes  . Smokeless tobacco: Never Used     Comment: social smoker/"on and off"  . Alcohol use 0.0 oz/week     Comment: social  . Drug use: No  . Sexual activity: Yes    Birth control/ protection: Post-menopausal   Other Topics Concern  . Not on file   Social History Narrative  . No narrative on file     FAMILY HISTORY:  We obtained a detailed, 4-generation family history.  Significant diagnoses are listed below: Family History  Problem Relation Age of Onset  . Hypertension Mother   . Heart disease Mother     d. 62  . Parkinsonism Father     d. 7  . Heart disease Sister   . Breast cancer Sister 26    s/p mastectomy; reportedly negative BRCA1/2 testing approx 7 years ago  . Melanoma Sister     d. 60s; hx of incresed sun exposure  . Cancer Sister     dx. sarcoma of her face in her 26s; treated w/ MOHS surgery  . Parkinsonism Paternal Aunt     d. 90s  . Parkinsonism Paternal Uncle     d. late 70s  . Kidney failure Maternal Grandmother 40  . Heart attack Maternal Grandfather     d. 43s  . Heart disease Paternal Grandmother     d. 45  . Breast cancer Other 83    maternal great aunt (MGF's sister)  . Kidney cancer Paternal Uncle 32    d. 10s; unsure about smoking status    [name] has two daughters, ages 3 and 52, neither of whom have ever been diagnosed with cancer.  Her youngest daughter has one 22-monthold daughter of her own and is due to have another baby in March 2018.  Ms. SMoriokahas two full sisters, ages 761and 75  Her oldest sister was diagnosed with breast cancer at 737 treated with mastectomy, and reportedly had negative BRCA1/2 testing at the time (approximately 5-7 years ago).  This same sister also has a history of melanoma (diagnosed in her 524s, and a history of a  sarcoma of her face, diagnosed in her 572sand treated with MOHS surgery.  This sister has three sons, all of whom are cancer-free.  Ms. SCardella715year old sister has never had cancer, and she has no children.    Ms. SCadoganmother died of heart disease at 826  She had two full sisters, both of whom have also passed away.  These two sisters passed in their 847sdue to either heart-related causes or stroke, and they were never diagnosed with cancer.  Ms. SSedlarreports no known cancers for any  of her maternal first cousins.  Ms. Lamia maternal grandmother died of kidney failure at 65.  She had four sisters, and Ms. Obeirne reports no known history of cancer in these great aunts.  Ms. Coste maternal grandfather died of a heart attack in his 48s.  He had three sisters and one brother, and one of his sisters was diagnosed with breast cancer at the age of 25.    Ms. Licklider father died from parkinson's at 14.  He had one full sister and three full brothers.  One of his brothers was diagnosed with kidney cancer at 70 and passed away not long after.  He had three sons, one of whom passed away (not from cancer) and the other two sons are currently in their mid-80s-90 and have never had cancer.  Two other brothers died of parkinson's-related causes in their late 52s-90s.  Another brother died in his 61s, but not from cancer.  Ms. Alderfer reports no cancer in any of her paternal first cousins.  Her paternal grandmother died of heart disease at 50.  Her grandfather died of "old age" at 35.  She has limited information for her paternal great aunts/uncles and great grandparents.  Patient's maternal ancestors are of [place] and Ashkenazi Jewish descent, and paternal ancestors are of [place], [place], and Ashkenazi Jewish descent.  There is no known consanguinity.  GENETIC COUNSELING ASSESSMENT: CATHLEEN YAGI is a 70 y.o. female with a personal and family history of breast cancer which is somewhat  suggestive of a hereditary breast cancer syndrome and predisposition to cancer. We, therefore, discussed and recommended the following at today's visit.   DISCUSSION: We reviewed the characteristics, features and inheritance patterns of hereditary cancer syndromes, particularly those caused by mutations within the BRCA1/2, CHEK2, and PALB2 genes. We also discussed genetic testing, including the appropriate family members to test, the process of testing, insurance coverage and turn-around-time for results. We discussed the implications of a negative, positive and/or variant of uncertain significant result. We recommended Ms. Cleaves pursue genetic testing for the BRCA1/BRCA2 Ashkenazi Founder Panel through [employer] [facility] with reflex to the 20-gene Breast/Ovarian Cancer Panel, if negative.  The Breast/Ovarian Cancer Panel offered by [facility] Hope Pigeon, MD) includes sequencing and deletion/duplication analysis for the following 19 genes:  ATM, BARD1, BRCA1, BRCA2, BRIP1, CDH1, CHEK2, FANCC, MLH1, MSH2, MSH6, NBN, PALB2, PMS2, PTEN, RAD51C, RAD51D, TP53, and XRCC2.  This panel also includes deletion/duplication analysis (without sequencing) for one gene, EPCAM.   Based on Ms. Oquendo's personal and family history of cancer, she meets medical criteria for genetic testing. Despite that she meets criteria, she may still have an out of pocket cost. We discussed that if her out of pocket cost for testing is over $100, the laboratory will call and confirm whether she wants to proceed with testing.  If the out of pocket cost of testing is less than $100 she will be billed by the genetic testing laboratory.   PLAN: After considering the risks, benefits, and limitations, Ms. Mastrianni  provided informed consent to pursue genetic testing and the blood sample was sent to [facility] for analysis of the BRCA1/2 Ashkenazi Founder Panel with reflex to the 20-gene Breast/Ovarian Cancer Panel. Results  should be available within approximately 2-3 weeks' time, at which point they will be disclosed by telephone to Ms. Scrivner, as will any additional recommendations warranted by these results. Ms. Vandruff will receive a summary of her genetic counseling visit and a copy of her results once available.  This information will also be available in Epic. We encouraged Ms. Janes to remain in contact with cancer genetics annually so that we can continuously update the family history and inform her of any changes in cancer genetics and testing that may be of benefit for her family. Ms. Kocurek questions were answered to her satisfaction today. Our contact information was provided should additional questions or concerns arise.  Thank you for the referral and allowing [name] to share in the care of your patient.   Jeanine Luz, MS, [facility] Certified Genetic Counselor [email or web address] Phone: [phone]  The patient was seen for a total of 60 minutes in face-to-face genetic counseling.  This patient was discussed with Drs. Magrinat, Lindi Adie and/or Burr Medico who agrees with the above.    _______________________________________________________________________ For Office Staff:  Number of people involved in session: 1 Was an Intern/ student involved with case: no

## 2016-02-09 ENCOUNTER — Ambulatory Visit
Admission: RE | Admit: 2016-02-09 | Discharge: 2016-02-09 | Disposition: A | Discharge status: Home / Self Care | Payer: Medicare Other | Source: Ambulatory Visit | Attending: Radiation Oncology | Admitting: Radiation Oncology

## 2016-02-09 DIAGNOSIS — Z510 Encounter for antineoplastic radiation therapy: Secondary | ICD-10-CM | POA: Diagnosis not present

## 2016-02-10 ENCOUNTER — Ambulatory Visit
Admission: RE | Admit: 2016-02-10 | Discharge: 2016-02-10 | Disposition: A | Discharge status: Home / Self Care | Payer: Medicare Other | Source: Ambulatory Visit | Attending: Radiation Oncology | Admitting: Radiation Oncology

## 2016-02-10 DIAGNOSIS — Z510 Encounter for antineoplastic radiation therapy: Secondary | ICD-10-CM | POA: Diagnosis not present

## 2016-02-13 ENCOUNTER — Ambulatory Visit
Admission: RE | Admit: 2016-02-13 | Discharge: 2016-02-13 | Disposition: A | Discharge status: Home / Self Care | Payer: Medicare Other | Source: Ambulatory Visit | Attending: Radiation Oncology | Admitting: Radiation Oncology

## 2016-02-13 ENCOUNTER — Ambulatory Visit: Payer: Medicare Other | Admitting: Physical Therapy

## 2016-02-13 DIAGNOSIS — Z510 Encounter for antineoplastic radiation therapy: Secondary | ICD-10-CM | POA: Diagnosis not present

## 2016-02-14 ENCOUNTER — Ambulatory Visit: Payer: Medicare Other | Attending: Radiation Oncology | Admitting: Physical Therapy

## 2016-02-14 ENCOUNTER — Ambulatory Visit
Admission: RE | Admit: 2016-02-14 | Discharge: 2016-02-14 | Disposition: A | Discharge status: Home / Self Care | Payer: Medicare Other | Source: Ambulatory Visit | Attending: Radiation Oncology | Admitting: Radiation Oncology

## 2016-02-14 ENCOUNTER — Encounter: Payer: Self-pay | Admitting: Radiation Oncology

## 2016-02-14 VITALS — BP 129/100 | HR 63 | Temp 97.9°F | Ht 62.0 in | Wt 159.2 lb

## 2016-02-14 DIAGNOSIS — Z923 Personal history of irradiation: Secondary | ICD-10-CM

## 2016-02-14 DIAGNOSIS — R29898 Other symptoms and signs involving the musculoskeletal system: Secondary | ICD-10-CM | POA: Diagnosis present

## 2016-02-14 DIAGNOSIS — I890 Lymphedema, not elsewhere classified: Secondary | ICD-10-CM

## 2016-02-14 DIAGNOSIS — Z170 Estrogen receptor positive status [ER+]: Secondary | ICD-10-CM | POA: Insufficient documentation

## 2016-02-14 DIAGNOSIS — Z510 Encounter for antineoplastic radiation therapy: Secondary | ICD-10-CM

## 2016-02-14 DIAGNOSIS — C50411 Malignant neoplasm of upper-outer quadrant of right female breast: Secondary | ICD-10-CM | POA: Insufficient documentation

## 2016-02-14 MED ORDER — RADIAPLEXRX EX GEL
Freq: Once | CUTANEOUS | Status: AC
  Administered 2016-02-14: 17:00:00 via TOPICAL

## 2016-02-14 NOTE — Progress Notes (Signed)
  Radiation Oncology         [phone] ________________________________  Name: [name] MRN: [id number]  Date: 02/14/2016  DOB: [birth year]  Weekly Radiation Therapy Management    ICD-9-CM ICD-10-CM   1. Malignant neoplasm of upper-outer quadrant of right breast in female, estrogen receptor positive (H[facility]) 174.4 C50.411 hyaluronate sodium (RADIAPLEXRX) gel   V86.0 Z17.0         Current Dose: 34.2 Gy     Planned Dose:  61 Gy  Narrative . . . . . . . . The patient presents for routine under treatment assessment.                                   [name] has completed 19 fractions to her right breast.  She denies having pain besides her fibromyalgia.  She also reports having some soreness under her right arm.  She is taking Xeloda and continues to have nausea. Patient reports her burning sensation as "slight."                Set-up films were reviewed.                                 The chart was checked. Physical Findings. . .  height is 5\' 2"  (1.575 m) and weight is 159 lb 3.2 oz (72.2 kg). Her oral temperature is 97.9 F (36.6 C). Her blood pressure is 129/100 (abnormal) and her pulse is 63. Her oxygen saturation is 99%. . Weight essentially stable.  No significant changes. Lungs clear. Heart regular rhythm and rate. Mild erythema to the right breast and supraclavicular region.  More significant due to her concomitant xeloda.  no Signs of infection or skin breakdown.  Impression . . . . . . . The patient is tolerating radiation. Plan . . . . . . . . . . . . Continue treatment as planned. I told the patient that if she began to feel some itching along her breast, I could give her some sonafine. ________________________________   Blair Promise, PhD, MD  This document serves as a record of services personally performed by Gery Pray, MD. It was created on his behalf by Truddie Hidden, a trained medical scribe. The creation of this record is based on the scribe's personal  observations and the provider's statements to them. This document has been checked and approved by the attending provider.

## 2016-02-14 NOTE — Therapy (Addendum)
[place], [place], [address] Phone: [phone]   Fax:  [phone]  Physical Therapy Evaluation  Patient Details  Name: [name] MRN: [id number] Date of Birth: 05-Feb-1946 Referring Provider: Gery Pray, MD  Encounter Date: 02/14/2016      PT End of Session - 02/14/16 1539    Visit Number 1   Number of Visits 9   Date for PT Re-Evaluation 03/16/16   PT Start Time [id number]   PT Stop Time 1533   PT Time Calculation (min) 55 min   Activity Tolerance Patient tolerated treatment well   Behavior During Therapy [facility] for tasks assessed/performed      Past Medical History:  Diagnosis Date  . Arthritis   . Asthma   . Cancer [facility])    breast cancer  . Chronic vaginitis   . Fibromyalgia   . GERD (gastroesophageal reflux disease)   . Hypoglycemia   . Hypoglycemia   . Hypothyroidism   . Interstitial cystitis   . Menopausal symptoms   . Osteoporosis   . Pneumonia   . PONV (postoperative nausea and vomiting)   . Thyroid disease    hypothyroid    Past Surgical History:  Procedure Laterality Date  . BLADDER HYPEREXTENSION     X4  . BREAST LUMPECTOMY WITH NEEDLE LOCALIZATION AND AXILLARY LYMPH NODE DISSECTION Right 12/05/2015   Procedure: RIGHT BREAST NEEDLE LOCALIZED (WIRES X 2) LUMPECTOMY WITH AXILLARY LYMPH NODE DISSECTION;  Surgeon: Fanny Skates, MD;  Location: [place];  Service: General;  Laterality: Right;  . BREAST LUMPECTOMY WITH RADIOACTIVE SEED LOCALIZATION Left 12/05/2015   Procedure: RADIOACTIVE SEED GUIDED LEFT BREAST LUMPECTOMY;  Surgeon: Fanny Skates, MD;  Location: [place];  Service: General;  Laterality: Left;  . BUNIONECTOMY    . [place]     DR. INGRAM  . CHOLECYSTECTOMY  01/2006  . COLONOSCOPY    . DENTAL SURGERY     13 dental implants  . LEG SURGERY  06/2004   Broken tibia  . MOUTH SURGERY    . NASAL SINUS SURGERY     X4  . PORT-A-CATH REMOVAL Right 12/05/2015    Procedure: REMOVAL PORT-A-CATH;  Surgeon: Fanny Skates, MD;  Location: [place];  Service: General;  Laterality: Right;  . PORTACATH PLACEMENT Left 09/08/2015   Procedure: INSERTION OF PORT-A-CATH ;  Surgeon: Fanny Skates, MD;  Location: [place];  Service: General;  Laterality: Left;    There were no vitals filed for this visit.       Subjective Assessment - 02/14/16 1449    Subjective Right hand appears swollen and wants to do what she can to prevent lymphedema. Currently undergoing radiation and has a little skin irritation, but not much.   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 She was treated with neoadjuvant chemotherapy and had a right and left lumpectomy with right axillary lymph node dissection on 12/05/15, with 15 out of 17 lymph nodes being positive. She is currently undergoing radiation. She has a history of fibromyalgia and osteoporosis.   Patient Stated Goals to prevent lymphedema   Currently in Pain? Yes   Pain Score 2    Pain Location Chest   Pain Orientation Right   Pain Descriptors / Indicators Burning   Pain Type Surgical pain   Pain Onset More than a month ago   Aggravating Factors  nothing   Pain Relieving Factors pain medication  [facility] PT Assessment - 02/14/16 0001      Assessment   Medical Diagnosis right breast cancer   Referring Provider Gery Pray, MD   Onset Date/Surgical Date 12/05/15   Hand Dominance Right     Precautions   Precaution Comments cancer precautions; osteoporosis     Restrictions   Weight Bearing Restrictions No     Balance Screen   Has the patient fallen in the past 6 months No   Has the patient had a decrease in activity level because of a fear of falling?  No   Is the patient reluctant to leave their home because of a fear of falling?  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   Leisure walks about a mile per day, every day; does a lot of walking up and down stairs     Cognition   Overall Cognitive Status Within Functional Limits for tasks assessed     Observation/Other Assessments   Skin Integrity redness and irritation of the skin from radiation over her right breast   Other Surveys  --  LLIS: 4% impairment     Posture/Postural Control   Posture Comments forward head, rounded shoulders     ROM / Strength   AROM / PROM / Strength AROM     AROM   Overall AROM Comments bil shoulder AROM WFL for all motions     Ambulation/Gait   Ambulation/Gait Yes   Ambulation/Gait Assistance 7: Independent           LYMPHEDEMA/ONCOLOGY QUESTIONNAIRE - 02/14/16 1502      Lymphedema Assessments   Lymphedema Assessments Upper extremities     Right Upper Extremity Lymphedema   15 cm Proximal to Olecranon Process 31.5 cm   10 cm Proximal to Olecranon Process 29.4 cm   Olecranon Process 24 cm   15 cm Proximal to Ulnar Styloid Process 22 cm   10 cm Proximal to Ulnar Styloid Process 19.3 cm   Just Proximal to Ulnar Styloid Process 14.6 cm   Across Hand at [employer] 18.4 cm   At [place] of 2nd Digit 6.5 cm     Left Upper Extremity Lymphedema   15 cm Proximal to Olecranon Process 31.2 cm   10 cm Proximal to Olecranon Process 29.7 cm   Olecranon Process 23.2 cm   15 cm Proximal to Ulnar Styloid Process 21.1 cm   10 cm Proximal to Ulnar Styloid Process 19 cm   Just Proximal to Ulnar Styloid Process 13.8 cm   Across Hand at [employer] 17.5 cm   At [place] of 2nd Digit 5.9 cm                        PT Education - 02/14/16 1539    Education provided Yes   Education Details pt educated  on remedial exercises for lymphedema; provided with After Breast Cancer class packet   Person(s) Educated Patient   Methods Explanation;Demonstration;Handout   Comprehension Verbalized understanding;Returned demonstration  [facility] Goals - 02/14/16 1548      CC Long Term Goal  #1   Title Patient will be independent in self manual lymph drainage.   Time 4   Period Weeks   Status New     CC Long Term Goal  #2   Title Patient's family will be independent in compression bandaging.   Time 4   Period Weeks   Status New     CC Long Term Goal  #3   Title Patient will be independent in strength after breast cancer exercise program.   Time 4   Period Weeks   Status New     CC Long Term Goal  #4   Title Patient will report a 50% improvement in the swelling in her right UE.   Time 4   Period Weeks   Status New     CC Long Term Goal  #5   Title Patient will be knowledgeable about compression garments and how to obtain them.   Time 4   Period Weeks   Status New            Plan - 02/14/16 1540    Clinical Impression Statement Patient is a pleasant 70 year old female s/p left and right lumpectomy with right axillary lymph node dissection on 12/05/15 who is currently halfway through her radiation treatment. She presents with some swelling in her right hand and forearm that may evolve as she progresses through her radiation.  She  would like to learn how to prevent lymphedema. Circumference measurements are slightly increased on the right UE compared to the left UE. Her bilateral shoulder AROM is good and she does not report any other issues. Therapist educated patient on remedial exercises for lymphedema and provided patient with the After Breast Cancer class packet today. Therapist plans on instructing patient in self manual lymph drainage, educating patient's husband in compression bandaging, and instructing patient in strength after breast cancer program.    Rehab Potential Excellent   Clinical Impairments Affecting Rehab Potential recent right axillary lymph node dissection; currently undergoing radiation treatment   PT Frequency 2x / week   PT Duration 4 weeks   PT Treatment/Interventions ADLs/Self Care Home Management;Therapeutic exercise;Therapeutic activities;DME Instruction;Patient/family education;Manual techniques;Manual lymph drainage;Compression bandaging;Passive range of motion;Taping   PT Next Visit Plan begin instructing pt in self MLD; educate pt's husband on compression bandaging; instruct pt in strength after breast cancer program   Consulted and Agree with Plan of Care Patient      Patient will benefit from skilled therapeutic intervention in order to improve the following deficits and impairments:  Decreased skin integrity, Increased edema, Postural dysfunction  Visit Diagnosis: Lymphedema, not elsewhere classified - Plan: PT plan of care cert/re-cert  Other symptoms and signs involving the musculoskeletal system - Plan: PT plan of care cert/re-cert      G-Codes - [id number]    Functional Assessment Tool Used lymphedema life impact scale 4% impairment    Functional Limitation Self care   Self Care Current Status [id number]) At least 1 percent but less than 20 percent impaired, limited or restricted   Self Care Goal Status [id number]) 0 percent impaired, limited or restricted       Problem List Patient Active Problem List   Diagnosis Date Noted  . Family history of breast cancer in female 02/08/2016  . Rash and nonspecific skin eruption 10/03/2015  . Breast cancer of upper-outer quadrant of right female breast ([place]) 09/01/2015  .  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 Menopausal symptoms   . Chronic vaginitis   . Fibromyalgia   . Interstitial cystitis   . UNSPECIFIED HYPOTHYROIDISM 06/14/2009  . PURE HYPERCHOLESTEROLEMIA 06/14/2009  . DEPRESSIVE DISORDER NOT ELSEWHERE CLASSIFIED 06/14/2009  . PERIPHERAL  NEUROPATHY 06/14/2009  . ESSENTIAL HYPERTENSION, BENIGN 06/14/2009  . UNSPEC HEMORRHOIDS WITHOUT MENTION COMPLICATION [id number]  . ALLERGIC RHINITIS CAUSE UNSPECIFIED 06/14/2009  . ESOPHAGEAL REFLUX 06/14/2009  . ACUTE GASTRITIS WITHOUT MENTION OF HEMORRHAGE 06/14/2009  . IRRITABLE BOWEL SYNDROME 06/14/2009  . INSOMNIA UNSPECIFIED 06/14/2009  . INTERSTITIAL CYSTITIS 08/27/2007  . VULVOVAGINITIS, CHRONIC 08/27/2007  . Fibromyalgia syndrome 08/27/2007  . Osteoporosis 08/27/2007    [place] 02/14/2016, [place], [place], [address] Phone: [phone]   Fax:  [phone]  Name: [name] MRN: [id number] Date of Birth: [birth year]  Saverio Danker, SPT   Read, reviewed, edited and agree with student's findings and recommendations.    Maudry Diego, PT 02/14/16 4:12 PM

## 2016-02-14 NOTE — Patient Instructions (Signed)
Neck Side-Bending   Begin with chin level, head centered over spine. Slowly lower one ear toward shoulder keeping shoulder down. Hold __3__ seconds. Slowly return to starting position. Repeat to other side.  Repeat __5-10__ times to each side. Do 2-3 times a day. Also stretch by looking over right shoulder, then left.  Scapular Retraction (Standing)   With arms at sides, pinch shoulder blades together.  Hold 3 seconds. Repeat __5-10__ times per set.  Do __2-3__ sessions per day.   Active ROM Flexion    Raise arm to point to ceiling, keeping elbow straight. Hold __3__ seconds. Repeat _5-10___ times. Do _2-3___ sessions per day. Activity: Use this motion to reach for items on overhead shelves.     Flexion (Active)   Palm up, rest elbow on other hand. Bend as far as possible. Hold __3__ seconds. Repeat __5-10__ times. Do _2-3___ sessions per day. Can use other hand to bend elbow further if needed where bandage may limit end motion.  Copyright  VHI. All rights reserved.    Extension (Active With Finger Extension)   Llift hand with fingers straight, then bend wrist down.  Hold _3___ seconds each way. Repeat __5-10__ times. Do _2-3___ sessions per day.    AROM: Forearm Pronation / Supination   With right arm in handshake position, slowly rotate palm down. Relax. Then rotate palm up. Repeat __5-10__ times per set. Do __2-3__ sessions per day.  Copyright  VHI. All rights reserved.    AROM: Finger Flexion / Extension   Actively bend fingers of right hand. Start with knuckles furthest from palm, and slowly make a fist. Hold __3__ seconds. Relax. Then straighten fingers as far as possible. Repeat _5-10___ times per set.  Do __2-3__ sessions per day. Also spread fingers as far as able, then bring them back together.  Copyright  VHI. All rights reserved.   If any pain/tingling symptoms occur, try all of these exercises first to promote circulation. If symptoms do not  ease off, then remove bandages and bring all wrappings back to next appointment.    

## 2016-02-14 NOTE — Addendum Note (Signed)
Encounter addended by: Jacqulyn Liner, RN on: 02/14/2016  5:16 PM<BR>    Actions taken: [facility] administration accepted

## 2016-02-14 NOTE — Progress Notes (Signed)
Fredrick has completed 19 fractions to her right breast.  She denies having pain besides her fibromyalgia.  She also reports having some soreness under her right arm.  She is taking Xeloda and continues to have nausea.  She reports having fatigue.  She reports having an MRI of her brain last week due to vision changes.  She said everything was normal.  She is using radiaplex 2-3 times a day and has been provided with a refill.  The skin on her right breast is red.  BP (!) 129/100 (BP Location: Left Arm, Patient Position: Sitting)   Pulse 63   Temp 97.9 F (36.6 C) (Oral)   Ht 5\' 2"  (1.575 m)   Wt 159 lb 3.2 oz (72.2 kg)   SpO2 99%   BMI 29.12 kg/m    Wt Readings from Last 3 Encounters:  02/14/16 159 lb 3.2 oz (72.2 kg)  02/07/16 159 lb 12.8 oz (72.5 kg)  01/31/16 160 lb 9.6 oz (72.8 kg)

## 2016-02-15 ENCOUNTER — Ambulatory Visit: Payer: Medicare Other

## 2016-02-15 ENCOUNTER — Ambulatory Visit
Admission: RE | Admit: 2016-02-15 | Discharge: 2016-02-15 | Disposition: A | Discharge status: Home / Self Care | Payer: Medicare Other | Source: Ambulatory Visit | Attending: Radiation Oncology | Admitting: Radiation Oncology

## 2016-02-15 DIAGNOSIS — R29898 Other symptoms and signs involving the musculoskeletal system: Secondary | ICD-10-CM

## 2016-02-15 DIAGNOSIS — I890 Lymphedema, not elsewhere classified: Secondary | ICD-10-CM | POA: Diagnosis not present

## 2016-02-15 DIAGNOSIS — Z510 Encounter for antineoplastic radiation therapy: Secondary | ICD-10-CM | POA: Diagnosis not present

## 2016-02-15 MED FILL — XELODA 500 MG TABLET: 500 | 7 days supply | Qty: 42 | Fill #2

## 2016-02-15 NOTE — Patient Instructions (Signed)
Start with circles near neck 5 times.   Cancer Rehab [phone] Deep Effective Breath   Standing, sitting, or laying down, place both hands on the belly. Take a deep breath IN, expanding the belly; then breath OUT, contracting the belly. Repeat __5__ times. Do __2-3__ sessions per day and before your self massage.  http://gt2.exer.us/866   Copyright  VHI. All rights reserved.  Axilla to Axilla - Sweep   On uninvolved side make 5 circles in the armpit, then pump _5__ times from involved armpit across chest to uninvolved armpit, making a pathway. Do _1__ time per day.  Copyright  VHI. All rights reserved.  Axilla to Inguinal Nodes - Sweep   On involved side, make 5 circles at groin at panty line, then pump _5__ times from armpit along side of trunk to outer hip, making your other pathway. Do __1_ time per day.  Copyright  VHI. All rights reserved.  Arm Posterior: Elbow to Shoulder - Sweep   Pump _5__ times from back of elbow to top of shoulder. Then inner to outer upper arm _5_ times, then outer arm again _5_ times. Then back to the pathways _2-3_ times. Do _1__ time per day.  Copyright  VHI. All rights reserved.  ARM: [place] Wrist to Elbow - Sweep   Pump or stationary circles _5__ times from wrist to elbow making sure to do both sides of the forearm. Then retrace your steps to the outer arm, and the pathways _2-3_ times each. Do _1__ time per day.  Copyright  VHI. All rights reserved.  ARM: Dorsum of Hand to Shoulder - Sweep   Pump or stationary circles _5__ times on back of hand including knuckle spaces and individual fingers if needed working up towards the wrist, then retrace all your steps working back up the forearm, doing both sides; upper outer arm and back to your pathways _2-3_ times each. Then do 5 circles again at uninvolved armpit and involved groin where you started! Good job!! Do __1_ time per day.  Copyright  VHI. All rights reserved.

## 2016-02-15 NOTE — Therapy (Addendum)
[place], [place], [address] Phone: [phone]   Fax:  [phone]  Physical Therapy Treatment  Patient Details  Name: [name] MRN: [id number] Date of Birth: Nov 23, 1945 Referring Provider: Gery Pray, MD  Encounter Date: 02/15/2016      PT End of Session - 02/15/16 1551    Visit Number 2   Number of Visits 9   Date for PT Re-Evaluation 03/16/16   PT Start Time 8088   PT Stop Time 1540   PT Time Calculation (min) 65 min   Activity Tolerance Patient tolerated treatment well   Behavior During Therapy [facility] for tasks assessed/performed      Past Medical History:  Diagnosis Date  . Arthritis   . Asthma   . Cancer [facility])    breast cancer  . Chronic vaginitis   . Fibromyalgia   . GERD (gastroesophageal reflux disease)   . Hypoglycemia   . Hypoglycemia   . Hypothyroidism   . Interstitial cystitis   . Menopausal symptoms   . Osteoporosis   . Pneumonia   . PONV (postoperative nausea and vomiting)   . Thyroid disease    hypothyroid    Past Surgical History:  Procedure Laterality Date  . BLADDER HYPEREXTENSION     X4  . BREAST LUMPECTOMY WITH NEEDLE LOCALIZATION AND AXILLARY LYMPH NODE DISSECTION Right 12/05/2015   Procedure: RIGHT BREAST NEEDLE LOCALIZED (WIRES X 2) LUMPECTOMY WITH AXILLARY LYMPH NODE DISSECTION;  Surgeon: Fanny Skates, MD;  Location: [place];  Service: General;  Laterality: Right;  . BREAST LUMPECTOMY WITH RADIOACTIVE SEED LOCALIZATION Left 12/05/2015   Procedure: RADIOACTIVE SEED GUIDED LEFT BREAST LUMPECTOMY;  Surgeon: Fanny Skates, MD;  Location: [place];  Service: General;  Laterality: Left;  . BUNIONECTOMY    . [place]     DR. INGRAM  . CHOLECYSTECTOMY  01/2006  . COLONOSCOPY    . DENTAL SURGERY     13 dental implants  . LEG SURGERY  06/2004   Broken tibia  . MOUTH SURGERY    . NASAL SINUS SURGERY     X4  . PORT-A-CATH REMOVAL Right 12/05/2015    Procedure: REMOVAL PORT-A-CATH;  Surgeon: Fanny Skates, MD;  Location: [place];  Service: General;  Laterality: Right;  . PORTACATH PLACEMENT Left 09/08/2015   Procedure: INSERTION OF PORT-A-CATH ;  Surgeon: Fanny Skates, MD;  Location: [place];  Service: General;  Laterality: Left;    There were no vitals filed for this visit.      Subjective Assessment - 02/15/16 1441    Subjective I'm very overwhelmed with all of my appointments and I just finished radiation and I'd love to not have alot of appointments for awhile. So maybe I can learn the massage and not have to come back much.    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 She was treated with neoadjuvant chemotherapy and had a right and left lumpectomy with right axillary lymph node dissection on 12/05/15, with 15 out of 17 lymph nodes being positive. She is currently undergoing radiation. She has a history of fibromyalgia and osteoporosis.   Patient Stated Goals to prevent lymphedema   Currently in Pain? No/denies               LYMPHEDEMA/ONCOLOGY QUESTIONNAIRE - 02/14/16 1502      Lymphedema Assessments   Lymphedema Assessments Upper extremities     Right Upper Extremity Lymphedema  15 cm Proximal to Olecranon Process 31.5 cm   10 cm Proximal to Olecranon Process 29.4 cm   Olecranon Process 24 cm   15 cm Proximal to Ulnar Styloid Process 22 cm   10 cm Proximal to Ulnar Styloid Process 19.3 cm   Just Proximal to Ulnar Styloid Process 14.6 cm   Across Hand at [employer] 18.4 cm   At [place] of 2nd Digit 6.5 cm     Left Upper Extremity Lymphedema   15 cm Proximal to Olecranon Process 31.2 cm   10 cm Proximal to Olecranon Process 29.7 cm   Olecranon Process 23.2 cm   15 cm Proximal to Ulnar Styloid Process 21.1 cm   10 cm Proximal to Ulnar Styloid Process 19 cm   Just Proximal to Ulnar Styloid Process 13.8 cm   Across Hand at [employer] 17.5 cm    At [place] of 2nd Digit 5.9 cm                  [facility] Adult PT Treatment/Exercise - 02/15/16 0001      Self-Care   Other Self-Care Comments  Spent time educating pt on compression sleeves and gauntlets and she will only require a Class I. Also where she can go to get one, like at [facility] as she is already familiar with this from getting wigs. Also spent time further answering pts questions she thought of after yesterdays session about lymphedema risk reduction practices.      Manual Therapy   Manual Lymphatic Drainage (MLD) In Supine: Short neck, 5 diaphragmatic breaths, Rt inguinal and Lt axillary nodes, Rt axillo-inguinal and anterior inter-axillary anastomosis, then Rt UE from dorsal hand to lateral shoulder working from proximal to distal then retracing all steps instructing pt throughout and then having her return demonstration some.                 PT Education - 02/15/16 1544    Education provided Yes   Education Details Self Manual lymph drainage and instructed pt in basic principles of this and anatomy of lymphatic system.   Person(s) Educated Patient   Methods Explanation;Demonstration;Handout;Tactile cues;Verbal cues   Comprehension Verbalized understanding;Returned demonstration;Need further instruction;Tactile cues required                [place] - 02/14/16 1548      CC Long Term Goal  #1   Title Patient will be independent in self manual lymph drainage.   Time 4   Period Weeks   Status New     CC Long Term Goal  #2   Title Patient's family will be independent in compression bandaging.   Time 4   Period Weeks   Status New     CC Long Term Goal  #3   Title Patient will be independent in strength after breast cancer exercise program.   Time 4   Period Weeks   Status New     CC Long Term Goal  #4   Title Patient will report a 50% improvement in the swelling in her right UE.   Time 4   Period Weeks   Status New      CC Long Term Goal  #5   Title Patient will be knowledgeable about compression garments and how to obtain them.   Time 4   Period Weeks   Status New            Plan - 02/15/16  1551    Clinical Impression Statement Instructed pt today in basic principles of manual lymph drainage and had her return demonstration of this which she did fairly well. Mostly required VC for lighter pressure. Also instructed pt in anatomy of lymphatic system to better her understanding of how this treatment helps. Pt was engaged and asking appropriate questions throughout. Also answered her questions regarding a compression sleeve and gauntlet and that we suggested she get one as she has had an ALND and then further radiation to her axilla and her hand has shown signs of swelling intermittently recently. Suggested [facility] to Ms. Hamler as she has already been there for wigs. She would like to come just one time a week for maube only 1-2 more weeks to make sure she is independent with self MLD and to instruct her in the Strength ABC Packet.    Rehab Potential Excellent   Clinical Impairments Affecting Rehab Potential recent right axillary lymph node dissection; currently undergoing radiation treatment   PT Frequency 2x / week  Though pt right now only wants 1x/week   PT Duration 4 weeks   PT Treatment/Interventions ADLs/Self Care Home Management;Therapeutic exercise;Therapeutic activities;DME Instruction;Patient/family education;Manual techniques;Manual lymph drainage;Compression bandaging;Passive range of motion;Taping   PT Next Visit Plan Review with pt self MLD beginning of session and then try to begin instruction of strength after breast cancer program   Consulted and Agree with Plan of Care Patient      Patient will benefit from skilled therapeutic intervention in order to improve the following deficits and impairments:  Decreased skin integrity, Increased edema, Postural dysfunction  Visit  Diagnosis: Lymphedema, not elsewhere classified  Other symptoms and signs involving the musculoskeletal system       G-Codes - 09-Mar-2016 1605    Functional Assessment Tool Used lymphedema life impact scale 4% impairment    Functional Limitation Self care   Self Care Current Status (N3614) At least 1 percent but less than 20 percent impaired, limited or restricted   Self Care Goal Status (E3154) 0 percent impaired, limited or restricted      Problem List Patient Active Problem List   Diagnosis Date Noted  . Family history of breast cancer in female 02/08/2016  . Rash and nonspecific skin eruption 10/03/2015  . Breast cancer of upper-outer quadrant of right female breast ([place]) 09/01/2015  . 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 Menopausal symptoms   . Chronic vaginitis   . Fibromyalgia   . Interstitial cystitis   . UNSPECIFIED HYPOTHYROIDISM 06/14/2009  . PURE HYPERCHOLESTEROLEMIA 06/14/2009  . DEPRESSIVE DISORDER NOT ELSEWHERE CLASSIFIED 06/14/2009  . PERIPHERAL NEUROPATHY 06/14/2009  . ESSENTIAL HYPERTENSION, BENIGN 06/14/2009  . UNSPEC HEMORRHOIDS WITHOUT MENTION COMPLICATION 00/86/7619  . ALLERGIC RHINITIS CAUSE UNSPECIFIED 06/14/2009  . ESOPHAGEAL REFLUX 06/14/2009  . ACUTE GASTRITIS WITHOUT MENTION OF HEMORRHAGE 06/14/2009  . IRRITABLE BOWEL SYNDROME 06/14/2009  . INSOMNIA UNSPECIFIED 06/14/2009  . INTERSTITIAL CYSTITIS 08/27/2007  . VULVOVAGINITIS, CHRONIC 08/27/2007  . Fibromyalgia syndrome 08/27/2007  . Osteoporosis 08/27/2007    Otelia Limes, PTA 02/15/2016, 4:00 PM  [place], [place], [address] Phone: [phone]   Fax:  [phone]  Name: [name] MRN: [id number] Date of Birth: 11-13-1945   PHYSICAL THERAPY DISCHARGE SUMMARY  Visits from Start of Care: 2  Current functional level related to goals / functional outcomes: unknown   Remaining  deficits: Unknown   Education / Equipment: Manual lymph drainage, use  of compression, exrcise  Plan: Patient agrees to discharge.  Patient goals were partially met. Patient is being discharged due to not returning since the last visit.  ?????    Maudry Diego, PT 06/18/17 11:39 AM

## 2016-02-16 ENCOUNTER — Ambulatory Visit
Admission: RE | Admit: 2016-02-16 | Discharge: 2016-02-16 | Disposition: A | Discharge status: Home / Self Care | Payer: Medicare Other | Source: Ambulatory Visit | Attending: Radiation Oncology | Admitting: Radiation Oncology

## 2016-02-16 DIAGNOSIS — Z510 Encounter for antineoplastic radiation therapy: Secondary | ICD-10-CM | POA: Diagnosis not present

## 2016-02-17 ENCOUNTER — Ambulatory Visit
Admission: RE | Admit: 2016-02-17 | Discharge: 2016-02-17 | Disposition: A | Discharge status: Home / Self Care | Payer: Medicare Other | Source: Ambulatory Visit | Attending: Radiation Oncology | Admitting: Radiation Oncology

## 2016-02-17 ENCOUNTER — Telehealth: Payer: Self-pay | Admitting: Genetic Counselor

## 2016-02-17 DIAGNOSIS — Z510 Encounter for antineoplastic radiation therapy: Secondary | ICD-10-CM | POA: Diagnosis not present

## 2016-02-17 NOTE — Telephone Encounter (Signed)
Discussed with [name] that the first part of her genetic test results (the BRCA1/2 [place] Founder Panel) is back and is negative.  None of those three common mutations were found.  I will call her as soon as the last part of her test result comes back.

## 2016-02-20 ENCOUNTER — Encounter: Payer: Medicare Other | Admitting: Physical Therapy

## 2016-02-20 ENCOUNTER — Ambulatory Visit
Admission: RE | Admit: 2016-02-20 | Discharge: 2016-02-20 | Disposition: A | Discharge status: Home / Self Care | Payer: Medicare Other | Source: Ambulatory Visit | Attending: Radiation Oncology | Admitting: Radiation Oncology

## 2016-02-20 DIAGNOSIS — Z510 Encounter for antineoplastic radiation therapy: Secondary | ICD-10-CM | POA: Diagnosis not present

## 2016-02-21 ENCOUNTER — Ambulatory Visit
Admission: RE | Admit: 2016-02-21 | Discharge: 2016-02-21 | Disposition: A | Discharge status: Home / Self Care | Payer: Medicare Other | Source: Ambulatory Visit | Attending: Radiation Oncology | Admitting: Radiation Oncology

## 2016-02-21 ENCOUNTER — Ambulatory Visit: Payer: Medicare Other | Admitting: Radiation Oncology

## 2016-02-21 ENCOUNTER — Telehealth: Payer: Self-pay | Admitting: Oncology

## 2016-02-21 ENCOUNTER — Encounter: Payer: Self-pay | Admitting: Radiation Oncology

## 2016-02-21 VITALS — BP 127/79 | HR 70 | Temp 97.8°F | Resp 18 | Ht 62.0 in | Wt 158.2 lb

## 2016-02-21 DIAGNOSIS — Z510 Encounter for antineoplastic radiation therapy: Secondary | ICD-10-CM | POA: Diagnosis not present

## 2016-02-21 DIAGNOSIS — C50411 Malignant neoplasm of upper-outer quadrant of right female breast: Secondary | ICD-10-CM

## 2016-02-21 DIAGNOSIS — Z170 Estrogen receptor positive status [ER+]: Principal | ICD-10-CM

## 2016-02-21 NOTE — Telephone Encounter (Signed)
Called [name] and let her know that she has an appointment scheduled with Dr. Jana Hakim on 11/2 at 1 pm.  [name] verbalized understanding and agreement.

## 2016-02-21 NOTE — Progress Notes (Signed)
  Radiation Oncology         [phone]) [phone] ________________________________  Name: [name] MRN: [id number]  Date: 02/21/2016  DOB: [birth year]  Weekly Radiation Therapy Management    ICD-9-CM ICD-10-CM   1. Malignant neoplasm of upper-outer quadrant of right breast in female, estrogen receptor positive (HCC) 174.4 C50.411    V86.0 Z17.0         Current Dose: 43.2 Gy     Planned Dose:  61 Gy  Narrative . . . . . . . . The patient presents for routine under treatment assessment.                                   [name] completed 24 fractions to her right breast. Reports having pain to right breast 6/10 to right breast taking Hydrocodone and Oxycodonr besides her fibromyalgia. She also reports having some soreness under her right arm. She is taking Xeloda and continues to have nausea. She reports having fatigue. She reports having an MRI of her brain last week due to vision changes. She said everything was normal. She is using radiaplex 2-3 times a day and has been provided with a refill. The skin on her right breast and upper outer chest is red and an area to upper right back like dermatitis itching and burning. The patient reports that she has a sore on her breast that she has been treating with radiaplex.  She also reports a pain "inside" her breast.               Set-up films were reviewed.                                 The chart was checked. Physical Findings. . .  height is 5\' 2"  (1.575 m) and weight is 158 lb 3.2 oz (71.8 kg). Her oral temperature is 97.8 F (36.6 C). Her blood pressure is 127/79 and her pulse is 70. Her respiration is 18 and oxygen saturation is 99%. . Weight essentially stable.  No significant changes. Lungs clear. Heart regular rhythm and rate. Brisk erythema to the right breast and supraclavicular region, no skin break down.  Impression . . . . . . . The patient is tolerating radiation. Plan . . . . . . . . . . . . Continue treatment as  planned. ________________________________   Blair Promise, PhD, MD  This document serves as a record of services personally performed by Gery Pray, MD. It was created on his behalf by Truddie Hidden, a trained medical scribe. The creation of this record is based on the scribe's personal observations and the provider's statements to them. This document has been checked and approved by the attending provider.

## 2016-02-21 NOTE — Progress Notes (Addendum)
[name] has completed 24 fractions to her right breast.  Reports having pain to right breast 6/10 to right breast taking Hydrocodone and Oxycodonr besides her fibromyalgia.  She also reports having some soreness under her right arm.  She is taking Xeloda and continues to have nausea.  She reports having fatigue.  She reports having an MRI of her brain last week due to vision changes.  She said everything was normal.  She is using radiaplex 2-3 times a day and has been provided with a refill.  The skin on her right breast and upper outer chest is red and an area to upper right back like dermatitis itching and burning. Wt Readings from Last 3 Encounters:  02/21/16 158 lb 3.2 oz (71.8 kg)  02/14/16 159 lb 3.2 oz (72.2 kg)  02/07/16 159 lb 12.8 oz (72.5 kg)  BP 127/79 (BP Location: Left Arm, Patient Position: Sitting, Cuff Size: Normal)   Pulse 70   Temp 97.8 F (36.6 C) (Oral)   Resp 18   Ht 5\' 2"  (1.575 m)   Wt 158 lb 3.2 oz (71.8 kg)   SpO2 99%   BMI 28.94 kg/m

## 2016-02-22 ENCOUNTER — Ambulatory Visit
Admission: RE | Admit: 2016-02-22 | Discharge: 2016-02-22 | Disposition: A | Discharge status: Home / Self Care | Payer: Medicare Other | Source: Ambulatory Visit | Attending: Radiation Oncology | Admitting: Radiation Oncology

## 2016-02-22 DIAGNOSIS — Z510 Encounter for antineoplastic radiation therapy: Secondary | ICD-10-CM | POA: Diagnosis not present

## 2016-02-23 ENCOUNTER — Ambulatory Visit: Payer: Medicare Other

## 2016-02-23 DIAGNOSIS — Z510 Encounter for antineoplastic radiation therapy: Secondary | ICD-10-CM | POA: Diagnosis not present

## 2016-02-24 ENCOUNTER — Telehealth: Payer: Self-pay | Admitting: Genetic Counselor

## 2016-02-24 ENCOUNTER — Ambulatory Visit
Admission: RE | Admit: 2016-02-24 | Discharge: 2016-02-24 | Disposition: A | Discharge status: Home / Self Care | Payer: Medicare Other | Source: Ambulatory Visit | Attending: Radiation Oncology | Admitting: Radiation Oncology

## 2016-02-24 DIAGNOSIS — Z510 Encounter for antineoplastic radiation therapy: Secondary | ICD-10-CM | POA: Diagnosis not present

## 2016-02-24 NOTE — Telephone Encounter (Signed)
Discussed with [name] that her genetic test results were negative for mutations within any of 20 genes on the Breast/Ovarian Cancer Panel through [employer].  Additionally, no uncertain changes were found.  This seems to be a reassuring result for [name], especially since the breast cancer in the family was diagnosed at later ages.  [name] should continue to follow her doctors' recommendations for future cancer screening.  Her daughters and other sister are still at somewhat higher risk for breast cancer, simply due to the family history, and they should make their doctors' aware of this.  Her daughters are already getting mammograms, she reports.  [name] knows she is welcome to call with any questions she may have.  She would like a copy of her results mailed to her.

## 2016-02-27 ENCOUNTER — Ambulatory Visit
Admission: RE | Admit: 2016-02-27 | Discharge: 2016-02-27 | Disposition: A | Discharge status: Home / Self Care | Payer: Medicare Other | Source: Ambulatory Visit | Attending: Radiation Oncology | Admitting: Radiation Oncology

## 2016-02-27 ENCOUNTER — Ambulatory Visit: Payer: Self-pay | Admitting: Genetic Counselor

## 2016-02-27 DIAGNOSIS — Z510 Encounter for antineoplastic radiation therapy: Secondary | ICD-10-CM | POA: Diagnosis not present

## 2016-02-27 DIAGNOSIS — Z809 Family history of malignant neoplasm, unspecified: Secondary | ICD-10-CM

## 2016-02-27 DIAGNOSIS — Z170 Estrogen receptor positive status [ER+]: Secondary | ICD-10-CM

## 2016-02-27 DIAGNOSIS — C50411 Malignant neoplasm of upper-outer quadrant of right female breast: Secondary | ICD-10-CM

## 2016-02-27 DIAGNOSIS — Z803 Family history of malignant neoplasm of breast: Secondary | ICD-10-CM

## 2016-02-27 DIAGNOSIS — Z1379 Encounter for other screening for genetic and chromosomal anomalies: Secondary | ICD-10-CM

## 2016-02-28 ENCOUNTER — Encounter: Payer: Self-pay | Admitting: Radiation Oncology

## 2016-02-28 ENCOUNTER — Ambulatory Visit: Payer: Medicare Other

## 2016-02-28 ENCOUNTER — Ambulatory Visit
Admission: RE | Admit: 2016-02-28 | Discharge: 2016-02-28 | Disposition: A | Discharge status: Home / Self Care | Payer: Medicare Other | Source: Ambulatory Visit | Attending: Radiation Oncology | Admitting: Radiation Oncology

## 2016-02-28 VITALS — BP 131/77 | HR 64 | Temp 97.9°F | Resp 18 | Ht 62.0 in | Wt 159.0 lb

## 2016-02-28 DIAGNOSIS — Z170 Estrogen receptor positive status [ER+]: Principal | ICD-10-CM

## 2016-02-28 DIAGNOSIS — C50411 Malignant neoplasm of upper-outer quadrant of right female breast: Secondary | ICD-10-CM

## 2016-02-28 DIAGNOSIS — Z510 Encounter for antineoplastic radiation therapy: Secondary | ICD-10-CM | POA: Diagnosis not present

## 2016-02-28 MED FILL — XELODA 500 MG TABLET: 500 | 7 days supply | Qty: 42 | Fill #3

## 2016-02-28 NOTE — Progress Notes (Signed)
  Radiation Oncology         [phone]) [phone] ________________________________  Name: [name] MRN: [id number]  Date: 02/28/2016  DOB: 03-Jun-1945  Weekly Radiation Therapy Management    ICD-9-CM ICD-10-CM   1. Malignant neoplasm of upper-outer quadrant of right breast in female, estrogen receptor positive (HCC) 174.4 C50.411    V86.0 Z17.0         Current Dose: 52.2 Gy     Planned Dose:  61 Gy  Narrative . . . . . . . . The patient presents for routine under treatment assessment.                                   Youa completed 29 fractions to her right breast. Reports having pain to right breast 7/10 to right breast taking Hydrocodone and Oxycodone besides her fibromyalgia. She also reports having some soreness under her right arm. She is taking Xeloda and continues to have nausea. She reports having fatigue. She reports having an MRI of her brain last week due to vision changes. She said everything was normal. She is using radiaplex 2-3 times a day and has been provided with a refill. Erythema is noted to the entire right breast, right axilla, right upper back, and upper outer shoulder with burning, stinging, and itching.             Set-up films were reviewed.                                 The chart was checked. Physical Findings. . .  height is 5\' 2"  (1.575 m) and weight is 159 lb (72.1 kg). Her oral temperature is 97.9 F (36.6 C). Her blood pressure is 131/77 and her pulse is 64. Her respiration is 18 and oxygen saturation is 98%. . Weight essentially stable.  No significant changes. Lungs clear. Heart regular rhythm and rate. Brisk erythema to the right breast and supraclavicular region. No moist desquamation or skin break down noted. Impression . . . . . . . The patient is tolerating radiation. Plan . . . . . . . . . . . . Continue treatment as planned. ________________________________   Blair Promise, PhD, MD  This document serves as a record of services  personally performed by Gery Pray, MD. It was created on his behalf by Bethann Humble, a trained medical scribe. The creation of this record is based on the scribe's personal observations and the provider's statements to them. This document has been checked and approved by the attending provider.

## 2016-02-28 NOTE — Progress Notes (Signed)
Forestine has completed 29 fractions to her right breast. Reports having pain to right breast 7/10 to right breast taking Hydrocodone and Oxycodonr besides her fibromyalgia. She also reports having some soreness under her right arm.  Erythema to entire right breast, right axilla, right upper back and upper outer shoulder with burning, stinging and itching.  She is taking Xeloda and continues to have nausea. She reports having fatigue. She reports having an MRI of her brain last week due to vision changes. She said everything was normal. She is using radiaplex 2-3 times a day and has been provided with a refill.  Wt Readings from Last 3 Encounters:  02/28/16 159 lb (72.1 kg)  02/21/16 158 lb 3.2 oz (71.8 kg)  02/14/16 159 lb 3.2 oz (72.2 kg)  BP 131/77   Pulse 64   Temp 97.9 F (36.6 C) (Oral)   Resp 18   Ht 5\' 2"  (1.575 m)   Wt 159 lb (72.1 kg)   SpO2 98%   BMI 29.08 kg/m

## 2016-02-29 ENCOUNTER — Ambulatory Visit: Payer: Medicare Other

## 2016-02-29 DIAGNOSIS — Z510 Encounter for antineoplastic radiation therapy: Secondary | ICD-10-CM | POA: Diagnosis not present

## 2016-03-01 ENCOUNTER — Telehealth: Payer: Self-pay | Admitting: Oncology

## 2016-03-01 ENCOUNTER — Encounter: Payer: Self-pay | Admitting: Radiation Oncology

## 2016-03-01 ENCOUNTER — Other Ambulatory Visit (HOSPITAL_BASED_OUTPATIENT_CLINIC_OR_DEPARTMENT_OTHER): Payer: Medicare Other

## 2016-03-01 ENCOUNTER — Ambulatory Visit
Admission: RE | Admit: 2016-03-01 | Discharge: 2016-03-01 | Disposition: A | Discharge status: Home / Self Care | Payer: Medicare Other | Source: Ambulatory Visit | Attending: Radiation Oncology | Admitting: Radiation Oncology

## 2016-03-01 ENCOUNTER — Ambulatory Visit (HOSPITAL_BASED_OUTPATIENT_CLINIC_OR_DEPARTMENT_OTHER): Payer: Medicare Other | Admitting: Oncology

## 2016-03-01 ENCOUNTER — Encounter: Payer: Self-pay | Admitting: *Deleted

## 2016-03-01 ENCOUNTER — Ambulatory Visit: Payer: Medicare Other

## 2016-03-01 VITALS — BP 125/76 | HR 67 | Temp 98.0°F | Resp 18 | Ht 62.0 in | Wt 158.5 lb

## 2016-03-01 VITALS — BP 136/69 | HR 66 | Temp 97.5°F | Resp 18 | Ht 62.0 in | Wt 158.7 lb

## 2016-03-01 DIAGNOSIS — Z170 Estrogen receptor positive status [ER+]: Secondary | ICD-10-CM

## 2016-03-01 DIAGNOSIS — C50411 Malignant neoplasm of upper-outer quadrant of right female breast: Secondary | ICD-10-CM

## 2016-03-01 DIAGNOSIS — Z510 Encounter for antineoplastic radiation therapy: Secondary | ICD-10-CM | POA: Diagnosis not present

## 2016-03-01 LAB — CBC WITH DIFFERENTIAL/PLATELET
BASO%: 0.7 % (ref 0.0–2.0)
Basophils Absolute: 0 10*3/uL (ref 0.0–0.1)
EOS%: 3 % (ref 0.0–7.0)
Eosinophils Absolute: 0.1 10*3/uL (ref 0.0–0.5)
HCT: 40.5 % (ref 34.8–46.6)
HGB: 13.6 g/dL (ref 11.6–15.9)
LYMPH%: 17.6 % (ref 14.0–49.7)
MCH: 32.4 pg (ref 25.1–34.0)
MCHC: 33.6 g/dL (ref 31.5–36.0)
MCV: 96.6 fL (ref 79.5–101.0)
MONO#: 0.6 10*3/uL (ref 0.1–0.9)
MONO%: 13.3 % (ref 0.0–14.0)
NEUT#: 3 10*3/uL (ref 1.5–6.5)
NEUT%: 65.4 % (ref 38.4–76.8)
PLATELETS: 223 10*3/uL (ref 145–400)
RBC: 4.2 10*6/uL (ref 3.70–5.45)
RDW: 17.4 % — ABNORMAL HIGH (ref 11.2–14.5)
WBC: 4.6 10*3/uL (ref 3.9–10.3)
lymph#: 0.8 10*3/uL — ABNORMAL LOW (ref 0.9–3.3)

## 2016-03-01 LAB — COMPREHENSIVE METABOLIC PANEL
ALT: 21 U/L (ref 0–55)
ANION GAP: 9 meq/L (ref 3–11)
AST: 24 U/L (ref 5–34)
Albumin: 3.8 g/dL (ref 3.5–5.0)
Alkaline Phosphatase: 91 U/L (ref 40–150)
BUN: 13 mg/dL (ref 7.0–26.0)
CHLORIDE: 101 meq/L (ref 98–109)
CO2: 27 meq/L (ref 22–29)
Calcium: 9.6 mg/dL (ref 8.4–10.4)
Creatinine: 0.9 mg/dL (ref 0.6–1.1)
EGFR: 65 mL/min/{1.73_m2} — AB (ref >90)
GLUCOSE: 86 mg/dL (ref 70–140)
Potassium: 3.9 mEq/L (ref 3.5–5.1)
SODIUM: 137 meq/L (ref 136–145)
Total Bilirubin: 0.7 mg/dL (ref 0.20–1.20)
Total Protein: 7.2 g/dL (ref 6.4–8.3)

## 2016-03-01 NOTE — Progress Notes (Addendum)
Shavonda has completed 54fractions to her right breast. Reports having pain to right breast 6/10 to right breast,axilla and back taking Hydrocodone and Oxycodonrbesides her fibromyalgia. She also reports having some soreness under her right arm.  Erythema to entire right breast, right axilla, right upper back and upper outer shoulder with burning, stinging and itching.  She is taking Xeloda and continues to have nausea. She reports having fatigue. She reports having an MRI of her brain last week due to vision changes. She said everything was normal. She is using radiaplex 2-3 times a day and has been provided with a refill on Tuesday.  EOT instructions given for skin see documentation in the education area.  Given card for one month follow up visit with Dr. Sondra Come. Wt Readings from Last 3 Encounters:  03/01/16 158 lb 8 oz (71.9 kg)  02/28/16 159 lb (72.1 kg)  02/21/16 158 lb 3.2 oz (71.8 kg)  BP 125/76   Pulse 67   Temp 98 F (36.7 C) (Oral)   Resp 18   Ht 5\' 2"  (1.575 m)   Wt 158 lb 8 oz (71.9 kg)   SpO2 95%   BMI 28.99 kg/m

## 2016-03-01 NOTE — Telephone Encounter (Signed)
GAVE PATIENT AVS REPORT AND APPOINTMENTS FOR November.  °

## 2016-03-01 NOTE — Progress Notes (Signed)
[place]  Telephone:(336) [phone] Fax:([phone]) [phone]     ID: [name] DOB: 05/03/1945  MR#: [id number]  IOM#:[id number]  Patient Care Team: Seward Carol, MD as PCP - General (Internal Medicine) Chauncey Cruel, MD as Consulting Physician (Oncology) Fanny Skates, MD as Consulting Physician (General Surgery) Luberta Robertson, MD (Radiology) Hennie Duos, MD as Consulting Physician (Rheumatology) Domingo Pulse, MD (Urology) Druscilla Brownie, MD as Consulting Physician (Dermatology) Luberta Mutter, MD as Consulting Physician (Ophthalmology) OTHER MD:  CHIEF COMPLAINT: Locally advanced breast cancer  CURRENT TREATMENT:  adjuvant capecitabine  BREAST CANCER HISTORY: From the original intake note:  [name] had routine mammographic screening in 08/18/2015 at the [facility], showing a right breast mass and possible axillary adenopathy. On 08/23/2015 she underwent bilateral diagnostic mammography with tomography and bilateral breast ultrasonography. The breast density was category B in the right breast there was a spiculated mass in the upper-outer quadrant measuring 1.2 cm and associated with pleomorphic calcifications. There were calcifications extending anteriorly and posteriorly from the mass worrisome for ductal carcinoma in situ. The total area in question is about 3 cm. There were also multiple enlarged right axillary lymph nodes. The mass was palpated as an area of fullness at the 12:00 position of the breast 3 cm from the nipple. There were palpable enlarged right axillary lymph nodes in the right axilla. Ultrasonography of the right breast confirmed an irregular hypoechoic mass in the area in question measuring 1.6 cm. There were multiple abnormal appearing enlarged right axillary lymph nodes, the largest measuring 2.3 cm.  In the left breast, mammography showed an area of architectural distortion associated with the left nipple. This was not palpable.  Ultrasonography of the left breast found no correlate. This area of the left breast was biopsied under tomography 08/24/2015. This showed (SAA [id number]) a complex sclerosing lesion and fibroadenoma.  On 08/23/2015 the patient underwent biopsy of the right breast mass in 1 of the abnormal right axillary lymph nodes. 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 Both biopsies were HER-2 nonamplified, the breast being ratio 1.27 and number per cell 1.90, and the lymph node 1 ratio 1.24 and number per cell 3.0.  On 08/26/2015 the patient underwent bilateral breast MRI. In the left breast there was no suspicious enhancement. The left axilla was benign. In the right breast there was a mass described as in the upper inner quadrant measuring 2.3 cm. Less than a centimeter away from this mass there was a satellite nodule measuring 0.9 cm. In the right axilla there were multiple enlarged right axillary lymph nodes, including a confluent region measuring up to 4 cm. There were also enlarged level II lymph nodes present measuring up to 2.1 cm.  The patient's subsequent history is as detailed below  INTERVAL HISTORY: [name] returns today for follow up of her estrogen receptor positive breast cancer accompanied by her husband [name]. (The patient's daughter [name] also participated by phone). [name] is currently undergoing radiation but is almost done with her treatments, only to left. She has done remarkably well. She has felt a little bit tired but she says she is walking at least a mile every day. She has a little bit of nausea, which she treats with watermelon. She also has Zofran and Compazine. She tells me her skin is holding up well  We also got  the genetics results, which were negative  On 02/07/2016 she had a brain MRI to evaluate her left eye deviation  and diplopia, which is very intermittent. The MRI was very reassuring. 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 There now functioning much better. She has stopped the Restasis drops. She has very little appetite although her sense of taste is not bad she says. She has lost a total of 20 pounds since starting treatment. Of course she continues to have significant pain from her arthritis and fibromyalgia. That is unchanged. She has a runny nose, mild sinus symptoms, and moderate hot flashes. She tells me her cataract seventh grown (likely because of the steroids given with chemotherapy) and she is scheduled for cataract surgery next week. Aside from these issues a detailed review of systems today was stable  PAST MEDICAL HISTORY: Past Medical History:  Diagnosis Date  . Arthritis   . Asthma   . Cancer [facility])    breast cancer  . Chronic vaginitis   . Fibromyalgia   . GERD (gastroesophageal reflux disease)   . Hypoglycemia   . Hypoglycemia   . Hypothyroidism   . Interstitial cystitis   . Menopausal symptoms   . Osteoporosis   . Pneumonia   . PONV (postoperative nausea and vomiting)   . Thyroid disease    hypothyroid    PAST SURGICAL HISTORY: Past Surgical History:  Procedure Laterality Date  . BLADDER HYPEREXTENSION     X4  . BREAST LUMPECTOMY WITH NEEDLE LOCALIZATION AND AXILLARY LYMPH NODE DISSECTION Right 12/05/2015   Procedure: RIGHT BREAST NEEDLE LOCALIZED (WIRES X 2) LUMPECTOMY WITH AXILLARY LYMPH NODE DISSECTION;  Surgeon: Fanny Skates, MD;  Location: [place];  Service: General;  Laterality: Right;  . BREAST LUMPECTOMY WITH RADIOACTIVE SEED LOCALIZATION Left 12/05/2015   Procedure: RADIOACTIVE SEED GUIDED LEFT BREAST LUMPECTOMY;  Surgeon: Fanny Skates, MD;  Location: [place];  Service: General;  Laterality: Left;  . BUNIONECTOMY    .  [place]     DR. INGRAM  . CHOLECYSTECTOMY  01/2006  . COLONOSCOPY    . DENTAL SURGERY     13 dental implants  . LEG SURGERY  06/2004   Broken tibia  . MOUTH SURGERY    . NASAL SINUS SURGERY     X4  . PORT-A-CATH REMOVAL Right 12/05/2015   Procedure: REMOVAL PORT-A-CATH;  Surgeon: Fanny Skates, MD;  Location: [place];  Service: General;  Laterality: Right;  . PORTACATH PLACEMENT Left 09/08/2015   Procedure: INSERTION OF PORT-A-CATH ;  Surgeon: Fanny Skates, MD;  [facility];  Service: General;  Laterality: Left;    FAMILY HISTORY Family History  Problem Relation Age of Onset  . Hypertension Mother   . Heart disease Mother     d. 82  . Parkinsonism Father     d. 78  . Heart disease Sister   . Breast cancer Sister 32    s/p mastectomy; reportedly negative BRCA1/2 testing approx 7 years ago  . Melanoma Sister     d. 58s; hx of incresed sun exposure  . Cancer Sister     dx. sarcoma of her face in her 8s; treated w/ MOHS surgery  . Parkinsonism Paternal Aunt     d. 90s  . Parkinsonism Paternal Uncle     d. late 62s  . Kidney failure Maternal  Grandmother 6  . Heart attack Maternal Grandfather     d. 17s  . Heart disease Paternal Grandmother     d. 3  . Breast cancer Other 88    maternal great aunt (MGF's sister)  . Kidney cancer Paternal Uncle 27    d. 74s; unsure about smoking status  The patient's father died at age 27 from complications of Parkinson's disease. The patient's mother died age 34 with heart disease. Circe had no brothers. She had 2 sisters. One sister was diagnosed with breast cancer at age 88. She was tested for the BRCA gene and was negative. The same sister also had melanoma diagnosed in her 47s. The patient has one maternal great aunt diagnosed with breast cancer around age 60. There is no history of ovarian cancer in the family  GYNECOLOGIC HISTORY:  No LMP recorded. Patient is postmenopausal. Menarche age 51, first live birth age 63. The  patient is GX P2. She stopped having periods in her early 74s and took hormone replacement until her breast cancer diagnosis, April 2017.  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 He has been in remission for more than 10 years. Their daughter [name] lives in [place] were she is Production designer, theatre/television/film of the [employer]. Daughter [name] is an infectious disease M.D. working and teaching at [employer] in [place]. The patient has one granddaughter, 79 months old as of May 2017. The family attends [employer]: Not in place   HEALTH MAINTENANCE: Social History  Substance Use Topics  . Smoking status: Former Smoker    Packs/day: 0.25    Years: 15.00    Types: Cigarettes  . Smokeless tobacco: Never Used     Comment: social smoker/"on and off"  . Alcohol use 0.0 oz/week     Comment: social     Colonoscopy: 2016/ [facility]  PAP: 2015  Bone density:   Lipid panel:  Allergies  Allergen Reactions  . Nitrofurantoin Other (See Comments)    Unknown reaction  . Penicillins Other (See Comments)    Has patient had a PCN reaction causing immediate rash, facial/tongue/throat swelling, SOB or lightheadedness with hypotension: No Has patient had a PCN reaction causing severe rash involving mucus membranes or skin necrosis: No Has patient had a PCN reaction that required hospitalization No Has patient had a PCN reaction occurring within the last 10 years: No If all of the above answers are "NO", then may proceed with Cephalosporin use.  . Chlorhexidine Other (See Comments)    [place] IS OK. The CLOTH WIPE causes reaction. Causes itching all over the area  . Fluconazole Other (See Comments)    REACTION: elavated LFTs - only happened once  . Other Rash    Adhesive tape     Current Outpatient Prescriptions  Medication Sig Dispense Refill  . albuterol (PROVENTIL HFA;VENTOLIN HFA) 108 (90 Base) MCG/ACT inhaler Inhale 1-2 puffs into the lungs every 6 (six) hours as needed for wheezing or shortness of breath.    Marland Kitchen buPROPion (WELLBUTRIN XL) 300 MG 24 hr tablet Take 300 mg by mouth daily.      . calcitonin, salmon, (MIACALCIN/FORTICAL) 200 UNIT/ACT nasal spray Place 1 spray into alternate nostrils daily. 3.7 mL 11  . Calcium Carbonate-Vitamin D (CALCIUM + D PO) Take 1  tablet by mouth at bedtime.     . capecitabine (XELODA) 500 MG tablet Take 3 tablets (1,500 mg total) by mouth 2 (two) times daily after a meal. 42 tablet 6  . carisoprodol (SOMA) 350 MG tablet Take 350 mg by mouth 2 (two) times daily as needed for muscle spasms.   2  . cycloSPORINE (RESTASIS) 0.05 % ophthalmic emulsion Place 1 drop into both eyes 2 (two) times daily. 5.5 mL 3  . diazepam (VALIUM) 5 MG tablet Take 5 mg by mouth every 6 (six) hours as needed for muscle spasms.     . diazepam (VALIUM) 5 MG tablet Take 1 or 2 tablets before MRI study 4 tablet 0  . fluconazole (DIFLUCAN) 100 MG tablet Take 1 tablet (100 mg total) by mouth daily. Take 2 tablets on day 1 8 tablet 0  . fluorometholone (FML) 0.1 % ophthalmic suspension PLACE 1 GTT AEY QD  3  . hyaluronate sodium (RADIAPLEXRX) GEL Apply 1 application topically 2 (two) times daily.    Marland Kitchen HYDROcodone-acetaminophen (NORCO) 10-325 MG tablet Take 1 tablet by mouth every 4 (four) hours as needed for moderate pain or severe pain.    . hydrOXYzine (ATARAX/VISTARIL) 25 MG tablet Take 1 tablet (25 mg total) by mouth 3 (three) times daily as needed for anxiety or itching. 30 tablet 0  . levothyroxine (SYNTHROID) 137 MCG tablet Take 137 mcg by mouth daily.     Marland Kitchen lidocaine-prilocaine (EMLA) cream Apply to affected area once 30 g 3  . LORazepam (ATIVAN) 0.5 MG tablet Take 1 tablet (0.5 mg total) by mouth at bedtime as needed (Nausea or vomiting). 30 tablet 0   . Multiple Vitamin (MULTIVITAMIN) capsule Take 1 capsule by mouth daily.      . nabumetone (RELAFEN) 750 MG tablet Take 750 mg by mouth 2 (two) times daily as needed for mild pain.    Marland Kitchen oxyCODONE (OXY IR/ROXICODONE) 5 MG immediate release tablet Take 1-2 tablets (5-10 mg total) by mouth every 6 (six) hours as needed for severe pain. (Patient not taking: Reported on 03/01/2016) 40 tablet 0  . pantoprazole (PROTONIX) 40 MG tablet Take 40 mg by mouth daily.    Vladimir Faster Glycol-Propyl Glycol (SYSTANE OP) Place 1 drop into both eyes as needed (for dry eyes).    . prochlorperazine (COMPAZINE) 10 MG tablet Take 1 tablet (10 mg total) by mouth every 6 (six) hours as needed (Nausea or vomiting). 30 tablet 1  . tobramycin-dexamethasone (TOBRADEX) ophthalmic solution INT 1 GTT INTO THE RIGHT EYE BID PRN  2  . triamterene-hydrochlorothiazide (DYAZIDE) 37.5-25 MG capsule Take 1 tablet by mouth daily.     . valACYclovir (VALTREX) 500 MG tablet Take 1 tablet (500 mg total) by mouth 2 (two) times daily. 60 tablet 1  . Vitamin D, Ergocalciferol, (DRISDOL) 50000 UNITS CAPS capsule One tablet every other week (Patient taking differently: Take 50,000 Units by mouth every 14 (fourteen) days. One tablet every other week) 10 capsule 3  . zolpidem (AMBIEN) 10 MG tablet Take 10 mg by mouth at bedtime as needed for sleep.      No current facility-administered medications for this visit.     OBJECTIVE: Middle-aged white woman Who appears stated age 18:   03/01/16 1247  BP: 136/69  Pulse: 66  Resp: 18  Temp: 97.5 F (36.4 C)     Body mass index is 29.03 kg/m.    ECOG FS:1 - Symptomatic but completely ambulatory  Sclerae unicteric, EOMs intact and  gaze is conjugate Oropharynx clear and moist No cervical or supraclavicular adenopathy Lungs no rales or rhonchi Heart regular rate and rhythm Abd soft, nontender, positive bowel sounds MSK no focal spinal tenderness, no upper extremity lymphedema Neuro:  nonfocal, well oriented, appropriate affect Breasts: The right breast is status post lumpectomy and currently receiving radiation. There is desquamation in the inframammary fold and axilla. There is erythema throughout the radiation port. This is as expected. There is no evidence of residual or recurrent disease. The left breast is unremarkable  LAB RESULTS:  CMP     Component Value Date/Time   NA 137 03/01/2016 1233   K 3.9 03/01/2016 1233   CL 101 12/15/2015 2044   CO2 27 03/01/2016 1233   GLUCOSE 86 03/01/2016 1233   BUN 13.0 03/01/2016 1233   CREATININE 0.9 03/01/2016 1233   CALCIUM 9.6 03/01/2016 1233   PROT 7.2 03/01/2016 1233   ALBUMIN 3.8 03/01/2016 1233   AST 24 03/01/2016 1233   ALT 21 03/01/2016 1233   ALKPHOS 91 03/01/2016 1233   BILITOT 0.70 03/01/2016 1233   GFRNONAA >60 12/15/2015 2044   GFRAA >60 12/15/2015 2044    INo results found for: SPEP, UPEP  Lab Results  Component Value Date   WBC 4.6 03/01/2016   NEUTROABS 3.0 03/01/2016   HGB 13.6 03/01/2016   HCT 40.5 03/01/2016   MCV 96.6 03/01/2016   PLT 223 03/01/2016      Chemistry      Component Value Date/Time   NA 137 03/01/2016 1233   K 3.9 03/01/2016 1233   CL 101 12/15/2015 2044   CO2 27 03/01/2016 1233   BUN 13.0 03/01/2016 1233   CREATININE 0.9 03/01/2016 1233      Component Value Date/Time   CALCIUM 9.6 03/01/2016 1233   ALKPHOS 91 03/01/2016 1233   AST 24 03/01/2016 1233   ALT 21 03/01/2016 1233   BILITOT 0.70 03/01/2016 1233       No results found for: LABCA2  No components found for: LABCA125  No results for input(s): INR in the last 168 hours.  Urinalysis    Component Value Date/Time   COLORURINE YELLOW 12/15/2015 2208   APPEARANCEUR CLEAR 12/15/2015 2208   LABSPEC 1.014 12/15/2015 2208   PHURINE 6.5 12/15/2015 2208   GLUCOSEU NEGATIVE 12/15/2015 2208   HGBUR NEGATIVE 12/15/2015 2208   BILIRUBINUR NEGATIVE 12/15/2015 2208   KETONESUR NEGATIVE 12/15/2015 2208    PROTEINUR NEGATIVE 12/15/2015 2208   NITRITE NEGATIVE 12/15/2015 2208   LEUKOCYTESUR NEGATIVE 12/15/2015 2208      ELIGIBLE FOR AVAILABLE RESEARCH PROTOCOL: PALLAS  STUDIES: Mr Jeri Cos YN Contrast  Result Date: 02/07/2016 CLINICAL DATA:  70 year old female with breast cancer currently undergoing radiation therapy. Worsening vision in the past 2 months. Numbness upper extremities. Initial encounter. EXAM: MRI HEAD WITHOUT AND WITH CONTRAST TECHNIQUE: Multiplanar, multiecho pulse sequences of the brain and surrounding structures were obtained without and with intravenous contrast. CONTRAST:  73m MULTIHANCE GADOBENATE DIMEGLUMINE 529 MG/ML IV SOLN COMPARISON:  None. FINDINGS: Brain: No acute infarct or intracranial hemorrhage. No intracranial enhancing lesion or bony destructive lesion to suggest the presence of intracranial metastatic disease. Mild chronic microvascular changes. Mild global atrophy without hydrocephalus. Vascular: Major intracranial vascular structures are patent. Skull and upper cervical spine: Cervical spondylotic changes with spinal stenosis C4-5 and C5-6 incompletely assessed. Slightly heterogeneous bone marrow of cervical spine and clivus without osseous destructive lesion. Sinuses/Orbits: No primary orbital abnormality noted. Prior sinus surgery suspected. Minimal partial  opacification anterior aspect left sphenoid sinus and minimal mucosal thickening right maxillary sinus and ethmoid sinus air cells. Minimal partial opacification mastoid air cells bilaterally. Other: Negative. IMPRESSION: No evidence of intracranial metastatic disease. No acute infarct. Mild chronic microvascular changes. Minimal paranasal sinus mucosal thickening and partial opacification mastoid air cells. Cervical spondylotic changes C4-5 and C5-6 incompletely assessed. Electronically Signed   By: Genia Del M.D.   On: 02/07/2016 07:29    ASSESSMENT: 70 y.o. [place] woman status post right breast upper  outer quadrant and right axillary lymph node biopsy 08/23/2015, both positive for a clinical T1 N2, stage IIIA invasive ductal carcinoma, grade 2, strongly estrogen receptor positive, 5% progesterone receptor positive, with an MIB-120-30% and no HER-2 amplification  (a) biopsy of a 0.9 cm Right breast satellite nodule 09/13/2015 showed invasive ductal carcinoma, grade 2, E-cadherin strongly positive, with a prognostic panel identical to the larger right upper quadrant mass  (1) left breast biopsy 08/24/2015 shows a complex sclerosing lesion, excised 12/05/2015  (2) neoadjuvant chemotherapy consisting of cyclophosphamide and docetaxel every 21 days 4, with OnPro support, started 09/12/2015, completed 11/14/2015  (3) status post right lumpectomy and sentinel lymph node dissection 12/05/2015 for a residual pT2 pN3, stage IIIC invasive ductal carcinoma, grade 2, with a close anterior margin (skin)  (4) adjuvant radiation with concurrent capecitabine sensitization,to be competed 03/05/2016  (5) capecitabine to be continued through January 2018 at standard doses  (6) anti-estrogens for 10 years to follow at the completion of local treatment--   (7) genetics testing ([place]kenazi descent) 02/07/2016 through the [place] offered by [facility] Hope Pigeon, MD) i found no deleterious mutations in  ATM, BARD1, BRCA1, BRCA2, BRIP1, CDH1, CHEK2, FANCC, MLH1, MSH2, MSH6, NBN, PALB2, PMS2, PTEN, RAD51C, RAD51D, TP53, and XRCC2.  This panel also includes deletion/duplication analysis (without sequencing) for one gene, EPCAM.  (8) consider PALLAS trial   PLAN: Tazia will complete radiation treatments this week. We are going to increase the dose of capecitabine to an I 50 mg twice daily, 7 days on and 7 days off, but I would like her to have a little time off to recover from radiation before doing that. Likely we will start the capecitabine. Treatment right after Thanksgiving's.  She  has significant residual disease after neoadjuvant chemotherapy. I'm interested in her participating in the [place] trial. She would be randomized to palbociclib plus antiestrogen versus antiestrogen alone. At a minimum she would receive standard of care. She would have a 50% chance of receiving palbociclib, which I think in a few years will be part of the standard of care in someone like her  She received the consent form today and I have encouraged her to read it carefully and make notes so that at the next visit we can clarify any questions. She will need to be enrolled no later than January if she wishes to participate.  I have encouraged her to continue to increase her activity level. I like her current weight--she is pleased about having lost 20 pounds-- in its fine for her to keep it off but I would prefer she not continue to lose. She does have a Diet currently.  She will see me again November 27 and likely we will start the capecitabine the next day. She knows to call for any problems that may develop before the next visit Chauncey Cruel, MD  03/01/2016

## 2016-03-01 NOTE — Progress Notes (Signed)
  Radiation Oncology         [phone] ________________________________  Name: [name] MRN: [id number]  Date: 03/01/2016  DOB: October 06, 1945  Weekly Radiation Therapy Management    ICD-9-CM ICD-10-CM   1. Malignant neoplasm of upper-outer quadrant of right breast in female, estrogen receptor positive (HCC) 174.4 C50.411    V86.0 Z17.0          Current Dose: 57 Gy     Planned Dose:  61 Gy  Narrative . . . . . . . . The patient presents for routine under treatment assessment.              [name] has completed 31 fractions ot her right breast. Reports having pain to the right breast 6/10 to right breast, axilla, and back. She is taking hydrocodone and oxycodone. She also reports having some soreness under her right arm. Per nursing, there is erythema to entire right breast, right axilla, right upper back, and upper outer shoulder with burning, stinging, and itching. She is taking Xeloda and continues to have nausea. She reports having fatigue. She is using radiaplex 2-3 times a day and has been provided with a refill on Tuesday.              Set-up films were reviewed.                                 The chart was checked. Physical Findings. . .  height is 5\' 2"  (1.575 m) and weight is 158 lb 8 oz (71.9 kg). Her oral temperature is 98 F (36.7 C). Her blood pressure is 125/76 and her pulse is 67. Her respiration is 18 and oxygen saturation is 95%. . Weight essentially stable.  No significant changes. Lungs clear. Heart regular rhythm and rate.  Brisk erythema in right supraclavicular region. Diffuse erythema in the right breast but no moist desquamation. Impression . . . . . . . The patient is tolerating radiation. Plan . . . . . . . . . . . . Continue treatment as planned. She will return for follow up in 1 month After completing her last 2 treatments. ________________________________   Blair Promise, PhD, MD  This document serves as a record of services personally performed by  Gery Pray, MD. It was created on his behalf by Arlyce Harman, a trained medical scribe. The creation of this record is based on the scribe's personal observations and the provider's statements to them. This document has been checked and approved by the attending provider.

## 2016-03-02 ENCOUNTER — Ambulatory Visit: Payer: Medicare Other

## 2016-03-02 ENCOUNTER — Ambulatory Visit
Admission: RE | Admit: 2016-03-02 | Discharge: 2016-03-02 | Disposition: A | Discharge status: Home / Self Care | Payer: Medicare Other | Source: Ambulatory Visit | Attending: Radiation Oncology | Admitting: Radiation Oncology

## 2016-03-02 DIAGNOSIS — Z510 Encounter for antineoplastic radiation therapy: Secondary | ICD-10-CM | POA: Diagnosis not present

## 2016-03-03 ENCOUNTER — Ambulatory Visit: Payer: Medicare Other

## 2016-03-05 ENCOUNTER — Ambulatory Visit: Payer: Medicare Other

## 2016-03-05 ENCOUNTER — Ambulatory Visit
Admission: RE | Admit: 2016-03-05 | Discharge: 2016-03-05 | Disposition: A | Discharge status: Home / Self Care | Payer: Medicare Other | Source: Ambulatory Visit | Attending: Radiation Oncology | Admitting: Radiation Oncology

## 2016-03-05 ENCOUNTER — Telehealth: Payer: Self-pay | Admitting: *Deleted

## 2016-03-05 ENCOUNTER — Encounter: Payer: Self-pay | Admitting: *Deleted

## 2016-03-05 ENCOUNTER — Encounter: Payer: Self-pay | Admitting: Radiation Oncology

## 2016-03-05 DIAGNOSIS — Z170 Estrogen receptor positive status [ER+]: Principal | ICD-10-CM

## 2016-03-05 DIAGNOSIS — C50411 Malignant neoplasm of upper-outer quadrant of right female breast: Secondary | ICD-10-CM

## 2016-03-05 DIAGNOSIS — Z510 Encounter for antineoplastic radiation therapy: Secondary | ICD-10-CM | POA: Diagnosis not present

## 2016-03-05 NOTE — Telephone Encounter (Signed)
  Oncology Nurse Navigator Documentation  Navigator Location: [facility] (03/05/16 1300)   )Navigator Encounter Type: Telephone (03/05/16 1300) Telephone: [facility] (03/05/16 1300)                   Patient Visit Type: RadOnc (03/05/16 1300) Treatment Phase: Final Radiation Tx (03/05/16 1300)                            Time Spent with Patient: 15 (03/05/16 1300)

## 2016-03-06 ENCOUNTER — Other Ambulatory Visit: Payer: Self-pay | Admitting: Oncology

## 2016-03-06 DIAGNOSIS — Z510 Encounter for antineoplastic radiation therapy: Secondary | ICD-10-CM | POA: Diagnosis not present

## 2016-03-06 DIAGNOSIS — Z170 Estrogen receptor positive status [ER+]: Principal | ICD-10-CM

## 2016-03-06 DIAGNOSIS — C50411 Malignant neoplasm of upper-outer quadrant of right female breast: Secondary | ICD-10-CM

## 2016-03-06 MED ORDER — RADIAPLEXRX EX GEL
Freq: Once | CUTANEOUS | Status: AC
  Administered 2016-03-06: 11:00:00 via TOPICAL

## 2016-03-06 MED ORDER — SILVER SULFADIAZINE 1 % EX CREA
TOPICAL_CREAM | Freq: Once | CUTANEOUS | Status: AC
  Administered 2016-03-06: 11:00:00 via TOPICAL

## 2016-03-08 NOTE — Progress Notes (Signed)
  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 Main complaints towards the end of treatment was pain to the right breast as a 6/10 for which she took Hydrocodone and Oxycodone, some soreness under her right arm, nausea, and fatigue. There was brisk erythema to the right breast and supraclavicular region with no skin break down. The patient also had mild edema of the right arm and hand.  Plan: The patient has completed radiation treatment. The patient will return to radiation oncology clinic for routine followup in one month. I advised them to call or return sooner if they have any questions or concerns related to their recovery or treatment.  -----------------------------------  Blair Promise, PhD, MD  This document serves as a record of services personally performed by Gery Pray, MD. It was created on his behalf by Darcus Austin, a trained medical scribe. The creation of this record is based on the scribe's personal observations and the provider's statements to them. This document has been checked and approved by the attending provider.

## 2016-03-12 DIAGNOSIS — Z1379 Encounter for other screening for genetic and chromosomal anomalies: Secondary | ICD-10-CM | POA: Insufficient documentation

## 2016-03-12 NOTE — Progress Notes (Signed)
GENETIC TEST RESULT  HPI: [name] was previously seen in the [facility] due to a personal and family history of breast cancer and concerns regarding a hereditary predisposition to cancer. Please refer to our prior cancer genetics clinic note from February 07, 2016 for more information regarding [name]'s medical, social and family histories, and our assessment and recommendations, at the time. [name] recent genetic test results were disclosed to her, as were recommendations warranted by these results. These results and recommendations are discussed in more detail below.  GENETIC TEST RESULTS: At the time of [name]'s visit on 02/07/16, we recommended she pursue genetic testing of the BRCA1/2 Ashkenazi Founder Panel with reflex to the 20-gene Breast/Ovarian Cancer Panel if negative. The BRCA1/2 Ashkenazi Founder Panel was negative through [facility] [employer] [facility], so testing proceeded to the 20-gene Breast/Ovarian Cancer Panel.  The Breast/Ovarian Cancer Panel offered by [facility] Hope Pigeon, MD) includes sequencing and deletion/duplication analysis for the following 19 genes:  ATM, BARD1, BRCA1, BRCA2, BRIP1, CDH1, CHEK2, FANCC, MLH1, MSH2, MSH6, NBN, PALB2, PMS2, PTEN, RAD51C, RAD51D, TP53, and XRCC2.  This panel also includes deletion/duplication analysis (without sequencing) for one gene, EPCAM.  Those results are now back, the report date for which is February 22, 2016.  Genetic testing was normal, and did not reveal a deleterious mutation in these genes.  Additionally, no variants of uncertain significance (VUSes) were found.  The test report will be scanned into EPIC and will be located under the Results Review tab in the Pathology>Molecular Pathology section.   We discussed with [name] that since the current genetic testing is not perfect, it is possible there may be a gene mutation in one of these genes that current testing cannot detect, but that  chance is small. We also discussed, that it is possible that another gene that has not yet been discovered, or that we have not yet tested, is responsible for the cancer diagnoses in the family, and it is, therefore, important to remain in touch with cancer genetics in the future so that we can continue to offer [name] the most up-to-date genetic testing.    CANCER SCREENING RECOMMENDATIONS: This result is reassuring and indicates that [name] likely does not have an increased risk for a future cancer due to a mutation in one of these genes. This normal test also suggests that [name]'s cancer was most likely not due to an inherited predisposition associated with one of these genes.  Most cancers happen by chance and this negative test suggests that her cancer falls into this category.  We, therefore, recommended she continue to follow the cancer management and screening guidelines provided by her oncology and primary healthcare providers.   RECOMMENDATIONS FOR FAMILY MEMBERS: Women in this family might be at some increased risk of developing cancer, over the general population risk, simply due to the family history of cancer. We recommended women in this family have a yearly mammogram beginning at age 52, or 92 years younger than the earliest onset of cancer, an annual clinical breast exam, and perform monthly breast self-exams. Women in this family should also have a gynecological exam as recommended by their primary provider. All family members should have a colonoscopy by age 48.  FOLLOW-UP: Lastly, we discussed with [name] that cancer genetics is a rapidly advancing field and it is possible that new genetic tests will be appropriate for her and/or her family members in the future. We encouraged her to remain  in contact with cancer genetics on an annual basis so we can update her personal and family histories and let her know of advances in cancer genetics that may benefit this family.    Our contact number was provided. [name] questions were answered to her satisfaction, and she knows she is welcome to call us at anytime with additional questions or concerns.   Jeanine Luz, MS, [facility] Certified Genetic Counselor [email or web address] Phone: [phone]

## 2016-03-21 ENCOUNTER — Encounter: Payer: Self-pay | Admitting: Gynecology

## 2016-03-26 ENCOUNTER — Telehealth: Payer: Self-pay | Admitting: Oncology

## 2016-03-26 ENCOUNTER — Other Ambulatory Visit (HOSPITAL_BASED_OUTPATIENT_CLINIC_OR_DEPARTMENT_OTHER): Payer: Medicare Other

## 2016-03-26 ENCOUNTER — Ambulatory Visit (HOSPITAL_BASED_OUTPATIENT_CLINIC_OR_DEPARTMENT_OTHER): Payer: Medicare Other | Admitting: Oncology

## 2016-03-26 ENCOUNTER — Encounter: Payer: Self-pay | Admitting: *Deleted

## 2016-03-26 ENCOUNTER — Telehealth (INDEPENDENT_AMBULATORY_CARE_PROVIDER_SITE_OTHER): Payer: Self-pay | Admitting: Physical Medicine and Rehabilitation

## 2016-03-26 VITALS — BP 113/58 | HR 59 | Temp 97.8°F | Resp 18 | Ht 62.0 in | Wt 158.6 lb

## 2016-03-26 DIAGNOSIS — C50411 Malignant neoplasm of upper-outer quadrant of right female breast: Secondary | ICD-10-CM | POA: Diagnosis present

## 2016-03-26 DIAGNOSIS — Z170 Estrogen receptor positive status [ER+]: Secondary | ICD-10-CM | POA: Diagnosis not present

## 2016-03-26 LAB — COMPREHENSIVE METABOLIC PANEL
ALBUMIN: 3.4 g/dL — AB (ref 3.5–5.0)
ALK PHOS: 101 U/L (ref 40–150)
ALT: 13 U/L (ref 0–55)
ANION GAP: 11 meq/L (ref 3–11)
AST: 19 U/L (ref 5–34)
BILIRUBIN TOTAL: 0.47 mg/dL (ref 0.20–1.20)
BUN: 14.1 mg/dL (ref 7.0–26.0)
CO2: 23 mEq/L (ref 22–29)
Calcium: 9.4 mg/dL (ref 8.4–10.4)
Chloride: 106 mEq/L (ref 98–109)
Creatinine: 0.9 mg/dL (ref 0.6–1.1)
EGFR: 64 mL/min/{1.73_m2} — AB (ref >90)
GLUCOSE: 97 mg/dL (ref 70–140)
Potassium: 3.8 mEq/L (ref 3.5–5.1)
SODIUM: 140 meq/L (ref 136–145)
TOTAL PROTEIN: 6.7 g/dL (ref 6.4–8.3)

## 2016-03-26 LAB — CBC WITH DIFFERENTIAL/PLATELET
BASO%: 1.1 % (ref 0.0–2.0)
Basophils Absolute: 0.1 10*3/uL (ref 0.0–0.1)
EOS ABS: 0.2 10*3/uL (ref 0.0–0.5)
EOS%: 3 % (ref 0.0–7.0)
HCT: 39.8 % (ref 34.8–46.6)
HEMOGLOBIN: 13.2 g/dL (ref 11.6–15.9)
LYMPH%: 15.9 % (ref 14.0–49.7)
MCH: 32.2 pg (ref 25.1–34.0)
MCHC: 33.2 g/dL (ref 31.5–36.0)
MCV: 97.1 fL (ref 79.5–101.0)
MONO#: 0.5 10*3/uL (ref 0.1–0.9)
MONO%: 9.1 % (ref 0.0–14.0)
NEUT%: 70.9 % (ref 38.4–76.8)
NEUTROS ABS: 4.1 10*3/uL (ref 1.5–6.5)
PLATELETS: 228 10*3/uL (ref 145–400)
RBC: 4.1 10*6/uL (ref 3.70–5.45)
RDW: 19.3 % — AB (ref 11.2–14.5)
WBC: 5.8 10*3/uL (ref 3.9–10.3)
lymph#: 0.9 10*3/uL (ref 0.9–3.3)

## 2016-03-26 MED ORDER — CAPECITABINE 500 MG PO TABS: 1500.0000 mg | ORAL_TABLET | Freq: Two times a day (BID) | ORAL | 6 refills | Status: DC

## 2016-03-26 MED ORDER — PROCHLORPERAZINE MALEATE 10 MG PO TABS: 10.0000 mg | ORAL_TABLET | Freq: Four times a day (QID) | ORAL | 1 refills | Status: DC | PRN

## 2016-03-26 MED ORDER — FLUCONAZOLE 100 MG PO TABS
100.0000 mg | ORAL_TABLET | Freq: Every day | ORAL | 2 refills | Status: DC
Start: 2016-03-26 — End: 2016-07-05

## 2016-03-26 NOTE — Progress Notes (Signed)
[facility]  Telephone:(336) [phone] Fax:(336) [phone]     ID: [name] DOB: 12/27/1945  MR#: [id number]  LSL#:[id number]  Patient Care Team: Seward Carol, MD as PCP - General (Internal Medicine) Chauncey Cruel, MD as Consulting Physician (Oncology) Fanny Skates, MD as Consulting Physician (General Surgery) Luberta Robertson, MD (Radiology) Hennie Duos, MD as Consulting Physician (Rheumatology) Domingo Pulse, MD (Urology) Druscilla Brownie, MD as Consulting Physician (Dermatology) Luberta Mutter, MD as Consulting Physician (Ophthalmology) OTHER MD:  CHIEF COMPLAINT: Locally advanced breast cancer  CURRENT TREATMENT:  adjuvant capecitabine  BREAST CANCER HISTORY: From the original intake note:  [name] had routine mammographic screening in 08/18/2015 at [facility], showing a right breast mass and possible axillary adenopathy. On 08/23/2015 she underwent bilateral diagnostic mammography with tomography and bilateral breast ultrasonography. The breast density was category B in the right breast there was a spiculated mass in the upper-outer quadrant measuring 1.2 cm and associated with pleomorphic calcifications. There were calcifications extending anteriorly and posteriorly from the mass worrisome for ductal carcinoma in situ. The total area in question is about 3 cm. There were also multiple enlarged right axillary lymph nodes. The mass was palpated as an area of fullness at the 12:00 position of the breast 3 cm from the nipple. There were palpable enlarged right axillary lymph nodes in the right axilla. Ultrasonography of the right breast confirmed an irregular hypoechoic mass in the area in question measuring 1.6 cm. There were multiple abnormal appearing enlarged right axillary lymph nodes, the largest measuring 2.3 cm.  In the left breast, mammography showed an area of architectural distortion associated with the left nipple. This was not palpable.  Ultrasonography of the left breast found no correlate. This area of the left breast was biopsied under tomography 08/24/2015. This showed (SAA [id number]) a complex sclerosing lesion and fibroadenoma.  On 08/23/2015 the patient underwent biopsy of the right breast mass in 1 of the abnormal right axillary lymph nodes. 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 Both biopsies were HER-2 nonamplified, the breast being ratio 1.27 and number per cell 1.90, and the lymph node 1 ratio 1.24 and number per cell 3.0.  On 08/26/2015 the patient underwent bilateral breast MRI. In the left breast there was no suspicious enhancement. The left axilla was benign. In the right breast there was a mass described as in the upper inner quadrant measuring 2.3 cm. Less than a centimeter away from this mass there was a satellite nodule measuring 0.9 cm. In the right axilla there were multiple enlarged right axillary lymph nodes, including a confluent region measuring up to 4 cm. There were also enlarged level II lymph nodes present measuring up to 2.1 cm.  The patient's subsequent history is as detailed below  INTERVAL HISTORY: [name] returns today for follow up of her locally advanced estrogen receptor positive breast cancer accompanied by her husband [name]. Since her last visit here she completed her radiation treatments. She had significant fatigue and desquamation but she tells me she is recovering well. She tolerated capecitabine at sensitizing doses without any mouth sores, diarrhea, or evidence of palmar plantar erythrodysesthesia.  REVIEW OF SYSTEMS: She had cataract surgery and her lacrimal ducts "cleaned out" so she no longer has epiphora. She denies unusual headaches, visual changes, nausea, vomiting, or gait imbalance. She still has chronic  pain as before. She  denies off, phlegm production, or pleurisy. There has been no change in bowel or bladder habits. A detailed review of systems today was otherwise stable.  PAST MEDICAL HISTORY: Past Medical History:  Diagnosis Date  . Arthritis   . Asthma   . Cancer [facility])    breast cancer  . Chronic vaginitis   . Fibromyalgia   . GERD (gastroesophageal reflux disease)   . Hypoglycemia   . Hypoglycemia   . Hypothyroidism   . Interstitial cystitis   . Menopausal symptoms   . Osteoporosis   . Pneumonia   . PONV (postoperative nausea and vomiting)   . Thyroid disease    hypothyroid    PAST SURGICAL HISTORY: Past Surgical History:  Procedure Laterality Date  . BLADDER HYPEREXTENSION     X4  . BREAST LUMPECTOMY WITH NEEDLE LOCALIZATION AND AXILLARY LYMPH NODE DISSECTION Right 12/05/2015   Procedure: RIGHT BREAST NEEDLE LOCALIZED (WIRES X 2) LUMPECTOMY WITH AXILLARY LYMPH NODE DISSECTION;  Surgeon: Fanny Skates, MD;  Location: [place];  Service: General;  Laterality: Right;  . BREAST LUMPECTOMY WITH RADIOACTIVE SEED LOCALIZATION Left 12/05/2015   Procedure: RADIOACTIVE SEED GUIDED LEFT BREAST LUMPECTOMY;  Surgeon: Fanny Skates, MD;  Location: [place];  Service: General;  Laterality: Left;  . BUNIONECTOMY    . Arab     DR. INGRAM  . CHOLECYSTECTOMY  01/2006  . COLONOSCOPY    . DENTAL SURGERY     13 dental implants  . LEG SURGERY  06/2004   Broken tibia  . MOUTH SURGERY    . NASAL SINUS SURGERY     X4  . PORT-A-CATH REMOVAL Right 12/05/2015   Procedure: REMOVAL PORT-A-CATH;  Surgeon: Fanny Skates, MD;  Location: [place];  Service: General;  Laterality: Right;  . PORTACATH PLACEMENT Left 09/08/2015   Procedure: INSERTION OF PORT-A-CATH ;  Surgeon: Fanny Skates, MD;  Location: [facility] OR;  Service: General;  Laterality: Left;    FAMILY HISTORY Family History  Problem Relation Age of Onset  . Hypertension Mother   . Heart disease Mother     d. 64  . Parkinsonism Father     d. 79  .  Heart disease Sister   . Breast cancer Sister 27    s/p mastectomy; reportedly negative BRCA1/2 testing approx 7 years ago  . Melanoma Sister     d. 70s; hx of incresed sun exposure  . Cancer Sister     dx. sarcoma of her face in her 80s; treated w/ MOHS surgery  . Parkinsonism Paternal Aunt     d. 90s  . Parkinsonism Paternal Uncle     d. late 3s  . Kidney failure Maternal Grandmother 16  . Heart attack Maternal Grandfather     d. 72s  . Heart disease Paternal Grandmother     d. 71  . Breast cancer Other 68    maternal great aunt (MGF's sister)  . Kidney cancer Paternal Uncle 87    d. 54s; unsure about smoking status  The patient's father died at age 32 from complications of Parkinson's disease. The patient's mother died age 78 with heart disease. [name] had no brothers. She had 2 sisters. One sister was diagnosed with breast cancer at age 4. She was tested for the BRCA gene and was negative. The same sister also had melanoma diagnosed in her 37s. The patient has one maternal great aunt diagnosed with breast cancer around age 14. There is no history of ovarian  cancer in the family  GYNECOLOGIC HISTORY:  No LMP recorded. Patient is postmenopausal. Menarche age 65, first live birth age 59. The patient is GX P2. She stopped having periods in her early 76s and took hormone replacement until her breast cancer diagnosis, April 2017.  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 He has been in remission for more than 10 years. Their daughter Nira Conn lives in [place] were she is Production designer, theatre/television/film of the [employer]. Daughter Belenda Cruise is an infectious disease M.D. working and teaching at [employer] in [place]. The patient has one granddaughter, 56 months old as of May 2017. The family attends  [employer]: Not in place   HEALTH MAINTENANCE: Social History  Substance Use Topics  . Smoking status: Former Smoker    Packs/day: 0.25    Years: 15.00    Types: Cigarettes  . Smokeless tobacco: Never Used     Comment: social smoker/"on and off"  . Alcohol use 0.0 oz/week     Comment: social     Colonoscopy: 2016/ [facility]  PAP: 2015  Bone density:   Lipid panel:  Allergies  Allergen Reactions  . Nitrofurantoin Other (See Comments)    Unknown reaction  . Penicillins Other (See Comments)    Has patient had a PCN reaction causing immediate rash, facial/tongue/throat swelling, SOB or lightheadedness with hypotension: No Has patient had a PCN reaction causing severe rash involving mucus membranes or skin necrosis: No Has patient had a PCN reaction that required hospitalization No Has patient had a PCN reaction occurring within the last 10 years: No If all of the above answers are "NO", then may proceed with Cephalosporin use.  . Chlorhexidine Other (See Comments)    [place] IS OK. The CLOTH WIPE causes reaction. Causes itching all over the area  . Fluconazole Other (See Comments)    REACTION: elavated LFTs - only happened once  . Other Rash    Adhesive tape    Current Outpatient Prescriptions  Medication Sig Dispense Refill  . albuterol (PROVENTIL HFA;VENTOLIN HFA) 108 (90 Base) MCG/ACT inhaler Inhale 1-2 puffs into the lungs every 6 (six) hours as needed for wheezing or shortness of breath.    Marland Kitchen buPROPion (WELLBUTRIN XL) 300 MG 24 hr tablet Take 300 mg by mouth daily.      . calcitonin, salmon, (MIACALCIN/FORTICAL) 200 UNIT/ACT nasal spray Place 1 spray into alternate nostrils daily. 3.7 mL 11  . Calcium Carbonate-Vitamin D (CALCIUM + D PO) Take 1 tablet by mouth at bedtime.     . capecitabine (XELODA) 500 MG tablet Take 3 tablets (1,500 mg total) by mouth 2 (two) times daily after a meal. 42 tablet 6  . carisoprodol (SOMA) 350 MG tablet Take 350 mg  by mouth 2 (two) times daily as needed for muscle spasms.   2  . cycloSPORINE (RESTASIS) 0.05 % ophthalmic emulsion Place 1 drop into both eyes 2 (two) times daily. 5.5 mL 3  . diazepam (VALIUM) 5 MG tablet Take 5 mg by mouth every 6 (six) hours as needed for muscle spasms.     . diazepam (VALIUM) 5 MG tablet Take 1 or 2 tablets before MRI study 4 tablet 0  . fluconazole (DIFLUCAN) 100 MG tablet Take 1 tablet (100 mg total) by mouth daily.  Take 2 tablets on day 1 8 tablet 0  . fluconazole (DIFLUCAN) 100 MG tablet Take 1 tablet (100 mg total) by mouth daily. 30 tablet 2  . fluorometholone (FML) 0.1 % ophthalmic suspension PLACE 1 GTT AEY QD  3  . HYDROcodone-acetaminophen (NORCO) 10-325 MG tablet Take 1 tablet by mouth every 4 (four) hours as needed for moderate pain or severe pain.    . hydrOXYzine (ATARAX/VISTARIL) 25 MG tablet Take 1 tablet (25 mg total) by mouth 3 (three) times daily as needed for anxiety or itching. 30 tablet 0  . levothyroxine (SYNTHROID) 137 MCG tablet Take 137 mcg by mouth daily.     Marland Kitchen LORazepam (ATIVAN) 0.5 MG tablet Take 1 tablet (0.5 mg total) by mouth at bedtime as needed (Nausea or vomiting). 30 tablet 0  . Multiple Vitamin (MULTIVITAMIN) capsule Take 1 capsule by mouth daily.      . nabumetone (RELAFEN) 750 MG tablet Take 750 mg by mouth 2 (two) times daily as needed for mild pain.    Marland Kitchen oxyCODONE (OXY IR/ROXICODONE) 5 MG immediate release tablet Take 1-2 tablets (5-10 mg total) by mouth every 6 (six) hours as needed for severe pain. (Patient not taking: Reported on 03/01/2016) 40 tablet 0  . pantoprazole (PROTONIX) 40 MG tablet Take 40 mg by mouth daily.    Vladimir Faster Glycol-Propyl Glycol (SYSTANE OP) Place 1 drop into both eyes as needed (for dry eyes).    . prochlorperazine (COMPAZINE) 10 MG tablet Take 1 tablet (10 mg total) by mouth every 6 (six) hours as needed (Nausea or vomiting). 30 tablet 1  . silver sulfADIAZINE (SILVADENE) 1 % cream Apply 1 application  topically 2 (two) times daily.    Marland Kitchen triamterene-hydrochlorothiazide (DYAZIDE) 37.5-25 MG capsule Take 1 tablet by mouth daily.     . valACYclovir (VALTREX) 500 MG tablet Take 1 tablet (500 mg total) by mouth 2 (two) times daily. 60 tablet 1  . Vitamin D, Ergocalciferol, (DRISDOL) 50000 UNITS CAPS capsule One tablet every other week (Patient taking differently: Take 50,000 Units by mouth every 14 (fourteen) days. One tablet every other week) 10 capsule 3  . zolpidem (AMBIEN) 10 MG tablet Take 10 mg by mouth at bedtime as needed for sleep.      No current facility-administered medications for this visit.     OBJECTIVE: Middle-aged white woman In no acute distress  Vitals:   03/26/16 1143  BP: (!) 113/58  Pulse: (!) 59  Resp: 18  Temp: 97.8 F (36.6 C)     Body mass index is 29.01 kg/m.    ECOG FS:1 - Symptomatic but completely ambulatory  Here growing only patchyly, with glabrous areas Sclerae unicteric, EOMs intact Oropharynx clear and moist-- no thrush or other lesions No cervical or supraclavicular adenopathy Lungs no rales or rhonchi Heart regular rate and rhythm Abd soft, nontender, positive bowel sounds MSK no focal spinal tenderness, no upper extremity lymphedema Neuro: nonfocal, well oriented, appropriate affect Breasts: The right breast is status post lumpectomy and radiation. There is some residual hyperpigmentation, but no desquamation. There is some skin thickening and as expected. There is no evidence of residual or recurrent disease. The right axilla is benign. The left breast is unremarkable  LAB RESULTS:  CMP     Component Value Date/Time   NA 140 03/26/2016 1123   K 3.8 03/26/2016 1123   CL 101 12/15/2015 2044   CO2 23 03/26/2016 1123   GLUCOSE 97 03/26/2016 1123   BUN 14.1  03/26/2016 1123   CREATININE 0.9 03/26/2016 1123   CALCIUM 9.4 03/26/2016 1123   PROT 6.7 03/26/2016 1123   ALBUMIN 3.4 (L) 03/26/2016 1123   AST 19 03/26/2016 1123   ALT 13  03/26/2016 1123   ALKPHOS 101 03/26/2016 1123   BILITOT 0.47 03/26/2016 1123   GFRNONAA >60 12/15/2015 2044   GFRAA >60 12/15/2015 2044    INo results found for: SPEP, UPEP  Lab Results  Component Value Date   WBC 5.8 03/26/2016   NEUTROABS 4.1 03/26/2016   HGB 13.2 03/26/2016   HCT 39.8 03/26/2016   MCV 97.1 03/26/2016   PLT 228 03/26/2016      Chemistry      Component Value Date/Time   NA 140 03/26/2016 1123   K 3.8 03/26/2016 1123   CL 101 12/15/2015 2044   CO2 23 03/26/2016 1123   BUN 14.1 03/26/2016 1123   CREATININE 0.9 03/26/2016 1123      Component Value Date/Time   CALCIUM 9.4 03/26/2016 1123   ALKPHOS 101 03/26/2016 1123   AST 19 03/26/2016 1123   ALT 13 03/26/2016 1123   BILITOT 0.47 03/26/2016 1123       No results found for: LABCA2  No components found for: LABCA125  No results for input(s): INR in the last 168 hours.  Urinalysis    Component Value Date/Time   COLORURINE YELLOW 12/15/2015 2208   APPEARANCEUR CLEAR 12/15/2015 2208   LABSPEC 1.014 12/15/2015 2208   PHURINE 6.5 12/15/2015 2208   GLUCOSEU NEGATIVE 12/15/2015 2208   HGBUR NEGATIVE 12/15/2015 2208   BILIRUBINUR NEGATIVE 12/15/2015 2208   KETONESUR NEGATIVE 12/15/2015 2208   PROTEINUR NEGATIVE 12/15/2015 2208   NITRITE NEGATIVE 12/15/2015 2208   LEUKOCYTESUR NEGATIVE 12/15/2015 2208      ELIGIBLE FOR AVAILABLE RESEARCH PROTOCOL: PALLAS  STUDIES: No results found.  ASSESSMENT: 71 y.o. [place] woman status post right breast upper outer quadrant and right axillary lymph node biopsy 08/23/2015, both positive for a clinical T1 N2, stage IIIA invasive ductal carcinoma, grade 2, strongly estrogen receptor positive, 5% progesterone receptor positive, with an MIB-120-30% and no HER-2 amplification  (a) biopsy of a 0.9 cm Right breast satellite nodule 09/13/2015 showed invasive ductal carcinoma, grade 2, E-cadherin strongly positive, with a prognostic panel identical to the larger  right upper quadrant mass  (1) left breast biopsy 08/24/2015 shows a complex sclerosing lesion, excised 12/05/2015  (2) neoadjuvant chemotherapy consisting of cyclophosphamide and docetaxel every 21 days 4, with OnPro support, started 09/12/2015, completed 11/14/2015  (3) status post right lumpectomy and sentinel lymph node dissection 12/05/2015 for a residual pT2 pN3, stage IIIC invasive ductal carcinoma, grade 2, with a close anterior margin (skin)  (4) adjuvant radiation 01/18/16 - 03/05/16     1) Right breast/nodal areas: 45 Gy in 25 fractions                         2) Right breast boost: 16 Gy in 8 fractions  (5) capecitabine to be continued through January 2018 at standard doses  (6) anti-estrogens for 10 years to follow at the completion of local treatment--   (7) genetics testing (Askenazi descent) 02/07/2016 through the [place] offered by [facility] Hope Pigeon, MD) i found no deleterious mutations in  ATM, BARD1, BRCA1, BRCA2, BRIP1, CDH1, CHEK2, FANCC, MLH1, MSH2, MSH6, NBN, PALB2, PMS2, PTEN, RAD51C, RAD51D, TP53, and XRCC2.  This panel also includes deletion/duplication analysis (without sequencing) for one gene, EPCAM.  (  8) consider PALLAS trial   PLAN: Jalan did well with her radiation and is ready to start adjuvant capecitabine. We are only going to do this through December and January, so as not to interfere with her enrollment in the PALLAS trial, which is of more importance then the slight additional risk reduction we can expect from capecitabine.  Nevertheless on 04/13/2016 she will start this drug at 1.5 grams twice a day taken 7 days on and 7 days off. She will see me again on December 29, which will be the first day of her second cycle and we will check lab work at that time.  I have counseled her to call us if she develops mouth sores, diarrhea, or any skin problems of concern.  I share her anxiety regarding her hair not growing back  yet, but would hold off on Rogaine or similar interventions at least at this point.  She is interested in receiving some epidural treatments for her low back pain and I see no problems with that. We will also set her up for penicillin allergy testing in January  She knows to call for any problems that may develop before her next visit here.  Chauncey Cruel, MD  03/26/2016

## 2016-03-26 NOTE — Telephone Encounter (Signed)
If same symptoms and no new injury etc then ok to eval and repeat same day, need driver, if anything out of ordinary then ov

## 2016-03-26 NOTE — Telephone Encounter (Signed)
Appointments scheduled per 03/26/16 los. A copy of the  AVS report and appointment schedule was given to patient,per 03/26/16 los.  °

## 2016-03-27 NOTE — Progress Notes (Signed)
Just, FYI, Patient's oncologist ok with epidural/spine injections. Has breast CA undergoing tx.

## 2016-03-27 NOTE — Telephone Encounter (Signed)
No precert required for medicare and bcbs supplement

## 2016-04-04 ENCOUNTER — Ambulatory Visit (INDEPENDENT_AMBULATORY_CARE_PROVIDER_SITE_OTHER): Payer: Medicare Other | Admitting: Physical Medicine and Rehabilitation

## 2016-04-04 ENCOUNTER — Encounter: Payer: Self-pay | Admitting: Oncology

## 2016-04-04 VITALS — BP 136/75 | HR 69

## 2016-04-04 DIAGNOSIS — M5416 Radiculopathy, lumbar region: Secondary | ICD-10-CM | POA: Diagnosis not present

## 2016-04-04 DIAGNOSIS — M48062 Spinal stenosis, lumbar region with neurogenic claudication: Secondary | ICD-10-CM | POA: Diagnosis not present

## 2016-04-04 MED ORDER — METHYLPREDNISOLONE ACETATE 80 MG/ML IJ SUSP
80.0000 mg | Freq: Once | INTRAMUSCULAR | Status: AC
  Administered 2016-04-04: 80 mg

## 2016-04-04 MED ORDER — LIDOCAINE HCL (PF) 1 % IJ SOLN: 0.3300 mL | Freq: Once | INTRAMUSCULAR | Status: DC

## 2016-04-04 NOTE — Patient Instructions (Signed)

## 2016-04-04 NOTE — Progress Notes (Signed)
LAURAMAE KNEISLEY - 70 y.o. female MRN [id number]  Date of birth: [birth year]  Office Visit Note: Visit Date: 04/04/2016 PCP: Kandice Hams, MD Referred by: Seward Carol, MD  Subjective: Chief Complaint  Patient presents with  . Lower Back - Pain   HPI: Mrs. Pfahler is a very pleasant 70 year old female with complicated history of chronic low back pain and chronic pain with fibromyalgia. We have seen her off and on over the years with good relief with epidural injection for what is a left hip and leg pain with dysesthesia without much in the way of compression. Left sided low back pain and radiating down back of leg to knee. Worse with standing and walking long periods. Denies numbness. She is present today with her husband run some of the history. Unfortunately since I've seen her last she has been diagnosed with stage III breast cancer and has undergone courses of chemotherapy. She did have lymph node positive biopsies. She tells me that overall she's doing okay and bladder scans of been negative. She did get some complicating factor of what sounds like a peripheral polyneuropathy through the chemotherapy. She states that her feet at times will feel like is wrapped in plastic. Otherwise she's had no new trauma or any other problems with her back rather than just worsening left hip and leg pain. She denies any right-sided complaints. She tells me is very similar what she's had the past just worsening. She said she had a period of inactivity but is now trying to get more active. She states that there is a new grandbaby on the way to do a lot of traveling coming up in the winter months. She has felt weak and tired but not focal weakness. No new bladder or bowel changes. She does not really endorse any fibromyalgia flares..    Review of Systems  Constitutional: Negative for chills, fever, malaise/fatigue and weight loss.  HENT: Negative for hearing loss and sinus pain.   Eyes: Negative for blurred  vision, double vision and photophobia.  Respiratory: Negative for cough and shortness of breath.   Cardiovascular: Negative for chest pain, palpitations and leg swelling.  Gastrointestinal: Negative for abdominal pain, nausea and vomiting.  Genitourinary: Negative for flank pain.  Musculoskeletal: Positive for back pain. Negative for myalgias.  Skin: Negative for itching and rash.  Neurological: Positive for tingling and sensory change. Negative for tremors, focal weakness and weakness.  Endo/Heme/Allergies: Negative.   Psychiatric/Behavioral: Negative for depression.  All other systems reviewed and are negative.  Otherwise per HPI.  Assessment & Plan: Visit Diagnoses:  1. Spinal stenosis of lumbar region with neurogenic claudication   2. Lumbar radiculopathy     Plan: Findings:  Chronic worsening low back and left hip and leg pain consistent with radiculitis. MRI findings as of a year and a half ago did not show any compressive lesions but did show some mild arthritis and lateral recess narrowing. I have felt like her symptoms are radicular in nature just from a clinical standpoint and I think going along with the fibromyalgia she just hasn't extrasensory perception of this pathology. She does get good relief with epidural injections and we did repeat 1 today. The injection was done today due to the severity of the symptoms. 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 I spent more than 25 minutes speaking face-to-face with the patient with 50% of the time in  counseling.    Meds & Orders:  Meds ordered this encounter  Medications  . lidocaine (PF) (XYLOCAINE) 1 % injection 0.3 mL  . methylPREDNISolone acetate (DEPO-MEDROL) injection 80 mg    Orders Placed This Encounter  Procedures  . Epidural Steroid injection    Follow-up: Return if symptoms worsen or fail  to improve after 2 weeks.   Procedures: No procedures performed  No notes on file   Clinical History: No specialty comments available.  She reports that she has quit smoking. Her smoking use included Cigarettes. She has a 3.75 pack-year smoking history. She has never used smokeless tobacco. No results for input(s): HGBA1C, LABURIC in the last 8760 hours.  Objective:  VS:  HT:    WT:   BMI:     BP:136/75  HR:69bpm  TEMP: ( )  RESP:96 % Physical Exam  Constitutional: She is oriented to person, place, and time. She appears well-developed and well-nourished.  Eyes: Conjunctivae and EOM are normal. Pupils are equal, round, and reactive to light.  Cardiovascular: Normal rate and intact distal pulses.   Pulmonary/Chest: Effort normal.  Musculoskeletal:  The patient ambulates without aid. She has good distal strength without deficit. No pain with hip rotation. No pain over the greater trochanter as well as she does have some tender spots along the greater trochanters and PSIS.  Neurological: She is alert and oriented to person, place, and time. A sensory deficit is present.  Skin: Skin is warm and dry. No rash noted. No erythema.  Psychiatric: She has a normal mood and affect. Her behavior is normal.  Nursing note and vitals reviewed.   Ortho Exam Imaging: No results found.  Past Medical/Family/Surgical/Social History: Medications & Allergies reviewed per EMR Patient Active Problem List   Diagnosis Date Noted  . Genetic testing 03/12/2016  . Family history of breast cancer in female 02/08/2016  . Rash and nonspecific skin eruption 10/03/2015  . Breast cancer of upper-outer quadrant of right female breast ([place]) 09/01/2015  . 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 Menopausal symptoms   . Chronic vaginitis   . Fibromyalgia   . Interstitial cystitis   . UNSPECIFIED HYPOTHYROIDISM 06/14/2009  . PURE HYPERCHOLESTEROLEMIA 06/14/2009  . 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 PERIPHERAL NEUROPATHY 06/14/2009  . ESSENTIAL HYPERTENSION, BENIGN 06/14/2009  . UNSPEC HEMORRHOIDS WITHOUT MENTION COMPLICATION 72/53/6644  . ALLERGIC RHINITIS CAUSE UNSPECIFIED 06/14/2009  . ESOPHAGEAL REFLUX 06/14/2009  . ACUTE GASTRITIS WITHOUT MENTION OF HEMORRHAGE 06/14/2009  . IRRITABLE BOWEL SYNDROME 06/14/2009  . INSOMNIA UNSPECIFIED 06/14/2009  . INTERSTITIAL CYSTITIS 08/27/2007  . VULVOVAGINITIS, CHRONIC 08/27/2007  . Fibromyalgia syndrome 08/27/2007  . Osteoporosis 08/27/2007   Past Medical History:  Diagnosis Date  . Arthritis   . Asthma   . Cancer [facility]    breast cancer  . Chronic vaginitis   . Fibromyalgia   . GERD (gastroesophageal reflux disease)   . History of radiation therapy 01/18/16-03/05/16   right breast/nodal areas 45 Gy, right breast boost 16 Gy  . Hypoglycemia   . Hypoglycemia   . Hypothyroidism   . Interstitial cystitis   . Menopausal symptoms   . Osteoporosis   . Pneumonia   . PONV (postoperative nausea and vomiting)   . Thyroid disease    hypothyroid   Family History  Problem Relation Age of Onset  . Hypertension Mother   . Heart disease Mother     d. 66  . Parkinsonism Father     d. 92  .  Heart disease Sister   . Breast cancer Sister 61    s/p mastectomy; reportedly negative BRCA1/2 testing approx 7 years ago  . Melanoma Sister     d. 52s; hx of incresed sun exposure  . Cancer Sister     dx. sarcoma of her face in her 76s; treated w/ MOHS surgery  . Parkinsonism Paternal Aunt     d. 90s  . Parkinsonism Paternal Uncle     d. late 71s  . Kidney failure Maternal Grandmother 93  . Heart attack Maternal Grandfather     d. 29s  . Heart disease Paternal Grandmother     d. 68  . Breast cancer Other 26    maternal great aunt (MGF's sister)  . Kidney cancer Paternal Uncle 45    d. 89s; unsure about smoking status   Past Surgical History:  Procedure Laterality Date  . BLADDER HYPEREXTENSION     X4  . BREAST  LUMPECTOMY WITH NEEDLE LOCALIZATION AND AXILLARY LYMPH NODE DISSECTION Right 12/05/2015   Procedure: RIGHT BREAST NEEDLE LOCALIZED (WIRES X 2) LUMPECTOMY WITH AXILLARY LYMPH NODE DISSECTION;  Surgeon: Fanny Skates, MD;  Location: [place];  Service: General;  Laterality: Right;  . BREAST LUMPECTOMY WITH RADIOACTIVE SEED LOCALIZATION Left 12/05/2015   Procedure: RADIOACTIVE SEED GUIDED LEFT BREAST LUMPECTOMY;  Surgeon: Fanny Skates, MD;  Location: [place];  Service: General;  Laterality: Left;  . BUNIONECTOMY    . [place]     DR. INGRAM  . CHOLECYSTECTOMY  01/2006  . COLONOSCOPY    . DENTAL SURGERY     13 dental implants  . LEG SURGERY  06/2004   Broken tibia  . MOUTH SURGERY    . NASAL SINUS SURGERY     X4  . PORT-A-CATH REMOVAL Right 12/05/2015   Procedure: REMOVAL PORT-A-CATH;  Surgeon: Fanny Skates, MD;  Location: [place];  Service: General;  Laterality: Right;  . PORTACATH PLACEMENT Left 09/08/2015   Procedure: INSERTION OF PORT-A-CATH ;  Surgeon: Fanny Skates, MD;  Location: [place];  Service: General;  Laterality: Left;   Social History   Occupational History  . Not on file.   Social History Main Topics  . Smoking status: Former Smoker    Packs/day: 0.25    Years: 15.00    Types: Cigarettes  . Smokeless tobacco: Never Used     Comment: social smoker/"on and off"  . Alcohol use 0.0 oz/week     Comment: social  . Drug use: No  . Sexual activity: Yes    Birth control/ protection: Post-menopausal

## 2016-04-05 ENCOUNTER — Ambulatory Visit
Admission: RE | Admit: 2016-04-05 | Discharge: 2016-04-05 | Disposition: A | Discharge status: Home / Self Care | Payer: Medicare Other | Source: Ambulatory Visit | Attending: Radiation Oncology | Admitting: Radiation Oncology

## 2016-04-05 ENCOUNTER — Encounter: Payer: Self-pay | Admitting: Radiation Oncology

## 2016-04-05 DIAGNOSIS — Z923 Personal history of irradiation: Secondary | ICD-10-CM | POA: Diagnosis not present

## 2016-04-05 DIAGNOSIS — Z880 Allergy status to penicillin: Secondary | ICD-10-CM | POA: Insufficient documentation

## 2016-04-05 DIAGNOSIS — Z79899 Other long term (current) drug therapy: Secondary | ICD-10-CM | POA: Insufficient documentation

## 2016-04-05 DIAGNOSIS — Z170 Estrogen receptor positive status [ER+]: Secondary | ICD-10-CM | POA: Insufficient documentation

## 2016-04-05 DIAGNOSIS — C50411 Malignant neoplasm of upper-outer quadrant of right female breast: Secondary | ICD-10-CM | POA: Diagnosis not present

## 2016-04-05 DIAGNOSIS — Z5189 Encounter for other specified aftercare: Secondary | ICD-10-CM | POA: Diagnosis present

## 2016-04-05 MED FILL — XELODA 500 MG TABLET: 500 | 7 days supply | Qty: 42 | Fill #0

## 2016-04-05 NOTE — Progress Notes (Signed)
Radiation Oncology         [phone] ________________________________  Name: [name] MRN: [id number]  Date: 04/05/2016  DOB: 05/22/1945  Follow-Up Visit Note  CC: POLITE,RONALD D, MD  Magrinat, Gustav C, MD    ICD-9-CM ICD-10-CM   1. 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 Patient reports having a steroid shot in her left mid back yesterday. She currently denies pain, or lymphedema. She reports fatigue. She will restart Xeloda next week. Per nursing, mild erythema noted on the right breast. The skin on her right shoulder and subclavian area is also erythematous.                       ALLERGIES:  is allergic to nitrofurantoin; penicillins; chlorhexidine; and other.  Meds: Current Outpatient Prescriptions  Medication Sig Dispense Refill  . albuterol (PROVENTIL HFA;VENTOLIN HFA) 108 (90 Base) MCG/ACT inhaler Inhale 1-2 puffs into the lungs every 6 (six) hours as needed for wheezing or shortness of breath.    . buPROPion (WELLBUTRIN XL) 300 MG 24 hr tablet Take 300 mg by mouth daily.      . calcitonin, salmon, (MIACALCIN/FORTICAL) 200 UNIT/ACT nasal spray Place 1 spray into alternate nostrils daily. 3.7 mL 11  . Calcium Carbonate-Vitamin D (CALCIUM + D PO) Take 1 tablet by mouth at bedtime.     . capecitabine (XELODA) 500 MG tablet Take 3 tablets (1,500 mg total) by mouth 2 (two) times daily after a meal. 42 tablet 6  . carisoprodol (SOMA) 350 MG tablet Take 350 mg by mouth 2 (two) times daily as needed for muscle spasms.   2  . cycloSPORINE (RESTASIS) 0.05 % ophthalmic emulsion Place 1 drop into both eyes 2  (two) times daily. 5.5 mL 3  . diazepam (VALIUM) 5 MG tablet Take 5 mg by mouth every 6 (six) hours as needed for muscle spasms.     . diazepam (VALIUM) 5 MG tablet Take 1 or 2 tablets before MRI study 4 tablet 0  . fluconazole (DIFLUCAN) 100 MG tablet Take 1 tablet (100 mg total) by mouth daily. Take 2 tablets on day 1 8 tablet 0  . fluconazole (DIFLUCAN) 100 MG tablet Take 1 tablet (100 mg total) by mouth daily. 30 tablet 2  . fluorometholone (FML) 0.1 % ophthalmic suspension PLACE 1 GTT AEY QD  3  . HYDROcodone-acetaminophen (NORCO) 10-325 MG tablet Take 1 tablet by mouth every 4 (four) hours as needed for moderate pain or severe pain.    . hydrOXYzine (ATARAX/VISTARIL) 25 MG tablet Take 1 tablet (25 mg total) by mouth 3 (three) times daily as needed for anxiety or itching. 30 tablet 0  . levothyroxine (SYNTHROID) 137 MCG tablet Take 137 mcg by mouth daily.     . LORazepam (ATIVAN) 0.5 MG tablet Take 1 tablet (0.5 mg total) by mouth at bedtime as needed (Nausea or vomiting). 30 tablet 0  . Multiple Vitamin (MULTIVITAMIN) capsule Take 1 capsule by mouth daily.      . nabumetone (RELAFEN) 750 MG tablet Take 750 mg by mouth 2 (two) times daily   as needed for mild pain.    . pantoprazole (PROTONIX) 40 MG tablet Take 40 mg by mouth daily.    . Polyethyl Glycol-Propyl Glycol (SYSTANE OP) Place 1 drop into both eyes as needed (for dry eyes).    . prochlorperazine (COMPAZINE) 10 MG tablet Take 1 tablet (10 mg total) by mouth every 6 (six) hours as needed (Nausea or vomiting). 30 tablet 1  . triamterene-hydrochlorothiazide (DYAZIDE) 37.5-25 MG capsule Take 1 tablet by mouth daily.     . valACYclovir (VALTREX) 500 MG tablet Take 1 tablet (500 mg total) by mouth 2 (two) times daily. 60 tablet 1  . Vitamin D, Ergocalciferol, (DRISDOL) 50000 UNITS CAPS capsule One tablet every other week (Patient taking differently: Take 50,000 Units by mouth every 14 (fourteen) days. One tablet every other week) 10 capsule 3    . zolpidem (AMBIEN) 10 MG tablet Take 10 mg by mouth at bedtime as needed for sleep.     . oxyCODONE (OXY IR/ROXICODONE) 5 MG immediate release tablet Take 1-2 tablets (5-10 mg total) by mouth every 6 (six) hours as needed for severe pain. (Patient not taking: Reported on 04/05/2016) 40 tablet 0  . silver sulfADIAZINE (SILVADENE) 1 % cream Apply 1 application topically 2 (two) times daily.     Current Facility-Administered Medications  Medication Dose Route Frequency Provider Last Rate Last Dose  . lidocaine (PF) (XYLOCAINE) 1 % injection 0.3 mL  0.3 mL Other Once Frederic Newton, MD        Physical Findings: The patient is in no acute distress. Patient is alert and oriented.  height is 5' 2" (1.575 m) and weight is 156 lb 12.8 oz (71.1 kg). Her oral temperature is 98.3 F (36.8 C). Her blood pressure is 136/84 and her pulse is 85. Her oxygen saturation is 96%. .  No significant changes. Lungs are clear to auscultation bilaterally. Heart has regular rate and rhythm. No palpable cervical, supraclavicular, or axillary adenopathy. Abdomen soft, non-tender, normal bowel sounds.  No palpable mass or nipple discharge in the left breast. Right breast patient continues to have some edema in the breast, she has hyperpigmentation changes. No dominant mass appreciated in the breast, nipple discharge, or bleeding. Lab Findings: Lab Results  Component Value Date   WBC 5.8 03/26/2016   HGB 13.2 03/26/2016   HCT 39.8 03/26/2016   MCV 97.1 03/26/2016   PLT 228 03/26/2016    Radiographic Findings: No results found.  Impression:  The patient is recovering from the effects of radiation.  No evidence of recurrence on clinical exam today.  Plan:  Follow up with radiation oncology in 3 months.  ____________________________________    This document serves as a record of services personally performed by James Kinard, MD. It was created on his behalf by Leslie Hoyt, a trained medical scribe. The  creation of this record is based on the scribe's personal observations and the provider's statements to them. This document has been checked and approved by the attending provider.  

## 2016-04-05 NOTE — Procedures (Signed)
Lumbosacral Transforaminal Epidural Steroid Injection - Infraneural Approach with Fluoroscopic Guidance  Patient: [name]      Date of Birth: 01-14-1946 MRN: [id number] PCP: Kandice Hams, MD      Visit Date: 04/04/2016   Universal Protocol:    Date/Time: 12/07/171:29 PM  Consent Given By: the patient  Position: PRONE   Additional Comments: Vital signs were monitored before and after the procedure. Patient was prepped and draped in the usual sterile fashion. The correct patient, procedure, and site was verified.   Injection Procedure Details:  Procedure Site One Meds Administered:  Meds ordered this encounter  Medications  . lidocaine (PF) (XYLOCAINE) 1 % injection 0.3 mL  . methylPREDNISolone acetate (DEPO-MEDROL) injection 80 mg      Laterality: Left  Location/Site:  L4-L5  Needle size: 22 G  Needle type: Spinal  Needle Placement: Transforaminal  Findings:  -Contrast Used: 1 mL iohexol 180 mg iodine/mL   -Comments: Excellent flow of contrast along the nerve and into the epidural space.  Procedure Details: After squaring off the end-plates of the desired vertebral level to get a true AP view, the C-arm was obliqued to the painful side so that the superior articulating process is positioned about 1/3 the length of the inferior endplate.  The needle was aimed toward the junction of the superior articular process and the transverse process of the inferior vertebrae. The needle's initial entry is in the lower third of the foramen through Kambin's triangle. The soft tissues overlying this target were infiltrated with 2-3 ml. of 1% Lidocaine without Epinephrine.  The spinal needle was then inserted and advanced toward the target using a "trajectory" view along the fluoroscope beam.  Under AP and lateral visualization, the needle was advanced so it did not puncture dura and did not traverse medially beyond the 6 o'clock position of the pedicle. Bi-planar projections  were used to confirm position. Aspiration was confirmed to be negative for CSF and/or blood. A 1-2 ml. volume of Isovue-250 was injected and flow of contrast was noted at each level. Radiographs were obtained for documentation purposes.   After attaining the desired flow of contrast documented above, a 0.5 to 1.0 ml test dose of 0.25% Marcaine was injected into each respective transforaminal space.  The patient was observed for 90 seconds post injection.  After no sensory deficits were reported, and normal lower extremity motor function was noted,   the above injectate was administered so that equal amounts of the injectate were placed at each foramen (level) into the transforaminal epidural space.   Additional Comments:  The patient tolerated the procedure well Dressing: Band-Aid    Post-procedure details: Patient was observed during the procedure. Post-procedure instructions were reviewed.  Patient left the clinic in stable condition.

## 2016-04-05 NOTE — Progress Notes (Signed)
[name] is here for follow up after treatment to her right breast.  She denies having pain.  She had a steroid shot in her left mid back yesterday.  She reports feeling fatigued.  She will restart Xeloda next week.  The skin on her right breast is pink. The skin on her right shoulder and subclavian area is also pink.  BP 136/84 (BP Location: Left Arm, Patient Position: Sitting)   Pulse 85   Temp 98.3 F (36.8 C) (Oral)   Ht 5\' 2"  (1.575 m)   Wt 156 lb 12.8 oz (71.1 kg)   SpO2 96%   BMI 28.68 kg/m    Wt Readings from Last 3 Encounters:  04/05/16 156 lb 12.8 oz (71.1 kg)  03/26/16 158 lb 9.6 oz (71.9 kg)  03/01/16 158 lb 8 oz (71.9 kg)

## 2016-04-18 ENCOUNTER — Other Ambulatory Visit: Payer: Self-pay | Admitting: Gynecology

## 2016-04-19 MED FILL — XELODA 500 MG TABLET: 500 | 7 days supply | Qty: 42 | Fill #1

## 2016-04-27 ENCOUNTER — Encounter: Payer: Self-pay | Admitting: *Deleted

## 2016-04-27 ENCOUNTER — Other Ambulatory Visit (HOSPITAL_BASED_OUTPATIENT_CLINIC_OR_DEPARTMENT_OTHER): Payer: Medicare Other

## 2016-04-27 ENCOUNTER — Ambulatory Visit (HOSPITAL_BASED_OUTPATIENT_CLINIC_OR_DEPARTMENT_OTHER): Payer: Medicare Other | Admitting: Oncology

## 2016-04-27 VITALS — BP 139/84 | HR 69 | Temp 97.8°F | Resp 18 | Ht 62.0 in | Wt 153.2 lb

## 2016-04-27 DIAGNOSIS — C50411 Malignant neoplasm of upper-outer quadrant of right female breast: Secondary | ICD-10-CM

## 2016-04-27 DIAGNOSIS — Z170 Estrogen receptor positive status [ER+]: Secondary | ICD-10-CM | POA: Diagnosis not present

## 2016-04-27 LAB — CBC WITH DIFFERENTIAL/PLATELET
BASO%: 0.2 % (ref 0.0–2.0)
Basophils Absolute: 0 10*3/uL (ref 0.0–0.1)
EOS%: 1.4 % (ref 0.0–7.0)
Eosinophils Absolute: 0.1 10*3/uL (ref 0.0–0.5)
HCT: 38.5 % (ref 34.8–46.6)
HGB: 13.1 g/dL (ref 11.6–15.9)
LYMPH%: 20.6 % (ref 14.0–49.7)
MCH: 33.6 pg (ref 25.1–34.0)
MCHC: 34 g/dL (ref 31.5–36.0)
MCV: 98.7 fL (ref 79.5–101.0)
MONO#: 0.3 10*3/uL (ref 0.1–0.9)
MONO%: 4.3 % (ref 0.0–14.0)
NEUT%: 73.5 % (ref 38.4–76.8)
NEUTROS ABS: 4.6 10*3/uL (ref 1.5–6.5)
Platelets: 232 10*3/uL (ref 145–400)
RBC: 3.9 10*6/uL (ref 3.70–5.45)
RDW: 15.5 % — ABNORMAL HIGH (ref 11.2–14.5)
WBC: 6.3 10*3/uL (ref 3.9–10.3)
lymph#: 1.3 10*3/uL (ref 0.9–3.3)

## 2016-04-27 LAB — COMPREHENSIVE METABOLIC PANEL
ALT: 11 U/L (ref 0–55)
AST: 15 U/L (ref 5–34)
Albumin: 4 g/dL (ref 3.5–5.0)
Alkaline Phosphatase: 96 U/L (ref 40–150)
Anion Gap: 11 mEq/L (ref 3–11)
BILIRUBIN TOTAL: 0.8 mg/dL (ref 0.20–1.20)
BUN: 13.5 mg/dL (ref 7.0–26.0)
CHLORIDE: 100 meq/L (ref 98–109)
CO2: 27 meq/L (ref 22–29)
Calcium: 9.5 mg/dL (ref 8.4–10.4)
Creatinine: 0.9 mg/dL (ref 0.6–1.1)
EGFR: 65 mL/min/{1.73_m2} — AB (ref >90)
GLUCOSE: 95 mg/dL (ref 70–140)
Potassium: 3.9 mEq/L (ref 3.5–5.1)
SODIUM: 138 meq/L (ref 136–145)
TOTAL PROTEIN: 7 g/dL (ref 6.4–8.3)

## 2016-04-27 MED FILL — XELODA 500 MG TABLET: 500 | 7 days supply | Qty: 42 | Fill #2

## 2016-04-27 NOTE — Progress Notes (Signed)
[place]  Telephone:(336) [phone] Fax:(336) [phone]     ID: [name] DOB: 08/15/1945  MR#: [id number]  GXQ#:[id number]  Patient Care Team: Seward Carol, MD as PCP - General (Internal Medicine) Chauncey Cruel, MD as Consulting Physician (Oncology) Fanny Skates, MD as Consulting Physician (General Surgery) Luberta Robertson, MD (Radiology) Hennie Duos, MD as Consulting Physician (Rheumatology) Domingo Pulse, MD (Urology) Druscilla Brownie, MD as Consulting Physician (Dermatology) Luberta Mutter, MD as Consulting Physician (Ophthalmology) Magnus Sinning, MD as Consulting Physician (Physical Medicine and Rehabilitation) Benson Norway, RN as Registered Nurse OTHER MD:  CHIEF COMPLAINT: Locally advanced breast cancer  CURRENT TREATMENT:  adjuvant capecitabine  BREAST CANCER HISTORY: From the original intake note:  [name] had routine mammographic screening in 08/18/2015 at the [facility], showing a right breast mass and possible axillary adenopathy. On 08/23/2015 she underwent bilateral diagnostic mammography with tomography and bilateral breast ultrasonography. The breast density was category B in the right breast there was a spiculated mass in the upper-outer quadrant measuring 1.2 cm and associated with pleomorphic calcifications. There were calcifications extending anteriorly and posteriorly from the mass worrisome for ductal carcinoma in situ. The total area in question is about 3 cm. There were also multiple enlarged right axillary lymph nodes. The mass was palpated as an area of fullness at the 12:00 position of the breast 3 cm from the nipple. There were palpable enlarged right axillary lymph nodes in the right axilla. Ultrasonography of the right breast confirmed an irregular hypoechoic mass in the area in question measuring 1.6 cm. There were multiple abnormal appearing enlarged right axillary lymph nodes, the largest measuring 2.3 cm.  In the left  breast, mammography showed an area of architectural distortion associated with the left nipple. This was not palpable. Ultrasonography of the left breast found no correlate. This area of the left breast was biopsied under tomography 08/24/2015. This showed (SAA [id number]) a complex sclerosing lesion and fibroadenoma.  On 08/23/2015 the patient underwent biopsy of the right breast mass in 1 of the abnormal right axillary lymph nodes. 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 Both biopsies were HER-2 nonamplified, the breast being ratio 1.27 and number per cell 1.90, and the lymph node 1 ratio 1.24 and number per cell 3.0.  On 08/26/2015 the patient underwent bilateral breast MRI. In the left breast there was no suspicious enhancement. The left axilla was benign. In the right breast there was a mass described as in the upper inner quadrant measuring 2.3 cm. Less than a centimeter away from this mass there was a satellite nodule measuring 0.9 cm. In the right axilla there were multiple enlarged right axillary lymph nodes, including a confluent region measuring up to 4 cm. There were also enlarged level II lymph nodes present measuring up to 2.1 cm.  The patient's subsequent history is as detailed below  INTERVAL HISTORY: [name] returns today for follow up of her estrogen receptor positive breast cancer accompanied by her husband [name]. She is currently on adjuvant capecitabine. She was instructed to take this 1 week on and then be off a week, and actually she proceeded to take it for 14 days, beginning 04/13/2016. She has tolerated this remarkably well, with no diarrhea, no palmar plantar erythrodysesthesia, and only minimal soreness in her mouth without overt ulcers.  She met with one  of the research nurses today and is planning to participate in the  PALLAS trial as soon as she completes her capecitabine treatments.  REVIEW OF SYSTEMS: Since her last visit here she had bilateral cataract surgery and is very pleased with the results. She does complain of feeling tired, but she feels this is more likely related to her recent radiation treatments then the chemotherapy. She has had some nausea, which is very inconstant. She uses Compazine for this. There has been no vomiting. Her hair has not started growing back particularly over the crown and she (and I) are becoming increasingly concerned that this might be a long-term problem. Aside from these issues a detailed review of systems today was stable.  PAST MEDICAL HISTORY: Past Medical History:  Diagnosis Date  . Arthritis   . Asthma   . Cancer [facility])    breast cancer  . Chronic vaginitis   . Fibromyalgia   . GERD (gastroesophageal reflux disease)   . History of radiation therapy 01/18/16-03/05/16   right breast/nodal areas 45 Gy, right breast boost 16 Gy  . Hypoglycemia   . Hypoglycemia   . Hypothyroidism   . Interstitial cystitis   . Menopausal symptoms   . Osteoporosis   . Pneumonia   . PONV (postoperative nausea and vomiting)   . Thyroid disease    hypothyroid    PAST SURGICAL HISTORY: Past Surgical History:  Procedure Laterality Date  . BLADDER HYPEREXTENSION     X4  . BREAST LUMPECTOMY WITH NEEDLE LOCALIZATION AND AXILLARY LYMPH NODE DISSECTION Right 12/05/2015   Procedure: RIGHT BREAST NEEDLE LOCALIZED (WIRES X 2) LUMPECTOMY WITH AXILLARY LYMPH NODE DISSECTION;  Surgeon: Fanny Skates, MD;  Location: [place];  Service: General;  Laterality: Right;  . BREAST LUMPECTOMY WITH RADIOACTIVE SEED LOCALIZATION Left 12/05/2015   Procedure: RADIOACTIVE SEED GUIDED LEFT BREAST LUMPECTOMY;  Surgeon: Fanny Skates, MD;  Location: [place];  Service: General;  Laterality: Left;  . BUNIONECTOMY    . [place]     DR. INGRAM  . CHOLECYSTECTOMY  01/2006  . COLONOSCOPY    . DENTAL  SURGERY     13 dental implants  . LEG SURGERY  06/2004   Broken tibia  . MOUTH SURGERY    . NASAL SINUS SURGERY     X4  . PORT-A-CATH REMOVAL Right 12/05/2015   Procedure: REMOVAL PORT-A-CATH;  Surgeon: Fanny Skates, MD;  Location: [place];  Service: General;  Laterality: Right;  . PORTACATH PLACEMENT Left 09/08/2015   Procedure: INSERTION OF PORT-A-CATH ;  Surgeon: Fanny Skates, MD;  Location: [facility];  Service: General;  Laterality: Left;    FAMILY HISTORY Family History  Problem Relation Age of Onset  . Hypertension Mother   . Heart disease Mother     d. 23  . Parkinsonism Father     d. 12  . Heart disease Sister   . Breast cancer Sister 17    s/p mastectomy; reportedly negative BRCA1/2 testing approx 7 years ago  . Melanoma Sister     d. 61s; hx of incresed sun exposure  . Cancer Sister     dx. sarcoma of her face in her 20s; treated w/ MOHS surgery  . Parkinsonism Paternal Aunt     d. 90s  . Parkinsonism Paternal Uncle     d. late 52s  . Kidney failure Maternal Grandmother 21  . Heart attack Maternal Grandfather     d. 63s  . Heart disease Paternal Grandmother  d. 9  . Breast cancer Other 75    maternal great aunt (MGF's sister)  . Kidney cancer Paternal Uncle 58    d. 81s; unsure about smoking status  The patient's father died at age 75 from complications of Parkinson's disease. The patient's mother died age 80 with heart disease. [name] had no brothers. She had 2 sisters. One sister was diagnosed with breast cancer at age 5. She was tested for the BRCA gene and was negative. The same sister also had melanoma diagnosed in her 68s. The patient has one maternal great aunt diagnosed with breast cancer around age 67. There is no history of ovarian cancer in the family  GYNECOLOGIC HISTORY:  No LMP recorded. Patient is postmenopausal. Menarche age 65, first live birth age 48. The patient is GX P2. She stopped having periods in her early 27s and took hormone replacement  until her breast cancer diagnosis, April 2017.  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 He has been in remission for more than 10 years. Their daughter Nira Conn lives in [place] were she is Production designer, theatre/television/film of the [employer]. Daughter Belenda Cruise is an infectious disease M.D. working and teaching at [employer] in [place]. The patient has one granddaughter, 39 months old as of May 2017. The family attends [employer]: Not in place   HEALTH MAINTENANCE: Social History  Substance Use Topics  . Smoking status: Former Smoker    Packs/day: 0.25    Years: 15.00    Types: Cigarettes  . Smokeless tobacco: Never Used     Comment: social smoker/"on and off"  . Alcohol use 0.0 oz/week     Comment: social     Colonoscopy: 2016/ [facility]  PAP: 2015  Bone density:   Lipid panel:  Allergies  Allergen Reactions  . Nitrofurantoin Other (See Comments)    Unknown reaction  . Penicillins Other (See Comments)    Has patient had a PCN reaction causing immediate rash, facial/tongue/throat swelling, SOB or lightheadedness with hypotension: No Has patient had a PCN reaction causing severe rash involving mucus membranes or skin necrosis: No Has patient had a PCN reaction that required hospitalization No Has patient had a PCN reaction occurring within the last 10 years: No If all of the above answers are "NO", then may proceed with Cephalosporin use.  . Chlorhexidine Other (See Comments)    Superior IS OK. The CLOTH WIPE causes reaction. Causes itching all over the area  . Other Rash    Adhesive tape    Current Outpatient Prescriptions  Medication Sig Dispense Refill  . albuterol (PROVENTIL HFA;VENTOLIN HFA) 108 (90 Base) MCG/ACT inhaler Inhale 1-2 puffs into the lungs every  6 (six) hours as needed for wheezing or shortness of breath.    Marland Kitchen buPROPion (WELLBUTRIN XL) 300 MG 24 hr tablet Take 300 mg by mouth daily.      . calcitonin, salmon, (MIACALCIN/FORTICAL) 200 UNIT/ACT nasal spray Place 1 spray into alternate nostrils daily. 3.7 mL 11  . Calcium Carbonate-Vitamin D (CALCIUM + D PO) Take 1 tablet by mouth at bedtime.     . capecitabine (XELODA) 500 MG tablet Take 3 tablets (1,500 mg total) by mouth 2 (two) times daily after a meal. 42 tablet 6  . carisoprodol (SOMA) 350 MG tablet  Take 350 mg by mouth 2 (two) times daily as needed for muscle spasms.   2  . cycloSPORINE (RESTASIS) 0.05 % ophthalmic emulsion Place 1 drop into both eyes 2 (two) times daily. 5.5 mL 3  . diazepam (VALIUM) 5 MG tablet Take 5 mg by mouth every 6 (six) hours as needed for muscle spasms.     . diazepam (VALIUM) 5 MG tablet Take 1 or 2 tablets before MRI study 4 tablet 0  . fluconazole (DIFLUCAN) 100 MG tablet Take 1 tablet (100 mg total) by mouth daily. Take 2 tablets on day 1 8 tablet 0  . fluconazole (DIFLUCAN) 100 MG tablet Take 1 tablet (100 mg total) by mouth daily. 30 tablet 2  . fluorometholone (FML) 0.1 % ophthalmic suspension PLACE 1 GTT AEY QD  3  . HYDROcodone-acetaminophen (NORCO) 10-325 MG tablet Take 1 tablet by mouth every 4 (four) hours as needed for moderate pain or severe pain.    . hydrOXYzine (ATARAX/VISTARIL) 25 MG tablet Take 1 tablet (25 mg total) by mouth 3 (three) times daily as needed for anxiety or itching. 30 tablet 0  . levothyroxine (SYNTHROID) 137 MCG tablet Take 137 mcg by mouth daily.     Marland Kitchen LORazepam (ATIVAN) 0.5 MG tablet Take 1 tablet (0.5 mg total) by mouth at bedtime as needed (Nausea or vomiting). 30 tablet 0  . Multiple Vitamin (MULTIVITAMIN) capsule Take 1 capsule by mouth daily.      . nabumetone (RELAFEN) 750 MG tablet Take 750 mg by mouth 2 (two) times daily as needed for mild pain.    Marland Kitchen oxyCODONE (OXY IR/ROXICODONE) 5 MG immediate release tablet Take  1-2 tablets (5-10 mg total) by mouth every 6 (six) hours as needed for severe pain. (Patient not taking: Reported on 04/05/2016) 40 tablet 0  . pantoprazole (PROTONIX) 40 MG tablet Take 40 mg by mouth daily.    Vladimir Faster Glycol-Propyl Glycol (SYSTANE OP) Place 1 drop into both eyes as needed (for dry eyes).    . prochlorperazine (COMPAZINE) 10 MG tablet Take 1 tablet (10 mg total) by mouth every 6 (six) hours as needed (Nausea or vomiting). 30 tablet 1  . silver sulfADIAZINE (SILVADENE) 1 % cream Apply 1 application topically 2 (two) times daily.    Marland Kitchen triamterene-hydrochlorothiazide (DYAZIDE) 37.5-25 MG capsule Take 1 tablet by mouth daily.     . valACYclovir (VALTREX) 500 MG tablet Take 1 tablet (500 mg total) by mouth 2 (two) times daily. 60 tablet 1  . Vitamin D, Ergocalciferol, (DRISDOL) 50000 UNITS CAPS capsule One tablet every other week (Patient taking differently: Take 50,000 Units by mouth every 14 (fourteen) days. One tablet every other week) 10 capsule 3  . zolpidem (AMBIEN) 10 MG tablet Take 10 mg by mouth at bedtime as needed for sleep.      Current Facility-Administered Medications  Medication Dose Route Frequency Provider Last Rate Last Dose  . lidocaine (PF) (XYLOCAINE) 1 % injection 0.3 mL  0.3 mL Other Once Magnus Sinning, MD        OBJECTIVE: Middle-aged white womanWho appears stated age   1:   04/27/16 1455  BP: 139/84  Pulse: 69  Resp: 18  Temp: 97.8 F (36.6 C)     Body mass index is 28.02 kg/m.    ECOG FS:1 - Symptomatic but completely ambulatory  Sclerae unicteric, pupils round and equal Oropharynx clear and moist-- no thrush or other lesions No cervical or supraclavicular adenopathy Lungs no rales or rhonchi Heart  regular rate and rhythm Abd soft, nontender, positive bowel sounds MSK no focal spinal tenderness, no upper extremity lymphedema Neuro: nonfocal, well oriented, positive affect Breasts: The right breast is status post lumpectomy followed by  radiation with no evidence of local recurrence. There are radiation associated changes including slight hyperpigmentation and some skin thickening particularly around the areola. The right axilla is benign. The left breast is unremarkable   LAB RESULTS:  CMP     Component Value Date/Time   NA 138 04/27/2016 1439   K 3.9 04/27/2016 1439   CL 101 12/15/2015 2044   CO2 27 04/27/2016 1439   GLUCOSE 95 04/27/2016 1439   BUN 13.5 04/27/2016 1439   CREATININE 0.9 04/27/2016 1439   CALCIUM 9.5 04/27/2016 1439   PROT 7.0 04/27/2016 1439   ALBUMIN 4.0 04/27/2016 1439   AST 15 04/27/2016 1439   ALT 11 04/27/2016 1439   ALKPHOS 96 04/27/2016 1439   BILITOT 0.80 04/27/2016 1439   GFRNONAA >60 12/15/2015 2044   GFRAA >60 12/15/2015 2044    INo results found for: SPEP, UPEP  Lab Results  Component Value Date   WBC 6.3 04/27/2016   NEUTROABS 4.6 04/27/2016   HGB 13.1 04/27/2016   HCT 38.5 04/27/2016   MCV 98.7 04/27/2016   PLT 232 04/27/2016      Chemistry      Component Value Date/Time   NA 138 04/27/2016 1439   K 3.9 04/27/2016 1439   CL 101 12/15/2015 2044   CO2 27 04/27/2016 1439   BUN 13.5 04/27/2016 1439   CREATININE 0.9 04/27/2016 1439      Component Value Date/Time   CALCIUM 9.5 04/27/2016 1439   ALKPHOS 96 04/27/2016 1439   AST 15 04/27/2016 1439   ALT 11 04/27/2016 1439   BILITOT 0.80 04/27/2016 1439       No results found for: LABCA2  No components found for: LABCA125  No results for input(s): INR in the last 168 hours.  Urinalysis    Component Value Date/Time   COLORURINE YELLOW 12/15/2015 2208   APPEARANCEUR CLEAR 12/15/2015 2208   LABSPEC 1.014 12/15/2015 2208   PHURINE 6.5 12/15/2015 2208   GLUCOSEU NEGATIVE 12/15/2015 2208   HGBUR NEGATIVE 12/15/2015 2208   BILIRUBINUR NEGATIVE 12/15/2015 2208   KETONESUR NEGATIVE 12/15/2015 2208   PROTEINUR NEGATIVE 12/15/2015 2208   NITRITE NEGATIVE 12/15/2015 2208   LEUKOCYTESUR NEGATIVE 12/15/2015 2208        ELIGIBLE FOR AVAILABLE RESEARCH PROTOCOL: PALLAS  STUDIES: No results found.  ASSESSMENT: 70 y.o. [place] woman status post right breast upper outer quadrant and right axillary lymph node biopsy 08/23/2015, both positive for a clinical T1 N2, stage IIIA invasive ductal carcinoma, grade 2, strongly estrogen receptor positive, 5% progesterone receptor positive, with an MIB-120-30% and no HER-2 amplification  (a) biopsy of a 0.9 cm Right breast satellite nodule 09/13/2015 showed invasive ductal carcinoma, grade 2, E-cadherin strongly positive, with a prognostic panel identical to the larger right upper quadrant mass  (1) left breast biopsy 08/24/2015 shows a complex sclerosing lesion, excised 12/05/2015  (2) neoadjuvant chemotherapy consisting of cyclophosphamide and docetaxel every 21 days 4, with OnPro support, started 09/12/2015, completed 11/14/2015  (3) status post right lumpectomy and sentinel lymph node dissection 12/05/2015 for a residual pT2 pN3, stage IIIC invasive ductal carcinoma, grade 2, with a close anterior margin (skin)  (4) adjuvant radiation 01/18/16 - 11/6/17with capecitabine chemo-sensitization   1) Right breast/nodal areas: 45 Gy in 25 fractions  2) Right breast boost: 16 Gy in 8 fractions  (5) capecitabine Started 04/13/2016,  to be continued through January 2018 at standard doses  (6) anti-estrogens for 10 years to follow at the completion of local treatment--   (7) genetics testing (Askenazi descent) 02/07/2016 through the [place] offered by [facility] Hope Pigeon, MD) i found no deleterious mutations in  ATM, BARD1, BRCA1, BRCA2, BRIP1, CDH1, CHEK2, FANCC, MLH1, MSH2, MSH6, NBN, PALB2, PMS2, PTEN, RAD51C, RAD51D, TP53, and XRCC2.  This panel also includes deletion/duplication analysis (without sequencing) for one gene, EPCAM.  (8) considering PALLAS trial   PLAN: [name] is tolerating the capecitabine  remarkably well. In the [place] trial we generally follow, they in fact used 14 days on and 7 days off which is what she inadvertently dead. We can continue that pattern as she is doing well with that. I have discussed that with our oral chemotherapy pharmacist.  Tamberly is planning a visit to [facility] and [place] in the next few days, and then between February 14 and 06/27/2016 as she will be on a different trip. We will start the palbociclib accordingly early March.  She will start her next capecitabine cycle 05/07/2016, and she will stop taking capecitabine 05/20/2016. She will return to see me the next day, 05/21/2016, and at that point we will decide whether to stop the capecitabine or do one more cycle before formally enrolling in the PALLAS trial. In general I am more interested in her receiving the palbociclib then the capecitabine so if there is any question of the trial closing early or her coming to close to the 1 year We will stop the capecitabine and proceed to enrollment  She knows to call for any problems that may develop before her next visit here.   Chauncey Cruel, MD  04/28/2016

## 2016-04-28 MED ORDER — CAPECITABINE 500 MG PO TABS: 1500.0000 mg | ORAL_TABLET | Freq: Two times a day (BID) | ORAL | 6 refills | Status: DC

## 2016-05-13 ENCOUNTER — Other Ambulatory Visit: Payer: Self-pay | Admitting: Gynecology

## 2016-05-17 MED FILL — XELODA 500 MG TABLET: 500 | 7 days supply | Qty: 42 | Fill #3

## 2016-05-21 ENCOUNTER — Other Ambulatory Visit: Payer: Self-pay | Admitting: *Deleted

## 2016-05-21 ENCOUNTER — Ambulatory Visit (HOSPITAL_BASED_OUTPATIENT_CLINIC_OR_DEPARTMENT_OTHER): Payer: Medicare Other | Admitting: Oncology

## 2016-05-21 ENCOUNTER — Encounter: Payer: Self-pay | Admitting: *Deleted

## 2016-05-21 ENCOUNTER — Other Ambulatory Visit (HOSPITAL_BASED_OUTPATIENT_CLINIC_OR_DEPARTMENT_OTHER): Payer: Medicare Other

## 2016-05-21 VITALS — BP 118/66 | HR 58 | Temp 97.8°F | Resp 16 | Ht 62.0 in | Wt 155.4 lb

## 2016-05-21 DIAGNOSIS — L659 Nonscarring hair loss, unspecified: Secondary | ICD-10-CM

## 2016-05-21 DIAGNOSIS — M545 Low back pain: Secondary | ICD-10-CM

## 2016-05-21 DIAGNOSIS — L271 Localized skin eruption due to drugs and medicaments taken internally: Secondary | ICD-10-CM

## 2016-05-21 DIAGNOSIS — C50411 Malignant neoplasm of upper-outer quadrant of right female breast: Secondary | ICD-10-CM

## 2016-05-21 DIAGNOSIS — Z170 Estrogen receptor positive status [ER+]: Secondary | ICD-10-CM | POA: Diagnosis not present

## 2016-05-21 LAB — CBC WITH DIFFERENTIAL/PLATELET
BASO%: 0.2 % (ref 0.0–2.0)
Basophils Absolute: 0 10*3/uL (ref 0.0–0.1)
EOS%: 1.4 % (ref 0.0–7.0)
Eosinophils Absolute: 0.1 10*3/uL (ref 0.0–0.5)
HEMATOCRIT: 38 % (ref 34.8–46.6)
HEMOGLOBIN: 13.2 g/dL (ref 11.6–15.9)
LYMPH#: 1.1 10*3/uL (ref 0.9–3.3)
LYMPH%: 16.1 % (ref 14.0–49.7)
MCH: 35.8 pg — ABNORMAL HIGH (ref 25.1–34.0)
MCHC: 34.7 g/dL (ref 31.5–36.0)
MCV: 103.3 fL — ABNORMAL HIGH (ref 79.5–101.0)
MONO#: 0.5 10*3/uL (ref 0.1–0.9)
MONO%: 7.7 % (ref 0.0–14.0)
NEUT%: 74.6 % (ref 38.4–76.8)
NEUTROS ABS: 4.9 10*3/uL (ref 1.5–6.5)
PLATELETS: 248 10*3/uL (ref 145–400)
RBC: 3.68 10*6/uL — ABNORMAL LOW (ref 3.70–5.45)
RDW: 15.6 % — AB (ref 11.2–14.5)
WBC: 6.6 10*3/uL (ref 3.9–10.3)

## 2016-05-21 LAB — COMPREHENSIVE METABOLIC PANEL
ALBUMIN: 4 g/dL (ref 3.5–5.0)
ALK PHOS: 96 U/L (ref 40–150)
ALT: 17 U/L (ref 0–55)
ANION GAP: 12 meq/L — AB (ref 3–11)
AST: 22 U/L (ref 5–34)
BILIRUBIN TOTAL: 1.21 mg/dL — AB (ref 0.20–1.20)
BUN: 13 mg/dL (ref 7.0–26.0)
CALCIUM: 9.6 mg/dL (ref 8.4–10.4)
CO2: 24 mEq/L (ref 22–29)
CREATININE: 0.9 mg/dL (ref 0.6–1.1)
Chloride: 101 mEq/L (ref 98–109)
EGFR: 61 mL/min/{1.73_m2} — ABNORMAL LOW (ref >90)
Glucose: 100 mg/dl (ref 70–140)
Potassium: 3.6 mEq/L (ref 3.5–5.1)
Sodium: 136 mEq/L (ref 136–145)
TOTAL PROTEIN: 6.8 g/dL (ref 6.4–8.3)

## 2016-05-21 MED ORDER — ANASTROZOLE 1 MG PO TABS: 1.0000 mg | ORAL_TABLET | Freq: Every day | ORAL | 4 refills | Status: DC

## 2016-05-21 MED ORDER — ANASTROZOLE 1 MG PO TABS
1.0000 mg | ORAL_TABLET | Freq: Every day | ORAL | 4 refills | Status: DC
Start: 2016-05-21 — End: 2016-05-30

## 2016-05-21 NOTE — Progress Notes (Signed)
[name]  [phone] [phone]     ID: [name] DOB: May 19, 1945  MR#: [id number]  JSR#:[id number]  Patient Care Team: Seward Carol, MD as PCP - General (Internal Medicine) Chauncey Cruel, MD as Consulting Physician (Oncology) Fanny Skates, MD as Consulting Physician (General Surgery) Luberta Robertson, MD (Radiology) Hennie Duos, MD as Consulting Physician (Rheumatology) Domingo Pulse, MD (Urology) Druscilla Brownie, MD as Consulting Physician (Dermatology) Luberta Mutter, MD as Consulting Physician (Ophthalmology) Magnus Sinning, MD as Consulting Physician (Physical Medicine and Rehabilitation) Benson Norway, RN as Registered Nurse OTHER MD:  CHIEF COMPLAINT: Locally advanced breast cancer  CURRENT TREATMENT:  adjuvant capecitabine  BREAST CANCER HISTORY: From the original intake note:  [name] had routine mammographic screening in 08/18/2015 at the [facility], showing a right breast mass and possible axillary adenopathy. On 08/23/2015 she underwent bilateral diagnostic mammography with tomography and bilateral breast ultrasonography. The breast density was category B in the right breast there was a spiculated mass in the upper-outer quadrant measuring 1.2 cm and associated with pleomorphic calcifications. There were calcifications extending anteriorly and posteriorly from the mass worrisome for ductal carcinoma in situ. The total area in question is about 3 cm. There were also multiple enlarged right axillary lymph nodes. The mass was palpated as an area of fullness at the 12:00 position of the breast 3 cm from the nipple. There were palpable enlarged right axillary lymph nodes in the right axilla. Ultrasonography of the right breast confirmed an irregular hypoechoic mass in the area in question measuring 1.6 cm. There were multiple abnormal appearing enlarged right axillary lymph nodes, the largest measuring 2.3 cm.  In the left  breast, mammography showed an area of architectural distortion associated with the left nipple. This was not palpable. Ultrasonography of the left breast found no correlate. This area of the left breast was biopsied under tomography 08/24/2015. This showed (SAA [id number]) a complex sclerosing lesion and fibroadenoma.  On 08/23/2015 the patient underwent biopsy of the right breast mass in 1 of the abnormal right axillary lymph nodes. 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 Both biopsies were HER-2 nonamplified, the breast being ratio 1.27 and number per cell 1.90, and the lymph node 1 ratio 1.24 and number per cell 3.0.  On 08/26/2015 the patient underwent bilateral breast MRI. In the left breast there was no suspicious enhancement. The left axilla was benign. In the right breast there was a mass described as in the upper inner quadrant measuring 2.3 cm. Less than a centimeter away from this mass there was a satellite nodule measuring 0.9 cm. In the right axilla there were multiple enlarged right axillary lymph nodes, including a confluent region measuring up to 4 cm. There were also enlarged level II lymph nodes present measuring up to 2.1 cm.  The patient's subsequent history is as detailed below  INTERVAL HISTORY: [name] returns today for follow-up of her estrogen receptor positive breast cancer. She has been on capecitabine and has tolerated it generally well, but has just begun to develop foot and hand peeling. She also has significant nausea, with rare vomiting on this medication. She feels more tired than usual. She has not had diarrhea or mouth sores.Marland Kitchen  REVIEW OF SYSTEMS: She has been to visit her sister in [place] and other family in [place]. She did well  with the trip. She continues to have significant back pain and she is planning  to receive an epidural through Dr. Alfonse Spruce in the near future. Fiber neurology and neuropathy continue to be the big problems for her but she also has some sinus symptoms leading ringing in her ears and runny nose. She feels anxious but not depressed. A detailed review of systems today was otherwise stable  PAST MEDICAL HISTORY: Past Medical History:  Diagnosis Date  . Arthritis   . Asthma   . Cancer [facility])    breast cancer  . Chronic vaginitis   . Fibromyalgia   . GERD (gastroesophageal reflux disease)   . History of radiation therapy 01/18/16-03/05/16   right breast/nodal areas 45 Gy, right breast boost 16 Gy  . Hypoglycemia   . Hypoglycemia   . Hypothyroidism   . Interstitial cystitis   . Menopausal symptoms   . Osteoporosis   . Pneumonia   . PONV (postoperative nausea and vomiting)   . Thyroid disease    hypothyroid    PAST SURGICAL HISTORY: Past Surgical History:  Procedure Laterality Date  . BLADDER HYPEREXTENSION     X4  . BREAST LUMPECTOMY WITH NEEDLE LOCALIZATION AND AXILLARY LYMPH NODE DISSECTION Right 12/05/2015   Procedure: RIGHT BREAST NEEDLE LOCALIZED (WIRES X 2) LUMPECTOMY WITH AXILLARY LYMPH NODE DISSECTION;  Surgeon: Fanny Skates, MD;  Location: [place];  Service: General;  Laterality: Right;  . BREAST LUMPECTOMY WITH RADIOACTIVE SEED LOCALIZATION Left 12/05/2015   Procedure: RADIOACTIVE SEED GUIDED LEFT BREAST LUMPECTOMY;  Surgeon: Fanny Skates, MD;  Location: [place];  Service: General;  Laterality: Left;  . BUNIONECTOMY    . [place] [facility]     DR. INGRAM  . CHOLECYSTECTOMY  01/2006  . COLONOSCOPY    . DENTAL SURGERY     13 dental implants  . LEG SURGERY  06/2004   Broken tibia  . MOUTH SURGERY    . NASAL SINUS SURGERY     X4  . PORT-A-CATH REMOVAL Right 12/05/2015   Procedure: REMOVAL PORT-A-CATH;  Surgeon: Fanny Skates, MD;  Location: [place];  Service: General;  Laterality: Right;  . PORTACATH PLACEMENT Left 09/08/2015   Procedure: INSERTION OF  PORT-A-CATH ;  Surgeon: Fanny Skates, MD;  Location: [facility];  Service: General;  Laterality: Left;    FAMILY HISTORY Family History  Problem Relation Age of Onset  . Hypertension Mother   . Heart disease Mother     d. 86  . Parkinsonism Father     d. 79  . Heart disease Sister   . Breast cancer Sister 47    s/p mastectomy; reportedly negative BRCA1/2 testing approx 7 years ago  . Melanoma Sister     d. 67s; hx of incresed sun exposure  . Cancer Sister     dx. sarcoma of her face in her 75s; treated w/ MOHS surgery  . Parkinsonism Paternal Aunt     d. 90s  . Parkinsonism Paternal Uncle     d. late 42s  . Kidney failure Maternal Grandmother 64  . Heart attack Maternal Grandfather     d. 11s  . Heart disease Paternal Grandmother     d. 61  . Breast cancer Other 75    maternal great aunt (MGF's sister)  . Kidney cancer Paternal Uncle 110    d. 59s; unsure about smoking status  The patient's father died at age 41 from complications of Parkinson's disease. The patient's mother died age 31 with heart disease. [name]  had no brothers. She had 2 sisters. One sister was diagnosed with breast cancer at age 40. She was tested for the BRCA gene and was negative. The same sister also had melanoma diagnosed in her 75s. The patient has one maternal great aunt diagnosed with breast cancer around age 66. There is no history of ovarian cancer in the family  GYNECOLOGIC HISTORY:  No LMP recorded. Patient is postmenopausal. Menarche age 14, first live birth age 64. The patient is GX P2. She stopped having periods in her early 12s and took hormone replacement until her breast cancer diagnosis, April 2017.  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 He has been in remission for more than 10 years. Their  daughter [name] lives in [place] were she is Production designer, theatre/television/film of the [employer]. Daughter [name] is an infectious disease M.D. working and teaching at [employer] in [place]. The patient has one granddaughter, 81 months old as of May 2017. The family attends [employer]: Not in place   HEALTH MAINTENANCE: Social History  Substance Use Topics  . Smoking status: Former Smoker    Packs/day: 0.25    Years: 15.00    Types: Cigarettes  . Smokeless tobacco: Never Used     Comment: social smoker/"on and off"  . Alcohol use 0.0 oz/week     Comment: social     Colonoscopy: 2016/ [facility]  PAP: 2015  Bone density:   Lipid panel:  Allergies  Allergen Reactions  . Nitrofurantoin Other (See Comments)    Unknown reaction  . Penicillins Other (See Comments)    Has patient had a PCN reaction causing immediate rash, facial/tongue/throat swelling, SOB or lightheadedness with hypotension: No Has patient had a PCN reaction causing severe rash involving mucus membranes or skin necrosis: No Has patient had a PCN reaction that required hospitalization No Has patient had a PCN reaction occurring within the last 10 years: No If all of the above answers are "NO", then may proceed with Cephalosporin use.  . Chlorhexidine Other (See Comments)    [place] IS OK. The CLOTH WIPE causes reaction. Causes itching all over the area  . Other Rash    Adhesive tape    Current Outpatient Prescriptions  Medication Sig Dispense Refill  . albuterol (PROVENTIL HFA;VENTOLIN HFA) 108 (90 Base) MCG/ACT inhaler Inhale 1-2 puffs into the lungs every 6 (six) hours as needed for wheezing or shortness of breath.    . anastrozole (ARIMIDEX) 1 MG tablet Take 1 tablet (1 mg total) by mouth daily. 90 tablet 4  . buPROPion (WELLBUTRIN XL) 300 MG 24 hr tablet Take 300 mg by mouth daily.      . calcitonin, salmon, (MIACALCIN/FORTICAL) 200 UNIT/ACT nasal spray Place 1 spray into alternate nostrils  daily. 3.7 mL 11  . Calcium Carbonate-Vitamin D (CALCIUM + D PO) Take 1 tablet by mouth at bedtime.     . capecitabine (XELODA) 500 MG tablet Take 3 tablets (1,500 mg total) by mouth 2 (two) times daily after a meal. 84 tablet 6  . carisoprodol (SOMA) 350 MG tablet Take 350 mg by mouth 2 (two) times daily as needed for muscle spasms.   2  . cycloSPORINE (RESTASIS) 0.05 % ophthalmic emulsion Place 1 drop into both eyes 2 (two) times daily. 5.5  mL 3  . diazepam (VALIUM) 5 MG tablet Take 5 mg by mouth every 6 (six) hours as needed for muscle spasms.     . diazepam (VALIUM) 5 MG tablet Take 1 or 2 tablets before MRI study 4 tablet 0  . fluconazole (DIFLUCAN) 100 MG tablet Take 1 tablet (100 mg total) by mouth daily. Take 2 tablets on day 1 8 tablet 0  . fluconazole (DIFLUCAN) 100 MG tablet Take 1 tablet (100 mg total) by mouth daily. 30 tablet 2  . fluorometholone (FML) 0.1 % ophthalmic suspension PLACE 1 GTT AEY QD  3  . HYDROcodone-acetaminophen (NORCO) 10-325 MG tablet Take 1 tablet by mouth every 4 (four) hours as needed for moderate pain or severe pain.    . hydrOXYzine (ATARAX/VISTARIL) 25 MG tablet Take 1 tablet (25 mg total) by mouth 3 (three) times daily as needed for anxiety or itching. 30 tablet 0  . levothyroxine (SYNTHROID) 137 MCG tablet Take 137 mcg by mouth daily.     Marland Kitchen LORazepam (ATIVAN) 0.5 MG tablet Take 1 tablet (0.5 mg total) by mouth at bedtime as needed (Nausea or vomiting). 30 tablet 0  . Multiple Vitamin (MULTIVITAMIN) capsule Take 1 capsule by mouth daily.      . nabumetone (RELAFEN) 750 MG tablet Take 750 mg by mouth 2 (two) times daily as needed for mild pain.    Marland Kitchen oxyCODONE (OXY IR/ROXICODONE) 5 MG immediate release tablet Take 1-2 tablets (5-10 mg total) by mouth every 6 (six) hours as needed for severe pain. (Patient not taking: Reported on 04/05/2016) 40 tablet 0  . pantoprazole (PROTONIX) 40 MG tablet Take 40 mg by mouth daily.    Vladimir Faster Glycol-Propyl Glycol  (SYSTANE OP) Place 1 drop into both eyes as needed (for dry eyes).    . prochlorperazine (COMPAZINE) 10 MG tablet Take 1 tablet (10 mg total) by mouth every 6 (six) hours as needed (Nausea or vomiting). 30 tablet 1  . silver sulfADIAZINE (SILVADENE) 1 % cream Apply 1 application topically 2 (two) times daily.    Marland Kitchen triamterene-hydrochlorothiazide (DYAZIDE) 37.5-25 MG capsule Take 1 tablet by mouth daily.     . valACYclovir (VALTREX) 500 MG tablet Take 1 tablet (500 mg total) by mouth 2 (two) times daily. 60 tablet 1  . Vitamin D, Ergocalciferol, (DRISDOL) 50000 UNITS CAPS capsule One tablet every other week (Patient taking differently: Take 50,000 Units by mouth every 14 (fourteen) days. One tablet every other week) 10 capsule 3  . zolpidem (AMBIEN) 10 MG tablet Take 10 mg by mouth at bedtime as needed for sleep.      Current Facility-Administered Medications  Medication Dose Route Frequency Provider Last Rate Last Dose  . lidocaine (PF) (XYLOCAINE) 1 % injection 0.3 mL  0.3 mL Other Once Magnus Sinning, MD        OBJECTIVE: Middle-aged white woman In no acute distress  Vitals:   05/21/16 1505  BP: 118/66  Pulse: (!) 58  Resp: 16  Temp: 97.8 F (36.6 C)     Body mass index is 28.42 kg/m.    ECOG FS:1 - Symptomatic but completely ambulatory  Sclerae unicteric, EOMs intact Oropharynx clear and moist No cervical or supraclavicular adenopathy Lungs no rales or rhonchi Heart regular rate and rhythm Abd soft, nontender, positive bowel sounds MSK no focal spinal tenderness, no upper extremity lymphedema Neuro: nonfocal, well oriented, appropriate affect Breasts: The right breast is status post lumpectomy and radiation. There is significant skin thickening and a  little bit of erythema residual medial to the nipple. There is no evidence of local recurrence. Right axilla is benign.the left breast is unremarkable.    LAB RESULTS:  CMP     Component Value Date/Time   NA 136 05/21/2016  1434   K 3.6 05/21/2016 1434   CL 101 12/15/2015 2044   CO2 24 05/21/2016 1434   GLUCOSE 100 05/21/2016 1434   BUN 13.0 05/21/2016 1434   CREATININE 0.9 05/21/2016 1434   CALCIUM 9.6 05/21/2016 1434   PROT 6.8 05/21/2016 1434   ALBUMIN 4.0 05/21/2016 1434   AST 22 05/21/2016 1434   ALT 17 05/21/2016 1434   ALKPHOS 96 05/21/2016 1434   BILITOT 1.21 (H) 05/21/2016 1434   GFRNONAA >60 12/15/2015 2044   GFRAA >60 12/15/2015 2044    INo results found for: SPEP, UPEP  Lab Results  Component Value Date   WBC 6.6 05/21/2016   NEUTROABS 4.9 05/21/2016   HGB 13.2 05/21/2016   HCT 38.0 05/21/2016   MCV 103.3 (H) 05/21/2016   PLT 248 05/21/2016      Chemistry      Component Value Date/Time   NA 136 05/21/2016 1434   K 3.6 05/21/2016 1434   CL 101 12/15/2015 2044   CO2 24 05/21/2016 1434   BUN 13.0 05/21/2016 1434   CREATININE 0.9 05/21/2016 1434      Component Value Date/Time   CALCIUM 9.6 05/21/2016 1434   ALKPHOS 96 05/21/2016 1434   AST 22 05/21/2016 1434   ALT 17 05/21/2016 1434   BILITOT 1.21 (H) 05/21/2016 1434       No results found for: LABCA2  No components found for: LABCA125  No results for input(s): INR in the last 168 hours.  Urinalysis    Component Value Date/Time   COLORURINE YELLOW 12/15/2015 2208   APPEARANCEUR CLEAR 12/15/2015 2208   LABSPEC 1.014 12/15/2015 2208   PHURINE 6.5 12/15/2015 2208   GLUCOSEU NEGATIVE 12/15/2015 2208   HGBUR NEGATIVE 12/15/2015 2208   BILIRUBINUR NEGATIVE 12/15/2015 2208   KETONESUR NEGATIVE 12/15/2015 2208   PROTEINUR NEGATIVE 12/15/2015 2208   NITRITE NEGATIVE 12/15/2015 2208   LEUKOCYTESUR NEGATIVE 12/15/2015 2208      ELIGIBLE FOR AVAILABLE RESEARCH PROTOCOL: PALLAS  STUDIES: No results found.  ASSESSMENT: 71 y.o. [place] woman status post right breast upper outer quadrant and right axillary lymph node biopsy 08/23/2015, both positive for a clinical T1 N2, stage IIIA invasive ductal carcinoma,  grade 2, strongly estrogen receptor positive, 5% progesterone receptor positive, with an MIB-120-30% and no HER-2 amplification  (a) biopsy of a 0.9 cm Right breast satellite nodule 09/13/2015 showed invasive ductal carcinoma, grade 2, E-cadherin strongly positive, with a prognostic panel identical to the larger right upper quadrant mass  (1) left breast biopsy 08/24/2015 shows a complex sclerosing lesion, excised 12/05/2015  (2) neoadjuvant chemotherapy consisting of cyclophosphamide and docetaxel every 21 days 4, with OnPro support, started 09/12/2015, completed 11/14/2015  (3) status post right lumpectomy and sentinel lymph node dissection 12/05/2015 for a residual pT2 pN3, stage IIIC invasive ductal carcinoma, grade 2, with a close anterior margin (skin)  (4) adjuvant radiation 01/18/16 - 11/6/17with capecitabine chemo-sensitization   1) Right breast/nodal areas: 45 Gy in 25 fractions                         2) Right breast boost: 16 Gy in 8 fractions  (5) capecitabine Started 04/13/2016, discontinued January 2018 with mild/moderate [name]  effects developing  (6) anti-estrogens for 10 years to follow at the completion of local treatment--   (7) genetics testing ([place]kenazi descent) 02/07/2016 through the [place] offered by [facility] Hope Pigeon, MD) i found no deleterious mutations in  ATM, BARD1, BRCA1, BRCA2, BRIP1, CDH1, CHEK2, FANCC, MLH1, MSH2, MSH6, NBN, PALB2, PMS2, PTEN, RAD51C, RAD51D, TP53, and XRCC2.  This panel also includes deletion/duplication analysis (without sequencing) for one gene, EPCAM.  (8) considering PALLAS trial   PLAN: Alayziah is beginning to develop significant [name] effects from the capecitabine, particularly some palmar plantar erythrodysesthesia. I think it would be prudent to discontinue this medication at this point, especially as they have a trip abroad planned for next month.  This means she will be ready to start on the PALLAS  trial early March. I will see her the second week in March and hopefully we can enroll her and randomize her in that study at that time.  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  She is now ready to start the anastrozole. I gave her some information on the possible toxicities, [name] effects and complications of this agent. He will call if she develops any of these problems  In preparation for her treatment I wrote her a prescription for ciprofloxacin and a compression sleeve.  She will return to see me the second week in March. She knows to call for any problems that may develop before that visit.   Chauncey Cruel, MD  05/21/2016

## 2016-05-22 ENCOUNTER — Other Ambulatory Visit: Payer: Self-pay | Admitting: Gynecology

## 2016-05-24 ENCOUNTER — Telehealth (INDEPENDENT_AMBULATORY_CARE_PROVIDER_SITE_OTHER): Payer: Self-pay | Admitting: Physical Medicine and Rehabilitation

## 2016-05-24 NOTE — Telephone Encounter (Signed)
Ok x 1

## 2016-05-25 NOTE — Telephone Encounter (Signed)
Left message for patient to call back to schedule.  °

## 2016-05-25 NOTE — Telephone Encounter (Signed)
No prior auth is required.

## 2016-05-30 ENCOUNTER — Other Ambulatory Visit: Payer: Self-pay | Admitting: *Deleted

## 2016-05-30 MED ORDER — ANASTROZOLE 1 MG PO TABS
1.0000 mg | ORAL_TABLET | Freq: Every day | ORAL | 4 refills | Status: DC
Start: 2016-05-30 — End: 2016-12-03

## 2016-06-05 ENCOUNTER — Other Ambulatory Visit: Payer: Self-pay | Admitting: Gynecology

## 2016-06-05 ENCOUNTER — Telehealth: Payer: Self-pay

## 2016-06-05 ENCOUNTER — Other Ambulatory Visit: Payer: Self-pay | Admitting: *Deleted

## 2016-06-05 DIAGNOSIS — C50411 Malignant neoplasm of upper-outer quadrant of right female breast: Secondary | ICD-10-CM

## 2016-06-05 DIAGNOSIS — Z170 Estrogen receptor positive status [ER+]: Principal | ICD-10-CM

## 2016-06-05 NOTE — Telephone Encounter (Signed)
Per Cindy,please move 3/16 lab to 3/8.  She spoke with the patient and she is aware.  A copy of her calendar has been mailed to her.   Roe Wilner

## 2016-06-05 NOTE — Progress Notes (Signed)
Reschedule 3/16 lab to 3/8 at 11:15. Please mail updated appointments to patient.

## 2016-06-07 ENCOUNTER — Encounter (INDEPENDENT_AMBULATORY_CARE_PROVIDER_SITE_OTHER): Payer: Self-pay | Admitting: Physical Medicine and Rehabilitation

## 2016-06-07 ENCOUNTER — Ambulatory Visit (INDEPENDENT_AMBULATORY_CARE_PROVIDER_SITE_OTHER): Payer: Self-pay

## 2016-06-07 ENCOUNTER — Ambulatory Visit (INDEPENDENT_AMBULATORY_CARE_PROVIDER_SITE_OTHER): Payer: Medicare Other | Admitting: Physical Medicine and Rehabilitation

## 2016-06-07 VITALS — BP 129/71 | HR 67

## 2016-06-07 DIAGNOSIS — M5416 Radiculopathy, lumbar region: Secondary | ICD-10-CM | POA: Diagnosis not present

## 2016-06-07 MED ORDER — AZITHROMYCIN 250 MG PO TABS: ORAL_TABLET | ORAL | 0 refills | Status: DC

## 2016-06-07 MED ORDER — LIDOCAINE HCL (PF) 1 % IJ SOLN: 0.3300 mL | Freq: Once | INTRAMUSCULAR | Status: DC

## 2016-06-07 MED ORDER — METHYLPREDNISOLONE ACETATE 80 MG/ML IJ SUSP: 80.0000 mg | Freq: Once | INTRAMUSCULAR | Status: DC

## 2016-06-07 NOTE — Patient Instructions (Signed)

## 2016-06-07 NOTE — Progress Notes (Signed)
[name] - 71 y.o. female MRN [id number]  Date of birth: 1945/12/16  Office Visit Note: Visit Date: 06/07/2016 PCP: Kandice Hams, MD Referred by: Seward Carol, MD  Subjective: Chief Complaint  Patient presents with  . Lower Back - Pain   HPI: [name] is a 71 year old female that we saw in December and completed diagnostic left L4 transforaminal epidural steroid injection and she states she did well with last injection. Says its the best relief she has had with any injections. Starting to have pain again but not as bad as it was. Radiating down left leg to knee. Denies numbness or tingling. 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    ROS Otherwise per HPI.  Assessment & Plan: Visit Diagnoses:  1. Lumbar radiculopathy     Plan: Findings:  Repeat L4 transforaminal injection for her stenosis and radiculopathy radiculitis.    Meds & Orders:  Meds ordered this encounter  Medications  . lidocaine (PF) (XYLOCAINE) 1 % injection 0.3 mL  . methylPREDNISolone acetate (DEPO-MEDROL) injection 80 mg  . azithromycin (ZITHROMAX) 250 MG tablet    Sig: Take 2 by mouth on day 1 then 1 by mouth daily for 5 days    Dispense:  6 each    Refill:  0    Orders Placed This Encounter  Procedures  . XR C-ARM NO REPORT  . Epidural Steroid injection    Follow-up: Return if symptoms worsen or fail to improve.   Procedures: No procedures performed  Lumbosacral Transforaminal Epidural Steroid Injection - Infraneural Approach with Fluoroscopic Guidance  Patient: [name]      Date of Birth: October 12, 1945 MRN: [id number] PCP: Kandice Hams, MD      Visit Date: 06/07/2016   Universal Protocol:    Date/Time: 02/13/185:39 AM  Consent Given By: the patient  Position: PRONE   Additional Comments: Vital signs were monitored before and after the procedure. Patient was prepped and draped in the usual sterile fashion. The correct patient, procedure, and site was  verified.   Injection Procedure Details:  Procedure Site One Meds Administered:  Meds ordered this encounter  Medications  . lidocaine (PF) (XYLOCAINE) 1 % injection 0.3 mL  . methylPREDNISolone acetate (DEPO-MEDROL) injection 80 mg  . azithromycin (ZITHROMAX) 250 MG tablet    Sig: Take 2 by mouth on day 1 then 1 by mouth daily for 5 days    Dispense:  6 each    Refill:  0      Laterality: Left  Location/Site:  L4-L5  Needle size: 22 G  Needle type: Spinal  Needle Placement: Transforaminal  Findings:  -Contrast Used: 1 mL iohexol 180 mg iodine/mL   -Comments: Excellent flow of contrast along the nerve and into the epidural space.  Procedure Details: After squaring off the end-plates of the desired vertebral level to get a true AP view, the C-arm was obliqued to the painful side so that the superior articulating process is positioned about 1/3 the length of the inferior endplate.  The needle was aimed toward the junction of the superior articular process and the transverse process of the inferior vertebrae. The needle's initial entry is in the lower third of the foramen through Kambin's triangle. The soft tissues overlying this target were infiltrated with 2-3 ml. of 1% Lidocaine without Epinephrine.  The spinal needle was then inserted and advanced toward the target using a "trajectory" view along the fluoroscope beam.  Under AP and lateral visualization, the needle was  advanced so it did not puncture dura and did not traverse medially beyond the 6 o'clock position of the pedicle. Bi-planar projections were used to confirm position. Aspiration was confirmed to be negative for CSF and/or blood. A 1-2 ml. volume of Isovue-250 was injected and flow of contrast was noted at each level. Radiographs were obtained for documentation purposes.   After attaining the desired flow of contrast documented above, a 0.5 to 1.0 ml test dose of 0.25% Marcaine was injected into each respective  transforaminal space.  The patient was observed for 90 seconds post injection.  After no sensory deficits were reported, and normal lower extremity motor function was noted,   the above injectate was administered so that equal amounts of the injectate were placed at each foramen (level) into the transforaminal epidural space.   Additional Comments:  The patient tolerated the procedure well No complications occurred Dressing: Band-Aid    Post-procedure details: Patient was observed during the procedure. Post-procedure instructions were reviewed.  Patient left the clinic in stable condition.   Clinical History: No specialty comments available.  She reports that she has quit smoking. Her smoking use included Cigarettes. She has a 3.75 pack-year smoking history. She has never used smokeless tobacco. No results for input(s): HGBA1C, LABURIC in the last 8760 hours.  Objective:  VS:  HT:    WT:   BMI:     BP:129/71  HR:67bpm  TEMP: ( )  RESP:99 % Physical Exam  Musculoskeletal:  She does have tenderness across the lower back and greater trochanters with good distal strength.    Ortho Exam Imaging: No results found.  Past Medical/Family/Surgical/Social History: Medications & Allergies reviewed per EMR Patient Active Problem List   Diagnosis Date Noted  . Genetic testing 03/12/2016  . Family history of breast cancer in female 02/08/2016  . Rash and nonspecific skin eruption 10/03/2015  . Malignant neoplasm of upper-outer quadrant of right breast in female, estrogen receptor positive ([place]) 09/01/2015  . 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 Menopausal symptoms   . Chronic vaginitis   . Fibromyalgia   . Interstitial cystitis   . UNSPECIFIED HYPOTHYROIDISM 06/14/2009  . PURE HYPERCHOLESTEROLEMIA 06/14/2009  . DEPRESSIVE DISORDER NOT ELSEWHERE CLASSIFIED 06/14/2009  . PERIPHERAL NEUROPATHY 06/14/2009  . ESSENTIAL HYPERTENSION, BENIGN 06/14/2009  . UNSPEC HEMORRHOIDS  WITHOUT MENTION COMPLICATION 62/83/6629  . ALLERGIC RHINITIS CAUSE UNSPECIFIED 06/14/2009  . ESOPHAGEAL REFLUX 06/14/2009  . ACUTE GASTRITIS WITHOUT MENTION OF HEMORRHAGE 06/14/2009  . IRRITABLE BOWEL SYNDROME 06/14/2009  . INSOMNIA UNSPECIFIED 06/14/2009  . INTERSTITIAL CYSTITIS 08/27/2007  . VULVOVAGINITIS, CHRONIC 08/27/2007  . Fibromyalgia syndrome 08/27/2007  . Osteoporosis 08/27/2007   Past Medical History:  Diagnosis Date  . Arthritis   . Asthma   . Cancer [facility])    breast cancer  . Chronic vaginitis   . Fibromyalgia   . GERD (gastroesophageal reflux disease)   . History of radiation therapy 01/18/16-03/05/16   right breast/nodal areas 45 Gy, right breast boost 16 Gy  . Hypoglycemia   . Hypoglycemia   . Hypothyroidism   . Interstitial cystitis   . Menopausal symptoms   . Osteoporosis   . Pneumonia   . PONV (postoperative nausea and vomiting)   . Thyroid disease    hypothyroid   Family History  Problem Relation Age of Onset  . Hypertension Mother   . Heart disease Mother     d. 62  . Parkinsonism Father     d. 43  . Heart disease  Sister   . Breast cancer Sister 26    s/p mastectomy; reportedly negative BRCA1/2 testing approx 7 years ago  . Melanoma Sister     d. 24s; hx of incresed sun exposure  . Cancer Sister     dx. sarcoma of her face in her 59s; treated w/ MOHS surgery  . Parkinsonism Paternal Aunt     d. 90s  . Parkinsonism Paternal Uncle     d. late 55s  . Kidney failure Maternal Grandmother 29  . Heart attack Maternal Grandfather     d. 79s  . Heart disease Paternal Grandmother     d. 2  . Breast cancer Other 67    maternal great aunt (MGF's sister)  . Kidney cancer Paternal Uncle 55    d. 1s; unsure about smoking status   Past Surgical History:  Procedure Laterality Date  . BLADDER HYPEREXTENSION     X4  . BREAST LUMPECTOMY WITH NEEDLE LOCALIZATION AND AXILLARY LYMPH NODE DISSECTION Right 12/05/2015   Procedure: RIGHT BREAST NEEDLE  LOCALIZED (WIRES X 2) LUMPECTOMY WITH AXILLARY LYMPH NODE DISSECTION;  Surgeon: Fanny Skates, MD;  Location: [place];  Service: General;  Laterality: Right;  . BREAST LUMPECTOMY WITH RADIOACTIVE SEED LOCALIZATION Left 12/05/2015   Procedure: RADIOACTIVE SEED GUIDED LEFT BREAST LUMPECTOMY;  Surgeon: Fanny Skates, MD;  Location: [place];  Service: General;  Laterality: Left;  . BUNIONECTOMY    . [place]     DR. INGRAM  . CHOLECYSTECTOMY  01/2006  . COLONOSCOPY    . DENTAL SURGERY     13 dental implants  . LEG SURGERY  06/2004   Broken tibia  . MOUTH SURGERY    . NASAL SINUS SURGERY     X4  . PORT-A-CATH REMOVAL Right 12/05/2015   Procedure: REMOVAL PORT-A-CATH;  Surgeon: Fanny Skates, MD;  Location: [place];  Service: General;  Laterality: Right;  . PORTACATH PLACEMENT Left 09/08/2015   Procedure: INSERTION OF PORT-A-CATH ;  Surgeon: Fanny Skates, MD;  Location: [place];  Service: General;  Laterality: Left;   Social History   Occupational History  . Not on file.   Social History Main Topics  . Smoking status: Former Smoker    Packs/day: 0.25    Years: 15.00    Types: Cigarettes  . Smokeless tobacco: Never Used     Comment: social smoker/"on and off"  . Alcohol use 0.0 oz/week     Comment: social  . Drug use: No  . Sexual activity: Yes    Birth control/ protection: Post-menopausal

## 2016-06-12 NOTE — Procedures (Signed)
Lumbosacral Transforaminal Epidural Steroid Injection - Infraneural Approach with Fluoroscopic Guidance  Patient: [name]      Date of Birth: [birth year] MRN: [id number] PCP: Kandice Hams, MD      Visit Date: 06/07/2016   Universal Protocol:    Date/Time: 02/13/185:39 AM  Consent Given By: the patient  Position: PRONE   Additional Comments: Vital signs were monitored before and after the procedure. Patient was prepped and draped in the usual sterile fashion. The correct patient, procedure, and site was verified.   Injection Procedure Details:  Procedure Site One Meds Administered:  Meds ordered this encounter  Medications  . lidocaine (PF) (XYLOCAINE) 1 % injection 0.3 mL  . methylPREDNISolone acetate (DEPO-MEDROL) injection 80 mg  . azithromycin (ZITHROMAX) 250 MG tablet    Sig: Take 2 by mouth on day 1 then 1 by mouth daily for 5 days    Dispense:  6 each    Refill:  0      Laterality: Left  Location/Site:  L4-L5  Needle size: 22 G  Needle type: Spinal  Needle Placement: Transforaminal  Findings:  -Contrast Used: 1 mL iohexol 180 mg iodine/mL   -Comments: Excellent flow of contrast along the nerve and into the epidural space.  Procedure Details: After squaring off the end-plates of the desired vertebral level to get a true AP view, the C-arm was obliqued to the painful side so that the superior articulating process is positioned about 1/3 the length of the inferior endplate.  The needle was aimed toward the junction of the superior articular process and the transverse process of the inferior vertebrae. The needle's initial entry is in the lower third of the foramen through Kambin's triangle. The soft tissues overlying this target were infiltrated with 2-3 ml. of 1% Lidocaine without Epinephrine.  The spinal needle was then inserted and advanced toward the target using a "trajectory" view along the fluoroscope beam.  Under AP and lateral visualization, the  needle was advanced so it did not puncture dura and did not traverse medially beyond the 6 o'clock position of the pedicle. Bi-planar projections were used to confirm position. Aspiration was confirmed to be negative for CSF and/or blood. A 1-2 ml. volume of Isovue-250 was injected and flow of contrast was noted at each level. Radiographs were obtained for documentation purposes.   After attaining the desired flow of contrast documented above, a 0.5 to 1.0 ml test dose of 0.25% Marcaine was injected into each respective transforaminal space.  The patient was observed for 90 seconds post injection.  After no sensory deficits were reported, and normal lower extremity motor function was noted,   the above injectate was administered so that equal amounts of the injectate were placed at each foramen (level) into the transforaminal epidural space.   Additional Comments:  The patient tolerated the procedure well No complications occurred Dressing: Band-Aid    Post-procedure details: Patient was observed during the procedure. Post-procedure instructions were reviewed.  Patient left the clinic in stable condition.

## 2016-06-21 ENCOUNTER — Telehealth: Payer: Self-pay | Admitting: Oncology

## 2016-06-21 NOTE — Telephone Encounter (Signed)
Left message on patients VM about the appointment changes and our number [phone]

## 2016-07-04 ENCOUNTER — Other Ambulatory Visit: Payer: Medicare Other

## 2016-07-04 ENCOUNTER — Ambulatory Visit: Payer: Medicare Other | Admitting: Oncology

## 2016-07-05 ENCOUNTER — Encounter: Payer: Self-pay | Admitting: Radiation Oncology

## 2016-07-05 ENCOUNTER — Other Ambulatory Visit (HOSPITAL_BASED_OUTPATIENT_CLINIC_OR_DEPARTMENT_OTHER): Payer: Medicare Other

## 2016-07-05 ENCOUNTER — Ambulatory Visit
Admission: RE | Admit: 2016-07-05 | Discharge: 2016-07-05 | Disposition: A | Discharge status: Home / Self Care | Payer: Medicare Other | Source: Ambulatory Visit | Attending: Radiation Oncology | Admitting: Radiation Oncology

## 2016-07-05 DIAGNOSIS — Z880 Allergy status to penicillin: Secondary | ICD-10-CM | POA: Insufficient documentation

## 2016-07-05 DIAGNOSIS — Z006 Encounter for examination for normal comparison and control in clinical research program: Secondary | ICD-10-CM | POA: Diagnosis not present

## 2016-07-05 DIAGNOSIS — M797 Fibromyalgia: Secondary | ICD-10-CM | POA: Insufficient documentation

## 2016-07-05 DIAGNOSIS — Z170 Estrogen receptor positive status [ER+]: Principal | ICD-10-CM

## 2016-07-05 DIAGNOSIS — Z79899 Other long term (current) drug therapy: Secondary | ICD-10-CM | POA: Diagnosis not present

## 2016-07-05 DIAGNOSIS — C50411 Malignant neoplasm of upper-outer quadrant of right female breast: Secondary | ICD-10-CM | POA: Insufficient documentation

## 2016-07-05 DIAGNOSIS — Y842 Radiological procedure and radiotherapy as the cause of abnormal reaction of the patient, or of later complication, without mention of misadventure at the time of the procedure: Secondary | ICD-10-CM | POA: Diagnosis not present

## 2016-07-05 LAB — COMPREHENSIVE METABOLIC PANEL
ALT: 15 U/L (ref 0–55)
AST: 18 U/L (ref 5–34)
Albumin: 4 g/dL (ref 3.5–5.0)
Alkaline Phosphatase: 105 U/L (ref 40–150)
Anion Gap: 10 mEq/L (ref 3–11)
BUN: 16.5 mg/dL (ref 7.0–26.0)
CHLORIDE: 99 meq/L (ref 98–109)
CO2: 30 meq/L — AB (ref 22–29)
CREATININE: 0.9 mg/dL (ref 0.6–1.1)
Calcium: 9.9 mg/dL (ref 8.4–10.4)
EGFR: 62 mL/min/{1.73_m2} — ABNORMAL LOW (ref >90)
Glucose: 93 mg/dl (ref 70–140)
POTASSIUM: 4.4 meq/L (ref 3.5–5.1)
Sodium: 139 mEq/L (ref 136–145)
Total Bilirubin: 0.69 mg/dL (ref 0.20–1.20)
Total Protein: 7.3 g/dL (ref 6.4–8.3)

## 2016-07-05 LAB — HEMOGLOBIN A1C
ESTIMATED AVERAGE GLUCOSE: 103 mg/dL
HEMOGLOBIN A1C: 5.2 % (ref 4.8–5.6)

## 2016-07-05 LAB — CBC WITH DIFFERENTIAL/PLATELET
BASO%: 0.6 % (ref 0.0–2.0)
Basophils Absolute: 0 10*3/uL (ref 0.0–0.1)
EOS%: 2.1 % (ref 0.0–7.0)
Eosinophils Absolute: 0.2 10*3/uL (ref 0.0–0.5)
HCT: 41.9 % (ref 34.8–46.6)
HGB: 14.2 g/dL (ref 11.6–15.9)
LYMPH%: 16.6 % (ref 14.0–49.7)
MCH: 34.8 pg — AB (ref 25.1–34.0)
MCHC: 34 g/dL (ref 31.5–36.0)
MCV: 102.4 fL — ABNORMAL HIGH (ref 79.5–101.0)
MONO#: 0.6 10*3/uL (ref 0.1–0.9)
MONO%: 8.3 % (ref 0.0–14.0)
NEUT#: 5.3 10*3/uL (ref 1.5–6.5)
NEUT%: 72.4 % (ref 38.4–76.8)
Platelets: 285 10*3/uL (ref 145–400)
RBC: 4.09 10*6/uL (ref 3.70–5.45)
RDW: 14.9 % — ABNORMAL HIGH (ref 11.2–14.5)
WBC: 7.3 10*3/uL (ref 3.9–10.3)
lymph#: 1.2 10*3/uL (ref 0.9–3.3)

## 2016-07-05 LAB — RESEARCH LABS

## 2016-07-05 NOTE — Progress Notes (Signed)
Anne Fu here today for follow up for treatment to right breast.  Reports having pain rated 6 out of 10 that is generalized related to fibromyalgia and uses Norco for pain.  Reports having a good deal of energy.  Patient states that her appetite occasionally is off but denies any nausea or vomiting.  Skin to right breast intact and has small patch of redness to left of right areolar.    Vitals:   07/05/16 1051  BP: 140/81  Pulse: 64  Resp: 20  Temp: 98.2 F (36.8 C)  TempSrc: Oral  SpO2: 95%  Weight: 156 lb 12.8 oz (71.1 kg)    Wt Readings from Last 3 Encounters:  07/05/16 156 lb 12.8 oz (71.1 kg)  05/21/16 155 lb 6.4 oz (70.5 kg)  04/27/16 153 lb 3.2 oz (69.5 kg)

## 2016-07-05 NOTE — Progress Notes (Addendum)
Radiation Oncology         [phone] ________________________________  Name: [name] MRN: [id number]  Date: 07/05/2016  DOB: July 21, 1945  Follow-Up Visit Note  CC: Kandice Hams, MD  Magrinat, Virgie Dad, MD    ICD-9-CM ICD-10-CM   1. 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 Patient reports 6/10 general pain related to fibromyalgia. She reports using Norco for pain. She notes a good energy level. Patient reports occasional decreased appetite but denies nausea or vomiting. Per nursing, skin to the right breast is intact with a large patch of erythema to the left of the right areolar.                  ALLERGIES:  is allergic to nitrofurantoin; penicillins; chlorhexidine; and other.  Meds: Current Outpatient Prescriptions  Medication Sig Dispense Refill  . albuterol (PROVENTIL HFA;VENTOLIN HFA) 108 (90 Base) MCG/ACT inhaler Inhale 1-2 puffs into the lungs every 6 (six) hours as needed for wheezing or shortness of breath.    . anastrozole (ARIMIDEX) 1 MG tablet Take 1 tablet (1 mg total) by mouth daily. 90 tablet 4  . buPROPion (WELLBUTRIN XL) 300 MG 24 hr tablet Take 300 mg by mouth daily.      . Calcium Carbonate-Vitamin D (CALCIUM + D PO) Take 1 tablet by mouth at bedtime.     . carisoprodol (SOMA) 350 MG tablet Take 350 mg by mouth 2 (two) times daily as needed for muscle spasms.   2  . HYDROcodone-acetaminophen (NORCO) 10-325 MG tablet Take 1 tablet by mouth every 4 (four) hours as needed for moderate pain or severe pain.    . hydrOXYzine (ATARAX/VISTARIL) 25 MG tablet Take 1 tablet (25 mg total) by mouth 3  (three) times daily as needed for anxiety or itching. 30 tablet 0  . levothyroxine (SYNTHROID) 137 MCG tablet Take 137 mcg by mouth daily.     Marland Kitchen LORazepam (ATIVAN) 0.5 MG tablet Take 1 tablet (0.5 mg total) by mouth at bedtime as needed (Nausea or vomiting). 30 tablet 0  . Multiple Vitamin (MULTIVITAMIN) capsule Take 1 capsule by mouth daily.      . pantoprazole (PROTONIX) 40 MG tablet Take 40 mg by mouth daily.    Vladimir Faster Glycol-Propyl Glycol (SYSTANE OP) Place 1 drop into both eyes as needed (for dry eyes).    . prochlorperazine (COMPAZINE) 10 MG tablet Take 1 tablet (10 mg total) by mouth every 6 (six) hours as needed (Nausea or vomiting). 30 tablet 1  . triamterene-hydrochlorothiazide (DYAZIDE) 37.5-25 MG capsule Take 1 tablet by mouth daily.     . Vitamin D, Ergocalciferol, (DRISDOL) 50000 UNITS CAPS capsule One tablet every other week (Patient taking differently: Take 50,000 Units by mouth every 14 (fourteen) days. One tablet every other week) 10 capsule 3  . zolpidem (AMBIEN) 10 MG tablet Take 10 mg by mouth at bedtime as needed for sleep.     Marland Kitchen azithromycin (ZITHROMAX) 250 MG tablet Take 2 by mouth on day 1 then 1 by mouth daily for 5 days (Patient not taking: Reported on 07/05/2016) 6 each  0  . calcitonin, salmon, (MIACALCIN/FORTICAL) 200 UNIT/ACT nasal spray Place 1 spray into alternate nostrils daily. (Patient not taking: Reported on 07/05/2016) 3.7 mL 11  . cycloSPORINE (RESTASIS) 0.05 % ophthalmic emulsion Place 1 drop into both eyes 2 (two) times daily. (Patient not taking: Reported on 07/05/2016) 5.5 mL 3  . diazepam (VALIUM) 5 MG tablet Take 5 mg by mouth every 6 (six) hours as needed for muscle spasms.     . diazepam (VALIUM) 5 MG tablet Take 1 or 2 tablets before MRI study (Patient not taking: Reported on 07/05/2016) 4 tablet 0  . fluconazole (DIFLUCAN) 100 MG tablet Take 1 tablet (100 mg total) by mouth daily. Take 2 tablets on day 1 (Patient not taking: Reported on 07/05/2016) 8 tablet 0   . fluorometholone (FML) 0.1 % ophthalmic suspension PLACE 1 GTT AEY QD  3  . nabumetone (RELAFEN) 750 MG tablet Take 750 mg by mouth 2 (two) times daily as needed for mild pain.    Marland Kitchen oxyCODONE (OXY IR/ROXICODONE) 5 MG immediate release tablet Take 1-2 tablets (5-10 mg total) by mouth every 6 (six) hours as needed for severe pain. (Patient not taking: Reported on 04/05/2016) 40 tablet 0  . silver sulfADIAZINE (SILVADENE) 1 % cream Apply 1 application topically 2 (two) times daily.    . valACYclovir (VALTREX) 500 MG tablet Take 1 tablet (500 mg total) by mouth 2 (two) times daily. (Patient not taking: Reported on 07/05/2016) 60 tablet 1   Current Facility-Administered Medications  Medication Dose Route Frequency Provider Last Rate Last Dose  . lidocaine (PF) (XYLOCAINE) 1 % injection 0.3 mL  0.3 mL Other Once Magnus Sinning, MD      . lidocaine (PF) (XYLOCAINE) 1 % injection 0.3 mL  0.3 mL Other Once Magnus Sinning, MD      . methylPREDNISolone acetate (DEPO-MEDROL) injection 80 mg  80 mg Other Once Magnus Sinning, MD        Physical Findings: The patient is in no acute distress. Patient is alert and oriented.  weight is 156 lb 12.8 oz (71.1 kg). Her oral temperature is 98.2 F (36.8 C). Her blood pressure is 140/81 and her pulse is 64. Her respiration is 20 and oxygen saturation is 95%. .  No significant changes. Lungs are clear to auscultation bilaterally. Heart has regular rate and rhythm. No palpable cervical, supraclavicular, or axillary adenopathy. Abdomen soft, non-tender, normal bowel sounds.  Left breast: No palpable mass or nipple discharge. Right breast: patient continues to have some edema in the breast, she has hyperpigmentation changes. No dominant mass appreciated in the breast, nipple discharge, or bleeding. Erythema in the lower inner aspect of the breast which the patient had not appreciated before. This does not appear to be infection. Lab Findings: Lab Results  Component  Value Date   WBC 6.6 05/21/2016   HGB 13.2 05/21/2016   HCT 38.0 05/21/2016   MCV 103.3 (H) 05/21/2016   PLT 248 05/21/2016    Radiographic Findings: Xr C-arm No Report  Result Date: 06/07/2016 Please see Notes or Procedures tab for imaging impression.   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 The patient is recovering from the effects of radiation.  No evidence of recurrence on clinical exam today. She is clinically doing well. I am unsure why she has erythema in the breast but the patient will see Dr. Jana Hakim in the next several days for evaluation. She will  be due for breast imaging in April.  Plan:  Follow up with radiation oncology in 3 months.  ____________________________________    This document serves as a record of services personally performed by Gery Pray, MD. It was created on his behalf by Bethann Humble, a trained medical scribe. The creation of this record is based on the scribe's personal observations and the provider's statements to them. This document has been checked and approved by the attending provider.

## 2016-07-09 ENCOUNTER — Ambulatory Visit: Payer: Medicare Other | Admitting: Oncology

## 2016-07-09 ENCOUNTER — Other Ambulatory Visit: Payer: Medicare Other

## 2016-07-13 ENCOUNTER — Encounter: Payer: Medicare Other | Admitting: *Deleted

## 2016-07-13 ENCOUNTER — Ambulatory Visit (HOSPITAL_BASED_OUTPATIENT_CLINIC_OR_DEPARTMENT_OTHER): Payer: Medicare Other | Admitting: Oncology

## 2016-07-13 ENCOUNTER — Telehealth: Payer: Self-pay | Admitting: Oncology

## 2016-07-13 ENCOUNTER — Other Ambulatory Visit: Payer: Medicare Other

## 2016-07-13 ENCOUNTER — Encounter: Payer: Self-pay | Admitting: *Deleted

## 2016-07-13 VITALS — BP 137/80 | HR 72 | Temp 97.7°F | Resp 17 | Ht 62.0 in | Wt 154.4 lb

## 2016-07-13 DIAGNOSIS — Z170 Estrogen receptor positive status [ER+]: Secondary | ICD-10-CM | POA: Diagnosis not present

## 2016-07-13 DIAGNOSIS — B372 Candidiasis of skin and nail: Secondary | ICD-10-CM

## 2016-07-13 DIAGNOSIS — Z79811 Long term (current) use of aromatase inhibitors: Secondary | ICD-10-CM | POA: Diagnosis not present

## 2016-07-13 DIAGNOSIS — C50411 Malignant neoplasm of upper-outer quadrant of right female breast: Secondary | ICD-10-CM

## 2016-07-13 MED ORDER — PROCHLORPERAZINE MALEATE 10 MG PO TABS: 10.0000 mg | ORAL_TABLET | Freq: Four times a day (QID) | ORAL | 1 refills | Status: DC | PRN

## 2016-07-13 MED ORDER — CYCLOSPORINE 0.05 % OP EMUL: 1.0000 [drp] | Freq: Two times a day (BID) | OPHTHALMIC | 3 refills | Status: DC

## 2016-07-13 MED ORDER — FLUCONAZOLE 100 MG PO TABS: 100.0000 mg | ORAL_TABLET | Freq: Every day | ORAL | 0 refills | Status: DC

## 2016-07-13 NOTE — Progress Notes (Signed)
[place]  [phone] [phone]     ID: LILLEY HUBBLE DOB: 1945-10-29  MR#: [id number]  PPJ#:[id number]  Patient Care Team: Seward Carol, MD as PCP - General (Internal Medicine) Chauncey Cruel, MD as Consulting Physician (Oncology) Fanny Skates, MD as Consulting Physician (General Surgery) Luberta Robertson, MD (Radiology) Hennie Duos, MD as Consulting Physician (Rheumatology) Domingo Pulse, MD (Urology) Druscilla Brownie, MD as Consulting Physician (Dermatology) Luberta Mutter, MD as Consulting Physician (Ophthalmology) Magnus Sinning, MD as Consulting Physician (Physical Medicine and Rehabilitation) Benson Norway, RN as Registered Nurse (Oncology) OTHER MD:  CHIEF COMPLAINT: Locally advanced breast cancer  CURRENT TREATMENT:  Anastrozole; awaiting PALLAS randomization  INTERVAL HISTORY: Vernis returns today for follow-up of her estrogen receptor positive breast cancer. Her research nurse in the [place] also participated in today's visit.  Jilliana recently returned from a long trip to [place] she greatly enjoyed. She continues on anastrozole, with good tolerance. She does have some hot flashes and they do sometimes wake her up at night. This is not something that she needs requires further intervention. Vaginal dryness is not an issue. Currently she is obtaining the drug at no cost.  She has signed consent for the PALLAS trial and should be randomized sometime next week  REVIEW OF SYSTEMS: Tamyra hair has not yet completely recovered and she finds the weeks' itchy. The right breast skin is somewhat indurated from the radiation. She understands this likely will be a permanent change. It is not alarming to her. She has slight discomfort in her right elbow she wanted me to check. She describes herself is moderately fatigued, but "this is the baseline close boats. Similar with her sciatica. It is unchanged from baseline. She exercises mostly by  walking. She has a fit bed and tells me she does at least 2000 steps daily. A detailed review of systems today was otherwise noncontributory.   BREAST CANCER HISTORY: From the original intake note:  Shiquita had routine mammographic screening in 08/18/2015 at the [facility], showing a right breast mass and possible axillary adenopathy. On 08/23/2015 she underwent bilateral diagnostic mammography with tomography and bilateral breast ultrasonography. The breast density was category B in the right breast there was a spiculated mass in the upper-outer quadrant measuring 1.2 cm and associated with pleomorphic calcifications. There were calcifications extending anteriorly and posteriorly from the mass worrisome for ductal carcinoma in situ. The total area in question is about 3 cm. There were also multiple enlarged right axillary lymph nodes. The mass was palpated as an area of fullness at the 12:00 position of the breast 3 cm from the nipple. There were palpable enlarged right axillary lymph nodes in the right axilla. Ultrasonography of the right breast confirmed an irregular hypoechoic mass in the area in question measuring 1.6 cm. There were multiple abnormal appearing enlarged right axillary lymph nodes, the largest measuring 2.3 cm.  In the left breast, mammography showed an area of architectural distortion associated with the left nipple. This was not palpable. Ultrasonography of the left breast found no correlate. This area of the left breast was biopsied under tomography 08/24/2015. This showed (SAA [id number]) a complex sclerosing lesion and fibroadenoma.  On 08/23/2015 the patient underwent biopsy of the right breast mass in 1 of the abnormal right axillary lymph nodes. Both were positive for invasive ductal carcinoma, grade 2 both estrogen receptor 100% positive and both progesterone receptor 5% positive with MIB-1 of 20-30% (the higher 1 obtained from the  lymph node). Both biopsies were HER-2  nonamplified, the breast being ratio 1.27 and number per cell 1.90, and the lymph node 1 ratio 1.24 and number per cell 3.0.  On 08/26/2015 the patient underwent bilateral breast MRI. In the left breast there was no suspicious enhancement. The left axilla was benign. In the right breast there was a mass described as in the upper inner quadrant measuring 2.3 cm. Less than a centimeter away from this mass there was a satellite nodule measuring 0.9 cm. In the right axilla there were multiple enlarged right axillary lymph nodes, including a confluent region measuring up to 4 cm. There were also enlarged level II lymph nodes present measuring up to 2.1 cm.  The patient's subsequent history is as detailed below  PAST MEDICAL HISTORY: Past Medical History:  Diagnosis Date  . Arthritis   . Asthma   . Cancer [facility])    breast cancer  . Chronic vaginitis   . Fibromyalgia   . GERD (gastroesophageal reflux disease)   . History of radiation therapy 01/18/16-03/05/16   right breast/nodal areas 45 Gy, right breast boost 16 Gy  . Hypoglycemia   . Hypoglycemia   . Hypothyroidism   . Interstitial cystitis   . Menopausal symptoms   . Osteoporosis   . Pneumonia   . PONV (postoperative nausea and vomiting)   . Thyroid disease    hypothyroid    PAST SURGICAL HISTORY: Past Surgical History:  Procedure Laterality Date  . BLADDER HYPEREXTENSION     X4  . BREAST LUMPECTOMY WITH NEEDLE LOCALIZATION AND AXILLARY LYMPH NODE DISSECTION Right 12/05/2015   Procedure: RIGHT BREAST NEEDLE LOCALIZED (WIRES X 2) LUMPECTOMY WITH AXILLARY LYMPH NODE DISSECTION;  Surgeon: Fanny Skates, MD;  Location: [place];  Service: General;  Laterality: Right;  . BREAST LUMPECTOMY WITH RADIOACTIVE SEED LOCALIZATION Left 12/05/2015   Procedure: RADIOACTIVE SEED GUIDED LEFT BREAST LUMPECTOMY;  Surgeon: Fanny Skates, MD;  Location: [place];  Service: General;  Laterality: Left;  . BUNIONECTOMY    . [place]     DR. INGRAM  .  CHOLECYSTECTOMY  01/2006  . COLONOSCOPY    . DENTAL SURGERY     13 dental implants  . LEG SURGERY  06/2004   Broken tibia  . MOUTH SURGERY    . NASAL SINUS SURGERY     X4  . PORT-A-CATH REMOVAL Right 12/05/2015   Procedure: REMOVAL PORT-A-CATH;  Surgeon: Fanny Skates, MD;  Location: [place];  Service: General;  Laterality: Right;  . PORTACATH PLACEMENT Left 09/08/2015   Procedure: INSERTION OF PORT-A-CATH ;  Surgeon: Fanny Skates, MD;  Location: [facility] OR;  Service: General;  Laterality: Left;    FAMILY HISTORY Family History  Problem Relation Age of Onset  . Hypertension Mother   . Heart disease Mother     d. 25  . Parkinsonism Father     d. 50  . Heart disease Sister   . Breast cancer Sister 53    s/p mastectomy; reportedly negative BRCA1/2 testing approx 7 years ago  . Melanoma Sister     d. 57s; hx of incresed sun exposure  . Cancer Sister     dx. sarcoma of her face in her 3s; treated w/ MOHS surgery  . Parkinsonism Paternal Aunt     d. 90s  . Parkinsonism Paternal Uncle     d. late 67s  . Kidney failure Maternal Grandmother 38  . Heart attack Maternal Grandfather     d. 3s  .  Heart disease Paternal Grandmother     d. 80  . Breast cancer Other 17    maternal great aunt (MGF's sister)  . Kidney cancer Paternal Uncle 20    d. 55s; unsure about smoking status  The patient's father died at age 22 from complications of Parkinson's disease. The patient's mother died age 85 with heart disease. Shanyn had no brothers. She had 2 sisters. One sister was diagnosed with breast cancer at age 98. She was tested for the BRCA gene and was negative. The same sister also had melanoma diagnosed in her 96s. The patient has one maternal great aunt diagnosed with breast cancer around age 28. There is no history of ovarian cancer in the family  GYNECOLOGIC HISTORY:  No LMP recorded. Patient is postmenopausal. Menarche age 4, first live birth age 71. The patient is GX P2. She stopped having  periods in her early 53s and took hormone replacement until her breast cancer diagnosis, April 2017.  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 He has been in remission for more than 10 years. Their daughter Nira Conn lives in [place] were she is Production designer, theatre/television/film of the [employer]. Daughter Belenda Cruise is an infectious disease M.D. working and teaching at [employer] in [place]. The patient has one granddaughter, 26 months old as of May 2017. The family attends [employer]: Not in place   HEALTH MAINTENANCE: Social History  Substance Use Topics  . Smoking status: Former Smoker    Packs/day: 0.25    Years: 15.00    Types: Cigarettes  . Smokeless tobacco: Never Used     Comment: social smoker/"on and off"  . Alcohol use 0.0 oz/week     Comment: social     Colonoscopy: 2016/ [facility]  PAP: 2015  Bone density:   Lipid panel:  Allergies  Allergen Reactions  . Nitrofurantoin Other (See Comments)    Unknown reaction  . Penicillins Other (See Comments)    Has patient had a PCN reaction causing immediate rash, facial/tongue/throat swelling, SOB or lightheadedness with hypotension: No Has patient had a PCN reaction causing severe rash involving mucus membranes or skin necrosis: No Has patient had a PCN reaction that required hospitalization No Has patient had a PCN reaction occurring within the last 10 years: No If all of the above answers are "NO", then may proceed with Cephalosporin use.  . Chlorhexidine Other (See Comments)    [place] IS OK. The CLOTH WIPE causes reaction. Causes itching all over the area  . Other Rash    Adhesive tape    Current Outpatient Prescriptions  Medication Sig Dispense Refill  . albuterol (PROVENTIL HFA;VENTOLIN HFA) 108 (90 Base)  MCG/ACT inhaler Inhale 1-2 puffs into the lungs every 6 (six) hours as needed for wheezing or shortness of breath.    . anastrozole (ARIMIDEX) 1 MG tablet Take 1 tablet (1 mg total) by mouth daily. 90 tablet 4  . azithromycin (ZITHROMAX) 250 MG tablet Take 2 by mouth on day 1 then 1 by mouth daily for 5 days (Patient not taking: Reported on 07/05/2016) 6 each 0  . buPROPion (WELLBUTRIN XL) 300 MG 24 hr tablet Take 300 mg by mouth daily.      . calcitonin, salmon, (MIACALCIN/FORTICAL) 200 UNIT/ACT nasal spray Place 1 spray  into alternate nostrils daily. (Patient not taking: Reported on 07/05/2016) 3.7 mL 11  . Calcium Carbonate-Vitamin D (CALCIUM + D PO) Take 1 tablet by mouth at bedtime.     . carisoprodol (SOMA) 350 MG tablet Take 350 mg by mouth 2 (two) times daily as needed for muscle spasms.   2  . cycloSPORINE (RESTASIS) 0.05 % ophthalmic emulsion Place 1 drop into both eyes 2 (two) times daily. (Patient not taking: Reported on 07/05/2016) 5.5 mL 3  . diazepam (VALIUM) 5 MG tablet Take 5 mg by mouth every 6 (six) hours as needed for muscle spasms.     . diazepam (VALIUM) 5 MG tablet Take 1 or 2 tablets before MRI study (Patient not taking: Reported on 07/05/2016) 4 tablet 0  . fluconazole (DIFLUCAN) 100 MG tablet Take 1 tablet (100 mg total) by mouth daily. Take 2 tablets on day 1 (Patient not taking: Reported on 07/05/2016) 8 tablet 0  . fluorometholone (FML) 0.1 % ophthalmic suspension PLACE 1 GTT AEY QD  3  . HYDROcodone-acetaminophen (NORCO) 10-325 MG tablet Take 1 tablet by mouth every 4 (four) hours as needed for moderate pain or severe pain.    . hydrOXYzine (ATARAX/VISTARIL) 25 MG tablet Take 1 tablet (25 mg total) by mouth 3 (three) times daily as needed for anxiety or itching. 30 tablet 0  . levothyroxine (SYNTHROID) 137 MCG tablet Take 137 mcg by mouth daily.     Marland Kitchen LORazepam (ATIVAN) 0.5 MG tablet Take 1 tablet (0.5 mg total) by mouth at bedtime as needed (Nausea or vomiting). 30 tablet 0  .  Multiple Vitamin (MULTIVITAMIN) capsule Take 1 capsule by mouth daily.      . nabumetone (RELAFEN) 750 MG tablet Take 750 mg by mouth 2 (two) times daily as needed for mild pain.    Marland Kitchen oxyCODONE (OXY IR/ROXICODONE) 5 MG immediate release tablet Take 1-2 tablets (5-10 mg total) by mouth every 6 (six) hours as needed for severe pain. (Patient not taking: Reported on 04/05/2016) 40 tablet 0  . pantoprazole (PROTONIX) 40 MG tablet Take 40 mg by mouth daily.    Vladimir Faster Glycol-Propyl Glycol (SYSTANE OP) Place 1 drop into both eyes as needed (for dry eyes).    . prochlorperazine (COMPAZINE) 10 MG tablet Take 1 tablet (10 mg total) by mouth every 6 (six) hours as needed (Nausea or vomiting). 30 tablet 1  . silver sulfADIAZINE (SILVADENE) 1 % cream Apply 1 application topically 2 (two) times daily.    Marland Kitchen triamterene-hydrochlorothiazide (DYAZIDE) 37.5-25 MG capsule Take 1 tablet by mouth daily.     . valACYclovir (VALTREX) 500 MG tablet Take 1 tablet (500 mg total) by mouth 2 (two) times daily. (Patient not taking: Reported on 07/05/2016) 60 tablet 1  . Vitamin D, Ergocalciferol, (DRISDOL) 50000 UNITS CAPS capsule One tablet every other week (Patient taking differently: Take 50,000 Units by mouth every 14 (fourteen) days. One tablet every other week) 10 capsule 3  . zolpidem (AMBIEN) 10 MG tablet Take 10 mg by mouth at bedtime as needed for sleep.      Current Facility-Administered Medications  Medication Dose Route Frequency Provider Last Rate Last Dose  . lidocaine (PF) (XYLOCAINE) 1 % injection 0.3 mL  0.3 mL Other Once Magnus Sinning, MD      . lidocaine (PF) (XYLOCAINE) 1 % injection 0.3 mL  0.3 mL Other Once Magnus Sinning, MD      . methylPREDNISolone acetate (DEPO-MEDROL) injection 80 mg  80 mg Other Once  Magnus Sinning, MD        OBJECTIVE: Middle-aged white woman Who appears well  Vitals:   07/13/16 1152  BP: 137/80  Pulse: 72  Resp: 17  Temp: 97.7 F (36.5 C)     Body mass index is  28.24 kg/m.    ECOG FS:1 - Symptomatic but completely ambulatory  Hair is still very thin and there is an area near the crown that is mostly glabrous Sclerae unicteric, pupils round and equal Oropharynx clear and moist-- no thrush or other lesions No cervical or supraclavicular adenopathy Lungs no rales or rhonchi Heart regular rate and rhythm Abd soft, nontender, positive bowel sounds MSK no focal spinal tenderness, no upper extremity lymphedema Neuro: nonfocal, well oriented, appropriate affect Breasts: The right breast has undergone lumpectomy and radiation. There is some skin thickening as expected from the radiation. There is no evidence of local recurrence in the breast or chest wall including the supra and infraclavicular areas.. The left breast is unremarkable. Both axillae are benign Skin: Erythema in the inframammary fold right greater than left  LAB RESULTS:  CMP     Component Value Date/Time   NA 139 07/05/2016 1211   K 4.4 07/05/2016 1211   CL 101 12/15/2015 2044   CO2 30 (H) 07/05/2016 1211   GLUCOSE 93 07/05/2016 1211   BUN 16.5 07/05/2016 1211   CREATININE 0.9 07/05/2016 1211   CALCIUM 9.9 07/05/2016 1211   PROT 7.3 07/05/2016 1211   ALBUMIN 4.0 07/05/2016 1211   AST 18 07/05/2016 1211   ALT 15 07/05/2016 1211   ALKPHOS 105 07/05/2016 1211   BILITOT 0.69 07/05/2016 1211   GFRNONAA >60 12/15/2015 2044   GFRAA >60 12/15/2015 2044    INo results found for: SPEP, UPEP  Lab Results  Component Value Date   WBC 7.3 07/05/2016   NEUTROABS 5.3 07/05/2016   HGB 14.2 07/05/2016   HCT 41.9 07/05/2016   MCV 102.4 (H) 07/05/2016   PLT 285 07/05/2016      Chemistry      Component Value Date/Time   NA 139 07/05/2016 1211   K 4.4 07/05/2016 1211   CL 101 12/15/2015 2044   CO2 30 (H) 07/05/2016 1211   BUN 16.5 07/05/2016 1211   CREATININE 0.9 07/05/2016 1211      Component Value Date/Time   CALCIUM 9.9 07/05/2016 1211   ALKPHOS 105 07/05/2016 1211   AST 18  07/05/2016 1211   ALT 15 07/05/2016 1211   BILITOT 0.69 07/05/2016 1211       No results found for: LABCA2  No components found for: LABCA125  No results for input(s): INR in the last 168 hours.  Urinalysis    Component Value Date/Time   COLORURINE YELLOW 12/15/2015 2208   APPEARANCEUR CLEAR 12/15/2015 2208   LABSPEC 1.014 12/15/2015 2208   PHURINE 6.5 12/15/2015 2208   GLUCOSEU NEGATIVE 12/15/2015 2208   HGBUR NEGATIVE 12/15/2015 2208   BILIRUBINUR NEGATIVE 12/15/2015 2208   KETONESUR NEGATIVE 12/15/2015 2208   PROTEINUR NEGATIVE 12/15/2015 2208   NITRITE NEGATIVE 12/15/2015 2208   LEUKOCYTESUR NEGATIVE 12/15/2015 2208      ELIGIBLE FOR AVAILABLE RESEARCH PROTOCOL: PALLAS  STUDIES: No results found.  ASSESSMENT: 71 y.o. [place] woman status post right breast upper outer quadrant and right axillary lymph node biopsy 08/23/2015, both positive for a clinical T1 N2, stage IIIA invasive ductal carcinoma, grade 2, strongly estrogen receptor positive, 5% progesterone receptor positive, with an MIB-120-30% and no HER-2 amplification  (a)  biopsy of a 0.9 cm Right breast satellite nodule 09/13/2015 showed invasive ductal carcinoma, grade 2, E-cadherin strongly positive, with a prognostic panel identical to the larger right upper quadrant mass  (1) left breast biopsy 08/24/2015 shows a complex sclerosing lesion, excised 12/05/2015  (2) neoadjuvant chemotherapy consisting of cyclophosphamide and docetaxel every 21 days 4, with OnPro support, started 09/12/2015, completed 11/14/2015  (3) status post right lumpectomy and sentinel lymph node dissection 12/05/2015 for a residual pT2 pN3, stage IIIC invasive ductal carcinoma, grade 2, with a close anterior margin (skin)  (4) adjuvant radiation 01/18/16 - 11/6/17with capecitabine chemo-sensitization   1) Right breast/nodal areas: 45 Gy in 25 fractions                         2) Right breast boost: 16 Gy in 8 fractions  (5)  capecitabine started 04/13/2016, discontinued January 2018 with mild/moderate side effects developing  (6) anastrozole started 06/01/2016  (7) genetics testing (Askenazi descent) 02/07/2016 through the [place] offered by [facility] Hope Pigeon, MD) i found no deleterious mutations in  ATM, BARD1, BRCA1, BRCA2, BRIP1, CDH1, CHEK2, FANCC, MLH1, MSH2, MSH6, NBN, PALB2, PMS2, PTEN, RAD51C, RAD51D, TP53, and XRCC2.  This panel also includes deletion/duplication analysis (without sequencing) for one gene, EPCAM.  (8) consented to PALLAS trial   PLAN: Nyelli is now a little over half a year out from definitive surgery for her breast cancer, with no evidence of disease recurrence. This is favorable.  She is tolerating the anastrozole well so far. Hot flashes are the main side effect and she does not feel these are a major concern.  Her hair is very slow to come back but it is slowly coming back hopefully at some point she will no longer need to wear a week. I have no problems with her using some Rogaine to speak things up if possible.  I'm going to give her Diflucan for the next 4 days to help her clear the slight fungal rash under both breasts. She can then go back to nizoral as before.  Assuming she is randomized to palbociclib she will have lab work March 21 and April 4. She is going to return to see me April 18 2SS tolerance and to start the next cycle of palbociclib. She does have a good understanding of the possible toxicities, side effects and complications of this agent, some of which were reviewed today.  She knows to call for any other problems that may develop before her next visit.    Chauncey Cruel, MD  07/13/2016

## 2016-07-13 NOTE — Telephone Encounter (Signed)
Gave patient avs report and appointment for April and May. Patient will call Dr. Ammie Ferrier office to schedule gyn appointment.

## 2016-07-13 NOTE — Progress Notes (Signed)
07/13/2016 See Research Consent form encounter for details of today's visit. Cindy S. Brigitte Pulse BSN, RN, CCRP 07/13/2016 1:53 PM

## 2016-07-17 ENCOUNTER — Encounter: Payer: Medicare Other | Admitting: *Deleted

## 2016-07-17 ENCOUNTER — Other Ambulatory Visit: Payer: Self-pay | Admitting: Emergency Medicine

## 2016-07-17 ENCOUNTER — Telehealth: Payer: Self-pay

## 2016-07-17 ENCOUNTER — Encounter: Payer: Self-pay | Admitting: *Deleted

## 2016-07-17 ENCOUNTER — Other Ambulatory Visit (HOSPITAL_BASED_OUTPATIENT_CLINIC_OR_DEPARTMENT_OTHER): Payer: Medicare Other

## 2016-07-17 DIAGNOSIS — Z170 Estrogen receptor positive status [ER+]: Principal | ICD-10-CM

## 2016-07-17 DIAGNOSIS — Z006 Encounter for examination for normal comparison and control in clinical research program: Secondary | ICD-10-CM

## 2016-07-17 DIAGNOSIS — C50411 Malignant neoplasm of upper-outer quadrant of right female breast: Secondary | ICD-10-CM

## 2016-07-17 LAB — CBC WITH DIFFERENTIAL/PLATELET
BASO%: 0.5 % (ref 0.0–2.0)
BASOS ABS: 0.1 10*3/uL (ref 0.0–0.1)
EOS%: 1 % (ref 0.0–7.0)
Eosinophils Absolute: 0.1 10*3/uL (ref 0.0–0.5)
HCT: 41.3 % (ref 34.8–46.6)
HEMOGLOBIN: 13.9 g/dL (ref 11.6–15.9)
LYMPH%: 9.7 % — ABNORMAL LOW (ref 14.0–49.7)
MCH: 34.3 pg — ABNORMAL HIGH (ref 25.1–34.0)
MCHC: 33.7 g/dL (ref 31.5–36.0)
MCV: 101.5 fL — ABNORMAL HIGH (ref 79.5–101.0)
MONO#: 0.8 10*3/uL (ref 0.1–0.9)
MONO%: 6.9 % (ref 0.0–14.0)
NEUT#: 9.6 10*3/uL — ABNORMAL HIGH (ref 1.5–6.5)
NEUT%: 81.9 % — ABNORMAL HIGH (ref 38.4–76.8)
Platelets: 296 10*3/uL (ref 145–400)
RBC: 4.07 10*6/uL (ref 3.70–5.45)
RDW: 13.9 % (ref 11.2–14.5)
WBC: 11.7 10*3/uL — ABNORMAL HIGH (ref 3.9–10.3)
lymph#: 1.1 10*3/uL (ref 0.9–3.3)

## 2016-07-17 LAB — COMPREHENSIVE METABOLIC PANEL
ALBUMIN: 3.9 g/dL (ref 3.5–5.0)
ALK PHOS: 98 U/L (ref 40–150)
ALT: 12 U/L (ref 0–55)
AST: 15 U/L (ref 5–34)
Anion Gap: 11 mEq/L (ref 3–11)
BILIRUBIN TOTAL: 0.86 mg/dL (ref 0.20–1.20)
BUN: 12.9 mg/dL (ref 7.0–26.0)
CO2: 29 mEq/L (ref 22–29)
CREATININE: 0.9 mg/dL (ref 0.6–1.1)
Calcium: 9.9 mg/dL (ref 8.4–10.4)
Chloride: 100 mEq/L (ref 98–109)
EGFR: 65 mL/min/{1.73_m2} — ABNORMAL LOW (ref >90)
GLUCOSE: 72 mg/dL (ref 70–140)
POTASSIUM: 3.8 meq/L (ref 3.5–5.1)
Sodium: 139 mEq/L (ref 136–145)
Total Protein: 7.2 g/dL (ref 6.4–8.3)

## 2016-07-17 LAB — RESEARCH LABS

## 2016-07-17 MED ORDER — CYCLOSPORINE 0.05 % OP EMUL: 1.0000 [drp] | Freq: Two times a day (BID) | OPHTHALMIC | 3 refills | Status: DC

## 2016-07-17 MED ORDER — FLUCONAZOLE 100 MG PO TABS: 100.0000 mg | ORAL_TABLET | Freq: Every day | ORAL | 0 refills | Status: DC

## 2016-07-17 MED ORDER — FLUCONAZOLE 100 MG PO TABS
100.0000 mg | ORAL_TABLET | Freq: Every day | ORAL | 0 refills | Status: DC
Start: 2016-07-17 — End: 2016-07-17

## 2016-07-17 MED ORDER — PROCHLORPERAZINE MALEATE 10 MG PO TABS: 10.0000 mg | ORAL_TABLET | Freq: Four times a day (QID) | ORAL | 1 refills | Status: DC | PRN

## 2016-07-17 NOTE — Progress Notes (Signed)
07/17/2016 Patient in to clinic today for [place] visit. Upon arrival to clinic, PRO questionnaires were completed independently by the patient. Patient then proceeded to lab for collection of C1D1 research blood samples. Patient understands that anastrozole is taken daily without interruption. She reports that she currently takes her anastrozole in the morning and has no significant side effects other than moderate hot flashes. Patient was given the Cycle 1 Anti-hormone therapy diary for recording daily anastrozole doses, noting the time and number of pills taken, and instructions on page 1 of the diary were reviewed with her. Patient is aware that she will be contacted in approximately two weeks for side effect assessments, and that she will make visits to the clinic every four weeks in April and May. Patient was instructed to notify the office of any questions or concerns, or if new symptoms develop. Patient reports that she is taking Wellbutrin for a history of mild depression, started about 14 years ago following her husband's second transplant.  Cindy S. Brigitte Pulse BSN, RN, [place] 07/17/2016 2:38 PM  Baseline toxicities - ongoing Study/Protocol: AFT-05 PALLAS Event Grade Onset Date Treatment/ Comments  Alopecia 2 03/26/16   Anxiety 2 08/31/15 Ativan  Arthritis 2 2008 Relafen  Asthma 2 1953 albuterol  Chronic vaginitis 2 1998 Diflucan  Depression 2 2004 Per 07/17/16 note Rx: Wellbutrin  Diplopia 1 02/07/16   Dry eye 1 04/18/14 Restasis, Systane  Epiphora, intermittent 2 10/17/15   Fatigue 2 12/21/15   Fibromyalgia 2 1982 Soma, pain med  Skin infection, fungal 2 09/21/15 Intermittent; Rx: Diflucan  GERD 2 2011 Protonix  Oral/genital herpes, int. 2 02/06/12 Valtrex  Hot flashes 1 11/21/15   Hypertension 1 02/08/06 Dyazide  Hypothyroidism 2 1964 Synthroid  Insomnia 2 06/14/09 Per 06/07/16 note Rx: Ativan, Ambien  Interstitial cystitis 2 1998 Valium, pain med, Vistaril  Nausea, intermittent 1 04/27/16  Compazine  Osteoporosis 2 12/24/06   Peripheral neuropathy 2 06/14/09 Per 06/07/16 note  Back pain (sciatica) 1 08/31/15 Due to spinal stenosis  Tinnitus 1 08/31/15   Vitamin D deficiency 2 1998 Per 02/06/13 note; Rx: supplement  Seasonal allergies-rhinitis 1 10/17/15   Cindy S. Brigitte Pulse BSN, RN, [facility] 09/14/2016 4:33 PM

## 2016-07-17 NOTE — Telephone Encounter (Signed)
Patients 09/05/16 appointment was moved to 09/12/16 3/3:30 as originally requested.  A new scheduled has been mailed to the patient.   [name]

## 2016-07-18 ENCOUNTER — Telehealth: Payer: Self-pay | Admitting: Adult Health

## 2016-07-18 NOTE — Telephone Encounter (Signed)
Scheduled appt for Survivorship with Charlestine Massed. Patient is aware or new appt added. Reminder letter sent.

## 2016-07-20 ENCOUNTER — Encounter: Payer: Self-pay | Admitting: *Deleted

## 2016-07-31 ENCOUNTER — Telehealth: Payer: Self-pay | Admitting: *Deleted

## 2016-07-31 DIAGNOSIS — C50411 Malignant neoplasm of upper-outer quadrant of right female breast: Secondary | ICD-10-CM

## 2016-07-31 DIAGNOSIS — Z170 Estrogen receptor positive status [ER+]: Principal | ICD-10-CM

## 2016-07-31 NOTE — Telephone Encounter (Signed)
07/31/2016 Spoke with patient by phone today for Cycle 1, Day 14 adverse event assessment. Patient reports no occurrence of new symptoms since enrolling in study. She continues to have fatigue and her usual fibromyalgia symptoms which have been more noticeable, due to caring for her new grandson and her other daughter's new puppy. Confirmed next clinic appointment on 08/15/2016 for lab work, MD visit and research nurse visit. Cindy S. Brigitte Pulse BSN, RN, [place] 07/31/2016 11:31 AM

## 2016-08-15 ENCOUNTER — Other Ambulatory Visit (HOSPITAL_BASED_OUTPATIENT_CLINIC_OR_DEPARTMENT_OTHER): Payer: Medicare Other

## 2016-08-15 ENCOUNTER — Encounter: Payer: Self-pay | Admitting: Pharmacist

## 2016-08-15 ENCOUNTER — Encounter: Payer: Medicare Other | Admitting: *Deleted

## 2016-08-15 ENCOUNTER — Telehealth: Payer: Self-pay | Admitting: Obstetrics & Gynecology

## 2016-08-15 ENCOUNTER — Ambulatory Visit (HOSPITAL_BASED_OUTPATIENT_CLINIC_OR_DEPARTMENT_OTHER): Payer: Medicare Other | Admitting: Oncology

## 2016-08-15 VITALS — BP 135/84 | HR 74 | Temp 97.7°F | Resp 18 | Ht 62.0 in | Wt 151.0 lb

## 2016-08-15 DIAGNOSIS — Z170 Estrogen receptor positive status [ER+]: Secondary | ICD-10-CM | POA: Diagnosis not present

## 2016-08-15 DIAGNOSIS — C50411 Malignant neoplasm of upper-outer quadrant of right female breast: Secondary | ICD-10-CM

## 2016-08-15 DIAGNOSIS — Z79811 Long term (current) use of aromatase inhibitors: Secondary | ICD-10-CM | POA: Diagnosis not present

## 2016-08-15 DIAGNOSIS — L658 Other specified nonscarring hair loss: Secondary | ICD-10-CM | POA: Diagnosis not present

## 2016-08-15 DIAGNOSIS — Z006 Encounter for examination for normal comparison and control in clinical research program: Secondary | ICD-10-CM

## 2016-08-15 LAB — COMPREHENSIVE METABOLIC PANEL
ALT: 16 U/L (ref 0–55)
AST: 17 U/L (ref 5–34)
Albumin: 3.9 g/dL (ref 3.5–5.0)
Alkaline Phosphatase: 103 U/L (ref 40–150)
Anion Gap: 11 mEq/L (ref 3–11)
BUN: 14.2 mg/dL (ref 7.0–26.0)
CHLORIDE: 102 meq/L (ref 98–109)
CO2: 26 meq/L (ref 22–29)
Calcium: 10 mg/dL (ref 8.4–10.4)
Creatinine: 0.9 mg/dL (ref 0.6–1.1)
EGFR: 68 mL/min/{1.73_m2} — AB (ref >90)
GLUCOSE: 88 mg/dL (ref 70–140)
POTASSIUM: 4.1 meq/L (ref 3.5–5.1)
SODIUM: 140 meq/L (ref 136–145)
Total Bilirubin: 0.48 mg/dL (ref 0.20–1.20)
Total Protein: 7.4 g/dL (ref 6.4–8.3)

## 2016-08-15 LAB — CBC WITH DIFFERENTIAL/PLATELET
BASO%: 0.3 % (ref 0.0–2.0)
BASOS ABS: 0 10*3/uL (ref 0.0–0.1)
EOS ABS: 0.1 10*3/uL (ref 0.0–0.5)
EOS%: 1.5 % (ref 0.0–7.0)
HCT: 42.9 % (ref 34.8–46.6)
HGB: 14.6 g/dL (ref 11.6–15.9)
LYMPH%: 16.1 % (ref 14.0–49.7)
MCH: 33.3 pg (ref 25.1–34.0)
MCHC: 34 g/dL (ref 31.5–36.0)
MCV: 97.9 fL (ref 79.5–101.0)
MONO#: 0.6 10*3/uL (ref 0.1–0.9)
MONO%: 7 % (ref 0.0–14.0)
NEUT%: 75.1 % (ref 38.4–76.8)
NEUTROS ABS: 6.6 10*3/uL — AB (ref 1.5–6.5)
Platelets: 291 10*3/uL (ref 145–400)
RBC: 4.38 10*6/uL (ref 3.70–5.45)
RDW: 12.7 % (ref 11.2–14.5)
WBC: 8.8 10*3/uL (ref 3.9–10.3)
lymph#: 1.4 10*3/uL (ref 0.9–3.3)

## 2016-08-15 MED ORDER — FLUCONAZOLE 100 MG PO TABS: 100.0000 mg | ORAL_TABLET | Freq: Every day | ORAL | 0 refills | Status: DC

## 2016-08-15 NOTE — Progress Notes (Signed)
08/15/2016 Patient in to clinic today unaccompanied for Cycle 2 visit. Upon arrival to clinic, patient independently completed her patient questionnaires for PROs and adherence. Patient returned her completed Cycle 1 drug diary for anti-hormone therapy, indicating no missed doses. Patient was given the Cycle 2 drug diary for anti-hormone therapy. Patient reports ongoing complaints from baseline, and has mild vaginal dryness and "random" mild hot flashes. She still has significant alopecia but has noticed some hair growth. She has mild fatigue and rare tearing of her eyes. Discussed need for a study related telephone contact on Day 14 (no clinic visit required for Arm B patients). Patient will then return for the next study related visit on 09/12/16 with subsequent visits every 12 weeks. Patient and MD are in agreement with this plan. Cindy S. Brigitte Pulse BSN, RN, [place] 08/15/2016 3:35 PM   Adverse Event Log Study/Protocol: AFT-05 PALLAS Cycle 1: 07/18/16 - 08/15/16  (cycle end date = 08/14/16) Event Grade Onset Date Resolved Date Attribution to anastrozole Treatment/ Comments  Vaginal dryness Grade 1 08/01/16 ongoing Yes   Urinary incontinence- stress  Grade 1  08/15/16  ongoing No   Fatigue Grade 1 08/15/16 ongoing N/A Improved from grade 2 at baseline  Cindy S. Brigitte Pulse BSN, RN, [place] 10/02/2016 9:58 AM

## 2016-08-15 NOTE — Progress Notes (Signed)
Consent documentation  Study code: [id number]  Met with patient and research nurse following the patients provider visit on 4/18 at 1415, research nurse Ulyses Amor was also present. Provided an overview of the "Pharmacogenetic analysis of toxicities related to administration of taxanes in breast cancer patients" study.  Consent form was reviewed with the patient (reviewed the study purpose, patient's role, possible side effects, cost, risk, information protection) All of the patient's questions were answered and she agreed to participate in the study. A signed copy of the consent form was given to the patient. All eligibility criteria have been met and patient has been enrolled in the study.  Study sample was collected via buccal swab after consent was obtained. Patient was informed that the sample would be sent to the lab for processing after samples were collected from 25 patients and that it would take approximately 2 weeks for the lab to process that sample.   Met with the patient for approximately 15 minutes.  Darl Pikes, PharmD, [place] Clinical Pharmacist- Oncology Pharmacy Resident

## 2016-08-15 NOTE — Telephone Encounter (Signed)
Called and left a message for patient to call back to schedule a new patient doctor referral for AEX. °

## 2016-08-15 NOTE — Progress Notes (Signed)
[place]  Telephone:(336) [phone] Fax:(336) [phone]     ID: MCKINLEIGH SCHUCHART DOB: 05/08/1945  MR#: [id number]  NOI#:[id number]  Patient Care Team: Seward Carol, MD as PCP - General (Internal Medicine) Chauncey Cruel, MD as Consulting Physician (Oncology) Fanny Skates, MD as Consulting Physician (General Surgery) Luberta Robertson, MD (Radiology) Hennie Duos, MD as Consulting Physician (Rheumatology) Domingo Pulse, MD (Urology) Druscilla Brownie, MD as Consulting Physician (Dermatology) Luberta Mutter, MD as Consulting Physician (Ophthalmology) Magnus Sinning, MD as Consulting Physician (Physical Medicine and Rehabilitation) Benson Norway, RN as Registered Nurse (Oncology) OTHER MD:  CHIEF COMPLAINT: Locally advanced breast cancer  CURRENT TREATMENT:  Anastrozole  INTERVAL HISTORY: Charlena returns today for follow-up of her estrogen receptor positive breast cancer accompanied by her study nurse, Ulyses Amor.. She continues on anastrozole, with fair tolerance. She has random hot flashes. They're brief and not a major issue for her. Vaginal dryness is not an major issue. She is obtaining the drug at a good price.  She participated in the PALLAS trial and was randomized to anastrozole alone--no palbociclib.  Today [place] also signed up for a study of possible genetic predisposition to neuropathy. This involves only donating a saliva sample and of course continuing follow-up  REVIEW OF SYSTEMS: Zelta is using Rogaine but her hair really is not feeling and very well. She still has to use a wig. She feels tired, but is walking about a mile most days and doing quite a bit of babysitting further new grandson. She has mild nausea at times, no vomiting, and occasionally takes Compazine for this. She continues to have pain due to her fibromyalgia. She has a runny nose which is seasonal with her. She has stress urinary incontinence. Her back pain is stable. Son from these  issues a detailed review of systems was noncontributory   BREAST CANCER HISTORY: From the original intake note:  Nazifa had routine mammographic screening in 08/18/2015 at [facility], showing a right breast mass and possible axillary adenopathy. On 08/23/2015 she underwent bilateral diagnostic mammography with tomography and bilateral breast ultrasonography. The breast density was category B in the right breast there was a spiculated mass in the upper-outer quadrant measuring 1.2 cm and associated with pleomorphic calcifications. There were calcifications extending anteriorly and posteriorly from the mass worrisome for ductal carcinoma in situ. The total area in question is about 3 cm. There were also multiple enlarged right axillary lymph nodes. The mass was palpated as an area of fullness at the 12:00 position of the breast 3 cm from the nipple. There were palpable enlarged right axillary lymph nodes in the right axilla. Ultrasonography of the right breast confirmed an irregular hypoechoic mass in the area in question measuring 1.6 cm. There were multiple abnormal appearing enlarged right axillary lymph nodes, the largest measuring 2.3 cm.  In the left breast, mammography showed an area of architectural distortion associated with the left nipple. This was not palpable. Ultrasonography of the left breast found no correlate. This area of the left breast was biopsied under tomography 08/24/2015. This showed (SAA [id number]) a complex sclerosing lesion and fibroadenoma.  On 08/23/2015 the patient underwent biopsy of the right breast mass in 1 of the abnormal right axillary lymph nodes. 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 Both biopsies were HER-2 nonamplified, the breast being ratio  1.27 and number per cell 1.90, and the lymph node 1 ratio  1.24 and number per cell 3.0.  On 08/26/2015 the patient underwent bilateral breast MRI. In the left breast there was no suspicious enhancement. The left axilla was benign. In the right breast there was a mass described as in the upper inner quadrant measuring 2.3 cm. Less than a centimeter away from this mass there was a satellite nodule measuring 0.9 cm. In the right axilla there were multiple enlarged right axillary lymph nodes, including a confluent region measuring up to 4 cm. There were also enlarged level II lymph nodes present measuring up to 2.1 cm.  The patient's subsequent history is as detailed below  PAST MEDICAL HISTORY: Past Medical History:  Diagnosis Date  . Arthritis 2008  . Asthma 1952   age 71  . Cancer [facility])    breast cancer  . Chronic vaginitis 1998  . Fibromyalgia 1982  . GERD (gastroesophageal reflux disease)    since at least 2011  . Hemorrhoids 2009  . History of radiation therapy 01/18/16-03/05/16   right breast/nodal areas 45 Gy, right breast boost 16 Gy  . Hot flashes 07/13/2016  . Hypoglycemia 2003  . Hypothyroidism 1964   age 71  . Interstitial cystitis 1998  . Irritable bowel syndrome 06/14/2009   Qualifier: Diagnosis of  By: Patsy Baltimore RN, Langley Gauss    . Menopausal symptoms   . Osteoporosis 12/24/2006  . Pneumonia 1979   varicella  . PONV (postoperative nausea and vomiting) 1979    PAST SURGICAL HISTORY: Past Surgical History:  Procedure Laterality Date  . BLADDER HYPEREXTENSION     X4, prior to 01/2006  . BREAST LUMPECTOMY WITH NEEDLE LOCALIZATION AND AXILLARY LYMPH NODE DISSECTION Right 12/05/2015   Procedure: RIGHT BREAST NEEDLE LOCALIZED (WIRES X 2) LUMPECTOMY WITH AXILLARY LYMPH NODE DISSECTION;  Surgeon: Fanny Skates, MD;  Location: [place];  Service: General;  Laterality: Right;  . BREAST LUMPECTOMY WITH RADIOACTIVE SEED LOCALIZATION Left 12/05/2015   Procedure: RADIOACTIVE SEED GUIDED LEFT BREAST LUMPECTOMY;  Surgeon: Fanny Skates, MD;   Location: [place];  Service: General;  Laterality: Left;  . BUNIONECTOMY     prior to 01/2006  . CESAREAN SECTION  1979  . CHOLECYSTECTOMY  02/08/2006   DR. INGRAM  . COLONOSCOPY    . DENTAL SURGERY  2009-2016   13 dental implants  . LEG SURGERY  07/06/2004   Broken tibia  . MOUTH SURGERY  2016   multiple  . NASAL SINUS SURGERY     X4; prior to 01/2006  . PORT-A-CATH REMOVAL Right 12/05/2015   Procedure: REMOVAL PORT-A-CATH;  Surgeon: Fanny Skates, MD;  Location: [place];  Service: General;  Laterality: Right;  . PORTACATH PLACEMENT Left 09/08/2015   Procedure: INSERTION OF PORT-A-CATH ;  Surgeon: Fanny Skates, MD;  Location: [facility] OR;  Service: General;  Laterality: Left;    FAMILY HISTORY Family History  Problem Relation Age of Onset  . Hypertension Mother   . Heart disease Mother     d. 29  . Parkinsonism Father     d. 46  . Heart disease Sister   . Breast cancer Sister 75    s/p mastectomy; reportedly negative BRCA1/2 testing approx 7 years ago  . Melanoma Sister     d. 37s; hx of incresed sun exposure  . Cancer Sister     dx. sarcoma of her face in her 57s; treated w/ MOHS surgery  . Parkinsonism Paternal Aunt  d. 90s  . Parkinsonism Paternal Uncle     d. late 66s  . Kidney failure Maternal Grandmother 56  . Heart attack Maternal Grandfather     d. 58s  . Heart disease Paternal Grandmother     d. 85  . Breast cancer Other 78    maternal great aunt (MGF's sister)  . Kidney cancer Paternal Uncle 87    d. 75s; unsure about smoking status  The patient's father died at age 61 from complications of Parkinson's disease. The patient's mother died age 73 with heart disease. Nikeshia had no brothers. She had 2 sisters. One sister was diagnosed with breast cancer at age 12. She was tested for the BRCA gene and was negative. The same sister also had melanoma diagnosed in her 62s. The patient has one maternal great aunt diagnosed with breast cancer around age 50. There is no  history of ovarian cancer in the family  GYNECOLOGIC HISTORY:  No LMP recorded. Patient is postmenopausal. Menarche age 44, first live birth age 30. The patient is GX P2. She stopped having periods in her early 21s and took hormone replacement until her breast cancer diagnosis, April 2017.  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 He has been in remission for more than 10 years. Their daughter Nira Conn lives in [place] were she is Production designer, theatre/television/film of the [employer]. Daughter Belenda Cruise is an infectious disease M.D. working and teaching at [employer] in [place]. The patient has one granddaughter, 33 months old as of May 2017. The family attends [place]: Not in place   HEALTH MAINTENANCE: Social History  Substance Use Topics  . Smoking status: Former Smoker    Packs/day: 0.25    Years: 15.00    Types: Cigarettes  . Smokeless tobacco: Never Used     Comment: social smoker/"on and off"  . Alcohol use 0.0 oz/week     Comment: social     Colonoscopy: 2016/ [facility]  PAP: 2015  Bone density:   Lipid panel:  Allergies  Allergen Reactions  . Nitrofurantoin Other (See Comments)    Unknown reaction  . Penicillins Other (See Comments)    Has patient had a PCN reaction causing immediate rash, facial/tongue/throat swelling, SOB or lightheadedness with hypotension: No Has patient had a PCN reaction causing severe rash involving mucus membranes or skin necrosis: No Has patient had a PCN reaction that required hospitalization No Has patient had a PCN reaction occurring within the last 10 years: No If all of the above answers are "NO", then may proceed with Cephalosporin use.  . Chlorhexidine Other (See Comments)    [place] IS OK. The CLOTH WIPE causes reaction.  Causes itching all over the area  . Other Rash    Adhesive tape    Current Outpatient Prescriptions  Medication Sig Dispense Refill  . albuterol (PROVENTIL HFA;VENTOLIN HFA) 108 (90 Base) MCG/ACT inhaler Inhale 1-2 puffs into the lungs every 6 (six) hours as needed for wheezing or shortness of breath. Started in 1998    . anastrozole (ARIMIDEX) 1 MG tablet Take 1 tablet (1 mg total) by mouth daily. 90 tablet 4  . buPROPion (WELLBUTRIN XL) 300 MG 24 hr tablet Take 300 mg by mouth daily. Started in 2014    . Calcium Carbonate-Vitamin  D (CALCIUM + D PO) Take 1 tablet by mouth at bedtime. Started approximately 1998    . carisoprodol (SOMA) 350 MG tablet Take 350 mg by mouth 2 (two) times daily as needed for muscle spasms. Started in 2008.  2  . cycloSPORINE (RESTASIS) 0.05 % ophthalmic emulsion Place 1 drop into both eyes 2 (two) times daily. 5.5 mL 3  . diazepam (VALIUM) 5 MG tablet Take 5 mg by mouth every 6 (six) hours as needed for muscle spasms (interstitial cystitis). Started approximately 2008.    . diazepam (VALIUM) 5 MG tablet Take 1 or 2 tablets before MRI study (Patient not taking: Reported on 07/05/2016) 4 tablet 0  . fluconazole (DIFLUCAN) 100 MG tablet Take 1 tablet (100 mg total) by mouth daily. 10 tablet 0  . HYDROcodone-acetaminophen (NORCO) 10-325 MG tablet Take 1 tablet by mouth every 4 (four) hours as needed for moderate pain or severe pain (as needed for fibromyalgia and IC).     . hydrOXYzine (ATARAX/VISTARIL) 25 MG tablet Take 1 tablet (25 mg total) by mouth 3 (three) times daily as needed for anxiety or itching. 30 tablet 0  . levothyroxine (SYNTHROID) 137 MCG tablet Take 137 mcg by mouth daily.     Marland Kitchen LORazepam (ATIVAN) 0.5 MG tablet Take 1 tablet (0.5 mg total) by mouth at bedtime as needed (Nausea or vomiting). 30 tablet 0  . Multiple Vitamin (MULTIVITAMIN) capsule Take 1 capsule by mouth daily. Taking since at least 1988.    . nabumetone (RELAFEN) 750 MG tablet Take 750 mg  by mouth 2 (two) times daily as needed for mild pain. Taking since 2006.    . pantoprazole (PROTONIX) 40 MG tablet Take 40 mg by mouth daily. Started in 2012.    Vladimir Faster Glycol-Propyl Glycol (SYSTANE OP) Place 1 drop into both eyes as needed (for dry eyes). Has taken since at least 2015.    . prochlorperazine (COMPAZINE) 10 MG tablet Take 1 tablet (10 mg total) by mouth every 6 (six) hours as needed (Nausea or vomiting). 30 tablet 1  . triamterene-hydrochlorothiazide (DYAZIDE) 37.5-25 MG capsule Take 1 tablet by mouth daily. Has taken since at least 2012.    . valACYclovir (VALTREX) 500 MG tablet Take 1 tablet (500 mg total) by mouth 2 (two) times daily. (Patient not taking: Reported on 07/05/2016) 60 tablet 1  . Vitamin D, Ergocalciferol, (DRISDOL) 50000 UNITS CAPS capsule One tablet every other week (Patient taking differently: Take 50,000 Units by mouth every 14 (fourteen) days. One tablet every other week) 10 capsule 3  . zolpidem (AMBIEN) 10 MG tablet Take 10 mg by mouth at bedtime as needed for sleep (since 2009).      Current Facility-Administered Medications  Medication Dose Route Frequency Provider Last Rate Last Dose  . lidocaine (PF) (XYLOCAINE) 1 % injection 0.3 mL  0.3 mL Other Once Magnus Sinning, MD      . lidocaine (PF) (XYLOCAINE) 1 % injection 0.3 mL  0.3 mL Other Once Magnus Sinning, MD        OBJECTIVE: Middle-aged white woman In no acute distress  Vitals:   08/15/16 1415  BP: 135/84  Pulse: 74  Resp: 18  Temp: 97.7 F (36.5 C)     Body mass index is 27.62 kg/m.    ECOG FS:1 - Symptomatic but completely ambulatory  Sclerae unicteric, EOMs intact Oropharynx clear and moist No cervical or supraclavicular adenopathy Lungs no rales or rhonchi Heart regular rate and rhythm Abd soft, nontender, positive  bowel sounds MSK no focal spinal tenderness, no upper extremity lymphedema Neuro: nonfocal, well oriented, appropriate affect Breasts: The right breast is status  post lumpectomy followed by radiation, with no evidence of local recurrence. There is some residual erythema in the breast and some erythema in the inframammary fold as well. These are imaged below. The left breast is unremarkable. Both axillae are benign.   Right breast 08/15/2016     LAB RESULTS:  CMP     Component Value Date/Time   NA 140 08/15/2016 1350   K 4.1 08/15/2016 1350   CL 101 12/15/2015 2044   CO2 26 08/15/2016 1350   GLUCOSE 88 08/15/2016 1350   BUN 14.2 08/15/2016 1350   CREATININE 0.9 08/15/2016 1350   CALCIUM 10.0 08/15/2016 1350   PROT 7.4 08/15/2016 1350   ALBUMIN 3.9 08/15/2016 1350   AST 17 08/15/2016 1350   ALT 16 08/15/2016 1350   ALKPHOS 103 08/15/2016 1350   BILITOT 0.48 08/15/2016 1350   GFRNONAA >60 12/15/2015 2044   GFRAA >60 12/15/2015 2044    INo results found for: SPEP, UPEP  Lab Results  Component Value Date   WBC 8.8 08/15/2016   NEUTROABS 6.6 (H) 08/15/2016   HGB 14.6 08/15/2016   HCT 42.9 08/15/2016   MCV 97.9 08/15/2016   PLT 291 08/15/2016      Chemistry      Component Value Date/Time   NA 140 08/15/2016 1350   K 4.1 08/15/2016 1350   CL 101 12/15/2015 2044   CO2 26 08/15/2016 1350   BUN 14.2 08/15/2016 1350   CREATININE 0.9 08/15/2016 1350      Component Value Date/Time   CALCIUM 10.0 08/15/2016 1350   ALKPHOS 103 08/15/2016 1350   AST 17 08/15/2016 1350   ALT 16 08/15/2016 1350   BILITOT 0.48 08/15/2016 1350       No results found for: LABCA2  No components found for: LABCA125  No results for input(s): INR in the last 168 hours.  Urinalysis    Component Value Date/Time   COLORURINE YELLOW 12/15/2015 2208   APPEARANCEUR CLEAR 12/15/2015 2208   LABSPEC 1.014 12/15/2015 2208   PHURINE 6.5 12/15/2015 2208   GLUCOSEU NEGATIVE 12/15/2015 2208   HGBUR NEGATIVE 12/15/2015 2208   BILIRUBINUR NEGATIVE 12/15/2015 2208   KETONESUR NEGATIVE 12/15/2015 2208   PROTEINUR NEGATIVE 12/15/2015 2208   NITRITE  NEGATIVE 12/15/2015 2208   LEUKOCYTESUR NEGATIVE 12/15/2015 2208      ELIGIBLE FOR AVAILABLE RESEARCH PROTOCOL: PALLAS, neuropathy study  STUDIES: She will be due for repeat mammography in July.  ASSESSMENT: 71 y.o. [place] woman status post right breast upper outer quadrant and right axillary lymph node biopsy 08/23/2015, both positive for a clinical T1 N2, stage IIIA invasive ductal carcinoma, grade 2, strongly estrogen receptor positive, 5% progesterone receptor positive, with an MIB-120-30% and no HER-2 amplification  (a) biopsy of a 0.9 cm Right breast satellite nodule 09/13/2015 showed invasive ductal carcinoma, grade 2, E-cadherin strongly positive, with a prognostic panel identical to the larger right upper quadrant mass  (1) left breast biopsy 08/24/2015 shows a complex sclerosing lesion, excised 12/05/2015  (2) neoadjuvant chemotherapy consisting of cyclophosphamide and docetaxel every 21 days 4, with OnPro support, started 09/12/2015, completed 11/14/2015  (3) status post right lumpectomy and sentinel lymph node dissection 12/05/2015 for a residual pT2 pN3, stage IIIC invasive ductal carcinoma, grade 2, with a close anterior margin (skin)  (4) adjuvant radiation 01/18/16 - 11/6/17with capecitabine chemo-sensitization   1)  Right breast/nodal areas: 45 Gy in 25 fractions                         2) Right breast boost: 16 Gy in 8 fractions  (5) capecitabine started 04/13/2016, discontinued January 2018 with mild/moderate side effects developing  (6) anastrozole started 06/01/2016  (7) genetics testing ([place] descent) 02/07/2016 through the [place] offered by [facility] Hope Pigeon, MD) i found no deleterious mutations in  ATM, BARD1, BRCA1, BRCA2, BRIP1, CDH1, CHEK2, FANCC, MLH1, MSH2, MSH6, NBN, PALB2, PMS2, PTEN, RAD51C, RAD51D, TP53, and XRCC2.  This panel also includes deletion/duplication analysis (without sequencing) for one gene,  EPCAM.  (8) consented to PALLAS trial   (a) randomized to anastrozole alone  PLAN: Denyse is just a little short of a year out from definitive surgery for her breast cancer was no evidence of disease recurrence. This is favorable.  She is tolerating the anastrozole well. The plan will be to continue that for a minimum of 5 years, more likely 7.  It is unfortunate that her hair is not coming back more vigorously. I am hopeful she will not be one of those cases where they her simply does not come back. She is not enjoying wearing wakes.  I appreciated her participation in the second study, which will give [name] information on the risk of neuropathy from taxanes and may eventually help [name] avoid that problem  She is going to see me again in early August. She will have a repeat mammogram shortly before that. She knows to call for any problems that may develop before the next visit.     Chauncey Cruel, MD  08/15/2016

## 2016-08-15 NOTE — Telephone Encounter (Signed)
Patient said she is returning a call to [place]. No open telephone note. Doctor referral?

## 2016-08-28 ENCOUNTER — Telehealth: Payer: Self-pay | Admitting: *Deleted

## 2016-08-28 NOTE — Telephone Encounter (Signed)
08/28/2016 Left message on patient's home voice mail regarding the 14-day follow-up call in PALLAS study cycle 2. Requested that patient return my call at [phone] to let me know about any new side effects or symptoms to report since her last clinic visit. Cindy S. Brigitte Pulse BSN, RN, [place] 08/28/2016 10:39 AM

## 2016-08-28 NOTE — Telephone Encounter (Signed)
08/28/2016 Received return phone call from patient. She states that she has no new side effects to report, and that she is continuing to complete her anti-hormone therapy diary as instructed. Confirmed next study visit on 09/12/2016. Cindy S. Brigitte Pulse BSN, RN, CCRP 08/28/2016 3:32 PM

## 2016-09-05 ENCOUNTER — Other Ambulatory Visit: Payer: Medicare Other

## 2016-09-05 ENCOUNTER — Ambulatory Visit: Payer: Medicare Other | Admitting: Oncology

## 2016-09-07 ENCOUNTER — Encounter: Payer: Self-pay | Admitting: Obstetrics & Gynecology

## 2016-09-07 ENCOUNTER — Ambulatory Visit (INDEPENDENT_AMBULATORY_CARE_PROVIDER_SITE_OTHER): Payer: Medicare Other | Admitting: Obstetrics & Gynecology

## 2016-09-07 ENCOUNTER — Other Ambulatory Visit (HOSPITAL_COMMUNITY)
Admission: RE | Admit: 2016-09-07 | Discharge: 2016-09-07 | Disposition: A | Discharge status: Home / Self Care | Payer: Medicare Other | Source: Ambulatory Visit | Attending: Obstetrics & Gynecology | Admitting: Obstetrics & Gynecology

## 2016-09-07 VITALS — BP 122/70 | HR 80 | Resp 16 | Ht 62.75 in | Wt 148.0 lb

## 2016-09-07 DIAGNOSIS — L659 Nonscarring hair loss, unspecified: Secondary | ICD-10-CM | POA: Diagnosis not present

## 2016-09-07 DIAGNOSIS — Z124 Encounter for screening for malignant neoplasm of cervix: Secondary | ICD-10-CM | POA: Diagnosis not present

## 2016-09-07 DIAGNOSIS — Z01419 Encounter for gynecological examination (general) (routine) without abnormal findings: Secondary | ICD-10-CM

## 2016-09-07 DIAGNOSIS — R3915 Urgency of urination: Secondary | ICD-10-CM

## 2016-09-07 DIAGNOSIS — N898 Other specified noninflammatory disorders of vagina: Secondary | ICD-10-CM

## 2016-09-07 DIAGNOSIS — Z0000 Encounter for general adult medical examination without abnormal findings: Secondary | ICD-10-CM

## 2016-09-07 DIAGNOSIS — N3010 Interstitial cystitis (chronic) without hematuria: Secondary | ICD-10-CM

## 2016-09-07 LAB — POCT URINALYSIS DIPSTICK
BILIRUBIN UA: NEGATIVE
Glucose, UA: NEGATIVE
Ketones, UA: NEGATIVE
NITRITE UA: NEGATIVE
PH UA: 7 (ref 5.0–8.0)

## 2016-09-07 MED ORDER — SULFAMETHOXAZOLE-TRIMETHOPRIM 800-160 MG PO TABS: 1.0000 | ORAL_TABLET | Freq: Two times a day (BID) | ORAL | 0 refills | Status: DC

## 2016-09-07 MED ORDER — VITAMIN D (ERGOCALCIFEROL) 1.25 MG (50000 UNIT) PO CAPS: 50000.0000 [IU] | ORAL_CAPSULE | ORAL | 3 refills | Status: DC

## 2016-09-07 MED ORDER — CLOTRIMAZOLE-BETAMETHASONE 1-0.05 % EX CREA: 1.0000 "application " | TOPICAL_CREAM | Freq: Two times a day (BID) | CUTANEOUS | 2 refills | Status: DC

## 2016-09-07 MED ORDER — TERCONAZOLE 0.4 % VA CREA
1.0000 | TOPICAL_CREAM | Freq: Every day | VAGINAL | 0 refills | Status: DC
Start: 2016-09-07 — End: 2016-10-01

## 2016-09-07 NOTE — Progress Notes (Signed)
71 y.o. [id number] MarriedCaucasianF here for annual exam/new patient exam.  Pt wih hx of breast cancer 8/17 being followed currently by Dr. Jana Hakim.  Treated with lumpectomy, chemotherapy and radiation.  Had 15/17positive lymph nodes.  Stage 3.  States she is in a clinical trial for medication typically used in stage 4 disease.  She is not receiving the medication so Dr. Jana Hakim is "following me very closely".  Now on Arimidex.  History of osteoporosis with last BMD with t score -2.5 in spine done 9/13.  States she and Dr. Jana Hakim have not discussed this very much, yet.  Not sure when BMD follow up will be recommended.  Will need MMG late summer/early fall.  Had alopecia with her chemotherapy.  Hair has come back very thin and she is still wearing wigs.  She is on Rogaine but would like some additional recommendations.  D/w her another pt of mine who had very significant change with hair growth.  Would recommend she see Lois Huxley at [facility] but the appt will be months out and she can all for cancellations.  Pt very appreciative.    Pt with complaint of possible UTI vs significant vaginitis.  she is having urinary urgency and some dysuria.  Denies hematuria.  Thinks she may have had some low grade temps the last couple of days.  Symptoms have been on-going for weeks.  States she almost went to urgent care due to discomfort.  States she has recurrent issues with yeast and bacterial change.  Dr. Cherylann Banas (prior gyn who has been retired now for four or five years, at least) gave on standing prescriptions for metrogel and diflucan as well as topical betamethasone/clotrimazole.  Advised pt I do not typically give standing prescriptions for yeast vaginitis until I know what is actually occurring with the patient, so I'd like to try and get to the cause and treat it before just letting her guess what is occurring and self treating.  She is ok with this plan right now.  On a personal note, her favorite place to  travel is [place] and she's wearing a cute owl necklace today she got on one of her trips.  Has been there 11 times in 11 years.    PCP:  Dr. Delfina Redwood.  Last blood work was in December.  Patient's last menstrual period was 04/30/1996 (approximate).          Sexually active: Yes.    The current method of family planning is post menopausal status.    Exercising: Yes.    walking Smoker:  Former   Health Maintenance: Pap:  02/06/12 Negative  History of abnormal Pap:  no MMG: 08/24/15 Left breast Bx complex sclerosis lesion. 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 12/05/15 Bilateral Breast Lumpectomy.  Colonoscopy:  01/01/14 f/u 5 years.  BMD:   01/15/12 Osteoporosis  TDaP: 2016 Pneumonia vaccine(s):  01/2014  Zostavax: Not indicated by doctor Hep C testing: No Screening Labs: PCP, Urine today: WBC=Mod, RBC=Trace, Protein=Trace   reports that she has quit smoking. Her smoking use included Cigarettes. She has a 3.75 pack-year smoking history. She has never used smokeless tobacco. She reports that she drinks alcohol. She reports that she does not use drugs.  Past Medical History:  Diagnosis Date  . Anxiety   . Arthritis 2008  . Asthma 1952   age 69  . Cancer [facility])    breast cancer  . Chronic vaginitis 1998  . Fibromyalgia 1982  . GERD (gastroesophageal reflux disease)  since at least 2011  . Hemorrhoids 2009  . History of radiation therapy 01/18/16-03/05/16   right breast/nodal areas 45 Gy, right breast boost 16 Gy  . Hot flashes 07/13/2016  . Hypoglycemia 2003  . Hypothyroidism 1964   age 29  . Interstitial cystitis 1998  . Irritable bowel syndrome 06/14/2009   Qualifier: Diagnosis of  By: Patsy Baltimore RN, Langley Gauss    . Menopausal symptoms   . Osteoporosis 12/24/2006  . Pneumonia 1979   varicella  . PONV (postoperative nausea and vomiting) 1979    Past Surgical History:  Procedure Laterality Date  . BLADDER HYPEREXTENSION     X4, prior to 01/2006  . BLADDER  SUSPENSION    . BREAST LUMPECTOMY WITH NEEDLE LOCALIZATION AND AXILLARY LYMPH NODE DISSECTION Right 12/05/2015   Procedure: RIGHT BREAST NEEDLE LOCALIZED (WIRES X 2) LUMPECTOMY WITH AXILLARY LYMPH NODE DISSECTION;  Surgeon: Fanny Skates, MD;  Location: [place];  Service: General;  Laterality: Right;  . BREAST LUMPECTOMY WITH RADIOACTIVE SEED LOCALIZATION Left 12/05/2015   Procedure: RADIOACTIVE SEED GUIDED LEFT BREAST LUMPECTOMY;  Surgeon: Fanny Skates, MD;  Location: [place];  Service: General;  Laterality: Left;  . BREAST SURGERY    . BUNIONECTOMY     prior to 01/2006  . CESAREAN SECTION  1979  . CHOLECYSTECTOMY  02/08/2006   DR. INGRAM  . COLONOSCOPY    . DENTAL SURGERY  2009-2016   13 dental implants  . LEG SURGERY  07/06/2004   Broken tibia  . MOUTH SURGERY  2016   multiple  . NASAL SINUS SURGERY     X4; prior to 01/2006  . PORT-A-CATH REMOVAL Right 12/05/2015   Procedure: REMOVAL PORT-A-CATH;  Surgeon: Fanny Skates, MD;  Location: [place];  Service: General;  Laterality: Right;  . PORTACATH PLACEMENT Left 09/08/2015   Procedure: INSERTION OF PORT-A-CATH ;  Surgeon: Fanny Skates, MD;  Location: [place];  Service: General;  Laterality: Left;    Current Outpatient Prescriptions  Medication Sig Dispense Refill  . albuterol (PROVENTIL HFA;VENTOLIN HFA) 108 (90 Base) MCG/ACT inhaler Inhale 1-2 puffs into the lungs every 6 (six) hours as needed for wheezing or shortness of breath. Started in 1998    . anastrozole (ARIMIDEX) 1 MG tablet Take 1 tablet (1 mg total) by mouth daily. 90 tablet 4  . buPROPion (WELLBUTRIN XL) 300 MG 24 hr tablet Take 300 mg by mouth daily. Started in 2014    . Calcium Carbonate-Vitamin D (CALCIUM + D PO) Take 1 tablet by mouth at bedtime. Started approximately 1998    . carisoprodol (SOMA) 350 MG tablet Take 350 mg by mouth 2 (two) times daily as needed for muscle spasms. Started in 2008.  2  . cycloSPORINE (RESTASIS) 0.05 % ophthalmic emulsion Place 1 drop into  both eyes 2 (two) times daily. 5.5 mL 3  . diazepam (VALIUM) 5 MG tablet Take 5 mg by mouth every 6 (six) hours as needed for muscle spasms (interstitial cystitis). Started approximately 2008.    Marland Kitchen HYDROcodone-acetaminophen (NORCO) 10-325 MG tablet Take 1 tablet by mouth every 4 (four) hours as needed for moderate pain or severe pain (as needed for fibromyalgia and IC).     . hydrOXYzine (ATARAX/VISTARIL) 25 MG tablet Take 1 tablet (25 mg total) by mouth 3 (three) times daily as needed for anxiety or itching. 30 tablet 0  . levothyroxine (SYNTHROID) 137 MCG tablet Take 137 mcg by mouth daily.     Marland Kitchen LORazepam (ATIVAN) 2 MG tablet Take 1  tablet by mouth daily as needed.  5  . Multiple Vitamin (MULTIVITAMIN) capsule Take 1 capsule by mouth daily. Taking since at least 1988.    . ondansetron (ZOFRAN) 4 MG tablet Take 1 tablet by mouth daily as needed.  3  . pantoprazole (PROTONIX) 40 MG tablet Take 40 mg by mouth daily. Started in 2012.    Vladimir Faster Glycol-Propyl Glycol (SYSTANE OP) Place 1 drop into both eyes as needed (for dry eyes). Has taken since at least 2015.    . prochlorperazine (COMPAZINE) 10 MG tablet Take 1 tablet (10 mg total) by mouth every 6 (six) hours as needed (Nausea or vomiting). 30 tablet 1  . triamterene-hydrochlorothiazide (DYAZIDE) 37.5-25 MG capsule Take 1 tablet by mouth daily. Has taken since at least 2012.    . zolpidem (AMBIEN) 10 MG tablet Take 10 mg by mouth at bedtime as needed for sleep (since 2009).     . fluconazole (DIFLUCAN) 100 MG tablet Take 1 tablet (100 mg total) by mouth daily. (Patient not taking: Reported on 09/07/2016) 10 tablet 0   No current facility-administered medications for this visit.     Family History  Problem Relation Age of Onset  . Hypertension Mother   . Heart disease Mother        d. 28  . Parkinsonism Father        d. 51  . Heart disease Sister   . Breast cancer Sister 47       s/p mastectomy; reportedly negative BRCA1/2 testing  approx 7 years ago  . Melanoma Sister        d. 24s; hx of incresed sun exposure  . Cancer Sister        dx. sarcoma of her face in her 33s; treated w/ MOHS surgery  . Parkinsonism Paternal Aunt        d. 90s  . Parkinsonism Paternal Uncle        d. late 54s  . Kidney failure Maternal Grandmother 32  . Heart attack Maternal Grandfather        d. 20s  . Heart disease Paternal Grandmother        d. 83  . Breast cancer Other 67       maternal great aunt (MGF's sister)  . Kidney cancer Paternal Uncle 50       d. 37s; unsure about smoking status    ROS:  Pertinent items are noted in HPI.  Otherwise, a comprehensive ROS was negative.  Exam:   BP 122/70 (BP Location: Left Arm, Patient Position: Sitting, Cuff Size: Normal)   Pulse 80   Resp 16   Ht 5' 2.75" (1.594 m)   Wt 148 lb (67.1 kg)   LMP 04/30/1996 (Approximate)   BMI 26.43 kg/m    Height: 5' 2.75" (159.4 cm)  Ht Readings from Last 3 Encounters:  09/07/16 5' 2.75" (1.594 m)  08/15/16 5' 2"  (1.575 m)  07/13/16 5' 2"  (1.575 m)    General appearance: alert, cooperative and appears stated age Head: Normocephalic, without obvious abnormality, atraumatic Neck: no adenopathy, supple, symmetrical, trachea midline and thyroid normal to inspection and palpation Lungs: clear to auscultation bilaterally Breasts: Left breast with scar at edge of areola on the left, no masses, skin changes, LAD, nipple discharge noted.  Right breast with axillary scar that is thicker on the inferior edge and more medially.  Breast scar is thickened due to radiation changes that essentially encompass the entire breast.  No discrete masses, LAD  noted. Heart: regular rate and rhythm Abdomen: soft, non-tender; bowel sounds normal; no masses,  no organomegaly Extremities: extremities normal, atraumatic, no cyanosis or edema Skin: Skin color, texture, turgor normal. No rashes or lesions Lymph nodes: Cervical, supraclavicular, and axillary nodes normal. No  abnormal inguinal nodes palpated Neurologic: Grossly normal  Pelvic: External genitalia:  no lesions              Urethra:  normal appearing urethra with no masses, tenderness or lesions              Bartholins and Skenes: normal                 Vagina: normal appearing vagina with erythematous appearing vagina and brownish/yellowish, sticky vaginal discharge c/w atrophic vaginitis              Cervix: no lesions              Pap taken: Yes.   Bimanual Exam:  Uterus:  normal size, contour, position, consistency, mobility, non-tender              Adnexa: normal adnexa and no mass, fullness, tenderness               Rectovaginal: Confirms               Anus:  normal sphincter tone, no lesions  Chaperone was present for exam.  A:  Well Woman with normal exam Stage 3 invasive ductal cancer, s/p lumpectomy and LND on the right, chemo/radiation.  On arimidex Osteoporosis in spine with last BMD 2013 Significant atrophic vaginitis Possible UTI but I think more likely is above diagnosis (or IC) Significant alopecia since chemo Fibromyalgia Asthma Interstitial cystitis   P:   Mammogram guidelines reviewed Will communicate with Dr. Jana Hakim about next BMD for pt Will treat today with Terazol per her request and Affirm testing is obtained.  I suspect this will be negative as she most likely has atrophic vaginitis.  Arimidex may be contributing.  Cannot treat with estrogen, if possible.  Will start with Vit E vaginal suppositories compounded at [facility]. Urine culture pending.  Bactrim DS bid x 5 days given.  Pt may need to stop this but want to treat as she is symptomatic pap smear obtained Rx for clotrimazole/betamethasone cream to be used topically underneath breasts.  Will likely need follow up in a few weeks to a few months depending on test results from above Plan to have pt return yearly, if possible, as well  Lengthy visit with pt due to complicated PMHx and sympotms that pt has  often self treated.  In total, an hour was spent with her with over half of this time spent discussing issues that are documented above.

## 2016-09-08 LAB — WET PREP BY MOLECULAR PROBE
CANDIDA SPECIES: NOT DETECTED
Gardnerella vaginalis: NOT DETECTED
TRICHOMONAS VAG: NOT DETECTED

## 2016-09-10 LAB — URINE CULTURE

## 2016-09-10 LAB — CYTOLOGY - PAP: DIAGNOSIS: NEGATIVE

## 2016-09-11 ENCOUNTER — Telehealth: Payer: Self-pay | Admitting: *Deleted

## 2016-09-11 MED ORDER — NITROFURANTOIN MONOHYD MACRO 100 MG PO CAPS: 100.0000 mg | ORAL_CAPSULE | Freq: Two times a day (BID) | ORAL | 0 refills | Status: DC

## 2016-09-11 NOTE — Telephone Encounter (Signed)
-----   Message from Megan Salon, MD sent at 09/10/2016  5:08 PM EDT ----- Please call pt and let her know her urine culture showed e coli.  It is resistant to almost all oral antibioitcs.  It is sensitive to macrobid/nitrofurantoin.  Can she recall what her allergy to this is?  It is not documented in the allergy section.  If it is not severe, she needs to change to 100mg  bid x 7 days.  Ok to call into pharmacy and then recheck urine culture in two weeks.  Orders have not been placed.  Also pap normal.  02 recall  CC:  [employer].

## 2016-09-11 NOTE — Telephone Encounter (Signed)
Patient states she had a reaction about 20 years ago and does not remember exactly what it was but thinks it was a mild side effect where she would be able to stop medicine of she gets reaction again.  Patient states she is having discharge and bleeding with urination and feels really bad today.   Per Gay Filler, RN we can offer her an OV today if discharge is worse than fridays' or try to take abx for two days and see of she feels better.   Patient states she will take abx and made appt for Thursday to follow up. Rx sent to [facility].   Advised patient to stop abx if she has allergic reaction and to call us for any questions. Patient verbalized understanding.  Dr. Lestine Box Encounter closed.

## 2016-09-11 NOTE — Telephone Encounter (Signed)
Affirm results:  Notes recorded by Megan Salon, MD on 09/08/2016 at 6:35 AM EDT Notified pt of negative results via [facility]cart. Pt needs follow up appt in 2-3 months. Please call to schedule.   Left voicemail for patient to call back.

## 2016-09-12 ENCOUNTER — Encounter: Payer: Medicare Other | Admitting: *Deleted

## 2016-09-12 ENCOUNTER — Encounter: Payer: Self-pay | Admitting: Gynecology

## 2016-09-12 ENCOUNTER — Other Ambulatory Visit (HOSPITAL_BASED_OUTPATIENT_CLINIC_OR_DEPARTMENT_OTHER): Payer: Medicare Other

## 2016-09-12 ENCOUNTER — Ambulatory Visit (HOSPITAL_BASED_OUTPATIENT_CLINIC_OR_DEPARTMENT_OTHER): Payer: Medicare Other | Admitting: Oncology

## 2016-09-12 VITALS — BP 132/67 | HR 57 | Temp 98.0°F | Resp 19 | Ht 62.75 in | Wt 151.1 lb

## 2016-09-12 DIAGNOSIS — N760 Acute vaginitis: Secondary | ICD-10-CM

## 2016-09-12 DIAGNOSIS — Z170 Estrogen receptor positive status [ER+]: Secondary | ICD-10-CM

## 2016-09-12 DIAGNOSIS — C50411 Malignant neoplasm of upper-outer quadrant of right female breast: Secondary | ICD-10-CM

## 2016-09-12 DIAGNOSIS — M797 Fibromyalgia: Secondary | ICD-10-CM

## 2016-09-12 DIAGNOSIS — Z79811 Long term (current) use of aromatase inhibitors: Secondary | ICD-10-CM | POA: Diagnosis not present

## 2016-09-12 DIAGNOSIS — M1990 Unspecified osteoarthritis, unspecified site: Secondary | ICD-10-CM | POA: Diagnosis not present

## 2016-09-12 DIAGNOSIS — N390 Urinary tract infection, site not specified: Secondary | ICD-10-CM | POA: Diagnosis not present

## 2016-09-12 DIAGNOSIS — Z006 Encounter for examination for normal comparison and control in clinical research program: Secondary | ICD-10-CM

## 2016-09-12 DIAGNOSIS — G609 Hereditary and idiopathic neuropathy, unspecified: Secondary | ICD-10-CM

## 2016-09-12 DIAGNOSIS — L658 Other specified nonscarring hair loss: Secondary | ICD-10-CM | POA: Diagnosis not present

## 2016-09-12 DIAGNOSIS — N899 Noninflammatory disorder of vagina, unspecified: Secondary | ICD-10-CM | POA: Diagnosis not present

## 2016-09-12 DIAGNOSIS — M818 Other osteoporosis without current pathological fracture: Secondary | ICD-10-CM

## 2016-09-12 LAB — CBC WITH DIFFERENTIAL/PLATELET
BASO%: 0.8 % (ref 0.0–2.0)
BASOS ABS: 0 10*3/uL (ref 0.0–0.1)
EOS%: 2.3 % (ref 0.0–7.0)
Eosinophils Absolute: 0.1 10*3/uL (ref 0.0–0.5)
HEMATOCRIT: 40.2 % (ref 34.8–46.6)
HEMOGLOBIN: 13.8 g/dL (ref 11.6–15.9)
LYMPH#: 1.4 10*3/uL (ref 0.9–3.3)
LYMPH%: 23.6 % (ref 14.0–49.7)
MCH: 33 pg (ref 25.1–34.0)
MCHC: 34.3 g/dL (ref 31.5–36.0)
MCV: 96.3 fL (ref 79.5–101.0)
MONO#: 0.4 10*3/uL (ref 0.1–0.9)
MONO%: 6.2 % (ref 0.0–14.0)
NEUT#: 4 10*3/uL (ref 1.5–6.5)
NEUT%: 67.1 % (ref 38.4–76.8)
PLATELETS: 257 10*3/uL (ref 145–400)
RBC: 4.18 10*6/uL (ref 3.70–5.45)
RDW: 12.8 % (ref 11.2–14.5)
WBC: 6 10*3/uL (ref 3.9–10.3)

## 2016-09-12 LAB — COMPREHENSIVE METABOLIC PANEL
ALK PHOS: 116 U/L (ref 40–150)
ALT: 24 U/L (ref 0–55)
ANION GAP: 7 meq/L (ref 3–11)
AST: 19 U/L (ref 5–34)
Albumin: 3.7 g/dL (ref 3.5–5.0)
BUN: 10 mg/dL (ref 7.0–26.0)
CALCIUM: 9.2 mg/dL (ref 8.4–10.4)
CO2: 26 mEq/L (ref 22–29)
CREATININE: 0.9 mg/dL (ref 0.6–1.1)
Chloride: 105 mEq/L (ref 98–109)
EGFR: 67 mL/min/{1.73_m2} — ABNORMAL LOW (ref >90)
Glucose: 99 mg/dl (ref 70–140)
Potassium: 3.7 mEq/L (ref 3.5–5.1)
Sodium: 138 mEq/L (ref 136–145)
Total Bilirubin: 0.37 mg/dL (ref 0.20–1.20)
Total Protein: 6.6 g/dL (ref 6.4–8.3)

## 2016-09-12 NOTE — Progress Notes (Signed)
09/12/2016 Patient in to clinic today unaccompanied for Cycle 3 visit. Upon arrival to clinic, questionnaires for PROs and adherence were given to the patient for completion. Patient returned her completed Cycle 2 drug diary for anti-hormone therapy, indicating no missed doses. Patient was given drug diaries for anti-hormone therapy cycles 3 through 5. She still has significant alopecia but has noticed some hair growth. She has mild fatigue and rare tearing of her eyes, with occasional dry eye as well. Cindy S. Brigitte Pulse BSN, RN, [place] 09/12/2016 3:55 PM  Adverse Event Log Study/Protocol: AFT-05 PALLAS Cycle 2: 08/15/16 - 09/12/16  (cycle end date = 09/11/16) Event Grade Onset Date Resolved Date Attribution to anastrozole Treatment/ Comments  Vaginal dryness Grade 1 08/01/16 ongoing Yes   Urinary incontinence- stress  Grade 1  08/15/16  ongoing No   Urinary tract infection Grade 2 09/07/16  ongoing No Antibiotic per GYN  Cindy S. Brigitte Pulse BSN, RN, CCRP 10/12/2016 10:50 AM

## 2016-09-12 NOTE — Progress Notes (Signed)
[facility]  [phone] [phone]     ID: PORSCHEA BORYS DOB: June 29, 1945  MR#: [id number]  GYF#:[id number]  Patient Care Team: Seward Carol, MD as PCP - General (Internal Medicine) Miia Blanks, Virgie Dad, MD as Consulting Physician (Oncology) Fanny Skates, MD as Consulting Physician (General Surgery) Luberta Robertson, MD (Radiology) Hennie Duos, MD as Consulting Physician (Rheumatology) Domingo Pulse, MD (Urology) Druscilla Brownie, MD as Consulting Physician (Dermatology) Luberta Mutter, MD as Consulting Physician (Ophthalmology) Magnus Sinning, MD as Consulting Physician (Physical Medicine and Rehabilitation) Benson Norway, RN as Registered Nurse (Oncology) Megan Salon, MD as Consulting Physician (Gynecology) OTHER MD:  CHIEF COMPLAINT: Locally advanced breast cancer  CURRENT TREATMENT:  Anastrozole  INTERVAL HISTORY: Josslyn returns today for follow-up of her estrogen receptor positive breast cancer, accompanied by her study nurse Jenny Reichmann. Carime continues on anastrozole. She generally tolerates it well. Hot flashes and vaginal dryness are not a major issue. She never developed the arthralgias or myalgias that many patients can experience on this medication. She obtains it at a good price.  REVIEW OF SYSTEMS: Marolyn had been feeling poorly and having some dysuria. She discussed this with Dr. Sabra Heck at her visit 09/07/2016 and a urine culture was positive for Escherichia coli, multiply resistant, but sensitive to nitrofurantoin (it was resistant to penicillin derivatives and cephalosporins as well as sulfa.) She was started on nitrofurantoin, for which she has a questionable history of allergies, but she is tolerating it well so far and she is already feeling somewhat better. Aside from this issue, she continues to have significant alopecia. She has multiple aches and pains related to her fibromyalgia and arthritis. These are not more  persistent or intense than prior. A detailed review of systems was otherwise stable  BREAST CANCER HISTORY: From the original intake note:  [place] had routine mammographic screening in 08/18/2015 at the [facility], showing a right breast mass and possible axillary adenopathy. On 08/23/2015 she underwent bilateral diagnostic mammography with tomography and bilateral breast ultrasonography. The breast density was category B in the right breast there was a spiculated mass in the upper-outer quadrant measuring 1.2 cm and associated with pleomorphic calcifications. There were calcifications extending anteriorly and posteriorly from the mass worrisome for ductal carcinoma in situ. The total area in question is about 3 cm. There were also multiple enlarged right axillary lymph nodes. The mass was palpated as an area of fullness at the 12:00 position of the breast 3 cm from the nipple. There were palpable enlarged right axillary lymph nodes in the right axilla. Ultrasonography of the right breast confirmed an irregular hypoechoic mass in the area in question measuring 1.6 cm. There were multiple abnormal appearing enlarged right axillary lymph nodes, the largest measuring 2.3 cm.  In the left breast, mammography showed an area of architectural distortion associated with the left nipple. This was not palpable. Ultrasonography of the left breast found no correlate. This area of the left breast was biopsied under tomography 08/24/2015. This showed (SAA [id number]) a complex sclerosing lesion and fibroadenoma.  On 08/23/2015 the patient underwent biopsy of the right breast mass in 1 of the abnormal right axillary lymph nodes. 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 Both biopsies were HER-2 nonamplified, the breast being ratio 1.27 and number per cell 1.90,  and the lymph node 1 ratio  1.24 and number per cell 3.0.  On 08/26/2015 the patient underwent bilateral breast MRI. In the left breast there was no suspicious enhancement. The left axilla was benign. In the right breast there was a mass described as in the upper inner quadrant measuring 2.3 cm. Less than a centimeter away from this mass there was a satellite nodule measuring 0.9 cm. In the right axilla there were multiple enlarged right axillary lymph nodes, including a confluent region measuring up to 4 cm. There were also enlarged level II lymph nodes present measuring up to 2.1 cm.  The patient's subsequent history is as detailed below  PAST MEDICAL HISTORY: Past Medical History:  Diagnosis Date  . Anxiety   . Arthritis 2008  . Asthma 1952   age 71  . Cancer [facility])    breast cancer  . Chronic vaginitis 1998  . Fibromyalgia 1982  . GERD (gastroesophageal reflux disease)    since at least 2011  . Hemorrhoids 2009  . History of radiation therapy 01/18/16-03/05/16   right breast/nodal areas 45 Gy, right breast boost 16 Gy  . Hot flashes 07/13/2016  . Hypoglycemia 2003  . Hypothyroidism 1964   age 71  . Interstitial cystitis 1998  . Irritable bowel syndrome 06/14/2009   Qualifier: Diagnosis of  By: Patsy Baltimore RN, Langley Gauss    . Menopausal symptoms   . Osteoporosis 12/24/2006  . Pneumonia 1979   varicella  . PONV (postoperative nausea and vomiting) 1979    PAST SURGICAL HISTORY: Past Surgical History:  Procedure Laterality Date  . BLADDER HYPEREXTENSION     X4, prior to 01/2006  . BLADDER SUSPENSION    . BREAST LUMPECTOMY WITH NEEDLE LOCALIZATION AND AXILLARY LYMPH NODE DISSECTION Right 12/05/2015   Procedure: RIGHT BREAST NEEDLE LOCALIZED (WIRES X 2) LUMPECTOMY WITH AXILLARY LYMPH NODE DISSECTION;  Surgeon: Fanny Skates, MD;  Location: [place];  Service: General;  Laterality: Right;  . BREAST LUMPECTOMY WITH RADIOACTIVE SEED LOCALIZATION Left 12/05/2015   Procedure: RADIOACTIVE SEED  GUIDED LEFT BREAST LUMPECTOMY;  Surgeon: Fanny Skates, MD;  Location: [place];  Service: General;  Laterality: Left;  . BREAST SURGERY    . BUNIONECTOMY     prior to 01/2006  . CESAREAN SECTION  1979  . CHOLECYSTECTOMY  02/08/2006   DR. INGRAM  . COLONOSCOPY    . DENTAL SURGERY  2009-2016   13 dental implants  . LEG SURGERY  07/06/2004   Broken tibia  . MOUTH SURGERY  2016   multiple  . NASAL SINUS SURGERY     X4; prior to 01/2006  . PORT-A-CATH REMOVAL Right 12/05/2015   Procedure: REMOVAL PORT-A-CATH;  Surgeon: Fanny Skates, MD;  Location: [place];  Service: General;  Laterality: Right;  . PORTACATH PLACEMENT Left 09/08/2015   Procedure: INSERTION OF PORT-A-CATH ;  Surgeon: Fanny Skates, MD;  Location[facility];  Service: General;  Laterality: Left;    FAMILY HISTORY Family History  Problem Relation Age of Onset  . Hypertension Mother   . Heart disease Mother        d. 75  . Parkinsonism Father        d. 78  . Heart disease Sister   . Breast cancer Sister 73       s/p mastectomy; reportedly negative BRCA1/2 testing approx 7 years ago  . Melanoma Sister        d. 69s; hx of incresed sun exposure  . Cancer Sister        dx. sarcoma of  her face in her 49s; treated w/ MOHS surgery  . Parkinsonism Paternal Aunt        d. 90s  . Parkinsonism Paternal Uncle        d. late 28s  . Kidney failure Maternal Grandmother 72  . Heart attack Maternal Grandfather        d. 43s  . Heart disease Paternal Grandmother        d. 63  . Breast cancer Other 44       maternal great aunt (MGF's sister)  . Kidney cancer Paternal Uncle 75       d. 27s; unsure about smoking status  The patient's father died at age 81 from complications of Parkinson's disease. The patient's mother died age 72 with heart disease. Lea had no brothers. She had 2 sisters. One sister was diagnosed with breast cancer at age 58. She was tested for the BRCA gene and was negative. The same sister also had melanoma  diagnosed in her 15s. The patient has one maternal great aunt diagnosed with breast cancer around age 54. There is no history of ovarian cancer in the family  GYNECOLOGIC HISTORY:  Patient's last menstrual period was 04/30/1996 (approximate). Menarche age 63, first live birth age 26. The patient is GX P2. She stopped having periods in her early 75s and took hormone replacement until her breast cancer diagnosis, April 2017.  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 He has been in remission for more than 10 years. Their daughter Nira Conn lives in [place] were she is Production designer, theatre/television/film of the [employer]. Daughter Belenda Cruise is an infectious disease M.D. working and teaching at [employer] in [place]. The patient has one granddaughter, 46 months old as of May 2017. The family attends [employer]: Not in place   HEALTH MAINTENANCE: Social History  Substance Use Topics  . Smoking status: Former Smoker    Packs/day: 0.25    Years: 15.00    Types: Cigarettes  . Smokeless tobacco: Never Used     Comment: social smoker/"on and off"  . Alcohol use 0.0 oz/week     Comment: social     Colonoscopy: 2016/ [facility]  PAP: 2015  Bone density:   Lipid panel:  Allergies  Allergen Reactions  . Nitrofurantoin Other (See Comments)    Unknown reaction  . Penicillins Other (See Comments)    Has patient had a PCN reaction causing immediate rash, facial/tongue/throat swelling, SOB or lightheadedness with hypotension: No Has patient had a PCN reaction causing severe rash involving mucus membranes or skin necrosis: No Has patient had a PCN reaction that required hospitalization No Has patient had a PCN reaction occurring within the last 10 years: No If all of the above answers are  "NO", then may proceed with Cephalosporin use.  . Chlorhexidine Other (See Comments)    [place] IS OK. The CLOTH WIPE causes reaction. Causes itching all over the area  . Other Rash    Adhesive tape    Current Outpatient Prescriptions  Medication Sig Dispense Refill  . albuterol (PROVENTIL HFA;VENTOLIN HFA) 108 (90 Base) MCG/ACT inhaler Inhale 1-2 puffs into the lungs every 6 (six) hours as needed for wheezing or shortness of breath. Started in 1998    . anastrozole (ARIMIDEX) 1 MG tablet  Take 1 tablet (1 mg total) by mouth daily. 90 tablet 4  . buPROPion (WELLBUTRIN XL) 300 MG 24 hr tablet Take 300 mg by mouth daily. Started in 2014    . Calcium Carbonate-Vitamin D (CALCIUM + D PO) Take 1 tablet by mouth at bedtime. Started approximately 1998    . carisoprodol (SOMA) 350 MG tablet Take 350 mg by mouth 2 (two) times daily as needed for muscle spasms. Started in 2008.  2  . clotrimazole-betamethasone (LOTRISONE) cream Apply 1 application topically 2 (two) times daily. 30 g 2  . cycloSPORINE (RESTASIS) 0.05 % ophthalmic emulsion Place 1 drop into both eyes 2 (two) times daily. 5.5 mL 3  . diazepam (VALIUM) 5 MG tablet Take 5 mg by mouth every 6 (six) hours as needed for muscle spasms (interstitial cystitis). Started approximately 2008.    . fluconazole (DIFLUCAN) 100 MG tablet Take 1 tablet (100 mg total) by mouth daily. (Patient not taking: Reported on 09/07/2016) 10 tablet 0  . HYDROcodone-acetaminophen (NORCO) 10-325 MG tablet Take 1 tablet by mouth every 4 (four) hours as needed for moderate pain or severe pain (as needed for fibromyalgia and IC).     . hydrOXYzine (ATARAX/VISTARIL) 25 MG tablet Take 1 tablet (25 mg total) by mouth 3 (three) times daily as needed for anxiety or itching. 30 tablet 0  . levothyroxine (SYNTHROID) 137 MCG tablet Take 137 mcg by mouth daily.     Marland Kitchen LORazepam (ATIVAN) 2 MG tablet Take 1 tablet by mouth daily as needed.  5  . Multiple Vitamin (MULTIVITAMIN) capsule Take  1 capsule by mouth daily. Taking since at least 1988.    . nitrofurantoin, macrocrystal-monohydrate, (MACROBID) 100 MG capsule Take 1 capsule (100 mg total) by mouth 2 (two) times daily. 14 capsule 0  . ondansetron (ZOFRAN) 4 MG tablet Take 1 tablet by mouth daily as needed.  3  . pantoprazole (PROTONIX) 40 MG tablet Take 40 mg by mouth daily. Started in 2012.    Vladimir Faster Glycol-Propyl Glycol (SYSTANE OP) Place 1 drop into both eyes as needed (for dry eyes). Has taken since at least 2015.    . prochlorperazine (COMPAZINE) 10 MG tablet Take 1 tablet (10 mg total) by mouth every 6 (six) hours as needed (Nausea or vomiting). 30 tablet 1  . sulfamethoxazole-trimethoprim (BACTRIM DS,SEPTRA DS) 800-160 MG tablet Take 1 tablet by mouth 2 (two) times daily. 14 tablet 0  . terconazole (TERAZOL 7) 0.4 % vaginal cream Place 1 applicator vaginally at bedtime. 45 g 0  . triamterene-hydrochlorothiazide (DYAZIDE) 37.5-25 MG capsule Take 1 tablet by mouth daily. Has taken since at least 2012.    . Vitamin D, Ergocalciferol, (DRISDOL) 50000 units CAPS capsule Take 1 capsule (50,000 Units total) by mouth every 7 (seven) days. 12 capsule 3  . zolpidem (AMBIEN) 10 MG tablet Take 10 mg by mouth at bedtime as needed for sleep (since 2009).      No current facility-administered medications for this visit.     OBJECTIVE: Middle-aged white womanWho appears stated age 8:   09/12/16 1508  BP: 132/67  Pulse: (!) 57  Resp: 19  Temp: 98 F (36.7 C)     Body mass index is 26.98 kg/m.    ECOG FS:1 - Symptomatic but completely ambulatory  Sclerae unicteric, pupils round and equal Oropharynx clear and moist No cervical or supraclavicular adenopathy Lungs no rales or rhonchi Heart regular rate and rhythm Abd soft, nontender, positive bowel sounds MSK no focal spinal  tenderness, no upper extremity lymphedema Neuro: nonfocal, well oriented, appropriate affect Breasts: The right breast has undergone lumpectomy  and radiation. There is no evidence of local recurrence. The erythema previously noted in the inframammary fold has resolved. The erythema in the superior aspect of the breast itself is still present but much less apparent. There is thickening of the skin as expected after radiation. The left breast is unremarkable. Both axillae are benign.  Right breast 08/15/2016     LAB RESULTS:  CMP     Component Value Date/Time   NA 138 09/12/2016 1440   K 3.7 09/12/2016 1440   CL 101 12/15/2015 2044   CO2 26 09/12/2016 1440   GLUCOSE 99 09/12/2016 1440   BUN 10.0 09/12/2016 1440   CREATININE 0.9 09/12/2016 1440   CALCIUM 9.2 09/12/2016 1440   PROT 6.6 09/12/2016 1440   ALBUMIN 3.7 09/12/2016 1440   AST 19 09/12/2016 1440   ALT 24 09/12/2016 1440   ALKPHOS 116 09/12/2016 1440   BILITOT 0.37 09/12/2016 1440   GFRNONAA >60 12/15/2015 2044   GFRAA >60 12/15/2015 2044    INo results found for: SPEP, UPEP  Lab Results  Component Value Date   WBC 6.0 09/12/2016   NEUTROABS 4.0 09/12/2016   HGB 13.8 09/12/2016   HCT 40.2 09/12/2016   MCV 96.3 09/12/2016   PLT 257 09/12/2016      Chemistry      Component Value Date/Time   NA 138 09/12/2016 1440   K 3.7 09/12/2016 1440   CL 101 12/15/2015 2044   CO2 26 09/12/2016 1440   BUN 10.0 09/12/2016 1440   CREATININE 0.9 09/12/2016 1440      Component Value Date/Time   CALCIUM 9.2 09/12/2016 1440   ALKPHOS 116 09/12/2016 1440   AST 19 09/12/2016 1440   ALT 24 09/12/2016 1440   BILITOT 0.37 09/12/2016 1440       No results found for: LABCA2  No components found for: LABCA125  No results for input(s): INR in the last 168 hours.  Urinalysis    Component Value Date/Time   COLORURINE YELLOW 12/15/2015 2208   APPEARANCEUR CLEAR 12/15/2015 2208   LABSPEC 1.014 12/15/2015 2208   PHURINE 6.5 12/15/2015 2208   GLUCOSEU NEGATIVE 12/15/2015 2208   HGBUR NEGATIVE 12/15/2015 2208   BILIRUBINUR N 09/07/2016 1432   KETONESUR NEGATIVE  12/15/2015 2208   PROTEINUR TRACE 09/07/2016 1432   PROTEINUR NEGATIVE 12/15/2015 2208   NITRITE N 09/07/2016 1432   NITRITE NEGATIVE 12/15/2015 2208   LEUKOCYTESUR Moderate (2+) (A) 09/07/2016 1432      ELIGIBLE FOR AVAILABLE RESEARCH PROTOCOL: PALLAS, neuropathy study  STUDIES: She will be due for repeat mammography in July.  ASSESSMENT: 71 y.o. [place] woman status post right breast upper outer quadrant and right axillary lymph node biopsy 08/23/2015, both positive for a clinical T1 N2, stage IIIA invasive ductal carcinoma, grade 2, strongly estrogen receptor positive, 5% progesterone receptor positive, with an MIB-120-30% and no HER-2 amplification  (a) biopsy of a 0.9 cm Right breast satellite nodule 09/13/2015 showed invasive ductal carcinoma, grade 2, E-cadherin strongly positive, with a prognostic panel identical to the larger right upper quadrant mass  (1) left breast biopsy 08/24/2015 shows a complex sclerosing lesion, excised 12/05/2015  (2) neoadjuvant chemotherapy consisting of cyclophosphamide and docetaxel every 21 days 4, with OnPro support, started 09/12/2015, completed 11/14/2015  (3) status post right lumpectomy and sentinel lymph node dissection 12/05/2015 for a residual pT2 pN3, stage IIIC invasive ductal  carcinoma, grade 2, with a close anterior margin (skin)  (4) adjuvant radiation 01/18/16 - 11/6/17with capecitabine chemo-sensitization   1) Right breast/nodal areas: 45 Gy in 25 fractions                         2) Right breast boost: 16 Gy in 8 fractions  (5) capecitabine started 04/13/2016, discontinued January 2018 with mild/moderate side effects developing  (6) anastrozole started 06/01/2016  (7) genetics testing ([place]kenazi descent) 02/07/2016 through the [place] offered by [facility] Hope Pigeon, MD) i found no deleterious mutations in  ATM, BARD1, BRCA1, BRCA2, BRIP1, CDH1, CHEK2, FANCC, MLH1, MSH2, MSH6, NBN, PALB2, PMS2,  PTEN, RAD51C, RAD51D, TP53, and XRCC2.  This panel also includes deletion/duplication analysis (without sequencing) for one gene, EPCAM.  (8) consented to PALLAS trial   (a) randomized to anastrozole alone  PLAN: Patric will be a year out from definitive surgery for her breast cancer come August. There is no evidence of disease recurrence. This is favorable.  She is tolerating the anastrozole generally well. She is having significant vaginal dryness issues. She is trying vitamin E suppositories and hopefully that will help. If not she knows that we have a program("intimacy and pelvic health") that she might be interested in participating.  So far she is tolerating the nitrofurantoin well. I thought it might not be a bad idea for her to take Diflucan the next 3 days in addition since she is very likely to get a yeast infection otherwise. She has had available.  She was supposed to have an MRI of the orbits ordered by Dr. Ellie Lunch, but that was never done. For now that is on hold.  Otherwise she will see me again in August and just before that visit she will have a repeat CT scan of the chest.  She knows to call for any problems that may develop before her next appointment    Chauncey Cruel, MD  09/12/2016

## 2016-09-13 ENCOUNTER — Encounter: Payer: Self-pay | Admitting: Obstetrics & Gynecology

## 2016-09-13 ENCOUNTER — Ambulatory Visit (INDEPENDENT_AMBULATORY_CARE_PROVIDER_SITE_OTHER): Payer: Medicare Other | Admitting: Obstetrics & Gynecology

## 2016-09-13 VITALS — BP 120/86 | HR 72 | Temp 98.0°F | Resp 14 | Ht 62.75 in | Wt 150.0 lb

## 2016-09-13 DIAGNOSIS — R300 Dysuria: Secondary | ICD-10-CM | POA: Diagnosis not present

## 2016-09-13 DIAGNOSIS — N952 Postmenopausal atrophic vaginitis: Secondary | ICD-10-CM

## 2016-09-13 DIAGNOSIS — Z1624 Resistance to multiple antibiotics: Secondary | ICD-10-CM | POA: Diagnosis not present

## 2016-09-13 DIAGNOSIS — N3090 Cystitis, unspecified without hematuria: Secondary | ICD-10-CM | POA: Diagnosis not present

## 2016-09-13 LAB — POCT URINALYSIS DIPSTICK
Bilirubin, UA: NEGATIVE
Blood, UA: NEGATIVE
Glucose, UA: NEGATIVE
Ketones, UA: NEGATIVE
LEUKOCYTES UA: NEGATIVE
NITRITE UA: NEGATIVE
PROTEIN UA: NEGATIVE
pH, UA: 8 (ref 5.0–8.0)

## 2016-09-13 MED ORDER — NITROFURANTOIN MONOHYD MACRO 100 MG PO CAPS: 100.0000 mg | ORAL_CAPSULE | Freq: Two times a day (BID) | ORAL | 0 refills | Status: DC

## 2016-09-13 MED ORDER — URIBEL 118 MG PO CAPS: 1.0000 | ORAL_CAPSULE | Freq: Four times a day (QID) | ORAL | 2 refills | Status: DC

## 2016-09-13 NOTE — Progress Notes (Addendum)
GYNECOLOGY  VISIT   HPI: 71 y.o. G43P2002 Married Caucasian female here for follow up after being diagnosed with a multiple drug resistant UTI.  Reports she had blood in her urine on Monday but that resolved after her antibiotics were changed.  She is taking macrobid 152m bid.  Pt is aware repeat urine culture will be necessary due to her multiple drug resistant cystitis.  She does feel some of her symptoms are improved.  She denies fever or back pain.  Denies pelvic pain.  She is still having urinary urgency and some pressure.  She denies vaginal bleeding.  GYNECOLOGIC HISTORY: Patient's last menstrual period was 04/30/1996 (approximate). Contraception: PMP Menopausal hormone therapy: none  Patient Active Problem List   Diagnosis Date Noted  . Genetic testing 03/12/2016  . Family history of breast cancer in female 02/08/2016  . Rash and nonspecific skin eruption 10/03/2015  . Malignant neoplasm of upper-outer quadrant of right breast in female, estrogen receptor positive [place] 09/01/2015  . 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 Menopausal symptoms   . Chronic vaginitis   . Fibromyalgia   . Interstitial cystitis   . UNSPECIFIED HYPOTHYROIDISM 06/14/2009  . DEPRESSIVE DISORDER NOT ELSEWHERE CLASSIFIED 06/14/2009  . Hereditary and idiopathic peripheral neuropathy 06/14/2009  . UNSPEC HEMORRHOIDS WITHOUT MENTION COMPLICATION 020/25/4270 . ALLERGIC RHINITIS CAUSE UNSPECIFIED 06/14/2009  . ESOPHAGEAL REFLUX 06/14/2009  . ACUTE GASTRITIS WITHOUT MENTION OF HEMORRHAGE 06/14/2009  . IRRITABLE BOWEL SYNDROME 06/14/2009  . INSOMNIA UNSPECIFIED 06/14/2009  . INTERSTITIAL CYSTITIS 08/27/2007  . VULVOVAGINITIS, CHRONIC 08/27/2007  . Fibromyalgia syndrome 08/27/2007  . Osteoporosis 08/27/2007    Past Medical History:  Diagnosis Date  . Anxiety   . Arthritis 2008  . Asthma 1952   age 71 . Cancer ([facility]    breast cancer  . Chronic vaginitis 1998  . Fibromyalgia 1982  . GERD  (gastroesophageal reflux disease)    since at least 2011  . Hemorrhoids 2009  . History of radiation therapy 01/18/16-03/05/16   right breast/nodal areas 45 Gy, right breast boost 16 Gy  . Hot flashes 07/13/2016  . Hypoglycemia 2003  . Hypothyroidism 1964   age 71 . Interstitial cystitis 1998  . Irritable bowel syndrome 06/14/2009   Qualifier: Diagnosis of  By: EPatsy BaltimoreRN, DLangley Gauss   . Menopausal symptoms   . Osteoporosis 12/24/2006  . Pneumonia 1979   varicella  . PONV (postoperative nausea and vomiting) 1979    Past Surgical History:  Procedure Laterality Date  . BLADDER HYPEREXTENSION     X4, prior to 01/2006  . BLADDER SUSPENSION    . BREAST LUMPECTOMY WITH NEEDLE LOCALIZATION AND AXILLARY LYMPH NODE DISSECTION Right 12/05/2015   Procedure: RIGHT BREAST NEEDLE LOCALIZED (WIRES X 2) LUMPECTOMY WITH AXILLARY LYMPH NODE DISSECTION;  Surgeon: HFanny Skates MD;  [place]  Service: General;  Laterality: Right;  . BREAST LUMPECTOMY WITH RADIOACTIVE SEED LOCALIZATION Left 12/05/2015   Procedure: RADIOACTIVE SEED GUIDED LEFT BREAST LUMPECTOMY;  Surgeon: HFanny Skates MD;  [place]  Service: General;  Laterality: Left;  . BREAST SURGERY    . BUNIONECTOMY     prior to 01/2006  . CESAREAN SECTION  1979  . CHOLECYSTECTOMY  02/08/2006   DR. INGRAM  . COLONOSCOPY    . DENTAL SURGERY  2009-2016   13 dental implants  . LEG SURGERY  07/06/2004   Broken tibia  . MOUTH SURGERY  2016   multiple  . NASAL SINUS SURGERY  X4; prior to 01/2006  . PORT-A-CATH REMOVAL Right 12/05/2015   Procedure: REMOVAL PORT-A-CATH;  Surgeon: Fanny Skates, MD;  Location: [place];  Service: General;  Laterality: Right;  . PORTACATH PLACEMENT Left 09/08/2015   Procedure: INSERTION OF PORT-A-CATH ;  Surgeon: Fanny Skates, MD;  Location: [place];  Service: General;  Laterality: Left;    MEDS:  Reviewed in EPIC and UTD  ALLERGIES: Penicillins; Chlorhexidine; and Other  Family History  Problem  Relation Age of Onset  . Hypertension Mother   . Heart disease Mother        d. 86  . Parkinsonism Father        d. 50  . Heart disease Sister   . Breast cancer Sister 70       s/p mastectomy; reportedly negative BRCA1/2 testing approx 7 years ago  . Melanoma Sister        d. 2s; hx of incresed sun exposure  . Cancer Sister        dx. sarcoma of her face in her 29s; treated w/ MOHS surgery  . Parkinsonism Paternal Aunt        d. 90s  . Parkinsonism Paternal Uncle        d. late 5s  . Kidney failure Maternal Grandmother 67  . Heart attack Maternal Grandfather        d. 65s  . Heart disease Paternal Grandmother        d. 78  . Breast cancer Other 56       maternal great aunt (MGF's sister)  . Kidney cancer Paternal Uncle 15       d. 32s; unsure about smoking status    SH:  Married, non smoker  Review of Systems  Constitutional: Positive for malaise/fatigue. Negative for chills and fever.  Respiratory: Negative.   Genitourinary: Positive for frequency and urgency. Negative for hematuria.    PHYSICAL EXAMINATION:    BP 120/86 (BP Location: Left Arm, Patient Position: Sitting, Cuff Size: Normal)   Pulse 72   Temp 98 F (36.7 C) (Oral)   Resp 14   Ht 5' 2.75" (1.594 m)   Wt 150 lb (68 kg)   LMP 04/30/1996 (Approximate)   BMI 26.78 kg/m     Physical Exam  Constitutional: She is oriented to person, place, and time. She appears well-developed and well-nourished.  Cardiovascular: Normal rate and regular rhythm.   Respiratory: Effort normal and breath sounds normal.  No CVA tenderness.  GI: Soft. Bowel sounds are normal. She exhibits no distension and no mass. There is no tenderness. There is no rebound and no guarding.  Genitourinary: There is no rash, tenderness, lesion or injury on the right labia. There is no rash, tenderness, lesion or injury on the left labia.  Genitourinary Comments: Significant vaginal atrophic changes.  Neurological: She is alert and oriented  to person, place, and time.  Skin: Skin is warm and dry.  Psychiatric: She has a normal mood and affect.   Chaperone was present for exam.  Assessment: Cystitis, multiple drug resistance  Vaginal atrophic changes  Plan: Will plan repeat urine culture on Monday if symptoms continue to improve.  May need RF for macrobid for longer.   Rx for Uribel to pharmacy to use prn dysuria, urinary urgency symptoms    Will plan to start Vit E suppositories next week if urine culture is negative.  Rx sent to [place].  ~25 minutes spent with patient due to questions, concerns about multiple  drug resistance culture, options treatment if macrobid does not treat infection.  >50% of time was in face to face discussion.

## 2016-09-17 ENCOUNTER — Ambulatory Visit (INDEPENDENT_AMBULATORY_CARE_PROVIDER_SITE_OTHER): Payer: Medicare Other

## 2016-09-17 VITALS — BP 108/60 | HR 84 | Resp 16 | Wt 150.0 lb

## 2016-09-17 DIAGNOSIS — R8290 Unspecified abnormal findings in urine: Secondary | ICD-10-CM

## 2016-09-17 NOTE — Progress Notes (Signed)
Patient in office for repeat urine culture. Patient states that she is feeling better. Will call the pharmacy for refill of Macrobid. Clean-catch urine has been done and sent to the lab for resulting.   Routing to provider for final review. Patient agreeable to disposition. Will close encounter.

## 2016-09-18 LAB — URINE CULTURE: Organism ID, Bacteria: NO GROWTH

## 2016-09-19 ENCOUNTER — Telehealth: Payer: Self-pay | Admitting: *Deleted

## 2016-09-19 NOTE — Telephone Encounter (Signed)
Left message to call [name] at [phone].  

## 2016-09-19 NOTE — Telephone Encounter (Signed)
Left message to call Fleurette Woolbright at [phone].  

## 2016-09-19 NOTE — Telephone Encounter (Signed)
-----   Message from Megan Salon, MD sent at 09/19/2016 12:19 AM EDT ----- Please let pt know her urine culture was negative.  Is she still having symptoms?  Hopefully, most of it is resolved as she has a multi-drug resistant UTI.  Thanks for the update.

## 2016-09-19 NOTE — Telephone Encounter (Signed)
This is all good.  I would like to repeat the culture in two weeks once she is completely off the antibiotic.  Please schedule.  Thanks.

## 2016-09-19 NOTE — Telephone Encounter (Signed)
Spoke with patient, advised of results and recommendations as seen below per Dr. Sabra Heck. Patient reports symptoms are improving, still has urinary frequency and discomfort with urinating. Patient states not as bad, has four days of macrobid left. Advised patient to complete Macrobid, return call to office if symptoms do not resolve. Advised patient would update Dr. Sabra Heck and return call with any additional recommendations, patient is agreeable.   Dr. Sabra Heck -any additional recommendations?

## 2016-09-19 NOTE — Telephone Encounter (Signed)
Spoke with patient, advised as seen below per Dr. Sabra Heck. Patient scheduled for repeat urine culture on 10/03/16 at 3pm. Patient would like to relay that she has been very impressed with office staff attentiveness, timeliness and efficiency -everyone from front office, clinical and providers. Patient thankful for return call and verbalizes understanding.  Routing to provider for final review. Patient is agreeable to disposition. Will close encounter.

## 2016-09-21 ENCOUNTER — Encounter: Payer: Medicare Other | Admitting: Adult Health

## 2016-10-01 ENCOUNTER — Encounter: Payer: Self-pay | Admitting: Obstetrics & Gynecology

## 2016-10-01 ENCOUNTER — Ambulatory Visit (INDEPENDENT_AMBULATORY_CARE_PROVIDER_SITE_OTHER): Payer: Medicare Other | Admitting: Obstetrics & Gynecology

## 2016-10-01 ENCOUNTER — Telehealth: Payer: Self-pay

## 2016-10-01 VITALS — BP 122/80 | HR 88 | Temp 98.2°F | Resp 16 | Ht 62.75 in | Wt 147.0 lb

## 2016-10-01 DIAGNOSIS — N3010 Interstitial cystitis (chronic) without hematuria: Secondary | ICD-10-CM | POA: Diagnosis not present

## 2016-10-01 DIAGNOSIS — R82998 Other abnormal findings in urine: Secondary | ICD-10-CM

## 2016-10-01 DIAGNOSIS — R8299 Other abnormal findings in urine: Secondary | ICD-10-CM | POA: Diagnosis not present

## 2016-10-01 DIAGNOSIS — N898 Other specified noninflammatory disorders of vagina: Secondary | ICD-10-CM | POA: Diagnosis not present

## 2016-10-01 DIAGNOSIS — M797 Fibromyalgia: Secondary | ICD-10-CM | POA: Diagnosis not present

## 2016-10-01 DIAGNOSIS — N761 Subacute and chronic vaginitis: Secondary | ICD-10-CM | POA: Diagnosis not present

## 2016-10-01 LAB — POCT URINALYSIS DIPSTICK
Bilirubin, UA: NEGATIVE
GLUCOSE UA: NEGATIVE
Ketones, UA: NEGATIVE
Nitrite, UA: NEGATIVE
Protein, UA: NEGATIVE
UROBILINOGEN UA: 0.2 U/dL
pH, UA: 7 (ref 5.0–8.0)

## 2016-10-01 MED ORDER — TERCONAZOLE 0.4 % VA CREA: 1.0000 | TOPICAL_CREAM | Freq: Every day | VAGINAL | 0 refills | Status: DC

## 2016-10-01 MED ORDER — NITROFURANTOIN MONOHYD MACRO 100 MG PO CAPS: 100.0000 mg | ORAL_CAPSULE | Freq: Two times a day (BID) | ORAL | 0 refills | Status: DC

## 2016-10-01 NOTE — Progress Notes (Signed)
GYNECOLOGY  VISIT   HPI: 71 y.o. G71P2002 Married Caucasian female here for complaint of vaginal discharge and urinary dysuria that started at the end of the week and worsened over the weekend.  She wasn't sure if she should call someone on call or not.  Feels like she is having systemic symptoms with increased fatigue and chills/low grade fever.  Also having some back pain.  Has hx of fibromyalgia and states (as she did at first visit with me) that using body aches and pains as a gauge for her is very hard as she has some sort of body symptoms almost all the time.  The dysuria is associated with urgency and urinary frequency.  Denies gross hematuria.  Pt very recently had a multiple drug resistant UTI.  Her symptoms were very similar and her dip u/a in the office only contained increased WBCs in urine.  That is the same today.  Pt is having some on-going vaginitis symptoms which are likely menopause and breast cancer treatment related.  Her symtoms are vaginal discharge with sensation of irritation.  She is on anastrozole.  She's had a standing prescription for yeast cream and metrogel to use as she felt was appropriate.  As she doesn't have a diagnosis of chronic yeast or BV, I'm not comfortable with providing a standing prescription at this time.  Discussed this with pt again today.  She voices understanding.  GYNECOLOGIC HISTORY: Patient's last menstrual period was 04/30/1996 (approximate). Contraception: PMP Menopausal hormone therapy: none  Patient Active Problem List   Diagnosis Date Noted  . Genetic testing 03/12/2016  . Family history of breast cancer in female 02/08/2016  . Rash and nonspecific skin eruption 10/03/2015  . Malignant neoplasm of upper-outer quadrant of right breast in female, estrogen receptor positive ([place]) 09/01/2015  . 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 Menopausal symptoms   . Chronic vaginitis   . Fibromyalgia   . Interstitial cystitis   .  UNSPECIFIED HYPOTHYROIDISM 06/14/2009  . DEPRESSIVE DISORDER NOT ELSEWHERE CLASSIFIED 06/14/2009  . Hereditary and idiopathic peripheral neuropathy 06/14/2009  . UNSPEC HEMORRHOIDS WITHOUT MENTION COMPLICATION 42/59/5638  . ALLERGIC RHINITIS CAUSE UNSPECIFIED 06/14/2009  . ESOPHAGEAL REFLUX 06/14/2009  . ACUTE GASTRITIS WITHOUT MENTION OF HEMORRHAGE 06/14/2009  . IRRITABLE BOWEL SYNDROME 06/14/2009  . INSOMNIA UNSPECIFIED 06/14/2009  . INTERSTITIAL CYSTITIS 08/27/2007  . Vaginitis and vulvovaginitis 08/27/2007  . Fibromyalgia syndrome 08/27/2007  . Osteoporosis 08/27/2007    Past Medical History:  Diagnosis Date  . Anxiety   . Arthritis 2008  . Asthma 1953   age 50  . Cancer [facility])    breast cancer  . Chronic vaginitis 1998  . Fibromyalgia 1982  . GERD (gastroesophageal reflux disease)    since at least 2011  . Hemorrhoids 2009  . History of radiation therapy 01/18/16-03/05/16   right breast/nodal areas 45 Gy, right breast boost 16 Gy  . Hypoglycemia 2003  . Hypothyroidism 1964   age 53  . Interstitial cystitis 1998  . Irritable bowel syndrome 06/14/2009   Qualifier: Diagnosis of  By: Patsy Baltimore RN, Langley Gauss    . Menopausal symptoms   . Osteoporosis 12/24/2006  . Pneumonia 1979   varicella  . PONV (postoperative nausea and vomiting) 1979    Past Surgical History:  Procedure Laterality Date  . BLADDER HYPEREXTENSION     X4, prior to 01/2006  . BLADDER SUSPENSION    . BREAST LUMPECTOMY WITH NEEDLE LOCALIZATION AND AXILLARY LYMPH NODE DISSECTION Right 12/05/2015   Procedure:  RIGHT BREAST NEEDLE LOCALIZED (WIRES X 2) LUMPECTOMY WITH AXILLARY LYMPH NODE DISSECTION;  Surgeon: Fanny Skates, MD;  Location: [place];  Service: General;  Laterality: Right;  . BREAST LUMPECTOMY WITH RADIOACTIVE SEED LOCALIZATION Left 12/05/2015   Procedure: RADIOACTIVE SEED GUIDED LEFT BREAST LUMPECTOMY;  Surgeon: Fanny Skates, MD;  Location: [place];  Service: General;  Laterality: Left;  . BREAST SURGERY     . BUNIONECTOMY     prior to 01/2006  . CESAREAN SECTION  1979  . CHOLECYSTECTOMY  02/08/2006   DR. INGRAM  . COLONOSCOPY    . DENTAL SURGERY  2009-2016   13 dental implants  . LEG SURGERY  07/06/2004   Broken tibia  . NASAL SINUS SURGERY     X4; prior to 01/2006  . PORT-A-CATH REMOVAL Right 12/05/2015   Procedure: REMOVAL PORT-A-CATH;  Surgeon: Fanny Skates, MD;  Location: [place];  Service: General;  Laterality: Right;  . PORTACATH PLACEMENT Left 09/08/2015   Procedure: INSERTION OF PORT-A-CATH ;  Surgeon: Fanny Skates, MD;  Location: [place];  Service: General;  Laterality: Left;    MEDS:  Albuterol, arimidex, wellbutrin XL, Soma, Restasis, Valium, Hydrocodone/tylenol, Hydroxyzine, Synthroid, Ativan, Uribel, Vit D suppositories, Compazine, Protonix, Zofran  ALLERGIES: Penicillins; Chlorhexidine; and Other  Family History  Problem Relation Age of Onset  . Hypertension Mother   . Heart disease Mother        d. 71  . Parkinsonism Father        d. 26  . Heart disease Sister   . Breast cancer Sister 95       s/p mastectomy; reportedly negative BRCA1/2 testing approx 7 years ago  . Melanoma Sister        d. 19s; hx of incresed sun exposure  . Cancer Sister        dx. sarcoma of her face in her 11s; treated w/ MOHS surgery  . Parkinsonism Paternal Aunt        d. 90s  . Parkinsonism Paternal Uncle        d. late 44s  . Kidney failure Maternal Grandmother 42  . Heart attack Maternal Grandfather        d. 42s  . Heart disease Paternal Grandmother        d. 81  . Breast cancer Other 63       maternal great aunt (MGF's sister)  . Kidney cancer Paternal Uncle 72       d. 76s; unsure about smoking status    SH:  Married, non smoker  Review of Systems  Constitutional: Positive for chills and fever.  Respiratory: Negative.   Cardiovascular: Negative.   Gastrointestinal: Negative for abdominal pain.  Genitourinary: Positive for dysuria, frequency and urgency. Negative for  hematuria.  Musculoskeletal: Positive for back pain and myalgias.  Neurological: Negative.     PHYSICAL EXAMINATION:    BP 122/80 (BP Location: Left Arm, Patient Position: Sitting, Cuff Size: Normal)   Pulse 88   Temp 98.2 F (36.8 C) (Oral)   Resp 16   Ht 5' 2.75" (1.594 m)   Wt 147 lb (66.7 kg)   LMP 04/30/1996 (Approximate)   BMI 26.25 kg/m     General appearance: alert, cooperative and appears stated age CV:  Regular rate and rhythm Lungs:  clear to auscultation, no wheezes, rales or rhonchi, symmetric air entry Abdomen: soft, non-tender; bowel sounds normal; no masses,  no organomegaly Flank:  No CVA tenderness  Pelvic: External genitalia:  no lesions  Urethra:  normal appearing urethra with no masses, tenderness or lesions              Bartholins and Skenes: normal                 Vagina: erythema, scant discharge present              Cervix: no lesions              Bimanual Exam:  Uterus:  normal size, contour, position, consistency, mobility, non-tender              Adnexa: no mass, fullness, tenderness              Anus:  no lesions  Chaperone was present for exam.  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  May need to see urologist again depending on urine culture results Terazol 7, one applicator nightly x 7 days

## 2016-10-01 NOTE — Telephone Encounter (Signed)
Spoke with patient at time of incoming call. Patient states that she completed Terazol 7 and full course of Macrobid as prescribed by Dr.Miller on 09/13/2016. States over the weekend she began to have urinary frequency, burning with urination, and green discharge. Woke up in the middle of the night last night and felt she had a fever. Did not take her temperature at that time. Took temperature this morning after taking 2 tylenol and it was 99 F. Is starting to have lower back pain. Denies recent exposure to anyone who has been sick that she knows of. Advised she will need to be seen in the office for further evaluation. Patient is agreeable. Appointment scheduled for today at 11:15 am with Dr.Miller. Patient is agreeable to date and time.  Routing to provider for final review. Patient agreeable to disposition. Will close encounter.

## 2016-10-03 ENCOUNTER — Ambulatory Visit: Payer: Self-pay

## 2016-10-03 LAB — URINE CULTURE

## 2016-10-03 LAB — VAGINITIS/VAGINOSIS, DNA PROBE
CANDIDA SPECIES: NEGATIVE
Gardnerella vaginalis: NEGATIVE
Trichomonas vaginosis: NEGATIVE

## 2016-10-05 ENCOUNTER — Telehealth: Payer: Self-pay

## 2016-10-05 MED ORDER — CIPROFLOXACIN HCL 500 MG PO TABS: 500.0000 mg | ORAL_TABLET | Freq: Two times a day (BID) | ORAL | 0 refills | Status: DC

## 2016-10-05 NOTE — Telephone Encounter (Signed)
-----   Message from Megan Salon, MD sent at 10/04/2016  4:10 PM EDT ----- Please let pt know her urine culture showed GBS.  Need to treat with ciprofloxin 500mg  bid x 3 days as she is allergic to PCN.  Repeat urine culture 1 week.  Ok to call in rx to pharmacy.  Thanks.

## 2016-10-05 NOTE — Telephone Encounter (Signed)
Yes, I am happy to refer her to an infectious disease specialist.  Would she allow me to talk to her daughter as well?  I think it would be good if [name] is comfortable with that plan.  Thanks.

## 2016-10-05 NOTE — Telephone Encounter (Signed)
Left message to call Kaitlyn at [phone]. 

## 2016-10-05 NOTE — Telephone Encounter (Signed)
Spoke with patient. Advised of message as seen below from [place]. Patient is agreeable and verbalizes understanding. Patient states that her daughter is an infectious disease MD and is advising her that she should have a consult with infectious disease. Patient is asking if Dr.Miller could make a referral to infectious disease. "i trust Dr.Miller. My daughter is just worried about my recurrent infections."

## 2016-10-05 NOTE — Telephone Encounter (Signed)
Spoke with patient. Results given. Rx for Cipro 500 mg bid x 3 days #6 0RF sent to pharmacy on file. 1 week recheck scheduled for 6/15/018 at 2:30 pm. Patient is agreeable to date and time. patietn is asking if she may use Vitamin E suppositories more frequently than twice per week for relief. Advised will review with Dr.Miller and return call. Patient is agreeable.

## 2016-10-05 NOTE — Telephone Encounter (Signed)
Yes, three times weekly is fine--every other day, basically.

## 2016-10-08 ENCOUNTER — Encounter: Payer: Medicare Other | Admitting: Adult Health

## 2016-10-08 NOTE — Telephone Encounter (Signed)
Spoke with patient. Advised of message as seen below from [place]. Patient gives verbal consent for Dr.Miller to speak with her daughter Lile Mccurley [phone]. Aware I will let Dr.Miller know and patient will be notified with plan moving forward.

## 2016-10-08 NOTE — Telephone Encounter (Signed)
Left message to call Caitlain Tweed at [phone]. 

## 2016-10-11 ENCOUNTER — Ambulatory Visit: Payer: Medicare Other | Admitting: Radiation Oncology

## 2016-10-12 ENCOUNTER — Ambulatory Visit (INDEPENDENT_AMBULATORY_CARE_PROVIDER_SITE_OTHER): Payer: Medicare Other | Admitting: Obstetrics & Gynecology

## 2016-10-12 ENCOUNTER — Ambulatory Visit: Payer: Medicare Other

## 2016-10-12 VITALS — BP 120/80 | HR 68 | Resp 16 | Ht 62.75 in | Wt 144.8 lb

## 2016-10-12 DIAGNOSIS — R8290 Unspecified abnormal findings in urine: Secondary | ICD-10-CM

## 2016-10-12 DIAGNOSIS — N3090 Cystitis, unspecified without hematuria: Secondary | ICD-10-CM

## 2016-10-12 DIAGNOSIS — N952 Postmenopausal atrophic vaginitis: Secondary | ICD-10-CM | POA: Diagnosis not present

## 2016-10-12 NOTE — Progress Notes (Signed)
Patient here for repeat urine culture.  Last urine culture showed positive for GBS on 10/01/16.  Patient took medication as prescribed. Patient feels better today.  Routed to provider and encounter closed.

## 2016-10-12 NOTE — Telephone Encounter (Signed)
Called pt's daughter and left detailed message per pt's instructions.

## 2016-10-13 LAB — URINE CULTURE

## 2016-10-14 ENCOUNTER — Encounter: Payer: Self-pay | Admitting: Obstetrics & Gynecology

## 2016-10-15 ENCOUNTER — Encounter: Payer: Self-pay | Admitting: Obstetrics & Gynecology

## 2016-10-15 ENCOUNTER — Ambulatory Visit
Admission: RE | Admit: 2016-10-15 | Discharge: 2016-10-15 | Disposition: A | Discharge status: Home / Self Care | Payer: Medicare Other | Source: Ambulatory Visit | Attending: Radiation Oncology | Admitting: Radiation Oncology

## 2016-10-15 VITALS — BP 129/97 | HR 72 | Temp 98.4°F | Resp 20 | Wt 145.0 lb

## 2016-10-15 DIAGNOSIS — Z79899 Other long term (current) drug therapy: Secondary | ICD-10-CM | POA: Insufficient documentation

## 2016-10-15 DIAGNOSIS — Z79891 Long term (current) use of opiate analgesic: Secondary | ICD-10-CM | POA: Diagnosis not present

## 2016-10-15 DIAGNOSIS — Z888 Allergy status to other drugs, medicaments and biological substances status: Secondary | ICD-10-CM | POA: Insufficient documentation

## 2016-10-15 DIAGNOSIS — C50411 Malignant neoplasm of upper-outer quadrant of right female breast: Secondary | ICD-10-CM | POA: Diagnosis not present

## 2016-10-15 DIAGNOSIS — Z170 Estrogen receptor positive status [ER+]: Secondary | ICD-10-CM | POA: Diagnosis not present

## 2016-10-15 DIAGNOSIS — Z923 Personal history of irradiation: Secondary | ICD-10-CM | POA: Diagnosis not present

## 2016-10-15 DIAGNOSIS — Z880 Allergy status to penicillin: Secondary | ICD-10-CM | POA: Diagnosis not present

## 2016-10-15 NOTE — Progress Notes (Signed)
GYNECOLOGY  VISIT   HPI: 71 y.o. G38P2002 Married Caucasian female who came today for repeat urine culture who also wants to discuss her recent care.  Pt added to the schedule.  Pt with hx of vaginal discharge and discomfort that I feel is due to arimidex use.  She was given prescriptions for Diflucan and Metrogel to use prn by prior provider.  Thus far, two vaginal swabs have been negative for yeast or BV.  She has started vaginal Vit E suppositories and she does think this is helping.  She has a multiple drug resistant UTI with her first visit.  This cleared but shortly after, she had a GBS positive urine culture.  Her daughter is ID specialist at [facility] and has questioned whether ID consultation is warranted.  I will refer her if she desires but advised pt, I do not feel this is needed right now due as these infections are being actively treated and her symptoms are improved so I feel this will resolve.  She is at increased risks to the atrophic changes but there is not anything that we can do for this due to her breast cancer (except for the Vit E or referring her for laser treatment.)  Pt is really comfortable with plan and does not desire referral at this time.  Patient Active Problem List   Diagnosis Date Noted  . Family history of breast cancer in female 02/08/2016  . Rash and nonspecific skin eruption 10/03/2015  . 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 Malignant neoplasm of upper-outer quadrant of right breast in female, estrogen receptor positive ([place]) 09/01/2015  . 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 Asthma 09/12/2011  . Menopausal symptoms   . Chronic vaginitis   . Fibromyalgia   . Interstitial cystitis   . UNSPECIFIED HYPOTHYROIDISM 06/14/2009  . DEPRESSIVE DISORDER NOT ELSEWHERE CLASSIFIED 06/14/2009  . Hereditary and idiopathic peripheral neuropathy 06/14/2009  . UNSPEC HEMORRHOIDS WITHOUT MENTION COMPLICATION 28/78/6767  . ALLERGIC RHINITIS CAUSE  UNSPECIFIED 06/14/2009  . ESOPHAGEAL REFLUX 06/14/2009  . Irritable bowel syndrome 06/14/2009  . INSOMNIA UNSPECIFIED 06/14/2009  . Osteoporosis 08/27/2007    Past Medical History:  Diagnosis Date  . Anxiety   . Arthritis 2008  . Asthma 1953   age 40  . Cancer [facility])    breast cancer  . Chronic vaginitis 1998  . Fibromyalgia 1982  . GERD (gastroesophageal reflux disease)    since at least 2011  . Hemorrhoids 2009  . History of radiation therapy 01/18/16-03/05/16   right breast/nodal areas 45 Gy, right breast boost 16 Gy  . Hypoglycemia 2003  . Hypothyroidism 1964   age 63  . Interstitial cystitis 1998  . Irritable bowel syndrome 06/14/2009   Qualifier: Diagnosis of  By: Patsy Baltimore RN, Langley Gauss    . Menopausal symptoms   . Osteoporosis 12/24/2006  . Pneumonia 1979   varicella  . PONV (postoperative nausea and vomiting) 1979    Past Surgical History:  Procedure Laterality Date  . BLADDER HYPEREXTENSION     X4, prior to 01/2006  . BLADDER SUSPENSION    . BREAST LUMPECTOMY WITH NEEDLE LOCALIZATION AND AXILLARY LYMPH NODE DISSECTION Right 12/05/2015   Procedure: RIGHT BREAST NEEDLE LOCALIZED (WIRES X 2) LUMPECTOMY WITH AXILLARY LYMPH NODE DISSECTION;  Surgeon: Fanny Skates, MD;  Location: [employer];  Service: General;  Laterality: Right;  . BREAST LUMPECTOMY WITH RADIOACTIVE SEED LOCALIZATION Left 12/05/2015   Procedure: RADIOACTIVE SEED GUIDED LEFT BREAST LUMPECTOMY;  Surgeon:  Fanny Skates, MD;  Location: [place];  Service: General;  Laterality: Left;  . BREAST SURGERY    . BUNIONECTOMY     prior to 01/2006  . CESAREAN SECTION  1979  . CHOLECYSTECTOMY  02/08/2006   DR. INGRAM  . COLONOSCOPY    . DENTAL SURGERY  2009-2016   13 dental implants  . LEG SURGERY  07/06/2004   Broken tibia  . NASAL SINUS SURGERY     X4; prior to 01/2006  . PORT-A-CATH REMOVAL Right 12/05/2015   Procedure: REMOVAL PORT-A-CATH;  Surgeon: Fanny Skates, MD;  Location: [place];  Service: General;   Laterality: Right;  . PORTACATH PLACEMENT Left 09/08/2015   Procedure: INSERTION OF PORT-A-CATH ;  Surgeon: Fanny Skates, MD;  Location: [place];  Service: General;  Laterality: Left;    MEDS:  Reviewed in EPIC and UTD  ALLERGIES: Penicillins; Chlorhexidine; and Other  Family History  Problem Relation Age of Onset  . Hypertension Mother   . Heart disease Mother        d. 61  . Parkinsonism Father        d. 42  . Heart disease Sister   . Breast cancer Sister 36       s/p mastectomy; reportedly negative BRCA1/2 testing approx 7 years ago  . Melanoma Sister        d. 59s; hx of incresed sun exposure  . Cancer Sister        dx. sarcoma of her face in her 23s; treated w/ MOHS surgery  . Parkinsonism Paternal Aunt        d. 90s  . Parkinsonism Paternal Uncle        d. late 74s  . Kidney failure Maternal Grandmother 69  . Heart attack Maternal Grandfather        d. 64s  . Heart disease Paternal Grandmother        d. 44  . Breast cancer Other 56       maternal great aunt (MGF's sister)  . Kidney cancer Paternal Uncle 52       d. 36s; unsure about smoking status    SH:  Married, non smoker  Review of Systems  Genitourinary: Negative for dysuria, frequency and urgency.    PHYSICAL EXAMINATION:    LMP 04/30/1996 (Approximate)     General appearance: alert, cooperative and appears stated age No other exam performed  Assessment: Multiple drug resistant UTI with symptoms that have improved Vaginal atrophic changes likely due to anastrozole use H/O stage IIIA invasive ductal carcinoma  Plan: Urine culture pending.  Results will be communicated to pt and follow-up will be recommended at that time.   ~15 minutes spent with patient with all of the time in face to face discussion of above.

## 2016-10-15 NOTE — Progress Notes (Signed)
[name] is here today for follow up from right breast cancer.  Patient reports having pain but is associated with Fibromyalgia.  Patient reports appetite fluctuates because of off and on nausea, denies having any vomiting.  Patient reports having a good energy level.  Skin to right breast is improving per patient.  She states she occasionally uses a cream for yeast irritation in breast fold.  Vitals:   10/15/16 1522  BP: (!) 129/97  Pulse: 72  Resp: 20  Temp: 98.4 F (36.9 C)  TempSrc: Oral  SpO2: 97%  Weight: 145 lb (65.8 kg)    Wt Readings from Last 3 Encounters:  10/15/16 145 lb (65.8 kg)  10/12/16 144 lb 12.8 oz (65.7 kg)  10/01/16 147 lb (66.7 kg)

## 2016-10-15 NOTE — Progress Notes (Signed)
Radiation Oncology         ([phone]) [phone] ________________________________  Name: [name] MRN: [id number]  Date: 10/15/2016  DOB: 11/22/1945  Follow-Up Visit Note  CC: Seward Carol, MD  Magrinat, Virgie Dad, MD    ICD-10-CM   1. 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  On review of systems, the patient reports pain associated with fibromyalgia. She denies swelling to her upper extremities. She reports her neuropathy has improved recently. She reports fluctuations in appetite due to intermittent nausea. She denies vomiting. The patient reports a good energy level. She reports occasionally using cream for yeast irritation in the breast fold.   The patient is scheduled for follow up with Dr. Jana Hakim in August with a CT scan.   ALLERGIES:  is allergic to penicillins; chlorhexidine; and other.  Meds: Current Outpatient Prescriptions  Medication Sig Dispense Refill  . albuterol (PROVENTIL HFA;VENTOLIN HFA) 108 (90 Base) MCG/ACT inhaler Inhale 1-2 puffs into the lungs every 6 (six) hours as needed for wheezing or shortness of breath. Started in 1998    . anastrozole (ARIMIDEX) 1 MG tablet Take 1 tablet (1 mg total) by mouth daily. 90 tablet 4  . buPROPion (WELLBUTRIN XL) 300 MG 24 hr tablet Take 300 mg by mouth daily.     . Calcium Carbonate-Vitamin D (CALCIUM + D PO) Take 1 tablet by mouth at bedtime. Started approximately 1998    . carisoprodol (SOMA) 350 MG tablet Take 350 mg by mouth 2 (two) times daily as needed for muscle spasms. Started in 2008.  2  . clotrimazole-betamethasone (LOTRISONE)  cream Apply 1 application topically 2 (two) times daily. 30 g 2  . HYDROcodone-acetaminophen (NORCO) 10-325 MG tablet Take 1 tablet by mouth every 4 (four) hours as needed for moderate pain or severe pain (as needed for fibromyalgia and IC).     Marland Kitchen levothyroxine (SYNTHROID) 137 MCG tablet Take 137 mcg by mouth daily.     . Multiple Vitamin (MULTIVITAMIN) capsule Take 1 capsule by mouth daily. Taking since at least 1988.    . NONFORMULARY OR COMPOUNDED ITEM Place 1 suppository vaginally daily.    . pantoprazole (PROTONIX) 40 MG tablet Take 40 mg by mouth daily. Started in 2012.    . triamterene-hydrochlorothiazide (DYAZIDE) 37.5-25 MG capsule Take 1 tablet by mouth daily.     . Vitamin D, Ergocalciferol, (DRISDOL) 50000 units CAPS capsule Take 1 capsule (50,000 Units total) by mouth every 7 (seven) days. 12 capsule 3  . zolpidem (AMBIEN) 10 MG tablet Take 10 mg by mouth at bedtime as needed for sleep.     . cycloSPORINE (RESTASIS) 0.05 % ophthalmic emulsion Place 1 drop into both eyes 2 (two) times daily. (Patient not taking: Reported on 10/15/2016) 5.5 mL 3  . diazepam (VALIUM) 5 MG tablet Take 5 mg by mouth every 6 (six) hours as needed for muscle spasms (interstitial cystitis). Started approximately 2008.    . hydrOXYzine (ATARAX/VISTARIL) 25 MG tablet Take 1 tablet (25 mg total) by mouth 3 (three) times daily as needed for anxiety or itching. (Patient not taking: Reported on  10/15/2016) 30 tablet 0  . LORazepam (ATIVAN) 2 MG tablet Take 1 tablet by mouth daily as needed.  5  . Meth-Hyo-M Bl-Na Phos-Ph Sal (URIBEL) 118 MG CAPS 1 capsule (118 mg total) by Per post-pyloric tube route 4 (four) times daily. (Patient not taking: Reported on 10/15/2016) 30 capsule 2  . ondansetron (ZOFRAN) 4 MG tablet Take 1 tablet by mouth daily as needed.  3  . Polyethyl Glycol-Propyl Glycol (SYSTANE OP) Place 1 drop into both eyes as needed (for dry eyes). Has taken since at least 2015.    . prochlorperazine (COMPAZINE)  10 MG tablet Take 1 tablet (10 mg total) by mouth every 6 (six) hours as needed (Nausea or vomiting). (Patient not taking: Reported on 10/15/2016) 30 tablet 1  . terconazole (TERAZOL 7) 0.4 % vaginal cream Place 1 applicator vaginally at bedtime. (Patient not taking: Reported on 10/15/2016) 45 g 0   No current facility-administered medications for this encounter.    REVIEW OF SYSTEMS: A 10+ POINT REVIEW OF SYSTEMS WAS OBTAINED including neurology, dermatology, psychiatry, cardiac, respiratory, lymph, extremities, GI, GU, musculoskeletal, constitutional, reproductive, HEENT. All pertinent positives are noted in the HPI. All others are negative.  Physical Findings: The patient is in no acute distress. Patient is alert and oriented.  weight is 145 lb (65.8 kg). Her oral temperature is 98.4 F (36.9 C). Her blood pressure is 129/97 (abnormal) and her pulse is 72. Her respiration is 20 and oxygen saturation is 97%.  No significant changes. Lungs are clear to auscultation bilaterally. Heart has regular rate and rhythm. No palpable cervical, supraclavicular, or axillary adenopathy. Abdomen soft, non-tender, with normal bowel sounds. Left breast: No palpable mass or nipple discharge. Right breast: Some erythema, but overall the erythema is less compared to picture taken in Medical Oncology on 08/15/16. No dominant mass appreciated in the breast. No nipple discharge or bleeding.   Lab Findings: Lab Results  Component Value Date   WBC 6.0 09/12/2016   HGB 13.8 09/12/2016   HCT 40.2 09/12/2016   MCV 96.3 09/12/2016   PLT 257 09/12/2016    Radiographic Findings: No results found.  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 The patient is recovering from the effects of radiation. No evidence of recurrence on clinical exam today. She is clinically doing well.  Plan:  Follow closely with Dr. Jana Hakim as indicated. Follow up with  radiation oncology as needed.   -----------------------------------  Blair Promise, PhD, MD  This document serves as a record of services personally performed by Gery Pray, MD. It was created on his behalf by Maryla Morrow, a trained medical scribe. The creation of this record is based on the scribe's personal observations and the provider's statements to them. This document has been checked and approved by the attending provider.

## 2016-10-16 ENCOUNTER — Institutional Professional Consult (permissible substitution): Payer: Medicare Other | Admitting: Obstetrics & Gynecology

## 2016-10-24 ENCOUNTER — Encounter: Payer: Self-pay | Admitting: Pharmacist

## 2016-10-24 DIAGNOSIS — C50411 Malignant neoplasm of upper-outer quadrant of right female breast: Secondary | ICD-10-CM

## 2016-10-24 DIAGNOSIS — Z170 Estrogen receptor positive status [ER+]: Secondary | ICD-10-CM

## 2016-10-24 NOTE — Progress Notes (Signed)
Telephone documentation  Study code: [id number]  Spoke with patient over the phone today and reviewed with her the pharmacogenetic information listed below for the four genes of interest of the "Pharmacogenetic analysis of toxicities related to administration of taxanes in breast cancer patients" study. The information below was reviewed with the patient. Offered the patient the option of speaking with a [facility] genetic counselor and she was not interested in making an appointment. Instructed the patient to call me if she had additional questions.  **The buccal swab testing was NOT conducted at a CLIA validated lab. The patient was reminded that the information given was for informational proposes only and should NOT be used to make clinical decisions.   Gene Phenotype  CYP3A4 Normal metabolizer   CYP3A5 Poor metabolizer   SLCO1B1 Intermediate function  ABCB1 Normal function    Darl Pikes, PharmD, [place] Clinical Pharmacist- Oncology Pharmacy Resident [phone]

## 2016-10-24 NOTE — Progress Notes (Signed)
Telephone documentation  Study code: [id number]  Attempted to call patient and review with her the pharmacogenetic information that was obtained for the "Pharmacogenetic analysis of toxicities related to administration of taxanes in breast cancer patients" study. Unable to reach patient. Left voicemail and provided my office number for the patient to call back.  Skyann Ganim N. Shinichi Anguiano, PharmD, BCPS Clinical Pharmacist- Oncology Pharmacy Resident [phone]  

## 2016-10-25 ENCOUNTER — Encounter (HOSPITAL_COMMUNITY): Payer: Self-pay

## 2016-10-25 ENCOUNTER — Ambulatory Visit (HOSPITAL_BASED_OUTPATIENT_CLINIC_OR_DEPARTMENT_OTHER): Payer: Medicare Other | Admitting: Adult Health

## 2016-10-25 ENCOUNTER — Encounter: Payer: Self-pay | Admitting: Adult Health

## 2016-10-25 VITALS — BP 126/61 | HR 75 | Temp 98.2°F | Resp 18 | Ht 62.75 in | Wt 144.6 lb

## 2016-10-25 DIAGNOSIS — Z9221 Personal history of antineoplastic chemotherapy: Secondary | ICD-10-CM

## 2016-10-25 DIAGNOSIS — Z170 Estrogen receptor positive status [ER+]: Secondary | ICD-10-CM

## 2016-10-25 DIAGNOSIS — E2839 Other primary ovarian failure: Secondary | ICD-10-CM

## 2016-10-25 DIAGNOSIS — Z79811 Long term (current) use of aromatase inhibitors: Secondary | ICD-10-CM

## 2016-10-25 DIAGNOSIS — C50411 Malignant neoplasm of upper-outer quadrant of right female breast: Secondary | ICD-10-CM

## 2016-10-25 DIAGNOSIS — Z923 Personal history of irradiation: Secondary | ICD-10-CM

## 2016-10-25 NOTE — Progress Notes (Signed)
CLINIC:  Survivorship   REASON FOR VISIT:  Routine follow-up post-treatment for a recent history of breast cancer.  BRIEF ONCOLOGIC HISTORY:    Malignant neoplasm of upper-outer quadrant of right breast in female, estrogen receptor positive ([place])   08/23/2015 Initial Biopsy    Right breast upper outer quadrant and lymph node biposy: IDC, grade 2, ER+, PR 5% +, Ki-67 20-30%, HER-2 negative.       09/01/2015 Initial Diagnosis    Malignant neoplasm of upper-outer quadrant of right breast in female, estrogen receptor positive ([place])     09/12/2015 - 11/14/2015 Neo-Adjuvant Chemotherapy    Docetaxel and Cyclophosphamide x 4      09/13/2015 Initial Biopsy    Satelite nodule biopsy: IDC, grade 2, identical prognostic panel to previous biopsy      12/05/2015 Surgery    Right lumpectomy with ALND Dalbert Batman): IDC grade 2, 1.5cm, DCIS, + lymphovascular invasion, 15/17 SLN positive      01/18/2016 - 03/05/2016 Radiation Therapy    Adjuvant radiation with capecitabine ([place]): 1) Right breast/nodal areas: 45 Gy in 25 fractions  2) Right breast boost: 16 Gy in 8 fractions      02/07/2016 Genetic Testing    Genetic testing was normal, and did not reveal a deleterious mutation.  Additionally, no variants of uncertain significance (VUSes) were found.  Genes tested include: ATM, BARD1, BRCA1, BRCA2, BRIP1, CDH1, CHEK2, FANCC, MLH1, MSH2, MSH6, NBN, PALB2, PMS2, PTEN, RAD51C, RAD51D, TP53, and XRCC2.  This panel also includes deletion/duplication analysis (without sequencing) for one gene, EPCAM.      06/01/2016 -  Anti-estrogen oral therapy    Anastrozole daily (on pallas)       INTERVAL HISTORY:  Ms. Schneider presents to the [facility] today for our initial meeting to review her survivorship care plan detailing her treatment course for breast cancer, as well as monitoring long-term side effects of that treatment, education regarding health maintenance, screening, and overall wellness and  health promotion.     Overall, Ms. Troutman reports feeling quite well.  She is taking Anastrozole daily.  She is tolerating it well.  She is due for CT chest in august prior to her appointment with Dr. Jana Hakim.  She is having some mild hot flashes, decreased appetite, and mild nausea.  She is still fatigued.  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    REVIEW OF SYSTEMS:  Review of Systems  Constitutional: Negative for appetite change, chills, fatigue, fever and unexpected weight change.  HENT:   Negative for hearing loss and lump/mass.   Eyes: Negative for eye problems and icterus.  Respiratory: Negative for chest tightness, cough and shortness of breath.   Cardiovascular: Negative for chest pain, leg swelling and palpitations.  Gastrointestinal: Negative for abdominal distention and abdominal pain.  Endocrine: Positive for hot flashes.  Genitourinary: Negative for difficulty urinating and dyspareunia.   Musculoskeletal: Negative for arthralgias and back pain.  Skin: Negative for itching and rash.  Neurological: Positive for numbness. Negative for dizziness, extremity weakness and headaches.  Hematological: Negative for adenopathy.  Psychiatric/Behavioral: Negative for depression. The patient is not nervous/anxious.   Breast: Denies any new nodularity, masses, tenderness, nipple changes, or nipple discharge.      ONCOLOGY TREATMENT TEAM:  1. Surgeon:  Dr. Dalbert Batman at [facility] 2. Medical Oncologist: Dr. Jana Hakim  3. Radiation Oncologist: Dr. Sondra Come    PAST MEDICAL/SURGICAL HISTORY:  Past Medical History:  Diagnosis Date  . Anxiety   . Arthritis 2008  . Asthma 1953   age 42  . Cancer [facility])    breast cancer  . Chronic vaginitis 1998  . Fibromyalgia 1982  . GERD (gastroesophageal reflux disease)    since at least 2011  . Hemorrhoids 2009  . History of radiation  therapy 01/18/16-03/05/16   right breast/nodal areas 45 Gy, right breast boost 16 Gy  . Hypoglycemia 2003  . Hypothyroidism 1964   age 58  . Interstitial cystitis 1998  . Irritable bowel syndrome 06/14/2009   Qualifier: Diagnosis of  By: Patsy Baltimore RN, Langley Gauss    . Menopausal symptoms   . Osteoporosis 12/24/2006  . Pneumonia 1979   varicella  . PONV (postoperative nausea and vomiting) 1979   Past Surgical History:  Procedure Laterality Date  . BLADDER HYPEREXTENSION     X4, prior to 01/2006  . BLADDER SUSPENSION    . BREAST LUMPECTOMY WITH NEEDLE LOCALIZATION AND AXILLARY LYMPH NODE DISSECTION Right 12/05/2015   Procedure: RIGHT BREAST NEEDLE LOCALIZED (WIRES X 2) LUMPECTOMY WITH AXILLARY LYMPH NODE DISSECTION;  Surgeon: Fanny Skates, MD;  Location: [place];  Service: General;  Laterality: Right;  . BREAST LUMPECTOMY WITH RADIOACTIVE SEED LOCALIZATION Left 12/05/2015   Procedure: RADIOACTIVE SEED GUIDED LEFT BREAST LUMPECTOMY;  Surgeon: Fanny Skates, MD;  Location: [place];  Service: General;  Laterality: Left;  . BREAST SURGERY    . BUNIONECTOMY     prior to 01/2006  . CESAREAN SECTION  1979  . CHOLECYSTECTOMY  02/08/2006   DR. INGRAM  . COLONOSCOPY    . DENTAL SURGERY  2009-2016   13 dental implants  . LEG SURGERY  07/06/2004   Broken tibia  . NASAL SINUS SURGERY     X4; prior to 01/2006  . PORT-A-CATH REMOVAL Right 12/05/2015   Procedure: REMOVAL PORT-A-CATH;  Surgeon: Fanny Skates, MD;  Location: [place];  Service: General;  Laterality: Right;  . PORTACATH PLACEMENT Left 09/08/2015   Procedure: INSERTION OF PORT-A-CATH ;  Surgeon: Fanny Skates, MD;  Location: [place];  Service: General;  Laterality: Left;     ALLERGIES:  Allergies  Allergen Reactions  . Penicillins Other (See Comments)    Has patient had a PCN reaction causing immediate rash, facial/tongue/throat swelling, SOB or lightheadedness with hypotension: No Has patient had a PCN reaction causing severe rash involving  mucus membranes or skin necrosis: No Has patient had a PCN reaction that required hospitalization No Has patient had a PCN reaction occurring within the last 10 years: No If all of the above answers are "NO", then may proceed with Cephalosporin use.  . Chlorhexidine Other (See Comments)    P[place]on IS OK. The CLOTH WIPE causes reaction. Causes itching all over the area  . Other Rash    Adhesive tape     CURRENT MEDICATIONS:  Outpatient Encounter Prescriptions as of 10/25/2016  Medication Sig Note  . albuterol (PROVENTIL HFA;VENTOLIN HFA) 108 (90 Base) MCG/ACT inhaler Inhale 1-2 puffs into the lungs every 6 (six) hours as needed for wheezing or shortness of breath. Started in 1998   . anastrozole (ARIMIDEX) 1 MG tablet Take 1 tablet (1 mg total) by mouth daily. 06/29/2016: First dose taken 06/01/2016.  Marland Kitchen buPROPion (WELLBUTRIN XL) 300 MG 24 hr tablet Take 300 mg by mouth daily.    . Calcium Carbonate-Vitamin D (CALCIUM + D PO) Take 1 tablet by mouth at bedtime. Started approximately 1998   . carisoprodol (  SOMA) 350 MG tablet Take 350 mg by mouth 2 (two) times daily as needed for muscle spasms. Started in 2008.   . clotrimazole-betamethasone (LOTRISONE) cream Apply 1 application topically 2 (two) times daily.   . cycloSPORINE (RESTASIS) 0.05 % ophthalmic emulsion Place 1 drop into both eyes 2 (two) times daily. (Patient not taking: Reported on 10/15/2016)   . diazepam (VALIUM) 5 MG tablet Take 5 mg by mouth every 6 (six) hours as needed for muscle spasms (interstitial cystitis). Started approximately 2008.   Marland Kitchen HYDROcodone-acetaminophen (NORCO) 10-325 MG tablet Take 1 tablet by mouth every 4 (four) hours as needed for moderate pain or severe pain (as needed for fibromyalgia and IC).    . hydrOXYzine (ATARAX/VISTARIL) 25 MG tablet Take 1 tablet (25 mg total) by mouth 3 (three) times daily as needed for anxiety or itching. (Patient not taking: Reported on 10/15/2016) 07/17/2016: Taking for interstitial cystitis  since at least 2013.  Marland Kitchen levothyroxine (SYNTHROID) 137 MCG tablet Take 137 mcg by mouth daily.    Marland Kitchen LORazepam (ATIVAN) 2 MG tablet Take 1 tablet by mouth daily as needed.   . Multiple Vitamin (MULTIVITAMIN) capsule Take 1 capsule by mouth daily. Taking since at least 1988.   . NONFORMULARY OR COMPOUNDED ITEM Place 1 suppository vaginally daily.   . ondansetron (ZOFRAN) 4 MG tablet Take 1 tablet by mouth daily as needed.   . pantoprazole (PROTONIX) 40 MG tablet Take 40 mg by mouth daily. Started in 2012.   Vladimir Faster Glycol-Propyl Glycol (SYSTANE OP) Place 1 drop into both eyes as needed (for dry eyes). Has taken since at least 2015.   . prochlorperazine (COMPAZINE) 10 MG tablet Take 1 tablet (10 mg total) by mouth every 6 (six) hours as needed (Nausea or vomiting). (Patient not taking: Reported on 10/15/2016)   . terconazole (TERAZOL 7) 0.4 % vaginal cream Place 1 applicator vaginally at bedtime. (Patient not taking: Reported on 10/15/2016)   . triamterene-hydrochlorothiazide (DYAZIDE) 37.5-25 MG capsule Take 1 tablet by mouth daily.    . Vitamin D, Ergocalciferol, (DRISDOL) 50000 units CAPS capsule Take 1 capsule (50,000 Units total) by mouth every 7 (seven) days.   Marland Kitchen zolpidem (AMBIEN) 10 MG tablet Take 10 mg by mouth at bedtime as needed for sleep.    . [DISCONTINUED] Meth-Hyo-M Bl-Na Phos-Ph Sal (URIBEL) 118 MG CAPS 1 capsule (118 mg total) by Per post-pyloric tube route 4 (four) times daily. (Patient not taking: Reported on 10/15/2016)    No facility-administered encounter medications on file as of 10/25/2016.      ONCOLOGIC FAMILY HISTORY:  Family History  Problem Relation Age of Onset  . Hypertension Mother   . Heart disease Mother        d. 90  . Parkinsonism Father        d. 54  . Heart disease Sister   . Breast cancer Sister 48       s/p mastectomy; reportedly negative BRCA1/2 testing approx 7 years ago  . Melanoma Sister        d. 59s; hx of incresed sun exposure  . Cancer  Sister        dx. sarcoma of her face in her 80s; treated w/ MOHS surgery  . Parkinsonism Paternal Aunt        d. 90s  . Parkinsonism Paternal Uncle        d. late 72s  . Kidney failure Maternal Grandmother 33  . Heart attack Maternal Grandfather  d. 84s  . Heart disease Paternal Grandmother        d. 69  . Breast cancer Other 75       maternal great aunt (MGF's sister)  . Kidney cancer Paternal Uncle 71       d. 32s; unsure about smoking status      SOCIAL HISTORY:  NYGERIA LAGER is married and lives with her husband in [place], [place].  She has 2 children and they live in [place] and [place].  Ms. Botkin is currently retired.  She denies any current or history of tobacco, alcohol, or illicit drug use.     PHYSICAL EXAMINATION:  Vital Signs:   Vitals:   10/25/16 1343  BP: 126/61  Pulse: 75  Resp: 18  Temp: 98.2 F (36.8 C)   Filed Weights   10/25/16 1343  Weight: 144 lb 9.6 oz (65.6 kg)   General: Well-nourished, well-appearing female in no acute distress.  She is unaccompanied today.   HEENT: Head is normocephalic.  Pupils equal and reactive to light. Conjunctivae clear without exudate.  Sclerae anicteric. Oral mucosa is pink, moist.  Oropharynx is pink without lesions or erythema.  Lymph: No cervical, supraclavicular, or infraclavicular lymphadenopathy noted on palpation.  Cardiovascular: Regular rate and rhythm.Marland Kitchen Respiratory: Clear to auscultation bilaterally. Chest expansion symmetric; breathing non-labored.  GI: Abdomen soft and round; non-tender, non-distended. Bowel sounds normoactive.  GU: Deferred.  Neuro: No focal deficits. Steady gait.  Psych: Mood and affect normal and appropriate for situation.  Extremities: No edema. MSK: No focal spinal tenderness to palpation.  Full range of motion in bilateral upper extremities Skin: Warm and dry.  LABORATORY DATA:  None for this visit.  DIAGNOSTIC IMAGING:  None for this visit.       ASSESSMENT AND PLAN:  Ms.. Pasley is a pleasant 71 y.o. 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  She presents to the Survivorship Clinic for our initial meeting and routine follow-up post-completion of treatment for breast cancer.    1. Stage IIIC right breast cancer:  Ms. Gambino is continuing to recover from definitive treatment for breast cancer. She will follow-up with her medical oncologist, Dr. Jana Hakim in August, 2018 with history and physical exam per surveillance protocol.  She will continue her anti-estrogen therapy with Anastrozole. Thus far, she is tolerating the Anastrozole well, with minimal side effects. She was instructed to make Dr. Jana Hakim or myself aware if she begins to experience any worsening side effects of the medication and I could see her back in clinic to help manage those side effects, as needed. She does have some residual side effects since cancer treatemtn, however she is really doing quite well and these issues are minimal.  I did give her a script for a class 1 lymphedema sleeve and glove today in clinic for lymphedema prevention. Today, a comprehensive survivorship care plan and treatment summary was reviewed with the patient today detailing her breast cancer diagnosis, treatment course, potential late/long-term effects of treatment, appropriate follow-up care with recommendations for the future, and patient education resources.  A copy of this summary, along with a letter will be sent to the patient's primary care provider via mail/fax/In Basket message after today's visit.    2. Bone health:  Given Ms. Tinnon's age/history of breast cancer and her current treatment regimen including anti-estrogen therapy with Anastrozole, she  is at risk for bone demineralization. She  is due for DEXA scan. In the meantime, she was encouraged to increase her consumption of foods rich in calcium, as well as increase her weight-bearing activities.  She was given education on specific activities to promote bone health.  3. Cancer screening:  Due to Ms. Bothwell's history and her age, she should receive screening for skin cancers, colon cancer, and gynecologic cancers.  The information and recommendations are listed on the patient's comprehensive care plan/treatment summary and were reviewed in detail with the patient.    4. Health maintenance and wellness promotion: Ms. Mossa was encouraged to consume 5-7 servings of fruits and vegetables per day. We reviewed the "Nutrition Rainbow" handout, as well as the handout "Take Control of Your Health and Reduce Your Cancer Risk" from the [facility].  She was also encouraged to engage in moderate to vigorous exercise for 30 minutes per day most days of the week. We discussed the [employer] fitness program, which is designed for cancer survivors to help them become more physically fit after cancer treatments.  She was instructed to limit her alcohol consumption and continue to abstain from tobacco use.     5. Support services/counseling: It is not uncommon for this period of the patient's cancer care trajectory to be one of many emotions and stressors.  We discussed an opportunity for her to participate in the next session of [facility] ("Finding Your New Normal") support group series designed for patients after they have completed treatment.   Ms. Rosekrans was encouraged to take advantage of our many other support services programs, support groups, and/or counseling in coping with her new life as a cancer survivor after completing anti-cancer treatment.  She was offered support today through active listening and expressive supportive counseling.  She was given information regarding our available services and encouraged to contact me with any  questions or for help enrolling in any of our support group/programs.    Dispo:   -Return to cancer center for follow up with Dr. Jana Hakim in August, 2018 -Follow up with Dr. Dalbert Batman in 10-02/2017  -Mammogram due in August, 2018 -Bone Density in August, 2018 -She is welcome to return back to the Survivorship Clinic at any time; no additional follow-up needed at this time.  -Consider referral back to survivorship as a long-term survivor for continued surveillance  A total of (45) minutes of face-to-face time was spent with this patient with greater than 50% of that time in counseling and care-coordination.   Gardenia Phlegm, NP Survivorship Program [facility] [phone]   Note: PRIMARY CARE PROVIDER [place], [place] [phone]

## 2016-11-09 ENCOUNTER — Other Ambulatory Visit: Payer: Self-pay | Admitting: *Deleted

## 2016-11-09 DIAGNOSIS — Z170 Estrogen receptor positive status [ER+]: Principal | ICD-10-CM

## 2016-11-09 DIAGNOSIS — C50411 Malignant neoplasm of upper-outer quadrant of right female breast: Secondary | ICD-10-CM

## 2016-11-13 ENCOUNTER — Encounter (HOSPITAL_COMMUNITY): Payer: Self-pay

## 2016-11-28 ENCOUNTER — Encounter (HOSPITAL_COMMUNITY): Payer: Self-pay

## 2016-11-28 ENCOUNTER — Other Ambulatory Visit (HOSPITAL_BASED_OUTPATIENT_CLINIC_OR_DEPARTMENT_OTHER): Payer: Medicare Other

## 2016-11-28 ENCOUNTER — Ambulatory Visit (HOSPITAL_COMMUNITY)
Admission: RE | Admit: 2016-11-28 | Discharge: 2016-11-28 | Disposition: A | Discharge status: Home / Self Care | Payer: Medicare Other | Source: Ambulatory Visit | Attending: Oncology | Admitting: Oncology

## 2016-11-28 DIAGNOSIS — G609 Hereditary and idiopathic neuropathy, unspecified: Secondary | ICD-10-CM | POA: Insufficient documentation

## 2016-11-28 DIAGNOSIS — Z006 Encounter for examination for normal comparison and control in clinical research program: Secondary | ICD-10-CM | POA: Diagnosis not present

## 2016-11-28 DIAGNOSIS — Z170 Estrogen receptor positive status [ER+]: Secondary | ICD-10-CM | POA: Insufficient documentation

## 2016-11-28 DIAGNOSIS — M818 Other osteoporosis without current pathological fracture: Secondary | ICD-10-CM

## 2016-11-28 DIAGNOSIS — C50411 Malignant neoplasm of upper-outer quadrant of right female breast: Secondary | ICD-10-CM

## 2016-11-28 DIAGNOSIS — J439 Emphysema, unspecified: Secondary | ICD-10-CM | POA: Insufficient documentation

## 2016-11-28 DIAGNOSIS — M797 Fibromyalgia: Secondary | ICD-10-CM | POA: Insufficient documentation

## 2016-11-28 LAB — CBC WITH DIFFERENTIAL/PLATELET
BASO%: 0.9 % (ref 0.0–2.0)
BASOS ABS: 0.1 10*3/uL (ref 0.0–0.1)
EOS ABS: 0.1 10*3/uL (ref 0.0–0.5)
EOS%: 1.2 % (ref 0.0–7.0)
HCT: 42.5 % (ref 34.8–46.6)
HGB: 14.2 g/dL (ref 11.6–15.9)
LYMPH%: 18.8 % (ref 14.0–49.7)
MCH: 32.2 pg (ref 25.1–34.0)
MCHC: 33.5 g/dL (ref 31.5–36.0)
MCV: 96.1 fL (ref 79.5–101.0)
MONO#: 0.5 10*3/uL (ref 0.1–0.9)
MONO%: 6.9 % (ref 0.0–14.0)
NEUT%: 72.2 % (ref 38.4–76.8)
NEUTROS ABS: 5.3 10*3/uL (ref 1.5–6.5)
Platelets: 297 10*3/uL (ref 145–400)
RBC: 4.42 10*6/uL (ref 3.70–5.45)
RDW: 13.8 % (ref 11.2–14.5)
WBC: 7.3 10*3/uL (ref 3.9–10.3)
lymph#: 1.4 10*3/uL (ref 0.9–3.3)

## 2016-11-28 LAB — COMPREHENSIVE METABOLIC PANEL
ALT: 10 U/L (ref 0–55)
AST: 15 U/L (ref 5–34)
Albumin: 3.8 g/dL (ref 3.5–5.0)
Alkaline Phosphatase: 89 U/L (ref 40–150)
Anion Gap: 10 mEq/L (ref 3–11)
BUN: 9.9 mg/dL (ref 7.0–26.0)
CALCIUM: 9.4 mg/dL (ref 8.4–10.4)
CHLORIDE: 100 meq/L (ref 98–109)
CO2: 27 mEq/L (ref 22–29)
CREATININE: 0.9 mg/dL (ref 0.6–1.1)
EGFR: 68 mL/min/{1.73_m2} — ABNORMAL LOW (ref >90)
Glucose: 104 mg/dl (ref 70–140)
Potassium: 3.9 mEq/L (ref 3.5–5.1)
Sodium: 137 mEq/L (ref 136–145)
TOTAL PROTEIN: 6.8 g/dL (ref 6.4–8.3)
Total Bilirubin: 0.55 mg/dL (ref 0.20–1.20)

## 2016-11-28 LAB — RESEARCH LABS

## 2016-11-28 MED ORDER — IOPAMIDOL (ISOVUE-300) INJECTION 61%
INTRAVENOUS | Status: AC
  Filled 2016-11-28: qty 75

## 2016-11-28 MED ORDER — IOPAMIDOL (ISOVUE-300) INJECTION 61%
75.0000 mL | Freq: Once | INTRAVENOUS | Status: AC | PRN
  Administered 2016-11-28: 75 mL via INTRAVENOUS

## 2016-11-29 LAB — HEMOGLOBIN A1C
Est. average glucose Bld gHb Est-mCnc: 103 mg/dL
Hemoglobin A1c: 5.2 % (ref 4.8–5.6)

## 2016-12-02 NOTE — Progress Notes (Signed)
[facility]  [phone] [phone]) [phone]     ID: [name] DOB: 07-May-1945  MR#: [id number]  WGY#:[id number]  Patient Care Team: Seward Carol, MD as PCP - General (Internal Medicine) Tareva Leske, Virgie Dad, MD as Consulting Physician (Oncology) Fanny Skates, MD as Consulting Physician (General Surgery) Luberta Robertson, MD (Radiology) Hennie Duos, MD as Consulting Physician (Rheumatology) Domingo Pulse, MD (Urology) Druscilla Brownie, MD as Consulting Physician (Dermatology) Luberta Mutter, MD as Consulting Physician (Ophthalmology) Magnus Sinning, MD as Consulting Physician (Physical Medicine and Rehabilitation) Benson Norway, RN as Registered Nurse (Oncology) Megan Salon, MD as Consulting Physician (Gynecology) Loney Loh, MD (Dermatology) OTHER MD:  CHIEF COMPLAINT: Estrogen receptor positive breast cancer  CURRENT TREATMENT:  Anastrozole  INTERVAL HISTORY: Kanyah returns today for follow-up of her estrogen receptor positive breast cancer, accompanied by her husband and by her study nurse [name]. Saroya continues on anastrozole, with generally good tolerance.Hot flashes and vaginal dryness are not a major issue. She never developed the arthralgias or myalgias that many patients can experience on this medication. She obtains it at a good price.   REVIEW OF SYSTEMS: Aimy still has very little hair in the crown of her head although the front now looks better. She does not enjoy wearing a wig but there is awaiting in [place] that she has to go to and she cannot "colitis". She has been busy enjoying her "filthy babies" ("all babies are filthy"). She denies any intercurrent infections, unusual headaches, dizziness, or gait imbalance. She occasionally has some double vision. This has been evaluated with no evidence of anatomic findings and she is considering getting a prism pair of glasses to correct that as was suggested by her  ophthalmologist. Aside from these issues a detailed review of systems today was stable  BREAST CANCER HISTORY: From the original intake note:  Alphonsine had routine mammographic screening in 08/18/2015 at the [facility], showing a right breast mass and possible axillary adenopathy. On 08/23/2015 she underwent bilateral diagnostic mammography with tomography and bilateral breast ultrasonography. The breast density was category B in the right breast there was a spiculated mass in the upper-outer quadrant measuring 1.2 cm and associated with pleomorphic calcifications. There were calcifications extending anteriorly and posteriorly from the mass worrisome for ductal carcinoma in situ. The total area in question is about 3 cm. There were also multiple enlarged right axillary lymph nodes. The mass was palpated as an area of fullness at the 12:00 position of the breast 3 cm from the nipple. There were palpable enlarged right axillary lymph nodes in the right axilla. Ultrasonography of the right breast confirmed an irregular hypoechoic mass in the area in question measuring 1.6 cm. There were multiple abnormal appearing enlarged right axillary lymph nodes, the largest measuring 2.3 cm.  In the left breast, mammography showed an area of architectural distortion associated with the left nipple. This was not palpable. Ultrasonography of the left breast found no correlate. This area of the left breast was biopsied under tomography 08/24/2015. This showed (SAA [id number]) a complex sclerosing lesion and fibroadenoma.  On 08/23/2015 the patient underwent biopsy of the right breast mass in 1 of the abnormal right axillary lymph nodes. 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 Both biopsies were HER-2 nonamplified, the breast being ratio 1.27 and number per cell 1.90,  and the lymph node 1 ratio 1.24 and number per cell 3.0.  On 08/26/2015 the patient underwent bilateral breast MRI. In the left breast there was no suspicious enhancement. The left axilla was benign. In the right breast there was a mass described as in the upper inner quadrant measuring 2.3 cm. Less than a centimeter away from this mass there was a satellite nodule measuring 0.9 cm. In the right axilla there were multiple enlarged right axillary lymph nodes, including a confluent region measuring up to 4 cm. There were also enlarged level II lymph nodes present measuring up to 2.1 cm.  The patient's subsequent history is as detailed below  PAST MEDICAL HISTORY: Past Medical History:  Diagnosis Date  . Anxiety   . Arthritis 2008  . Asthma 1953   age 61  . Cancer [facility])    breast cancer  . Chronic vaginitis 1998  . Fibromyalgia 1982  . GERD (gastroesophageal reflux disease)    since at least 2011  . Hemorrhoids 2009  . History of radiation therapy 01/18/16-03/05/16   right breast/nodal areas 45 Gy, right breast boost 16 Gy  . Hypoglycemia 2003  . Hypothyroidism 1964   age 89  . Interstitial cystitis 1998  . Irritable bowel syndrome 06/14/2009   Qualifier: Diagnosis of  By: Patsy Baltimore RN, Langley Gauss    . Menopausal symptoms   . Osteoporosis 12/24/2006  . Pneumonia 1979   varicella  . PONV (postoperative nausea and vomiting) 1979    PAST SURGICAL HISTORY: Past Surgical History:  Procedure Laterality Date  . BLADDER HYPEREXTENSION     X4, prior to 01/2006  . BLADDER SUSPENSION    . BREAST LUMPECTOMY WITH NEEDLE LOCALIZATION AND AXILLARY LYMPH NODE DISSECTION Right 12/05/2015   Procedure: RIGHT BREAST NEEDLE LOCALIZED (WIRES X 2) LUMPECTOMY WITH AXILLARY LYMPH NODE DISSECTION;  Surgeon: Fanny Skates, MD;  Location: [place];  Service: General;  Laterality: Right;  . BREAST LUMPECTOMY WITH RADIOACTIVE SEED LOCALIZATION Left 12/05/2015   Procedure: RADIOACTIVE SEED GUIDED LEFT BREAST LUMPECTOMY;   Surgeon: Fanny Skates, MD;  Location: [place];  Service: General;  Laterality: Left;  . BREAST SURGERY    . BUNIONECTOMY     prior to 01/2006  . CESAREAN SECTION  1979  . CHOLECYSTECTOMY  02/08/2006   DR. INGRAM  . COLONOSCOPY    . DENTAL SURGERY  2009-2016   13 dental implants  . LEG SURGERY  07/06/2004   Broken tibia  . NASAL SINUS SURGERY     X4; prior to 01/2006  . PORT-A-CATH REMOVAL Right 12/05/2015   Procedure: REMOVAL PORT-A-CATH;  Surgeon: Fanny Skates, MD;  Location: [place];  Service: General;  Laterality: Right;  . PORTACATH PLACEMENT Left 09/08/2015   Procedure: INSERTION OF PORT-A-CATH ;  Surgeon: Fanny Skates, MD;  Location: [facility] OR;  Service: General;  Laterality: Left;    FAMILY HISTORY Family History  Problem Relation Age of Onset  . Hypertension Mother   . Heart disease Mother        d. 10  . Parkinsonism Father        d. 16  . Heart disease Sister   . Breast cancer Sister 71       s/p mastectomy; reportedly negative BRCA1/2 testing approx 7 years ago  . Melanoma Sister        d. 69s; hx of incresed sun exposure  . Cancer Sister        dx. sarcoma of her face in her 38s; treated w/ MOHS  surgery  . Parkinsonism Paternal Aunt        d. 90s  . Parkinsonism Paternal Uncle        d. late 28s  . Kidney failure Maternal Grandmother 61  . Heart attack Maternal Grandfather        d. 35s  . Heart disease Paternal Grandmother        d. 72  . Breast cancer Other 55       maternal great aunt (MGF's sister)  . Kidney cancer Paternal Uncle 15       d. 93s; unsure about smoking status  The patient's father died at age 57 from complications of Parkinson's disease. The patient's mother died age 72 with heart disease. Shanaiya had no brothers. She had 2 sisters. One sister was diagnosed with breast cancer at age 68. She was tested for the BRCA gene and was negative. The same sister also had melanoma diagnosed in her 46s. The patient has one maternal great aunt diagnosed with  breast cancer around age 42. There is no history of ovarian cancer in the family  GYNECOLOGIC HISTORY:  Patient's last menstrual period was 04/30/1996 (approximate). Menarche age 52, first live birth age 60. The patient is GX P2. She stopped having periods in her early 57s and took hormone replacement until her breast cancer diagnosis, April 2017.  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 He has been in remission for more than 10 years. Their daughter Nira Conn lives in [place] were she is Garment/textile technologist of the [employer]. Daughter Belenda Cruise is an infectious disease M.D. working and teaching at [employer] in [place]. The patient has 2 grandchildren. The family attends [employer]: Not in place   HEALTH MAINTENANCE: Social History  Substance Use Topics  . Smoking status: Former Smoker    Packs/day: 0.25    Years: 15.00    Types: Cigarettes  . Smokeless tobacco: Never Used     Comment: social smoker/"on and off"  . Alcohol use 0.0 oz/week     Comment: social     Colonoscopy: 2016/ [facility]  PAP: 2015  Bone density:   Lipid panel:  Allergies  Allergen Reactions  . Penicillins Other (See Comments)    Has patient had a PCN reaction causing immediate rash, facial/tongue/throat swelling, SOB or lightheadedness with hypotension: No Has patient had a PCN reaction causing severe rash involving mucus membranes or skin necrosis: No Has patient had a PCN reaction that required hospitalization No Has patient had a PCN reaction occurring within the last 10 years: No If all of the above answers are "NO", then may proceed with Cephalosporin use.  . Chlorhexidine Other (See Comments)    [place] IS OK. The CLOTH WIPE causes reaction. Causes itching all over the area  .  Other Rash    Adhesive tape    Current Outpatient Prescriptions  Medication Sig Dispense Refill  . albuterol (PROVENTIL HFA;VENTOLIN HFA) 108 (90 Base) MCG/ACT inhaler Inhale 1-2 puffs into the lungs every 6 (six) hours as needed for wheezing or shortness of breath. Started in 1998    . anastrozole (ARIMIDEX) 1 MG tablet Take 1 tablet (1 mg total) by mouth daily. 90 tablet 4  . buPROPion (WELLBUTRIN XL) 300 MG 24 hr tablet Take 300 mg by  mouth daily.     . Calcium Carbonate-Vitamin D (CALCIUM + D PO) Take 1 tablet by mouth at bedtime. Started approximately 1998    . carisoprodol (SOMA) 350 MG tablet Take 350 mg by mouth 2 (two) times daily as needed for muscle spasms. Started in 2008.  2  . clotrimazole-betamethasone (LOTRISONE) cream Apply 1 application topically 2 (two) times daily. 30 g 2  . cycloSPORINE (RESTASIS) 0.05 % ophthalmic emulsion Place 1 drop into both eyes 2 (two) times daily. (Patient not taking: Reported on 10/15/2016) 5.5 mL 3  . diazepam (VALIUM) 5 MG tablet Take 5 mg by mouth every 6 (six) hours as needed for muscle spasms (interstitial cystitis). Started approximately 2008.    Marland Kitchen HYDROcodone-acetaminophen (NORCO) 10-325 MG tablet Take 1 tablet by mouth every 4 (four) hours as needed for moderate pain or severe pain (as needed for fibromyalgia and IC).     . hydrOXYzine (ATARAX/VISTARIL) 25 MG tablet Take 1 tablet (25 mg total) by mouth 3 (three) times daily as needed for anxiety or itching. (Patient not taking: Reported on 10/15/2016) 30 tablet 0  . levothyroxine (SYNTHROID) 137 MCG tablet Take 137 mcg by mouth daily.     Marland Kitchen LORazepam (ATIVAN) 2 MG tablet Take 1 tablet by mouth daily as needed.  5  . minoxidil (ROGAINE) 2 % external solution Apply topically 2 (two) times daily.    . Multiple Vitamin (MULTIVITAMIN) capsule Take 1 capsule by mouth daily. Taking since at least 1988.    . NONFORMULARY OR COMPOUNDED ITEM Place 1 suppository vaginally daily.     . ondansetron  (ZOFRAN) 4 MG tablet Take 1 tablet by mouth daily as needed.  3  . pantoprazole (PROTONIX) 40 MG tablet Take 40 mg by mouth daily. Started in 2012.    Vladimir Faster Glycol-Propyl Glycol (SYSTANE OP) Place 1 drop into both eyes as needed (for dry eyes). Has taken since at least 2015.    . prochlorperazine (COMPAZINE) 10 MG tablet Take 1 tablet (10 mg total) by mouth every 6 (six) hours as needed (Nausea or vomiting). (Patient not taking: Reported on 10/15/2016) 30 tablet 1  . terconazole (TERAZOL 7) 0.4 % vaginal cream Place 1 applicator vaginally at bedtime. (Patient not taking: Reported on 10/15/2016) 45 g 0  . triamterene-hydrochlorothiazide (DYAZIDE) 37.5-25 MG capsule Take 1 tablet by mouth daily.     . Vitamin D, Ergocalciferol, (DRISDOL) 50000 units CAPS capsule Take 1 capsule (50,000 Units total) by mouth every 7 (seven) days. 12 capsule 3  . zolpidem (AMBIEN) 10 MG tablet Take 10 mg by mouth at bedtime as needed for sleep.      No current facility-administered medications for this visit.     OBJECTIVE: Middle-aged white woman in no acute distress  Vitals:   12/03/16 1509  BP: 128/75  Pulse: 74  Resp: 18  Temp: 98.1 F (36.7 C)     Body mass index is 25.36 kg/m.    ECOG FS:1 - Symptomatic but completely ambulatory  Sclerae unicteric, EOMs intact Oropharynx clear and moist No cervical or supraclavicular adenopathy Lungs no rales or rhonchi Heart regular rate and rhythm Abd soft, nontender, positive bowel sounds MSK no focal spinal tenderness, no upper extremity lymphedema Neuro: nonfocal, well oriented, appropriate affect Breasts: The right breast is status post lumpectomy and radiation with no evidence of local recurrence. There is still some increased edema in the superior aspect of the breast. This is secondary to the earlier radiation. The left breast is  unremarkable. Both axillae are benign.   Right breast 08/15/2016     LAB RESULTS:  CMP     Component Value  Date/Time   NA 137 11/28/2016 1201   K 3.9 11/28/2016 1201   CL 101 12/15/2015 2044   CO2 27 11/28/2016 1201   GLUCOSE 104 11/28/2016 1201   BUN 9.9 11/28/2016 1201   CREATININE 0.9 11/28/2016 1201   CALCIUM 9.4 11/28/2016 1201   PROT 6.8 11/28/2016 1201   ALBUMIN 3.8 11/28/2016 1201   AST 15 11/28/2016 1201   ALT 10 11/28/2016 1201   ALKPHOS 89 11/28/2016 1201   BILITOT 0.55 11/28/2016 1201   GFRNONAA >60 12/15/2015 2044   GFRAA >60 12/15/2015 2044    INo results found for: SPEP, UPEP  Lab Results  Component Value Date   WBC 7.3 11/28/2016   NEUTROABS 5.3 11/28/2016   HGB 14.2 11/28/2016   HCT 42.5 11/28/2016   MCV 96.1 11/28/2016   PLT 297 11/28/2016      Chemistry      Component Value Date/Time   NA 137 11/28/2016 1201   K 3.9 11/28/2016 1201   CL 101 12/15/2015 2044   CO2 27 11/28/2016 1201   BUN 9.9 11/28/2016 1201   CREATININE 0.9 11/28/2016 1201      Component Value Date/Time   CALCIUM 9.4 11/28/2016 1201   ALKPHOS 89 11/28/2016 1201   AST 15 11/28/2016 1201   ALT 10 11/28/2016 1201   BILITOT 0.55 11/28/2016 1201       No results found for: LABCA2  No components found for: LABCA125  No results for input(s): INR in the last 168 hours.  Urinalysis    Component Value Date/Time   COLORURINE YELLOW 12/15/2015 [place] 12/15/2015 2208   LABSPEC 1.014 12/15/2015 2208   PHURINE 6.5 12/15/2015 2208   GLUCOSEU NEGATIVE 12/15/2015 2208   HGBUR NEGATIVE 12/15/2015 2208   BILIRUBINUR n 10/01/2016 1316   KETONESUR NEGATIVE 12/15/2015 2208   PROTEINUR n 10/01/2016 1316   PROTEINUR NEGATIVE 12/15/2015 2208   UROBILINOGEN 0.2 10/01/2016 1316   NITRITE n 10/01/2016 1316   NITRITE NEGATIVE 12/15/2015 2208   LEUKOCYTESUR Large (3+) (A) 10/01/2016 1316      ELIGIBLE FOR AVAILABLE RESEARCH PROTOCOL: PALLAS, neuropathy study  STUDIES: Ct Chest W Contrast  Result Date: 11/28/2016 CLINICAL DATA:  Patient with history of breast cancer  diagnosed 4/17. Bilateral lumpectomies. Status post chemotherapy and radiation. Follow-up evaluation. EXAM: CT CHEST WITH CONTRAST TECHNIQUE: Multidetector CT imaging of the chest was performed during intravenous contrast administration. CONTRAST:  54m ISOVUE-300 IOPAMIDOL (ISOVUE-300) INJECTION 61% COMPARISON:  Chest CT 09/06/2015. FINDINGS: Cardiovascular: Heart is normal in size. No pericardial effusion. Coronary arterial vascular calcifications. Aorta and main pulmonary artery normal in caliber. Mediastinum/Nodes: Small hiatal hernia. No enlarged axillary, mediastinal or hilar lymphadenopathy. Postsurgical changes right axilla. Lungs/Pleura: Central airways are patent. Dependent atelectasis/scarring within the lower lobes bilaterally. Stable 4 mm left upper lobe pulmonary nodule (image 32; series 5). Innumerable 2-4 mm ground-glass pulmonary nodules are demonstrated throughout the lungs bilaterally. Overall these appear grossly similar in size and morphology when compared to prior exam. No dominant increasing pulmonary nodule identified. No pleural effusion or pneumothorax. Upper Abdomen: Patient status post cholecystectomy. Mild intrahepatic biliary ductal dilatation. Prominent common bile duct measuring 9 mm, likely physiologic post cholecystectomy state. Musculoskeletal: Thoracic spine degenerative changes. No aggressive or acute appearing osseous lesions. Interval postsurgical changes right and left breast. Suggestion of mild fat stranding about the  pancreatic body and tail (image 149; series 2). IMPRESSION: Interval postlumpectomy changes involving the right and left breasts. Interval postsurgical changes involving the right axilla. No residual right axillary adenopathy identified. Re- demonstrated multiple bilateral 2- 4 mm pulmonary nodules, grossly similar in size and configuration when compared to prior exam, nonspecific in etiology. These are likely sequelae of prior infectious/inflammatory process.  Recommend attention on follow-up. Suggestion of mild fat stranding about the pancreatic tail within the left upper quadrant is nonspecific in etiology. Recommend correlation for signs of pancreatitis. Emphysema (ICD10-J43.9). Electronically Signed   By: Lovey Newcomer M.D.   On: 11/28/2016 17:25    She will be due for repeat mammography in July.  ASSESSMENT: 71 y.o. [place] woman status post right breast upper outer quadrant and right axillary lymph node biopsy 08/23/2015, both positive for a clinical T1 N2, stage IIIA invasive ductal carcinoma, grade 2, strongly estrogen receptor positive, 5% progesterone receptor positive, with an MIB-120-30% and no HER-2 amplification  (a) biopsy of a 0.9 cm Right breast satellite nodule 09/13/2015 showed invasive ductal carcinoma, grade 2, E-cadherin strongly positive, with a prognostic panel identical to the larger right upper quadrant mass  (1) left breast biopsy 08/24/2015 shows a complex sclerosing lesion, excised 12/05/2015  (2) neoadjuvant chemotherapy consisting of cyclophosphamide and docetaxel every 21 days 4, with OnPro support, started 09/12/2015, completed 11/14/2015  (3) status post right lumpectomy and sentinel lymph node dissection 12/05/2015 for a residual pT2 pN3, stage IIIC invasive ductal carcinoma, grade 2, with a close anterior margin (skin)  (4) adjuvant radiation 01/18/16 - 11/6/17with capecitabine chemo-sensitization   1) Right breast/nodal areas: 45 Gy in 25 fractions                         2) Right breast boost: 16 Gy in 8 fractions  (5) capecitabine started 04/13/2016, discontinued January 2018 with mild/moderate side effects developing  (6) anastrozole started 06/01/2016  (7) genetics testing (Askenazi descent) 02/07/2016 through the [place] offered by [facility] Hope Pigeon, MD) i found no deleterious mutations in  ATM, BARD1, BRCA1, BRCA2, BRIP1, CDH1, CHEK2, FANCC, MLH1, MSH2, MSH6, NBN, PALB2,  PMS2, PTEN, RAD51C, RAD51D, TP53, and XRCC2.  This panel also includes deletion/duplication analysis (without sequencing) for one gene, EPCAM.  (8) consented to PALLAS trial   (a) randomized to anastrozole alone  PLAN: Alexsia is now a year out from definitive surgery for her breast cancer with no evidence of disease recurrence. This is favorable.  We specifically reviewed the results of the chest CT. Because the nodules have not changed they're almost certainly going to be benign but to establish this will require 1 more CT of the chest a year from now.  It is unfortunate that her hair has never fully common back and the back of her head particularly is noticeably glabrous. She is trying to see Dr. Harlene Salts at [facility] for assistance with that problem.  She is interested in undergoing penicillin sensitivity testing and this will be arranged for November when she returns after her [place] trip  She knows to call for any problems that may develop before her next visit here.     Chauncey Cruel, MD  12/03/2016

## 2016-12-03 ENCOUNTER — Ambulatory Visit (HOSPITAL_BASED_OUTPATIENT_CLINIC_OR_DEPARTMENT_OTHER): Payer: Medicare Other | Admitting: Oncology

## 2016-12-03 ENCOUNTER — Encounter: Payer: Medicare Other | Admitting: *Deleted

## 2016-12-03 ENCOUNTER — Encounter: Payer: Medicare Other | Admitting: Medical Oncology

## 2016-12-03 VITALS — BP 128/75 | HR 74 | Temp 98.1°F | Resp 18 | Ht 62.75 in | Wt 142.0 lb

## 2016-12-03 DIAGNOSIS — Z79811 Long term (current) use of aromatase inhibitors: Secondary | ICD-10-CM | POA: Diagnosis not present

## 2016-12-03 DIAGNOSIS — L658 Other specified nonscarring hair loss: Secondary | ICD-10-CM | POA: Diagnosis not present

## 2016-12-03 DIAGNOSIS — Z170 Estrogen receptor positive status [ER+]: Secondary | ICD-10-CM

## 2016-12-03 DIAGNOSIS — Z9221 Personal history of antineoplastic chemotherapy: Secondary | ICD-10-CM | POA: Diagnosis not present

## 2016-12-03 DIAGNOSIS — C50411 Malignant neoplasm of upper-outer quadrant of right female breast: Secondary | ICD-10-CM

## 2016-12-03 DIAGNOSIS — Z006 Encounter for examination for normal comparison and control in clinical research program: Secondary | ICD-10-CM | POA: Diagnosis not present

## 2016-12-03 DIAGNOSIS — Z923 Personal history of irradiation: Secondary | ICD-10-CM | POA: Diagnosis not present

## 2016-12-03 MED ORDER — PROCHLORPERAZINE MALEATE 10 MG PO TABS: 10.0000 mg | ORAL_TABLET | Freq: Four times a day (QID) | ORAL | 1 refills | Status: DC | PRN

## 2016-12-03 MED ORDER — ANASTROZOLE 1 MG PO TABS: 1.0000 mg | ORAL_TABLET | Freq: Every day | ORAL | 4 refills | Status: DC

## 2016-12-03 MED ORDER — FLUCONAZOLE 100 MG PO TABS: 100.0000 mg | ORAL_TABLET | Freq: Every day | ORAL | 3 refills | Status: DC

## 2016-12-03 MED ORDER — CYCLOSPORINE 0.05 % OP EMUL: 1.0000 [drp] | Freq: Two times a day (BID) | OPHTHALMIC | 3 refills | Status: DC

## 2016-12-03 NOTE — Progress Notes (Signed)
12/03/2016 Patient in to clinic today for follow-up visit. Upon arrival to clinic, questionnaires for PROs and adherence were given to the patient for completion. Patient stated that she was too focused on receiving the results of her CT scan that she could not complete the questionnaires until after she had spoke to her physician. Encouraged patient to complete the questionnaires as soon as she felt able to do so, while understanding the intent was to complete the questionnaires prior to study procedures and discussion with her healthcare provider. Patient returned her completed drug diaries for Cycles 3-4 for anti-hormone therapy, indicating no missed doses. She has one remaining dose (12/04/16) to record on the Cycle 5 diary; a copy of the diary was retained at the site, and the original was returned to the patient for completion, along with a postage-paid, pre-addressed envelope to mail in the completed diary. Patient was given drug diaries for Cycles 6-8 for completion. Patient reports an upcoming trip to [place] for a wedding, followed by a drive back across the country with her sister, to [place], including stops in the [place] as well as [place]. Next follow-up visit is due 02/27/17 with a range of 02/20/17 - 03/06/17, however, patient states that she will be out of the country in [place] for a two-week trip during that time. The next available date for a study-related visit, per Dr. Jana Hakim, is on 03/11/2017. Will notify study staff of this expected discrepancy. Thanked patient for her willingness to participate in this trial. Cindy S. Brigitte Pulse BSN, RN, [place] 12/03/2016 4:26 PM   12/03/2016 Questionnaires completed independently by patient, prior to her departure from the [place]. Cindy S. Brigitte Pulse BSN, RN, [place] 12/03/2016 4:45 PM  12/10/2016 Completed Cycle 5 anti-hormone therapy diary was received in the mail. Cindy S. Brigitte Pulse BSN, RN, [place] 12/10/2016 10:01 AM  Adverse Event Log Study/Protocol:  AFT-05 PALLAS Cycles 3-5: 09/12/16 - 12/03/2016  (cycle end date = 09/11/16) Baseline AEs - improved to grade 1: neuropathy (10/12/16) and fatigue (08/15/16) Event Grade Onset Date Resolved Date Attribution to anastrozole Treatment/ Comments  Vaginal dryness Grade 1 08/01/16 ongoing Yes   Urinary incontinence- stress  Grade 1  08/15/16  ongoing No   Urinary tract infection Grade 2 09/07/16  09/13/16 No Macrobid  Atrophic vaginitis Grade 2 09/13/16 ongoing Yes Vitamin E vaginal suppositories  Dysuria Grade 2 09/13/16 10/12/16 Yes Uribel  Urinary tract infection Grade 2 10/01/16 10/12/16 No Macrobid, Cipro  Weight loss Grade 1 10/12/16 ongoing No   Cindy S. Brigitte Pulse BSN, RN, [place] 12/18/2016 10:19 AM

## 2016-12-05 ENCOUNTER — Ambulatory Visit: Payer: Medicare Other | Admitting: Oncology

## 2016-12-05 ENCOUNTER — Other Ambulatory Visit: Payer: Medicare Other

## 2016-12-17 NOTE — Progress Notes (Signed)
Additional baseline toxicities - ongoing Study/Protocol: AFT-05 PALLAS Event Grade Onset Date Treatment/ Comments  Decreased eGFR 1 08/31/2015   Right breast edema 1 04/05/2016 Due to radiation therapy  Anorexia 1 07/05/2016 Related to intermittent nausea  Cindy S. Brigitte Pulse BSN, RN, CCRP 12/17/2016 1:17 PM

## 2016-12-18 ENCOUNTER — Other Ambulatory Visit: Payer: Self-pay | Admitting: *Deleted

## 2016-12-31 ENCOUNTER — Encounter (HOSPITAL_COMMUNITY): Payer: Self-pay | Admitting: *Deleted

## 2016-12-31 ENCOUNTER — Inpatient Hospital Stay (HOSPITAL_COMMUNITY)
Admission: EM | Admit: 2016-12-31 | Discharge: 2017-01-03 | DRG: 246 | Disposition: A | Discharge status: Home / Self Care | Payer: Medicare Other | Attending: Family Medicine | Admitting: Family Medicine

## 2016-12-31 ENCOUNTER — Other Ambulatory Visit: Payer: Self-pay

## 2016-12-31 ENCOUNTER — Emergency Department (HOSPITAL_COMMUNITY): Payer: Medicare Other

## 2016-12-31 DIAGNOSIS — Z8619 Personal history of other infectious and parasitic diseases: Secondary | ICD-10-CM

## 2016-12-31 DIAGNOSIS — E878 Other disorders of electrolyte and fluid balance, not elsewhere classified: Secondary | ICD-10-CM | POA: Diagnosis present

## 2016-12-31 DIAGNOSIS — Z9221 Personal history of antineoplastic chemotherapy: Secondary | ICD-10-CM

## 2016-12-31 DIAGNOSIS — I214 Non-ST elevation (NSTEMI) myocardial infarction: Principal | ICD-10-CM

## 2016-12-31 DIAGNOSIS — E785 Hyperlipidemia, unspecified: Secondary | ICD-10-CM | POA: Diagnosis present

## 2016-12-31 DIAGNOSIS — G8929 Other chronic pain: Secondary | ICD-10-CM | POA: Diagnosis not present

## 2016-12-31 DIAGNOSIS — R079 Chest pain, unspecified: Secondary | ICD-10-CM | POA: Diagnosis present

## 2016-12-31 DIAGNOSIS — C50411 Malignant neoplasm of upper-outer quadrant of right female breast: Secondary | ICD-10-CM

## 2016-12-31 DIAGNOSIS — Z170 Estrogen receptor positive status [ER+]: Secondary | ICD-10-CM | POA: Diagnosis not present

## 2016-12-31 DIAGNOSIS — I25110 Atherosclerotic heart disease of native coronary artery with unstable angina pectoris: Secondary | ICD-10-CM | POA: Diagnosis present

## 2016-12-31 DIAGNOSIS — K219 Gastro-esophageal reflux disease without esophagitis: Secondary | ICD-10-CM | POA: Diagnosis present

## 2016-12-31 DIAGNOSIS — Z888 Allergy status to other drugs, medicaments and biological substances status: Secondary | ICD-10-CM

## 2016-12-31 DIAGNOSIS — Z923 Personal history of irradiation: Secondary | ICD-10-CM

## 2016-12-31 DIAGNOSIS — L659 Nonscarring hair loss, unspecified: Secondary | ICD-10-CM | POA: Diagnosis present

## 2016-12-31 DIAGNOSIS — I10 Essential (primary) hypertension: Secondary | ICD-10-CM | POA: Diagnosis present

## 2016-12-31 DIAGNOSIS — Z8249 Family history of ischemic heart disease and other diseases of the circulatory system: Secondary | ICD-10-CM

## 2016-12-31 DIAGNOSIS — Z91048 Other nonmedicinal substance allergy status: Secondary | ICD-10-CM

## 2016-12-31 DIAGNOSIS — E039 Hypothyroidism, unspecified: Secondary | ICD-10-CM | POA: Diagnosis present

## 2016-12-31 DIAGNOSIS — M797 Fibromyalgia: Secondary | ICD-10-CM | POA: Diagnosis present

## 2016-12-31 DIAGNOSIS — Z955 Presence of coronary angioplasty implant and graft: Secondary | ICD-10-CM

## 2016-12-31 DIAGNOSIS — I200 Unstable angina: Secondary | ICD-10-CM

## 2016-12-31 DIAGNOSIS — M810 Age-related osteoporosis without current pathological fracture: Secondary | ICD-10-CM | POA: Diagnosis present

## 2016-12-31 DIAGNOSIS — Z87891 Personal history of nicotine dependence: Secondary | ICD-10-CM

## 2016-12-31 DIAGNOSIS — G893 Neoplasm related pain (acute) (chronic): Secondary | ICD-10-CM | POA: Diagnosis present

## 2016-12-31 DIAGNOSIS — F418 Other specified anxiety disorders: Secondary | ICD-10-CM | POA: Diagnosis present

## 2016-12-31 DIAGNOSIS — E871 Hypo-osmolality and hyponatremia: Secondary | ICD-10-CM | POA: Diagnosis not present

## 2016-12-31 DIAGNOSIS — Z8051 Family history of malignant neoplasm of kidney: Secondary | ICD-10-CM

## 2016-12-31 DIAGNOSIS — N3010 Interstitial cystitis (chronic) without hematuria: Secondary | ICD-10-CM | POA: Diagnosis present

## 2016-12-31 DIAGNOSIS — Z803 Family history of malignant neoplasm of breast: Secondary | ICD-10-CM

## 2016-12-31 DIAGNOSIS — Z880 Allergy status to penicillin: Secondary | ICD-10-CM

## 2016-12-31 DIAGNOSIS — Z9049 Acquired absence of other specified parts of digestive tract: Secondary | ICD-10-CM

## 2016-12-31 DIAGNOSIS — R001 Bradycardia, unspecified: Secondary | ICD-10-CM | POA: Diagnosis not present

## 2016-12-31 DIAGNOSIS — Z808 Family history of malignant neoplasm of other organs or systems: Secondary | ICD-10-CM

## 2016-12-31 DIAGNOSIS — Z79811 Long term (current) use of aromatase inhibitors: Secondary | ICD-10-CM

## 2016-12-31 LAB — BASIC METABOLIC PANEL
ANION GAP: 11 (ref 5–15)
BUN: 9 mg/dL (ref 6–20)
CHLORIDE: 96 mmol/L — AB (ref 101–111)
CO2: 26 mmol/L (ref 22–32)
Calcium: 9.2 mg/dL (ref 8.9–10.3)
Creatinine, Ser: 0.74 mg/dL (ref 0.44–1.00)
GFR calc non Af Amer: >60 mL/min (ref >60)
Glucose, Bld: 112 mg/dL — ABNORMAL HIGH (ref 65–99)
POTASSIUM: 3.8 mmol/L (ref 3.5–5.1)
SODIUM: 133 mmol/L — AB (ref 135–145)

## 2016-12-31 LAB — CBC
HEMATOCRIT: 40 % (ref 36.0–46.0)
HEMOGLOBIN: 14.1 g/dL (ref 12.0–15.0)
MCH: 33.1 pg (ref 26.0–34.0)
MCHC: 35.3 g/dL (ref 30.0–36.0)
MCV: 93.9 fL (ref 78.0–100.0)
Platelets: 324 10*3/uL (ref 150–400)
RBC: 4.26 MIL/uL (ref 3.87–5.11)
RDW: 13.6 % (ref 11.5–15.5)
WBC: 7.5 10*3/uL (ref 4.0–10.5)

## 2016-12-31 LAB — D-DIMER, QUANTITATIVE: D-Dimer, Quant: <0.27 ug/mL-FEU (ref 0.00–0.50)

## 2016-12-31 LAB — TROPONIN I: Troponin I: <0.03 ng/mL (ref <0.03)

## 2016-12-31 LAB — POCT I-STAT TROPONIN I: Troponin i, poc: 0 ng/mL (ref 0.00–0.08)

## 2016-12-31 LAB — PROTIME-INR
INR: 0.94
Prothrombin Time: 12.5 seconds (ref 11.4–15.2)

## 2016-12-31 MED ORDER — MORPHINE SULFATE (PF) 4 MG/ML IV SOLN
4.0000 mg | INTRAVENOUS | Status: DC | PRN
  Administered 2016-12-31 – 2017-01-03 (×12): 4 mg via INTRAVENOUS
  Filled 2016-12-31 (×12): qty 1

## 2016-12-31 MED ORDER — ZOLPIDEM TARTRATE 5 MG PO TABS
5.0000 mg | ORAL_TABLET | Freq: Every evening | ORAL | Status: DC | PRN
  Administered 2017-01-01 – 2017-01-03 (×3): 5 mg via ORAL
  Filled 2016-12-31 (×3): qty 1

## 2016-12-31 MED ORDER — ASPIRIN 300 MG RE SUPP: 300.0000 mg | RECTAL | Status: AC

## 2016-12-31 MED ORDER — MORPHINE SULFATE (PF) 4 MG/ML IV SOLN
4.0000 mg | Freq: Once | INTRAVENOUS | Status: AC
  Administered 2016-12-31: 4 mg via INTRAVENOUS
  Filled 2016-12-31: qty 1

## 2016-12-31 MED ORDER — HYDROXYZINE HCL 25 MG PO TABS
25.0000 mg | ORAL_TABLET | Freq: Three times a day (TID) | ORAL | Status: DC | PRN
  Administered 2017-01-02: 25 mg via ORAL
  Filled 2016-12-31: qty 1

## 2016-12-31 MED ORDER — ACETAMINOPHEN 325 MG PO TABS: 650.0000 mg | ORAL_TABLET | ORAL | Status: DC | PRN

## 2016-12-31 MED ORDER — NITROGLYCERIN 0.4 MG SL SUBL
0.4000 mg | SUBLINGUAL_TABLET | SUBLINGUAL | Status: DC | PRN
  Administered 2016-12-31 – 2017-01-01 (×2): 0.4 mg via SUBLINGUAL
  Filled 2016-12-31 (×2): qty 1

## 2016-12-31 MED ORDER — ALUM HYDROXIDE-MAG CARBONATE 95-358 MG/15ML PO SUSP: 30.0000 mL | ORAL | Status: DC | PRN

## 2016-12-31 MED ORDER — HEPARIN (PORCINE) IN NACL 100-0.45 UNIT/ML-% IJ SOLN
800.0000 [IU]/h | INTRAMUSCULAR | Status: DC
  Administered 2016-12-31: 800 [IU]/h via INTRAVENOUS
  Filled 2016-12-31: qty 250

## 2016-12-31 MED ORDER — POLYVINYL ALCOHOL 1.4 % OP SOLN
OPHTHALMIC | Status: DC | PRN
  Filled 2016-12-31: qty 15

## 2016-12-31 MED ORDER — ALUM & MAG HYDROXIDE-SIMETH 200-200-20 MG/5ML PO SUSP
30.0000 mL | ORAL | Status: DC | PRN
  Administered 2016-12-31 – 2017-01-02 (×4): 30 mL via ORAL
  Filled 2016-12-31 (×4): qty 30

## 2016-12-31 MED ORDER — ONDANSETRON HCL 4 MG/2ML IJ SOLN
4.0000 mg | Freq: Four times a day (QID) | INTRAMUSCULAR | Status: DC | PRN
Start: 2016-12-31 — End: 2017-01-01

## 2016-12-31 MED ORDER — HYDROCODONE-ACETAMINOPHEN 10-325 MG PO TABS
1.0000 | ORAL_TABLET | ORAL | Status: DC | PRN
  Administered 2017-01-01 – 2017-01-03 (×10): 1 via ORAL
  Filled 2016-12-31 (×10): qty 1

## 2016-12-31 MED ORDER — ANASTROZOLE 1 MG PO TABS: 1.0000 mg | ORAL_TABLET | Freq: Every day | ORAL | Status: DC

## 2016-12-31 MED ORDER — ASPIRIN EC 81 MG PO TBEC
81.0000 mg | DELAYED_RELEASE_TABLET | Freq: Every day | ORAL | Status: DC
  Administered 2017-01-01 – 2017-01-03 (×3): 81 mg via ORAL
  Filled 2016-12-31 (×5): qty 1

## 2016-12-31 MED ORDER — PANTOPRAZOLE SODIUM 40 MG PO TBEC
40.0000 mg | DELAYED_RELEASE_TABLET | Freq: Every day | ORAL | Status: DC
  Administered 2017-01-01 – 2017-01-03 (×3): 40 mg via ORAL
  Filled 2016-12-31 (×2): qty 1

## 2016-12-31 MED ORDER — ADULT MULTIVITAMIN W/MINERALS CH
1.0000 | ORAL_TABLET | Freq: Every day | ORAL | Status: DC
  Administered 2017-01-01 – 2017-01-03 (×3): 1 via ORAL
  Filled 2016-12-31 (×2): qty 1

## 2016-12-31 MED ORDER — FLUCONAZOLE 100 MG PO TABS
100.0000 mg | ORAL_TABLET | Freq: Every day | ORAL | Status: DC
  Administered 2017-01-01 – 2017-01-03 (×3): 100 mg via ORAL
  Filled 2016-12-31 (×3): qty 1

## 2016-12-31 MED ORDER — DIAZEPAM 5 MG PO TABS
5.0000 mg | ORAL_TABLET | Freq: Four times a day (QID) | ORAL | Status: DC | PRN
  Administered 2017-01-01 – 2017-01-03 (×5): 5 mg via ORAL
  Filled 2016-12-31 (×5): qty 1

## 2016-12-31 MED ORDER — BUPROPION HCL ER (XL) 300 MG PO TB24
300.0000 mg | ORAL_TABLET | Freq: Every day | ORAL | Status: DC
  Administered 2017-01-01 – 2017-01-03 (×3): 300 mg via ORAL
  Filled 2016-12-31: qty 2
  Filled 2016-12-31: qty 1
  Filled 2016-12-31: qty 2
  Filled 2016-12-31: qty 1

## 2016-12-31 MED ORDER — ASPIRIN 81 MG PO CHEW
324.0000 mg | CHEWABLE_TABLET | ORAL | Status: AC
  Administered 2016-12-31: 324 mg via ORAL
  Filled 2016-12-31: qty 4

## 2016-12-31 MED ORDER — HEPARIN BOLUS VIA INFUSION
4000.0000 [IU] | Freq: Once | INTRAVENOUS | Status: AC
  Administered 2016-12-31: 4000 [IU] via INTRAVENOUS
  Filled 2016-12-31: qty 4000

## 2016-12-31 MED ORDER — LEVOTHYROXINE SODIUM 25 MCG PO TABS
137.0000 ug | ORAL_TABLET | Freq: Every day | ORAL | Status: DC
  Administered 2017-01-01 – 2017-01-03 (×3): 137 ug via ORAL
  Filled 2016-12-31 (×3): qty 1

## 2016-12-31 MED ORDER — CYCLOSPORINE 0.05 % OP EMUL
1.0000 [drp] | Freq: Two times a day (BID) | OPHTHALMIC | Status: DC
  Administered 2017-01-01 – 2017-01-03 (×4): 1 [drp] via OPHTHALMIC
  Filled 2016-12-31 (×5): qty 1

## 2016-12-31 MED ORDER — ATORVASTATIN CALCIUM 40 MG PO TABS
40.0000 mg | ORAL_TABLET | Freq: Every day | ORAL | Status: DC
  Administered 2017-01-01 – 2017-01-02 (×2): 40 mg via ORAL
  Filled 2016-12-31 (×2): qty 1

## 2016-12-31 NOTE — Progress Notes (Signed)
ANTICOAGULATION CONSULT NOTE - Initial Consult  Pharmacy Consult for Heparin Indication: chest pain/ACS  Allergies  Allergen Reactions  . Penicillins Other (See Comments)    Has patient had a PCN reaction causing immediate rash, facial/tongue/throat swelling, SOB or lightheadedness with hypotension: No Has patient had a PCN reaction causing severe rash involving mucus membranes or skin necrosis: No Has patient had a PCN reaction that required hospitalization No Has patient had a PCN reaction occurring within the last 10 years: No If all of the above answers are "NO", then may proceed with Cephalosporin use.  . Chlorhexidine Other (See Comments)    Donald IS OK. The CLOTH WIPE causes reaction. Causes itching all over the area  . Other Rash    Adhesive tape    Patient Measurements: Height: 5\' 3"  (160 cm) Weight: 143 lb (64.9 kg) IBW/kg (Calculated) : 52.4 Heparin Dosing Weight: 65kg  Vital Signs: Temp: 97.8 F (36.6 C) (09/03 1818) Temp Source: Oral (09/03 1818) BP: 121/73 (09/03 1924) Pulse Rate: 61 (09/03 1924)  Labs:  Recent Labs  12/31/16 1823  HGB 14.1  HCT 40.0  PLT 324  CREATININE 0.74    Estimated Creatinine Clearance: 57.6 mL/min (by C-G formula based on SCr of 0.74 mg/dL).   Medical History: Past Medical History:  Diagnosis Date  . Anxiety   . Arthritis 2008  . Asthma 1953   age 36  . Cancer [facility])    breast cancer  . Chronic vaginitis 1998  . Fibromyalgia 1982  . GERD (gastroesophageal reflux disease)    since at least 2011  . Hemorrhoids 2009  . History of radiation therapy 01/18/16-03/05/16   right breast/nodal areas 45 Gy, right breast boost 16 Gy  . Hypoglycemia 2003  . Hypothyroidism 1964   age 85  . Interstitial cystitis 1998  . Irritable bowel syndrome 06/14/2009   Qualifier: Diagnosis of  By: Patsy Baltimore RN, Langley Gauss    . Menopausal symptoms   . Osteoporosis 12/24/2006  . Pneumonia 1979   varicella  . PONV (postoperative nausea and  vomiting) 1979    Medications:  Infusions:    Assessment: 71 yo F presents with radiating chest pain unrelieved by aspirin.  CBC WNL. Troponin negative.   Goal of Therapy:  Heparin level 0.3-0.7 units/ml Monitor platelets by anticoagulation protocol: Yes   Plan:  Give 4000 units bolus x 1 Start heparin infusion at 800 units/hr Check anti-Xa level in 8 hours and daily while on heparin Continue to monitor H&H and platelets  Eesha Schmaltz, Lavonia Drafts 12/31/2016,7:57 PM

## 2016-12-31 NOTE — ED Provider Notes (Signed)
[facility] DEPT Provider Note   CSN: [id number] Arrival date & time: 12/31/16  1812     History   Chief Complaint No chief complaint on file.   HPI [name] is a 71 y.o. female.  Patient is a 71 year old femalewith a history of hypothyroidism, prior breast cancer status post radiation and chemotherapy 1 year ago and currently on maintenance therapy, interstitial cystitis and fibromyalgia who presents with chest pain. She states over the last week she's had a burning type feeling of chest pain in the center of her chest which she attributed to possible reflux. She saw her PCP on Friday for these symptoms and he advised to take her Protonix twice daily instead of once daily. She felt like that was helping but this afternoon about 5:30 she was seen in a chair and developed a sharp intense pain to her center chest area. It radiated up to her right jaw and a little bit to her right chest. She had some mild associated shortness of breath nausea and some diaphoresis. She felt like the symptoms were worse with exertion when she was walking around. She took some Maalox without improvement in symptoms. She also took a full strength aspirin which did seem to help her symptoms. She states her pain is much better currently but she still has some residual burning pain in the center of her chest. She denies any history of known heart disease. She takes a blood pressure medicine but she states it's more for fluids and her blood pressure. She also has been told that her cholesterol is mildly elevated.      Past Medical History:  Diagnosis Date  . Anxiety   . Arthritis 2008  . Asthma 1953   age 4  . Cancer [facility])    breast cancer  . Chronic vaginitis 1998  . Fibromyalgia 1982  . GERD (gastroesophageal reflux disease)    since at least 2011  . Hemorrhoids 2009  . History of radiation therapy 01/18/16-03/05/16   right breast/nodal areas 45 Gy, right breast boost 16 Gy  . Hypoglycemia 2003  .  Hypothyroidism 1964   age 44  . Interstitial cystitis 1998  . Irritable bowel syndrome 06/14/2009   Qualifier: Diagnosis of  By: Patsy Baltimore RN, Langley Gauss    . Menopausal symptoms   . Osteoporosis 12/24/2006  . Pneumonia 1979   varicella  . PONV (postoperative nausea and vomiting) 1979    Patient Active Problem List   Diagnosis Date Noted  . Depression with anxiety 12/31/2016  . Chest pain 12/31/2016  . Hyponatremia 12/31/2016  . Chronic pain 12/31/2016  . Essential hypertension 12/31/2016  . Family history of breast cancer in female 02/08/2016  . Rash and nonspecific skin eruption 10/03/2015  . 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 Malignant neoplasm of upper-outer quadrant of right breast in female, estrogen receptor positive ([place]) 09/01/2015  . 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 Asthma 09/12/2011  . Menopausal symptoms   . Chronic vaginitis   . Fibromyalgia   . Interstitial cystitis   . UNSPECIFIED HYPOTHYROIDISM 06/14/2009  . DEPRESSIVE DISORDER NOT ELSEWHERE CLASSIFIED 06/14/2009  . Hereditary and idiopathic peripheral neuropathy 06/14/2009  . UNSPEC HEMORRHOIDS WITHOUT MENTION COMPLICATION 76/19/5093  . ALLERGIC RHINITIS CAUSE UNSPECIFIED 06/14/2009  . ESOPHAGEAL REFLUX 06/14/2009  . Irritable bowel syndrome 06/14/2009  . INSOMNIA UNSPECIFIED 06/14/2009  . Osteoporosis 08/27/2007    Past Surgical History:  Procedure Laterality Date  . BLADDER HYPEREXTENSION  X4, prior to 01/2006  . BLADDER SUSPENSION    . BREAST LUMPECTOMY WITH NEEDLE LOCALIZATION AND AXILLARY LYMPH NODE DISSECTION Right 12/05/2015   Procedure: RIGHT BREAST NEEDLE LOCALIZED (WIRES X 2) LUMPECTOMY WITH AXILLARY LYMPH NODE DISSECTION;  Surgeon: Fanny Skates, MD;  Location: [facility];  Service: General;  Laterality: Right;  . BREAST LUMPECTOMY WITH RADIOACTIVE SEED LOCALIZATION Left 12/05/2015   Procedure: RADIOACTIVE SEED GUIDED LEFT BREAST LUMPECTOMY;  Surgeon: Fanny Skates, MD;  Location: [facility];  Service: General;  Laterality: Left;  . BREAST SURGERY    . BUNIONECTOMY     prior to 01/2006  . CESAREAN SECTION  1979  . CHOLECYSTECTOMY  02/08/2006   DR. INGRAM  . COLONOSCOPY    . DENTAL SURGERY  2009-2016   13 dental implants  . LEG SURGERY  07/06/2004   Broken tibia  . NASAL SINUS SURGERY     X4; prior to 01/2006  . PORT-A-CATH REMOVAL Right 12/05/2015   Procedure: REMOVAL PORT-A-CATH;  Surgeon: Fanny Skates, MD;  Location: [facility];  Service: General;  Laterality: Right;  . PORTACATH PLACEMENT Left 09/08/2015   Procedure: INSERTION OF PORT-A-CATH ;  Surgeon: Fanny Skates, MD;  Location: [facility];  Service: General;  Laterality: Left;    OB History    Gravida Para Term Preterm AB Living   2 2 2     2    SAB TAB Ectopic Multiple Live Births           2       Home Medications    Prior to Admission medications   Medication Sig Start Date End Date Taking? Authorizing Provider  albuterol (PROVENTIL HFA;VENTOLIN HFA) 108 (90 Base) MCG/ACT inhaler Inhale 1-2 puffs into the lungs every 6 (six) hours as needed for wheezing or shortness of breath. Started in 1998    [provider]  anastrozole (ARIMIDEX) 1 MG tablet Take 1 tablet (1 mg total) by mouth daily. 12/03/16   Magrinat, Virgie Dad, MD  buPROPion (WELLBUTRIN XL) 300 MG 24 hr tablet Take 300 mg by mouth daily.     [provider]  Calcium Carbonate-Vitamin D (CALCIUM + D PO) Take 1 tablet by mouth at bedtime. Started approximately 1998    [provider]  carisoprodol (SOMA) 350 MG tablet Take 350 mg by mouth 2 (two) times daily as needed for muscle spasms. Started in 2008.    [provider]  clotrimazole-betamethasone (LOTRISONE) cream Apply 1 application topically 2 (two) times daily. 09/07/16   Megan Salon, MD  cycloSPORINE (RESTASIS) 0.05 % ophthalmic emulsion Place 1 drop into both eyes 2 (two) times daily. 12/03/16   Magrinat, Virgie Dad, MD  diazepam (VALIUM) 5  MG tablet Take 5 mg by mouth every 6 (six) hours as needed for muscle spasms (interstitial cystitis). Started approximately 2008.    [provider]  fluconazole (DIFLUCAN) 100 MG tablet Take 1 tablet (100 mg total) by mouth daily. 12/03/16   Magrinat, Virgie Dad, MD  HYDROcodone-acetaminophen (NORCO) 10-325 MG tablet Take 1 tablet by mouth every 4 (four) hours as needed for moderate pain or severe pain (as needed for fibromyalgia and IC).     [provider]  hydrOXYzine (ATARAX/VISTARIL) 25 MG tablet Take 1 tablet (25 mg total) by mouth 3 (three) times daily as needed for anxiety or itching. Patient not taking: Reported on 10/15/2016 10/10/15   Magrinat, Virgie Dad, MD  levothyroxine (SYNTHROID) 137 MCG tablet Take 137 mcg by mouth daily.  [provider]  LORazepam (ATIVAN) 2 MG tablet Take 1 tablet by mouth daily as needed. 07/31/16   [provider]  minoxidil (ROGAINE) 2 % external solution Apply topically 2 (two) times daily. 07/29/16   [provider]  Multiple Vitamin (MULTIVITAMIN) capsule Take 1 capsule by mouth daily. Taking since at least 1988.    [provider]  NONFORMULARY OR COMPOUNDED ITEM Place 1 suppository vaginally daily. compounded medication - vitamin E 09/21/16   Megan Salon, MD  ondansetron (ZOFRAN) 4 MG tablet Take 1 tablet by mouth daily as needed. 07/31/16   [provider]  pantoprazole (PROTONIX) 40 MG tablet Take 40 mg by mouth daily. Started in 2012.    [provider]  Polyethyl Glycol-Propyl Glycol (SYSTANE OP) Place 1 drop into both eyes as needed (for dry eyes). Has taken since at least 2015.    [provider]  prochlorperazine (COMPAZINE) 10 MG tablet Take 1 tablet (10 mg total) by mouth every 6 (six) hours as needed (Nausea or vomiting). 12/03/16   Magrinat, Virgie Dad, MD  terconazole (TERAZOL 7) 0.4 % vaginal cream Place 1 applicator vaginally at bedtime. Patient not taking: Reported on  10/15/2016 10/01/16   Megan Salon, MD  triamterene-hydrochlorothiazide (DYAZIDE) 37.5-25 MG capsule Take 1 tablet by mouth daily.     [provider]  Vitamin D, Ergocalciferol, (DRISDOL) 50000 units CAPS capsule Take 1 capsule (50,000 Units total) by mouth every 7 (seven) days. 09/07/16   Megan Salon, MD  zolpidem (AMBIEN) 10 MG tablet Take 10 mg by mouth at bedtime as needed for sleep.     [provider]    Family History Family History  Problem Relation Age of Onset  . Hypertension Mother   . Heart disease Mother        d. 48  . Parkinsonism Father        d. 43  . Heart disease Sister   . Breast cancer Sister 8       s/p mastectomy; reportedly negative BRCA1/2 testing approx 7 years ago  . Melanoma Sister        d. 78s; hx of incresed sun exposure  . Cancer Sister        dx. sarcoma of her face in her 68s; treated w/ MOHS surgery  . Parkinsonism Paternal Aunt        d. 90s  . Parkinsonism Paternal Uncle        d. late 61s  . Kidney failure Maternal Grandmother 28  . Heart attack Maternal Grandfather        d. 16s  . Heart disease Paternal Grandmother        d. 57  . Breast cancer Other 50       maternal great aunt (MGF's sister)  . Kidney cancer Paternal Uncle 59       d. 22s; unsure about smoking status    Social History Social History  Substance Use Topics  . Smoking status: Former Smoker    Packs/day: 0.25    Years: 15.00    Types: Cigarettes  . Smokeless tobacco: Never Used     Comment: social smoker/"on and off"  . Alcohol use 0.0 oz/week     Comment: social     Allergies   Penicillins; Chlorhexidine; and Other   Review of Systems Review of Systems  Constitutional: Positive for diaphoresis. Negative for chills and fever.  HENT: Negative for congestion, rhinorrhea and sneezing.   Eyes:  Negative.   Respiratory: Positive for shortness of breath. Negative for cough and chest tightness.   Cardiovascular: Positive for chest pain.  Negative for leg swelling.  Gastrointestinal: Positive for nausea. Negative for abdominal pain, blood in stool, diarrhea and vomiting.  Genitourinary: Negative for difficulty urinating, flank pain, frequency and hematuria.  Musculoskeletal: Negative for arthralgias and back pain.  Skin: Negative for rash.  Neurological: Negative for dizziness, speech difficulty, weakness, numbness and headaches.     Physical Exam Updated Vital Signs BP 121/73 (BP Location: Left Arm)   Pulse 61   Temp 97.8 F (36.6 C) (Oral)   Resp 19   Ht 5' 3"  (1.6 m)   Wt 64.9 kg (143 lb)   LMP 04/30/1996 (Approximate)   SpO2 98%   BMI 25.33 kg/m   Physical Exam  Constitutional: She is oriented to person, place, and time. She appears well-developed and well-nourished.  HENT:  Head: Normocephalic and atraumatic.  Eyes: Pupils are equal, round, and reactive to light.  Neck: Normal range of motion. Neck supple.  Cardiovascular: Normal rate, regular rhythm and normal heart sounds.   Pulmonary/Chest: Effort normal and breath sounds normal. No respiratory distress. She has no wheezes. She has no rales. She exhibits no tenderness.  Abdominal: Soft. Bowel sounds are normal. There is no tenderness. There is no rebound and no guarding.  Musculoskeletal: Normal range of motion. She exhibits no edema.  Lymphadenopathy:    She has no cervical adenopathy.  Neurological: She is alert and oriented to person, place, and time.  Skin: Skin is warm and dry. No rash noted.  Psychiatric: She has a normal mood and affect.     ED Treatments / Results  Labs (all labs ordered are listed, but only abnormal results are displayed) Labs Reviewed  BASIC METABOLIC PANEL - Abnormal; Notable for the following:       Result Value   Sodium 133 (*)    Chloride 96 (*)    Glucose, Bld 112 (*)    All other components within normal limits  CBC  D-DIMER, QUANTITATIVE (NOT AT [facility])  CBC  TROPONIN I  TROPONIN I  TROPONIN I  BASIC  METABOLIC PANEL  I-STAT TROPONIN, ED  POCT I-STAT TROPONIN I    EKG  EKG Interpretation  Date/Time:  Monday December 31 2016 18:44:38 EDT Ventricular Rate:  60 PR Interval:    QRS Duration: 95 QT Interval:  459 QTC Calculation: 459 R Axis:   54 Text Interpretation:  Sinus rhythm Abnormal R-wave progression, early transition Minimal ST depression, diffuse leads Confirmed by Malvin Johns [phone]) on 12/31/2016 7:11:34 PM       Radiology Dg Chest 2 View  Result Date: 12/31/2016 CLINICAL DATA:  Chest pain difficulty breathing EXAM: CHEST  2 VIEW COMPARISON:  12/15/2015, CT chest 11/28/2016 FINDINGS: Hyperinflation. Linear atelectasis or scarring at both lung bases. No consolidation or effusion. Stable cardiomediastinal silhouette. No pneumothorax. Postsurgical changes of both breasts. IMPRESSION: Hyperinflation with linear scarring or atelectasis at the bases. Negative for edema, infiltrate or pneumothorax. Electronically Signed   By: Donavan Foil M.D.   On: 12/31/2016 18:44    Procedures Procedures (including critical care time)  Medications Ordered in ED Medications  nitroGLYCERIN (NITROSTAT) SL tablet 0.4 mg (0.4 mg Sublingual Given 12/31/16 1944)  heparin bolus via infusion 4,000 Units (not administered)  heparin ADULT infusion 100 units/mL (25000 units/229m sodium chloride 0.45%) (not administered)  anastrozole (ARIMIDEX) tablet 1 mg (not administered)  cycloSPORINE (RESTASIS) 0.05 % ophthalmic emulsion  1 drop (not administered)  fluconazole (DIFLUCAN) tablet 100 mg (not administered)  HYDROcodone-acetaminophen (NORCO) 10-325 MG per tablet 1 tablet (not administered)  polyethylene glycol 0.4% and propylene glycol 0.3% (SYSTANE) ophthalmic gel (not administered)  hydrOXYzine (ATARAX/VISTARIL) tablet 25 mg (not administered)  pantoprazole (PROTONIX) EC tablet 40 mg (not administered)  buPROPion (WELLBUTRIN XL) 24 hr tablet 300 mg (not administered)  diazepam (VALIUM) tablet 5  mg (not administered)  levothyroxine (SYNTHROID, LEVOTHROID) tablet 137 mcg (not administered)  multivitamin capsule 1 capsule (not administered)  zolpidem (AMBIEN) tablet 10 mg (not administered)  aspirin chewable tablet 324 mg (not administered)    Or  aspirin suppository 300 mg (not administered)  aspirin EC tablet 81 mg (not administered)  acetaminophen (TYLENOL) tablet 650 mg (not administered)  ondansetron (ZOFRAN) injection 4 mg (not administered)  atorvastatin (LIPITOR) tablet 40 mg (not administered)  morphine 4 MG/ML injection 4 mg (not administered)  morphine 4 MG/ML injection 4 mg (4 mg Intravenous Given 12/31/16 1945)     Initial Impression / Assessment and Plan / ED Course  I have reviewed the triage vital signs and the nursing notes.  Pertinent labs & imaging results that were available during my care of the patient were reviewed by me and considered in my medical decision making (see chart for details).     Patient presents with chest pain. Her pain is improving and almost resolved.she received a full strength aspirin prior to arrival. She does have some subtle ST changes. I spoke with Dr. Sallyanne Kuster who has reviewed the EKG and recommends admission to the hospitalist service to [facility]. Patient was started on heparin. I spoke with Dr. Myna Hidalgo who will admit the pt.  Final Clinical Impressions(s) / ED Diagnoses   Final diagnoses:  Unstable angina [facility])    New Prescriptions New Prescriptions   No medications on file     Malvin Johns, MD 12/31/16 2010

## 2016-12-31 NOTE — ED Triage Notes (Signed)
Pt presents with chest pain, thought it was reflux, today trouble breathing, sudden sharp pain mid sternal. Radiating to rt side and rt jaw

## 2016-12-31 NOTE — ED Notes (Signed)
[facility] dispatch notified for need of transport.  

## 2016-12-31 NOTE — H&P (Signed)
History and Physical    [name] IOE:[id number] DOB: 01-19-1946 DOA: 12/31/2016  PCP: Seward Carol, MD   Patient coming from: Home  Chief Complaint: Chest pain   HPI: [name] is a 71 y.o. female with medical history significant for cancer of the right breast status post lumpectomy and chemoradiation, depression with anxiety, chronic pain, hypertension, hypothyroidism, and insomnia, no presenting to the emergency department for evaluation of chest pain. She reports that she has been suffering from indigestion and some mild dyspnea lately and saw her internist for this. She was started on Protonix with minimal improvement. Shortly prior to her arrival in the ED, she was sitting down and watching TV with her husband when she developed the acute onset of severe sharp pain in the central chest with radiation towards the right axilla and into the right side of her neck. This was different than the indigestion symptoms she had been experiencing lately. She reports that it felt similar to when she had acute cholecystitis, but she is now status post cholecystectomy. There was some mild nausea and an increase in her dyspnea associated with this. No diaphoresis. No recent fevers or chills. No significant cough.   ED Course: Upon arrival to the ED, patient is found to be afebrile, saturating well on room air, with vitals otherwise stable. EKG features a sinus rhythm with anterior ST depressions. Chest x-ray is notable for hyperinflation and linear scarring or atelectasis at the bases, but no edema, infiltrate, or pneumothorax. Chemistry panels notable for a mild hyponatremia and hypochloremia. CBC is unremarkable. D-dimer and troponin are both undetectable. She was treated with morphine and nitroglycerin, experienced a reduction in the pain severity, but continues to have chest pain. Cardiology was consulted by the ED physician and advised for medical admission to [facility] where they will  see the patient in consultation. She was started on heparin infusion in the ED. She continues to have some chest pain, but has remained hemodynamically stable and in no acute distress. She will be observed on the stepdown unit at [facility] for ongoing evaluation and management of chest pain with ST-depression on EKG concerning for ACS.  Review of Systems:  All other systems reviewed and apart from HPI, are negative.  Past Medical History:  Diagnosis Date  . Anxiety   . Arthritis 2008  . Asthma 1953   age 19  . Cancer [facility])    breast cancer  . Chronic vaginitis 1998  . Fibromyalgia 1982  . GERD (gastroesophageal reflux disease)    since at least 2011  . Hemorrhoids 2009  . History of radiation therapy 01/18/16-03/05/16   right breast/nodal areas 45 Gy, right breast boost 16 Gy  . Hypoglycemia 2003  . Hypothyroidism 1964   age 28  . Interstitial cystitis 1998  . Irritable bowel syndrome 06/14/2009   Qualifier: Diagnosis of  By: Patsy Baltimore RN, Langley Gauss    . Menopausal symptoms   . Osteoporosis 12/24/2006  . Pneumonia 1979   varicella  . PONV (postoperative nausea and vomiting) 1979    Past Surgical History:  Procedure Laterality Date  . BLADDER HYPEREXTENSION     X4, prior to 01/2006  . BLADDER SUSPENSION    . BREAST LUMPECTOMY WITH NEEDLE LOCALIZATION AND AXILLARY LYMPH NODE DISSECTION Right 12/05/2015   Procedure: RIGHT BREAST NEEDLE LOCALIZED (WIRES X 2) LUMPECTOMY WITH AXILLARY LYMPH NODE DISSECTION;  Surgeon: Fanny Skates, MD;  Location: [place];  Service: General;  Laterality: Right;  .  BREAST LUMPECTOMY WITH RADIOACTIVE SEED LOCALIZATION Left 12/05/2015   Procedure: RADIOACTIVE SEED GUIDED LEFT BREAST LUMPECTOMY;  Surgeon: Fanny Skates, MD;  Location: [place];  Service: General;  Laterality: Left;  . BREAST SURGERY    . BUNIONECTOMY     prior to 01/2006  . CESAREAN SECTION  1979  . CHOLECYSTECTOMY  02/08/2006   DR. INGRAM  . COLONOSCOPY    . DENTAL SURGERY   2009-2016   13 dental implants  . LEG SURGERY  07/06/2004   Broken tibia  . NASAL SINUS SURGERY     X4; prior to 01/2006  . PORT-A-CATH REMOVAL Right 12/05/2015   Procedure: REMOVAL PORT-A-CATH;  Surgeon: Fanny Skates, MD;  Location: [place];  Service: General;  Laterality: Right;  . PORTACATH PLACEMENT Left 09/08/2015   Procedure: INSERTION OF PORT-A-CATH ;  Surgeon: Fanny Skates, MD;  Location: [place];  Service: General;  Laterality: Left;     reports that she has quit smoking. Her smoking use included Cigarettes. She has a 3.75 pack-year smoking history. She has never used smokeless tobacco. She reports that she drinks alcohol. She reports that she does not use drugs.  Allergies  Allergen Reactions  . Penicillins Other (See Comments)    Has patient had a PCN reaction causing immediate rash, facial/tongue/throat swelling, SOB or lightheadedness with hypotension: No Has patient had a PCN reaction causing severe rash involving mucus membranes or skin necrosis: No Has patient had a PCN reaction that required hospitalization No Has patient had a PCN reaction occurring within the last 10 years: No If all of the above answers are "NO", then may proceed with Cephalosporin use.  . Chlorhexidine Other (See Comments)    [place] IS OK. The CLOTH WIPE causes reaction. Causes itching all over the area  . Other Rash    Adhesive tape    Family History  Problem Relation Age of Onset  . Hypertension Mother   . Heart disease Mother        d. 64  . Parkinsonism Father        d. 26  . Heart disease Sister   . Breast cancer Sister 66       s/p mastectomy; reportedly negative BRCA1/2 testing approx 7 years ago  . Melanoma Sister        d. 60s; hx of incresed sun exposure  . Cancer Sister        dx. sarcoma of her face in her 8s; treated w/ MOHS surgery  . Parkinsonism Paternal Aunt        d. 90s  . Parkinsonism Paternal Uncle        d. late 46s  . Kidney failure Maternal Grandmother 58  .  Heart attack Maternal Grandfather        d. 9s  . Heart disease Paternal Grandmother        d. 64  . Breast cancer Other 6       maternal great aunt (MGF's sister)  . Kidney cancer Paternal Uncle 33       d. 4s; unsure about smoking status     Prior to Admission medications   Medication Sig Start Date End Date Taking? Authorizing Provider  albuterol (PROVENTIL HFA;VENTOLIN HFA) 108 (90 Base) MCG/ACT inhaler Inhale 1-2 puffs into the lungs every 6 (six) hours as needed for wheezing or shortness of breath. Started in 1998    [provider]  anastrozole (ARIMIDEX) 1 MG tablet Take 1 tablet (1 mg total) by mouth  daily. 12/03/16   Magrinat, Virgie Dad, MD  buPROPion (WELLBUTRIN XL) 300 MG 24 hr tablet Take 300 mg by mouth daily.     [provider]  Calcium Carbonate-Vitamin D (CALCIUM + D PO) Take 1 tablet by mouth at bedtime. Started approximately 1998    [provider]  carisoprodol (SOMA) 350 MG tablet Take 350 mg by mouth 2 (two) times daily as needed for muscle spasms. Started in 2008.    [provider]  clotrimazole-betamethasone (LOTRISONE) cream Apply 1 application topically 2 (two) times daily. 09/07/16   Megan Salon, MD  cycloSPORINE (RESTASIS) 0.05 % ophthalmic emulsion Place 1 drop into both eyes 2 (two) times daily. 12/03/16   Magrinat, Virgie Dad, MD  diazepam (VALIUM) 5 MG tablet Take 5 mg by mouth every 6 (six) hours as needed for muscle spasms (interstitial cystitis). Started approximately 2008.    [provider]  fluconazole (DIFLUCAN) 100 MG tablet Take 1 tablet (100 mg total) by mouth daily. 12/03/16   Magrinat, Virgie Dad, MD  HYDROcodone-acetaminophen (NORCO) 10-325 MG tablet Take 1 tablet by mouth every 4 (four) hours as needed for moderate pain or severe pain (as needed for fibromyalgia and IC).     [provider]  hydrOXYzine (ATARAX/VISTARIL) 25 MG tablet Take 1 tablet (25 mg total) by mouth 3 (three) times daily as  needed for anxiety or itching. Patient not taking: Reported on 10/15/2016 10/10/15   Magrinat, Virgie Dad, MD  levothyroxine (SYNTHROID) 137 MCG tablet Take 137 mcg by mouth daily.     [provider]  LORazepam (ATIVAN) 2 MG tablet Take 1 tablet by mouth daily as needed. 07/31/16   [provider]  minoxidil (ROGAINE) 2 % external solution Apply topically 2 (two) times daily. 07/29/16   [provider]  Multiple Vitamin (MULTIVITAMIN) capsule Take 1 capsule by mouth daily. Taking since at least 1988.    [provider]  NONFORMULARY OR COMPOUNDED ITEM Place 1 suppository vaginally daily. compounded medication - vitamin E 09/21/16   Megan Salon, MD  ondansetron (ZOFRAN) 4 MG tablet Take 1 tablet by mouth daily as needed. 07/31/16   [provider]  pantoprazole (PROTONIX) 40 MG tablet Take 40 mg by mouth daily. Started in 2012.    [provider]  Polyethyl Glycol-Propyl Glycol (SYSTANE OP) Place 1 drop into both eyes as needed (for dry eyes). Has taken since at least 2015.    [provider]  prochlorperazine (COMPAZINE) 10 MG tablet Take 1 tablet (10 mg total) by mouth every 6 (six) hours as needed (Nausea or vomiting). 12/03/16   Magrinat, Virgie Dad, MD  terconazole (TERAZOL 7) 0.4 % vaginal cream Place 1 applicator vaginally at bedtime. Patient not taking: Reported on 10/15/2016 10/01/16   Megan Salon, MD  triamterene-hydrochlorothiazide (DYAZIDE) 37.5-25 MG capsule Take 1 tablet by mouth daily.     [provider]  Vitamin D, Ergocalciferol, (DRISDOL) 50000 units CAPS capsule Take 1 capsule (50,000 Units total) by mouth every 7 (seven) days. 09/07/16   Megan Salon, MD  zolpidem (AMBIEN) 10 MG tablet Take 10 mg by mouth at bedtime as needed for sleep.     [provider]    Physical Exam: Vitals:   12/31/16 1818 12/31/16 1819 12/31/16 1924  BP: (!) 144/76  121/73  Pulse: (!) 59  61  Resp: 18  19  Temp: 97.8 F (36.6  C)    TempSrc: Oral    SpO2:  96%  98%  Weight:  64.9 kg (143 lb)   Height:  5' 3"  (1.6 m)       Constitutional: NAD, calm, appears chronically-ill Eyes: PERTLA, lids and conjunctivae normal ENMT: Mucous membranes are moist. Posterior pharynx clear of any exudate or lesions.   Neck: normal, supple, no masses, no thyromegaly Respiratory: slightly diminished bilaterally, no wheezing, no crackles. Normal respiratory effort.   Cardiovascular: S1 & S2 heard, regular rate and rhythm. No extremity edema. 2+ pedal pulses.  Abdomen: No distension, no tenderness, no masses palpated. Bowel sounds normal.  Musculoskeletal: no clubbing / cyanosis. No joint deformity upper and lower extremities.   Skin: no significant rashes, lesions, ulcers. Warm, dry, well-perfused. Neurologic: CN 2-12 grossly intact. Sensation intact, DTR normal. Strength 5/5 in all 4 limbs.  Psychiatric: Alert and oriented x 3. Pleasant and cooperative.     Labs on Admission: I have personally reviewed following labs and imaging studies  CBC:  Recent Labs Lab 12/31/16 1823  WBC 7.5  HGB 14.1  HCT 40.0  MCV 93.9  PLT 803   Basic Metabolic Panel:  Recent Labs Lab 12/31/16 1823  NA 133*  K 3.8  CL 96*  CO2 26  GLUCOSE 112*  BUN 9  CREATININE 0.74  CALCIUM 9.2   GFR: Estimated Creatinine Clearance: 57.6 mL/min (by C-G formula based on SCr of 0.74 mg/dL). Liver Function Tests: No results for input(s): AST, ALT, ALKPHOS, BILITOT, PROT, ALBUMIN in the last 168 hours. No results for input(s): LIPASE, AMYLASE in the last 168 hours. No results for input(s): AMMONIA in the last 168 hours. Coagulation Profile: No results for input(s): INR, PROTIME in the last 168 hours. Cardiac Enzymes: No results for input(s): CKTOTAL, CKMB, CKMBINDEX, TROPONINI in the last 168 hours. BNP (last 3 results) No results for input(s): PROBNP in the last 8760 hours. HbA1C: No results for input(s): HGBA1C in the last 72  hours. CBG: No results for input(s): GLUCAP in the last 168 hours. Lipid Profile: No results for input(s): CHOL, HDL, LDLCALC, TRIG, CHOLHDL, LDLDIRECT in the last 72 hours. Thyroid Function Tests: No results for input(s): TSH, T4TOTAL, FREET4, T3FREE, THYROIDAB in the last 72 hours. Anemia Panel: No results for input(s): VITAMINB12, FOLATE, FERRITIN, TIBC, IRON, RETICCTPCT in the last 72 hours. Urine analysis:    Component Value Date/Time   COLORURINE YELLOW 12/15/2015 2208   APPEARANCEUR CLEAR 12/15/2015 2208   LABSPEC 1.014 12/15/2015 2208   PHURINE 6.5 12/15/2015 2208   GLUCOSEU NEGATIVE 12/15/2015 2208   HGBUR NEGATIVE 12/15/2015 2208   BILIRUBINUR n 10/01/2016 1316   KETONESUR NEGATIVE 12/15/2015 2208   PROTEINUR n 10/01/2016 1316   PROTEINUR NEGATIVE 12/15/2015 2208   UROBILINOGEN 0.2 10/01/2016 1316   NITRITE n 10/01/2016 1316   NITRITE NEGATIVE 12/15/2015 2208   LEUKOCYTESUR Large (3+) (A) 10/01/2016 1316   Sepsis Labs: @LABRCNTIP (procalcitonin:4,lacticidven:4) )No results found for this or any previous visit (from the past 240 hour(s)).   Radiological Exams on Admission: Dg Chest 2 View  Result Date: 12/31/2016 CLINICAL DATA:  Chest pain difficulty breathing EXAM: CHEST  2 VIEW COMPARISON:  12/15/2015, CT chest 11/28/2016 FINDINGS: Hyperinflation. Linear atelectasis or scarring at both lung bases. No consolidation or effusion. Stable cardiomediastinal silhouette. No pneumothorax. Postsurgical changes of both breasts. IMPRESSION: Hyperinflation with linear scarring or atelectasis at the bases. Negative for edema, infiltrate or pneumothorax. Electronically Signed   By: Donavan Foil M.D.   On: 12/31/2016 18:44    EKG: Independently reviewed. Sinus rhythm, anterior  ST-depressions, similar to prior.   Assessment/Plan  1. Chest pain - Pt presents with acute severe chest pain  - Had been experiencing indigestion and heartburn for a few days leading up to this, but  current pain different  - D-dimer and initial troponin both undetectable in ED - EKG with ST-depression in anterior leads, similar to prior  - Improved with morphine and NTG in ED, but continues to have pain - She was started on heparin infusion in ED  - Cardiology is consulting and much appreciated, advised medical admission to [facility] - Plan to continue cardiac monitoring, obtain serial troponin measurements, repeat EKG, give ASA 324, continue prn NTG, start statin, no beta blocker d/t low BP and HR   2. Breast cancer, right - Status-post lumpectomy and chemoradiation  - Continues to follow with oncology and is managed with Arimidex  - Given concern for ACS, Arimidex held on admission    3. Hypertension  - BP at goal  - HCTZ held in light of hyponatremia  - Monitor and treat as needed with hydralazine IVP's    4. Chronic pain  - Cancer-related  - Continue home regimen with prn Norco, prn Valuim for spasms   5. Hyponatremia  - Appears roughly euvolemic on admission  - Hold HCTZ, repeat chem panel in am   6. Depression, anxiety  - Stable  - Continue Wellbutrin, prn Vistaril, prn Valium    7. Hypothyroidism  - Continue Synthroid     DVT prophylaxis: IV heparin infusion per ACS dosing  Code Status: Full  Family Communication: Discussed with patient Disposition Plan: Observe in SDU at [facility] Consults called: Cardiology Admission status: Observation    Vianne Bulls, MD [facility] Pager [phone]  If 7PM-7AM, please contact night-coverage [email or web address] Password [email or web address]  12/31/2016, 8:07 PM

## 2016-12-31 NOTE — ED Notes (Signed)
Report to [facility]

## 2017-01-01 ENCOUNTER — Other Ambulatory Visit: Payer: Self-pay

## 2017-01-01 ENCOUNTER — Encounter (HOSPITAL_COMMUNITY)
Admission: EM | Disposition: A | Discharge status: Home / Self Care | Payer: Self-pay | Source: Home / Self Care | Attending: Family Medicine

## 2017-01-01 ENCOUNTER — Encounter (HOSPITAL_COMMUNITY): Payer: Self-pay

## 2017-01-01 DIAGNOSIS — Z87891 Personal history of nicotine dependence: Secondary | ICD-10-CM | POA: Diagnosis not present

## 2017-01-01 DIAGNOSIS — Z8051 Family history of malignant neoplasm of kidney: Secondary | ICD-10-CM | POA: Diagnosis not present

## 2017-01-01 DIAGNOSIS — I25110 Atherosclerotic heart disease of native coronary artery with unstable angina pectoris: Secondary | ICD-10-CM

## 2017-01-01 DIAGNOSIS — Z170 Estrogen receptor positive status [ER+]: Secondary | ICD-10-CM | POA: Diagnosis not present

## 2017-01-01 DIAGNOSIS — Z923 Personal history of irradiation: Secondary | ICD-10-CM | POA: Diagnosis not present

## 2017-01-01 DIAGNOSIS — M797 Fibromyalgia: Secondary | ICD-10-CM | POA: Diagnosis present

## 2017-01-01 DIAGNOSIS — Z888 Allergy status to other drugs, medicaments and biological substances status: Secondary | ICD-10-CM | POA: Diagnosis not present

## 2017-01-01 DIAGNOSIS — G8929 Other chronic pain: Secondary | ICD-10-CM | POA: Diagnosis not present

## 2017-01-01 DIAGNOSIS — R079 Chest pain, unspecified: Secondary | ICD-10-CM | POA: Diagnosis not present

## 2017-01-01 DIAGNOSIS — N3010 Interstitial cystitis (chronic) without hematuria: Secondary | ICD-10-CM | POA: Diagnosis present

## 2017-01-01 DIAGNOSIS — E871 Hypo-osmolality and hyponatremia: Secondary | ICD-10-CM | POA: Diagnosis present

## 2017-01-01 DIAGNOSIS — Z9221 Personal history of antineoplastic chemotherapy: Secondary | ICD-10-CM | POA: Diagnosis not present

## 2017-01-01 DIAGNOSIS — I200 Unstable angina: Secondary | ICD-10-CM | POA: Diagnosis present

## 2017-01-01 DIAGNOSIS — I10 Essential (primary) hypertension: Secondary | ICD-10-CM | POA: Diagnosis present

## 2017-01-01 DIAGNOSIS — Z803 Family history of malignant neoplasm of breast: Secondary | ICD-10-CM | POA: Diagnosis not present

## 2017-01-01 DIAGNOSIS — Z8619 Personal history of other infectious and parasitic diseases: Secondary | ICD-10-CM | POA: Diagnosis not present

## 2017-01-01 DIAGNOSIS — E785 Hyperlipidemia, unspecified: Secondary | ICD-10-CM | POA: Diagnosis present

## 2017-01-01 DIAGNOSIS — C50411 Malignant neoplasm of upper-outer quadrant of right female breast: Secondary | ICD-10-CM | POA: Diagnosis present

## 2017-01-01 DIAGNOSIS — F418 Other specified anxiety disorders: Secondary | ICD-10-CM | POA: Diagnosis present

## 2017-01-01 DIAGNOSIS — Z9049 Acquired absence of other specified parts of digestive tract: Secondary | ICD-10-CM | POA: Diagnosis not present

## 2017-01-01 DIAGNOSIS — Z955 Presence of coronary angioplasty implant and graft: Secondary | ICD-10-CM | POA: Diagnosis not present

## 2017-01-01 DIAGNOSIS — E878 Other disorders of electrolyte and fluid balance, not elsewhere classified: Secondary | ICD-10-CM | POA: Diagnosis present

## 2017-01-01 DIAGNOSIS — I214 Non-ST elevation (NSTEMI) myocardial infarction: Secondary | ICD-10-CM

## 2017-01-01 DIAGNOSIS — Z808 Family history of malignant neoplasm of other organs or systems: Secondary | ICD-10-CM | POA: Diagnosis not present

## 2017-01-01 DIAGNOSIS — E039 Hypothyroidism, unspecified: Secondary | ICD-10-CM | POA: Diagnosis present

## 2017-01-01 DIAGNOSIS — Z8249 Family history of ischemic heart disease and other diseases of the circulatory system: Secondary | ICD-10-CM | POA: Diagnosis not present

## 2017-01-01 DIAGNOSIS — Z91048 Other nonmedicinal substance allergy status: Secondary | ICD-10-CM | POA: Diagnosis not present

## 2017-01-01 DIAGNOSIS — Z880 Allergy status to penicillin: Secondary | ICD-10-CM | POA: Diagnosis not present

## 2017-01-01 DIAGNOSIS — R072 Precordial pain: Secondary | ICD-10-CM | POA: Diagnosis not present

## 2017-01-01 HISTORY — PX: LEFT HEART CATH AND CORONARY ANGIOGRAPHY: CATH118249

## 2017-01-01 HISTORY — PX: CORONARY STENT INTERVENTION: CATH118234

## 2017-01-01 LAB — URINALYSIS, ROUTINE W REFLEX MICROSCOPIC
BACTERIA UA: NONE SEEN
BILIRUBIN URINE: NEGATIVE
Glucose, UA: NEGATIVE mg/dL
Hgb urine dipstick: NEGATIVE
Ketones, ur: NEGATIVE mg/dL
NITRITE: NEGATIVE
Protein, ur: NEGATIVE mg/dL
SPECIFIC GRAVITY, URINE: 1.042 — AB (ref 1.005–1.030)
SQUAMOUS EPITHELIAL / LPF: NONE SEEN
pH: 6 (ref 5.0–8.0)

## 2017-01-01 LAB — HEPARIN LEVEL (UNFRACTIONATED)
HEPARIN UNFRACTIONATED: 0.63 [IU]/mL (ref 0.30–0.70)
HEPARIN UNFRACTIONATED: 0.35 [IU]/mL (ref 0.30–0.70)

## 2017-01-01 LAB — CBC
HCT: 39.6 % (ref 36.0–46.0)
HEMOGLOBIN: 13.4 g/dL (ref 12.0–15.0)
MCH: 32.3 pg (ref 26.0–34.0)
MCHC: 33.8 g/dL (ref 30.0–36.0)
MCV: 95.4 fL (ref 78.0–100.0)
PLATELETS: 293 10*3/uL (ref 150–400)
RBC: 4.15 MIL/uL (ref 3.87–5.11)
RDW: 13.5 % (ref 11.5–15.5)
WBC: 9.7 10*3/uL (ref 4.0–10.5)

## 2017-01-01 LAB — POCT ACTIVATED CLOTTING TIME: Activated Clotting Time: 428 seconds

## 2017-01-01 LAB — TROPONIN I
TROPONIN I: 0.44 ng/mL — AB (ref <0.03)
Troponin I: 0.29 ng/mL (ref <0.03)

## 2017-01-01 LAB — MRSA PCR SCREENING: MRSA BY PCR: NEGATIVE

## 2017-01-01 SURGERY — LEFT HEART CATH AND CORONARY ANGIOGRAPHY
Anesthesia: LOCAL

## 2017-01-01 MED ORDER — TICAGRELOR 90 MG PO TABS
ORAL_TABLET | ORAL | Status: DC | PRN
  Administered 2017-01-01: 180 mg via ORAL

## 2017-01-01 MED ORDER — MIDAZOLAM HCL 2 MG/2ML IJ SOLN
INTRAMUSCULAR | Status: AC
  Filled 2017-01-01: qty 2

## 2017-01-01 MED ORDER — SODIUM CHLORIDE 0.9 % IV SOLN: 250.0000 mL | INTRAVENOUS | Status: DC | PRN

## 2017-01-01 MED ORDER — VERAPAMIL HCL 2.5 MG/ML IV SOLN
INTRAVENOUS | Status: DC | PRN
  Administered 2017-01-01: 2 mg via INTRAVENOUS

## 2017-01-01 MED ORDER — HEPARIN SODIUM (PORCINE) 1000 UNIT/ML IJ SOLN
INTRAMUSCULAR | Status: AC
  Filled 2017-01-01: qty 1

## 2017-01-01 MED ORDER — SODIUM CHLORIDE 0.9% FLUSH
3.0000 mL | Freq: Two times a day (BID) | INTRAVENOUS | Status: DC
  Administered 2017-01-01 – 2017-01-03 (×3): 3 mL via INTRAVENOUS

## 2017-01-01 MED ORDER — MIDAZOLAM HCL 2 MG/2ML IJ SOLN
INTRAMUSCULAR | Status: DC | PRN
  Administered 2017-01-01 (×3): 1 mg via INTRAVENOUS
  Administered 2017-01-01: 2 mg via INTRAVENOUS
  Administered 2017-01-01 (×2): 1 mg via INTRAVENOUS

## 2017-01-01 MED ORDER — SODIUM CHLORIDE 0.9% FLUSH: 3.0000 mL | INTRAVENOUS | Status: DC | PRN

## 2017-01-01 MED ORDER — FENTANYL CITRATE (PF) 100 MCG/2ML IJ SOLN
INTRAMUSCULAR | Status: DC | PRN
  Administered 2017-01-01 (×3): 25 ug via INTRAVENOUS
  Administered 2017-01-01: 50 ug via INTRAVENOUS
  Administered 2017-01-01: 25 ug via INTRAVENOUS
  Administered 2017-01-01: 50 ug via INTRAVENOUS
  Administered 2017-01-01 (×2): 25 ug via INTRAVENOUS

## 2017-01-01 MED ORDER — HEPARIN (PORCINE) IN NACL 2-0.9 UNIT/ML-% IJ SOLN
INTRAMUSCULAR | Status: AC | PRN
  Administered 2017-01-01: 1000 mL

## 2017-01-01 MED ORDER — IOPAMIDOL (ISOVUE-370) INJECTION 76%
INTRAVENOUS | Status: DC | PRN
  Administered 2017-01-01: 180 mL via INTRA_ARTERIAL

## 2017-01-01 MED ORDER — VERAPAMIL HCL 2.5 MG/ML IV SOLN
INTRAVENOUS | Status: AC
  Filled 2017-01-01: qty 2

## 2017-01-01 MED ORDER — LIDOCAINE HCL (PF) 1 % IJ SOLN
INTRAMUSCULAR | Status: AC
  Filled 2017-01-01: qty 30

## 2017-01-01 MED ORDER — HEPARIN (PORCINE) IN NACL 2-0.9 UNIT/ML-% IJ SOLN
INTRAMUSCULAR | Status: AC
  Filled 2017-01-01: qty 1000

## 2017-01-01 MED ORDER — ONDANSETRON HCL 4 MG/2ML IJ SOLN: 4.0000 mg | Freq: Four times a day (QID) | INTRAMUSCULAR | Status: DC | PRN

## 2017-01-01 MED ORDER — SODIUM CHLORIDE 0.9% FLUSH
3.0000 mL | Freq: Two times a day (BID) | INTRAVENOUS | Status: DC
  Administered 2017-01-01: 3 mL via INTRAVENOUS

## 2017-01-01 MED ORDER — TICAGRELOR 90 MG PO TABS
90.0000 mg | ORAL_TABLET | Freq: Two times a day (BID) | ORAL | Status: DC
Start: 2017-01-02 — End: 2017-01-03
  Administered 2017-01-02 – 2017-01-03 (×3): 90 mg via ORAL
  Filled 2017-01-01 (×4): qty 1

## 2017-01-01 MED ORDER — SODIUM CHLORIDE 0.9 % WEIGHT BASED INFUSION: 3.0000 mL/kg/h | INTRAVENOUS | Status: DC

## 2017-01-01 MED ORDER — VERAPAMIL HCL 2.5 MG/ML IV SOLN
INTRAVENOUS | Status: DC | PRN
  Administered 2017-01-01: 10 mL via INTRA_ARTERIAL

## 2017-01-01 MED ORDER — SODIUM CHLORIDE 0.9 % IV SOLN: 1.7500 mg/kg/h | INTRAVENOUS | Status: AC

## 2017-01-01 MED ORDER — ACETAMINOPHEN 325 MG PO TABS: 650.0000 mg | ORAL_TABLET | ORAL | Status: DC | PRN

## 2017-01-01 MED ORDER — NITROGLYCERIN 1 MG/10 ML FOR IR/CATH LAB
INTRA_ARTERIAL | Status: AC
  Filled 2017-01-01: qty 10

## 2017-01-01 MED ORDER — BIVALIRUDIN TRIFLUOROACETATE 250 MG IV SOLR
INTRAVENOUS | Status: AC
  Filled 2017-01-01: qty 250

## 2017-01-01 MED ORDER — TICAGRELOR 90 MG PO TABS
ORAL_TABLET | ORAL | Status: AC
  Filled 2017-01-01: qty 2

## 2017-01-01 MED ORDER — ASPIRIN 81 MG PO CHEW: 81.0000 mg | CHEWABLE_TABLET | ORAL | Status: DC

## 2017-01-01 MED ORDER — BIVALIRUDIN BOLUS VIA INFUSION - CUPID
INTRAVENOUS | Status: DC | PRN
  Administered 2017-01-01: 50.475 mg via INTRAVENOUS

## 2017-01-01 MED ORDER — NITROGLYCERIN 1 MG/10 ML FOR IR/CATH LAB
INTRA_ARTERIAL | Status: DC | PRN
  Administered 2017-01-01: 200 ug via INTRACORONARY
  Administered 2017-01-01: 200 ug via INTRA_ARTERIAL
  Administered 2017-01-01: 200 ug via INTRACORONARY

## 2017-01-01 MED ORDER — SODIUM CHLORIDE 0.9 % IV SOLN
INTRAVENOUS | Status: AC
  Administered 2017-01-01: 18:00:00 via INTRAVENOUS

## 2017-01-01 MED ORDER — SODIUM CHLORIDE 0.9 % IV SOLN
INTRAVENOUS | Status: AC | PRN
  Administered 2017-01-01 (×2): 1.75 mg/kg/h via INTRAVENOUS

## 2017-01-01 MED ORDER — IOPAMIDOL (ISOVUE-370) INJECTION 76%
INTRAVENOUS | Status: AC
  Filled 2017-01-01: qty 100

## 2017-01-01 MED ORDER — SODIUM CHLORIDE 0.9 % WEIGHT BASED INFUSION
1.0000 mL/kg/h | INTRAVENOUS | Status: DC
  Administered 2017-01-01: 250 mL via INTRAVENOUS

## 2017-01-01 MED ORDER — LIDOCAINE HCL (PF) 1 % IJ SOLN
INTRAMUSCULAR | Status: DC | PRN
  Administered 2017-01-01: 2 mL

## 2017-01-01 MED ORDER — FENTANYL CITRATE (PF) 100 MCG/2ML IJ SOLN
INTRAMUSCULAR | Status: AC
  Filled 2017-01-01: qty 2

## 2017-01-01 MED ORDER — ASPIRIN 81 MG PO CHEW: 81.0000 mg | CHEWABLE_TABLET | Freq: Every day | ORAL | Status: DC

## 2017-01-01 MED ORDER — HYDRALAZINE HCL 20 MG/ML IJ SOLN: 5.0000 mg | INTRAMUSCULAR | Status: AC | PRN

## 2017-01-01 MED ORDER — LABETALOL HCL 5 MG/ML IV SOLN: 10.0000 mg | INTRAVENOUS | Status: AC | PRN

## 2017-01-01 MED ORDER — HEPARIN SODIUM (PORCINE) 1000 UNIT/ML IJ SOLN
INTRAMUSCULAR | Status: DC | PRN
  Administered 2017-01-01: 3000 [IU] via INTRAVENOUS

## 2017-01-01 MED ORDER — SODIUM CHLORIDE 0.9 % IV SOLN
250.0000 mL | INTRAVENOUS | Status: DC | PRN
Start: 2017-01-01 — End: 2017-01-01

## 2017-01-01 SURGICAL SUPPLY — 32 items
BALLN EMERGE MR 2.5X8 (BALLOONS) ×2
BALLN SAPPHIRE 2.5X12 (BALLOONS) ×2
BALLN ~~LOC~~ EMERGE MR 3.0X8 (BALLOONS) ×2
BALLN ~~LOC~~ EUPHORA RX 3.0X12 (BALLOONS) ×2
BALLN ~~LOC~~ EUPHORA RX 3.25X15 (BALLOONS) ×2
BALLOON EMERGE MR 2.5X8 (BALLOONS) IMPLANT
BALLOON SAPPHIRE 2.5X12 (BALLOONS) ×1 IMPLANT
BALLOON ~~LOC~~ EMERGE MR 3.0X8 (BALLOONS) ×1 IMPLANT
BALLOON ~~LOC~~ EUPHORA RX 3.0X12 (BALLOONS) IMPLANT
BALLOON ~~LOC~~ EUPHORA RX 3.25X15 (BALLOONS) IMPLANT
CATH INFINITI 5 FR JL3.5 (CATHETERS) ×1 IMPLANT
CATH INFINITI JR4 5F (CATHETERS) ×1 IMPLANT
CATH LAUNCHER 6FR EBU 3 (CATHETERS) ×2 IMPLANT
CATH LAUNCHER 6FR JR4 (CATHETERS) ×1 IMPLANT
COVER PRB 48X5XTLSCP FOLD TPE (BAG) IMPLANT
COVER PROBE 5X48 (BAG) ×2
DEVICE RAD TR BAND REGULAR (VASCULAR PRODUCTS) ×1 IMPLANT
GLIDESHEATH SLEND SS 6F .021 (SHEATH) ×1 IMPLANT
GUIDEWIRE INQWIRE 1.5J.035X260 (WIRE) IMPLANT
INQWIRE 1.5J .035X260CM (WIRE) ×2
KIT ENCORE 26 ADVANTAGE (KITS) ×1 IMPLANT
KIT HEART LEFT (KITS) ×2 IMPLANT
PACK CARDIAC CATHETERIZATION (CUSTOM PROCEDURE TRAY) ×2 IMPLANT
STENT PROMUS PREM MR 2.5X12 (Permanent Stent) ×1 IMPLANT
STENT PROMUS PREM MR 2.75X12 (Permanent Stent) ×1 IMPLANT
STENT PROMUS PREM MR 2.75X16 (Permanent Stent) ×1 IMPLANT
STENT PROMUS PREM MR 3.0X20 (Permanent Stent) ×1 IMPLANT
TRANSDUCER W/STOPCOCK (MISCELLANEOUS) ×2 IMPLANT
TUBING CIL FLEX 10 FLL-RA (TUBING) ×2 IMPLANT
VALVE GUARDIAN II ~~LOC~~ HEMO (MISCELLANEOUS) ×1 IMPLANT
WIRE ASAHI PROWATER 180CM (WIRE) ×1 IMPLANT
WIRE HI TORQ BMW 190CM (WIRE) ×1 IMPLANT

## 2017-01-01 NOTE — Plan of Care (Signed)
Problem: Nutrition: Goal: Adequate nutrition will be maintained Outcome: Not Progressing Currently NPO  Comments: Admitted to SDU. Chorhexidine contraindicated, sensitivity to it. Went over floor routine, labs, medications and general admission questions.

## 2017-01-01 NOTE — Consult Note (Signed)
Cardiology Consult    Patient ID: KEAJAH KILLOUGH MRN: [id number], DOB/AGE: [birth year]   Admit date: 12/31/2016 Date of Consult: 01/01/2017  Primary Physician: Seward Carol, MD Primary Cardiologist: New Requesting Provider: Bonner Puna Reason for Consultation: chest pain, + trop  [name] is a 71 y.o. female who is being seen today for the evaluation of chest pain at the request of Dr. Bonner Puna.   Patient Profile    71 year old female with past medical history of breast CA s/p lumpectomy and chemotherapy/radiation, anxiety, fibromyalgia, hypertension, hypothyroidism and insomnia who presented to the [facility] ED with chest pain.  Past Medical History   Past Medical History:  Diagnosis Date  . Anxiety   . Arthritis 2008  . Asthma 1953   age 56  . Cancer [facility])    breast cancer  . Chronic vaginitis 1998  . Fibromyalgia 1982  . GERD (gastroesophageal reflux disease)    since at least 2011  . Hemorrhoids 2009  . History of radiation therapy 01/18/16-03/05/16   right breast/nodal areas 45 Gy, right breast boost 16 Gy  . Hypoglycemia 2003  . Hypothyroidism 1964   age 55  . Interstitial cystitis 1998  . Irritable bowel syndrome 06/14/2009   Qualifier: Diagnosis of  By: Patsy Baltimore RN, Langley Gauss    . Menopausal symptoms   . Osteoporosis 12/24/2006  . Pneumonia 1979   varicella  . PONV (postoperative nausea and vomiting) 1979    Past Surgical History:  Procedure Laterality Date  . BLADDER HYPEREXTENSION     X4, prior to 01/2006  . BLADDER SUSPENSION    . BREAST LUMPECTOMY WITH NEEDLE LOCALIZATION AND AXILLARY LYMPH NODE DISSECTION Right 12/05/2015   Procedure: RIGHT BREAST NEEDLE LOCALIZED (WIRES X 2) LUMPECTOMY WITH AXILLARY LYMPH NODE DISSECTION;  Surgeon: Fanny Skates, MD;  Location: [place];  Service: General;  Laterality: Right;  . BREAST LUMPECTOMY WITH RADIOACTIVE SEED LOCALIZATION Left 12/05/2015   Procedure: RADIOACTIVE SEED GUIDED LEFT BREAST LUMPECTOMY;  Surgeon: Fanny Skates, MD;  Location: [place];  Service: General;  Laterality: Left;  . BREAST SURGERY    . BUNIONECTOMY     prior to 01/2006  . CESAREAN SECTION  1979  . CHOLECYSTECTOMY  02/08/2006   DR. INGRAM  . COLONOSCOPY    . DENTAL SURGERY  2009-2016   13 dental implants  . LEG SURGERY  07/06/2004   Broken tibia  . NASAL SINUS SURGERY     X4; prior to 01/2006  . PORT-A-CATH REMOVAL Right 12/05/2015   Procedure: REMOVAL PORT-A-CATH;  Surgeon: Fanny Skates, MD;  Location: [place];  Service: General;  Laterality: Right;  . PORTACATH PLACEMENT Left 09/08/2015   Procedure: INSERTION OF PORT-A-CATH ;  Surgeon: Fanny Skates, MD;  Location: [place];  Service: General;  Laterality: Left;     Allergies  Allergies  Allergen Reactions  . Penicillins Other (See Comments)    Has patient had a PCN reaction causing immediate rash, facial/tongue/throat swelling, SOB or lightheadedness with hypotension: No Has patient had a PCN reaction causing severe rash involving mucus membranes or skin necrosis: No Has patient had a PCN reaction that required hospitalization No Has patient had a PCN reaction occurring within the last 10 years: No If all of the above answers are "NO", then may proceed with Cephalosporin use.  . Chlorhexidine Other (See Comments)    [place] IS OK. The CLOTH WIPE causes reaction. Causes itching all over the area  . Other Rash    Adhesive tape  History of Present Illness    Mrs. Digilio is a 71 year old female with past medical history of breast CA s/p lumpectomy and chemotherapy/radiation, anxiety, fibromyalgia, hypertension, hypothyroidism and insomnia.  She recently underwent chemotherapy/radiation and lumpectomy on the right side last year after being diagnosed with breast CA. She is followed by Dr. Jana Hakim at the [facility], and is currently enrolled in a clinical trial. She denies any previous history of CAD. Reports strong family history with mother having bypass in her  49s, and sister having multiple stents in her 17s. Has never seen a cardiologist in the past, and reports her blood pressure is normally well controlled.  States she was just seen by her oncologist and given a good report. Underwent CT chest with contrast on 11/28/16 which noted coronary artery calcifications, with stable lung nodules. She was plan for follow-up in November with her oncologist and a CT chest in one year. States about a week ago she began to experience what she thought her GI symptoms. Reported indigestion and some mild dyspnea, and saw her PCP for this. Protonix was increased to twice a day with some improvement, and she used her inhaler with improvement of her dyspnea. States she was sitting at home yesterday watchinh TV when she experienced a sudden onset of severe sharp centralized chest pain with radiation into the right side of her neck and shoulder. States this was very different than her indigestion symptoms. Attempted antacids without relief. Her symptoms persisted and her husband brought her to [facility] for further evaluation.   In the ED her labs showed mild hyponatremia, other electrolytes stable, troponin 0.03, hemoglobin 14.1, d-dimer negative. EKG showed normal sinus rhythm with no acute ST/T-wave abnormalities. She was given sublingual nitroglycerin and IV morphine with resolution of her pain. Given her symptoms and family history cardiology was consult and recommended starting IV heparin with transfer to call for further workup. Subsequent troponin showed 0.4>> 0.29.   Inpatient Medications    . aspirin EC  81 mg Oral Daily  . atorvastatin  40 mg Oral q1800  . buPROPion  300 mg Oral Daily  . cycloSPORINE  1 drop Both Eyes BID  . fluconazole  100 mg Oral Daily  . levothyroxine  137 mcg Oral QAC breakfast  . multivitamin with minerals  1 tablet Oral Daily  . pantoprazole  40 mg Oral Daily    Family History    Family History  Problem Relation Age of Onset  .  Hypertension Mother   . Heart disease Mother        d. 44  . Parkinsonism Father        d. 86  . Heart disease Sister   . Breast cancer Sister 35       s/p mastectomy; reportedly negative BRCA1/2 testing approx 7 years ago  . Melanoma Sister        d. 87s; hx of incresed sun exposure  . Cancer Sister        dx. sarcoma of her face in her 47s; treated w/ MOHS surgery  . Parkinsonism Paternal Aunt        d. 90s  . Parkinsonism Paternal Uncle        d. late 30s  . Kidney failure Maternal Grandmother 44  . Heart attack Maternal Grandfather        d. 31s  . Heart disease Paternal Grandmother        d. 91  . Breast cancer Other 31  maternal great aunt (MGF's sister)  . Kidney cancer Paternal Uncle 59       d. 73s; unsure about smoking status    Social History    Social History   Social History  . Marital status: Married    Spouse name: N/A  . Number of children: 2  . Years of education: N/A   Occupational History  . Not on file.   Social History Main Topics  . Smoking status: Former Smoker    Packs/day: 0.25    Years: 15.00    Types: Cigarettes  . Smokeless tobacco: Never Used     Comment: social smoker/"on and off"  . Alcohol use 0.0 oz/week     Comment: social  . Drug use: No  . Sexual activity: Yes    Birth control/ protection: Post-menopausal   Other Topics Concern  . Not on file   Social History Narrative  . No narrative on file     Review of Systems    See history of present illness All other systems reviewed and are otherwise negative except as noted above.  Physical Exam    Blood pressure 108/67, pulse (!) 58, temperature 97.9 F (36.6 C), temperature source Oral, resp. rate 13, height 5' 3"  (1.6 m), weight 148 lb 4.8 oz (67.3 kg), last menstrual period 04/30/1996, SpO2 95 %.  General: Pleasant, Older white female NAD Psych: Normal affect. Neuro: Alert and oriented X 3. Moves all extremities spontaneously. HEENT: Normal  Neck: Supple  without bruits or JVD. Lungs:  Resp regular and unlabored, CTA. Heart: RRR no s3, s4, or murmurs. Abdomen: Soft, non-tender, non-distended, BS + x 4.  Extremities: No clubbing, cyanosis or edema. DP/PT/Radials 2+ and equal bilaterally.  Labs    Troponin [facility] of Care Test)  Recent Labs  12/31/16 1834  TROPIPOC 0.00    Recent Labs  12/31/16 1823 01/01/17 0205 01/01/17 0759  TROPONINI <0.03 0.44* 0.29*   Lab Results  Component Value Date   WBC 9.7 01/01/2017   HGB 13.4 01/01/2017   HCT 39.6 01/01/2017   MCV 95.4 01/01/2017   PLT 293 01/01/2017    Recent Labs Lab 12/31/16 1823  NA 133*  K 3.8  CL 96*  CO2 26  BUN 9  CREATININE 0.74  CALCIUM 9.2  GLUCOSE 112*   No results found for: CHOL, HDL, LDLCALC, TRIG Lab Results  Component Value Date   DDIMER <0.27 12/31/2016     Radiology Studies    Dg Chest 2 View  Result Date: 12/31/2016 CLINICAL DATA:  Chest pain difficulty breathing EXAM: CHEST  2 VIEW COMPARISON:  12/15/2015, CT chest 11/28/2016 FINDINGS: Hyperinflation. Linear atelectasis or scarring at both lung bases. No consolidation or effusion. Stable cardiomediastinal silhouette. No pneumothorax. Postsurgical changes of both breasts. IMPRESSION: Hyperinflation with linear scarring or atelectasis at the bases. Negative for edema, infiltrate or pneumothorax. Electronically Signed   By: Donavan Foil M.D.   On: 12/31/2016 18:44    ECG & Cardiac Imaging    EKG: Sinus rhythm  Assessment & Plan    71 year old female with past medical history of breast CA s/p lumpectomy and chemotherapy/radiation, anxiety, fibromyalgia, hypertension, hypothyroidism and insomnia who presented to the [facility] ED with chest pain.  1. Chest pain: Has had intermittent symptoms for the past week, which she felt were related to indigestion. Had a sudden onset of centralized chest pain with radiation to the right side of her neck and shoulder last evening while watching TV. Initial  troponin was negative with follow-up showing 0.4>> 0.2. She denies any history of tobacco use, and her hypertension is well controlled by her PCP. Does have significant family history of CAD with mother having bypass in her 75s, along with sister having multiple stents in her 44s. Symptoms are concerning for ACS. Ddimer neg. -- Continue IV heparin -- Plan for cardiac cath. The patient understands that risks included but are not limited to stroke (1 in 1000), death (1 in 73), kidney failure [usually temporary] (1 in 500), bleeding (1 in 200), allergic reaction [possibly serious] (1 in 200).  -- On aspirin, and statin. Will hold on adding beta blocker at this time given relative bradycardia and soft blood pressure. -- Check echo  2. Hypertension: Home hydrochlorothiazide on hold  3. Breast cancer s/p right lumpectomy and chemotherapy/radiation: Currently followed by oncology at [facility].  -- Reports being in a clinical trial  4. Hypothyroidism: On home Synthroid   Signed, Reino Bellis, NP-C Pager [phone] 01/01/2017, 11:35 AM   I have examined the patient and reviewed assessment and plan and discussed with patient.  Agree with above as stated.  Sx concerning for UA/NSTEMI.  THe risks and benfits of cath were explanied and she would like to proceed.  Husband was present as well.  Cardiac catheterization was discussed with the patient fully. The patient understands that risks include but are not limited to stroke (1 in 1000), death (1 in 23), kidney failure [usually temporary] (1 in 500), bleeding (1 in 200), allergic reaction [possibly serious] (1 in 200).  The patient understands and is willing to proceed.    OK for DAPT.  No planned surgeries.    Larae Grooms

## 2017-01-01 NOTE — Progress Notes (Signed)
CRITICAL VALUE ALERT  Critical Value:  Troponin .44  Date & Time Notied:  01/01/17 0315  Provider Notified: 01/01/17 0345  Orders Received/Actions taken:

## 2017-01-01 NOTE — Progress Notes (Signed)
ANTICOAGULATION CONSULT NOTE - Follow Up Consult  Pharmacy Consult for Heparin  Indication: chest pain/ACS  Allergies  Allergen Reactions  . Penicillins Other (See Comments)    Has patient had a PCN reaction causing immediate rash, facial/tongue/throat swelling, SOB or lightheadedness with hypotension: No Has patient had a PCN reaction causing severe rash involving mucus membranes or skin necrosis: No Has patient had a PCN reaction that required hospitalization No Has patient had a PCN reaction occurring within the last 10 years: No If all of the above answers are "NO", then may proceed with Cephalosporin use.  . Chlorhexidine Other (See Comments)    [place] IS OK. The CLOTH WIPE causes reaction. Causes itching all over the area  . Other Rash    Adhesive tape   Patient Measurements: Height: 5\' 3"  (160 cm) Weight: 148 lb 4.8 oz (67.3 kg) IBW/kg (Calculated) : 52.4 Vital Signs: Temp: 98.1 F (36.7 C) (09/04 0302) Temp Source: Oral (09/04 0302) BP: 123/67 (09/04 0302) Pulse Rate: 59 (09/04 0302)  Labs:  Recent Labs  12/31/16 1823 01/01/17 0156  HGB 14.1 13.4  HCT 40.0 39.6  PLT 324 293  LABPROT 12.5  --   INR 0.94  --   HEPARINUNFRC  --  0.63  CREATININE 0.74  --   TROPONINI <0.03  --     Estimated Creatinine Clearance: 58.6 mL/min (by C-G formula based on SCr of 0.74 mg/dL).   Assessment: Tx from [facility] on heparin for CP, initial heparin level this AM is therapeutic, cards to see  Goal of Therapy:  Heparin level 0.3-0.7 units/ml Monitor platelets by anticoagulation protocol: Yes   Plan:  -Cont heparin at 800 units/hr -1200 HL  Narda Bonds 01/01/2017,3:44 AM

## 2017-01-01 NOTE — Progress Notes (Signed)
ANTICOAGULATION CONSULT NOTE - Follow Up Consult  Pharmacy Consult for Heparin  Indication: chest pain/ACS  Allergies  Allergen Reactions  . Penicillins Other (See Comments)    Has patient had a PCN reaction causing immediate rash, facial/tongue/throat swelling, SOB or lightheadedness with hypotension: No Has patient had a PCN reaction causing severe rash involving mucus membranes or skin necrosis: No Has patient had a PCN reaction that required hospitalization No Has patient had a PCN reaction occurring within the last 10 years: No If all of the above answers are "NO", then may proceed with Cephalosporin use.  . Chlorhexidine Other (See Comments)    [place] IS OK. The CLOTH WIPE causes reaction. Causes itching all over the area  . Other Rash    Adhesive tape   Patient Measurements: Height: 5\' 3"  (160 cm) Weight: 148 lb 4.8 oz (67.3 kg) IBW/kg (Calculated) : 52.4 Vital Signs: Temp: 98.4 F (36.9 C) (09/04 1232) Temp Source: Oral (09/04 1232) BP: 128/72 (09/04 1232) Pulse Rate: 66 (09/04 1232)  Labs:  Recent Labs  12/31/16 1823 01/01/17 0156 01/01/17 0205 01/01/17 0759 01/01/17 1158  HGB 14.1 13.4  --   --   --   HCT 40.0 39.6  --   --   --   PLT 324 293  --   --   --   LABPROT 12.5  --   --   --   --   INR 0.94  --   --   --   --   HEPARINUNFRC  --  0.63  --   --  0.35  CREATININE 0.74  --   --   --   --   TROPONINI <0.03  --  0.44* 0.29*  --     Estimated Creatinine Clearance: 58.6 mL/min (by C-G formula based on SCr of 0.74 mg/dL).   Assessment: 71 year old female continues on heparin for chest pain Heparin level therapeutic  Goal of Therapy:  Heparin level 0.3-0.7 units/ml Monitor platelets by anticoagulation protocol: Yes   Plan:  Cont heparin at 800 units/hr Follow up after cath  Thank you Anette Guarneri, PharmD [phone] 01/01/2017,1:19 PM

## 2017-01-01 NOTE — Progress Notes (Signed)
PROGRESS NOTE  [name]  CXK:[id number] DOB: Apr 20, 1946 DOA: 12/31/2016 PCP: Seward Carol, MD  Outpatient Specialists: Cardiology, Aundra Dubin Brief Narrative: [name] is a 71 y.o. female with a history of right breast CA s/p lumpectomy and chemoradiation, depression and anxiety, fibromyalgia, HTN, and hypothyroidism who presented to the [facility] for substernal chest pain radiating to the right neck and arm described as "vicelike," first noticed while seated watching TV 9/3. This was similar to biliary colic prior to cholecystectomy in the past. Not improved with maalox or PPI. She had been having increasing epigastric burning discomfort, different from this pain, the past week for which her PCP recommended double her PPI which improved that symptom. She's had exertional dyspnea during this time as well.   On arrival to the ED she was in no respiratory distress. D-dimer was negative, troponin negative, and ECG demonstrated anterior ST depressions. CXR with hyperinflation without other abnormality. Pain was improved with morphine and NTG. EDP discussed with cardiology who recommended heparin infusion and transfer to [facility]. Troponin became positive at 0.44 overnight.   Assessment & Plan: Principal Problem:   Chest pain Active Problems:   Malignant neoplasm of upper-outer quadrant of right breast in female, estrogen receptor positive ([place])   Depression with anxiety   Hyponatremia   Chronic pain   Essential hypertension  NSTEMI: Chest pain, ST depressions with mild troponin elevation.  - Continue heparin infusion per pharmacy - I've consulted cardiology for evaluation. Pt is NPO.  - Continue morphine prn, oxygen prn, NTG prn - ASA, statin. Beta blocker not yet started due to borderline hypotension and bradycardia.  Right breast CA: s/p lumpectomy, chemoradiation.  - I've added Dr. Jana Hakim to the treatment team as FYI per pt request.  - Holding arimidex for now with ACS  Essential HTN:  BP controlled - Holding HCTZ due to hyponatremia - Prn hydralazine  Fibromyalgia:  - Continue home medications including vicodin  Depression/anxiety:  - With mild acute worsening due to illness.  - Continue home wellbutrin, prn valium and prn vistaril  Hypothyroidism:  - Continue synthroid - Check TSH  DVT prophylaxis: IV heparin Code Status: Full Family Communication: None at bedside Disposition Plan: Continue SDU monitoring  Consultants:   Cardiology  Procedures:   None  Antimicrobials:  None   Subjective: Chest pain stable, was improved with morphine last night but hasn't asked for it again. No dyspnea at rest. Nervous about the plan.   Objective: Vitals:   01/01/17 0200 01/01/17 0300 01/01/17 0302 01/01/17 0715  BP:   123/67 108/67  Pulse: 63  (!) 59 (!) 58  Resp: 13  14 13   Temp:   98.1 F (36.7 C) 97.9 F (36.6 C)  TempSrc:   Oral Oral  SpO2: 95%  97% 95%  Weight:  67.3 kg (148 lb 4.8 oz)    Height:  5\' 3"  (1.6 m)      Intake/Output Summary (Last 24 hours) at 01/01/17 1028 Last data filed at 01/01/17 0100  Gross per 24 hour  Intake               60 ml  Output                0 ml  Net               60 ml   Filed Weights   12/31/16 1819 01/01/17 0300  Weight: 64.9 kg (143 lb) 67.3 kg (148 lb 4.8 oz)  Gen: Pleasant 71 y.o. female in no distress Pulm: Non-labored breathing 2L by [place]. Globally diminished but clear.  CV: Regular borderline bradycardia. No murmur, rub, or gallop. No JVD, no pedal edema. GI: Abdomen soft, non-tender, non-distended, with normoactive bowel sounds. No organomegaly or masses felt. Ext: Warm, no deformities Skin: No rashes, lesions no ulcers. Incomplete alopecia.  Neuro: Alert and oriented. No focal neurological deficits. Psych: Judgement and insight appear normal. Mood & affect appropriate.   Data Reviewed: I have personally reviewed following labs and imaging studies  CBC:  Recent Labs Lab 12/31/16 1823  01/01/17 0156  WBC 7.5 9.7  HGB 14.1 13.4  HCT 40.0 39.6  MCV 93.9 95.4  PLT 324 081   Basic Metabolic Panel:  Recent Labs Lab 12/31/16 1823  NA 133*  K 3.8  CL 96*  CO2 26  GLUCOSE 112*  BUN 9  CREATININE 0.74  CALCIUM 9.2   GFR: Estimated Creatinine Clearance: 58.6 mL/min (by C-G formula based on SCr of 0.74 mg/dL). Liver Function Tests: No results for input(s): AST, ALT, ALKPHOS, BILITOT, PROT, ALBUMIN in the last 168 hours. No results for input(s): LIPASE, AMYLASE in the last 168 hours. No results for input(s): AMMONIA in the last 168 hours. Coagulation Profile:  Recent Labs Lab 12/31/16 1823  INR 0.94   Cardiac Enzymes:  Recent Labs Lab 12/31/16 1823 01/01/17 0205 01/01/17 0759  TROPONINI <0.03 0.44* 0.29*   BNP (last 3 results) No results for input(s): PROBNP in the last 8760 hours. HbA1C: No results for input(s): HGBA1C in the last 72 hours. CBG: No results for input(s): GLUCAP in the last 168 hours. Lipid Profile: No results for input(s): CHOL, HDL, LDLCALC, TRIG, CHOLHDL, LDLDIRECT in the last 72 hours. Thyroid Function Tests: No results for input(s): TSH, T4TOTAL, FREET4, T3FREE, THYROIDAB in the last 72 hours. Anemia Panel: No results for input(s): VITAMINB12, FOLATE, FERRITIN, TIBC, IRON, RETICCTPCT in the last 72 hours. Urine analysis:    Component Value Date/Time   COLORURINE YELLOW 12/15/2015 2208   APPEARANCEUR CLEAR 12/15/2015 2208   LABSPEC 1.014 12/15/2015 2208   PHURINE 6.5 12/15/2015 2208   GLUCOSEU NEGATIVE 12/15/2015 2208   HGBUR NEGATIVE 12/15/2015 2208   BILIRUBINUR n 10/01/2016 1316   KETONESUR NEGATIVE 12/15/2015 2208   PROTEINUR n 10/01/2016 1316   PROTEINUR NEGATIVE 12/15/2015 2208   UROBILINOGEN 0.2 10/01/2016 1316   NITRITE n 10/01/2016 1316   NITRITE NEGATIVE 12/15/2015 2208   LEUKOCYTESUR Large (3+) (A) 10/01/2016 1316   Recent Results (from the past 240 hour(s))  MRSA PCR Screening     Status: None    Collection Time: 01/01/17  3:52 AM  Result Value Ref Range Status   MRSA by PCR NEGATIVE NEGATIVE Final    Comment:        The GeneXpert MRSA Assay (FDA approved for NASAL specimens only), is one component of a comprehensive MRSA colonization surveillance program. It is not intended to diagnose MRSA infection nor to guide or monitor treatment for MRSA infections.       Radiology Studies: Dg Chest 2 View  Result Date: 12/31/2016 CLINICAL DATA:  Chest pain difficulty breathing EXAM: CHEST  2 VIEW COMPARISON:  12/15/2015, CT chest 11/28/2016 FINDINGS: Hyperinflation. Linear atelectasis or scarring at both lung bases. No consolidation or effusion. Stable cardiomediastinal silhouette. No pneumothorax. Postsurgical changes of both breasts. IMPRESSION: Hyperinflation with linear scarring or atelectasis at the bases. Negative for edema, infiltrate or pneumothorax. Electronically Signed   By: Madie Reno.D.  On: 12/31/2016 18:44    Scheduled Meds: . aspirin EC  81 mg Oral Daily  . atorvastatin  40 mg Oral q1800  . buPROPion  300 mg Oral Daily  . cycloSPORINE  1 drop Both Eyes BID  . fluconazole  100 mg Oral Daily  . levothyroxine  137 mcg Oral QAC breakfast  . multivitamin with minerals  1 tablet Oral Daily  . pantoprazole  40 mg Oral Daily   Continuous Infusions: . heparin 800 Units/hr (01/01/17 0200)     LOS: 0 days   Time spent: 25 minutes.  Vance Gather, MD [facility] Pager [phone]  If 7PM-7AM, please contact night-coverage [email or web address] Password [email or web address] 01/01/2017, 10:28 AM

## 2017-01-01 NOTE — Interval H&P Note (Signed)
Cath Lab Visit (complete for each Cath Lab visit)  Clinical Evaluation Leading to the Procedure:   ACS: Yes.    Non-ACS:    Anginal Classification: CCS IV  Anti-ischemic medical therapy: Minimal Therapy (1 class of medications)  Non-Invasive Test Results: No non-invasive testing performed  Prior CABG: No previous CABG      History and Physical Interval Note:  01/01/2017 3:06 PM  [name]  has presented today for surgery, with the diagnosis of unstable angina  The various methods of treatment have been discussed with the patient and family. After consideration of risks, benefits and other options for treatment, the patient has consented to  Procedure(s): LEFT HEART CATH AND CORONARY ANGIOGRAPHY (N/A) as a surgical intervention .  The patient's history has been reviewed, patient examined, no change in status, stable for surgery.  I have reviewed the patient's chart and labs.  Questions were answered to the patient's satisfaction.     Larae Grooms

## 2017-01-01 NOTE — H&P (View-Only) (Signed)
Cardiology Consult    Patient ID: SHAM ALVIAR MRN: [id number], DOB/AGE: 72-10-1945   Admit date: 12/31/2016 Date of Consult: 01/01/2017  Primary Physician: Seward Carol, MD Primary Cardiologist: New Requesting Provider: Bonner Puna Reason for Consultation: chest pain, + trop  [name] is a 71 y.o. female who is being seen today for the evaluation of chest pain at the request of Dr. Bonner Puna.   Patient Profile    71 year old female with past medical history of breast CA s/p lumpectomy and chemotherapy/radiation, anxiety, fibromyalgia, hypertension, hypothyroidism and insomnia who presented to the [facility] ED with chest pain.  Past Medical History   Past Medical History:  Diagnosis Date  . Anxiety   . Arthritis 2008  . Asthma 1953   age 7  . Cancer [facility])    breast cancer  . Chronic vaginitis 1998  . Fibromyalgia 1982  . GERD (gastroesophageal reflux disease)    since at least 2011  . Hemorrhoids 2009  . History of radiation therapy 01/18/16-03/05/16   right breast/nodal areas 45 Gy, right breast boost 16 Gy  . Hypoglycemia 2003  . Hypothyroidism 1964   age 24  . Interstitial cystitis 1998  . Irritable bowel syndrome 06/14/2009   Qualifier: Diagnosis of  By: Patsy Baltimore RN, Langley Gauss    . Menopausal symptoms   . Osteoporosis 12/24/2006  . Pneumonia 1979   varicella  . PONV (postoperative nausea and vomiting) 1979    Past Surgical History:  Procedure Laterality Date  . BLADDER HYPEREXTENSION     X4, prior to 01/2006  . BLADDER SUSPENSION    . BREAST LUMPECTOMY WITH NEEDLE LOCALIZATION AND AXILLARY LYMPH NODE DISSECTION Right 12/05/2015   Procedure: RIGHT BREAST NEEDLE LOCALIZED (WIRES X 2) LUMPECTOMY WITH AXILLARY LYMPH NODE DISSECTION;  Surgeon: Fanny Skates, MD;  Location: [place];  Service: General;  Laterality: Right;  . BREAST LUMPECTOMY WITH RADIOACTIVE SEED LOCALIZATION Left 12/05/2015   Procedure: RADIOACTIVE SEED GUIDED LEFT BREAST LUMPECTOMY;  Surgeon: Fanny Skates, MD;  Location: [place];  Service: General;  Laterality: Left;  . BREAST SURGERY    . BUNIONECTOMY     prior to 01/2006  . CESAREAN SECTION  1979  . CHOLECYSTECTOMY  02/08/2006   DR. INGRAM  . COLONOSCOPY    . DENTAL SURGERY  2009-2016   13 dental implants  . LEG SURGERY  07/06/2004   Broken tibia  . NASAL SINUS SURGERY     X4; prior to 01/2006  . PORT-A-CATH REMOVAL Right 12/05/2015   Procedure: REMOVAL PORT-A-CATH;  Surgeon: Fanny Skates, MD;  Location: [place];  Service: General;  Laterality: Right;  . PORTACATH PLACEMENT Left 09/08/2015   Procedure: INSERTION OF PORT-A-CATH ;  Surgeon: Fanny Skates, MD;  Location: [place];  Service: General;  Laterality: Left;     Allergies  Allergies  Allergen Reactions  . Penicillins Other (See Comments)    Has patient had a PCN reaction causing immediate rash, facial/tongue/throat swelling, SOB or lightheadedness with hypotension: No Has patient had a PCN reaction causing severe rash involving mucus membranes or skin necrosis: No Has patient had a PCN reaction that required hospitalization No Has patient had a PCN reaction occurring within the last 10 years: No If all of the above answers are "NO", then may proceed with Cephalosporin use.  . Chlorhexidine Other (See Comments)    [place] IS OK. The CLOTH WIPE causes reaction. Causes itching all over the area  . Other Rash    Adhesive tape  History of Present Illness    [name] is a 71 year old female with past medical history of breast CA s/p lumpectomy and chemotherapy/radiation, anxiety, fibromyalgia, hypertension, hypothyroidism and insomnia.  She recently underwent chemotherapy/radiation and lumpectomy on the right side last year after being diagnosed with breast CA. She is followed by Dr. Jana Hakim at the [facility], and is currently enrolled in a clinical trial. She denies any previous history of CAD. Reports strong family history with mother having bypass in her  14s, and sister having multiple stents in her 42s. Has never seen a cardiologist in the past, and reports her blood pressure is normally well controlled.  States she was just seen by her oncologist and given a good report. Underwent CT chest with contrast on 11/28/16 which noted coronary artery calcifications, with stable lung nodules. She was plan for follow-up in November with her oncologist and a CT chest in one year. States about a week ago she began to experience what she thought her GI symptoms. Reported indigestion and some mild dyspnea, and saw her PCP for this. Protonix was increased to twice a day with some improvement, and she used her inhaler with improvement of her dyspnea. States she was sitting at home yesterday watchinh TV when she experienced a sudden onset of severe sharp centralized chest pain with radiation into the right side of her neck and shoulder. States this was very different than her indigestion symptoms. Attempted antacids without relief. Her symptoms persisted and her husband brought her to [facility] for further evaluation.   In the ED her labs showed mild hyponatremia, other electrolytes stable, troponin 0.03, hemoglobin 14.1, d-dimer negative. EKG showed normal sinus rhythm with no acute ST/T-wave abnormalities. She was given sublingual nitroglycerin and IV morphine with resolution of her pain. Given her symptoms and family history cardiology was consult and recommended starting IV heparin with transfer to call for further workup. Subsequent troponin showed 0.4>> 0.29.   Inpatient Medications    . aspirin EC  81 mg Oral Daily  . atorvastatin  40 mg Oral q1800  . buPROPion  300 mg Oral Daily  . cycloSPORINE  1 drop Both Eyes BID  . fluconazole  100 mg Oral Daily  . levothyroxine  137 mcg Oral QAC breakfast  . multivitamin with minerals  1 tablet Oral Daily  . pantoprazole  40 mg Oral Daily    Family History    Family History  Problem Relation Age of Onset  .  Hypertension Mother   . Heart disease Mother        d. 56  . Parkinsonism Father        d. 64  . Heart disease Sister   . Breast cancer Sister 70       s/p mastectomy; reportedly negative BRCA1/2 testing approx 7 years ago  . Melanoma Sister        d. 11s; hx of incresed sun exposure  . Cancer Sister        dx. sarcoma of her face in her 107s; treated w/ MOHS surgery  . Parkinsonism Paternal Aunt        d. 90s  . Parkinsonism Paternal Uncle        d. late 67s  . Kidney failure Maternal Grandmother 90  . Heart attack Maternal Grandfather        d. 69s  . Heart disease Paternal Grandmother        d. 27  . Breast cancer Other 80  maternal great aunt (MGF's sister)  . Kidney cancer Paternal Uncle 58       d. 66s; unsure about smoking status    Social History    Social History   Social History  . Marital status: Married    Spouse name: N/A  . Number of children: 2  . Years of education: N/A   Occupational History  . Not on file.   Social History Main Topics  . Smoking status: Former Smoker    Packs/day: 0.25    Years: 15.00    Types: Cigarettes  . Smokeless tobacco: Never Used     Comment: social smoker/"on and off"  . Alcohol use 0.0 oz/week     Comment: social  . Drug use: No  . Sexual activity: Yes    Birth control/ protection: Post-menopausal   Other Topics Concern  . Not on file   Social History Narrative  . No narrative on file     Review of Systems    See history of present illness All other systems reviewed and are otherwise negative except as noted above.  Physical Exam    Blood pressure 108/67, pulse (!) 58, temperature 97.9 F (36.6 C), temperature source Oral, resp. rate 13, height 5' 3"  (1.6 m), weight 148 lb 4.8 oz (67.3 kg), last menstrual period 04/30/1996, SpO2 95 %.  General: Pleasant, Older white female NAD Psych: Normal affect. Neuro: Alert and oriented X 3. Moves all extremities spontaneously. HEENT: Normal  Neck: Supple  without bruits or JVD. Lungs:  Resp regular and unlabored, CTA. Heart: RRR no s3, s4, or murmurs. Abdomen: Soft, non-tender, non-distended, BS + x 4.  Extremities: No clubbing, cyanosis or edema. DP/PT/Radials 2+ and equal bilaterally.  Labs    Troponin [facility] Test)  Recent Labs  12/31/16 1834  TROPIPOC 0.00    Recent Labs  12/31/16 1823 01/01/17 0205 01/01/17 0759  TROPONINI <0.03 0.44* 0.29*   Lab Results  Component Value Date   WBC 9.7 01/01/2017   HGB 13.4 01/01/2017   HCT 39.6 01/01/2017   MCV 95.4 01/01/2017   PLT 293 01/01/2017    Recent Labs Lab 12/31/16 1823  NA 133*  K 3.8  CL 96*  CO2 26  BUN 9  CREATININE 0.74  CALCIUM 9.2  GLUCOSE 112*   No results found for: CHOL, HDL, LDLCALC, TRIG Lab Results  Component Value Date   DDIMER <0.27 12/31/2016     Radiology Studies    Dg Chest 2 View  Result Date: 12/31/2016 CLINICAL DATA:  Chest pain difficulty breathing EXAM: CHEST  2 VIEW COMPARISON:  12/15/2015, CT chest 11/28/2016 FINDINGS: Hyperinflation. Linear atelectasis or scarring at both lung bases. No consolidation or effusion. Stable cardiomediastinal silhouette. No pneumothorax. Postsurgical changes of both breasts. IMPRESSION: Hyperinflation with linear scarring or atelectasis at the bases. Negative for edema, infiltrate or pneumothorax. Electronically Signed   By: Donavan Foil M.D.   On: 12/31/2016 18:44    ECG & Cardiac Imaging    EKG: Sinus rhythm  Assessment & Plan    71 year old female with past medical history of breast CA s/p lumpectomy and chemotherapy/radiation, anxiety, fibromyalgia, hypertension, hypothyroidism and insomnia who presented to the [facility] ED with chest pain.  1. Chest pain: Has had intermittent symptoms for the past week, which she felt were related to indigestion. Had a sudden onset of centralized chest pain with radiation to the right side of her neck and shoulder last evening while watching TV. Initial  troponin was negative with follow-up showing 0.4>> 0.2. She denies any history of tobacco use, and her hypertension is well controlled by her PCP. Does have significant family history of CAD with mother having bypass in her 73s, along with sister having multiple stents in her 56s. Symptoms are concerning for ACS. Ddimer neg. -- Continue IV heparin -- Plan for cardiac cath. The patient understands that risks included but are not limited to stroke (1 in 1000), death (1 in 19), kidney failure [usually temporary] (1 in 500), bleeding (1 in 200), allergic reaction [possibly serious] (1 in 200).  -- On aspirin, and statin. Will hold on adding beta blocker at this time given relative bradycardia and soft blood pressure. -- Check echo  2. Hypertension: Home hydrochlorothiazide on hold  3. Breast cancer s/p right lumpectomy and chemotherapy/radiation: Currently followed by oncology at [facility].  -- Reports being in a clinical trial  4. Hypothyroidism: On home Synthroid   Signed, Reino Bellis, NP-C Pager [phone] 01/01/2017, 11:35 AM   I have examined the patient and reviewed assessment and plan and discussed with patient.  Agree with above as stated.  Sx concerning for UA/NSTEMI.  THe risks and benfits of cath were explanied and she would like to proceed.  Husband was present as well.  Cardiac catheterization was discussed with the patient fully. The patient understands that risks include but are not limited to stroke (1 in 1000), death (1 in 17), kidney failure [usually temporary] (1 in 500), bleeding (1 in 200), allergic reaction [possibly serious] (1 in 200).  The patient understands and is willing to proceed.    OK for DAPT.  No planned surgeries.    [name]

## 2017-01-02 ENCOUNTER — Encounter (HOSPITAL_COMMUNITY): Payer: Self-pay | Admitting: Interventional Cardiology

## 2017-01-02 ENCOUNTER — Inpatient Hospital Stay (HOSPITAL_COMMUNITY): Payer: Medicare Other

## 2017-01-02 DIAGNOSIS — R072 Precordial pain: Secondary | ICD-10-CM

## 2017-01-02 DIAGNOSIS — Z955 Presence of coronary angioplasty implant and graft: Secondary | ICD-10-CM

## 2017-01-02 DIAGNOSIS — I214 Non-ST elevation (NSTEMI) myocardial infarction: Principal | ICD-10-CM

## 2017-01-02 LAB — CBC
HEMATOCRIT: 36.4 % (ref 36.0–46.0)
HEMOGLOBIN: 12.6 g/dL (ref 12.0–15.0)
MCH: 33.2 pg (ref 26.0–34.0)
MCHC: 34.6 g/dL (ref 30.0–36.0)
MCV: 96 fL (ref 78.0–100.0)
Platelets: 258 10*3/uL (ref 150–400)
RBC: 3.79 MIL/uL — ABNORMAL LOW (ref 3.87–5.11)
RDW: 14.2 % (ref 11.5–15.5)
WBC: 7.7 10*3/uL (ref 4.0–10.5)

## 2017-01-02 LAB — LIPID PANEL
CHOL/HDL RATIO: 2.9 ratio
CHOLESTEROL: 224 mg/dL — AB (ref 0–200)
HDL: 77 mg/dL (ref >40)
LDL CALC: 128 mg/dL — AB (ref 0–99)
Triglycerides: 93 mg/dL (ref <150)
VLDL: 19 mg/dL (ref 0–40)

## 2017-01-02 LAB — ECHOCARDIOGRAM COMPLETE
HEIGHTINCHES: 63 in
WEIGHTICAEL: 2288 [oz_av]

## 2017-01-02 LAB — BASIC METABOLIC PANEL
ANION GAP: 8 (ref 5–15)
BUN: 6 mg/dL (ref 6–20)
CHLORIDE: 105 mmol/L (ref 101–111)
CO2: 22 mmol/L (ref 22–32)
Calcium: 8.4 mg/dL — ABNORMAL LOW (ref 8.9–10.3)
Creatinine, Ser: 0.81 mg/dL (ref 0.44–1.00)
GFR calc Af Amer: >60 mL/min (ref >60)
GFR calc non Af Amer: >60 mL/min (ref >60)
GLUCOSE: 85 mg/dL (ref 65–99)
POTASSIUM: 3.7 mmol/L (ref 3.5–5.1)
Sodium: 135 mmol/L (ref 135–145)

## 2017-01-02 LAB — TSH: TSH: 1.325 u[IU]/mL (ref 0.350–4.500)

## 2017-01-02 MED ORDER — PROCHLORPERAZINE MALEATE 10 MG PO TABS
10.0000 mg | ORAL_TABLET | Freq: Four times a day (QID) | ORAL | Status: DC | PRN
  Filled 2017-01-02: qty 1

## 2017-01-02 MED ORDER — ALBUTEROL SULFATE (2.5 MG/3ML) 0.083% IN NEBU: 2.5000 mg | INHALATION_SOLUTION | Freq: Four times a day (QID) | RESPIRATORY_TRACT | Status: DC | PRN

## 2017-01-02 MED ORDER — MINOXIDIL 2 % EX SOLN: Freq: Two times a day (BID) | CUTANEOUS | Status: DC

## 2017-01-02 MED ORDER — METOPROLOL SUCCINATE ER 25 MG PO TB24
12.5000 mg | ORAL_TABLET | Freq: Every day | ORAL | Status: DC
  Administered 2017-01-02 – 2017-01-03 (×2): 12.5 mg via ORAL
  Filled 2017-01-02 (×2): qty 1

## 2017-01-02 MED ORDER — CLOTRIMAZOLE 1 % VA CREA
1.0000 | TOPICAL_CREAM | Freq: Every day | VAGINAL | Status: DC
  Administered 2017-01-02: 1 via VAGINAL
  Filled 2017-01-02: qty 45

## 2017-01-02 MED ORDER — CARISOPRODOL 350 MG PO TABS: 350.0000 mg | ORAL_TABLET | Freq: Two times a day (BID) | ORAL | Status: DC | PRN

## 2017-01-02 MED ORDER — ONDANSETRON HCL 4 MG PO TABS
4.0000 mg | ORAL_TABLET | Freq: Every day | ORAL | Status: DC | PRN
Start: 2017-01-02 — End: 2017-01-03

## 2017-01-02 NOTE — Progress Notes (Signed)
CARDIAC REHAB PHASE I   PRE:  Rate/Rhythm: 60 SR  BP:  Supine:   Sitting: 137/68  Standing:    SaO2: 97%RA  MODE:  Ambulation: 400 ft   POST:  Rate/Rhythm: 79 SR  BP:  Supine:   Sitting: 125/71  Standing:    SaO2: 97%RA 0915-1040 Waited for pt to get pain med prior to walk. MI education completed. Stressed importance of brilinta with stent. Case manager saw pt and gave her brilinta card. Reviewed NTG use, ex ed, MI restrictions, heart healthy eating and CRP 2. Will refer to [place] program. Assisted pt to bathroom and then we walked 400 ft. She has a little tightness in chest but pt stated it felt more like respiratory. Was not like pain prior to PCI. C/o feeling tired after walk. Was able to walk and talk. No DOE noted. Feeling a little nauseated also which is not unusual for pt. Requested compazine from RN.   Graylon Good, RN BSN  01/02/2017 10:35 AM

## 2017-01-02 NOTE — Progress Notes (Signed)
Progress Note  Patient Name: [name] Date of Encounter: 01/02/2017  Primary Cardiologist:  Linard Millers  Subjective   Residual chest pressure since multivessel PCI performed yesterday. Denies dyspnea. Ambulated today without discomfort worsening. Discomfort is gradually improving  Inpatient Medications    Scheduled Meds: . aspirin EC  81 mg Oral Daily  . atorvastatin  40 mg Oral q1800  . buPROPion  300 mg Oral Daily  . cycloSPORINE  1 drop Both Eyes BID  . fluconazole  100 mg Oral Daily  . levothyroxine  137 mcg Oral QAC breakfast  . multivitamin with minerals  1 tablet Oral Daily  . pantoprazole  40 mg Oral Daily  . sodium chloride flush  3 mL Intravenous Q12H  . ticagrelor  90 mg Oral BID   Continuous Infusions: . sodium chloride     PRN Meds: sodium chloride, acetaminophen, albuterol, alum & mag hydroxide-simeth, carisoprodol, diazepam, HYDROcodone-acetaminophen, hydrOXYzine, morphine injection, nitroGLYCERIN, ondansetron (ZOFRAN) IV, ondansetron, polyvinyl alcohol, prochlorperazine, sodium chloride flush, zolpidem   Vital Signs    Vitals:   01/02/17 0430 01/02/17 0436 01/02/17 0742 01/02/17 1156  BP: (!) 108/55  116/62 113/71  Pulse: 71  (!) 59 60  Resp: 16  (!) 26 (!) 21  Temp: 98.1 F (36.7 C)  97.8 F (36.6 C) 98.1 F (36.7 C)  TempSrc: Oral  Oral Oral  SpO2: 95%  97% 97%  Weight:  143 lb (64.9 kg)    Height:        Intake/Output Summary (Last 24 hours) at 01/02/17 1902 Last data filed at 01/02/17 1425  Gross per 24 hour  Intake             1080 ml  Output                0 ml  Net             1080 ml   Filed Weights   12/31/16 1819 01/01/17 0300 01/02/17 0436  Weight: 143 lb (64.9 kg) 148 lb 4.8 oz (67.3 kg) 143 lb (64.9 kg)    Telemetry    NSR without PVCs or significant ventricular ectopy. - Personally Reviewed  ECG    Performed 01/02/2017 reveals sinus bradycardia with diffuse T-wave inversion. The T-wave inversion is new. - Personally  Reviewed  Physical Exam  Lying comfortably. Alopecia noted. No respiratory distress. GEN: No acute distress.   Neck: No JVD Cardiac: RRR, no murmurs, rubs, or gallops.  Respiratory: Clear to auscultation bilaterally. GI: Soft, nontender, non-distended  MS: No edema; No deformity. Neuro:  Nonfocal  Psych: Normal affect   Labs    Chemistry Recent Labs Lab 12/31/16 1823 01/02/17 0233  NA 133* 135  K 3.8 3.7  CL 96* 105  CO2 26 22  GLUCOSE 112* 85  BUN 9 6  CREATININE 0.74 0.81  CALCIUM 9.2 8.4*  GFRNONAA >60 >60  GFRAA >60 >60  ANIONGAP 11 8     Hematology Recent Labs Lab 12/31/16 1823 01/01/17 0156 01/02/17 0233  WBC 7.5 9.7 7.7  RBC 4.26 4.15 3.79*  HGB 14.1 13.4 12.6  HCT 40.0 39.6 36.4  MCV 93.9 95.4 96.0  MCH 33.1 32.3 33.2  MCHC 35.3 33.8 34.6  RDW 13.6 13.5 14.2  PLT 324 293 258    Cardiac Enzymes Recent Labs Lab 12/31/16 1823 01/01/17 0205 01/01/17 0759  TROPONINI <0.03 0.44* 0.29*    Recent Labs Lab 12/31/16 1834  TROPIPOC 0.00     BNPNo  results for input(s): BNP, PROBNP in the last 168 hours.   DDimer  Recent Labs Lab 12/31/16 1823  DDIMER <0.27     Radiology    No results found.  Cardiac Studies   Cardiac catheterization/PCI report 01/01/2017: Coronary Diagrams   Diagnostic Diagram       Post-Intervention Diagram        ECHOCARDIOGRAPHY 01/02/2017: Study Conclusions  - Left ventricle: The cavity size was normal. Wall thickness was   normal. Systolic function was normal. The estimated ejection   fraction was in the range of 60% to 65%. Wall motion was normal;   there were no regional wall motion abnormalities. Doppler   parameters are consistent with abnormal left ventricular   relaxation (grade 1 diastolic dysfunction).  Impressions:  - Normal LV systolic function; mild diastolic dysfunction; strain   not performed.   Patient Profile     71 y.o. female with past medical history of breast CA s/p  lumpectomy and chemotherapy/radiation, anxiety, fibromyalgia, hypertension, hypothyroidism and insomnia who presented to the [facility] ED with chest pain, And an catheterization was found to have three-vessel coronary disease that was treated with multivessel culprit PCI on 01/01/2017 by Dr. Irish Lack.  Assessment & Plan    1. Multivessel coronary disease with multivessel PCI performed 01/01/2017. The patient follows in the low syntax score cohort, less than 22. Good angiographic results. Plan is aggressive risk factor modification (high intensity statin therapy, beta blocker therapy, and consider ACE inhibitor therapy), prolonged dual antiplatelet therapy (aspirin and Brilinta), and phase II cardiac rehabilitation.  Eligible for discharge in a.m. if ambulates without difficulty and all teaching has been performed.  Signed, Sinclair Grooms, MD  01/02/2017, 7:02 PM

## 2017-01-02 NOTE — Progress Notes (Signed)
PROGRESS NOTE  [name]  ENI:[id number] DOB: Aug 07, 1945 DOA: 12/31/2016 PCP: Seward Carol, MD  Outpatient Specialists: Cardiology, Aundra Dubin Brief Narrative: [name] is a 71 y.o. female with a history of right breast CA s/p lumpectomy and chemoradiation, depression and anxiety, fibromyalgia, HTN, and hypothyroidism who presented to the [facility] for substernal chest pain radiating to the right neck and arm described as "vicelike," first noticed while seated watching TV 9/3. This was similar to biliary colic prior to cholecystectomy in the past. Not improved with maalox or PPI. She had been having increasing epigastric burning discomfort, different from this pain, the past week for which her PCP recommended double her PPI which improved that symptom. She's had exertional dyspnea during this time as well.   On arrival to the ED she was in no respiratory distress. D-dimer was negative, troponin negative, and ECG demonstrated anterior ST depressions. CXR with hyperinflation without other abnormality. Pain was improved with morphine and NTG. EDP discussed with cardiology who recommended heparin infusion and transfer to [facility]. Troponin became positive at 0.44 overnight. Cardiac catheterization was performed 9/4 showing severe multivessel disease. Decision was made to perform multivessel PCI in lieu of CABG, now s/p DES x4.   Assessment & Plan: Principal Problem:   Chest pain Active Problems:   Malignant neoplasm of upper-outer quadrant of right breast in female, estrogen receptor positive (HCC)   Depression with anxiety   Hyponatremia   Chronic pain   Essential hypertension   Non-ST elevation (NSTEMI) myocardial infarction [facility])  NSTEMI, CAD s/p 4 stents 9/4 by Dr. Irish Lack:  - Cardiac rehab - Check lipid panel, augment to higher intensity if needed (currently lipitor 40mg ). Check HbA1c.  - Aspirin 81mg  + brilinta x12 months - Continue morphine prn, oxygen prn, NTG prn - ASA, statin. Beta  blocker not started due to bradycardia.  Right breast CA: s/p lumpectomy, chemoradiation.  - Dr. Jana Hakim paid a social visit to the patient 9/5. - Holding arimidex for now with ACS  Essential HTN: BP controlled - Held HCTZ due to hyponatremia, still intermittently low BP. Consider restarting based on today's BP's.  - Prn hydralazine  Fibromyalgia:  - Continue home medications including vicodin  Depression/anxiety:  - With mild acute worsening due to illness.  - Continue home wellbutrin, prn valium and prn vistaril  Hypothyroidism: TSH wnl at 1.325 - Continue synthroid  DVT prophylaxis: Enoxaparin Code Status: Full Family Communication: None at bedside Disposition Plan: Transfer to telemetry, cardiac rehab, anticipate DC home in next 24 hours.  Consultants:   Cardiology  Procedures:  CARDIAC CATHETERIZATION 01/01/2017:  The left ventricular systolic function is normal.  LV end diastolic pressure is normal.  The left ventricular ejection fraction is 55-65% by visual estimate.  There is no aortic valve stenosis.  Prox Cx to Mid Cx lesion, 95 %stenosed.  A STENT PROMUS PREM MR 2.75X16 drug eluting stent was successfully placed, postdilated to >52mm.  Post intervention, there is a 0% residual stenosis.  Mid LAD-2 lesion, 80 %stenosed.  A STENT PROMUS PREM MR 6.14E31 drug eluting stent was successfully placed, postdilated to >85mm.  Post intervention, there is a 0% residual stenosis.  Mid RCA lesion, 90 %stenosed.  A STENT PROMUS PREM MR 3.0X20 drug eluting stent was successfully placed, postdilated to 3.3 mm.  Post intervention, there is a 0% residual stenosis.  Dist RCA-2 lesion, 70 %stenosed.  A STENT PROMUS PREM MR 2.5X12 drug eluting stent was successfully placed.  Post intervention, there is a 10%  residual stenosis.   Severe three vessel disease.  Given the patient's other comorbidities including multiple surgeries for breast cancer, she was adament that  if PCI could be done she wanted to avoid CABG.  She is somewhat frail, having lost 40 lbs with chemotherapy.  After discussion with Dr. Tamala Julian, we proceded with multivessel PCI.    Successful DES placement to the mid LAD, mid circumflex, mid and distal RCA with short stents.  Continue DAPT for at least 12 months along with aggressive secondary prevention.   Antimicrobials:  None   Subjective: Still with mild, but bothersome, mid chest pain that is worse with palpation. Hasn't exerted herself much yet. Having intermittent shortness of breath that she related to anxiety and possibly asthma for which she usually takes albuterol.   Objective: BP 116/62 (BP Location: Left Arm)   Pulse (!) 59   Temp 97.8 F (36.6 C) (Oral)   Resp (!) 26   Ht 5\' 3"  (1.6 m)   Wt 64.9 kg (143 lb)   LMP 04/30/1996 (Approximate)   SpO2 97%   BMI 25.33 kg/m   Gen: Pleasant 71 y.o. female in no distress Pulm: Non-labored breathing room air. Globally diminished but clear.  CV: Regular rate and rhythm. No murmur, rub, or gallop. No JVD, no pedal edema. Chest wall mildly tender to palpation anteriorly. GI: Abdomen soft, non-tender, non-distended, with normoactive bowel sounds. No organomegaly or masses felt. Ext: Warm, no deformities Skin: No rashes, lesions no ulcers. Incomplete alopecia.  Neuro: Alert and oriented. No focal neurological deficits. Psych: Judgement and insight appear normal. Mood anxious & affect appropriate.   CBC:  Recent Labs Lab 12/31/16 1823 01/01/17 0156 01/02/17 0233  WBC 7.5 9.7 7.7  HGB 14.1 13.4 12.6  HCT 40.0 39.6 36.4  MCV 93.9 95.4 96.0  PLT 324 293 678   Basic Metabolic Panel:  Recent Labs Lab 12/31/16 1823 01/02/17 0233  NA 133* 135  K 3.8 3.7  CL 96* 105  CO2 26 22  GLUCOSE 112* 85  BUN 9 6  CREATININE 0.74 0.81  CALCIUM 9.2 8.4*   GFR: Estimated Creatinine Clearance: 56.9 mL/min (by C-G formula based on SCr of 0.81 mg/dL).  Cardiac Enzymes:  Recent  Labs Lab 12/31/16 1823 01/01/17 0205 01/01/17 0759  TROPONINI <0.03 0.44* 0.29*   HbA1C: No results for input(s): HGBA1C in the last 72 hours.  Lipid Profile: No results for input(s): CHOL, HDL, LDLCALC, TRIG, CHOLHDL, LDLDIRECT in the last 72 hours. Thyroid Function Tests:  Recent Labs  01/02/17 0233  TSH 1.325      Radiology Studies: Dg Chest 2 View  Result Date: 12/31/2016 CLINICAL DATA:  Chest pain difficulty breathing EXAM: CHEST  2 VIEW COMPARISON:  12/15/2015, CT chest 11/28/2016 FINDINGS: Hyperinflation. Linear atelectasis or scarring at both lung bases. No consolidation or effusion. Stable cardiomediastinal silhouette. No pneumothorax. Postsurgical changes of both breasts. IMPRESSION: Hyperinflation with linear scarring or atelectasis at the bases. Negative for edema, infiltrate or pneumothorax. Electronically Signed   By: Donavan Foil M.D.   On: 12/31/2016 18:44    Scheduled Meds: . aspirin EC  81 mg Oral Daily  . atorvastatin  40 mg Oral q1800  . buPROPion  300 mg Oral Daily  . cycloSPORINE  1 drop Both Eyes BID  . fluconazole  100 mg Oral Daily  . levothyroxine  137 mcg Oral QAC breakfast  . multivitamin with minerals  1 tablet Oral Daily  . pantoprazole  40 mg Oral Daily  .  sodium chloride flush  3 mL Intravenous Q12H  . ticagrelor  90 mg Oral BID    LOS: 1 day   Time spent: 25 minutes.  Vance Gather, MD [facility] Pager [phone]  If 7PM-7AM, please contact night-coverage [email or web address] Password [email or web address] 01/02/2017, 9:47 AM

## 2017-01-02 NOTE — Care Management Note (Addendum)
Case Management Note  Patient Details  Name: [name] MRN: [id number] Date of Birth: 1945-05-05  Subjective/Objective:   Pt admitted with CP - s/p cath with DES           Action/Plan:  PTA independent from home with husband.  CM provided pt free 30 day Brilinta card - CM contacted pharmacy of choice [facility] on [address] in [place] and pharmacy can fill prescription at discharge.  CM submitted benefit check  Update - CM informed pt that copay will be $30.00 - prior Josem Kaufmann is not needed   Expected Discharge Date:                  Expected Discharge Plan:  Home/Self Care  In-House Referral:     Discharge planning Services  CM Consult, Medication Assistance  Post Acute Care Choice:    Choice offered to:     DME Arranged:    DME Agency:     HH Arranged:    HH Agency:     Status of Service:     If discussed at [employer], dates discussed:    Additional Comments:  Maryclare Labrador, RN 01/02/2017, 10:13 AM

## 2017-01-02 NOTE — Progress Notes (Signed)
  Echocardiogram 2D Echocardiogram has been performed.  [name] 01/02/2017, 11:55 AM

## 2017-01-03 ENCOUNTER — Telehealth: Payer: Self-pay | Admitting: Interventional Cardiology

## 2017-01-03 LAB — URINALYSIS, ROUTINE W REFLEX MICROSCOPIC
Bilirubin Urine: NEGATIVE
GLUCOSE, UA: NEGATIVE mg/dL
KETONES UR: NEGATIVE mg/dL
NITRITE: NEGATIVE
PH: 7 (ref 5.0–8.0)
PROTEIN: NEGATIVE mg/dL
Specific Gravity, Urine: 1.006 (ref 1.005–1.030)

## 2017-01-03 LAB — CBC
HEMATOCRIT: 36.2 % (ref 36.0–46.0)
HEMOGLOBIN: 12.2 g/dL (ref 12.0–15.0)
MCH: 32.7 pg (ref 26.0–34.0)
MCHC: 33.7 g/dL (ref 30.0–36.0)
MCV: 97.1 fL (ref 78.0–100.0)
Platelets: 274 10*3/uL (ref 150–400)
RBC: 3.73 MIL/uL — ABNORMAL LOW (ref 3.87–5.11)
RDW: 14.3 % (ref 11.5–15.5)
WBC: 10 10*3/uL (ref 4.0–10.5)

## 2017-01-03 LAB — BASIC METABOLIC PANEL
Anion gap: 8 (ref 5–15)
BUN: 8 mg/dL (ref 6–20)
CALCIUM: 9 mg/dL (ref 8.9–10.3)
CHLORIDE: 100 mmol/L — AB (ref 101–111)
CO2: 29 mmol/L (ref 22–32)
CREATININE: 0.87 mg/dL (ref 0.44–1.00)
GFR calc non Af Amer: >60 mL/min (ref >60)
Glucose, Bld: 96 mg/dL (ref 65–99)
Potassium: 3.9 mmol/L (ref 3.5–5.1)
SODIUM: 137 mmol/L (ref 135–145)

## 2017-01-03 LAB — HEMOGLOBIN A1C
HEMOGLOBIN A1C: 4.9 % (ref 4.8–5.6)
Mean Plasma Glucose: 93.93 mg/dL

## 2017-01-03 LAB — LIPID PANEL
Cholesterol: 189 mg/dL (ref 0–200)
HDL: 67 mg/dL (ref >40)
LDL CALC: 105 mg/dL — AB (ref 0–99)
Total CHOL/HDL Ratio: 2.8 RATIO
Triglycerides: 83 mg/dL (ref <150)
VLDL: 17 mg/dL (ref 0–40)

## 2017-01-03 MED ORDER — NITROFURANTOIN MONOHYD MACRO 100 MG PO CAPS: 100.0000 mg | ORAL_CAPSULE | Freq: Two times a day (BID) | ORAL | 0 refills | Status: DC

## 2017-01-03 MED ORDER — METOPROLOL SUCCINATE ER 25 MG PO TB24: 12.5000 mg | ORAL_TABLET | Freq: Every day | ORAL | 0 refills | Status: DC

## 2017-01-03 MED ORDER — ATORVASTATIN CALCIUM 40 MG PO TABS: 40.0000 mg | ORAL_TABLET | Freq: Every day | ORAL | 0 refills | Status: DC

## 2017-01-03 MED ORDER — ASPIRIN 81 MG PO TBEC: 81.0000 mg | DELAYED_RELEASE_TABLET | Freq: Every day | ORAL | 0 refills | Status: DC

## 2017-01-03 MED ORDER — TICAGRELOR 90 MG PO TABS: 90.0000 mg | ORAL_TABLET | Freq: Two times a day (BID) | ORAL | 0 refills | Status: DC

## 2017-01-03 NOTE — Discharge Summary (Signed)
Physician Discharge Summary  JINNIE ONLEY TKW:[id number] DOB: [birth year] DOA: 12/31/2016  PCP: Seward Carol, MD  Admit date: 12/31/2016 Discharge date: 01/03/2017  Admitted From: Home Disposition: Home   Recommendations for Outpatient Follow-up:  1. Follow up with PCP in 1-2 weeks 2. Follow up with cardiology s/p DES x4, started on aspirin and brilinta x12 months. Also started lipitor 40mg , monitor for tolerance. 3. Please obtain BMP/CBC in one week 4. Monitor HR, BP. Started metoprolol, holding HCTZ.   Home Health: None Equipment/Devices: None Discharge Condition: Stable CODE STATUS: Full Diet recommendation: Heart healthy  Brief/Interim Summary: [name] is a 71 y.o. female with a history of right breast CA s/p lumpectomy and chemoradiation, depression and anxiety, fibromyalgia, HTN, and hypothyroidism who presented to the [facility]ED for substernal chest pain radiating to the right neck and arm described as "vicelike," first noticed while seated watching TV 9/3. This was not improved with maalox or PPI. She had been having increasing epigastric burning discomfort, different from this pain, the past week for which her PCP recommended double her PPI which improved that symptom. She's had exertional dyspnea during this time as well.   On arrival to the ED she was in no respiratory distress. D-dimer was negative, troponin negative, and ECG demonstrated anterior ST depressions. CXR with hyperinflation without other abnormality. Pain was improved with morphine and NTG. EDP discussed with cardiology who recommended heparin infusion and transfer to [facility]. Troponin became positive at 0.44 overnight. Cardiac catheterization was performed 9/4 showing severe multivessel disease. Decision was made to perform multivessel PCI in lieu of CABG, now s/p DES x4. She has worked with cardiac rehabilitation and is stable for discharge.   Discharge Diagnoses:  Principal Problem:   Chest pain Active  Problems:   Malignant neoplasm of upper-outer quadrant of right breast in female, estrogen receptor positive ([place])   Depression with anxiety   Hyponatremia   Chronic pain   Essential hypertension   Non-ST elevation (NSTEMI) myocardial infarction [facility])   Status post coronary artery stent placement  NSTEMI, CAD s/p 4 stents 9/4 by Dr. Irish Lack: Echocardiogram showed normal LV function, mild diastolic dysfunction (see below). Appears euvolemic. - Cardiac rehab phase II - Augment to higher intensity statin (lipitor 40mg  > 80mg ) based on LDL >100. HbA1c 4.9%. - Aspirin 81mg  + brilinta x12 months - Started metoprolol 12.5mg  daily, monitor HR (intermittently bradycardic) and BP.  Right breast CA: s/p lumpectomy, chemoradiation.  - Dr. Jana Hakim paid a social visit to the patient 9/5. - Restart arimidex   Essential HTN: BP controlled - Held HCTZ due to hyponatremia, still intermittently low BP. BP's remaining low, so will recommend holding until PCP and/or cardiology follow up.  - Prn hydralazine  Fibromyalgia: Stable.  - Continue home medications including vicodin  Depression/anxiety: Stable. - Continue home wellbutrin, prn valium and prn vistaril  Hypothyroidism: TSH wnl at 1.325 - Continue synthroid  Dysuria: ?interstitial cystitis vs. UTI. Previous UA was negative.  - Repeat UA with culture. Will hold on empiric therapy. Has a history of ESBL E. coli. If culture positive, would trial fosfomycin.   Discharge Instructions Discharge Instructions    Amb Referral to Cardiac Rehabilitation    Complete by:  As directed    Diagnosis:   NSTEMI Coronary Stents     Diet - low sodium heart healthy    Complete by:  As directed    Discharge instructions    Complete by:  As directed    - Start taking aspirin,  brilinta, lipitor, and metoprolol as directed - You will be contacted to follow up with cardiology, if you don't hear from them, give them a call in the next week.  - Schedule a  follow up appointment with Dr. Delfina Redwood in the next weeks or two.  - Stop taking triamterene-HCTZ until you follow up with your doctor. If you notice swelling or trouble breathing, call them immediately for advice. - If your chest pain gets worse, seek medical attention right away. - We are checking your urine for signs of infection. I will contact you if we need to start an antibiotic. The last E. coli UTI you had was extremely resistant to oral antibiotics, leaving only macrobid as an option.   Increase activity slowly    Complete by:  As directed      Allergies as of 01/03/2017      Reactions   Penicillins Other (See Comments)   Has patient had a PCN reaction causing immediate rash, facial/tongue/throat swelling, SOB or lightheadedness with hypotension: No Has patient had a PCN reaction causing severe rash involving mucus membranes or skin necrosis: No Has patient had a PCN reaction that required hospitalization No Has patient had a PCN reaction occurring within the last 10 years: No If all of the above answers are "NO", then may proceed with Cephalosporin use.   Chlorhexidine Other (See Comments)   [place] IS OK. The CLOTH WIPE causes reaction. Causes itching all over the area   Other Rash   Adhesive tape      Medication List    STOP taking these medications   DYAZIDE 37.5-25 MG capsule Generic drug:  triamterene-hydrochlorothiazide     TAKE these medications   albuterol 108 (90 Base) MCG/ACT inhaler Commonly known as:  PROVENTIL HFA;VENTOLIN HFA Inhale 1-2 puffs into the lungs every 6 (six) hours as needed for wheezing or shortness of breath. Started in 1998   anastrozole 1 MG tablet Commonly known as:  ARIMIDEX Take 1 tablet (1 mg total) by mouth daily.   aspirin 81 MG EC tablet Take 1 tablet (81 mg total) by mouth daily.   atorvastatin 40 MG tablet Commonly known as:  LIPITOR Take 1 tablet (40 mg total) by mouth daily at 6 PM.   buPROPion 300 MG 24 hr tablet Commonly  known as:  WELLBUTRIN XL Take 300 mg by mouth daily.   CALCIUM + D PO Take 1 tablet by mouth at bedtime. Started approximately 1998   carisoprodol 350 MG tablet Commonly known as:  SOMA Take 350 mg by mouth 2 (two) times daily as needed for muscle spasms. Started in 2008.   cycloSPORINE 0.05 % ophthalmic emulsion Commonly known as:  RESTASIS Place 1 drop into both eyes 2 (two) times daily. What changed:  when to take this  reasons to take this   fluconazole 100 MG tablet Commonly known as:  DIFLUCAN Take 1 tablet (100 mg total) by mouth daily.   HYDROcodone-acetaminophen 10-325 MG tablet Commonly known as:  NORCO Take 1 tablet by mouth every 4 (four) hours as needed for moderate pain or severe pain (as needed for fibromyalgia and IC).   hydrOXYzine 25 MG tablet Commonly known as:  ATARAX/VISTARIL Take 1 tablet (25 mg total) by mouth 3 (three) times daily as needed for anxiety or itching.   LORazepam 2 MG tablet Commonly known as:  ATIVAN Take 1 tablet by mouth daily as needed for anxiety.   metoprolol succinate 25 MG 24 hr tablet Commonly known as:  TOPROL-XL Take 0.5 tablets (12.5 mg total) by mouth daily.   minoxidil 2 % external solution Commonly known as:  ROGAINE Apply topically 2 (two) times daily.   multivitamin capsule Take 1 capsule by mouth daily. Taking since at least 1988.   NONFORMULARY OR COMPOUNDED ITEM Place 1 suppository vaginally daily. compounded medication - vitamin E   ondansetron 4 MG tablet Commonly known as:  ZOFRAN Take 1 tablet by mouth daily as needed for nausea or vomiting.   pantoprazole 40 MG tablet Commonly known as:  PROTONIX Take 40 mg by mouth 2 (two) times daily. Started in 2012.   prochlorperazine 10 MG tablet Commonly known as:  COMPAZINE Take 1 tablet (10 mg total) by mouth every 6 (six) hours as needed (Nausea or vomiting).   SYNTHROID 137 MCG tablet Generic drug:  levothyroxine Take 137 mcg by mouth daily.    terconazole 0.4 % vaginal cream Commonly known as:  TERAZOL 7 Place 1 applicator vaginally at bedtime.   ticagrelor 90 MG Tabs tablet Commonly known as:  BRILINTA Take 1 tablet (90 mg total) by mouth 2 (two) times daily.   VALIUM 5 MG tablet Generic drug:  diazepam Take 5 mg by mouth every 6 (six) hours as needed for muscle spasms (interstitial cystitis). Started approximately 2008.   Vitamin D (Ergocalciferol) 50000 units Caps capsule Commonly known as:  DRISDOL Take 1 capsule (50,000 Units total) by mouth every 7 (seven) days.   zolpidem 10 MG tablet Commonly known as:  AMBIEN Take 10 mg by mouth at bedtime as needed for sleep.            Discharge Care Instructions        Start     Ordered   01/04/17 0000  aspirin EC 81 MG EC tablet  Daily     01/03/17 1040   01/04/17 0000  metoprolol succinate (TOPROL-XL) 25 MG 24 hr tablet  Daily     01/03/17 1040   01/03/17 0000  atorvastatin (LIPITOR) 40 MG tablet  Daily-1800     01/03/17 1040   01/03/17 0000  ticagrelor (BRILINTA) 90 MG TABS tablet  2 times daily     01/03/17 1040   01/03/17 0000  Increase activity slowly     01/03/17 1040   01/03/17 0000  Diet - low sodium heart healthy     01/03/17 1040   01/03/17 0000  Discharge instructions    Comments:  - Start taking aspirin, brilinta, lipitor, and metoprolol as directed - You will be contacted to follow up with cardiology, if you don't hear from them, give them a call in the next week.  - Schedule a follow up appointment with Dr. Delfina Redwood in the next weeks or two.  - Stop taking triamterene-HCTZ until you follow up with your doctor. If you notice swelling or trouble breathing, call them immediately for advice. - If your chest pain gets worse, seek medical attention right away. - We are checking your urine for signs of infection. I will contact you if we need to start an antibiotic. The last E. coli UTI you had was extremely resistant to oral antibiotics, leaving only  macrobid as an option.   01/03/17 1042   01/02/17 0000  Amb Referral to Cardiac Rehabilitation    Question Answer Comment  Diagnosis: NSTEMI   Diagnosis: Coronary Stents      01/02/17 1034     Follow-up Information    Seward Carol, MD. Schedule an appointment as soon as possible  for a visit in 1 week(s).   Specialty:  Internal Medicine Contact information: [address] [place] [address] [phone]        Belva Crome, MD. Call in 2 week(s).   Specialty:  Cardiology Contact information: [address] [place] [address] [phone]          Allergies  Allergen Reactions  . Penicillins Other (See Comments)    Has patient had a PCN reaction causing immediate rash, facial/tongue/throat swelling, SOB or lightheadedness with hypotension: No Has patient had a PCN reaction causing severe rash involving mucus membranes or skin necrosis: No Has patient had a PCN reaction that required hospitalization No Has patient had a PCN reaction occurring within the last 10 years: No If all of the above answers are "NO", then may proceed with Cephalosporin use.  . Chlorhexidine Other (See Comments)    [place] IS OK. The CLOTH WIPE causes reaction. Causes itching all over the area  . Other Rash    Adhesive tape    Consultations:  Cardiology  Procedures/Studies: Dg Chest 2 View  Result Date: 12/31/2016 CLINICAL DATA:  Chest pain difficulty breathing EXAM: CHEST  2 VIEW COMPARISON:  12/15/2015, CT chest 11/28/2016 FINDINGS: Hyperinflation. Linear atelectasis or scarring at both lung bases. No consolidation or effusion. Stable cardiomediastinal silhouette. No pneumothorax. Postsurgical changes of both breasts. IMPRESSION: Hyperinflation with linear scarring or atelectasis at the bases. Negative for edema, infiltrate or pneumothorax. Electronically Signed   By: Donavan Foil M.D.   On: 12/31/2016 18:44   CARDIAC CATHETERIZATION 01/01/2017:  The  left ventricular systolic function is normal.  LV end diastolic pressure is normal.  The left ventricular ejection fraction is 55-65% by visual estimate.  There is no aortic valve stenosis.  Prox Cx to Mid Cx lesion, 95 %stenosed.  A STENT PROMUS PREM MR 2.75X16 drug eluting stent was successfully placed, postdilated to >54mm.  Post intervention, there is a 0% residual stenosis.  Mid LAD-2 lesion, 80 %stenosed.  A STENT PROMUS PREM MR 4.09B35 drug eluting stent was successfully placed, postdilated to >29mm.  Post intervention, there is a 0% residual stenosis.  Mid RCA lesion, 90 %stenosed.  A STENT PROMUS PREM MR 3.0X20 drug eluting stent was successfully placed, postdilated to 3.3 mm.  Post intervention, there is a 0% residual stenosis.  Dist RCA-2 lesion, 70 %stenosed.  A STENT PROMUS PREM MR 2.5X12 drug eluting stent was successfully placed.  Post intervention, there is a 10% residual stenosis.  Severe three vessel disease. Given the patient's other comorbidities including multiple surgeries for breast cancer, she was adament that if PCI could be done she wanted to avoid CABG. She is somewhat frail, having lost 40 lbs with chemotherapy. After discussion with Dr. Tamala Julian, we proceded with multivessel PCI.   Successful DES placement to the mid LAD, mid circumflex, mid and distal RCA with short stents.  Continue DAPT for at least 12 months along with aggressive secondary prevention.   ECHOCARDIOGRAM 01/02/2017: Study Conclusions - Left ventricle: The cavity size was normal. Wall thickness was   normal. Systolic function was normal. The estimated ejection   fraction was in the range of 60% to 65%. Wall motion was normal;   there were no regional wall motion abnormalities. Doppler   parameters are consistent with abnormal left ventricular   relaxation (grade 1 diastolic dysfunction).  Impressions: - Normal LV systolic function; mild diastolic dysfunction; strain   not  performed.  Subjective: Developing  dysuria, not sure if this is a flare of her interstitial cystitis or UTI, but also vaginal irritation consistent with prior yeast infections. Took diflucan this AM.   Discharge Exam: Vitals:   01/03/17 0451 01/03/17 0906  BP: (!) 111/45 (!) 115/59  Pulse: 64 66  Resp: 16   Temp: 97.9 F (36.6 C)   SpO2: 98%    General: Pt is alert, awake, not in acute distress Cardiovascular: RRR, S1/S2 +, no rubs, no gallops Respiratory: CTA bilaterally, no wheezing, no rhonchi Abdominal: Soft, NT, ND, bowel sounds + Extremities: No edema, no cyanosis  Labs: Basic Metabolic Panel:  Recent Labs Lab 12/31/16 1823 01/02/17 0233 01/03/17 0402  NA 133* 135 137  K 3.8 3.7 3.9  CL 96* 105 100*  CO2 26 22 29   GLUCOSE 112* 85 96  BUN 9 6 8   CREATININE 0.74 0.81 0.87  CALCIUM 9.2 8.4* 9.0   CBC:  Recent Labs Lab 12/31/16 1823 01/01/17 0156 01/02/17 0233 01/03/17 0402  WBC 7.5 9.7 7.7 10.0  HGB 14.1 13.4 12.6 12.2  HCT 40.0 39.6 36.4 36.2  MCV 93.9 95.4 96.0 97.1  PLT 324 293 258 274   Cardiac Enzymes:  Recent Labs Lab 12/31/16 1823 01/01/17 0205 01/01/17 0759  TROPONINI <0.03 0.44* 0.29*   D-Dimer  Recent Labs  12/31/16 1823  DDIMER <0.27   Hgb A1c  Recent Labs  01/03/17 0402  HGBA1C 4.9   Lipid Profile  Recent Labs  01/02/17 1916 01/03/17 0402  CHOL 224* 189  HDL 77 67  LDLCALC 128* 105*  TRIG 93 83  CHOLHDL 2.9 2.8   Thyroid function studies  Recent Labs  01/02/17 0233  TSH 1.325   Urinalysis    Component Value Date/Time   COLORURINE STRAW (A) 01/01/2017 1849   APPEARANCEUR CLEAR 01/01/2017 1849   LABSPEC 1.042 (H) 01/01/2017 1849   PHURINE 6.0 01/01/2017 1849   GLUCOSEU NEGATIVE 01/01/2017 1849   HGBUR NEGATIVE 01/01/2017 1849   BILIRUBINUR NEGATIVE 01/01/2017 1849   BILIRUBINUR n 10/01/2016 1316   KETONESUR NEGATIVE 01/01/2017 1849   PROTEINUR NEGATIVE 01/01/2017 1849   UROBILINOGEN 0.2 10/01/2016  1316   NITRITE NEGATIVE 01/01/2017 1849   LEUKOCYTESUR TRACE (A) 01/01/2017 1849    Microbiology Recent Results (from the past 240 hour(s))  MRSA PCR Screening     Status: None   Collection Time: 01/01/17  3:52 AM  Result Value Ref Range Status   MRSA by PCR NEGATIVE NEGATIVE Final    Comment:        The GeneXpert MRSA Assay (FDA approved for NASAL specimens only), is one component of a comprehensive MRSA colonization surveillance program. It is not intended to diagnose MRSA infection nor to guide or monitor treatment for MRSA infections.     Time coordinating discharge: Approximately 40 minutes  Vance Gather, MD  [facility] 01/03/2017, 10:42 AM Pager [phone]

## 2017-01-03 NOTE — Progress Notes (Signed)
The patient has been given discharge instructions along with a new medication list and what to take today. She has follow up appointments and prescriptions to pick up. She is discharging with her husband via car.  Saddie Benders RN

## 2017-01-03 NOTE — Progress Notes (Signed)
[place] to see pt to walk. Had just walked with [facility] team. Assisted to bathroom. Pt had no questions re ed done yesterday. Graylon Good RN BSN 01/03/2017 12:14 PM

## 2017-01-03 NOTE — Consult Note (Signed)
[facility] Primary Care Navigator  01/03/2017  [name] [birth year] [id number]    Met with patient at the bedside to identify possible discharge needs.  Patient reports having "stabbing and radiating chest pain" that had led to this admission. Patientconfirmed Dr. Seward Carol with [facility] at [facility] as the primary care provider.   Patient shared using [facility] pharmacy on [address] on [address] to obtain medications without difficulty.   She verbalizedmanaging her medicationsat home with husband's assistance using "own organizing system".  Patient reports that she was driving prior to admission but husband [name]) will be providingtransportation to herdoctors'appointments after discharge.  Patient lives with husband who will be providing assistance with her care needs at home.  Anticipated discharge plan is home according to patient.  Patientvoiced understanding to callprimary care provider's officewhen shegets hometo schedulea post hospitalfollow-upvisit within a week or sooner if needed. Patient letter (with PCP's contact number) was provided as a reminder.  Explained to patient regarding[facility] CM services available for her but she denied any current needs or concerns at this time, however, she opted and verbally agreed to [facility] follow-up calls to monitor her recovery at home.   Referral made for [facility] General calls after discharge.   Patient statedunderstandingto seek referral from primary care provider to [facility] care management if deemed necessaryfor services in the future.  [facility] care management information provided for future needs that may arise.   For questions, please contact:  Dannielle Huh, BSN, RN- [facility] Primary Care Navigator  Telephone: [phone] [place]

## 2017-01-03 NOTE — Telephone Encounter (Signed)
New message    TOC appt made per staff message on 9/21 at 230pm with Cecilie Kicks.

## 2017-01-03 NOTE — Progress Notes (Signed)
Progress Note  Patient Name: [name] Date of Encounter: 01/03/2017  Primary Cardiologist: Linard Millers  Subjective   Ambulated without difficulty.  Inpatient Medications    Scheduled Meds: . aspirin EC  81 mg Oral Daily  . atorvastatin  40 mg Oral q1800  . buPROPion  300 mg Oral Daily  . clotrimazole  1 Applicatorful Vaginal QHS  . cycloSPORINE  1 drop Both Eyes BID  . fluconazole  100 mg Oral Daily  . levothyroxine  137 mcg Oral QAC breakfast  . metoprolol succinate  12.5 mg Oral Daily  . multivitamin with minerals  1 tablet Oral Daily  . pantoprazole  40 mg Oral Daily  . sodium chloride flush  3 mL Intravenous Q12H  . ticagrelor  90 mg Oral BID   Continuous Infusions: . sodium chloride     PRN Meds: sodium chloride, acetaminophen, albuterol, alum & mag hydroxide-simeth, carisoprodol, diazepam, HYDROcodone-acetaminophen, hydrOXYzine, morphine injection, nitroGLYCERIN, ondansetron (ZOFRAN) IV, ondansetron, polyvinyl alcohol, prochlorperazine, sodium chloride flush, zolpidem   Vital Signs    Vitals:   01/02/17 1156 01/02/17 2011 01/03/17 0451 01/03/17 0906  BP: 113/71 (!) 120/51 (!) 111/45 (!) 115/59  Pulse: 60 66 64 66  Resp: (!) 21 17 16    Temp: 98.1 F (36.7 C) 98 F (36.7 C) 97.9 F (36.6 C)   TempSrc: Oral     SpO2: 97% 98% 98%   Weight:   142 lb 14.4 oz (64.8 kg)   Height:        Intake/Output Summary (Last 24 hours) at 01/03/17 1244 Last data filed at 01/03/17 0403  Gross per 24 hour  Intake              120 ml  Output              350 ml  Net             -230 ml   Filed Weights   01/01/17 0300 01/02/17 0436 01/03/17 0451  Weight: 148 lb 4.8 oz (67.3 kg) 143 lb (64.9 kg) 142 lb 14.4 oz (64.8 kg)    Telemetry    Sinus rhythm - Personally Reviewed  ECG    Nonspecific ST-T wave change - Personally Reviewed  Physical Exam  Thinning hair but otherwise unremarkable GEN: No acute distress.   Neck: No JVD Cardiac: RRR, no murmurs, rubs, or  gallops.  Respiratory: Clear to auscultation bilaterally. GI: Soft, nontender, non-distended  MS: No edema; No deformity. Neuro:  Nonfocal  Psych: Normal affect   Labs    Chemistry Recent Labs Lab 12/31/16 1823 01/02/17 0233 01/03/17 0402  NA 133* 135 137  K 3.8 3.7 3.9  CL 96* 105 100*  CO2 26 22 29   GLUCOSE 112* 85 96  BUN 9 6 8   CREATININE 0.74 0.81 0.87  CALCIUM 9.2 8.4* 9.0  GFRNONAA >60 >60 >60  GFRAA >60 >60 >60  ANIONGAP 11 8 8      Hematology Recent Labs Lab 01/01/17 0156 01/02/17 0233 01/03/17 0402  WBC 9.7 7.7 10.0  RBC 4.15 3.79* 3.73*  HGB 13.4 12.6 12.2  HCT 39.6 36.4 36.2  MCV 95.4 96.0 97.1  MCH 32.3 33.2 32.7  MCHC 33.8 34.6 33.7  RDW 13.5 14.2 14.3  PLT 293 258 274    Cardiac Enzymes Recent Labs Lab 12/31/16 1823 01/01/17 0205 01/01/17 0759  TROPONINI <0.03 0.44* 0.29*    Recent Labs Lab 12/31/16 1834  TROPIPOC 0.00     BNPNo results  for input(s): BNP, PROBNP in the last 168 hours.   DDimer  Recent Labs Lab 12/31/16 1823  DDIMER <0.27     Radiology    No results found.  Cardiac Studies   Coronary Angio and Stent 12/2016 LAD, circumflex, and 2 right coronary DE stents.  Patient Profile     71 y.o. female with past medical history of breast CA s/p lumpectomy and chemotherapy/radiation, anxiety, fibromyalgia, hypertension, hypothyroidism and insomnia who presented to the [facility] ED with chest pain, And an catheterization was found to have three-vessel coronary disease that was treated with multivessel culprit PCI on 01/01/2017 by Dr. Irish Lack.  Assessment & Plan    1. Multivessel coronary disease, stable status post multivessel stenting. Resolution of anginal symptoms. 2. High intensity statin therapy to manage hyperlipidemia.  Plan discharge today. Signed, Sinclair Grooms, MD  01/03/2017, 12:44 PM

## 2017-01-04 ENCOUNTER — Telehealth: Payer: Self-pay

## 2017-01-04 NOTE — Telephone Encounter (Signed)
Patient contacted regarding discharge from [facility] on 01/03/2017.  Patient understands to follow up with provider Cecilie Kicks PA on 01/18/2017 at [facility] on [address]. Patient understands discharge instructions? yes  Patient understands medications and regiment? yes  Patient understands to bring all medications to this visit?    Patient states she is tired and feels a bit winded going up her stairs. Understands her medications. Wants [name] to know she was very pleased with her care at the hospital, and very impressed with the MD's and staff.

## 2017-01-05 LAB — URINE CULTURE: Culture: 40000 — AB

## 2017-01-07 NOTE — Telephone Encounter (Signed)
This has been done on Friday 01/04/2017.

## 2017-01-08 ENCOUNTER — Telehealth (HOSPITAL_COMMUNITY): Payer: Self-pay

## 2017-01-08 NOTE — Telephone Encounter (Signed)
Patient insurances are active and benefits verified. Patient insurances are Medicare A/B and [place] A/B - no co-payment, deductible $183.00/$183.00 has been met, 20% co-insurance and no out of pocket and no pre-authorization. Passport/reference [phone].  BCBS medicare supplement - no co-payment, no deductible, no out of pocket, no co-insurance and no pre-authorization. Passport/reference [phone].  Patient will be contacted and scheduled after their follow up appointment with the cardiologist on 01/18/17, upon review by [facility] RN navigator.

## 2017-01-15 ENCOUNTER — Other Ambulatory Visit: Payer: Medicare Other

## 2017-01-16 ENCOUNTER — Telehealth: Payer: Self-pay | Admitting: Interventional Cardiology

## 2017-01-16 NOTE — Telephone Encounter (Signed)
New Message     Pt c/o Shortness Of Breath: STAT if SOB developed within the last 24 hours or pt is noticeably SOB on the phone  1. Are you currently SOB (can you hear that pt is SOB on the phone)?  no  2. How long have you been experiencing SOB?  Since her heart attack 2 weeks ago  3. Are you SOB when sitting or when up moving around?  Moving around  4. Are you currently experiencing any other symptoms? Pt states she has indigestion and she is asthmatic having problem with her allergies , she doesn't think she is having chest pain or pressure

## 2017-01-16 NOTE — Telephone Encounter (Signed)
Would check BNP at her visit.  She knows Dr. Tamala Julian from years ago and wanted to follow with him.  He saw her on the cath table.  I am fine either way...who ever the patient wants to f/u with.

## 2017-01-16 NOTE — Telephone Encounter (Signed)
Left message for patient to call back  

## 2017-01-16 NOTE — Telephone Encounter (Signed)
Patient calling and states that she had a heart attack a couple of weeks ago and that she has been SOB on exertion since then, mainly when she walks up the stairs. She states that she is not SOB at rest. She states that she had indigestion early this morning and she took mylanta and that has resolved. She states that she does not have any chest pain or pressure and that she knows when she should go to the ER. She denies having any lightheadedness, dizziness, arm or jaw pain, palpitations, or any other symptoms. She just wanted to call and make [name] aware. Patient states that she is taking her brilinta, aspirin, atorvastatin, and metoprolol as prescribed. She does not have any vitals to offer. Patient has an appointment this Friday with Cecilie Kicks, NP. Advised patient to keep appointment and let us know if her symptoms worsen. Patient verbalized understanding and thanked me for the call.

## 2017-01-17 NOTE — Telephone Encounter (Signed)
Patient made aware that we will check a BNP when she sees Cecilie Kicks, NP tomorrow. Patient verbalized understanding and thanked me for the call. Patient will be followed by Dr. Irish Lack.

## 2017-01-18 ENCOUNTER — Ambulatory Visit (INDEPENDENT_AMBULATORY_CARE_PROVIDER_SITE_OTHER): Payer: Medicare Other | Admitting: Cardiology

## 2017-01-18 ENCOUNTER — Encounter: Payer: Self-pay | Admitting: Cardiology

## 2017-01-18 VITALS — BP 102/60 | HR 50 | Ht 63.0 in | Wt 144.8 lb

## 2017-01-18 DIAGNOSIS — I214 Non-ST elevation (NSTEMI) myocardial infarction: Secondary | ICD-10-CM

## 2017-01-18 DIAGNOSIS — I200 Unstable angina: Secondary | ICD-10-CM

## 2017-01-18 DIAGNOSIS — I10 Essential (primary) hypertension: Secondary | ICD-10-CM | POA: Diagnosis not present

## 2017-01-18 DIAGNOSIS — I2510 Atherosclerotic heart disease of native coronary artery without angina pectoris: Secondary | ICD-10-CM

## 2017-01-18 DIAGNOSIS — R0602 Shortness of breath: Secondary | ICD-10-CM

## 2017-01-18 DIAGNOSIS — E782 Mixed hyperlipidemia: Secondary | ICD-10-CM

## 2017-01-18 LAB — BASIC METABOLIC PANEL
BUN/Creatinine Ratio: 11 — ABNORMAL LOW (ref 12–28)
BUN: 10 mg/dL (ref 8–27)
CALCIUM: 9.3 mg/dL (ref 8.7–10.3)
CO2: 27 mmol/L (ref 20–29)
Chloride: 104 mmol/L (ref 96–106)
Creatinine, Ser: 0.87 mg/dL (ref 0.57–1.00)
GFR, EST AFRICAN AMERICAN: 77 mL/min/{1.73_m2} (ref >59)
GFR, EST NON AFRICAN AMERICAN: 67 mL/min/{1.73_m2} (ref >59)
Glucose: 90 mg/dL (ref 65–99)
POTASSIUM: 3.8 mmol/L (ref 3.5–5.2)
SODIUM: 141 mmol/L (ref 134–144)

## 2017-01-18 MED ORDER — NITROGLYCERIN 0.4 MG SL SUBL: 0.4000 mg | SUBLINGUAL_TABLET | SUBLINGUAL | 3 refills | Status: DC | PRN

## 2017-01-18 MED ORDER — TICAGRELOR 90 MG PO TABS: 90.0000 mg | ORAL_TABLET | Freq: Two times a day (BID) | ORAL | 6 refills | Status: DC

## 2017-01-18 MED ORDER — METOPROLOL SUCCINATE ER 25 MG PO TB24: 12.5000 mg | ORAL_TABLET | Freq: Every day | ORAL | 6 refills | Status: DC

## 2017-01-18 MED ORDER — ATORVASTATIN CALCIUM 40 MG PO TABS: 40.0000 mg | ORAL_TABLET | Freq: Every day | ORAL | 6 refills | Status: DC

## 2017-01-18 NOTE — Patient Instructions (Signed)
Medication Instructions:  Your physician recommends that you continue on your current medications as directed. Please refer to the Current Medication list given to you today.   Labwork: Lab work to be done today  --BMP, pro BNP--STAT  Testing/Procedures: none  Follow-Up: Your physician recommends that you schedule a follow-up appointment in: 3 weeks.  Scheduled for October 15,2018 at 12:45 with Ermalinda Barrios, PA    Any Other Special Instructions Will Be Listed Below (If Applicable).   Call our office if shortness of breath continues.  Call if diarrhea does not improve.   If you need a refill on your cardiac medications before your next appointment, please call your pharmacy.

## 2017-01-18 NOTE — Progress Notes (Signed)
Cardiology Office Note   Date:  01/20/2017   ID:  [name], DOB 11/17/1945, MRN [id number]  PCP:  Seward Carol, MD  Cardiologist:  Dr. Irish Lack      Chief Complaint  Patient presents with  . Hospitalization Follow-up      History of Present Illness: [name] is a 71 y.o. female who presents for post hospital for multivessel CAD s/p PCI X 4 stents after presenting for chest pain.  Pk troponin 0.44.  D/c'd 01/03/17   She has a past medical history of breast CA s/p lumpectomy and chemotherapy/radiation, anxiety, fibromyalgia, hypertension, hypothyroidism and insomnia who presented to the [facility] ED with chest pain, And an catheterization was found to have three-vessel coronary disease that was treated with multivessel culprit PCI on 01/01/2017 by Dr. Irish Lack. See below.  Today she has no chest pain but she does have SOB episodically.  Does improve on it's own, other times improved with inhaler.  She is on Brilinta and SOB is a side effect she will try a coke, small amount to see if it helps.  Will check BNP.  02 92% today.  She is eating healthy and taking her medications.             Past Medical History:  Diagnosis Date  . Anxiety   . Arthritis 2008  . Asthma 1953   age 20  . Cancer [facility])    breast cancer  . Chronic vaginitis 1998  . Fibromyalgia 1982  . GERD (gastroesophageal reflux disease)    since at least 2011  . Hemorrhoids 2009  . History of radiation therapy 01/18/16-03/05/16   right breast/nodal areas 45 Gy, right breast boost 16 Gy  . Hypoglycemia 2003  . Hypothyroidism 1964   age 39  . Interstitial cystitis 1998  . Irritable bowel syndrome 06/14/2009   Qualifier: Diagnosis of  By: Patsy Baltimore RN, Langley Gauss    . Menopausal symptoms   . Osteoporosis 12/24/2006  . Pneumonia 1979   varicella  . PONV (postoperative nausea and vomiting) 1979    Past Surgical History:  Procedure Laterality Date  . BLADDER HYPEREXTENSION     X4, prior to 01/2006  .  BLADDER SUSPENSION    . BREAST LUMPECTOMY WITH NEEDLE LOCALIZATION AND AXILLARY LYMPH NODE DISSECTION Right 12/05/2015   Procedure: RIGHT BREAST NEEDLE LOCALIZED (WIRES X 2) LUMPECTOMY WITH AXILLARY LYMPH NODE DISSECTION;  Surgeon: Fanny Skates, MD;  Location: [place];  Service: General;  Laterality: Right;  . BREAST LUMPECTOMY WITH RADIOACTIVE SEED LOCALIZATION Left 12/05/2015   Procedure: RADIOACTIVE SEED GUIDED LEFT BREAST LUMPECTOMY;  Surgeon: Fanny Skates, MD;  Location: [place];  Service: General;  Laterality: Left;  . BREAST SURGERY    . BUNIONECTOMY     prior to 01/2006  . CESAREAN SECTION  1979  . CHOLECYSTECTOMY  02/08/2006   DR. INGRAM  . COLONOSCOPY    . CORONARY STENT INTERVENTION N/A 01/01/2017   Procedure: CORONARY STENT INTERVENTION;  Surgeon: Jettie Booze, MD;  Location: [place] [facility];  Service: Cardiovascular;  Laterality: N/A;  . DENTAL SURGERY  2009-2016   13 dental implants  . LEFT HEART CATH AND CORONARY ANGIOGRAPHY N/A 01/01/2017   Procedure: LEFT HEART CATH AND CORONARY ANGIOGRAPHY;  Surgeon: Jettie Booze, MD;  Location: [place] [facility];  Service: Cardiovascular;  Laterality: N/A;  . LEG SURGERY  07/06/2004   Broken tibia  . NASAL SINUS SURGERY     X4; prior to 01/2006  .  PORT-A-CATH REMOVAL Right 12/05/2015   Procedure: REMOVAL PORT-A-CATH;  Surgeon: Fanny Skates, MD;  Location: [place];  Service: General;  Laterality: Right;  . PORTACATH PLACEMENT Left 09/08/2015   Procedure: INSERTION OF PORT-A-CATH ;  Surgeon: Fanny Skates, MD;  Location: [place];  Service: General;  Laterality: Left;     Current Outpatient Prescriptions  Medication Sig Dispense Refill  . albuterol (PROVENTIL HFA;VENTOLIN HFA) 108 (90 Base) MCG/ACT inhaler Inhale 1-2 puffs into the lungs every 6 (six) hours as needed for wheezing or shortness of breath. Started in 1998    . anastrozole (ARIMIDEX) 1 MG tablet Take 1 tablet (1 mg total) by mouth daily. 90 tablet 4  . aspirin EC  81 MG EC tablet Take 1 tablet (81 mg total) by mouth daily. 30 tablet 0  . atorvastatin (LIPITOR) 40 MG tablet Take 1 tablet (40 mg total) by mouth daily at 6 PM. 30 tablet 6  . buPROPion (WELLBUTRIN XL) 300 MG 24 hr tablet Take 300 mg by mouth daily.     . Calcium Carbonate-Vitamin D (CALCIUM + D PO) Take 1 tablet by mouth at bedtime. Started approximately 1998    . carisoprodol (SOMA) 350 MG tablet Take 350 mg by mouth 2 (two) times daily as needed for muscle spasms. Started in 2008.  2  . cycloSPORINE (RESTASIS) 0.05 % ophthalmic emulsion Place 1 drop into both eyes 2 (two) times daily. 5.5 mL 3  . diazepam (VALIUM) 5 MG tablet Take 5 mg by mouth every 6 (six) hours as needed for muscle spasms (interstitial cystitis). Started approximately 2008.    . fluconazole (DIFLUCAN) 100 MG tablet Take 1 tablet (100 mg total) by mouth daily. 30 tablet 3  . HYDROcodone-acetaminophen (NORCO) 10-325 MG tablet Take 1 tablet by mouth every 4 (four) hours as needed for moderate pain or severe pain (as needed for fibromyalgia and IC).     . hydrOXYzine (ATARAX/VISTARIL) 25 MG tablet Take 1 tablet (25 mg total) by mouth 3 (three) times daily as needed for anxiety or itching. 30 tablet 0  . hydrOXYzine (VISTARIL) 25 MG capsule Take 25 mg by mouth 2 (two) times daily.    Marland Kitchen levothyroxine (SYNTHROID) 137 MCG tablet Take 137 mcg by mouth daily.     Marland Kitchen LORazepam (ATIVAN) 2 MG tablet Take 1 tablet by mouth daily as needed for anxiety.   5  . metoprolol succinate (TOPROL-XL) 25 MG 24 hr tablet Take 0.5 tablets (12.5 mg total) by mouth daily. 15 tablet 6  . minoxidil (ROGAINE) 2 % external solution Apply topically 2 (two) times daily.    . Multiple Vitamin (MULTIVITAMIN) capsule Take 1 capsule by mouth daily. Taking since at least 1988.    . NONFORMULARY OR COMPOUNDED ITEM Place 1 suppository vaginally daily. compounded medication - vitamin E    . ondansetron (ZOFRAN) 4 MG tablet Take 1 tablet by mouth daily as needed for  nausea or vomiting.   3  . pantoprazole (PROTONIX) 40 MG tablet Take 40 mg by mouth 2 (two) times daily. Started in 2012.    . prochlorperazine (COMPAZINE) 10 MG tablet Take 1 tablet (10 mg total) by mouth every 6 (six) hours as needed (Nausea or vomiting). 30 tablet 1  . terconazole (TERAZOL 7) 0.4 % vaginal cream Place 1 applicator vaginally 2 (two) times daily as needed (irritation).     . ticagrelor (BRILINTA) 90 MG TABS tablet Take 1 tablet (90 mg total) by mouth 2 (two) times daily. 60 tablet  6  . Vitamin D, Ergocalciferol, (DRISDOL) 50000 units CAPS capsule Take 1 capsule (50,000 Units total) by mouth every 7 (seven) days. 12 capsule 3  . zolpidem (AMBIEN) 10 MG tablet Take 10 mg by mouth at bedtime as needed for sleep.     . nitroGLYCERIN (NITROSTAT) 0.4 MG SL tablet Place 1 tablet (0.4 mg total) under the tongue every 5 (five) minutes as needed for chest pain. 25 tablet 3   No current facility-administered medications for this visit.     Allergies:   Penicillins; Chlorhexidine; and Other    Social History:  The patient  reports that she has quit smoking. Her smoking use included Cigarettes. She has a 3.75 pack-year smoking history. She has never used smokeless tobacco. She reports that she drinks alcohol. She reports that she does not use drugs.   Family History:  The patient's family history includes Breast cancer (age of onset: 91) in her sister; Breast cancer (age of onset: 61) in her other; Cancer in her sister; Heart attack in her maternal grandfather; Heart disease in her mother, paternal grandmother, and sister; Hypertension in her mother; Kidney cancer (age of onset: 73) in her paternal uncle; Kidney failure (age of onset: 38) in her maternal grandmother; Melanoma in her sister; Parkinsonism in her father, paternal aunt, and paternal uncle.    ROS:  General:no colds or fevers, no weight changes Skin:no rashes or ulcers HEENT:no blurred vision, no congestion CV:see HPI PUL:see  HPI GI:no diarrhea constipation or melena, no indigestion GU:no hematuria, no dysuria MS:no joint pain, no claudication Neuro:no syncope, no lightheadedness Endo:no diabetes, + thyroid disease  Wt Readings from Last 3 Encounters:  01/18/17 144 lb 12.8 oz (65.7 kg)  01/03/17 142 lb 14.4 oz (64.8 kg)  12/03/16 142 lb (64.4 kg)     PHYSICAL EXAM: VS:  BP 102/60   Pulse (!) 50   Ht 5\' 3"  (1.6 m)   Wt 144 lb 12.8 oz (65.7 kg)   LMP 04/30/1996 (Approximate)   SpO2 92%   BMI 25.65 kg/m  , BMI Body mass index is 25.65 kg/m. General:Pleasant affect, NAD Skin:Warm and dry, brisk capillary refill HEENT:normocephalic, sclera clear, mucus membranes moist Neck:supple, no JVD, no bruits  Heart:S1S2 RRR without murmur, gallup, rub or click Lungs:clear without rales, rhonchi, or wheezes KXF:GHWE, non tender, + BS, do not palpate liver spleen or masses Ext:no lower ext edema, 2+ pedal pulses, 2+ radial pulses Neuro:alert and oriented, MAE, follows commands, + facial symmetry    EKG:  EKG is NOT ordered today.   Recent Labs: 11/28/2016: ALT 10 01/02/2017: TSH 1.325 01/03/2017: Hemoglobin 12.2; Platelets 274 01/18/2017: BUN 10; Creatinine, Ser 0.87; NT-Pro BNP 415; Potassium 3.8; Sodium 141    Lipid Panel    Component Value Date/Time   CHOL 189 01/03/2017 0402   TRIG 83 01/03/2017 0402   HDL 67 01/03/2017 0402   CHOLHDL 2.8 01/03/2017 0402   VLDL 17 01/03/2017 0402   LDLCALC 105 (H) 01/03/2017 0402       Other studies Reviewed: Additional studies/ records that were reviewed today include: . 01/02/17 Echo Study Conclusions  - Left ventricle: The cavity size was normal. Wall thickness was   normal. Systolic function was normal. The estimated ejection   fraction was in the range of 60% to 65%. Wall motion was normal;   there were no regional wall motion abnormalities. Doppler   parameters are consistent with abnormal left ventricular   relaxation (grade 1 diastolic  dysfunction).  Impressions:  - Normal LV systolic function; mild diastolic dysfunction; strain   not performed.   Procedures   CORONARY STENT INTERVENTION  LEFT HEART CATH AND CORONARY ANGIOGRAPHY  Conclusion     The left ventricular systolic function is normal.  LV end diastolic pressure is normal.  The left ventricular ejection fraction is 55-65% by visual estimate.  There is no aortic valve stenosis.  Prox Cx to Mid Cx lesion, 95 %stenosed.  A STENT PROMUS PREM MR 2.75X16 drug eluting stent was successfully placed, postdilated to >58mm.  Post intervention, there is a 0% residual stenosis.  Mid LAD-2 lesion, 80 %stenosed.  A STENT PROMUS PREM MR 4.26S34 drug eluting stent was successfully placed, postdilated to >64mm.  Post intervention, there is a 0% residual stenosis.  Mid RCA lesion, 90 %stenosed.  A STENT PROMUS PREM MR 3.0X20 drug eluting stent was successfully placed, postdilated to 3.3 mm.  Post intervention, there is a 0% residual stenosis.  Dist RCA-2 lesion, 70 %stenosed.  A STENT PROMUS PREM MR 2.5X12 drug eluting stent was successfully placed.  Post intervention, there is a 10% residual stenosis.   Severe three vessel disease.  Given the patient's other comorbidities including multiple surgeries for breast cancer, she was adament that if PCI could be done she wanted to avoid CABG.  She is somewhat frail, having lost 40 lbs with chemotherapy.  After discussion with Dr. Tamala Julian, we proceded with multivessel PCI.    Successful DES placement to the mid LAD, mid circumflex, mid and distal RCA with short stents.  Continue DAPT for at least 12 months along with aggressive secondary prevention.       ASSESSMENT AND PLAN:  1.  NSTEMI recently with stents X 4 to different vessels.  On Brilinta and ASA.    2.  SOB may be due to brilinta -will check BNP  If not elevated will need to change to Plavix with loading dose.    3.  CAD needs prn NTG  4.   HLD on stating, lipitor 40 mg recheck in 4-6weeks.  5.  HTN controlled to low  6.  Bradycardic on toprol xl 12.5 mg, ( monitor HR)  Current medicines are reviewed with the patient today.  The patient Has no concerns regarding medicines.  The following changes have been made:  See above Labs/ tests ordered today include:see above  Disposition:   FU:  see above  Signed, Cecilie Kicks, NP  01/20/2017 8:26 PM    [facility] [place], [address] [place], [place] Phone: [phone]; Fax: [phone]

## 2017-01-19 LAB — PRO B NATRIURETIC PEPTIDE: NT-PRO BNP: 415 pg/mL — AB (ref 0–301)

## 2017-01-20 ENCOUNTER — Encounter: Payer: Self-pay | Admitting: Cardiology

## 2017-01-21 ENCOUNTER — Telehealth: Payer: Self-pay | Admitting: Interventional Cardiology

## 2017-01-21 DIAGNOSIS — L658 Other specified nonscarring hair loss: Secondary | ICD-10-CM | POA: Insufficient documentation

## 2017-01-21 NOTE — Telephone Encounter (Signed)
Attempted to return call to patient, but there was no answer.

## 2017-01-21 NOTE — Telephone Encounter (Signed)
New message    Patient return nurse call regarding lab work. Please call

## 2017-01-21 NOTE — Telephone Encounter (Signed)
Follow Up   Pt is adding to her call from this morning. Noticing black coffee like stools.

## 2017-01-21 NOTE — Telephone Encounter (Signed)
Follow up     Patient calling back again to request nurse call back to discuss black stools

## 2017-01-22 MED ORDER — POTASSIUM CHLORIDE ER 10 MEQ PO TBCR: 10.0000 meq | EXTENDED_RELEASE_TABLET | Freq: Every day | ORAL | 0 refills | Status: DC

## 2017-01-22 MED ORDER — FUROSEMIDE 20 MG PO TABS: 20.0000 mg | ORAL_TABLET | Freq: Every day | ORAL | 0 refills | Status: DC

## 2017-01-22 NOTE — Telephone Encounter (Signed)
Returned call to patient. Made patient aware of lab results and recommendations to try lasix 20 mg QD x 1 week and Kdur 10 mEq QD x 1 week to see if SOB improves. Rx sent to patient's preferred pharmacy. Spoke to the patient regarding her having black stools. Patient states that she did have a BM x1 yesterday that was black and tarry. Patient states that she has not had another BM since then. Patient denies having any nosebleeds, blood in her urine, increased bruising, or any other signs of bleeding. Advised for patient call her PCP to have them check her stool for the presence of blood. Per [name] we will hold off on the change to plavix until after being evaluated by PCP. Patient verbalized understanding and thanked me for the call.

## 2017-01-22 NOTE — Telephone Encounter (Signed)
Yes, needs stools checked first either with PCP or have APP see today and eval.  Hold off change until evaluated.

## 2017-01-22 NOTE — Telephone Encounter (Signed)
Left message for patient to call back  

## 2017-01-22 NOTE — Telephone Encounter (Signed)
Follow up       Pt returning your cal please call

## 2017-01-29 ENCOUNTER — Other Ambulatory Visit: Payer: Medicare Other

## 2017-01-29 ENCOUNTER — Telehealth: Payer: Self-pay | Admitting: Interventional Cardiology

## 2017-01-29 NOTE — Telephone Encounter (Signed)
OK to give Lasix 20 mg daily prn.

## 2017-01-29 NOTE — Telephone Encounter (Signed)
New Message      Patient wanted to let Dr Tamala Julian know that she finished up the Lasix and yes it helped.  She would like to know what the next step is.

## 2017-01-29 NOTE — Telephone Encounter (Signed)
Will route to Dr. Hassell Done nurse as pt is a [place] pt.

## 2017-01-29 NOTE — Telephone Encounter (Signed)
Patient calling and states that she has finished taking the lasix and kdur x 1 week as instructed per Cecilie Kicks. She states that she felt like it did help her some as she was not as winded with activity. She states that she is not sure if her SOB is related to asthma. Patient states that she does not necessarily want to take lasix everyday, but maybe just have some on hand prn. Patient denies having any SOB or any other symptoms at this time. Patient is wanting to know what the plan is next. Patient has an appointment with Ermalinda Barrios on 10/15. Made patient aware that the information would be forwarded for review. Advised for the patient to keep her appointment on 10/15 and let us know if her symptoms worsen before then. Patient verbalized understanding and thanked me for the call.

## 2017-01-30 ENCOUNTER — Ambulatory Visit
Admission: RE | Admit: 2017-01-30 | Discharge: 2017-01-30 | Disposition: A | Discharge status: Home / Self Care | Payer: Medicare Other | Source: Ambulatory Visit | Attending: Adult Health | Admitting: Adult Health

## 2017-01-30 DIAGNOSIS — C50411 Malignant neoplasm of upper-outer quadrant of right female breast: Secondary | ICD-10-CM

## 2017-01-30 DIAGNOSIS — E2839 Other primary ovarian failure: Secondary | ICD-10-CM

## 2017-01-30 DIAGNOSIS — Z170 Estrogen receptor positive status [ER+]: Principal | ICD-10-CM

## 2017-01-30 HISTORY — DX: Personal history of antineoplastic chemotherapy: Z92.21

## 2017-01-30 HISTORY — DX: Personal history of irradiation: Z92.3

## 2017-01-30 HISTORY — DX: Malignant neoplasm of unspecified site of unspecified female breast: C50.919

## 2017-01-30 MED ORDER — FUROSEMIDE 20 MG PO TABS
20.0000 mg | ORAL_TABLET | Freq: Every day | ORAL | 0 refills | Status: DC | PRN
Start: 2017-01-30 — End: 2017-02-11

## 2017-01-30 NOTE — Telephone Encounter (Signed)
Made patient aware that she can take lasix 20 mg daily AS NEEDED for shortness of breath. Rx sent to patient's preferred pharmacy. Advised for patient to keep appointment on 10/15. Patient verbalized understanding and thanked me for the call.

## 2017-01-30 NOTE — Telephone Encounter (Signed)
Left message for patient to call back  

## 2017-02-11 ENCOUNTER — Ambulatory Visit (INDEPENDENT_AMBULATORY_CARE_PROVIDER_SITE_OTHER): Payer: Medicare Other | Admitting: Physician Assistant

## 2017-02-11 ENCOUNTER — Encounter: Payer: Self-pay | Admitting: Physician Assistant

## 2017-02-11 VITALS — BP 114/62 | HR 64 | Resp 16 | Ht 63.0 in | Wt 141.8 lb

## 2017-02-11 DIAGNOSIS — I200 Unstable angina: Secondary | ICD-10-CM

## 2017-02-11 DIAGNOSIS — I10 Essential (primary) hypertension: Secondary | ICD-10-CM

## 2017-02-11 DIAGNOSIS — I5032 Chronic diastolic (congestive) heart failure: Secondary | ICD-10-CM | POA: Diagnosis not present

## 2017-02-11 DIAGNOSIS — I2510 Atherosclerotic heart disease of native coronary artery without angina pectoris: Secondary | ICD-10-CM | POA: Diagnosis not present

## 2017-02-11 MED ORDER — POTASSIUM CHLORIDE ER 10 MEQ PO TBCR: 10.0000 meq | EXTENDED_RELEASE_TABLET | ORAL | 0 refills | Status: DC | PRN

## 2017-02-11 MED ORDER — FUROSEMIDE 20 MG PO TABS: 20.0000 mg | ORAL_TABLET | Freq: Every day | ORAL | 0 refills | Status: DC | PRN

## 2017-02-11 NOTE — Progress Notes (Signed)
Cardiology Office Note    Date:  02/11/2017   ID:  [name], DOB 1946-01-30, MRN [id number]  PCP:  Seward Carol, MD  Cardiologist: Dr. Irish Lack  Chief Complaint  Patient presents with  . Shortness of Breath    History of Present Illness:  [name] is a 71 y.o. female with history of CAD status post NSTEMI 01/01/17 treated with DES 4 to the circumflex, mid LAD, mid RCA, and distal RCA  Patient has history of hypertension, breast CA status post lumpectomy chemotherapy and radiation, anxiety, fibromyalgia and hypothyroidism.  Patient saw Cecilie Kicks, NP 01/18/17 with shortness of breath and BNP was up to 415. She was given some Lasix to take for a week and breathing improved. She thought it could be asthma related also. 2-D echo 01/02/17 showed normal LVEF 60-65% with grade 1 DD.  Patient has many questions. She is leaving for a trip to [place] next week. She walked a mile and 20 minutes and is doing well. She denies any chest pain, palpitations, dyspnea, dizziness or presyncope. No edema. Breathing has improved. She says she always has asthma flares this time year.    Past Medical History:  Diagnosis Date  . Anxiety   . Arthritis 2008  . Asthma 1953   age 18  . Breast cancer ([place])   . Cancer [facility])    breast cancer  . Chronic vaginitis 1998  . Fibromyalgia 1982  . GERD (gastroesophageal reflux disease)    since at least 2011  . Hemorrhoids 2009  . History of radiation therapy 01/18/16-03/05/16   right breast/nodal areas 45 Gy, right breast boost 16 Gy  . Hypoglycemia 2003  . Hypothyroidism 1964   age 43  . Interstitial cystitis 1998  . Irritable bowel syndrome 06/14/2009   Qualifier: Diagnosis of  By: Patsy Baltimore RN, Langley Gauss    . Menopausal symptoms   . Osteoporosis 12/24/2006  . Personal history of chemotherapy   . Personal history of radiation therapy   . Pneumonia 1979   varicella  . PONV (postoperative nausea and vomiting) 1979    Past Surgical  History:  Procedure Laterality Date  . BLADDER HYPEREXTENSION     X4, prior to 01/2006  . BLADDER SUSPENSION    . BREAST BIOPSY    . BREAST LUMPECTOMY Right    2017  . BREAST LUMPECTOMY WITH NEEDLE LOCALIZATION AND AXILLARY LYMPH NODE DISSECTION Right 12/05/2015   Procedure: RIGHT BREAST NEEDLE LOCALIZED (WIRES X 2) LUMPECTOMY WITH AXILLARY LYMPH NODE DISSECTION;  Surgeon: Fanny Skates, MD;  Location: [place];  Service: General;  Laterality: Right;  . BREAST LUMPECTOMY WITH RADIOACTIVE SEED LOCALIZATION Left 12/05/2015   Procedure: RADIOACTIVE SEED GUIDED LEFT BREAST LUMPECTOMY;  Surgeon: Fanny Skates, MD;  Location: [place];  Service: General;  Laterality: Left;  . BREAST SURGERY    . BUNIONECTOMY     prior to 01/2006  . CESAREAN SECTION  1979  . CHOLECYSTECTOMY  02/08/2006   DR. INGRAM  . COLONOSCOPY    . CORONARY STENT INTERVENTION N/A 01/01/2017   Procedure: CORONARY STENT INTERVENTION;  Surgeon: Jettie Booze, MD;  Location: [place] [facility];  Service: Cardiovascular;  Laterality: N/A;  . DENTAL SURGERY  2009-2016   13 dental implants  . LEFT HEART CATH AND CORONARY ANGIOGRAPHY N/A 01/01/2017   Procedure: LEFT HEART CATH AND CORONARY ANGIOGRAPHY;  Surgeon: Jettie Booze, MD;  Location: [place] [facility];  Service: Cardiovascular;  Laterality: N/A;  . LEG SURGERY  07/06/2004   Broken tibia  . NASAL SINUS SURGERY     X4; prior to 01/2006  . PORT-A-CATH REMOVAL Right 12/05/2015   Procedure: REMOVAL PORT-A-CATH;  Surgeon: Fanny Skates, MD;  Location: [place];  Service: General;  Laterality: Right;  . PORTACATH PLACEMENT Left 09/08/2015   Procedure: INSERTION OF PORT-A-CATH ;  Surgeon: Fanny Skates, MD;  Location: [place];  Service: General;  Laterality: Left;    Current Medications: Current Meds  Medication Sig  . albuterol (PROVENTIL HFA;VENTOLIN HFA) 108 (90 Base) MCG/ACT inhaler Inhale 1-2 puffs into the lungs every 6 (six) hours as needed for wheezing or shortness of  breath. Started in 1998  . anastrozole (ARIMIDEX) 1 MG tablet Take 1 tablet (1 mg total) by mouth daily.  Marland Kitchen aspirin EC 81 MG EC tablet Take 1 tablet (81 mg total) by mouth daily.  Marland Kitchen atorvastatin (LIPITOR) 40 MG tablet Take 1 tablet (40 mg total) by mouth daily at 6 PM.  . buPROPion (WELLBUTRIN XL) 300 MG 24 hr tablet Take 300 mg by mouth daily.   . Calcium Carbonate-Vitamin D (CALCIUM + D PO) Take 1 tablet by mouth at bedtime. Started approximately 1998  . carisoprodol (SOMA) 350 MG tablet Take 350 mg by mouth 2 (two) times daily as needed for muscle spasms. Started in 2008.  . cycloSPORINE (RESTASIS) 0.05 % ophthalmic emulsion Place 1 drop into both eyes 2 (two) times daily.  . diazepam (VALIUM) 5 MG tablet Take 5 mg by mouth every 6 (six) hours as needed for muscle spasms (interstitial cystitis). Started approximately 2008.  . furosemide (LASIX) 20 MG tablet Take 1 tablet (20 mg total) by mouth daily as needed (shortness of breath).  Marland Kitchen HYDROcodone-acetaminophen (NORCO) 10-325 MG tablet Take 1 tablet by mouth every 4 (four) hours as needed for moderate pain or severe pain (as needed for fibromyalgia and IC).   . hydrOXYzine (VISTARIL) 25 MG capsule Take 25 mg by mouth 3 times/day as needed-between meals & bedtime.   Marland Kitchen levothyroxine (SYNTHROID) 137 MCG tablet Take 137 mcg by mouth daily.   Marland Kitchen LORazepam (ATIVAN) 2 MG tablet Take 1 tablet by mouth daily as needed for anxiety.   . metoprolol succinate (TOPROL-XL) 25 MG 24 hr tablet Take 0.5 tablets (12.5 mg total) by mouth daily.  . minoxidil (ROGAINE) 2 % external solution Apply topically 2 (two) times daily.  . Multiple Vitamin (MULTIVITAMIN) capsule Take 1 capsule by mouth daily. Taking since at least 1988.  . nitroGLYCERIN (NITROSTAT) 0.4 MG SL tablet Place 1 tablet (0.4 mg total) under the tongue every 5 (five) minutes as needed for chest pain.  . NONFORMULARY OR COMPOUNDED ITEM Place 1 suppository vaginally daily. compounded medication - vitamin  E  . ondansetron (ZOFRAN) 4 MG tablet Take 1 tablet by mouth daily as needed for nausea or vomiting.   . pantoprazole (PROTONIX) 40 MG tablet Take 40 mg by mouth 2 (two) times daily. Started in 2012.  . potassium chloride (K-DUR) 10 MEQ tablet Take 1 tablet (10 mEq total) by mouth as needed. Take with Lasix  . prochlorperazine (COMPAZINE) 10 MG tablet Take 1 tablet (10 mg total) by mouth every 6 (six) hours as needed (Nausea or vomiting).  Marland Kitchen terconazole (TERAZOL 7) 0.4 % vaginal cream Place 1 applicator vaginally 2 (two) times daily as needed (irritation).   . ticagrelor (BRILINTA) 90 MG TABS tablet Take 1 tablet (90 mg total) by mouth 2 (two) times daily.  . Vitamin D, Ergocalciferol, (DRISDOL) 50000  units CAPS capsule Take 1 capsule (50,000 Units total) by mouth every 7 (seven) days.  Marland Kitchen zolpidem (AMBIEN) 10 MG tablet Take 10 mg by mouth at bedtime as needed for sleep.   . [DISCONTINUED] furosemide (LASIX) 20 MG tablet Take 1 tablet (20 mg total) by mouth daily as needed (shortness of breath).  . [DISCONTINUED] hydrOXYzine (ATARAX/VISTARIL) 25 MG tablet Take 1 tablet (25 mg total) by mouth 3 (three) times daily as needed for anxiety or itching.  . [DISCONTINUED] potassium chloride (K-DUR) 10 MEQ tablet Take 1 tablet (10 mEq total) by mouth daily. For 1 week     Allergies:   Penicillins; Chlorhexidine; and Other   Social History   Social History  . Marital status: Married    Spouse name: N/A  . Number of children: 2  . Years of education: N/A   Social History Main Topics  . Smoking status: Former Smoker    Packs/day: 0.25    Years: 15.00    Types: Cigarettes  . Smokeless tobacco: Never Used     Comment: social smoker/"on and off"  . Alcohol use 0.0 oz/week     Comment: social  . Drug use: No  . Sexual activity: Yes    Birth control/ protection: Post-menopausal   Other Topics Concern  . Not on file   Social History Narrative  . No narrative on file     Family History:  The  patient's family history includes Breast cancer (age of onset: 1) in her sister; Breast cancer (age of onset: 64) in her other; Cancer in her sister; Heart attack in her maternal grandfather; Heart disease in her mother, paternal grandmother, and sister; Hypertension in her mother; Kidney cancer (age of onset: 58) in her paternal uncle; Kidney failure (age of onset: 58) in her maternal grandmother; Melanoma in her sister; Parkinsonism in her father, paternal aunt, and paternal uncle.   ROS:   Please see the history of present illness.    Review of Systems  Constitution: Negative.  HENT: Negative.   Eyes: Negative.   Cardiovascular: Positive for dyspnea on exertion.  Respiratory: Negative.   Hematologic/Lymphatic: Negative.   Musculoskeletal: Negative.  Negative for joint pain.  Gastrointestinal: Negative.   Genitourinary: Negative.   Neurological: Negative.    All other systems reviewed and are negative.   PHYSICAL EXAM:   VS:  BP 114/62   Pulse 64   Resp 16   Ht 5\' 3"  (1.6 m)   Wt 141 lb 12.8 oz (64.3 kg)   LMP 04/30/1996 (Approximate)   SpO2 97%   BMI 25.12 kg/m   Physical Exam  GEN: Well nourished, well developed, in no acute distress  Neck: no JVD, carotid bruits, or masses Cardiac:RRR; no murmurs, rubs, or gallops  Respiratory:  clear to auscultation bilaterally, normal work of breathing GI: soft, nontender, nondistended, + BS Ext: without cyanosis, clubbing, or edema, Good distal pulses bilaterally Neuro:  Alert and Oriented x 3 Psych: euthymic mood, full affect  Wt Readings from Last 3 Encounters:  02/11/17 141 lb 12.8 oz (64.3 kg)  01/18/17 144 lb 12.8 oz (65.7 kg)  01/03/17 142 lb 14.4 oz (64.8 kg)      Studies/Labs Reviewed:   EKG:  EKG is not ordered today.    Recent Labs: 11/28/2016: ALT 10 01/02/2017: TSH 1.325 01/03/2017: Hemoglobin 12.2; Platelets 274 01/18/2017: BUN 10; Creatinine, Ser 0.87; NT-Pro BNP 415; Potassium 3.8; Sodium 141   Lipid Panel      Component Value Date/Time  CHOL 189 01/03/2017 0402   TRIG 83 01/03/2017 0402   HDL 67 01/03/2017 0402   CHOLHDL 2.8 01/03/2017 0402   VLDL 17 01/03/2017 0402   LDLCALC 105 (H) 01/03/2017 0402    Additional studies/ records that were reviewed today include:   01/02/17 Echo Study Conclusions   - Left ventricle: The cavity size was normal. Wall thickness was   normal. Systolic function was normal. The estimated ejection   fraction was in the range of 60% to 65%. Wall motion was normal;   there were no regional wall motion abnormalities. Doppler   parameters are consistent with abnormal left ventricular   relaxation (grade 1 diastolic dysfunction).   Impressions:   - Normal LV systolic function; mild diastolic dysfunction; strain   not performed.     Procedures    CORONARY STENT INTERVENTION  LEFT HEART CATH AND CORONARY ANGIOGRAPHY  Conclusion       The left ventricular systolic function is normal.  LV end diastolic pressure is normal.  The left ventricular ejection fraction is 55-65% by visual estimate.  There is no aortic valve stenosis.  Prox Cx to Mid Cx lesion, 95 %stenosed.  A STENT PROMUS PREM MR 2.75X16 drug eluting stent was successfully placed, postdilated to >43mm.  Post intervention, there is a 0% residual stenosis.  Mid LAD-2 lesion, 80 %stenosed.  A STENT PROMUS PREM MR 5.73U20 drug eluting stent was successfully placed, postdilated to >12mm.  Post intervention, there is a 0% residual stenosis.  Mid RCA lesion, 90 %stenosed.  A STENT PROMUS PREM MR 3.0X20 drug eluting stent was successfully placed, postdilated to 3.3 mm.  Post intervention, there is a 0% residual stenosis.  Dist RCA-2 lesion, 70 %stenosed.  A STENT PROMUS PREM MR 2.5X12 drug eluting stent was successfully placed.  Post intervention, there is a 10% residual stenosis.   Severe three vessel disease.  Given the patient's other comorbidities including multiple surgeries for  breast cancer, she was adament that if PCI could be done she wanted to avoid CABG.  She is somewhat frail, having lost 40 lbs with chemotherapy.  After discussion with Dr. Tamala Julian, we proceded with multivessel PCI.     Successful DES placement to the mid LAD, mid circumflex, mid and distal RCA with short stents.   Continue DAPT for at least 12 months along with aggressive secondary prevention.           ASSESSMENT:    1. Coronary artery disease involving native coronary artery of native heart without angina pectoris   2. Essential hypertension   3. Chronic diastolic CHF (congestive heart failure) (HCC)      PLAN:  In order of problems listed above:  CAD status post multiple stents on aspirin and Plavix. Having some diarrhea that she thinks may be due to the Lipitor. I told her she can hold it for 3 or 4 days to see if it improves. If it does she needs to call and we will switch her medication. Wear compression stockings and walk around frequently on the flight to [place]. Follow-up with Dr.Varanasi in 2 months.  Essential hypertension well controlled  Chronic diastolic CHF with elevated BNP at 400 improved with Lasix 20 mg daily for 5 days. We'll give her a refill to take when necessary especially since she's going out of the country.    Medication Adjustments/Labs and Tests Ordered: Current medicines are reviewed at length with the patient today.  Concerns regarding medicines are outlined above.  Medication changes,  Labs and Tests ordered today are listed in the Patient Instructions below. There are no Patient Instructions on file for this visit.   Signed, Ermalinda Barrios, PA-C  02/11/2017 1:29 PM    [facility] [address], [place], [place]  [address] Phone: [phone]; Fax: [phone]

## 2017-02-11 NOTE — Patient Instructions (Signed)
Your physician recommends that you continue on your current medications as directed. Please refer to the Current Medication list given to you today.  Your physician recommends that you schedule a follow-up appointment in: 2 months with Dr. Irish Lack   Low-Sodium Eating Plan Sodium, which is an element that makes up salt, helps you maintain a healthy balance of fluids in your body. Too much sodium can increase your blood pressure and cause fluid and waste to be held in your body. Your health care provider or dietitian may recommend following this plan if you have high blood pressure (hypertension), kidney disease, liver disease, or heart failure. Eating less sodium can help lower your blood pressure, reduce swelling, and protect your heart, liver, and kidneys. What are tips for following this plan? General guidelines  Most people on this plan should limit their sodium intake to 1,500-2,000 mg (milligrams) of sodium each day. Reading food labels  The Nutrition Facts label lists the amount of sodium in one serving of the food. If you eat more than one serving, you must multiply the listed amount of sodium by the number of servings.  Choose foods with less than 140 mg of sodium per serving.  Avoid foods with 300 mg of sodium or more per serving. Shopping  Look for lower-sodium products, often labeled as "low-sodium" or "no salt added."  Always check the sodium content even if foods are labeled as "unsalted" or "no salt added".  Buy fresh foods. ? Avoid canned foods and premade or frozen meals. ? Avoid canned, cured, or processed meats  Buy breads that have less than 80 mg of sodium per slice. Cooking  Eat more home-cooked food and less restaurant, buffet, and fast food.  Avoid adding salt when cooking. Use salt-free seasonings or herbs instead of table salt or sea salt. Check with your health care provider or pharmacist before using salt substitutes.  Cook with plant-based oils, such  as canola, sunflower, or olive oil. Meal planning  When eating at a restaurant, ask that your food be prepared with less salt or no salt, if possible.  Avoid foods that contain MSG (monosodium glutamate). MSG is sometimes added to [place] food, bouillon, and some canned foods. What foods are recommended? The items listed may not be a complete list. Talk with your dietitian about what dietary choices are best for you. Grains Low-sodium cereals, including oats, puffed wheat and rice, and shredded wheat. Low-sodium crackers. Unsalted rice. Unsalted pasta. Low-sodium bread. Whole-grain breads and whole-grain pasta. Vegetables Fresh or frozen vegetables. "No salt added" canned vegetables. "No salt added" tomato sauce and paste. Low-sodium or reduced-sodium tomato and vegetable juice. Fruits Fresh, frozen, or canned fruit. Fruit juice. Meats and other protein foods Fresh or frozen (no salt added) meat, poultry, seafood, and fish. Low-sodium canned tuna and salmon. Unsalted nuts. Dried peas, beans, and lentils without added salt. Unsalted canned beans. Eggs. Unsalted nut butters. Dairy Milk. Soy milk. Cheese that is naturally low in sodium, such as ricotta cheese, fresh mozzarella, or Swiss cheese Low-sodium or reduced-sodium cheese. Cream cheese. Yogurt. Fats and oils Unsalted butter. Unsalted margarine with no trans fat. Vegetable oils such as canola or olive oils. Seasonings and other foods Fresh and dried herbs and spices. Salt-free seasonings. Low-sodium mustard and ketchup. Sodium-free salad dressing. Sodium-free light mayonnaise. Fresh or refrigerated horseradish. Lemon juice. Vinegar. Homemade, reduced-sodium, or low-sodium soups. Unsalted popcorn and pretzels. Low-salt or salt-free chips. What foods are not recommended? The items listed may not be a complete  list. Talk with your dietitian about what dietary choices are best for you. Grains Instant hot cereals. Bread stuffing, pancake, and  biscuit mixes. Croutons. Seasoned rice or pasta mixes. Noodle soup cups. Boxed or frozen macaroni and cheese. Regular salted crackers. Self-rising flour. Vegetables Sauerkraut, pickled vegetables, and relishes. Olives. [place] fries. Onion rings. Regular canned vegetables (not low-sodium or reduced-sodium). Regular canned tomato sauce and paste (not low-sodium or reduced-sodium). Regular tomato and vegetable juice (not low-sodium or reduced-sodium). Frozen vegetables in sauces. Meats and other protein foods Meat or fish that is salted, canned, smoked, spiced, or pickled. Bacon, ham, sausage, hotdogs, corned beef, chipped beef, packaged lunch meats, salt pork, jerky, pickled herring, anchovies, regular canned tuna, sardines, salted nuts. Dairy Processed cheese and cheese spreads. Cheese curds. Blue cheese. Feta cheese. String cheese. Regular cottage cheese. Buttermilk. Canned milk. Fats and oils Salted butter. Regular margarine. Ghee. Bacon fat. Seasonings and other foods Onion salt, garlic salt, seasoned salt, table salt, and sea salt. Canned and packaged gravies. Worcestershire sauce. Tartar sauce. Barbecue sauce. Teriyaki sauce. Soy sauce, including reduced-sodium. Steak sauce. Fish sauce. Oyster sauce. Cocktail sauce. Horseradish that you find on the shelf. Regular ketchup and mustard. Meat flavorings and tenderizers. Bouillon cubes. Hot sauce and Tabasco sauce. Premade or packaged marinades. Premade or packaged taco seasonings. Relishes. Regular salad dressings. Salsa. Potato and tortilla chips. Corn chips and puffs. Salted popcorn and pretzels. Canned or dried soups. Pizza. Frozen entrees and pot pies. Summary  Eating less sodium can help lower your blood pressure, reduce swelling, and protect your heart, liver, and kidneys.  Most people on this plan should limit their sodium intake to 1,500-2,000 mg (milligrams) of sodium each day.  Canned, boxed, and frozen foods are high in sodium. Restaurant  foods, fast foods, and pizza are also very high in sodium. You also get sodium by adding salt to food.  Try to cook at home, eat more fresh fruits and vegetables, and eat less fast food, canned, processed, or prepared foods. This information is not intended to replace advice given to you by your health care provider. Make sure you discuss any questions you have with your health care provider. Document Released: 10/06/2001 Document Revised: 04/09/2016 Document Reviewed: 04/09/2016 Elsevier Interactive Patient Education  2017 [employer].

## 2017-02-14 ENCOUNTER — Telehealth: Payer: Self-pay | Admitting: Physician Assistant

## 2017-02-14 MED ORDER — EZETIMIBE 10 MG PO TABS: 10.0000 mg | ORAL_TABLET | Freq: Every day | ORAL | 11 refills | Status: DC

## 2017-02-14 NOTE — Telephone Encounter (Signed)
Let's try Zetia 10 mg daily. Can start when she gets back from [place].  Thanks, Selinda Eon

## 2017-02-14 NOTE — Telephone Encounter (Signed)
Spoke with pt who reports after holding the Lipitor her diarrhea has stopped.  Will forward to [facility] for new orders for a different medication to help control her with her cholesterol treatment.  Pt is going out of the country and does not want to start anything new until she returns.  Advised she can start any new medication after her return from [place].

## 2017-02-14 NOTE — Telephone Encounter (Signed)
New Message  Pt call requesting to speak with RN about her recent medication adjustment. Please call back to discuss

## 2017-02-15 ENCOUNTER — Telehealth (HOSPITAL_COMMUNITY): Payer: Self-pay

## 2017-02-15 ENCOUNTER — Telehealth: Payer: Self-pay | Admitting: Physician Assistant

## 2017-02-15 NOTE — Telephone Encounter (Signed)
Spoke with patient in regards to Cardiac Rehab. She is interested in the program if she needs it. She is going out of town for two weeks. She will be returning 03/04/17. She saw cardiologist on Monday 03/14/17 and stated the PA told her she does not need rehab. When patient returns from vacation she will call back.

## 2017-02-15 NOTE — Telephone Encounter (Signed)
°  New Prob  Pt has some questions regarding a medication that was recently called in for her by Estella Husk PA-C. Please call.

## 2017-02-15 NOTE — Telephone Encounter (Signed)
Called patient back about her message. Patient was concerned about the new medication Zetia. Per Estella Husk PA, patient is to try Zetia 10 mg by mouth daily, and can start when she gets back from [place]. Went over some information about Zetia as written below. Informed patient that it was fine for her to start after she gets back from [place]. Patient stated her diarrhea is better now that she is not taking Liptor. Informed patient that she can try to make healthy choices while on vacation and make sure she get some low impact exercise in, like walking, etc. Patient verbalized understanding and will start her Zetia when she gets back.   Ezetimibe Tablets What is this medicine? EZETIMIBE (ez ET i mibe) blocks the absorption of cholesterol from the stomach. It can help lower blood cholesterol for patients who are at risk of getting heart disease or a stroke. It is only for patients whose cholesterol level is not controlled by diet. This medicine may be used for other purposes; ask your health care provider or pharmacist if you have questions. COMMON BRAND NAME(S): Zetia What should I tell my health care provider before I take this medicine? They need to know if you have any of these conditions: -liver disease -an unusual or allergic reaction to ezetimibe, medicines, foods, dyes, or preservatives -pregnant or trying to get pregnant -breast-feeding How should I use this medicine? Take this medicine by mouth with a glass of water. Follow the directions on the prescription label. This medicine can be taken with or without food. Take your doses at regular intervals. Do not take your medicine more often than directed. Talk to your pediatrician regarding the use of this medicine in children. Special care may be needed. Overdosage: If you think you have taken too much of this medicine contact a poison control center or emergency room at once. NOTE: This medicine is only for you. Do not share this  medicine with others. What if I miss a dose? If you miss a dose, take it as soon as you can. If it is almost time for your next dose, take only that dose. Do not take double or extra doses. What may interact with this medicine? Do not take this medicine with any of the following medications: -fenofibrate -gemfibrozil This medicine may also interact with the following medications: -antacids -cyclosporine -herbal medicines like red yeast rice -other medicines to lower cholesterol or triglycerides This list may not describe all possible interactions. Give your health care provider a list of all the medicines, herbs, non-prescription drugs, or dietary supplements you use. Also tell them if you smoke, drink alcohol, or use illegal drugs. Some items may interact with your medicine. What should I watch for while using this medicine? Visit your doctor or health care professional for regular checks on your progress. You will need to have your cholesterol levels checked. If you are also taking some other cholesterol medicines, you will also need to have tests to make sure your liver is working properly. Tell your doctor or health care professional if you get any unexplained muscle pain, tenderness, or weakness, especially if you also have a fever and tiredness. You need to follow a low-cholesterol, low-fat diet while you are taking this medicine. This will decrease your risk of getting heart and blood vessel disease. Exercising and avoiding alcohol and smoking can also help. Ask your doctor or dietician for advice. What side effects may I notice from receiving this medicine? Side effects that you should  report to your doctor or health care professional as soon as possible: -allergic reactions like skin rash, itching or hives, swelling of the face, lips, or tongue -dark yellow or brown urine -unusually weak or tired -yellowing of the skin or eyes Side effects that usually do not require medical attention  (report to your doctor or health care professional if they continue or are bothersome): -diarrhea -dizziness -headache -stomach upset or pain This list may not describe all possible side effects. Call your doctor for medical advice about side effects. You may report side effects to FDA at 1-800-FDA-1088. Where should I keep my medicine? Keep out of the reach of children. Store at room temperature between 15 and 30 degrees C (59 and 86 degrees F). Protect from moisture. Keep container tightly closed. Throw away any unused medicine after the expiration date. NOTE: This sheet is a summary. It may not cover all possible information. If you have questions about this medicine, talk to your doctor, pharmacist, or health care provider.  2018 Elsevier/Gold Standard (2011-10-22 15:39:09)

## 2017-02-25 ENCOUNTER — Telehealth: Payer: Self-pay | Admitting: Interventional Cardiology

## 2017-02-25 NOTE — Telephone Encounter (Signed)
Called and spoke to the patient and made her aware that unfortunately we are unable to mail her [place] and neither is her pharmacy. The patient states that she does not have anyone that can pick up the Rx and mail it to her either. Patient states that she was advised by someone in [place] to go to be seen by a doctor in the ER there and they would be able to prescribe it to her. Patient states that she does not want to do this. Advised the patient to check to see if there is a pharmacy there that will accept e-prescriptions and call me back. Patient verbalized understanding and thanked me for the call.

## 2017-02-25 NOTE — Telephone Encounter (Signed)
New message   Pt is overseas in [place]  Is it ok if she goes without her Brilinta for 2 days?   She has a implant that came lose and they are going to pull it, is it safe? She had heart cath 8 weeks ago.

## 2017-02-25 NOTE — Telephone Encounter (Signed)
Patient is calling and states that she is in [place]. She states that she has an implant that is loose and she may have to have it pulled. Patient recently had stents placed on 01/01/17 and is on Brilinta. Patient made aware that she should continue Brilinta without interruption for at least 6 months. Patient states that she is 2 days short on her Brilinta supply while she is [place]. I advised the patient that she should not miss any doses due to her high risk of stent occlusion. Made patient aware that I will work on trying to find out how we can get her a supply of Brilinta to last her for her trip.

## 2017-02-26 MED ORDER — TICAGRELOR 90 MG PO TABS: 90.0000 mg | ORAL_TABLET | Freq: Two times a day (BID) | ORAL | 0 refills | Status: DC

## 2017-02-26 NOTE — Telephone Encounter (Signed)
Called and spoke to Dr. Wilford Sports who is the patient's daughter and made her aware that I would send a 30 day supply of Brilinta to the [facility] so that she can mail it to her mom in [place]. She verbalized understanding and thanked me for the call.

## 2017-02-26 NOTE — Telephone Encounter (Signed)
Called and made patient aware that the Rx for the Brilinta has been sent to the [facility] for her daughter to pick up and mail to her. Patient verbalized understanding and thanked me for the call.

## 2017-02-26 NOTE — Telephone Encounter (Signed)
Follow up   Patient wants Brilinta sent to [facility] for her daughter to pick up. States daughter is a physician in Infectious Disease. Please call daughter Dr. Lilyan Gilford  @ [phone] if more detail is needed.

## 2017-03-05 NOTE — Telephone Encounter (Signed)
Attempted to call patient in regards to Cardiac Rehab - Lm on Vm °

## 2017-03-09 ENCOUNTER — Other Ambulatory Visit: Payer: Self-pay | Admitting: Physician Assistant

## 2017-03-11 ENCOUNTER — Telehealth: Payer: Self-pay | Admitting: Oncology

## 2017-03-11 ENCOUNTER — Other Ambulatory Visit (HOSPITAL_BASED_OUTPATIENT_CLINIC_OR_DEPARTMENT_OTHER): Payer: Medicare Other

## 2017-03-11 ENCOUNTER — Encounter: Payer: Medicare Other | Admitting: *Deleted

## 2017-03-11 ENCOUNTER — Ambulatory Visit (HOSPITAL_BASED_OUTPATIENT_CLINIC_OR_DEPARTMENT_OTHER): Payer: Medicare Other | Admitting: Oncology

## 2017-03-11 VITALS — BP 148/62 | HR 61 | Temp 98.0°F | Resp 20 | Ht 63.0 in | Wt 138.6 lb

## 2017-03-11 DIAGNOSIS — Z923 Personal history of irradiation: Secondary | ICD-10-CM

## 2017-03-11 DIAGNOSIS — L658 Other specified nonscarring hair loss: Secondary | ICD-10-CM | POA: Diagnosis not present

## 2017-03-11 DIAGNOSIS — Z9221 Personal history of antineoplastic chemotherapy: Secondary | ICD-10-CM | POA: Diagnosis not present

## 2017-03-11 DIAGNOSIS — Z79811 Long term (current) use of aromatase inhibitors: Secondary | ICD-10-CM | POA: Diagnosis not present

## 2017-03-11 DIAGNOSIS — M810 Age-related osteoporosis without current pathological fracture: Secondary | ICD-10-CM | POA: Diagnosis not present

## 2017-03-11 DIAGNOSIS — Z170 Estrogen receptor positive status [ER+]: Principal | ICD-10-CM

## 2017-03-11 DIAGNOSIS — Z006 Encounter for examination for normal comparison and control in clinical research program: Secondary | ICD-10-CM

## 2017-03-11 DIAGNOSIS — C50411 Malignant neoplasm of upper-outer quadrant of right female breast: Secondary | ICD-10-CM | POA: Diagnosis present

## 2017-03-11 LAB — COMPREHENSIVE METABOLIC PANEL
ALBUMIN: 3.8 g/dL (ref 3.5–5.0)
ALK PHOS: 166 U/L — AB (ref 40–150)
ALT: 38 U/L (ref 0–55)
AST: 24 U/L (ref 5–34)
Anion Gap: 10 mEq/L (ref 3–11)
BILIRUBIN TOTAL: 0.44 mg/dL (ref 0.20–1.20)
BUN: 13.2 mg/dL (ref 7.0–26.0)
CALCIUM: 9.6 mg/dL (ref 8.4–10.4)
CO2: 25 mEq/L (ref 22–29)
Chloride: 104 mEq/L (ref 98–109)
Creatinine: 0.8 mg/dL (ref 0.6–1.1)
GLUCOSE: 136 mg/dL (ref 70–140)
POTASSIUM: 3.8 meq/L (ref 3.5–5.1)
SODIUM: 138 meq/L (ref 136–145)
TOTAL PROTEIN: 7.6 g/dL (ref 6.4–8.3)

## 2017-03-11 LAB — CBC WITH DIFFERENTIAL/PLATELET
BASO%: 0.1 % (ref 0.0–2.0)
Basophils Absolute: 0 10*3/uL (ref 0.0–0.1)
EOS%: 0.2 % (ref 0.0–7.0)
Eosinophils Absolute: 0 10*3/uL (ref 0.0–0.5)
HEMATOCRIT: 42 % (ref 34.8–46.6)
HGB: 14.3 g/dL (ref 11.6–15.9)
LYMPH#: 0.9 10*3/uL (ref 0.9–3.3)
LYMPH%: 8.7 % — AB (ref 14.0–49.7)
MCH: 32.3 pg (ref 25.1–34.0)
MCHC: 34 g/dL (ref 31.5–36.0)
MCV: 94.8 fL (ref 79.5–101.0)
MONO#: 0.1 10*3/uL (ref 0.1–0.9)
MONO%: 0.6 % (ref 0.0–14.0)
NEUT%: 90.4 % — AB (ref 38.4–76.8)
NEUTROS ABS: 8.9 10*3/uL — AB (ref 1.5–6.5)
Platelets: 331 10*3/uL (ref 145–400)
RBC: 4.43 10*6/uL (ref 3.70–5.45)
RDW: 13.6 % (ref 11.2–14.5)
WBC: 9.8 10*3/uL (ref 3.9–10.3)

## 2017-03-11 MED ORDER — PROCHLORPERAZINE MALEATE 10 MG PO TABS: 10.0000 mg | ORAL_TABLET | Freq: Four times a day (QID) | ORAL | 1 refills | Status: DC | PRN

## 2017-03-11 NOTE — Progress Notes (Signed)
03/11/2017 Patient in to clinic today for Cycle 9 visit assessments. As previously noted, visit occurred out of window due to patient's extended travel to [place]. Patient returned her completed drug diaries for Cycles 6-8 for anti-hormone therapy, indicating one missed dose due to hospitalization in September. A copy of the Cycle 9 diary had been previously mailed to the patient and she has begun completing this as well. Patient was given drug diaries to complete for Cycles 10 & 11, along with the partially completed Cycle 9 diary. Patient reports tooth infection that occurred while she was in [place], requiring antibiotic treatment, and additional procedure by her oral surgeon last week (removal of infected implant). Additionally, she was seen by her internist today due to recent URI x 2 weeks and was diagnosed with bronchitis. She reports no changes in ongoing previously reported AEs. Based on lab results review and H&P by Dr. Jana Hakim, patient condition is acceptable for continued treatment on study. Plans made for return visit in January 2019. Cindy S. 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  Acute coronary syndrome Grade 3 12/31/16 01/03/17 No Hospitalized for stent placement. Statin.  Hyponatremia Grade 1 12/31/16 01/02/17 No Stopped HCTZ  Sinus bradycardia, int. Grade 1 12/31/16 ongoing No   Dyspnea Grade 1 12/31/16 81/2/75 No   LV diastolic dysfunction Grade 1 01/02/17 ongoing No    Hyperlipidemia Grade 2 01/02/17 ongoing Yes Statin started 01/01/17 for ACS  UTI Grade 2 01/03/17 01/18/17 No Macrobid  Chronic diastolic CHF Grade 2 1/70/01 ongoing No Lasix with prophylactic K-dur  Diarrhea Grade 1 02/08/17 02/13/17 No <5 stools/day Stopped Lipitor  Tooth infection Grade 2 02/22/17 ongoing No Cipro, clindamycin. Removal of implant  Upper respiratory infection Grade 2 02/25/17 03/11/17 No Moderate symptoms  Bronchitis Grade 2 03/11/17 ongoing No Z-pack, prednisone  Increased alkaline phosphatase Grade 1 03/11/17 ongoing No   Weight loss Grade 2 03/11/17 ongoing No   Additional out of range lab results were not considered to be clinically significant. Cindy S. Brigitte Pulse BSN, RN, [place] 04/02/2017 3:22 PM

## 2017-03-11 NOTE — Telephone Encounter (Signed)
Left message for patient re lab/fu 1/30. Date per patient due to out of town 1/23. Message research nurse re change.

## 2017-03-11 NOTE — Progress Notes (Signed)
[place]  [phone] Fax:(336) [phone]     ID: CHRISHAWN KRING DOB: 1945/05/04  MR#: [id number]  NIO#:[id number]  Patient Care Team: Seward Carol, MD as PCP - General (Internal Medicine) Mischa Pollard, Virgie Dad, MD as Consulting Physician (Oncology) Fanny Skates, MD as Consulting Physician (General Surgery) Luberta Robertson, MD (Radiology) Hennie Duos, MD as Consulting Physician (Rheumatology) Domingo Pulse, MD (Urology) Druscilla Brownie, MD as Consulting Physician (Dermatology) Luberta Mutter, MD as Consulting Physician (Ophthalmology) Magnus Sinning, MD as Consulting Physician (Physical Medicine and Rehabilitation) Benson Norway, RN as Registered Nurse (Oncology) Megan Salon, MD as Consulting Physician (Gynecology) Loney Loh, MD (Dermatology) OTHER MD:  CHIEF COMPLAINT: Estrogen receptor positive breast cancer  CURRENT TREATMENT:  Anastrozole  INTERVAL HISTORY: Ethell returns today for follow-up and treatment of her estrogen receptor positive breast cancer. She is accompanied by her study nurse, Jenny Reichmann. She continues on anastrozole, which she tolerates well. She endorses unpredictable hot flashes, that happen throughout the day, but do not last very long.  Vaginal dryness is well controlled on vitamin D suppositories  REVIEW OF SYSTEMS: Shereta is doing well. Her hair still has not fully returned and she is currently using Rogaine to help. She saw Dr. Lois Huxley who told her to get a laser comb. The patient recently went to [place] with her husband, and ended up getting an abscess to her tooth implant.  This of course is not related to her breast cancer treatment.  Because of the pain, she was only able to walk about 1 mile per day. She reports everything else is going well. She denies unusual headaches, visual changes, nausea, vomiting, or dizziness. There has been no unusual cough, phlegm production, or pleurisy. This been no  change in bowel or bladder habits. She denies unexplained fatigue or unexplained weight loss, bleeding, rash, or fever. A detailed review of systems was otherwise entirely stable.   BREAST CANCER HISTORY: From the original intake note:  Claudene had routine mammographic screening in 08/18/2015 at the [facility], showing a right breast mass and possible axillary adenopathy. On 08/23/2015 she underwent bilateral diagnostic mammography with tomography and bilateral breast ultrasonography. The breast density was category B in the right breast there was a spiculated mass in the upper-outer quadrant measuring 1.2 cm and associated with pleomorphic calcifications. There were calcifications extending anteriorly and posteriorly from the mass worrisome for ductal carcinoma in situ. The total area in question is about 3 cm. There were also multiple enlarged right axillary lymph nodes. The mass was palpated as an area of fullness at the 12:00 position of the breast 3 cm from the nipple. There were palpable enlarged right axillary lymph nodes in the right axilla. Ultrasonography of the right breast confirmed an irregular hypoechoic mass in the area in question measuring 1.6 cm. There were multiple abnormal appearing enlarged right axillary lymph nodes, the largest measuring 2.3 cm.  In the left breast, mammography showed an area of architectural distortion associated with the left nipple. This was not palpable. Ultrasonography of the left breast found no correlate. This area of the left breast was biopsied under tomography 08/24/2015. This showed (SAA [id number]) a complex sclerosing lesion and fibroadenoma.  On 08/23/2015 the patient underwent biopsy of the right breast mass in 1 of the abnormal right axillary lymph nodes. Both were positive for invasive ductal carcinoma, grade 2 both estrogen receptor 100% positive and both progesterone receptor 5% positive with MIB-1 of 20-30% (the higher 1 obtained  from the lymph  node). Both biopsies were HER-2 nonamplified, the breast being ratio 1.27 and number per cell 1.90, and the lymph node 1 ratio 1.24 and number per cell 3.0.  On 08/26/2015 the patient underwent bilateral breast MRI. In the left breast there was no suspicious enhancement. The left axilla was benign. In the right breast there was a mass described as in the upper inner quadrant measuring 2.3 cm. Less than a centimeter away from this mass there was a satellite nodule measuring 0.9 cm. In the right axilla there were multiple enlarged right axillary lymph nodes, including a confluent region measuring up to 4 cm. There were also enlarged level II lymph nodes present measuring up to 2.1 cm.  The patient's subsequent history is as detailed below  PAST MEDICAL HISTORY: Past Medical History:  Diagnosis Date  . Anxiety   . Arthritis 2008  . Asthma 1953   age 89  . Breast cancer ([place])   . Cancer [facility])    breast cancer  . Chronic vaginitis 1998  . Fibromyalgia 1982  . GERD (gastroesophageal reflux disease)    since at least 2011  . Hemorrhoids 2009  . History of radiation therapy 01/18/16-03/05/16   right breast/nodal areas 45 Gy, right breast boost 16 Gy  . Hypoglycemia 2003  . Hypothyroidism 1964   age 71  . Interstitial cystitis 1998  . Irritable bowel syndrome 06/14/2009   Qualifier: Diagnosis of  By: Patsy Baltimore RN, Langley Gauss    . Menopausal symptoms   . Osteoporosis 12/24/2006  . Personal history of chemotherapy   . Personal history of radiation therapy   . Pneumonia 1979   varicella  . PONV (postoperative nausea and vomiting) 1979    PAST SURGICAL HISTORY: Past Surgical History:  Procedure Laterality Date  . BLADDER HYPEREXTENSION     X4, prior to 01/2006  . BLADDER SUSPENSION    . BREAST BIOPSY    . BREAST LUMPECTOMY Right    2017  . BREAST SURGERY    . BUNIONECTOMY     prior to 01/2006  . CESAREAN SECTION  1979  . CHOLECYSTECTOMY  02/08/2006   DR. INGRAM  . COLONOSCOPY    .  DENTAL SURGERY  2009-2016   13 dental implants  . LEG SURGERY  07/06/2004   Broken tibia  . NASAL SINUS SURGERY     X4; prior to 01/2006    FAMILY HISTORY Family History  Problem Relation Age of Onset  . Hypertension Mother   . Heart disease Mother        d. 20  . Parkinsonism Father        d. 40  . Heart disease Sister   . Breast cancer Sister 72       s/p mastectomy; reportedly negative BRCA1/2 testing approx 7 years ago  . Melanoma Sister        d. 72s; hx of incresed sun exposure  . Cancer Sister        dx. sarcoma of her face in her 68s; treated w/ MOHS surgery  . Parkinsonism Paternal Aunt        d. 90s  . Parkinsonism Paternal Uncle        d. late 32s  . Kidney failure Maternal Grandmother 55  . Heart attack Maternal Grandfather        d. 74s  . Heart disease Paternal Grandmother        d. 43  . Breast cancer Other 64  maternal great aunt (MGF's sister)  . Kidney cancer Paternal Uncle 43       d. 45s; unsure about smoking status  The patient's father died at age 722 from complications of Parkinson's disease. The patient's mother died age 4 with heart disease. Shanaya had no brothers. She had 2 sisters. One sister was diagnosed with breast cancer at age 88. She was tested for the BRCA gene and was negative. The same sister also had melanoma diagnosed in her 28s. The patient has one maternal great aunt diagnosed with breast cancer around age 727. There is no history of ovarian cancer in the family  GYNECOLOGIC HISTORY:  Patient's last menstrual period was 04/30/1996 (approximate). Menarche age 12, first live birth age 85. The patient is GX P2. She stopped having periods in her early 25s and took hormone replacement until her breast cancer diagnosis, April 2017.  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 [name] has a history of non-Hodgkin's lymphoma and is status post  first auto and then allo transplants. He has been in remission for more than 10 years. Their daughter Nira Conn lives in [place] were she is Garment/textile technologist of the [employer]. Daughter Belenda Cruise is an infectious disease M.D. working and teaching at [employer] in [place]. The patient has 2 grandchildren. The family attends [place]: Not in place   HEALTH MAINTENANCE: Social History   Tobacco Use  . Smoking status: Former Smoker    Packs/day: 0.25    Years: 15.00    Pack years: 3.75    Types: Cigarettes  . Smokeless tobacco: Never Used  . Tobacco comment: social smoker/"on and off"  Substance Use Topics  . Alcohol use: Yes    Alcohol/week: 0.0 oz    Comment: social  . Drug use: No     Colonoscopy: 2016/ [facility]  PAP: 2015  Bone density: October 2018, T-score: -2.7  Lipid panel:  Allergies  Allergen Reactions  . Penicillins Other (See Comments)    Has patient had a PCN reaction causing immediate rash, facial/tongue/throat swelling, SOB or lightheadedness with hypotension: No Has patient had a PCN reaction causing severe rash involving mucus membranes or skin necrosis: No Has patient had a PCN reaction that required hospitalization No Has patient had a PCN reaction occurring within the last 10 years: No If all of the above answers are "NO", then may proceed with Cephalosporin use.  . Chlorhexidine Other (See Comments)    [place] IS OK. The CLOTH WIPE causes reaction. Causes itching all over the area  . Other Rash    Adhesive tape    Current Outpatient Medications  Medication Sig Dispense Refill  . albuterol (PROVENTIL HFA;VENTOLIN HFA) 108 (90 Base) MCG/ACT inhaler Inhale 1-2 puffs into the lungs every 6 (six) hours as needed for wheezing or shortness of breath. Started in 1998    . anastrozole (ARIMIDEX) 1 MG tablet Take 1 tablet (1 mg total) by mouth daily. 90 tablet 4  . aspirin EC 81 MG EC tablet Take 1 tablet (81 mg total) by mouth daily.  30 tablet 0  . buPROPion (WELLBUTRIN XL) 300 MG 24 hr tablet Take 300 mg by mouth daily.     . Calcium Carbonate-Vitamin D (CALCIUM + D PO) Take 1 tablet by mouth at bedtime. Started approximately 1998    . carisoprodol (SOMA) 350 MG tablet Take 350 mg by mouth  2 (two) times daily as needed for muscle spasms. Started in 2008.  2  . cycloSPORINE (RESTASIS) 0.05 % ophthalmic emulsion Place 1 drop into both eyes 2 (two) times daily. 5.5 mL 3  . diazepam (VALIUM) 5 MG tablet Take 5 mg by mouth every 6 (six) hours as needed for muscle spasms (interstitial cystitis). Started approximately 2008.    [place] ezetimibe (ZETIA) 10 MG tablet Take 1 tablet (10 mg total) by mouth daily. 30 tablet 11  . fluconazole (DIFLUCAN) 100 MG tablet Take 1 tablet (100 mg total) by mouth daily. 30 tablet 3  . furosemide (LASIX) 20 MG tablet Take 1 tablet (20 mg total) by mouth daily as needed (shortness of breath). 30 tablet 0  . HYDROcodone-acetaminophen (NORCO) 10-325 MG tablet Take 1 tablet by mouth every 4 (four) hours as needed for moderate pain or severe pain (as needed for fibromyalgia and IC).     . hydrOXYzine (VISTARIL) 25 MG capsule Take 25 mg by mouth 3 times/day as needed-between meals & bedtime.     [place] levothyroxine (SYNTHROID) 137 MCG tablet Take 137 mcg by mouth daily.     [place] LORazepam (ATIVAN) 2 MG tablet Take 1 tablet by mouth daily as needed for anxiety.   5  . metoprolol succinate (TOPROL-XL) 25 MG 24 hr tablet Take 0.5 tablets (12.5 mg total) by mouth daily. 15 tablet 6  . minoxidil (ROGAINE) 2 % external solution Apply topically 2 (two) times daily.    . Multiple Vitamin (MULTIVITAMIN) capsule Take 1 capsule by mouth daily. Taking since at least 1988.    . nitroGLYCERIN (NITROSTAT) 0.4 MG SL tablet Place 1 tablet (0.4 mg total) under the tongue every 5 (five) minutes as needed for chest pain. 25 tablet 3  . NONFORMULARY OR COMPOUNDED ITEM Place 1 suppository vaginally daily. compounded medication - vitamin E      . ondansetron (ZOFRAN) 4 MG tablet Take 1 tablet by mouth daily as needed for nausea or vomiting.   3  . pantoprazole (PROTONIX) 40 MG tablet Take 40 mg by mouth 2 (two) times daily. Started in 2012.    . potassium chloride (K-DUR) 10 MEQ tablet Take 1 tablet (10 mEq total) by mouth as needed. Take with Lasix 30 tablet 0  . prochlorperazine (COMPAZINE) 10 MG tablet Take 1 tablet (10 mg total) by mouth every 6 (six) hours as needed (Nausea or vomiting). 30 tablet 1  . terconazole (TERAZOL 7) 0.4 % vaginal cream Place 1 applicator vaginally 2 (two) times daily as needed (irritation).     . ticagrelor (BRILINTA) 90 MG TABS tablet Take 1 tablet (90 mg total) by mouth 2 (two) times daily. 60 tablet 0  . Vitamin D, Ergocalciferol, (DRISDOL) 50000 units CAPS capsule Take 1 capsule (50,000 Units total) by mouth every 7 (seven) days. 12 capsule 3  . zolpidem (AMBIEN) 10 MG tablet Take 10 mg by mouth at bedtime as needed for sleep.      No current facility-administered medications for this visit.     OBJECTIVE: Middle-aged white woman who appears stated age  27:   03/11/17 1551  BP: (!) 148/62  Pulse: 61  Resp: 20  Temp: 98 F (36.7 C)  SpO2: 96%     Body mass index is 24.55 kg/m.    ECOG FS:1 - Symptomatic but completely ambulatory  There is still a glabrous area in the crown of the scalp Sclerae unicteric, pupils round and equal Oropharynx clear and moist No cervical or  supraclavicular adenopathy Lungs no rales or rhonchi Heart regular rate and rhythm Abd soft, nontender, positive bowel sounds MSK no focal spinal tenderness, no upper extremity lymphedema Neuro: nonfocal, well oriented, appropriate affect Breasts: The right breast has undergone lumpectomy and radiation.  There is some skin thickening as previously noted.  There is no evidence of local recurrence.  The left breast is benign.  Both axillae are benign.  Right breast 08/15/2016     LAB RESULTS:  CMP      Component Value Date/Time   NA 138 03/11/2017 1524   K 3.8 03/11/2017 1524   CL 104 01/18/2017 1552   CO2 25 03/11/2017 1524   GLUCOSE 136 03/11/2017 1524   BUN 13.2 03/11/2017 1524   CREATININE 0.8 03/11/2017 1524   CALCIUM 9.6 03/11/2017 1524   PROT 7.6 03/11/2017 1524   ALBUMIN 3.8 03/11/2017 1524   AST 24 03/11/2017 1524   ALT 38 03/11/2017 1524   ALKPHOS 166 (H) 03/11/2017 1524   BILITOT 0.44 03/11/2017 1524   GFRNONAA 67 01/18/2017 1552   GFRAA 77 01/18/2017 1552    INo results found for: SPEP, UPEP  Lab Results  Component Value Date   WBC 9.8 03/11/2017   NEUTROABS 8.9 (H) 03/11/2017   HGB 14.3 03/11/2017   HCT 42.0 03/11/2017   MCV 94.8 03/11/2017   PLT 331 03/11/2017      Chemistry      Component Value Date/Time   NA 138 03/11/2017 1524   K 3.8 03/11/2017 1524   CL 104 01/18/2017 1552   CO2 25 03/11/2017 1524   BUN 13.2 03/11/2017 1524   CREATININE 0.8 03/11/2017 1524      Component Value Date/Time   CALCIUM 9.6 03/11/2017 1524   ALKPHOS 166 (H) 03/11/2017 1524   AST 24 03/11/2017 1524   ALT 38 03/11/2017 1524   BILITOT 0.44 03/11/2017 1524       No results found for: LABCA2  No components found for: LABCA125  No results for input(s): INR in the last 168 hours.  Urinalysis    Component Value Date/Time   COLORURINE YELLOW 01/03/2017 1205   APPEARANCEUR HAZY (A) 01/03/2017 1205   LABSPEC 1.006 01/03/2017 1205   PHURINE 7.0 01/03/2017 1205   GLUCOSEU NEGATIVE 01/03/2017 1205   HGBUR SMALL (A) 01/03/2017 1205   BILIRUBINUR NEGATIVE 01/03/2017 1205   BILIRUBINUR n 10/01/2016 1316   KETONESUR NEGATIVE 01/03/2017 1205   PROTEINUR NEGATIVE 01/03/2017 1205   UROBILINOGEN 0.2 10/01/2016 1316   NITRITE NEGATIVE 01/03/2017 1205   LEUKOCYTESUR LARGE (A) 01/03/2017 1205      ELIGIBLE FOR AVAILABLE RESEARCH PROTOCOL: PALLAS, neuropathy study  STUDIES: Diagnostic Bilateral Breast Mammogram with TOMO, 01/30/2017, breast density category B,  shows no evidence of malignancy.   Bone Density, 01/30/2017, T-score of -2.7  ASSESSMENT: 71 y.o. [place] woman status post right breast upper outer quadrant and right axillary lymph node biopsy 08/23/2015, both positive for a clinical T1 N2, stage IIIA invasive ductal carcinoma, grade 2, strongly estrogen receptor positive, 5% progesterone receptor positive, with an MIB-120-30% and no HER-2 amplification  (a) biopsy of a 0.9 cm Right breast satellite nodule 09/13/2015 showed invasive ductal carcinoma, grade 2, E-cadherin strongly positive, with a prognostic panel identical to the larger right upper quadrant mass  (1) left breast biopsy 08/24/2015 shows a complex sclerosing lesion, excised 12/05/2015  (2) neoadjuvant chemotherapy consisting of cyclophosphamide and docetaxel every 21 days 4, with OnPro support, started 09/12/2015, completed 11/14/2015  (3) status post right  lumpectomy and sentinel lymph node dissection 12/05/2015 for a residual pT2 pN3, stage IIIC invasive ductal carcinoma, grade 2, with a close anterior margin (skin)  (4) adjuvant radiation 01/18/16 - 11/6/17with capecitabine chemo-sensitization   1) Right breast/nodal areas: 45 Gy in 25 fractions                         2) Right breast boost: 16 Gy in 8 fractions  (5) capecitabine started 04/13/2016, discontinued January 2018 with mild/moderate side effects developing  (6) anastrozole started 06/01/2016  (a) bone density January 30, 2017 shows a T score of -2.7 (osteoporosis)  (7) genetics testing ([place]kenazi descent) 02/07/2016 through the [place] offered by [facility] Hope Pigeon, MD) i found no deleterious mutations in  ATM, BARD1, BRCA1, BRCA2, BRIP1, CDH1, CHEK2, FANCC, MLH1, MSH2, MSH6, NBN, PALB2, PMS2, PTEN, RAD51C, RAD51D, TP53, and XRCC2.  This panel also includes deletion/duplication analysis (without sequencing) for one gene, EPCAM.  (8) consented to PALLAS trial   (a) randomized  to anastrozole alone  PLAN: Ozzie is now a little over a year out from definitive surgery for her breast cancer with no evidence of disease recurrence.  This is very favorable.  She has lost a little bit of weight but then she has had a significant mouth infection and is still on antibiotics.  This is not unexpected and I do not think it is related to her breast cancer or its treatment.  She does have what may be permanent hair loss.  She is considering some suggestions made by Dr Leonie Green at [facility] is not getting any benefit from Rogaine.  She is I think appropriately wary of starting minoxidil.  She has osteoporosis according to her October bone density.  I would like to start her on zolendronate twice a year which will also reduce her risk of breast cancer.  Unfortunately I cannot do this until her mouth problems have completely healed.  This is unlikely to be the case for another year at least.  She will see me again in January.  She knows to call for any problems that may develop before that visit.   Dajiah Kooi, Virgie Dad, MD  03/11/17 4:09 PM Medical Oncology and Hematology [facility] [address] [place], [place] [address] Tel. [phone]    Fax. [phone]  This document serves as a record of services personally performed by Chauncey Cruel, MD. It was created on his behalf by Margit Banda, a trained medical scribe. The creation of this record is based on the scribe's personal observations and the provider's statements to them.   I have reviewed the above documentation for accuracy and completeness, and I agree with the above.

## 2017-03-12 ENCOUNTER — Encounter (HOSPITAL_COMMUNITY): Payer: Self-pay

## 2017-03-12 NOTE — Telephone Encounter (Signed)
2nd attempt to call patient in regards to Cardiac Rehab - Lm on Vm. Sending letter. °

## 2017-03-25 ENCOUNTER — Telehealth (HOSPITAL_COMMUNITY): Payer: Self-pay

## 2017-03-27 ENCOUNTER — Other Ambulatory Visit: Payer: Self-pay | Admitting: *Deleted

## 2017-03-27 DIAGNOSIS — T7840XS Allergy, unspecified, sequela: Secondary | ICD-10-CM

## 2017-03-27 NOTE — Telephone Encounter (Signed)
Cardiac Rehab Medication Review by a Pharmacist  Does the patient  feel that his/her medications are working for him/her?  yes  Has the patient been experiencing any side effects to the medications prescribed?  no  Does the patient measure his/her own blood pressure or blood glucose at home?  yes   Does the patient have any problems obtaining medications due to transportation or finances?   no  Understanding of regimen: good Understanding of indications: good Potential of compliance: good    Pharmacist comments: Ms. Noxon is a 71 y/o F who I contacted prior to her cardiac rehab appointment to go over her medication history. She reports that she stopped taking atorvastatin and was switched to zetia. She denies any concerns at this time and will follow up at rehab tomorrow as she is currently at dinner.  Patterson Hammersmith PharmD PGY1 Pharmacy Practice Resident 03/27/2017 6:28 PM

## 2017-03-28 ENCOUNTER — Encounter (HOSPITAL_COMMUNITY)
Admission: RE | Admit: 2017-03-28 | Discharge: 2017-03-28 | Disposition: A | Discharge status: Home / Self Care | Payer: Medicare Other | Source: Ambulatory Visit | Attending: Interventional Cardiology | Admitting: Interventional Cardiology

## 2017-03-28 ENCOUNTER — Encounter (HOSPITAL_COMMUNITY): Payer: Self-pay

## 2017-03-28 VITALS — BP 122/70 | HR 62 | Ht 63.5 in | Wt 139.8 lb

## 2017-03-28 DIAGNOSIS — I214 Non-ST elevation (NSTEMI) myocardial infarction: Secondary | ICD-10-CM

## 2017-03-28 DIAGNOSIS — Z955 Presence of coronary angioplasty implant and graft: Secondary | ICD-10-CM

## 2017-03-28 NOTE — Progress Notes (Signed)
[name] 71 y.o. female DOB: 04/02/1946 MRN: [id number]      Nutrition Note  1. NSTEMI (non-ST elevated myocardial infarction) ([place]) 12/31/16   2. S/P coronary artery stent placement 01/01/17    Past Medical History:  Diagnosis Date  . Anxiety   . Arthritis 2008  . Asthma 1953   age 77  . Breast cancer ([place])   . Cancer [facility])    breast cancer  . Chronic vaginitis 1998  . Fibromyalgia 1982  . GERD (gastroesophageal reflux disease)    since at least 2011  . Hemorrhoids 2009  . History of radiation therapy 01/18/16-03/05/16   right breast/nodal areas 45 Gy, right breast boost 16 Gy  . Hypoglycemia 2003  . Hypothyroidism 1964   age 40  . Interstitial cystitis 1998  . Irritable bowel syndrome 06/14/2009   Qualifier: Diagnosis of  By: Patsy Baltimore RN, Langley Gauss    . Menopausal symptoms   . Osteoporosis 12/24/2006  . Personal history of chemotherapy   . Personal history of radiation therapy   . Pneumonia 1979   varicella  . PONV (postoperative nausea and vomiting) 1979   Meds reviewed.   HT: Ht Readings from Last 1 Encounters:  03/28/17 5' 3.5" (1.613 m)    WT: Wt Readings from Last 3 Encounters:  03/28/17 139 lb 12.4 oz (63.4 kg)  03/11/17 138 lb 9.6 oz (62.9 kg)  02/11/17 141 lb 12.8 oz (64.3 kg)     BMI 24.4   Current tobacco use? No  Labs:  Lipid Panel     Component Value Date/Time   CHOL 189 01/03/2017 0402   TRIG 83 01/03/2017 0402   HDL 67 01/03/2017 0402   CHOLHDL 2.8 01/03/2017 0402   VLDL 17 01/03/2017 0402   LDLCALC 105 (H) 01/03/2017 0402    Lab Results  Component Value Date   HGBA1C 4.9 01/03/2017   CBG (last 3)  No results for input(s): GLUCAP in the last 72 hours.  Nutrition Note Spoke with pt. Nutrition plan and goals reviewed with pt. Pt reports her appetite has been poor during chemotherapy. Pt c/o not feeling hungry. Per discussion, pt weighed 184 lb prior to starting chemo. Per EMR, pt has lost 38 lb over the past 15 months. Pt wt has been  fairly stable over the past 5 months around 138-144 lb. Pt would like to lose 5-8 lb more "even though I wasn't trying to lose wt on chemo." Pt states she eats out frequently for dinner "because I'm tired at 6:30 and I don't want to have to clean up the kitchen." Pt expressed understanding of the information reviewed. Pt aware of nutrition education classes offered.  Nutrition Diagnosis ? Food-and nutrition-related knowledge deficit related to lack of exposure to information as related to diagnosis of: ? CVD  Nutrition Intervention ? Pt's individual nutrition plan and goals reviewed with pt. ? Pt given handouts for:? Nutrition II class   Nutrition Goal(s):  ? Pt to identify food quantities necessary to achieve weight loss of 5-8 lb at graduation from cardiac rehab. Goal wt of 130 lb desired.   Plan:  Pt to attend nutrition classes ? Nutrition I ? Nutrition II ? Portion Distortion  Will provide client-centered nutrition education as part of interdisciplinary care.   Monitor and evaluate progress toward nutrition goal with team.  Derek Mound, M.Ed, RD, LDN, CDE 03/28/2017 3:27 PM

## 2017-03-28 NOTE — Progress Notes (Signed)
Cardiac Individual Treatment Plan  Patient Details  Name: [name] MRN: [id number] Date of Birth: [birth year] Referring Provider:     [facility] from 03/28/2017 in [place]  Referring Provider  Daneen Schick MD      Initial Encounter Date:    CARDIAC REHAB PHASE II ORIENTATION from 03/28/2017 in [place]  Date  03/28/17  Referring Provider  Daneen Schick MD      Visit Diagnosis: NSTEMI (non-ST elevated myocardial infarction) ([place]) 12/31/16  S/P coronary artery stent placement 01/01/17  Patient's Home Medications on Admission:  Current Outpatient Medications:  .  albuterol (PROVENTIL HFA;VENTOLIN HFA) 108 (90 Base) MCG/ACT inhaler, Inhale 1-2 puffs into the lungs every 6 (six) hours as needed for wheezing or shortness of breath. Started in 1998, Disp: , Rfl:  .  anastrozole (ARIMIDEX) 1 MG tablet, Take 1 tablet (1 mg total) by mouth daily., Disp: 90 tablet, Rfl: 4 .  aspirin EC 81 MG EC tablet, Take 1 tablet (81 mg total) by mouth daily., Disp: 30 tablet, Rfl: 0 .  azithromycin (ZITHROMAX Z-PAK) 250 MG tablet, Take 250 mg daily by mouth. 500 mg PO x1 on day 1, then 250 mg PO q24h x4 days, Disp: , Rfl:  .  buPROPion (WELLBUTRIN XL) 300 MG 24 hr tablet, Take 300 mg by mouth daily. , Disp: , Rfl:  .  Calcium Carbonate-Vitamin D (CALCIUM + D PO), Take 1 tablet by mouth at bedtime. Started approximately 1998, Disp: , Rfl:  .  carisoprodol (SOMA) 350 MG tablet, Take 350 mg by mouth 2 (two) times daily as needed for muscle spasms. Started in 2008., Disp: , Rfl: 2 .  diazepam (VALIUM) 5 MG tablet, Take 5 mg by mouth every 6 (six) hours as needed for muscle spasms (interstitial cystitis). Started approximately 2008., Disp: , Rfl:  .  ezetimibe (ZETIA) 10 MG tablet, Take 1 tablet (10 mg total) by mouth daily., Disp: 30 tablet, Rfl: 11 .  fluconazole (DIFLUCAN) 100 MG tablet, Take 1 tablet (100 mg total)  by mouth daily., Disp: 30 tablet, Rfl: 3 .  furosemide (LASIX) 20 MG tablet, Take 1 tablet (20 mg total) by mouth daily as needed (shortness of breath)., Disp: 30 tablet, Rfl: 0 .  HYDROcodone-acetaminophen (NORCO) 10-325 MG tablet, Take 1 tablet by mouth every 4 (four) hours as needed for moderate pain or severe pain (as needed for fibromyalgia and IC). , Disp: , Rfl:  .  hydrOXYzine (VISTARIL) 25 MG capsule, Take 25 mg by mouth 3 times/day as needed-between meals & bedtime. , Disp: , Rfl:  .  levothyroxine (SYNTHROID) 137 MCG tablet, Take 137 mcg by mouth daily. , Disp: , Rfl:  .  LORazepam (ATIVAN) 2 MG tablet, Take 1 tablet by mouth daily as needed for anxiety. , Disp: , Rfl: 5 .  metoprolol succinate (TOPROL-XL) 25 MG 24 hr tablet, Take 0.5 tablets (12.5 mg total) by mouth daily., Disp: 15 tablet, Rfl: 6 .  minoxidil (ROGAINE) 2 % external solution, Apply topically 2 (two) times daily., Disp: , Rfl:  .  Multiple Vitamin (MULTIVITAMIN) capsule, Take 1 capsule by mouth daily. Taking since at least 1988., Disp: , Rfl:  .  nitroGLYCERIN (NITROSTAT) 0.4 MG SL tablet, Place 1 tablet (0.4 mg total) under the tongue every 5 (five) minutes as needed for chest pain., Disp: 25 tablet, Rfl: 3 .  NONFORMULARY OR COMPOUNDED ITEM, Place 1 suppository vaginally daily. compounded  medication - vitamin E, Disp: , Rfl:  .  ondansetron (ZOFRAN) 4 MG tablet, Take 1 tablet by mouth daily as needed for nausea or vomiting. , Disp: , Rfl: 3 .  pantoprazole (PROTONIX) 40 MG tablet, Take 40 mg by mouth 2 (two) times daily. Started in 2012., Disp: , Rfl:  .  potassium chloride (K-DUR) 10 MEQ tablet, TAKE 1 TABLET BY MOUTH AS NEEDED. TAKE WITH LASIX, Disp: 30 tablet, Rfl: 11 .  predniSONE (DELTASONE) 10 MG tablet, Take by mouth. Taper by 10 mg daily, beginning at 60 mg on Day 1., Disp: , Rfl:  .  prochlorperazine (COMPAZINE) 10 MG tablet, Take 1 tablet (10 mg total) every 6 (six) hours as needed by mouth (Nausea or  vomiting)., Disp: 30 tablet, Rfl: 1 .  terconazole (TERAZOL 7) 0.4 % vaginal cream, Place 1 applicator vaginally 2 (two) times daily as needed (irritation). , Disp: , Rfl:  .  ticagrelor (BRILINTA) 90 MG TABS tablet, Take 1 tablet (90 mg total) by mouth 2 (two) times daily., Disp: 60 tablet, Rfl: 0 .  Vitamin D, Ergocalciferol, (DRISDOL) 50000 units CAPS capsule, Take 1 capsule (50,000 Units total) by mouth every 7 (seven) days., Disp: 12 capsule, Rfl: 3 .  zolpidem (AMBIEN) 10 MG tablet, Take 10 mg by mouth at bedtime as needed for sleep. , Disp: , Rfl:   Past Medical History: Past Medical History:  Diagnosis Date  . Anxiety   . Arthritis 2008  . Asthma 1953   age 42  . Breast cancer ([place])   . Cancer [facility])    breast cancer  . Chronic vaginitis 1998  . Fibromyalgia 1982  . GERD (gastroesophageal reflux disease)    since at least 2011  . Hemorrhoids 2009  . History of radiation therapy 01/18/16-03/05/16   right breast/nodal areas 45 Gy, right breast boost 16 Gy  . Hypoglycemia 2003  . Hypothyroidism 1964   age 74  . Interstitial cystitis 1998  . Irritable bowel syndrome 06/14/2009   Qualifier: Diagnosis of  By: Patsy Baltimore RN, Langley Gauss    . Menopausal symptoms   . Osteoporosis 12/24/2006  . Personal history of chemotherapy   . Personal history of radiation therapy   . Pneumonia 1979   varicella  . PONV (postoperative nausea and vomiting) 1979    Tobacco Use: Social History   Tobacco Use  Smoking Status Former Smoker  . Packs/day: 0.25  . Years: 15.00  . Pack years: 3.75  . Types: Cigarettes  Smokeless Tobacco Never Used  Tobacco Comment   social smoker/"on and off"    Labs: Recent Review Flowsheet Data    Labs for ITP Cardiac and Pulmonary Rehab Latest Ref Rng & Units 07/05/2016 11/28/2016 01/02/2017 01/03/2017   Cholestrol 0 - 200 mg/dL - - 224(H) 189   LDLCALC 0 - 99 mg/dL - - 128(H) 105(H)   HDL >40 mg/dL - - 77 67   Trlycerides <150 mg/dL - - 93 83   Hemoglobin A1c 4.8 -  5.6 % 5.2 5.2 - 4.9      Capillary Blood Glucose: No results found for: GLUCAP   Exercise Target Goals: Date: 03/28/17  Exercise Program Goal: Individual exercise prescription set with THRR, safety & activity barriers. Participant demonstrates ability to understand and report RPE using BORG scale, to self-measure pulse accurately, and to acknowledge the importance of the exercise prescription.  Exercise Prescription Goal: Starting with aerobic activity 30 plus minutes a day, 3 days per week for initial exercise prescription.  Provide home exercise prescription and guidelines that participant acknowledges understanding prior to discharge.  Activity Barriers & Risk Stratification: Activity Barriers & Cardiac Risk Stratification - 03/28/17 1424      Activity Barriers & Cardiac Risk Stratification   Activity Barriers  Fibromyalgia;Deconditioning;Back Problems;Other (comment);Joint Problems    Comments  Right Hip Pain    Cardiac Risk Stratification  Moderate       6 Minute Walk: 6 Minute Walk    Row Name 03/28/17 1438         6 Minute Walk   Phase  Initial     Distance  1710 feet     Walk Time  6 minutes     # of Rest Breaks  0     MPH  3.23     METS  3.59     RPE  12     Perceived Dyspnea   1     VO2 Peak  12.58     Symptoms  Yes (comment)     Comments  Mild SOB      Resting HR  62 bpm     Resting BP  122/70     Resting Oxygen Saturation   97 %     Exercise Oxygen Saturation  during 6 min walk  96 %     Max Ex. HR  100 bpm     Max Ex. BP  130/78     2 Minute Post BP  112/70        Oxygen Initial Assessment:   Oxygen Re-Evaluation:   Oxygen Discharge (Final Oxygen Re-Evaluation):   Initial Exercise Prescription: Initial Exercise Prescription - 03/28/17 1400      Date of Initial Exercise RX and Referring Provider   Date  03/28/17    Referring Provider  Daneen Schick MD      Recumbant Bike   Level  2    Minutes  15    METs  2      NuStep   Level  3     SPM  70    Minutes  15    METs  2.5      Track   Laps  13    Minutes  15    METs  2.51      Prescription Details   Frequency (times per week)  3    Duration  Progress to 30 minutes of continuous aerobic without signs/symptoms of physical distress      Intensity   THRR 40-80% of Max Heartrate  57-114    Ratings of Perceived Exertion  11-13    Perceived Dyspnea  0-4      Progression   Progression  Continue to progress workloads to maintain intensity without signs/symptoms of physical distress.      Resistance Training   Training Prescription  Yes    Weight  3lbs    Reps  10-15       Perform Capillary Blood Glucose checks as needed.  Exercise Prescription Changes:   Exercise Comments:   Exercise Goals and Review:  Exercise Goals    Row Name 03/28/17 1425             Exercise Goals   Increase Physical Activity  Yes       Intervention  Provide advice, education, support and counseling about physical activity/exercise needs.;Develop an individualized exercise prescription for aerobic and resistive training based on initial evaluation findings, risk stratification, comorbidities and participant's personal goals.  Expected Outcomes  Achievement of increased cardiorespiratory fitness and enhanced flexibility, muscular endurance and strength shown through measurements of functional capacity and personal statement of participant.       Increase Strength and Stamina  Yes Improve Muscular Strength, Walking Tolerance, and Motivation to Fitness       Intervention  Provide advice, education, support and counseling about physical activity/exercise needs.;Develop an individualized exercise prescription for aerobic and resistive training based on initial evaluation findings, risk stratification, comorbidities and participant's personal goals.       Expected Outcomes  Achievement of increased cardiorespiratory fitness and enhanced flexibility, muscular endurance and strength  shown through measurements of functional capacity and personal statement of participant.       Able to understand and use rate of perceived exertion (RPE) scale  Yes       Intervention  Provide education and explanation on how to use RPE scale       Expected Outcomes  Short Term: Able to use RPE daily in rehab to express subjective intensity level;Long Term:  Able to use RPE to guide intensity level when exercising independently       Knowledge and understanding of Target Heart Rate Range (THRR)  Yes       Intervention  Provide education and explanation of THRR including how the numbers were predicted and where they are located for reference       Expected Outcomes  Short Term: Able to state/look up THRR;Long Term: Able to use THRR to govern intensity when exercising independently;Short Term: Able to use daily as guideline for intensity in rehab       Able to check pulse independently  Yes       Intervention  Provide education and demonstration on how to check pulse in carotid and radial arteries.;Review the importance of being able to check your own pulse for safety during independent exercise       Expected Outcomes  Short Term: Able to explain why pulse checking is important during independent exercise;Long Term: Able to check pulse independently and accurately       Understanding of Exercise Prescription  Yes       Intervention  Provide education, explanation, and written materials on patient's individual exercise prescription       Expected Outcomes  Short Term: Able to explain program exercise prescription;Long Term: Able to explain home exercise prescription to exercise independently          Exercise Goals Re-Evaluation :    Discharge Exercise Prescription (Final Exercise Prescription Changes):   Nutrition:  Target Goals: Understanding of nutrition guidelines, daily intake of sodium 1500mg , cholesterol 200mg , calories 30% from fat and 7% or less from saturated fats, daily to have 5  or more servings of fruits and vegetables.  Biometrics: Pre Biometrics - 03/28/17 1437      Pre Biometrics   Height  5' 3.5" (1.613 m)    Weight  139 lb 12.4 oz (63.4 kg)    Waist Circumference  32 inches    Hip Circumference  40 inches    Waist to Hip Ratio  0.8 %    BMI (Calculated)  24.37    Triceps Skinfold  30 mm    % Body Fat  36.9 %    Grip Strength  26 kg    Flexibility  0 in    Single Leg Stand  1.21 seconds        Nutrition Therapy Plan and Nutrition Goals: Nutrition Therapy & Goals -  03/28/17 1535      Nutrition Therapy   Diet  Heart Healthy      Personal Nutrition Goals   Nutrition Goal  Pt to identify food quantities necessary to achieve weight loss of 5-8 lb at graduation from cardiac rehab. Goal wt of 130 lb desired.       Intervention Plan   Intervention  Prescribe, educate and counsel regarding individualized specific dietary modifications aiming towards targeted core components such as weight, hypertension, lipid management, diabetes, heart failure and other comorbidities.    Expected Outcomes  Short Term Goal: Understand basic principles of dietary content, such as calories, fat, sodium, cholesterol and nutrients.;Long Term Goal: Adherence to prescribed nutrition plan.       Nutrition Discharge: Nutrition Scores:   Nutrition Goals Re-Evaluation:   Nutrition Goals Re-Evaluation:   Nutrition Goals Discharge (Final Nutrition Goals Re-Evaluation):   Psychosocial: Target Goals: Acknowledge presence or absence of significant depression and/or stress, maximize coping skills, provide positive support system. Participant is able to verbalize types and ability to use techniques and skills needed for reducing stress and depression.  Initial Review & Psychosocial Screening: Initial Psych Review & Screening - 03/28/17 1704      Initial Review   Current issues with  Current Stress Concerns;None Identified    Source of Stress Concerns  Chronic Illness  History of breast cancer      Family Dynamics   Good Support System?  Yes      Barriers   Psychosocial barriers to participate in program  There are no identifiable barriers or psychosocial needs.;The patient should benefit from training in stress management and relaxation.      Screening Interventions   Interventions  To provide support and resources with identified psychosocial needs;Provide feedback about the scores to participant       Quality of Life Scores: Quality of Life - 03/28/17 1632      Quality of Life Scores   Health/Function Pre  28 %    Socioeconomic Pre  30 %    Psych/Spiritual Pre  29.14 %    Family Pre  30 %    GLOBAL Pre  28.94 %       PHQ-9: Recent Review Flowsheet Data    Depression screen [facility] 2/9 10/15/2016 04/05/2016 12/21/2015   Decreased Interest 0 0 0   Down, Depressed, Hopeless 0 0 -   PHQ - 2 Score 0 0 0     Interpretation of Total Score  Total Score Depression Severity:  1-4 = Minimal depression, 5-9 = Mild depression, 10-14 = Moderate depression, 15-19 = Moderately severe depression, 20-27 = Severe depression   Psychosocial Evaluation and Intervention:   Psychosocial Re-Evaluation:   Psychosocial Discharge (Final Psychosocial Re-Evaluation):   Vocational Rehabilitation: Provide vocational rehab assistance to qualifying candidates.   Vocational Rehab Evaluation & Intervention: Vocational Rehab - 03/28/17 1703      Initial Vocational Rehab Evaluation & Intervention   Assessment shows need for Vocational Rehabilitation  No Zaya does not need vocational rehab for job retriaining.       Education: Education Goals: Education classes will be provided on a weekly basis, covering required topics. Participant will state understanding/return demonstration of topics presented.  Learning Barriers/Preferences: Learning Barriers/Preferences - 03/28/17 1422      Learning Barriers/Preferences   Learning Barriers  Sight Cataract Surgery and  Readers    Learning Preferences  Audio;Written Material;Video;Verbal Instruction;Skilled Demonstration;Individual Instruction;Pictoral;Computer/Internet;Group Instruction       Education Topics: Count Your Pulse:  -  Group instruction provided by verbal instruction, demonstration, patient participation and written materials to support subject.  Instructors address importance of being able to find your pulse and how to count your pulse when at home without a heart monitor.  Patients get hands on experience counting their pulse with staff help and individually.   Heart Attack, Angina, and Risk Factor Modification:  -Group instruction provided by verbal instruction, video, and written materials to support subject.  Instructors address signs and symptoms of angina and heart attacks.    Also discuss risk factors for heart disease and how to make changes to improve heart health risk factors.   Functional Fitness:  -Group instruction provided by verbal instruction, demonstration, patient participation, and written materials to support subject.  Instructors address safety measures for doing things around the house.  Discuss how to get up and down off the floor, how to pick things up properly, how to safely get out of a chair without assistance, and balance training.   Meditation and Mindfulness:  -Group instruction provided by verbal instruction, patient participation, and written materials to support subject.  Instructor addresses importance of mindfulness and meditation practice to help reduce stress and improve awareness.  Instructor also leads participants through a meditation exercise.    Stretching for Flexibility and Mobility:  -Group instruction provided by verbal instruction, patient participation, and written materials to support subject.  Instructors lead participants through series of stretches that are designed to increase flexibility thus improving mobility.  These stretches are additional  exercise for major muscle groups that are typically performed during regular warm up and cool down.   Hands Only CPR:  -Group verbal, video, and participation provides a basic overview of AHA guidelines for community CPR. Role-play of emergencies allow participants the opportunity to practice calling for help and chest compression technique with discussion of AED use.   Hypertension: -Group verbal and written instruction that provides a basic overview of hypertension including the most recent diagnostic guidelines, risk factor reduction with self-care instructions and medication management.    Nutrition I class: Heart Healthy Eating:  -Group instruction provided by PowerPoint slides, verbal discussion, and written materials to support subject matter. The instructor gives an explanation and review of the Therapeutic Lifestyle Changes diet recommendations, which includes a discussion on lipid goals, dietary fat, sodium, fiber, plant stanol/sterol esters, sugar, and the components of a well-balanced, healthy diet.   Nutrition II class: Lifestyle Skills:  -Group instruction provided by PowerPoint slides, verbal discussion, and written materials to support subject matter. The instructor gives an explanation and review of label reading, grocery shopping for heart health, heart healthy recipe modifications, and ways to make healthier choices when eating out.   CARDIAC REHAB PHASE II ORIENTATION from 03/28/2017 in [place]  Date  03/28/17 [place] handouts given]  Educator  RD  Instruction Review Code  Not applicable      Diabetes Question & Answer:  -Group instruction provided by PowerPoint slides, verbal discussion, and written materials to support subject matter. The instructor gives an explanation and review of diabetes co-morbidities, pre- and post-prandial blood glucose goals, pre-exercise blood glucose goals, signs, symptoms, and treatment of hypoglycemia and  hyperglycemia, and foot care basics.   Diabetes Blitz:  -Group instruction provided by PowerPoint slides, verbal discussion, and written materials to support subject matter. The instructor gives an explanation and review of the physiology behind type 1 and type 2 diabetes, diabetes medications and rational behind using different medications, pre- and  post-prandial blood glucose recommendations and Hemoglobin A1c goals, diabetes diet, and exercise including blood glucose guidelines for exercising safely.    Portion Distortion:  -Group instruction provided by PowerPoint slides, verbal discussion, written materials, and food models to support subject matter. The instructor gives an explanation of serving size versus portion size, changes in portions sizes over the last 20 years, and what consists of a serving from each food group.   Stress Management:  -Group instruction provided by verbal instruction, video, and written materials to support subject matter.  Instructors review role of stress in heart disease and how to cope with stress positively.     Exercising on Your Own:  -Group instruction provided by verbal instruction, power point, and written materials to support subject.  Instructors discuss benefits of exercise, components of exercise, frequency and intensity of exercise, and end points for exercise.  Also discuss use of nitroglycerin and activating EMS.  Review options of places to exercise outside of rehab.  Review guidelines for sex with heart disease.   Cardiac Drugs I:  -Group instruction provided by verbal instruction and written materials to support subject.  Instructor reviews cardiac drug classes: antiplatelets, anticoagulants, beta blockers, and statins.  Instructor discusses reasons, side effects, and lifestyle considerations for each drug class.   Cardiac Drugs II:  -Group instruction provided by verbal instruction and written materials to support subject.  Instructor  reviews cardiac drug classes: angiotensin converting enzyme inhibitors (ACE-I), angiotensin II receptor blockers (ARBs), nitrates, and calcium channel blockers.  Instructor discusses reasons, side effects, and lifestyle considerations for each drug class.   Anatomy and Physiology of the Circulatory System:  Group verbal and written instruction and models provide basic cardiac anatomy and physiology, with the coronary electrical and arterial systems. Review of: AMI, Angina, Valve disease, Heart Failure, Peripheral Artery Disease, Cardiac Arrhythmia, Pacemakers, and the ICD.   Other Education:  -Group or individual verbal, written, or video instructions that support the educational goals of the cardiac rehab program.   Knowledge Questionnaire Score: Knowledge Questionnaire Score - 03/28/17 1626      Knowledge Questionnaire Score   Pre Score  23/24       Core Components/Risk Factors/Patient Goals at Admission: Personal Goals and Risk Factors at Admission - 03/28/17 1709      Core Components/Risk Factors/Patient Goals on Admission    Weight Management  Yes;Weight Maintenance    Intervention  Weight Management: Develop a combined nutrition and exercise program designed to reach desired caloric intake, while maintaining appropriate intake of nutrient and fiber, sodium and fats, and appropriate energy expenditure required for the weight goal.;Weight Management: Provide education and appropriate resources to help participant work on and attain dietary goals.;Weight Management/Obesity: Establish reasonable short term and long term weight goals.    Admit Weight  139 lb 12.4 oz (63.4 kg)    Goal Weight: Short Term  139 lb (63 kg)    Goal Weight: Long Term  139 lb (63 kg)    Expected Outcomes  Short Term: Continue to assess and modify interventions until short term weight is achieved;Long Term: Adherence to nutrition and physical activity/exercise program aimed toward attainment of established weight  goal;Weight Maintenance: Understanding of the daily nutrition guidelines, which includes 25-35% calories from fat, 7% or less cal from saturated fats, less than 200mg  cholesterol, less than 1.5gm of sodium, & 5 or more servings of fruits and vegetables daily    Hypertension  Yes    Intervention  Provide education on lifestyle modifcations  including regular physical activity/exercise, weight management, moderate sodium restriction and increased consumption of fresh fruit, vegetables, and low fat dairy, alcohol moderation, and smoking cessation.;Monitor prescription use compliance.    Expected Outcomes  Short Term: Continued assessment and intervention until BP is < 140/57mm HG in hypertensive participants. < 130/1mm HG in hypertensive participants with diabetes, heart failure or chronic kidney disease.;Long Term: Maintenance of blood pressure at goal levels.    Lipids  Yes    Intervention  Provide education and support for participant on nutrition & aerobic/resistive exercise along with prescribed medications to achieve LDL 70mg , HDL >40mg .    Expected Outcomes  Short Term: Participant states understanding of desired cholesterol values and is compliant with medications prescribed. Participant is following exercise prescription and nutrition guidelines.;Long Term: Cholesterol controlled with medications as prescribed, with individualized exercise RX and with personalized nutrition plan. Value goals: LDL < 70mg , HDL > 40 mg.    Stress  Yes    Intervention  Offer individual and/or small group education and counseling on adjustment to heart disease, stress management and health-related lifestyle change. Teach and support self-help strategies.;Refer participants experiencing significant psychosocial distress to appropriate mental health specialists for further evaluation and treatment. When possible, include family members and significant others in education/counseling sessions.    Expected Outcomes  Short  Term: Participant demonstrates changes in health-related behavior, relaxation and other stress management skills, ability to obtain effective social support, and compliance with psychotropic medications if prescribed.;Long Term: Emotional wellbeing is indicated by absence of clinically significant psychosocial distress or social isolation.       Core Components/Risk Factors/Patient Goals Review:    Core Components/Risk Factors/Patient Goals at Discharge (Final Review):    ITP Comments: ITP Comments    Row Name 03/28/17 1305           ITP Comments  Dr Fransico Him, Medical Director          Comments: [name] attended orientation from 1337 to 1615 to review rules and guidelines for program. Completed 6 minute walk test, Intitial ITP, and exercise prescription.  VSS. Telemetry-Normal Sinus Rhtyhm.  Asymptomatic.Barnet Pall, RN,BSN 03/28/2017 5:13 PM

## 2017-04-03 ENCOUNTER — Encounter (HOSPITAL_COMMUNITY)
Admission: RE | Admit: 2017-04-03 | Discharge: 2017-04-03 | Disposition: A | Discharge status: Home / Self Care | Payer: Medicare Other | Source: Ambulatory Visit | Attending: Interventional Cardiology | Admitting: Interventional Cardiology

## 2017-04-03 ENCOUNTER — Telehealth (HOSPITAL_COMMUNITY): Payer: Self-pay | Admitting: *Deleted

## 2017-04-03 ENCOUNTER — Encounter (HOSPITAL_COMMUNITY): Payer: Medicare Other

## 2017-04-03 DIAGNOSIS — Z955 Presence of coronary angioplasty implant and graft: Secondary | ICD-10-CM | POA: Diagnosis present

## 2017-04-03 DIAGNOSIS — I214 Non-ST elevation (NSTEMI) myocardial infarction: Secondary | ICD-10-CM | POA: Insufficient documentation

## 2017-04-03 NOTE — Progress Notes (Signed)
Daily Session Note  Patient Details  Name: [name] MRN: [id number] Date of Birth: 10-21-1945 Referring Provider:     [place] from 03/28/2017 in [place]  Referring Provider  Daneen Schick MD      Encounter Date: 04/03/2017  Check In: Session Check In - 04/03/17 1217      Check-In   Location  [facility]    Staff Present  Seward Carol, MS, ACSM CEP, Exercise Physiologist;Tyara Carol Ada, MS,ACSM CEP, Exercise Physiologist;Amber Fair, MS, ACSM RCEP, Exercise Physiologist;Joann Rion, RN, Marga Melnick, RN, BSN    Supervising physician immediately available to respond to emergencies  [facility] immediately available    Physician(s)  Dr. Starla Link     Medication changes reported      No    Fall or balance concerns reported     No    Tobacco Cessation  No Change    Warm-up and Cool-down  Performed as group-led instruction    Resistance Training Performed  No    VAD Patient?  No      Pain Assessment   Currently in Pain?  No/denies       Capillary Blood Glucose: No results found for this or any previous visit (from the past 24 hour(s)).    Social History   Tobacco Use  Smoking Status Former Smoker  . Packs/day: 0.25  . Years: 15.00  . Pack years: 3.75  . Types: Cigarettes  Smokeless Tobacco Never Used  Tobacco Comment   social smoker/"on and off"    Goals Met:  Exercise tolerated well  Goals Unmet:  Not Applicable  Comments: Jenine started cardiac rehab today.  Pt tolerated light exercise without difficulty. VSS, telemetry-Sinus Rhtyhm, asymptomatic.  Medication list reconciled. Pt denies barriers to medicaiton compliance.  PSYCHOSOCIAL ASSESSMENT:  PHQ-0. Pt exhibits positive coping skills, hopeful outlook with supportive family. No psychosocial needs identified at this time, no psychosocial interventions necessary.    Pt enjoys spending time with her family and pets, writing  and traveling.   Pt oriented to exercise equipment and routine.    Understanding verbalized. Kayonna showed me a large bruise on there left great toe and a resolving bruise to the top of her left foot. Jaleen said the bruise has been there for 3 days. Babbette denied having pain today during exercise. Dr Polite's office called.Appontment made for [name] to see Dianna Rossetti NP today at 1:45pm. No complaints voiced today during exercise at cardiac rehab.Barnet Pall, RN,BSN 04/03/2017 4:58 PM    Dr. Fransico Him is Medical Director for Cardiac Rehab at [facility].

## 2017-04-04 ENCOUNTER — Other Ambulatory Visit: Payer: Self-pay | Admitting: *Deleted

## 2017-04-05 ENCOUNTER — Encounter (HOSPITAL_COMMUNITY): Payer: Medicare Other

## 2017-04-05 ENCOUNTER — Encounter (HOSPITAL_COMMUNITY)
Admission: RE | Admit: 2017-04-05 | Discharge: 2017-04-05 | Disposition: A | Discharge status: Home / Self Care | Payer: Medicare Other | Source: Ambulatory Visit | Attending: Interventional Cardiology | Admitting: Interventional Cardiology

## 2017-04-05 DIAGNOSIS — Z955 Presence of coronary angioplasty implant and graft: Secondary | ICD-10-CM

## 2017-04-05 DIAGNOSIS — I214 Non-ST elevation (NSTEMI) myocardial infarction: Secondary | ICD-10-CM | POA: Diagnosis not present

## 2017-04-08 ENCOUNTER — Encounter (HOSPITAL_COMMUNITY): Payer: Medicare Other

## 2017-04-10 ENCOUNTER — Encounter (HOSPITAL_COMMUNITY): Payer: Medicare Other

## 2017-04-10 ENCOUNTER — Encounter (HOSPITAL_COMMUNITY)
Admission: RE | Admit: 2017-04-10 | Discharge: 2017-04-10 | Disposition: A | Discharge status: Home / Self Care | Payer: Medicare Other | Source: Ambulatory Visit | Attending: Interventional Cardiology | Admitting: Interventional Cardiology

## 2017-04-10 DIAGNOSIS — Z955 Presence of coronary angioplasty implant and graft: Secondary | ICD-10-CM

## 2017-04-10 DIAGNOSIS — I214 Non-ST elevation (NSTEMI) myocardial infarction: Secondary | ICD-10-CM | POA: Diagnosis not present

## 2017-04-12 ENCOUNTER — Encounter (HOSPITAL_COMMUNITY): Payer: Medicare Other

## 2017-04-12 ENCOUNTER — Encounter (HOSPITAL_COMMUNITY)
Admission: RE | Admit: 2017-04-12 | Discharge: 2017-04-12 | Disposition: A | Discharge status: Home / Self Care | Payer: Medicare Other | Source: Ambulatory Visit | Attending: Interventional Cardiology | Admitting: Interventional Cardiology

## 2017-04-12 DIAGNOSIS — I214 Non-ST elevation (NSTEMI) myocardial infarction: Secondary | ICD-10-CM

## 2017-04-12 DIAGNOSIS — Z955 Presence of coronary angioplasty implant and graft: Secondary | ICD-10-CM

## 2017-04-15 ENCOUNTER — Encounter (HOSPITAL_COMMUNITY): Payer: Medicare Other

## 2017-04-15 ENCOUNTER — Encounter (HOSPITAL_COMMUNITY)
Admission: RE | Admit: 2017-04-15 | Discharge: 2017-04-15 | Disposition: A | Discharge status: Home / Self Care | Payer: Medicare Other | Source: Ambulatory Visit | Attending: Interventional Cardiology | Admitting: Interventional Cardiology

## 2017-04-15 DIAGNOSIS — I214 Non-ST elevation (NSTEMI) myocardial infarction: Secondary | ICD-10-CM

## 2017-04-15 DIAGNOSIS — Z955 Presence of coronary angioplasty implant and graft: Secondary | ICD-10-CM

## 2017-04-17 ENCOUNTER — Ambulatory Visit (INDEPENDENT_AMBULATORY_CARE_PROVIDER_SITE_OTHER): Payer: Medicare Other | Admitting: Interventional Cardiology

## 2017-04-17 ENCOUNTER — Encounter (HOSPITAL_COMMUNITY)
Admission: RE | Admit: 2017-04-17 | Discharge: 2017-04-17 | Disposition: A | Discharge status: Home / Self Care | Payer: Medicare Other | Source: Ambulatory Visit | Attending: Interventional Cardiology | Admitting: Interventional Cardiology

## 2017-04-17 ENCOUNTER — Encounter: Payer: Self-pay | Admitting: Interventional Cardiology

## 2017-04-17 ENCOUNTER — Encounter (HOSPITAL_COMMUNITY): Payer: Medicare Other

## 2017-04-17 VITALS — BP 136/80 | HR 62 | Ht 63.5 in | Wt 139.0 lb

## 2017-04-17 DIAGNOSIS — I2510 Atherosclerotic heart disease of native coronary artery without angina pectoris: Secondary | ICD-10-CM | POA: Diagnosis not present

## 2017-04-17 DIAGNOSIS — I200 Unstable angina: Secondary | ICD-10-CM

## 2017-04-17 DIAGNOSIS — I10 Essential (primary) hypertension: Secondary | ICD-10-CM

## 2017-04-17 DIAGNOSIS — Z955 Presence of coronary angioplasty implant and graft: Secondary | ICD-10-CM

## 2017-04-17 DIAGNOSIS — I214 Non-ST elevation (NSTEMI) myocardial infarction: Secondary | ICD-10-CM

## 2017-04-17 DIAGNOSIS — I5032 Chronic diastolic (congestive) heart failure: Secondary | ICD-10-CM | POA: Diagnosis not present

## 2017-04-17 DIAGNOSIS — I252 Old myocardial infarction: Secondary | ICD-10-CM | POA: Diagnosis not present

## 2017-04-17 MED ORDER — POTASSIUM CHLORIDE ER 10 MEQ PO TBCR: EXTENDED_RELEASE_TABLET | ORAL | 11 refills | Status: DC

## 2017-04-17 MED ORDER — FUROSEMIDE 20 MG PO TABS: 20.0000 mg | ORAL_TABLET | Freq: Every day | ORAL | 3 refills | Status: DC | PRN

## 2017-04-17 NOTE — Patient Instructions (Signed)

## 2017-04-17 NOTE — Progress Notes (Signed)
Cardiology Office Note   Date:  04/18/2017   ID:  Brianna, [name] 10/09/1945, MRN [id number]  PCP:  Seward Carol, MD    No chief complaint on file. CAD   Wt Readings from Last 3 Encounters:  04/17/17 139 lb (63 kg)  03/28/17 139 lb 12.4 oz (63.4 kg)  03/11/17 138 lb 9.6 oz (62.9 kg)       History of Present Illness: [name] is a 71 y.o. female  with history of CAD status post NSTEMI 01/01/17 treated with DES 4 to the circumflex, mid LAD, mid RCA, and distal RCA  Patient has history of hypertension, breast CA status post lumpectomy chemotherapy and radiation, anxiety, fibromyalgia and hypothyroidism.  Seen on 01/18/17 with shortness of breath and BNP was up to 415. She was given some Lasix to take for a week and breathing improved. She thought it could be asthma related also. 2-D echo 01/02/17 showed normal LVEF 60-65% with grade 1 DD.She wen to [place].  Denies : Chest pain. Dizziness. Leg edema. Nitroglycerin use. Orthopnea. Palpitations. Paroxysmal nocturnal dyspnea. Shortness of breath. Syncope.   She requires a Lasix occasionally.  She had stomach pain with atorvastatin.  She started on Zetia.  She is tolerating this well.  Past Medical History:  Diagnosis Date  . Anxiety   . Arthritis 2008  . Asthma 1953   age 22  . Breast cancer ([place])   . Cancer [facility])    breast cancer  . Chronic vaginitis 1998  . Fibromyalgia 1982  . GERD (gastroesophageal reflux disease)    since at least 2011  . Hemorrhoids 2009  . History of radiation therapy 01/18/16-03/05/16   right breast/nodal areas 45 Gy, right breast boost 16 Gy  . Hypoglycemia 2003  . Hypothyroidism 1964   age 41  . Interstitial cystitis 1998  . Irritable bowel syndrome 06/14/2009   Qualifier: Diagnosis of  By: Patsy Baltimore RN, Langley Gauss    . Menopausal symptoms   . Osteoporosis 12/24/2006  . Personal history of chemotherapy   . Personal history of radiation therapy   . Pneumonia 1979   varicella  .  PONV (postoperative nausea and vomiting) 1979    Past Surgical History:  Procedure Laterality Date  . BLADDER HYPEREXTENSION     X4, prior to 01/2006  . BLADDER SUSPENSION    . BREAST BIOPSY    . BREAST LUMPECTOMY Right    2017  . BREAST LUMPECTOMY WITH NEEDLE LOCALIZATION AND AXILLARY LYMPH NODE DISSECTION Right 12/05/2015   Procedure: RIGHT BREAST NEEDLE LOCALIZED (WIRES X 2) LUMPECTOMY WITH AXILLARY LYMPH NODE DISSECTION;  Surgeon: Fanny Skates, MD;  Location: [place];  Service: General;  Laterality: Right;  . BREAST LUMPECTOMY WITH RADIOACTIVE SEED LOCALIZATION Left 12/05/2015   Procedure: RADIOACTIVE SEED GUIDED LEFT BREAST LUMPECTOMY;  Surgeon: Fanny Skates, MD;  Location: [place];  Service: General;  Laterality: Left;  . BREAST SURGERY    . BUNIONECTOMY     prior to 01/2006  . CARDIAC CATHETERIZATION    . CESAREAN SECTION  1979  . CHOLECYSTECTOMY  02/08/2006   DR. INGRAM  . COLONOSCOPY    . CORONARY STENT INTERVENTION N/A 01/01/2017   Procedure: CORONARY STENT INTERVENTION;  Surgeon: Jettie Booze, MD;  Location: [place] [facility];  Service: Cardiovascular;  Laterality: N/A;  . DENTAL SURGERY  2009-2016   13 dental implants  . LEFT HEART CATH AND CORONARY ANGIOGRAPHY N/A 01/01/2017   Procedure: LEFT HEART CATH AND CORONARY  ANGIOGRAPHY;  Surgeon: Jettie Booze, MD;  Location: [place] [facility];  Service: Cardiovascular;  Laterality: N/A;  . LEG SURGERY  07/06/2004   Broken tibia  . NASAL SINUS SURGERY     X4; prior to 01/2006  . PORT-A-CATH REMOVAL Right 12/05/2015   Procedure: REMOVAL PORT-A-CATH;  Surgeon: Fanny Skates, MD;  Location: [place];  Service: General;  Laterality: Right;  . PORTACATH PLACEMENT Left 09/08/2015   Procedure: INSERTION OF PORT-A-CATH ;  Surgeon: Fanny Skates, MD;  Location: [place];  Service: General;  Laterality: Left;     Current Outpatient Medications  Medication Sig Dispense Refill  . albuterol (PROVENTIL HFA;VENTOLIN HFA) 108 (90  Base) MCG/ACT inhaler Inhale 1-2 puffs into the lungs every 6 (six) hours as needed for wheezing or shortness of breath. Started in 1998    . anastrozole (ARIMIDEX) 1 MG tablet Take 1 tablet (1 mg total) by mouth daily. 90 tablet 4  . aspirin EC 81 MG EC tablet Take 1 tablet (81 mg total) by mouth daily. 30 tablet 0  . buPROPion (WELLBUTRIN XL) 300 MG 24 hr tablet Take 300 mg by mouth daily.     . Calcium Carbonate-Vitamin D (CALCIUM + D PO) Take 1 tablet by mouth at bedtime. Started approximately 1998    . carisoprodol (SOMA) 350 MG tablet Take 350 mg by mouth 2 (two) times daily as needed for muscle spasms. Started in 2008.  2  . diazepam (VALIUM) 5 MG tablet Take 5 mg by mouth every 6 (six) hours as needed for muscle spasms (interstitial cystitis). Started approximately 2008.    . fluconazole (DIFLUCAN) 100 MG tablet Take 1 tablet (100 mg total) by mouth daily. 30 tablet 3  . furosemide (LASIX) 20 MG tablet Take 1 tablet (20 mg total) by mouth daily as needed (shortness of breath). 30 tablet 3  . HYDROcodone-acetaminophen (NORCO) 10-325 MG tablet Take 1 tablet by mouth every 4 (four) hours as needed for moderate pain or severe pain (as needed for fibromyalgia and IC).     . hydrOXYzine (VISTARIL) 25 MG capsule Take 25 mg by mouth 3 times/day as needed-between meals & bedtime.     Marland Kitchen levothyroxine (SYNTHROID) 137 MCG tablet Take 137 mcg by mouth daily.     Marland Kitchen LORazepam (ATIVAN) 2 MG tablet Take 1 tablet by mouth daily as needed for anxiety.   5  . metoprolol succinate (TOPROL-XL) 25 MG 24 hr tablet Take 0.5 tablets (12.5 mg total) by mouth daily. 15 tablet 6  . minoxidil (ROGAINE) 2 % external solution Apply topically 2 (two) times daily.    . Multiple Vitamin (MULTIVITAMIN) capsule Take 1 capsule by mouth daily. Taking since at least 1988.    . nitroGLYCERIN (NITROSTAT) 0.4 MG SL tablet Place 1 tablet (0.4 mg total) under the tongue every 5 (five) minutes as needed for chest pain. 25 tablet 3  .  NONFORMULARY OR COMPOUNDED ITEM Place 1 suppository vaginally daily. compounded medication - vitamin E    . ondansetron (ZOFRAN) 4 MG tablet Take 1 tablet by mouth daily as needed for nausea or vomiting.   3  . pantoprazole (PROTONIX) 40 MG tablet Take 40 mg by mouth 2 (two) times daily. Started in 2012.    . potassium chloride (K-DUR) 10 MEQ tablet TAKE 1 TABLET BY MOUTH AS NEEDED. TAKE WITH LASIX 30 tablet 11  . prochlorperazine (COMPAZINE) 10 MG tablet Take 1 tablet (10 mg total) every 6 (six) hours as needed by mouth (  Nausea or vomiting). 30 tablet 1  . terconazole (TERAZOL 7) 0.4 % vaginal cream Place 1 applicator vaginally 2 (two) times daily as needed (irritation).     . ticagrelor (BRILINTA) 90 MG TABS tablet Take 1 tablet (90 mg total) by mouth 2 (two) times daily. 60 tablet 0  . Vitamin D, Ergocalciferol, (DRISDOL) 50000 units CAPS capsule Take 1 capsule (50,000 Units total) by mouth every 7 (seven) days. 12 capsule 3  . zolpidem (AMBIEN) 10 MG tablet Take 10 mg by mouth at bedtime as needed for sleep.     Marland Kitchen ezetimibe (ZETIA) 10 MG tablet Take 1 tablet (10 mg total) by mouth daily. 30 tablet 11   No current facility-administered medications for this visit.     Allergies:   Penicillins; Chlorhexidine; and Other    Social History:  The patient  reports that she has quit smoking. Her smoking use included cigarettes. She has a 3.75 pack-year smoking history. she has never used smokeless tobacco. She reports that she drinks alcohol. She reports that she does not use drugs.   Family History:  The patient's family history includes Breast cancer (age of onset: 55) in her sister; Breast cancer (age of onset: 86) in her other; Cancer in her sister; Heart attack in her maternal grandfather; Heart disease in her mother, paternal grandmother, and sister; Hypertension in her mother; Kidney cancer (age of onset: 76) in her paternal uncle; Kidney failure (age of onset: 47) in her maternal grandmother;  Melanoma in her sister; Parkinsonism in her father, paternal aunt, and paternal uncle.    ROS:  Please see the history of present illness.   Otherwise, review of systems are positive for easy bruising.   All other systems are reviewed and negative.    PHYSICAL EXAM: VS:  BP 136/80   Pulse 62   Ht 5' 3.5" (1.613 m)   Wt 139 lb (63 kg)   LMP 04/30/1996 (Approximate)   SpO2 96%   BMI 24.24 kg/m  , BMI Body mass index is 24.24 kg/m. GEN: Well nourished, well developed, in no acute distress  HEENT: normal  Neck: no JVD, carotid bruits, or masses Cardiac: RRR; no murmurs, rubs, or gallops,no edema  Respiratory:  clear to auscultation bilaterally, normal work of breathing GI: soft, nontender, nondistended, + BS MS: no deformity or atrophy  Skin: warm and dry, no rash Neuro:  Strength and sensation are intact Psych: euthymic mood, full affect     Recent Labs: 01/02/2017: TSH 1.325 01/18/2017: NT-Pro BNP 415 03/11/2017: ALT 38; BUN 13.2; Creatinine 0.8; HGB 14.3; Platelets 331; Potassium 3.8; Sodium 138   Lipid Panel    Component Value Date/Time   CHOL 189 01/03/2017 0402   TRIG 83 01/03/2017 0402   HDL 67 01/03/2017 0402   CHOLHDL 2.8 01/03/2017 0402   VLDL 17 01/03/2017 0402   LDLCALC 105 (H) 01/03/2017 0402     Other studies Reviewed: Additional studies/ records that were reviewed today with results demonstrating: Prior cath  result reviewed.   ASSESSMENT AND PLAN:  1. CAD: s/p multivessel PCI. Continue DAPT for at least a year.  Likely clopidogrel indefinitely after that.  No angina at this time.  Continue aggressive secondary prevention.  Encouraged her to continue with regular exercise at cardiac rehab, and to continue this habit after cardiac rehab finishes. 2. HTN: Blood pressure well controlled.  Continue current blood pressure lowering medicines.  3. Chronic diastolic heart failure: Occasional lower extremity edema.  She uses Lasix as  needed.  When she takes the  Lasix, she also takes a potassium tablet.  She is not require Lasix more than once a week. 4. Hyperlipidemia: Consider adding a different statin.  She did not tolerate atorvastatin.     Current medicines are reviewed at length with the patient today.  The patient concerns regarding her medicines were addressed.  The following changes have been made:  No change  Labs/ tests ordered today include:  No orders of the defined types were placed in this encounter.   Recommend 150 minutes/week of aerobic exercise Low fat, low carb, high fiber diet recommended  Disposition:   FU in 6 months   Signed, Larae Grooms, MD  04/18/2017 10:24 AM    [facility] [place], [place], [place]  [address] Phone: [phone]; Fax: [phone]

## 2017-04-19 ENCOUNTER — Encounter (HOSPITAL_COMMUNITY): Payer: Medicare Other

## 2017-04-22 ENCOUNTER — Encounter (HOSPITAL_COMMUNITY): Payer: Medicare Other

## 2017-04-24 ENCOUNTER — Encounter (HOSPITAL_COMMUNITY): Payer: Medicare Other

## 2017-04-25 ENCOUNTER — Encounter (HOSPITAL_COMMUNITY): Payer: Self-pay | Admitting: *Deleted

## 2017-04-25 DIAGNOSIS — I214 Non-ST elevation (NSTEMI) myocardial infarction: Secondary | ICD-10-CM

## 2017-04-25 DIAGNOSIS — Z955 Presence of coronary angioplasty implant and graft: Secondary | ICD-10-CM

## 2017-04-25 NOTE — Progress Notes (Signed)
Cardiac Individual Treatment Plan  Patient Details  Name: [name] MRN: [id number] Date of Birth: [birth year] Referring Provider:     [place] from 03/28/2017 in [place]  Referring Provider  Daneen Schick MD      Initial Encounter Date:    CARDIAC REHAB PHASE II ORIENTATION from 03/28/2017 in [place]  Date  03/28/17  Referring Provider  Daneen Schick MD      Visit Diagnosis: NSTEMI (non-ST elevated myocardial infarction) ([place]) 12/31/16  S/P coronary artery stent placement 01/01/17  Patient's Home Medications on Admission:  Current Outpatient Medications:  .  albuterol (PROVENTIL HFA;VENTOLIN HFA) 108 (90 Base) MCG/ACT inhaler, Inhale 1-2 puffs into the lungs every 6 (six) hours as needed for wheezing or shortness of breath. Started in 1998, Disp: , Rfl:  .  anastrozole (ARIMIDEX) 1 MG tablet, Take 1 tablet (1 mg total) by mouth daily., Disp: 90 tablet, Rfl: 4 .  aspirin EC 81 MG EC tablet, Take 1 tablet (81 mg total) by mouth daily., Disp: 30 tablet, Rfl: 0 .  buPROPion (WELLBUTRIN XL) 300 MG 24 hr tablet, Take 300 mg by mouth daily. , Disp: , Rfl:  .  Calcium Carbonate-Vitamin D (CALCIUM + D PO), Take 1 tablet by mouth at bedtime. Started approximately 1998, Disp: , Rfl:  .  carisoprodol (SOMA) 350 MG tablet, Take 350 mg by mouth 2 (two) times daily as needed for muscle spasms. Started in 2008., Disp: , Rfl: 2 .  diazepam (VALIUM) 5 MG tablet, Take 5 mg by mouth every 6 (six) hours as needed for muscle spasms (interstitial cystitis). Started approximately 2008., Disp: , Rfl:  .  ezetimibe (ZETIA) 10 MG tablet, Take 1 tablet (10 mg total) by mouth daily., Disp: 30 tablet, Rfl: 11 .  fluconazole (DIFLUCAN) 100 MG tablet, Take 1 tablet (100 mg total) by mouth daily., Disp: 30 tablet, Rfl: 3 .  furosemide (LASIX) 20 MG tablet, Take 1 tablet (20 mg total) by mouth daily as needed (shortness of  breath)., Disp: 30 tablet, Rfl: 3 .  HYDROcodone-acetaminophen (NORCO) 10-325 MG tablet, Take 1 tablet by mouth every 4 (four) hours as needed for moderate pain or severe pain (as needed for fibromyalgia and IC). , Disp: , Rfl:  .  hydrOXYzine (VISTARIL) 25 MG capsule, Take 25 mg by mouth 3 times/day as needed-between meals & bedtime. , Disp: , Rfl:  .  levothyroxine (SYNTHROID) 137 MCG tablet, Take 137 mcg by mouth daily. , Disp: , Rfl:  .  LORazepam (ATIVAN) 2 MG tablet, Take 1 tablet by mouth daily as needed for anxiety. , Disp: , Rfl: 5 .  metoprolol succinate (TOPROL-XL) 25 MG 24 hr tablet, Take 0.5 tablets (12.5 mg total) by mouth daily., Disp: 15 tablet, Rfl: 6 .  minoxidil (ROGAINE) 2 % external solution, Apply topically 2 (two) times daily., Disp: , Rfl:  .  Multiple Vitamin (MULTIVITAMIN) capsule, Take 1 capsule by mouth daily. Taking since at least 1988., Disp: , Rfl:  .  nitroGLYCERIN (NITROSTAT) 0.4 MG SL tablet, Place 1 tablet (0.4 mg total) under the tongue every 5 (five) minutes as needed for chest pain., Disp: 25 tablet, Rfl: 3 .  NONFORMULARY OR COMPOUNDED ITEM, Place 1 suppository vaginally daily. compounded medication - vitamin E, Disp: , Rfl:  .  ondansetron (ZOFRAN) 4 MG tablet, Take 1 tablet by mouth daily as needed for nausea or vomiting. , Disp: , Rfl: 3 .  pantoprazole (PROTONIX) 40 MG tablet, Take 40 mg by mouth 2 (two) times daily. Started in 2012., Disp: , Rfl:  .  potassium chloride (K-DUR) 10 MEQ tablet, TAKE 1 TABLET BY MOUTH AS NEEDED. TAKE WITH LASIX, Disp: 30 tablet, Rfl: 11 .  prochlorperazine (COMPAZINE) 10 MG tablet, Take 1 tablet (10 mg total) every 6 (six) hours as needed by mouth (Nausea or vomiting)., Disp: 30 tablet, Rfl: 1 .  terconazole (TERAZOL 7) 0.4 % vaginal cream, Place 1 applicator vaginally 2 (two) times daily as needed (irritation). , Disp: , Rfl:  .  ticagrelor (BRILINTA) 90 MG TABS tablet, Take 1 tablet (90 mg total) by mouth 2 (two) times daily.,  Disp: 60 tablet, Rfl: 0 .  Vitamin D, Ergocalciferol, (DRISDOL) 50000 units CAPS capsule, Take 1 capsule (50,000 Units total) by mouth every 7 (seven) days., Disp: 12 capsule, Rfl: 3 .  zolpidem (AMBIEN) 10 MG tablet, Take 10 mg by mouth at bedtime as needed for sleep. , Disp: , Rfl:   Past Medical History: Past Medical History:  Diagnosis Date  . Anxiety   . Arthritis 2008  . Asthma 1953   age 65  . Breast cancer ([place])   . Cancer [facility])    breast cancer  . Chronic vaginitis 1998  . Fibromyalgia 1982  . GERD (gastroesophageal reflux disease)    since at least 2011  . Hemorrhoids 2009  . History of radiation therapy 01/18/16-03/05/16   right breast/nodal areas 45 Gy, right breast boost 16 Gy  . Hypoglycemia 2003  . Hypothyroidism 1964   age 62  . Interstitial cystitis 1998  . Irritable bowel syndrome 06/14/2009   Qualifier: Diagnosis of  By: Patsy Baltimore RN, Langley Gauss    . Menopausal symptoms   . Osteoporosis 12/24/2006  . Personal history of chemotherapy   . Personal history of radiation therapy   . Pneumonia 1979   varicella  . PONV (postoperative nausea and vomiting) 1979    Tobacco Use: Social History   Tobacco Use  Smoking Status Former Smoker  . Packs/day: 0.25  . Years: 15.00  . Pack years: 3.75  . Types: Cigarettes  Smokeless Tobacco Never Used  Tobacco Comment   social smoker/"on and off"    Labs: Recent Review Flowsheet Data    Labs for ITP Cardiac and Pulmonary Rehab Latest Ref Rng & Units 07/05/2016 11/28/2016 01/02/2017 01/03/2017   Cholestrol 0 - 200 mg/dL - - 224(H) 189   LDLCALC 0 - 99 mg/dL - - 128(H) 105(H)   HDL >40 mg/dL - - 77 67   Trlycerides <150 mg/dL - - 93 83   Hemoglobin A1c 4.8 - 5.6 % 5.2 5.2 - 4.9      Capillary Blood Glucose: No results found for: GLUCAP   Exercise Target Goals:    Exercise Program Goal: Individual exercise prescription set with THRR, safety & activity barriers. Participant demonstrates ability to understand and report  RPE using BORG scale, to self-measure pulse accurately, and to acknowledge the importance of the exercise prescription.  Exercise Prescription Goal: Starting with aerobic activity 30 plus minutes a day, 3 days per week for initial exercise prescription. Provide home exercise prescription and guidelines that participant acknowledges understanding prior to discharge.  Activity Barriers & Risk Stratification: Activity Barriers & Cardiac Risk Stratification - 03/28/17 1424      Activity Barriers & Cardiac Risk Stratification   Activity Barriers  Fibromyalgia;Deconditioning;Back Problems;Other (comment);Joint Problems    Comments  Right Hip Pain    Cardiac  Risk Stratification  Moderate       6 Minute Walk: 6 Minute Walk    Row Name 03/28/17 1438         6 Minute Walk   Phase  Initial     Distance  1710 feet     Walk Time  6 minutes     # of Rest Breaks  0     MPH  3.23     METS  3.59     RPE  12     Perceived Dyspnea   1     VO2 Peak  12.58     Symptoms  Yes (comment)     Comments  Mild SOB      Resting HR  62 bpm     Resting BP  122/70     Resting Oxygen Saturation   97 %     Exercise Oxygen Saturation  during 6 min walk  96 %     Max Ex. HR  100 bpm     Max Ex. BP  130/78     2 Minute Post BP  112/70        Oxygen Initial Assessment:   Oxygen Re-Evaluation:   Oxygen Discharge (Final Oxygen Re-Evaluation):   Initial Exercise Prescription: Initial Exercise Prescription - 03/28/17 1400      Date of Initial Exercise RX and Referring Provider   Date  03/28/17    Referring Provider  Daneen Schick MD      Recumbant Bike   Level  2    Minutes  15    METs  2      NuStep   Level  3    SPM  70    Minutes  15    METs  2.5      Track   Laps  13    Minutes  15    METs  2.51      Prescription Details   Frequency (times per week)  3    Duration  Progress to 30 minutes of continuous aerobic without signs/symptoms of physical distress      Intensity   THRR  40-80% of Max Heartrate  57-114    Ratings of Perceived Exertion  11-13    Perceived Dyspnea  0-4      Progression   Progression  Continue to progress workloads to maintain intensity without signs/symptoms of physical distress.      Resistance Training   Training Prescription  Yes    Weight  3lbs    Reps  10-15       Perform Capillary Blood Glucose checks as needed.  Exercise Prescription Changes:  Exercise Prescription Changes    Row Name 04/03/17 1343 04/15/17 1346           Response to Exercise   Blood Pressure (Admit)  110/76  126/60      Blood Pressure (Exercise)  138/70  142/64      Blood Pressure (Exit)  119/65  112/78      Heart Rate (Admit)  67 bpm  84 bpm      Heart Rate (Exercise)  90 bpm  93 bpm      Heart Rate (Exit)  67 bpm  53 bpm      Rating of Perceived Exertion (Exercise)  12  14      Symptoms  none  mild low back pain/sciatica      Comments  pt oriented to exercise equipment  -  Duration  Continue with 30 min of aerobic exercise without signs/symptoms of physical distress.  Continue with 30 min of aerobic exercise without signs/symptoms of physical distress.      Intensity  THRR unchanged  THRR unchanged        Progression   Progression  Continue to progress workloads to maintain intensity without signs/symptoms of physical distress.  Continue to progress workloads to maintain intensity without signs/symptoms of physical distress.      Average METs  2.7  2.7        Resistance Training   Training Prescription  No relaxation day  Yes      Weight  -  3lbs      Reps  -  10-15      Time  -  10 Minutes        Recumbant Bike   Level  2  2      Minutes  15  10      METs  2.7  2.5        NuStep   Level  3  3      SPM  70  70      Minutes  15  10      METs  2.7  2.6        Track   Laps  -  12      Minutes  -  10      METs  -  3.09         Exercise Comments:  Exercise Comments    Row Name 04/16/17 1356           Exercise Comments   Reviewed METs and activity levels with patient. Pt is doing well with current exercise prescription and will progress workloads based on signs of physical distress, HR, BP and RPE.          Exercise Goals and Review:  Exercise Goals    Row Name 03/28/17 1425             Exercise Goals   Increase Physical Activity  Yes       Intervention  Provide advice, education, support and counseling about physical activity/exercise needs.;Develop an individualized exercise prescription for aerobic and resistive training based on initial evaluation findings, risk stratification, comorbidities and participant's personal goals.       Expected Outcomes  Achievement of increased cardiorespiratory fitness and enhanced flexibility, muscular endurance and strength shown through measurements of functional capacity and personal statement of participant.       Increase Strength and Stamina  Yes Improve Muscular Strength, Walking Tolerance, and Motivation to Fitness       Intervention  Provide advice, education, support and counseling about physical activity/exercise needs.;Develop an individualized exercise prescription for aerobic and resistive training based on initial evaluation findings, risk stratification, comorbidities and participant's personal goals.       Expected Outcomes  Achievement of increased cardiorespiratory fitness and enhanced flexibility, muscular endurance and strength shown through measurements of functional capacity and personal statement of participant.       Able to understand and use rate of perceived exertion (RPE) scale  Yes       Intervention  Provide education and explanation on how to use RPE scale       Expected Outcomes  Short Term: Able to use RPE daily in rehab to express subjective intensity level;Long Term:  Able to use RPE to guide intensity level when exercising independently  Knowledge and understanding of Target Heart Rate Range (THRR)  Yes       Intervention  Provide  education and explanation of THRR including how the numbers were predicted and where they are located for reference       Expected Outcomes  Short Term: Able to state/look up THRR;Long Term: Able to use THRR to govern intensity when exercising independently;Short Term: Able to use daily as guideline for intensity in rehab       Able to check pulse independently  Yes       Intervention  Provide education and demonstration on how to check pulse in carotid and radial arteries.;Review the importance of being able to check your own pulse for safety during independent exercise       Expected Outcomes  Short Term: Able to explain why pulse checking is important during independent exercise;Long Term: Able to check pulse independently and accurately       Understanding of Exercise Prescription  Yes       Intervention  Provide education, explanation, and written materials on patient's individual exercise prescription       Expected Outcomes  Short Term: Able to explain program exercise prescription;Long Term: Able to explain home exercise prescription to exercise independently          Exercise Goals Re-Evaluation : Exercise Goals Re-Evaluation    [place] Name 04/16/17 1349             Exercise Goal Re-Evaluation   Exercise Goals Review  Increase Physical Activity;Able to understand and use rate of perceived exertion (RPE) scale;Knowledge and understanding of Target Heart Rate Range (THRR);Understanding of Exercise Prescription;Increase Strength and Stamina;Able to check pulse independently       Comments  Pt is exercising for 30 minutes in cardiac rehab without physical distress. Pt is making great progess and has increased walking tolerance from 7 laps to 13 laps.        Expected Outcomes  Pt will continue to improve in cardiorespiratory fitness and be able to exercise with little to no discomfort in back region           Discharge Exercise Prescription (Final Exercise Prescription Changes): Exercise  Prescription Changes - 04/15/17 1346      Response to Exercise   Blood Pressure (Admit)  126/60    Blood Pressure (Exercise)  142/64    Blood Pressure (Exit)  112/78    Heart Rate (Admit)  84 bpm    Heart Rate (Exercise)  93 bpm    Heart Rate (Exit)  53 bpm    Rating of Perceived Exertion (Exercise)  14    Symptoms  mild low back pain/sciatica    Duration  Continue with 30 min of aerobic exercise without signs/symptoms of physical distress.    Intensity  THRR unchanged      Progression   Progression  Continue to progress workloads to maintain intensity without signs/symptoms of physical distress.    Average METs  2.7      Resistance Training   Training Prescription  Yes    Weight  3lbs    Reps  10-15    Time  10 Minutes      Recumbant Bike   Level  2    Minutes  10    METs  2.5      NuStep   Level  3    SPM  70    Minutes  10    METs  2.6  Track   Laps  12    Minutes  10    METs  3.09       Nutrition:  Target Goals: Understanding of nutrition guidelines, daily intake of sodium 1500mg , cholesterol 200mg , calories 30% from fat and 7% or less from saturated fats, daily to have 5 or more servings of fruits and vegetables.  Biometrics: Pre Biometrics - 03/28/17 1437      Pre Biometrics   Height  5' 3.5" (1.613 m)    Weight  139 lb 12.4 oz (63.4 kg)    Waist Circumference  32 inches    Hip Circumference  40 inches    Waist to Hip Ratio  0.8 %    BMI (Calculated)  24.37    Triceps Skinfold  30 mm    % Body Fat  36.9 %    Grip Strength  26 kg    Flexibility  0 in    Single Leg Stand  1.21 seconds        Nutrition Therapy Plan and Nutrition Goals: Nutrition Therapy & Goals - 03/28/17 1535      Nutrition Therapy   Diet  Heart Healthy      Personal Nutrition Goals   Nutrition Goal  Pt to identify food quantities necessary to achieve weight loss of 5-8 lb at graduation from cardiac rehab. Goal wt of 130 lb desired.       Intervention Plan    Intervention  Prescribe, educate and counsel regarding individualized specific dietary modifications aiming towards targeted core components such as weight, hypertension, lipid management, diabetes, heart failure and other comorbidities.    Expected Outcomes  Short Term Goal: Understand basic principles of dietary content, such as calories, fat, sodium, cholesterol and nutrients.;Long Term Goal: Adherence to prescribed nutrition plan.       Nutrition Discharge: Nutrition Scores: Nutrition Assessments - 03/29/17 0837      MEDFICTS Scores   Pre Score  27       Nutrition Goals Re-Evaluation:   Nutrition Goals Re-Evaluation:   Nutrition Goals Discharge (Final Nutrition Goals Re-Evaluation):   Psychosocial: Target Goals: Acknowledge presence or absence of significant depression and/or stress, maximize coping skills, provide positive support system. Participant is able to verbalize types and ability to use techniques and skills needed for reducing stress and depression.  Initial Review & Psychosocial Screening: Initial Psych Review & Screening - 03/28/17 1704      Initial Review   Current issues with  Current Stress Concerns;None Identified    Source of Stress Concerns  Chronic Illness History of breast cancer      Family Dynamics   Good Support System?  Yes      Barriers   Psychosocial barriers to participate in program  There are no identifiable barriers or psychosocial needs.;The patient should benefit from training in stress management and relaxation.      Screening Interventions   Interventions  To provide support and resources with identified psychosocial needs;Provide feedback about the scores to participant       Quality of Life Scores: Quality of Life - 03/28/17 1632      Quality of Life Scores   Health/Function Pre  28 %    Socioeconomic Pre  30 %    Psych/Spiritual Pre  29.14 %    Family Pre  30 %    GLOBAL Pre  28.94 %       PHQ-9: Recent Review Flowsheet  Data    Depression screen [facility] 2/9 04/03/2017  10/15/2016 04/05/2016 12/21/2015   Decreased Interest 0 0 0 0   Down, Depressed, Hopeless 0 0 0 -   PHQ - 2 Score 0 0 0 0     Interpretation of Total Score  Total Score Depression Severity:  1-4 = Minimal depression, 5-9 = Mild depression, 10-14 = Moderate depression, 15-19 = Moderately severe depression, 20-27 = Severe depression   Psychosocial Evaluation and Intervention:   Psychosocial Re-Evaluation: Psychosocial Re-Evaluation    [place] Name 04/25/17 1610             Psychosocial Re-Evaluation   Current issues with  Current Stress Concerns       Interventions  Encouraged to attend Cardiac Rehabilitation for the exercise;Stress management education       Continue Psychosocial Services   Follow up required by staff       Comments  Benna is a breast cancer survivor and has good family support. Will continue to offer emotional support as needed         Initial Review   Source of Stress Concerns  Chronic Illness          Psychosocial Discharge (Final Psychosocial Re-Evaluation): Psychosocial Re-Evaluation - 04/25/17 1610      Psychosocial Re-Evaluation   Current issues with  Current Stress Concerns    Interventions  Encouraged to attend Cardiac Rehabilitation for the exercise;Stress management education    Continue Psychosocial Services   Follow up required by staff    Comments  Ashayla is a breast cancer survivor and has good family support. Will continue to offer emotional support as needed      Initial Review   Source of Stress Concerns  Chronic Illness       Vocational Rehabilitation: Provide vocational rehab assistance to qualifying candidates.   Vocational Rehab Evaluation & Intervention: Vocational Rehab - 03/28/17 1703      Initial Vocational Rehab Evaluation & Intervention   Assessment shows need for Vocational Rehabilitation  No Jobina does not need vocational rehab for job retriaining.       Education: Education  Goals: Education classes will be provided on a weekly basis, covering required topics. Participant will state understanding/return demonstration of topics presented.  Learning Barriers/Preferences: Learning Barriers/Preferences - 03/28/17 1422      Learning Barriers/Preferences   Learning Barriers  [facility] Cataract [facility] and Readers    Learning Preferences  Audio;Written Material;Video;Verbal Instruction;Skilled Demonstration;Individual Instruction;Pictoral;Computer/Internet;Group Instruction       Education Topics: Count Your Pulse:  -Group instruction provided by verbal instruction, demonstration, patient participation and written materials to support subject.  Instructors address importance of being able to find your pulse and how to count your pulse when at home without a heart monitor.  Patients get hands on experience counting their pulse with staff help and individually.   Heart Attack, Angina, and Risk Factor Modification:  -Group instruction provided by verbal instruction, video, and written materials to support subject.  Instructors address signs and symptoms of angina and heart attacks.    Also discuss risk factors for heart disease and how to make changes to improve heart health risk factors.   Functional Fitness:  -Group instruction provided by verbal instruction, demonstration, patient participation, and written materials to support subject.  Instructors address safety measures for doing things around the house.  Discuss how to get up and down off the floor, how to pick things up properly, how to safely get out of a chair without assistance, and balance training.   Meditation and Mindfulness:  -  Group instruction provided by verbal instruction, patient participation, and written materials to support subject.  Instructor addresses importance of mindfulness and meditation practice to help reduce stress and improve awareness.  Instructor also leads participants through a meditation  exercise.    CARDIAC REHAB PHASE II EXERCISE from 04/12/2017 in [place]  Date  04/03/17  Instruction Review Code  2- meets goals/outcomes      Stretching for Flexibility and Mobility:  -Group instruction provided by verbal instruction, patient participation, and written materials to support subject.  Instructors lead participants through series of stretches that are designed to increase flexibility thus improving mobility.  These stretches are additional exercise for major muscle groups that are typically performed during regular warm up and cool down.   Hands Only CPR:  -Group verbal, video, and participation provides a basic overview of AHA guidelines for community CPR. Role-play of emergencies allow participants the opportunity to practice calling for help and chest compression technique with discussion of AED use.   Hypertension: -Group verbal and written instruction that provides a basic overview of hypertension including the most recent diagnostic guidelines, risk factor reduction with self-care instructions and medication management.   CARDIAC REHAB PHASE II EXERCISE from 04/12/2017 in [place]  Date  04/05/17  Instruction Review Code  2- meets goals/outcomes       Nutrition I class: Heart Healthy Eating:  -Group instruction provided by PowerPoint slides, verbal discussion, and written materials to support subject matter. The instructor gives an explanation and review of the Therapeutic Lifestyle Changes diet recommendations, which includes a discussion on lipid goals, dietary fat, sodium, fiber, plant stanol/sterol esters, sugar, and the components of a well-balanced, healthy diet.   Nutrition II class: Lifestyle Skills:  -Group instruction provided by PowerPoint slides, verbal discussion, and written materials to support subject matter. The instructor gives an explanation and review of label reading, grocery  shopping for heart health, heart healthy recipe modifications, and ways to make healthier choices when eating out.   CARDIAC REHAB PHASE II EXERCISE from 04/12/2017 in [place]  Date  03/28/17 [place] handouts given]  Educator  RD  Instruction Review Code  Not applicable      Diabetes Question & Answer:  -Group instruction provided by PowerPoint slides, verbal discussion, and written materials to support subject matter. The instructor gives an explanation and review of diabetes co-morbidities, pre- and post-prandial blood glucose goals, pre-exercise blood glucose goals, signs, symptoms, and treatment of hypoglycemia and hyperglycemia, and foot care basics.   CARDIAC REHAB PHASE II EXERCISE from 04/12/2017 in [place]  Date  04/12/17  Instruction Review Code  2- meets goals/outcomes      Diabetes Blitz:  -Group instruction provided by PowerPoint slides, verbal discussion, and written materials to support subject matter. The instructor gives an explanation and review of the physiology behind type 1 and type 2 diabetes, diabetes medications and rational behind using different medications, pre- and post-prandial blood glucose recommendations and Hemoglobin A1c goals, diabetes diet, and exercise including blood glucose guidelines for exercising safely.    Portion Distortion:  -Group instruction provided by PowerPoint slides, verbal discussion, written materials, and food models to support subject matter. The instructor gives an explanation of serving size versus portion size, changes in portions sizes over the last 20 years, and what consists of a serving from each food group.   Stress Management:  -Group instruction provided by verbal  instruction, video, and written materials to support subject matter.  Instructors review role of stress in heart disease and how to cope with stress positively.     Exercising on Your Own:   -Group instruction provided by verbal instruction, power point, and written materials to support subject.  Instructors discuss benefits of exercise, components of exercise, frequency and intensity of exercise, and end points for exercise.  Also discuss use of nitroglycerin and activating EMS.  Review options of places to exercise outside of rehab.  Review guidelines for sex with heart disease.   Cardiac Drugs I:  -Group instruction provided by verbal instruction and written materials to support subject.  Instructor reviews cardiac drug classes: antiplatelets, anticoagulants, beta blockers, and statins.  Instructor discusses reasons, side effects, and lifestyle considerations for each drug class.   Cardiac Drugs II:  -Group instruction provided by verbal instruction and written materials to support subject.  Instructor reviews cardiac drug classes: angiotensin converting enzyme inhibitors (ACE-I), angiotensin II receptor blockers (ARBs), nitrates, and calcium channel blockers.  Instructor discusses reasons, side effects, and lifestyle considerations for each drug class.   CARDIAC REHAB PHASE II EXERCISE from 04/12/2017 in [place]  Instruction Review Code  2- meets goals/outcomes      Anatomy and Physiology of the Circulatory System:  Group verbal and written instruction and models provide basic cardiac anatomy and physiology, with the coronary electrical and arterial systems. Review of: AMI, Angina, Valve disease, Heart Failure, Peripheral Artery Disease, Cardiac Arrhythmia, Pacemakers, and the ICD.   Other Education:  -Group or individual verbal, written, or video instructions that support the educational goals of the cardiac rehab program.   Knowledge Questionnaire Score: Knowledge Questionnaire Score - 03/28/17 1626      Knowledge Questionnaire Score   Pre Score  23/24       Core Components/Risk Factors/Patient Goals at Admission: Personal Goals  and Risk Factors at Admission - 03/28/17 1709      Core Components/Risk Factors/Patient Goals on Admission    Weight Management  Yes;Weight Maintenance    Intervention  Weight Management: Develop a combined nutrition and exercise program designed to reach desired caloric intake, while maintaining appropriate intake of nutrient and fiber, sodium and fats, and appropriate energy expenditure required for the weight goal.;Weight Management: Provide education and appropriate resources to help participant work on and attain dietary goals.;Weight Management/Obesity: Establish reasonable short term and long term weight goals.    Admit Weight  139 lb 12.4 oz (63.4 kg)    Goal Weight: Short Term  139 lb (63 kg)    Goal Weight: Long Term  139 lb (63 kg)    Expected Outcomes  Short Term: Continue to assess and modify interventions until short term weight is achieved;Long Term: Adherence to nutrition and physical activity/exercise program aimed toward attainment of established weight goal;Weight Maintenance: Understanding of the daily nutrition guidelines, which includes 25-35% calories from fat, 7% or less cal from saturated fats, less than 200mg  cholesterol, less than 1.5gm of sodium, & 5 or more servings of fruits and vegetables daily    Hypertension  Yes    Intervention  Provide education on lifestyle modifcations including regular physical activity/exercise, weight management, moderate sodium restriction and increased consumption of fresh fruit, vegetables, and low fat dairy, alcohol moderation, and smoking cessation.;Monitor prescription use compliance.    Expected Outcomes  Short Term: Continued assessment and intervention until BP is < 140/39mm HG in hypertensive participants. < 130/55mm HG in hypertensive participants  with diabetes, heart failure or chronic kidney disease.;Long Term: Maintenance of blood pressure at goal levels.    Lipids  Yes    Intervention  Provide education and support for participant  on nutrition & aerobic/resistive exercise along with prescribed medications to achieve LDL 70mg , HDL >40mg .    Expected Outcomes  Short Term: Participant states understanding of desired cholesterol values and is compliant with medications prescribed. Participant is following exercise prescription and nutrition guidelines.;Long Term: Cholesterol controlled with medications as prescribed, with individualized exercise RX and with personalized nutrition plan. Value goals: LDL < 70mg , HDL > 40 mg.    Stress  Yes    Intervention  Offer individual and/or small group education and counseling on adjustment to heart disease, stress management and health-related lifestyle change. Teach and support self-help strategies.;Refer participants experiencing significant psychosocial distress to appropriate mental health specialists for further evaluation and treatment. When possible, include family members and significant others in education/counseling sessions.    Expected Outcomes  Short Term: Participant demonstrates changes in health-related behavior, relaxation and other stress management skills, ability to obtain effective social support, and compliance with psychotropic medications if prescribed.;Long Term: Emotional wellbeing is indicated by absence of clinically significant psychosocial distress or social isolation.       Core Components/Risk Factors/Patient Goals Review:  Goals and Risk Factor Review    Row Name 04/25/17 1608             Core Components/Risk Factors/Patient Goals Review   Personal Goals Review  Weight Management/Obesity;Hypertension       Review  Shelbia's vital signs have been stable at cardiac rehab. Talayia has maintained her weight.       Expected Outcomes  Tameya will continue to partcipate in phase 2 cardiac rehab and take her medications as presribed.          Core Components/Risk Factors/Patient Goals at Discharge (Final Review):  Goals and Risk Factor Review - 04/25/17 1608       Core Components/Risk Factors/Patient Goals Review   Personal Goals Review  Weight Management/Obesity;Hypertension    Review  Keelie's vital signs have been stable at cardiac rehab. Selicia has maintained her weight.    Expected Outcomes  Symphany will continue to partcipate in phase 2 cardiac rehab and take her medications as presribed.       ITP Comments: ITP Comments    Row Name 03/28/17 1305 04/25/17 1606         ITP Comments  Dr Fransico Him, Medical Director  30 Day ITP Review. Verbena has good atttendance and participation in phase 2 cardiac rehab. Porsche will be out during the Holiday season to visit with family out of town         Comments: See ITP comments.Barnet Pall, RN,BSN 04/25/2017 4:13 PM

## 2017-04-26 ENCOUNTER — Encounter (HOSPITAL_COMMUNITY)
Admission: RE | Admit: 2017-04-26 | Discharge: 2017-04-26 | Disposition: A | Discharge status: Home / Self Care | Payer: Medicare Other | Source: Ambulatory Visit | Attending: Interventional Cardiology | Admitting: Interventional Cardiology

## 2017-04-26 ENCOUNTER — Encounter (HOSPITAL_COMMUNITY): Payer: Medicare Other

## 2017-04-26 DIAGNOSIS — I214 Non-ST elevation (NSTEMI) myocardial infarction: Secondary | ICD-10-CM

## 2017-04-26 DIAGNOSIS — Z955 Presence of coronary angioplasty implant and graft: Secondary | ICD-10-CM

## 2017-04-26 NOTE — Progress Notes (Signed)
I have reviewed a Home Exercise Prescription with [name] . [name] is currently walking 20 minutes 2-3 days at home.  The patient was advised to walk 2-3 days a week for 30-45 minutes.  [name] and I discussed how to progress their exercise prescription. The patient stated that they understand the exercise prescription.  We reviewed exercise guidelines, target heart rate during exercise, weather, endpoints for exercise, and goals.  Patient is encouraged to come to me with any questions. I will continue to follow up with the patient to assist them with progression and safety.    Carma Lair MS, ACSM CEP 04/26/2017 4:26 PM

## 2017-04-29 ENCOUNTER — Encounter (HOSPITAL_COMMUNITY): Payer: Medicare Other

## 2017-05-01 ENCOUNTER — Encounter (HOSPITAL_COMMUNITY): Payer: Medicare Other

## 2017-05-03 ENCOUNTER — Encounter (HOSPITAL_COMMUNITY): Payer: Medicare Other

## 2017-05-06 ENCOUNTER — Encounter (HOSPITAL_COMMUNITY): Payer: Medicare Other

## 2017-05-08 ENCOUNTER — Encounter (HOSPITAL_COMMUNITY): Payer: Medicare Other

## 2017-05-10 ENCOUNTER — Encounter (HOSPITAL_COMMUNITY): Payer: Medicare Other

## 2017-05-13 ENCOUNTER — Encounter (HOSPITAL_COMMUNITY): Payer: Medicare Other

## 2017-05-15 ENCOUNTER — Encounter (HOSPITAL_COMMUNITY): Payer: Medicare Other

## 2017-05-17 ENCOUNTER — Encounter (HOSPITAL_COMMUNITY): Payer: Medicare Other

## 2017-05-20 ENCOUNTER — Encounter (HOSPITAL_COMMUNITY)
Admission: RE | Admit: 2017-05-20 | Discharge: 2017-05-20 | Disposition: A | Discharge status: Home / Self Care | Payer: Medicare Other | Source: Ambulatory Visit | Attending: Interventional Cardiology | Admitting: Interventional Cardiology

## 2017-05-20 ENCOUNTER — Encounter (HOSPITAL_COMMUNITY): Payer: Medicare Other

## 2017-05-20 DIAGNOSIS — I214 Non-ST elevation (NSTEMI) myocardial infarction: Secondary | ICD-10-CM | POA: Diagnosis present

## 2017-05-20 DIAGNOSIS — Z955 Presence of coronary angioplasty implant and graft: Secondary | ICD-10-CM | POA: Insufficient documentation

## 2017-05-21 NOTE — Progress Notes (Signed)
Cardiac Individual Treatment Plan  Patient Details  Name: KAYDEN AMEND MRN: [id number] Date of Birth: 02/26/1946 Referring Provider:     [place] from 03/28/2017 in [facility]  Referring Provider  Daneen Schick MD      Initial Encounter Date:    CARDIAC REHAB PHASE II ORIENTATION from 03/28/2017 in [facility]  Date  03/28/17  Referring Provider  Daneen Schick MD      Visit Diagnosis: NSTEMI (non-ST elevated myocardial infarction) ([place]) 12/31/16  S/P coronary artery stent placement 01/01/17  Patient's Home Medications on Admission:  Current Outpatient Medications:  .  albuterol (PROVENTIL HFA;VENTOLIN HFA) 108 (90 Base) MCG/ACT inhaler, Inhale 1-2 puffs into the lungs every 6 (six) hours as needed for wheezing or shortness of breath. Started in 1998, Disp: , Rfl:  .  anastrozole (ARIMIDEX) 1 MG tablet, Take 1 tablet (1 mg total) by mouth daily., Disp: 90 tablet, Rfl: 4 .  aspirin EC 81 MG EC tablet, Take 1 tablet (81 mg total) by mouth daily., Disp: 30 tablet, Rfl: 0 .  buPROPion (WELLBUTRIN XL) 300 MG 24 hr tablet, Take 300 mg by mouth daily. , Disp: , Rfl:  .  Calcium Carbonate-Vitamin D (CALCIUM + D PO), Take 1 tablet by mouth at bedtime. Started approximately 1998, Disp: , Rfl:  .  carisoprodol (SOMA) 350 MG tablet, Take 350 mg by mouth 2 (two) times daily as needed for muscle spasms. Started in 2008., Disp: , Rfl: 2 .  diazepam (VALIUM) 5 MG tablet, Take 5 mg by mouth every 6 (six) hours as needed for muscle spasms (interstitial cystitis). Started approximately 2008., Disp: , Rfl:  .  ezetimibe (ZETIA) 10 MG tablet, Take 1 tablet (10 mg total) by mouth daily., Disp: 30 tablet, Rfl: 11 .  fluconazole (DIFLUCAN) 100 MG tablet, Take 1 tablet (100 mg total) by mouth daily., Disp: 30 tablet, Rfl: 3 .  furosemide (LASIX) 20 MG tablet, Take 1 tablet (20 mg total) by mouth daily as needed (shortness of  breath)., Disp: 30 tablet, Rfl: 3 .  HYDROcodone-acetaminophen (NORCO) 10-325 MG tablet, Take 1 tablet by mouth every 4 (four) hours as needed for moderate pain or severe pain (as needed for fibromyalgia and IC). , Disp: , Rfl:  .  hydrOXYzine (VISTARIL) 25 MG capsule, Take 25 mg by mouth 3 times/day as needed-between meals & bedtime. , Disp: , Rfl:  .  levothyroxine (SYNTHROID) 137 MCG tablet, Take 137 mcg by mouth daily. , Disp: , Rfl:  .  LORazepam (ATIVAN) 2 MG tablet, Take 1 tablet by mouth daily as needed for anxiety. , Disp: , Rfl: 5 .  metoprolol succinate (TOPROL-XL) 25 MG 24 hr tablet, Take 0.5 tablets (12.5 mg total) by mouth daily., Disp: 15 tablet, Rfl: 6 .  minoxidil (ROGAINE) 2 % external solution, Apply topically 2 (two) times daily., Disp: , Rfl:  .  Multiple Vitamin (MULTIVITAMIN) capsule, Take 1 capsule by mouth daily. Taking since at least 1988., Disp: , Rfl:  .  nitroGLYCERIN (NITROSTAT) 0.4 MG SL tablet, Place 1 tablet (0.4 mg total) under the tongue every 5 (five) minutes as needed for chest pain., Disp: 25 tablet, Rfl: 3 .  NONFORMULARY OR COMPOUNDED ITEM, Place 1 suppository vaginally daily. compounded medication - vitamin E, Disp: , Rfl:  .  ondansetron (ZOFRAN) 4 MG tablet, Take 1 tablet by mouth daily as needed for nausea or vomiting. , Disp: , Rfl: 3 .  pantoprazole (PROTONIX) 40 MG tablet, Take 40 mg by mouth 2 (two) times daily. Started in 2012., Disp: , Rfl:  .  potassium chloride (K-DUR) 10 MEQ tablet, TAKE 1 TABLET BY MOUTH AS NEEDED. TAKE WITH LASIX, Disp: 30 tablet, Rfl: 11 .  prochlorperazine (COMPAZINE) 10 MG tablet, Take 1 tablet (10 mg total) every 6 (six) hours as needed by mouth (Nausea or vomiting)., Disp: 30 tablet, Rfl: 1 .  terconazole (TERAZOL 7) 0.4 % vaginal cream, Place 1 applicator vaginally 2 (two) times daily as needed (irritation). , Disp: , Rfl:  .  ticagrelor (BRILINTA) 90 MG TABS tablet, Take 1 tablet (90 mg total) by mouth 2 (two) times daily.,  Disp: 60 tablet, Rfl: 0 .  Vitamin D, Ergocalciferol, (DRISDOL) 50000 units CAPS capsule, Take 1 capsule (50,000 Units total) by mouth every 7 (seven) days., Disp: 12 capsule, Rfl: 3 .  zolpidem (AMBIEN) 10 MG tablet, Take 10 mg by mouth at bedtime as needed for sleep. , Disp: , Rfl:   Past Medical History: Past Medical History:  Diagnosis Date  . Anxiety   . Arthritis 2008  . Asthma 1953   age 40  . Breast cancer ([place])   . Cancer [facility])    breast cancer  . Chronic vaginitis 1998  . Fibromyalgia 1982  . GERD (gastroesophageal reflux disease)    since at least 2011  . Hemorrhoids 2009  . History of radiation therapy 01/18/16-03/05/16   right breast/nodal areas 45 Gy, right breast boost 16 Gy  . Hypoglycemia 2003  . Hypothyroidism 1964   age 37  . Interstitial cystitis 1998  . Irritable bowel syndrome 06/14/2009   Qualifier: Diagnosis of  By: Patsy Baltimore RN, Langley Gauss    . Menopausal symptoms   . Osteoporosis 12/24/2006  . Personal history of chemotherapy   . Personal history of radiation therapy   . Pneumonia 1979   varicella  . PONV (postoperative nausea and vomiting) 1979    Tobacco Use: Social History   Tobacco Use  Smoking Status Former Smoker  . Packs/day: 0.25  . Years: 15.00  . Pack years: 3.75  . Types: Cigarettes  Smokeless Tobacco Never Used  Tobacco Comment   social smoker/"on and off"    Labs: Recent Review Flowsheet Data    Labs for ITP Cardiac and Pulmonary Rehab Latest Ref Rng & Units 07/05/2016 11/28/2016 01/02/2017 01/03/2017   Cholestrol 0 - 200 mg/dL - - 224(H) 189   LDLCALC 0 - 99 mg/dL - - 128(H) 105(H)   HDL >40 mg/dL - - 77 67   Trlycerides <150 mg/dL - - 93 83   Hemoglobin A1c 4.8 - 5.6 % 5.2 5.2 - 4.9      Capillary Blood Glucose: No results found for: GLUCAP   Exercise Target Goals:    Exercise Program Goal: Individual exercise prescription set using results from initial 6 min walk test and THRR while considering  patient's activity barriers  and safety.   Exercise Prescription Goal: Initial exercise prescription builds to 30-45 minutes a day of aerobic activity, 2-3 days per week.  Home exercise guidelines will be given to patient during program as part of exercise prescription that the participant will acknowledge.  Activity Barriers & Risk Stratification: Activity Barriers & Cardiac Risk Stratification - 03/28/17 1424      Activity Barriers & Cardiac Risk Stratification   Activity Barriers  Fibromyalgia;Deconditioning;Back Problems;Other (comment);Joint Problems    Comments  Right Hip Pain    Cardiac Risk Stratification  Moderate  6 Minute Walk: 6 Minute Walk    Row Name 03/28/17 1438         6 Minute Walk   Phase  Initial     Distance  1710 feet     Walk Time  6 minutes     # of Rest Breaks  0     MPH  3.23     METS  3.59     RPE  12     Perceived Dyspnea   1     VO2 Peak  12.58     Symptoms  Yes (comment)     Comments  Mild SOB      Resting HR  62 bpm     Resting BP  122/70     Resting Oxygen Saturation   97 %     Exercise Oxygen Saturation  during 6 min walk  96 %     Max Ex. HR  100 bpm     Max Ex. BP  130/78     2 Minute Post BP  112/70        Oxygen Initial Assessment:   Oxygen Re-Evaluation:   Oxygen Discharge (Final Oxygen Re-Evaluation):   Initial Exercise Prescription: Initial Exercise Prescription - 03/28/17 1400      Date of Initial Exercise RX and Referring Provider   Date  03/28/17    Referring Provider  Daneen Schick MD      Recumbant Bike   Level  2    Minutes  15    METs  2      NuStep   Level  3    SPM  70    Minutes  15    METs  2.5      Track   Laps  13    Minutes  15    METs  2.51      Prescription Details   Frequency (times per week)  3    Duration  Progress to 30 minutes of continuous aerobic without signs/symptoms of physical distress      Intensity   THRR 40-80% of Max Heartrate  57-114    Ratings of Perceived Exertion  11-13    Perceived  Dyspnea  0-4      Progression   Progression  Continue to progress workloads to maintain intensity without signs/symptoms of physical distress.      Resistance Training   Training Prescription  Yes    Weight  3lbs    Reps  10-15       Perform Capillary Blood Glucose checks as needed.  Exercise Prescription Changes:  Exercise Prescription Changes    Row Name 04/03/17 1343 04/15/17 1346 05/20/17 1505         Response to Exercise   Blood Pressure (Admit)  110/76  126/60  122/64     Blood Pressure (Exercise)  138/70  142/64  136/78     Blood Pressure (Exit)  119/65  112/78  122/70     Heart Rate (Admit)  67 bpm  84 bpm  65 bpm     Heart Rate (Exercise)  90 bpm  93 bpm  89 bpm     Heart Rate (Exit)  67 bpm  53 bpm  77 bpm     Rating of Perceived Exertion (Exercise)  12  14  14      Symptoms  none  mild low back pain/sciatica  -     Comments  pt oriented to exercise equipment  -  -  Duration  Continue with 30 min of aerobic exercise without signs/symptoms of physical distress.  Continue with 30 min of aerobic exercise without signs/symptoms of physical distress.  Continue with 30 min of aerobic exercise without signs/symptoms of physical distress.     Intensity  THRR unchanged  THRR unchanged  THRR unchanged       Progression   Progression  Continue to progress workloads to maintain intensity without signs/symptoms of physical distress.  Continue to progress workloads to maintain intensity without signs/symptoms of physical distress.  Continue to progress workloads to maintain intensity without signs/symptoms of physical distress.     Average METs  2.7  2.7  2.9       Resistance Training   Training Prescription  No relaxation day  Yes  Yes     Weight  -  3lbs  3lbs     Reps  -  10-15  10-15     Time  -  10 Minutes  10 Minutes       Interval Training   Interval Training  -  -  No       Recumbant Bike   Level  2  2  2      Minutes  15  10  10      METs  2.7  2.5  2.4        NuStep   Level  3  3  3      SPM  70  70  70     Minutes  15  10  10      METs  2.7  2.6  3.1       Track   Laps  -  12  12     Minutes  -  10  10     METs  -  3.09  3.09        Exercise Comments:  Exercise Comments    Row Name 04/16/17 1356 04/26/17 1627 05/17/17 1512 05/20/17 1514     Exercise Comments  Reviewed METs and activity levels with patient. Pt is doing well with current exercise prescription and will progress workloads based on signs of physical distress, HR, BP and RPE.  Reviewed Home Exercise Program with patient. Patient will continue to walk 2-3 days a week at home; 30-45 minutes. Will continue to monitor patient.   Pt has returned to cardiac rehab from family trip. Pt has responded to exercise prescription well today, despite being gone for several weeks. Will continue to monitor and progress pt.   Pt has returned to cardiac rehab from family trip. Pt has responded to exercise prescription well today, despite being gone for several weeks. Will continue to monitor and progress pt.        Exercise Goals and Review:  Exercise Goals    Row Name 03/28/17 1425             Exercise Goals   Increase Physical Activity  Yes       Intervention  Provide advice, education, support and counseling about physical activity/exercise needs.;Develop an individualized exercise prescription for aerobic and resistive training based on initial evaluation findings, risk stratification, comorbidities and participant's personal goals.       Expected Outcomes  Achievement of increased cardiorespiratory fitness and enhanced flexibility, muscular endurance and strength shown through measurements of functional capacity and personal statement of participant.       Increase Strength and Stamina  Yes Improve Muscular Strength, Walking Tolerance, and Motivation to Fitness  Intervention  Provide advice, education, support and counseling about physical activity/exercise needs.;Develop an  individualized exercise prescription for aerobic and resistive training based on initial evaluation findings, risk stratification, comorbidities and participant's personal goals.       Expected Outcomes  Achievement of increased cardiorespiratory fitness and enhanced flexibility, muscular endurance and strength shown through measurements of functional capacity and personal statement of participant.       Able to understand and use rate of perceived exertion (RPE) scale  Yes       Intervention  Provide education and explanation on how to use RPE scale       Expected Outcomes  Short Term: Able to use RPE daily in rehab to express subjective intensity level;Long Term:  Able to use RPE to guide intensity level when exercising independently       Knowledge and understanding of Target Heart Rate Range (THRR)  Yes       Intervention  Provide education and explanation of THRR including how the numbers were predicted and where they are located for reference       Expected Outcomes  Short Term: Able to state/look up THRR;Long Term: Able to use THRR to govern intensity when exercising independently;Short Term: Able to use daily as guideline for intensity in rehab       Able to check pulse independently  Yes       Intervention  Provide education and demonstration on how to check pulse in carotid and radial arteries.;Review the importance of being able to check your own pulse for safety during independent exercise       Expected Outcomes  Short Term: Able to explain why pulse checking is important during independent exercise;Long Term: Able to check pulse independently and accurately       Understanding of Exercise Prescription  Yes       Intervention  Provide education, explanation, and written materials on patient's individual exercise prescription       Expected Outcomes  Short Term: Able to explain program exercise prescription;Long Term: Able to explain home exercise prescription to exercise independently           Exercise Goals Re-Evaluation : Exercise Goals Re-Evaluation    Row Name 04/16/17 1349 05/22/17 1516 05/22/17 1518 05/22/17 1519       Exercise Goal Re-Evaluation   Exercise Goals Review  Increase Physical Activity;Able to understand and use rate of perceived exertion (RPE) scale;Knowledge and understanding of Target Heart Rate Range (THRR);Understanding of Exercise Prescription;Increase Strength and Stamina;Able to check pulse independently  Increase Physical Activity;Able to understand and use rate of perceived exertion (RPE) scale;Knowledge and understanding of Target Heart Rate Range (THRR);Understanding of Exercise Prescription;Increase Strength and Stamina;Able to check pulse independently  Increase Physical Activity;Able to understand and use rate of perceived exertion (RPE) scale;Knowledge and understanding of Target Heart Rate Range (THRR);Understanding of Exercise Prescription;Increase Strength and Stamina;Able to check pulse independently  Increase Physical Activity;Able to understand and use rate of perceived exertion (RPE) scale;Knowledge and understanding of Target Heart Rate Range (THRR);Understanding of Exercise Prescription;Increase Strength and Stamina;Able to check pulse independently    Comments  Pt is exercising for 30 minutes in cardiac rehab without physical distress. Pt is making great progess and has increased walking tolerance from 7 laps to 13 laps.   -  Pt is   Pt is tolerating exercise prescription well. Will continue to monitor and progres patient.     Expected Outcomes  Pt will continue to improve in cardiorespiratory fitness  and be able to exercise with little to no discomfort in back region  Pt will get back to regular exercise routine. Pt will continue to improve cardiovascualr fitness.   Pt will get back to regular exercise routine. Pt will continue to improve cardiovascualr fitness.   Pt will get back to regular exercise routine. Pt will continue to improve  cardiovascualr fitness.         Discharge Exercise Prescription (Final Exercise Prescription Changes): Exercise Prescription Changes - 05/20/17 1505      Response to Exercise   Blood Pressure (Admit)  122/64    Blood Pressure (Exercise)  136/78    Blood Pressure (Exit)  122/70    Heart Rate (Admit)  65 bpm    Heart Rate (Exercise)  89 bpm    Heart Rate (Exit)  77 bpm    Rating of Perceived Exertion (Exercise)  14    Duration  Continue with 30 min of aerobic exercise without signs/symptoms of physical distress.    Intensity  THRR unchanged      Progression   Progression  Continue to progress workloads to maintain intensity without signs/symptoms of physical distress.    Average METs  2.9      Resistance Training   Training Prescription  Yes    Weight  3lbs    Reps  10-15    Time  10 Minutes      Interval Training   Interval Training  No      Recumbant Bike   Level  2    Minutes  10    METs  2.4      NuStep   Level  3    SPM  70    Minutes  10    METs  3.1      Track   Laps  12    Minutes  10    METs  3.09       Nutrition:  Target Goals: Understanding of nutrition guidelines, daily intake of sodium 1500mg , cholesterol 200mg , calories 30% from fat and 7% or less from saturated fats, daily to have 5 or more servings of fruits and vegetables.  Biometrics: Pre Biometrics - 03/28/17 1437      Pre Biometrics   Height  5' 3.5" (1.613 m)    Weight  139 lb 12.4 oz (63.4 kg)    Waist Circumference  32 inches    Hip Circumference  40 inches    Waist to Hip Ratio  0.8 %    BMI (Calculated)  24.37    Triceps Skinfold  30 mm    % Body Fat  36.9 %    Grip Strength  26 kg    Flexibility  0 in    Single Leg Stand  1.21 seconds        Nutrition Therapy Plan and Nutrition Goals: Nutrition Therapy & Goals - 03/28/17 1535      Nutrition Therapy   Diet  Heart Healthy      Personal Nutrition Goals   Nutrition Goal  Pt to identify food quantities necessary to  achieve weight loss of 5-8 lb at graduation from cardiac rehab. Goal wt of 130 lb desired.       Intervention Plan   Intervention  Prescribe, educate and counsel regarding individualized specific dietary modifications aiming towards targeted core components such as weight, hypertension, lipid management, diabetes, heart failure and other comorbidities.    Expected Outcomes  Short Term Goal: Understand basic principles of dietary  content, such as calories, fat, sodium, cholesterol and nutrients.;Long Term Goal: Adherence to prescribed nutrition plan.       Nutrition Assessments: Nutrition Assessments - 03/29/17 0837      MEDFICTS Scores   Pre Score  27       Nutrition Goals Re-Evaluation:   Nutrition Goals Re-Evaluation:   Nutrition Goals Discharge (Final Nutrition Goals Re-Evaluation):   Psychosocial: Target Goals: Acknowledge presence or absence of significant depression and/or stress, maximize coping skills, provide positive support system. Participant is able to verbalize types and ability to use techniques and skills needed for reducing stress and depression.  Initial Review & Psychosocial Screening: Initial Psych Review & Screening - 03/28/17 1704      Initial Review   Current issues with  Current Stress Concerns;None Identified    Source of Stress Concerns  Chronic Illness History of breast cancer      Family Dynamics   Good Support System?  Yes      Barriers   Psychosocial barriers to participate in program  There are no identifiable barriers or psychosocial needs.;The patient should benefit from training in stress management and relaxation.      Screening Interventions   Interventions  To provide support and resources with identified psychosocial needs;Provide feedback about the scores to participant       Quality of Life Scores: Quality of Life - 03/28/17 1632      Quality of Life Scores   Health/Function Pre  28 %    Socioeconomic Pre  30 %     Psych/Spiritual Pre  29.14 %    Family Pre  30 %    GLOBAL Pre  28.94 %      Scores of 19 and below usually indicate a poorer quality of life in these areas.  A difference of  2-3 points is a clinically meaningful difference.  A difference of 2-3 points in the total score of the Quality of Life Index has been associated with significant improvement in overall quality of life, self-image, physical symptoms, and general health in studies assessing change in quality of life.  PHQ-9: Recent Review Flowsheet Data    Depression screen [facility] 2/9 04/03/2017 10/15/2016 04/05/2016 12/21/2015   Decreased Interest 0 0 0 0   Down, Depressed, Hopeless 0 0 0 -   PHQ - 2 Score 0 0 0 0     Interpretation of Total Score  Total Score Depression Severity:  1-4 = Minimal depression, 5-9 = Mild depression, 10-14 = Moderate depression, 15-19 = Moderately severe depression, 20-27 = Severe depression   Psychosocial Evaluation and Intervention:   Psychosocial Re-Evaluation: Psychosocial Re-Evaluation    [place] Name 04/25/17 1610 05/21/17 1551           Psychosocial Re-Evaluation   Current issues with  Current Stress Concerns  Current Stress Concerns      Interventions  Encouraged to attend Cardiac Rehabilitation for the exercise;Stress management education  Encouraged to attend Cardiac Rehabilitation for the exercise;Stress management education      Continue Psychosocial Services   Follow up required by staff  -      Comments  Ernestene is a breast cancer survivor and has good family support. Will continue to offer emotional support as needed  Marializ is a breast cancer survivor and has good family support. Will continue to offer emotional support as needed        Initial Review   Source of Stress Concerns  Chronic Illness  Chronic Illness  Psychosocial Discharge (Final Psychosocial Re-Evaluation): Psychosocial Re-Evaluation - 05/21/17 1551      Psychosocial Re-Evaluation   Current issues with  Current  Stress Concerns    Interventions  Encouraged to attend Cardiac Rehabilitation for the exercise;Stress management education    Comments  Janashia is a breast cancer survivor and has good family support. Will continue to offer emotional support as needed      Initial Review   Source of Stress Concerns  Chronic Illness       Vocational Rehabilitation: Provide vocational rehab assistance to qualifying candidates.   Vocational Rehab Evaluation & Intervention: Vocational Rehab - 03/28/17 1703      Initial Vocational Rehab Evaluation & Intervention   Assessment shows need for Vocational Rehabilitation  No Nolene does not need vocational rehab for job retriaining.       Education: Education Goals: Education classes will be provided on a weekly basis, covering required topics. Participant will state understanding/return demonstration of topics presented.  Learning Barriers/Preferences: Learning Barriers/Preferences - 03/28/17 1422      Learning Barriers/Preferences   Learning Barriers  Sight Cataract Surgery and Readers    Learning Preferences  Audio;Written Material;Video;Verbal Instruction;Skilled Demonstration;Individual Instruction;Pictoral;Computer/Internet;Group Instruction       Education Topics: Count Your Pulse:  -Group instruction provided by verbal instruction, demonstration, patient participation and written materials to support subject.  Instructors address importance of being able to find your pulse and how to count your pulse when at home without a heart monitor.  Patients get hands on experience counting their pulse with staff help and individually.   Heart Attack, Angina, and Risk Factor Modification:  -Group instruction provided by verbal instruction, video, and written materials to support subject.  Instructors address signs and symptoms of angina and heart attacks.    Also discuss risk factors for heart disease and how to make changes to improve heart health risk  factors.   Functional Fitness:  -Group instruction provided by verbal instruction, demonstration, patient participation, and written materials to support subject.  Instructors address safety measures for doing things around the house.  Discuss how to get up and down off the floor, how to pick things up properly, how to safely get out of a chair without assistance, and balance training.   CARDIAC REHAB PHASE II EXERCISE from 04/26/2017 in [place]  Date  04/26/17  Instruction Review Code  2- meets goals/outcomes      Meditation and Mindfulness:  -Group instruction provided by verbal instruction, patient participation, and written materials to support subject.  Instructor addresses importance of mindfulness and meditation practice to help reduce stress and improve awareness.  Instructor also leads participants through a meditation exercise.    CARDIAC REHAB PHASE II EXERCISE from 04/26/2017 in [place]  Date  04/03/17  Instruction Review Code  2- meets goals/outcomes      Stretching for Flexibility and Mobility:  -Group instruction provided by verbal instruction, patient participation, and written materials to support subject.  Instructors lead participants through series of stretches that are designed to increase flexibility thus improving mobility.  These stretches are additional exercise for major muscle groups that are typically performed during regular warm up and cool down.   Hands Only CPR:  -Group verbal, video, and participation provides a basic overview of AHA guidelines for community CPR. Role-play of emergencies allow participants the opportunity to practice calling for help and chest compression technique with discussion of AED use.   Hypertension: -  Group verbal and written instruction that provides a basic overview of hypertension including the most recent diagnostic guidelines, risk factor reduction with  self-care instructions and medication management.   CARDIAC REHAB PHASE II EXERCISE from 04/26/2017 in [place]  Date  04/05/17  Instruction Review Code  2- meets goals/outcomes       Nutrition I class: Heart Healthy Eating:  -Group instruction provided by PowerPoint slides, verbal discussion, and written materials to support subject matter. The instructor gives an explanation and review of the Therapeutic Lifestyle Changes diet recommendations, which includes a discussion on lipid goals, dietary fat, sodium, fiber, plant stanol/sterol esters, sugar, and the components of a well-balanced, healthy diet.   Nutrition II class: Lifestyle Skills:  -Group instruction provided by PowerPoint slides, verbal discussion, and written materials to support subject matter. The instructor gives an explanation and review of label reading, grocery shopping for heart health, heart healthy recipe modifications, and ways to make healthier choices when eating out.   CARDIAC REHAB PHASE II EXERCISE from 04/26/2017 in [place]  Date  03/28/17 [place] handouts given]  Educator  RD  Instruction Review Code  Not applicable      Diabetes Question & Answer:  -Group instruction provided by PowerPoint slides, verbal discussion, and written materials to support subject matter. The instructor gives an explanation and review of diabetes co-morbidities, pre- and post-prandial blood glucose goals, pre-exercise blood glucose goals, signs, symptoms, and treatment of hypoglycemia and hyperglycemia, and foot care basics.   CARDIAC REHAB PHASE II EXERCISE from 04/26/2017 in [place]  Date  04/12/17  Instruction Review Code  2- meets goals/outcomes      Diabetes Blitz:  -Group instruction provided by PowerPoint slides, verbal discussion, and written materials to support subject matter. The instructor gives an explanation and  review of the physiology behind type 1 and type 2 diabetes, diabetes medications and rational behind using different medications, pre- and post-prandial blood glucose recommendations and Hemoglobin A1c goals, diabetes diet, and exercise including blood glucose guidelines for exercising safely.    Portion Distortion:  -Group instruction provided by PowerPoint slides, verbal discussion, written materials, and food models to support subject matter. The instructor gives an explanation of serving size versus portion size, changes in portions sizes over the last 20 years, and what consists of a serving from each food group.   Stress Management:  -Group instruction provided by verbal instruction, video, and written materials to support subject matter.  Instructors review role of stress in heart disease and how to cope with stress positively.     Exercising on Your Own:  -Group instruction provided by verbal instruction, power point, and written materials to support subject.  Instructors discuss benefits of exercise, components of exercise, frequency and intensity of exercise, and end points for exercise.  Also discuss use of nitroglycerin and activating EMS.  Review options of places to exercise outside of rehab.  Review guidelines for sex with heart disease.   Cardiac Drugs I:  -Group instruction provided by verbal instruction and written materials to support subject.  Instructor reviews cardiac drug classes: antiplatelets, anticoagulants, beta blockers, and statins.  Instructor discusses reasons, side effects, and lifestyle considerations for each drug class.   Cardiac Drugs II:  -Group instruction provided by verbal instruction and written materials to support subject.  Instructor reviews cardiac drug classes: angiotensin converting enzyme inhibitors (ACE-I), angiotensin II receptor blockers (ARBs), nitrates, and calcium channel blockers.  Instructor discusses reasons, side effects, and lifestyle  considerations for each drug class.   CARDIAC REHAB PHASE II EXERCISE from 04/26/2017 in [place]  Instruction Review Code  2- meets goals/outcomes      Anatomy and Physiology of the Circulatory System:  Group verbal and written instruction and models provide basic cardiac anatomy and physiology, with the coronary electrical and arterial systems. Review of: AMI, Angina, Valve disease, Heart Failure, Peripheral Artery Disease, Cardiac Arrhythmia, Pacemakers, and the ICD.   Other Education:  -Group or individual verbal, written, or video instructions that support the educational goals of the cardiac rehab program.   Knowledge Questionnaire Score: Knowledge Questionnaire Score - 03/28/17 1626      Knowledge Questionnaire Score   Pre Score  23/24       Core Components/Risk Factors/Patient Goals at Admission: Personal Goals and Risk Factors at Admission - 03/28/17 1709      Core Components/Risk Factors/Patient Goals on Admission    Weight Management  Yes;Weight Maintenance    Intervention  Weight Management: Develop a combined nutrition and exercise program designed to reach desired caloric intake, while maintaining appropriate intake of nutrient and fiber, sodium and fats, and appropriate energy expenditure required for the weight goal.;Weight Management: Provide education and appropriate resources to help participant work on and attain dietary goals.;Weight Management/Obesity: Establish reasonable short term and long term weight goals.    Admit Weight  139 lb 12.4 oz (63.4 kg)    Goal Weight: Short Term  139 lb (63 kg)    Goal Weight: Long Term  139 lb (63 kg)    Expected Outcomes  Short Term: Continue to assess and modify interventions until short term weight is achieved;Long Term: Adherence to nutrition and physical activity/exercise program aimed toward attainment of established weight goal;Weight Maintenance: Understanding of the daily nutrition  guidelines, which includes 25-35% calories from fat, 7% or less cal from saturated fats, less than 200mg  cholesterol, less than 1.5gm of sodium, & 5 or more servings of fruits and vegetables daily    Hypertension  Yes    Intervention  Provide education on lifestyle modifcations including regular physical activity/exercise, weight management, moderate sodium restriction and increased consumption of fresh fruit, vegetables, and low fat dairy, alcohol moderation, and smoking cessation.;Monitor prescription use compliance.    Expected Outcomes  Short Term: Continued assessment and intervention until BP is < 140/1mm HG in hypertensive participants. < 130/32mm HG in hypertensive participants with diabetes, heart failure or chronic kidney disease.;Long Term: Maintenance of blood pressure at goal levels.    Lipids  Yes    Intervention  Provide education and support for participant on nutrition & aerobic/resistive exercise along with prescribed medications to achieve LDL 70mg , HDL >40mg .    Expected Outcomes  Short Term: Participant states understanding of desired cholesterol values and is compliant with medications prescribed. Participant is following exercise prescription and nutrition guidelines.;Long Term: Cholesterol controlled with medications as prescribed, with individualized exercise RX and with personalized nutrition plan. Value goals: LDL < 70mg , HDL > 40 mg.    Stress  Yes    Intervention  Offer individual and/or small group education and counseling on adjustment to heart disease, stress management and health-related lifestyle change. Teach and support self-help strategies.;Refer participants experiencing significant psychosocial distress to appropriate mental health specialists for further evaluation and treatment. When possible, include family members and significant others in education/counseling sessions.    Expected Outcomes  Short Term: Participant demonstrates changes in health-related behavior,  relaxation and other stress management skills, ability to obtain effective social support, and compliance with psychotropic medications if prescribed.;Long Term: Emotional wellbeing is indicated by absence of clinically significant psychosocial distress or social isolation.       Core Components/Risk Factors/Patient Goals Review:  Goals and Risk Factor Review    Row Name 04/25/17 1608 05/21/17 1551           Core Components/Risk Factors/Patient Goals Review   Personal Goals Review  Weight Management/Obesity;Hypertension  Weight Management/Obesity;Hypertension      Review  Berlin's vital signs have been stable at cardiac rehab. Rilya has maintained her weight.  Xylia's vital signs have been stable at cardiac rehab. Lyah has maintained her weight.      Expected Outcomes  Jessiah will continue to partcipate in phase 2 cardiac rehab and take her medications as presribed.  Elyza will continue to partcipate in phase 2 cardiac rehab and take her medications as presribed.         Core Components/Risk Factors/Patient Goals at Discharge (Final Review):  Goals and Risk Factor Review - 05/21/17 1551      Core Components/Risk Factors/Patient Goals Review   Personal Goals Review  Weight Management/Obesity;Hypertension    Review  Laritza's vital signs have been stable at cardiac rehab. Charmain has maintained her weight.    Expected Outcomes  Sae will continue to partcipate in phase 2 cardiac rehab and take her medications as presribed.       ITP Comments: ITP Comments    Row Name 03/28/17 1305 04/25/17 1606 05/21/17 1550       ITP Comments  Dr Fransico Him, Medical Director  30 Day ITP Review. Gianne has good atttendance and participation in phase 2 cardiac rehab. Coreena will be out during the Holiday season to visit with family out of town  79 Day ITP Review. Daffney has good atttendance and participation in phase 2 cardiac rehab. Naasia retruned to exercise today after being out of town visitng family.         Comments: See ITP comments. Mandi returned to exercise today at cardiac rehab.Barnet Pall, RN,BSN 05/22/2017 4:28 PM

## 2017-05-22 ENCOUNTER — Encounter (HOSPITAL_COMMUNITY): Payer: Medicare Other

## 2017-05-22 ENCOUNTER — Telehealth (HOSPITAL_COMMUNITY): Payer: Self-pay | Admitting: Internal Medicine

## 2017-05-24 ENCOUNTER — Encounter (HOSPITAL_COMMUNITY): Payer: Medicare Other

## 2017-05-27 ENCOUNTER — Encounter (HOSPITAL_COMMUNITY)
Admission: RE | Admit: 2017-05-27 | Discharge: 2017-05-27 | Disposition: A | Discharge status: Home / Self Care | Payer: Medicare Other | Source: Ambulatory Visit | Attending: Interventional Cardiology | Admitting: Interventional Cardiology

## 2017-05-27 ENCOUNTER — Encounter (HOSPITAL_COMMUNITY): Payer: Medicare Other

## 2017-05-27 DIAGNOSIS — I214 Non-ST elevation (NSTEMI) myocardial infarction: Secondary | ICD-10-CM | POA: Diagnosis not present

## 2017-05-27 DIAGNOSIS — Z955 Presence of coronary angioplasty implant and graft: Secondary | ICD-10-CM

## 2017-05-27 NOTE — Progress Notes (Signed)
[place]  [phone] [phone]) [phone]     ID: Brianna BUSSA DOB: Sep 05, 1945  MR#: [id number]  PNP#:[id number]  Patient Care Team: Seward Carol, MD as PCP - General (Internal Medicine) Eashan Schipani, Virgie Dad, MD as Consulting Physician (Oncology) Fanny Skates, MD as Consulting Physician (General Surgery) Luberta Robertson, MD (Radiology) Hennie Duos, MD as Consulting Physician (Rheumatology) Domingo Pulse, MD (Urology) Druscilla Brownie, MD as Consulting Physician (Dermatology) Luberta Mutter, MD as Consulting Physician (Ophthalmology) Magnus Sinning, MD as Consulting Physician (Physical Medicine and Rehabilitation) Benson Norway, RN as Registered Nurse (Oncology) Megan Salon, MD as Consulting Physician (Gynecology) Loney Loh, MD (Dermatology) OTHER MD:  CHIEF COMPLAINT: Estrogen receptor positive breast cancer  CURRENT TREATMENT:  Anastrozole  INTERVAL HISTORY: [name] returns today for follow-up and treatment of her estrogen receptor positive breast cancer. She continues on anastrozole, with good tolerance. She notes that her hot flashes are sporadic, but they aren't a major issue for her. She notes some vaginal dryness issues which she aids with vitamin E suppositories. She notes that while she was having Taxotere treatments, she lost a significant amount of hair and notes that it has been difficult to grow back. She notes that she wears a laser hair growth cap 3 times per week along with using Rogaine. She notes that she is aware of a class action lawsuit against patients who experienced permanent hair loss after Taxotere treatments.  She feels she likely qualifies since she has a large bald spot in the crown of her head from the Taxotere treatment.  She is participating in the [place] trial, under observation (no palbociclib).   REVIEW OF SYSTEMS: [name] reports that she is doing well. She notes that she has been reading about  her family history. She notes that her holidays were chaotic but wonderful due to her family and pets visiting. She notes that her granddaughter is turning 53 years old 06/02/2017. She also notes that she went to [place] to "sight see" with her sister and brother-in-law. She notes that she is going to cardiac rehabilitation for 2 hours 3 days per week.  She notes that she walks about 3-4 miles per day while at work and 1-2 miles on her off days. She notes that she still has issues with fibromyalgia. 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 She notes that she previously took Neurontin, but it made her jittery. She notes that she is having the penicillin test tomorrow. . She denies unusual headaches, visual changes, nausea, vomiting, or dizziness. There has been no unusual cough, phlegm production, or pleurisy. This been no change in bowel or bladder habits. She denies unexplained fatigue or unexplained weight loss, bleeding, rash, or fever. A detailed review of systems was otherwise stable.    BREAST CANCER HISTORY: From the original intake note:  [name] had routine mammographic screening in 08/18/2015 at the [facility], showing a right breast mass and possible axillary adenopathy. On 08/23/2015 she underwent bilateral diagnostic mammography with tomography and bilateral breast ultrasonography. The breast density was category B in the right breast there was a spiculated mass in the upper-outer quadrant measuring 1.2 cm and associated with pleomorphic calcifications. There were calcifications extending anteriorly and posteriorly from the mass worrisome for ductal carcinoma in situ. The total area in question is about 3 cm. There were also multiple enlarged right axillary lymph nodes. The mass was palpated as an area of fullness at the 12:00 position  of the breast 3 cm from the nipple. There were palpable enlarged right axillary lymph nodes in the right axilla. Ultrasonography of  the right breast confirmed an irregular hypoechoic mass in the area in question measuring 1.6 cm. There were multiple abnormal appearing enlarged right axillary lymph nodes, the largest measuring 2.3 cm.  In the left breast, mammography showed an area of architectural distortion associated with the left nipple. This was not palpable. Ultrasonography of the left breast found no correlate. This area of the left breast was biopsied under tomography 08/24/2015. This showed (SAA [id number]) a complex sclerosing lesion and fibroadenoma.  On 08/23/2015 the patient underwent biopsy of the right breast mass in 1 of the abnormal right axillary lymph nodes. 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 Both biopsies were HER-2 nonamplified, the breast being ratio 1.27 and number per cell 1.90, and the lymph node 1 ratio 1.24 and number per cell 3.0.  On 08/26/2015 the patient underwent bilateral breast MRI. In the left breast there was no suspicious enhancement. The left axilla was benign. In the right breast there was a mass described as in the upper inner quadrant measuring 2.3 cm. Less than a centimeter away from this mass there was a satellite nodule measuring 0.9 cm. In the right axilla there were multiple enlarged right axillary lymph nodes, including a confluent region measuring up to 4 cm. There were also enlarged level II lymph nodes present measuring up to 2.1 cm.  The patient's subsequent history is as detailed below  PAST MEDICAL HISTORY: Past Medical History:  Diagnosis Date  . Anxiety   . Arthritis 2008  . Asthma 1953   age 21  . Breast cancer ([place])   . Cancer [facility])    breast cancer  . Chronic vaginitis 1998  . Fibromyalgia 1982  . GERD (gastroesophageal reflux disease)    since at least 2011  . Hemorrhoids 2009  . History of radiation  therapy 01/18/16-03/05/16   right breast/nodal areas 45 Gy, right breast boost 16 Gy  . Hypoglycemia 2003  . Hypothyroidism 1964   age 23  . Interstitial cystitis 1998  . Irritable bowel syndrome 06/14/2009   Qualifier: Diagnosis of  By: Patsy Baltimore RN, Langley Gauss    . Menopausal symptoms   . Osteoporosis 12/24/2006  . Personal history of chemotherapy   . Personal history of radiation therapy   . Pneumonia 1979   varicella  . PONV (postoperative nausea and vomiting) 1979    PAST SURGICAL HISTORY: Past Surgical History:  Procedure Laterality Date  . BLADDER HYPEREXTENSION     X4, prior to 01/2006  . BLADDER SUSPENSION    . BREAST BIOPSY    . BREAST LUMPECTOMY Right    2017  . BREAST LUMPECTOMY WITH NEEDLE LOCALIZATION AND AXILLARY LYMPH NODE DISSECTION Right 12/05/2015   Procedure: RIGHT BREAST NEEDLE LOCALIZED (WIRES X 2) LUMPECTOMY WITH AXILLARY LYMPH NODE DISSECTION;  Surgeon: Fanny Skates, MD;  Location: [place];  Service: General;  Laterality: Right;  . BREAST LUMPECTOMY WITH RADIOACTIVE SEED LOCALIZATION Left 12/05/2015   Procedure: RADIOACTIVE SEED GUIDED LEFT BREAST LUMPECTOMY;  Surgeon: Fanny Skates, MD;  Location: [place];  Service: General;  Laterality: Left;  . BREAST SURGERY    . BUNIONECTOMY     prior to 01/2006  . CARDIAC CATHETERIZATION    . CESAREAN SECTION  1979  .  CHOLECYSTECTOMY  02/08/2006   DR. INGRAM  . COLONOSCOPY    . CORONARY STENT INTERVENTION N/A 01/01/2017   Procedure: CORONARY STENT INTERVENTION;  Surgeon: Jettie Booze, MD;  Location: [place] CV LAB;  Service: Cardiovascular;  Laterality: N/A;  . DENTAL SURGERY  2009-2016   13 dental implants  . LEFT HEART CATH AND CORONARY ANGIOGRAPHY N/A 01/01/2017   Procedure: LEFT HEART CATH AND CORONARY ANGIOGRAPHY;  Surgeon: Jettie Booze, MD;  Location: [place] [facility];  Service: Cardiovascular;  Laterality: N/A;  . LEG SURGERY  07/06/2004   Broken tibia  . NASAL SINUS SURGERY     X4; prior to  01/2006  . PORT-A-CATH REMOVAL Right 12/05/2015   Procedure: REMOVAL PORT-A-CATH;  Surgeon: Fanny Skates, MD;  Location: [place];  Service: General;  Laterality: Right;  . PORTACATH PLACEMENT Left 09/08/2015   Procedure: INSERTION OF PORT-A-CATH ;  Surgeon: Fanny Skates, MD;  Location: [facility] OR;  Service: General;  Laterality: Left;    FAMILY HISTORY Family History  Problem Relation Age of Onset  . Hypertension Mother   . Heart disease Mother        d. 33  . Parkinsonism Father        d. 62  . Heart disease Sister   . Breast cancer Sister 62       s/p mastectomy; reportedly negative BRCA1/2 testing approx 7 years ago  . Melanoma Sister        d. 26s; hx of incresed sun exposure  . Cancer Sister        dx. sarcoma of her face in her 48s; treated w/ MOHS surgery  . Parkinsonism Paternal Aunt        d. 90s  . Parkinsonism Paternal Uncle        d. late 65s  . Kidney failure Maternal Grandmother 36  . Heart attack Maternal Grandfather        d. 17s  . Heart disease Paternal Grandmother        d. 23  . Breast cancer Other 57       maternal great aunt (MGF's sister)  . Kidney cancer Paternal Uncle 15       d. 78s; unsure about smoking status  The patient's father died at age 26 from complications of Parkinson's disease. The patient's mother died age 40 with heart disease. [name] had no brothers. She had 2 sisters. One sister was diagnosed with breast cancer at age 87. She was tested for the BRCA gene and was negative. The same sister also had melanoma diagnosed in her 65s. The patient has one maternal great aunt diagnosed with breast cancer around age 56. There is no history of ovarian cancer in the family  GYNECOLOGIC HISTORY:  Patient's last menstrual period was 04/30/1996 (approximate). Menarche age 17, first live birth age 23. The patient is GX P2. She stopped having periods in her early 79s and took hormone replacement until her breast cancer diagnosis, April 2017.  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 He has been in remission for more than 10 years. Their daughter Nira Conn lives in [place] were she is Garment/textile technologist of the [employer]. Daughter Belenda Cruise is an infectious disease M.D. working and teaching at [employer] in [place]. The patient has 2 grandchildren.  The family attends [employer]: Not in place   HEALTH MAINTENANCE: Social History   Tobacco Use  . Smoking status: Former Smoker    Packs/day: 0.25    Years: 15.00    Pack years: 3.75    Types: Cigarettes  . Smokeless tobacco: Never Used  . Tobacco comment: social smoker/"on and off"  Substance Use Topics  . Alcohol use: Yes    Alcohol/week: 0.0 oz    Comment: social  . Drug use: No     Colonoscopy: 2016/ [facility]  PAP: 2015  Bone density: October 2018, T-score: -2.7  Lipid panel:  Allergies  Allergen Reactions  . Penicillins Other (See Comments)    Has patient had a PCN reaction causing immediate rash, facial/tongue/throat swelling, SOB or lightheadedness with hypotension: No Has patient had a PCN reaction causing severe rash involving mucus membranes or skin necrosis: No Has patient had a PCN reaction that required hospitalization No Has patient had a PCN reaction occurring within the last 10 years: No If all of the above answers are "NO", then may proceed with Cephalosporin use.  . Chlorhexidine Other (See Comments)    [place] IS OK. The CLOTH WIPE causes reaction. Causes itching all over the area  . Other Rash    Adhesive tape    Current Outpatient Medications  Medication Sig Dispense Refill  . albuterol (PROVENTIL HFA;VENTOLIN HFA) 108 (90 Base) MCG/ACT inhaler Inhale 1-2 puffs into the lungs every 6 (six) hours as needed for wheezing or shortness of  breath. Started in 1998    . anastrozole (ARIMIDEX) 1 MG tablet Take 1 tablet (1 mg total) by mouth daily. 90 tablet 4  . aspirin EC 81 MG EC tablet Take 1 tablet (81 mg total) by mouth daily. 30 tablet 0  . buPROPion (WELLBUTRIN XL) 300 MG 24 hr tablet Take 300 mg by mouth daily.     . Calcium Carbonate-Vitamin D (CALCIUM + D PO) Take 1 tablet by mouth at bedtime. Started approximately 1998    . carisoprodol (SOMA) 350 MG tablet Take 350 mg by mouth 2 (two) times daily as needed for muscle spasms. Started in 2008.  2  . diazepam (VALIUM) 5 MG tablet Take 5 mg by mouth every 6 (six) hours as needed for muscle spasms (interstitial cystitis). Started approximately 2008.    Marland Kitchen ezetimibe (ZETIA) 10 MG tablet Take 1 tablet (10 mg total) by mouth daily. 30 tablet 11  . fluconazole (DIFLUCAN) 100 MG tablet Take 1 tablet (100 mg total) by mouth daily. 30 tablet 3  . furosemide (LASIX) 20 MG tablet Take 1 tablet (20 mg total) by mouth daily as needed (shortness of breath). 30 tablet 3  . HYDROcodone-acetaminophen (NORCO) 10-325 MG tablet Take 1 tablet by mouth every 4 (four) hours as needed for moderate pain or severe pain (as needed for fibromyalgia and IC).     . hydrOXYzine (VISTARIL) 25 MG capsule Take 25 mg by mouth 3 times/day as needed-between meals & bedtime.     Marland Kitchen levothyroxine (SYNTHROID) 137 MCG tablet Take 137 mcg by mouth daily.     Marland Kitchen LORazepam (ATIVAN) 2 MG tablet Take 1 tablet by mouth daily as needed for anxiety.   5  . metoprolol succinate (TOPROL-XL) 25 MG 24 hr tablet Take 0.5 tablets (12.5 mg total) by mouth daily. 15 tablet 6  . minoxidil (ROGAINE) 2 % external solution Apply topically 2 (two) times daily.    . Multiple Vitamin (MULTIVITAMIN)  capsule Take 1 capsule by mouth daily. Taking since at least 1988.    . nitroGLYCERIN (NITROSTAT) 0.4 MG SL tablet Place 1 tablet (0.4 mg total) under the tongue every 5 (five) minutes as needed for chest pain. 25 tablet 3  . NONFORMULARY OR  COMPOUNDED ITEM Place 1 suppository vaginally daily. compounded medication - vitamin E    . ondansetron (ZOFRAN) 4 MG tablet Take 1 tablet by mouth daily as needed for nausea or vomiting.   3  . pantoprazole (PROTONIX) 40 MG tablet Take 40 mg by mouth 2 (two) times daily. Started in 2012.    . potassium chloride (K-DUR) 10 MEQ tablet TAKE 1 TABLET BY MOUTH AS NEEDED. TAKE WITH LASIX 30 tablet 11  . prochlorperazine (COMPAZINE) 10 MG tablet Take 1 tablet (10 mg total) every 6 (six) hours as needed by mouth (Nausea or vomiting). 30 tablet 1  . terconazole (TERAZOL 7) 0.4 % vaginal cream Place 1 applicator vaginally 2 (two) times daily as needed (irritation).     . ticagrelor (BRILINTA) 90 MG TABS tablet Take 1 tablet (90 mg total) by mouth 2 (two) times daily. 60 tablet 0  . Vitamin D, Ergocalciferol, (DRISDOL) 50000 units CAPS capsule Take 1 capsule (50,000 Units total) by mouth every 7 (seven) days. 12 capsule 3  . zolpidem (AMBIEN) 10 MG tablet Take 10 mg by mouth at bedtime as needed for sleep.      No current facility-administered medications for this visit.      OBJECTIVE: Middle-aged white woman no acute distress  Vitals:   05/29/17 1426  BP: 132/75  Pulse: (!) 55  Resp: 18  SpO2: 99%     Body mass index is 24.18 kg/m.    ECOG FS:1 - Symptomatic but completely ambulatory  The crown of the head remains essentially bald. Sclerae unicteric, EOMs intact Oropharynx clear and moist No cervical or supraclavicular adenopathy Lungs no rales or rhonchi Heart regular rate and rhythm Abd soft, nontender, positive bowel sounds MSK no focal spinal tenderness, no upper extremity lymphedema Neuro: nonfocal, well oriented, appropriate affect Breasts: The right breast continues to look more and more normal.  It is imaged below.  I do not palpate any masses.  The left breast is unremarkable.  Both axillae are benign.  Right breast 08/15/2016    Right Breast 05/29/2017      LAB  RESULTS:  CMP     Component Value Date/Time   NA 140 05/29/2017 1338   NA 138 03/11/2017 1524   K 3.6 05/29/2017 1338   K 3.8 03/11/2017 1524   CL 105 05/29/2017 1338   CO2 26 05/29/2017 1338   CO2 25 03/11/2017 1524   GLUCOSE 97 05/29/2017 1338   GLUCOSE 136 03/11/2017 1524   BUN 13 05/29/2017 1338   BUN 13.2 03/11/2017 1524   CREATININE 0.84 05/29/2017 1338   CREATININE 0.8 03/11/2017 1524   CALCIUM 8.9 05/29/2017 1338   CALCIUM 9.6 03/11/2017 1524   PROT 6.7 05/29/2017 1338   PROT 7.6 03/11/2017 1524   ALBUMIN 3.6 05/29/2017 1338   ALBUMIN 3.8 03/11/2017 1524   AST 27 05/29/2017 1338   AST 24 03/11/2017 1524   ALT 32 05/29/2017 1338   ALT 38 03/11/2017 1524   ALKPHOS 106 05/29/2017 1338   ALKPHOS 166 (H) 03/11/2017 1524   BILITOT 0.6 05/29/2017 1338   BILITOT 0.44 03/11/2017 1524   GFRNONAA >60 05/29/2017 1338   GFRAA >60 05/29/2017 1338    INo results found  for: SPEP, UPEP  Lab Results  Component Value Date   WBC 8.7 05/29/2017   NEUTROABS 6.5 05/29/2017   HGB 13.2 05/29/2017   HCT 39.1 05/29/2017   MCV 95.0 05/29/2017   PLT 280 05/29/2017      Chemistry      Component Value Date/Time   NA 140 05/29/2017 1338   NA 138 03/11/2017 1524   K 3.6 05/29/2017 1338   K 3.8 03/11/2017 1524   CL 105 05/29/2017 1338   CO2 26 05/29/2017 1338   CO2 25 03/11/2017 1524   BUN 13 05/29/2017 1338   BUN 13.2 03/11/2017 1524   CREATININE 0.84 05/29/2017 1338   CREATININE 0.8 03/11/2017 1524      Component Value Date/Time   CALCIUM 8.9 05/29/2017 1338   CALCIUM 9.6 03/11/2017 1524   ALKPHOS 106 05/29/2017 1338   ALKPHOS 166 (H) 03/11/2017 1524   AST 27 05/29/2017 1338   AST 24 03/11/2017 1524   ALT 32 05/29/2017 1338   ALT 38 03/11/2017 1524   BILITOT 0.6 05/29/2017 1338   BILITOT 0.44 03/11/2017 1524       No results found for: LABCA2  No components found for: LABCA125  No results for input(s): INR in the last 168 hours.  Urinalysis    Component  Value Date/Time   COLORURINE YELLOW 01/03/2017 1205   APPEARANCEUR HAZY (A) 01/03/2017 1205   LABSPEC 1.006 01/03/2017 1205   PHURINE 7.0 01/03/2017 1205   GLUCOSEU NEGATIVE 01/03/2017 1205   HGBUR SMALL (A) 01/03/2017 1205   BILIRUBINUR NEGATIVE 01/03/2017 1205   BILIRUBINUR n 10/01/2016 1316   KETONESUR NEGATIVE 01/03/2017 1205   PROTEINUR NEGATIVE 01/03/2017 1205   UROBILINOGEN 0.2 10/01/2016 1316   NITRITE NEGATIVE 01/03/2017 1205   LEUKOCYTESUR LARGE (A) 01/03/2017 1205      ELIGIBLE FOR AVAILABLE RESEARCH PROTOCOL: PALLAS, neuropathy study  STUDIES: Diagnostic Bilateral Breast Mammogram with TOMO, 01/30/2017, breast density category B, shows no evidence of malignancy.   Bone Density, 01/30/2017, T-score of -2.7  ASSESSMENT: 72 y.o. [place] woman status post right breast upper outer quadrant and right axillary lymph node biopsy 08/23/2015, both positive for a clinical T1 N2, stage IIIA invasive ductal carcinoma, grade 2, strongly estrogen receptor positive, 5% progesterone receptor positive, with an MIB-120-30% and no HER-2 amplification  (a) biopsy of a 0.9 cm Right breast satellite nodule 09/13/2015 showed invasive ductal carcinoma, grade 2, E-cadherin strongly positive, with a prognostic panel identical to the larger right upper quadrant mass  (1) left breast biopsy 08/24/2015 shows a complex sclerosing lesion, excised 12/05/2015  (2) neoadjuvant chemotherapy consisting of cyclophosphamide and docetaxel every 21 days 4, with OnPro support, started 09/12/2015, completed 11/14/2015  (3) status post right lumpectomy and sentinel lymph node dissection 12/05/2015 for a residual pT2 pN3, stage IIIC invasive ductal carcinoma, grade 2, with a close anterior margin (skin)  (4) adjuvant radiation 01/18/16 - 11/6/17with capecitabine chemo-sensitization   1) Right breast/nodal areas: 45 Gy in 25 fractions                         2) Right breast boost: 16 Gy in 8 fractions  (5)  capecitabine started 04/13/2016, discontinued January 2018 with mild/moderate [name] effects developing  (6) anastrozole started 06/01/2016  (a) bone density January 30, 2017 shows a T score of -2.7 (osteoporosis)  (7) genetics testing ([place]kenazi descent) 02/07/2016 through the Breast/Ovarian Cancer Panel offered by [facility] Hope Pigeon, MD) i found no deleterious mutations  in  ATM, BARD1, BRCA1, BRCA2, BRIP1, CDH1, CHEK2, FANCC, MLH1, MSH2, MSH6, NBN, PALB2, PMS2, PTEN, RAD51C, RAD51D, TP53, and XRCC2.  This panel also includes deletion/duplication analysis (without sequencing) for one gene, EPCAM.  (8) consented to PALLAS trial   (a) randomized to anastrozole alone  PLAN: Jeani is now a year and a half out from definitive surgery for her breast cancer with no evidence of disease recurrence.  This is very favorable.  She continues on anastrozole generally with good tolerance.  Hot flashes are occasional.  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  And of course her hair still has not grown back.  She is going to return to see me in April.  Of course I will be glad to see her at any point before that if and when the need arises    Merinda Victorino, Virgie Dad, MD  05/29/17 2:59 PM Medical Oncology and Hematology [facility] [place], [place] [address] Tel. [phone]    Fax. [phone]  This document serves as a record of services personally performed by Lurline Del, MD. It was created on his behalf by Sheron Nightingale, a trained medical scribe. The creation of this record is based on the scribe's personal observations and the provider's statements to them.   I have reviewed the above documentation for accuracy and completeness, and I agree with the above.

## 2017-05-29 ENCOUNTER — Encounter (HOSPITAL_COMMUNITY)
Admission: RE | Admit: 2017-05-29 | Discharge: 2017-05-29 | Disposition: A | Discharge status: Home / Self Care | Payer: Medicare Other | Source: Ambulatory Visit | Attending: Interventional Cardiology | Admitting: Interventional Cardiology

## 2017-05-29 ENCOUNTER — Inpatient Hospital Stay: Payer: Medicare Other | Admitting: *Deleted

## 2017-05-29 ENCOUNTER — Encounter (HOSPITAL_COMMUNITY): Payer: Medicare Other

## 2017-05-29 ENCOUNTER — Inpatient Hospital Stay: Payer: Medicare Other

## 2017-05-29 ENCOUNTER — Telehealth: Payer: Self-pay | Admitting: Oncology

## 2017-05-29 ENCOUNTER — Inpatient Hospital Stay: Payer: Medicare Other | Attending: Oncology | Admitting: Oncology

## 2017-05-29 VITALS — BP 132/75 | HR 55 | Resp 18 | Ht 63.5 in | Wt 138.7 lb

## 2017-05-29 DIAGNOSIS — I200 Unstable angina: Secondary | ICD-10-CM

## 2017-05-29 DIAGNOSIS — Z923 Personal history of irradiation: Secondary | ICD-10-CM | POA: Diagnosis not present

## 2017-05-29 DIAGNOSIS — Z006 Encounter for examination for normal comparison and control in clinical research program: Secondary | ICD-10-CM | POA: Diagnosis present

## 2017-05-29 DIAGNOSIS — I214 Non-ST elevation (NSTEMI) myocardial infarction: Secondary | ICD-10-CM | POA: Diagnosis not present

## 2017-05-29 DIAGNOSIS — G620 Drug-induced polyneuropathy: Secondary | ICD-10-CM

## 2017-05-29 DIAGNOSIS — Z9221 Personal history of antineoplastic chemotherapy: Secondary | ICD-10-CM | POA: Diagnosis not present

## 2017-05-29 DIAGNOSIS — M810 Age-related osteoporosis without current pathological fracture: Secondary | ICD-10-CM | POA: Insufficient documentation

## 2017-05-29 DIAGNOSIS — Z79811 Long term (current) use of aromatase inhibitors: Secondary | ICD-10-CM | POA: Insufficient documentation

## 2017-05-29 DIAGNOSIS — Z170 Estrogen receptor positive status [ER+]: Principal | ICD-10-CM

## 2017-05-29 DIAGNOSIS — C50411 Malignant neoplasm of upper-outer quadrant of right female breast: Secondary | ICD-10-CM

## 2017-05-29 DIAGNOSIS — C773 Secondary and unspecified malignant neoplasm of axilla and upper limb lymph nodes: Secondary | ICD-10-CM | POA: Insufficient documentation

## 2017-05-29 DIAGNOSIS — Z955 Presence of coronary angioplasty implant and graft: Secondary | ICD-10-CM

## 2017-05-29 LAB — HEMOGLOBIN A1C
HEMOGLOBIN A1C: 5.2 % (ref 4.8–5.6)
MEAN PLASMA GLUCOSE: 102.54 mg/dL

## 2017-05-29 LAB — COMPREHENSIVE METABOLIC PANEL
ALT: 32 U/L (ref 0–55)
ANION GAP: 9 (ref 3–11)
AST: 27 U/L (ref 5–34)
Albumin: 3.6 g/dL (ref 3.5–5.0)
Alkaline Phosphatase: 106 U/L (ref 40–150)
BILIRUBIN TOTAL: 0.6 mg/dL (ref 0.2–1.2)
BUN: 13 mg/dL (ref 7–26)
CO2: 26 mmol/L (ref 22–29)
Calcium: 8.9 mg/dL (ref 8.4–10.4)
Chloride: 105 mmol/L (ref 98–109)
Creatinine, Ser: 0.84 mg/dL (ref 0.60–1.10)
GFR calc Af Amer: >60 mL/min (ref >60)
Glucose, Bld: 97 mg/dL (ref 70–140)
POTASSIUM: 3.6 mmol/L (ref 3.5–5.1)
Sodium: 140 mmol/L (ref 136–145)
TOTAL PROTEIN: 6.7 g/dL (ref 6.4–8.3)

## 2017-05-29 LAB — CBC WITH DIFFERENTIAL/PLATELET
BASOS ABS: 0 10*3/uL (ref 0.0–0.1)
Basophils Relative: 1 %
Eosinophils Absolute: 0.1 10*3/uL (ref 0.0–0.5)
Eosinophils Relative: 2 %
HEMATOCRIT: 39.1 % (ref 34.8–46.6)
HEMOGLOBIN: 13.2 g/dL (ref 11.6–15.9)
LYMPHS PCT: 18 %
Lymphs Abs: 1.5 10*3/uL (ref 0.9–3.3)
MCH: 32 pg (ref 25.1–34.0)
MCHC: 33.7 g/dL (ref 31.5–36.0)
MCV: 95 fL (ref 79.5–101.0)
Monocytes Absolute: 0.4 10*3/uL (ref 0.1–0.9)
Monocytes Relative: 5 %
NEUTROS PCT: 74 %
Neutro Abs: 6.5 10*3/uL (ref 1.5–6.5)
PLATELETS: 280 10*3/uL (ref 145–400)
RBC: 4.11 MIL/uL (ref 3.70–5.45)
RDW: 13.5 % (ref 11.2–14.5)
WBC: 8.7 10*3/uL (ref 3.9–10.3)

## 2017-05-29 NOTE — Telephone Encounter (Signed)
Gave patient AVS and calendar of upcoming April appointments.  °

## 2017-05-29 NOTE — Progress Notes (Signed)
05/29/2017 Upon arrival to clinic, patient was given her questionnaires for PROs and adherence , which were completed by her independently. Patient returned her completed anti-hormone therapy drug diaries for Cycles 9-11, indicating no missed doses. Note that patient's Cycle 12 visit was delayed to today, within the +7-day window allowed, due to her travel out of town. She has just recently returned from a trip to [place]. Patient was given anti-hormone therapy drug diaries for cycles 12-14, and she states that she is able to complete the last week's worth of dose recording, since she has taken the medication consistently daily.. Patient reports ongoing complaints from baseline, and has moderate vaginal dryness and "random" mild hot flashes. She still has significant alopecia but has noticed some hair growth. Review of adverse events is outlined in the table below. Based on lab results review and H&P by Dr. Jana Hakim, patient condition is acceptable for continued treatment on study. Plans made for return visit in April 2019. Cindy S. Brigitte Pulse BSN, RN, CCRP 05/29/2017 3:56 PM  Adverse Event Log Protocol: AFT-05 PALLAS Cycles 9-11: 03/11/2017 - 05/30/2017 (cycle end date = 05/21/2017) Baseline AEs - improved to grade 1: neuropathy (10/12/16) and fatigue (08/15/16) Event Grade Onset Date Resolved Date Attribution to anastrozole Treatment/ Comments  Vaginal dryness Grade 1 08/01/16 ongoing Yes   Urinary incontinence- stress  Grade 1  08/15/16  ongoing  No   Atrophic vaginitis Grade 2 09/13/16 ongoing Yes Vitamin E vaginal suppositories  Sinus bradycardia, int. Grade 1 12/31/16 ongoing No   LV diastolic dysfunction Grade 1 01/02/17 ongoing No   Hyperlipidemia Grade 2 01/02/17 ongoing Yes Statin started 01/01/17 for ACS  Chronic diastolic CHF Grade 2 11/22/34 ongoing No Lasix with prophylactic K-dur  Tooth infection Grade 2 02/22/17 03/25/17 No Cipro, clindamycin. Removal of implant  Bronchitis Grade 2 03/11/17 04/08/17  No Z-pack, prednisone  Increased alkaline phosphatase Grade 1 03/11/17 05/29/17 No   Weight loss Grade 2 03/11/17 ongoing No   Bruising Grade 1 04/01/17 ongoing No Related to Brilinta  Upper respiratory infection Grade 2 05/19/17 05/28/17 No   Cindy S. Brigitte Pulse BSN, RN, [facility] 06/19/2017 3:03 PM

## 2017-05-30 ENCOUNTER — Ambulatory Visit (INDEPENDENT_AMBULATORY_CARE_PROVIDER_SITE_OTHER): Payer: Medicare Other | Admitting: Allergy & Immunology

## 2017-05-30 ENCOUNTER — Encounter: Payer: Self-pay | Admitting: Allergy & Immunology

## 2017-05-30 VITALS — BP 124/76 | HR 55 | Ht 63.0 in | Wt 140.0 lb

## 2017-05-30 DIAGNOSIS — Z880 Allergy status to penicillin: Secondary | ICD-10-CM | POA: Diagnosis not present

## 2017-05-30 DIAGNOSIS — J4520 Mild intermittent asthma, uncomplicated: Secondary | ICD-10-CM | POA: Diagnosis not present

## 2017-05-30 DIAGNOSIS — J302 Other seasonal allergic rhinitis: Secondary | ICD-10-CM

## 2017-05-30 NOTE — Progress Notes (Addendum)
NEW PATIENT  Date of Service/Encounter:  05/30/17  Referring provider: Seward Carol, MD   Hematologist/Oncologist: Dr. Sarajane Jews Magrinat   Assessment:   Penicillin allergy  Seasonal allergies (well controlled)  Intermittent asthma, uncomplicated  Plan/Recommendations:   1. Penicillin allergy  - You tolerate the penicillin challenge today. - We will remove this from your allergy list. - You are free to use this class of medication in the future. - Please tell your daughter I said hello.  2. Return if symptoms worsen or fail to improve.  Subjective:   [name] is a 72 y.o. female presenting today for evaluation of  Chief Complaint  Patient presents with  . Allergy Testing    Pt presents to discuss possible PCN allergy.    [name] has a history of the following: Patient Active Problem List   Diagnosis Date Noted  . CAD (coronary artery disease) 02/11/2017  . Status post coronary artery stent placement   . Non-ST elevation (NSTEMI) myocardial infarction ([place])   . Depression with anxiety 12/31/2016  . Chest pain 12/31/2016  . Hyponatremia 12/31/2016  . Chronic pain 12/31/2016  . Essential hypertension 12/31/2016  . Unstable angina ([place])   . Family history of breast cancer in female 02/08/2016  . Rash and nonspecific skin eruption 10/03/2015  . 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 Malignant neoplasm of upper-outer quadrant of right breast in female, estrogen receptor positive ([place]) 09/01/2015  . 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 Asthma 09/12/2011  . Menopausal symptoms   . Chronic vaginitis   . Fibromyalgia   . Interstitial cystitis   . UNSPECIFIED HYPOTHYROIDISM 06/14/2009  . DEPRESSIVE DISORDER NOT ELSEWHERE CLASSIFIED 06/14/2009  . Hereditary and idiopathic peripheral neuropathy 06/14/2009  . UNSPEC HEMORRHOIDS WITHOUT MENTION COMPLICATION 53/29/9242  . ALLERGIC RHINITIS CAUSE UNSPECIFIED 06/14/2009    . ESOPHAGEAL REFLUX 06/14/2009  . Irritable bowel syndrome 06/14/2009  . INSOMNIA UNSPECIFIED 06/14/2009  . Osteoporosis 08/27/2007    History obtained from: chart review and patient.  [name] was referred by Seward Carol, MD.     [name] is a 72 y.o. femalewith a rather complicated past medical history presenting for evaluation of penicillin allergy. When she was aged 38, she had penicillin as a child. At age 48, she developed hives and a yeast infection after getting penicillin. The hives were more of an issue than the yeast. She does not remember the exact time course in relation to her penicillin. In the meantime, she has had 15 major surgeries, including 13 dental implants and breast cancer. She had had an MI with 4 stents placed as well. She has fibromyalgia, but this is stable. She has had antibiotics for all of the dental surgeries. Her Infectious Disease daughter recommended that she get penicillin testing and challenge. She is likely going to have additional surgeries in the future and hence additional need to antibiotics, therefore she would like to get this off of her list in order to open up a new class of antibiotics for her use.   She did see Dr. Shaune Leeks, who did all of the testing. She did get years of allergy shots, which did not help at all. She now has seasonal allergies, but nothing debilitating. She does have a diagnosis of asthma and is on albuterol as needed. She reports that she needs this only occasionally. She does not cough at night and only needs prednisone intermittently. She last got prednisone and antibiotics two  months ago. She last had prednisone prior to that in August 2017 as part of her chemotherapy (breast cancer). She is not interested in doing additional testing for environmental allergies at this time and she thinks that her asthma is under good control.   Her husband specializes in bankruptcy and does general practice with estate work, wills, Social research officer, government. 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 She did this for 15 years until taking time off during her husband's treatment for lymphoma. Her husband is s/p two bone marrow transplants and actually doing quite well, all things considered.    Otherwise, there is no history of other atopic diseases, including food allergies, stinging insect allergies, or urticaria. There is no significant infectious history. Vaccinations are up to date.    Past Medical History: Patient Active Problem List   Diagnosis Date Noted  . CAD (coronary artery disease) 02/11/2017  . Status post coronary artery stent placement   . Non-ST elevation (NSTEMI) myocardial infarction ([place])   . Depression with anxiety 12/31/2016  . Chest pain 12/31/2016  . Hyponatremia 12/31/2016  . Chronic pain 12/31/2016  . Essential hypertension 12/31/2016  . Unstable angina ([place])   . Family history of breast cancer in female 02/08/2016  . Rash and nonspecific skin eruption 10/03/2015  . 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 Malignant neoplasm of upper-outer quadrant of right breast in female, estrogen receptor positive ([place]) 09/01/2015  . 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 Asthma 09/12/2011  . Menopausal symptoms   . Chronic vaginitis   . Fibromyalgia   . Interstitial cystitis   . UNSPECIFIED HYPOTHYROIDISM 06/14/2009  . DEPRESSIVE DISORDER NOT ELSEWHERE CLASSIFIED 06/14/2009  . Hereditary and idiopathic peripheral neuropathy 06/14/2009  . UNSPEC HEMORRHOIDS WITHOUT MENTION COMPLICATION 43/15/4008  . ALLERGIC RHINITIS CAUSE UNSPECIFIED 06/14/2009  . ESOPHAGEAL REFLUX 06/14/2009  . Irritable bowel syndrome 06/14/2009  . INSOMNIA UNSPECIFIED 06/14/2009  . Osteoporosis 08/27/2007    Medication List:  Allergies as of 05/30/2017      Reactions   Penicillins Other (See Comments)   Has patient  had a PCN reaction causing immediate rash, facial/tongue/throat swelling, SOB or lightheadedness with hypotension: No Has patient had a PCN reaction causing severe rash involving mucus membranes or skin necrosis: No Has patient had a PCN reaction that required hospitalization No Has patient had a PCN reaction occurring within the last 10 years: No If all of the above answers are "NO", then may proceed with Cephalosporin use.   Chlorhexidine Other (See Comments)   [place] IS OK. The CLOTH WIPE causes reaction. Causes itching all over the area   Other Rash   Adhesive tape      Medication List        Accurate as of 05/30/17  4:57 PM. Always use your most recent med list.          albuterol 108 (90 Base) MCG/ACT inhaler Commonly known as:  PROVENTIL HFA;VENTOLIN HFA Inhale 1-2 puffs into the lungs every 6 (six) hours as needed for wheezing or shortness of breath. Started in 1998   anastrozole 1 MG tablet Commonly known as:  ARIMIDEX Take 1 tablet (1 mg total) by mouth daily.   aspirin 81 MG EC tablet Take 1 tablet (81 mg total) by mouth daily.   buPROPion 300 MG 24 hr tablet Commonly known as:  WELLBUTRIN XL Take 300 mg  by mouth daily.   CALCIUM + D PO Take 1 tablet by mouth at bedtime. Started approximately 1998   carisoprodol 350 MG tablet Commonly known as:  SOMA Take 350 mg by mouth 2 (two) times daily as needed for muscle spasms. Started in 2008.   ezetimibe 10 MG tablet Commonly known as:  ZETIA Take 1 tablet (10 mg total) by mouth daily.   furosemide 20 MG tablet Commonly known as:  LASIX Take 1 tablet (20 mg total) by mouth daily as needed (shortness of breath).   HYDROcodone-acetaminophen 10-325 MG tablet Commonly known as:  NORCO Take 1 tablet by mouth every 4 (four) hours as needed for moderate pain or severe pain (as needed for fibromyalgia and IC).   hydrOXYzine 25 MG capsule Commonly known as:  VISTARIL Take 25 mg by mouth 3 times/day as needed-between  meals & bedtime.   levothyroxine 112 MCG tablet Commonly known as:  SYNTHROID, LEVOTHROID Take 112 mcg by mouth daily.   LORazepam 2 MG tablet Commonly known as:  ATIVAN Take 1 tablet by mouth daily as needed for anxiety.   metoprolol succinate 25 MG 24 hr tablet Commonly known as:  TOPROL-XL Take 0.5 tablets (12.5 mg total) by mouth daily.   minoxidil 2 % external solution Commonly known as:  ROGAINE Apply topically 2 (two) times daily.   multivitamin capsule Take 1 capsule by mouth daily. Taking since at least 1988.   nitroGLYCERIN 0.4 MG SL tablet Commonly known as:  NITROSTAT Place 1 tablet (0.4 mg total) under the tongue every 5 (five) minutes as needed for chest pain.   NONFORMULARY OR COMPOUNDED ITEM Place 1 suppository vaginally daily. compounded medication - vitamin E   ondansetron 4 MG tablet Commonly known as:  ZOFRAN Take 1 tablet by mouth daily as needed for nausea or vomiting.   pantoprazole 40 MG tablet Commonly known as:  PROTONIX Take 40 mg by mouth 2 (two) times daily. Started in 2012.   potassium chloride 10 MEQ tablet Commonly known as:  K-DUR TAKE 1 TABLET BY MOUTH AS NEEDED. TAKE WITH LASIX   prochlorperazine 10 MG tablet Commonly known as:  COMPAZINE Take 1 tablet (10 mg total) every 6 (six) hours as needed by mouth (Nausea or vomiting).   terconazole 0.4 % vaginal cream Commonly known as:  TERAZOL 7 Place 1 applicator vaginally 2 (two) times daily as needed (irritation).   ticagrelor 90 MG Tabs tablet Commonly known as:  BRILINTA Take 1 tablet (90 mg total) by mouth 2 (two) times daily.   VALIUM 5 MG tablet Generic drug:  diazepam Take 5 mg by mouth every 6 (six) hours as needed for muscle spasms (interstitial cystitis). Started approximately 2008.   Vitamin D (Ergocalciferol) 50000 units Caps capsule Commonly known as:  DRISDOL Take 1 capsule (50,000 Units total) by mouth every 7 (seven) days.   zolpidem 10 MG tablet Commonly known  as:  AMBIEN Take 10 mg by mouth at bedtime as needed for sleep.       Birth History: non-contributory.   Developmental History: non-contributory.   Past Surgical History: Past Surgical History:  Procedure Laterality Date  . BLADDER HYPEREXTENSION     X4, prior to 01/2006  . BLADDER SUSPENSION    . BREAST BIOPSY    . BREAST LUMPECTOMY Right    2017  . BREAST LUMPECTOMY WITH NEEDLE LOCALIZATION AND AXILLARY LYMPH NODE DISSECTION Right 12/05/2015   Procedure: RIGHT BREAST NEEDLE LOCALIZED (WIRES X 2) LUMPECTOMY WITH AXILLARY LYMPH  NODE DISSECTION;  Surgeon: Fanny Skates, MD;  Location: [facility];  Service: General;  Laterality: Right;  . BREAST LUMPECTOMY WITH RADIOACTIVE SEED LOCALIZATION Left 12/05/2015   Procedure: RADIOACTIVE SEED GUIDED LEFT BREAST LUMPECTOMY;  Surgeon: Fanny Skates, MD;  Location: [facility];  Service: General;  Laterality: Left;  . BREAST SURGERY    . BUNIONECTOMY     prior to 01/2006  . CARDIAC CATHETERIZATION    . CESAREAN SECTION  1979  . CHOLECYSTECTOMY  02/08/2006   DR. INGRAM  . COLONOSCOPY    . CORONARY STENT INTERVENTION N/A 01/01/2017   Procedure: CORONARY STENT INTERVENTION;  Surgeon: Jettie Booze, MD;  Location: [facility];  Service: Cardiovascular;  Laterality: N/A;  . DENTAL SURGERY  2009-2016   13 dental implants  . LEFT HEART CATH AND CORONARY ANGIOGRAPHY N/A 01/01/2017   Procedure: LEFT HEART CATH AND CORONARY ANGIOGRAPHY;  Surgeon: Jettie Booze, MD;  Location: [facility];  Service: Cardiovascular;  Laterality: N/A;  . LEG SURGERY  07/06/2004   Broken tibia  . NASAL SINUS SURGERY     X4; prior to 01/2006  . PORT-A-CATH REMOVAL Right 12/05/2015   Procedure: REMOVAL PORT-A-CATH;  Surgeon: Fanny Skates, MD;  Location: [facility];  Service: General;  Laterality: Right;  . PORTACATH PLACEMENT Left 09/08/2015   Procedure: INSERTION OF PORT-A-CATH ;  Surgeon: Fanny Skates, MD;  Location: [facility] OR;  Service: General;  Laterality:  Left;     Family History: Family History  Problem Relation Age of Onset  . Hypertension Mother   . Heart disease Mother        d. 70  . Parkinsonism Father        d. 68  . Heart disease Sister   . Breast cancer Sister 55       s/p mastectomy; reportedly negative BRCA1/2 testing approx 7 years ago  . Melanoma Sister        d. 61s; hx of incresed sun exposure  . Cancer Sister        dx. sarcoma of her face in her 38s; treated w/ MOHS surgery  . Parkinsonism Paternal Aunt        d. 90s  . Parkinsonism Paternal Uncle        d. late 33s  . Kidney failure Maternal Grandmother 14  . Heart attack Maternal Grandfather        d. 2s  . Heart disease Paternal Grandmother        d. 60  . Breast cancer Other 68       maternal great aunt (MGF's sister)  . Kidney cancer Paternal Uncle 77       d. 12s; unsure about smoking status     Social History: Jazmene lives at home with her husband. She has two daughters, both of whom are grown and living on their own. 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. There are dust mite coverings on the bed, but not the pillows. THey have two beagles and one cat in the home. There is no tobacco exposure. She works as an Psychiatrist currently.    Review of Systems: a 14-point review of systems is pertinent for what is mentioned in HPI.  Otherwise, all other systems were negative. Constitutional: negative other than that listed in the HPI Eyes: negative other than that listed in the HPI Ears, nose, mouth, throat, and face: negative  other than that listed in the HPI Respiratory: negative other than that listed in the HPI Cardiovascular: negative other than that listed in the HPI Gastrointestinal: negative other than that listed in the HPI Genitourinary: negative other than that listed in the HPI Integument: negative other than that listed in the HPI Hematologic: negative other than  that listed in the HPI Musculoskeletal: negative other than that listed in the HPI Neurological: negative other than that listed in the HPI Allergy/Immunologic: negative other than that listed in the HPI    Objective:   Blood pressure 124/76, pulse (!) 55, height _0  (1.6 m), weight 140 lb (63.5 kg), last menstrual period 04/30/1996, SpO2 96 %. Body mass index is 24.8 kg/m.   Physical Exam:  General: Alert, interactive, in no acute distress. Pleasant female. Talkative.  Eyes: No conjunctival injection bilaterally, no discharge on the right, no discharge on the left and no Horner-Trantas dots present. PERRL bilaterally. EOMI without pain. No photophobia.  Ears: Right TM pearly gray with normal light reflex, Left TM pearly gray with normal light reflex, Right TM intact without perforation and Left TM intact without perforation.  Nose/Throat: External nose within normal limits and septum midline. Turbinates edematous without discharge. Posterior oropharynx mildly erythematous without cobblestoning in the posterior oropharynx. Tonsils 2+ without exudates.  Tongue without thrush. Neck: Supple without thyromegaly. Trachea midline. Adenopathy: no enlarged lymph nodes appreciated in the anterior cervical, occipital, axillary, epitrochlear, inguinal, or popliteal regions. Lungs: Clear to auscultation without wheezing, rhonchi or rales. No increased work of breathing. CV: Normal S1/S2. No murmurs. Capillary refill <2 seconds.  Abdomen: Nondistended, nontender. No guarding or rebound tenderness. Bowel sounds present in all fields and hypoactive  Skin: Warm and dry, without lesions or rashes. Extremities:  No clubbing, cyanosis or edema. Neuro:   Grossly intact. No focal deficits appreciated. Responsive to questions.  Diagnostic studies:   Skin prick testing with penicillin and derivatives: negative with adequate controls  Intradermal testing with penicillin and derivatives: negative with  adequate controls  Patient did tolerate a penicillin challenge well. She received 22m and was monitored for 30 minutes, and then she received 4552mand was monitored for one hour. She displayed no symptoms of anaphylaxis. Penicillin was removed from her allergy list.    JoSalvatore MarvelMD Allergy [facility]Unityf [facility]

## 2017-05-30 NOTE — Patient Instructions (Addendum)
1. Penicillin allergy - You tolerate the penicillin challenge today. - We will remove this from your allergy list. - You are free to use this class of medication in the future. - Please tell your daughter I said hello!   2. Return if symptoms worsen or fail to improve.   Please inform us of any Emergency Department visits, hospitalizations, or changes in symptoms. Call us before going to the ED for breathing or allergy symptoms since we might be able to fit you in for a sick visit. Feel free to contact us anytime with any questions, problems, or concerns.  It was a pleasure to meet you today! Happy New Year!   Websites that have reliable patient information: 1. American Academy of Asthma, Allergy, and Immunology: www.aaaai.org 2. Food Allergy Research and Education (FARE): foodallergy.org 3. Mothers of Asthmatics: http://www.asthmacommunitynetwork.org 4. American College of Allergy, Asthma, and Immunology: www.acaai.org

## 2017-05-31 ENCOUNTER — Encounter (HOSPITAL_COMMUNITY): Payer: Medicare Other

## 2017-05-31 ENCOUNTER — Encounter (HOSPITAL_COMMUNITY)
Admission: RE | Admit: 2017-05-31 | Discharge: 2017-05-31 | Disposition: A | Discharge status: Home / Self Care | Payer: Medicare Other | Source: Ambulatory Visit | Attending: Interventional Cardiology | Admitting: Interventional Cardiology

## 2017-05-31 DIAGNOSIS — I214 Non-ST elevation (NSTEMI) myocardial infarction: Secondary | ICD-10-CM | POA: Insufficient documentation

## 2017-05-31 DIAGNOSIS — Z955 Presence of coronary angioplasty implant and graft: Secondary | ICD-10-CM

## 2017-05-31 NOTE — Progress Notes (Signed)
[name] 72 y.o. female DOB: Oct 13, 1945 MRN: [id number]      Nutrition Note  1. NSTEMI (non-ST elevated myocardial infarction) ([place]) 12/31/16   2. S/P coronary artery stent placement 01/01/17     Labs:  Lab Results  Component Value Date   HGBA1C 5.2 05/29/2017   Nutrition Note Spoke with pt. Nutrition plan and survey reviewed with pt. Pt is following a heart healthy diet. Pt wt reportedly 62.1 kg (136.6 lb) today, which is down 2.4 lb over the past 6 weeks. Pt expressed understanding of the information reviewed. Pt aware of nutrition education classes offered.  Nutrition Diagnosis ? Food-and nutrition-related knowledge deficit related to lack of exposure to information as related to diagnosis of: ? CVD  Nutrition Intervention ? Pt's individual nutrition plan and goals reviewed with pt. ? Benefits of adopting Heart Healthy diet discussed when Medficts reviewed.  Nutrition Goal(s):  ? Pt to identify food quantities necessary to achieve weight loss of 5-8 lb at graduation from cardiac rehab. Goal wt of 130 lb desired.  - working toward  Plan:  Pt to attend nutrition classes ? Nutrition I ? Nutrition II ? Portion Distortion  Will provide client-centered nutrition education as part of interdisciplinary care.   Monitor and evaluate progress toward nutrition goal with team.  Derek Mound, M.Ed, RD, LDN, CDE 05/31/2017 12:22 PM

## 2017-06-03 ENCOUNTER — Encounter (HOSPITAL_COMMUNITY): Payer: Medicare Other

## 2017-06-04 ENCOUNTER — Encounter: Payer: Self-pay | Admitting: *Deleted

## 2017-06-05 ENCOUNTER — Encounter (HOSPITAL_COMMUNITY): Payer: Medicare Other

## 2017-06-07 ENCOUNTER — Encounter (HOSPITAL_COMMUNITY): Payer: Medicare Other

## 2017-06-10 ENCOUNTER — Encounter (HOSPITAL_COMMUNITY): Payer: Medicare Other

## 2017-06-10 ENCOUNTER — Encounter (HOSPITAL_COMMUNITY)
Admission: RE | Admit: 2017-06-10 | Discharge: 2017-06-10 | Disposition: A | Discharge status: Home / Self Care | Payer: Medicare Other | Source: Ambulatory Visit | Attending: Interventional Cardiology | Admitting: Interventional Cardiology

## 2017-06-10 DIAGNOSIS — I214 Non-ST elevation (NSTEMI) myocardial infarction: Secondary | ICD-10-CM | POA: Diagnosis not present

## 2017-06-10 DIAGNOSIS — Z955 Presence of coronary angioplasty implant and graft: Secondary | ICD-10-CM

## 2017-06-12 ENCOUNTER — Encounter (HOSPITAL_COMMUNITY): Payer: Medicare Other

## 2017-06-12 ENCOUNTER — Encounter (HOSPITAL_COMMUNITY)
Admission: RE | Admit: 2017-06-12 | Discharge: 2017-06-12 | Disposition: A | Discharge status: Home / Self Care | Payer: Medicare Other | Source: Ambulatory Visit | Attending: Interventional Cardiology | Admitting: Interventional Cardiology

## 2017-06-12 DIAGNOSIS — I214 Non-ST elevation (NSTEMI) myocardial infarction: Secondary | ICD-10-CM | POA: Diagnosis not present

## 2017-06-13 ENCOUNTER — Other Ambulatory Visit: Payer: Self-pay

## 2017-06-13 DIAGNOSIS — Z170 Estrogen receptor positive status [ER+]: Principal | ICD-10-CM

## 2017-06-13 DIAGNOSIS — C50411 Malignant neoplasm of upper-outer quadrant of right female breast: Secondary | ICD-10-CM

## 2017-06-13 MED ORDER — PROCHLORPERAZINE MALEATE 10 MG PO TABS: 10.0000 mg | ORAL_TABLET | Freq: Four times a day (QID) | ORAL | 1 refills | Status: DC | PRN

## 2017-06-13 NOTE — Telephone Encounter (Signed)
Received VM from pt regarding a refill for Anastrozole and Compazine.  Called pt back and noted Anastrozole already has refills and Compazine refill has been completed at this time, informed pt. No other needs at this time per pt.

## 2017-06-14 ENCOUNTER — Encounter (HOSPITAL_COMMUNITY): Payer: Medicare Other

## 2017-06-14 ENCOUNTER — Encounter (HOSPITAL_COMMUNITY)
Admission: RE | Admit: 2017-06-14 | Discharge: 2017-06-14 | Disposition: A | Discharge status: Home / Self Care | Payer: Medicare Other | Source: Ambulatory Visit | Attending: Interventional Cardiology | Admitting: Interventional Cardiology

## 2017-06-14 DIAGNOSIS — Z955 Presence of coronary angioplasty implant and graft: Secondary | ICD-10-CM

## 2017-06-14 DIAGNOSIS — I214 Non-ST elevation (NSTEMI) myocardial infarction: Secondary | ICD-10-CM | POA: Diagnosis not present

## 2017-06-17 ENCOUNTER — Encounter (HOSPITAL_COMMUNITY): Payer: Medicare Other

## 2017-06-17 ENCOUNTER — Encounter (HOSPITAL_COMMUNITY)
Admission: RE | Admit: 2017-06-17 | Discharge: 2017-06-17 | Disposition: A | Discharge status: Home / Self Care | Payer: Medicare Other | Source: Ambulatory Visit | Attending: Interventional Cardiology | Admitting: Interventional Cardiology

## 2017-06-17 DIAGNOSIS — Z955 Presence of coronary angioplasty implant and graft: Secondary | ICD-10-CM

## 2017-06-17 DIAGNOSIS — I214 Non-ST elevation (NSTEMI) myocardial infarction: Secondary | ICD-10-CM | POA: Diagnosis not present

## 2017-06-18 ENCOUNTER — Other Ambulatory Visit: Payer: Self-pay | Admitting: *Deleted

## 2017-06-19 ENCOUNTER — Encounter (HOSPITAL_COMMUNITY): Payer: Medicare Other

## 2017-06-19 NOTE — Progress Notes (Signed)
Cardiac Individual Treatment Plan  Patient Details  Name: [name] MRN: [id number] Date of Birth: Oct 13, 1945 Referring Provider:     [place] from 03/28/2017 in [place]  Referring Provider  Daneen Schick MD      Initial Encounter Date:    CARDIAC REHAB PHASE II ORIENTATION from 03/28/2017 in [place]  Date  03/28/17  Referring Provider  Daneen Schick MD      Visit Diagnosis: NSTEMI (non-ST elevated myocardial infarction) ([place]) 12/31/16  S/P coronary artery stent placement 01/01/17  Patient's Home Medications on Admission:  Current Outpatient Medications:  .  albuterol (PROVENTIL HFA;VENTOLIN HFA) 108 (90 Base) MCG/ACT inhaler, Inhale 1-2 puffs into the lungs every 6 (six) hours as needed for wheezing or shortness of breath. Started in 1998, Disp: , Rfl:  .  anastrozole (ARIMIDEX) 1 MG tablet, Take 1 tablet (1 mg total) by mouth daily., Disp: 90 tablet, Rfl: 4 .  aspirin EC 81 MG EC tablet, Take 1 tablet (81 mg total) by mouth daily., Disp: 30 tablet, Rfl: 0 .  buPROPion (WELLBUTRIN XL) 300 MG 24 hr tablet, Take 300 mg by mouth daily. , Disp: , Rfl:  .  Calcium Carbonate-Vitamin D (CALCIUM + D PO), Take 1 tablet by mouth at bedtime. Started approximately 1998, Disp: , Rfl:  .  carisoprodol (SOMA) 350 MG tablet, Take 350 mg by mouth 2 (two) times daily as needed for muscle spasms. Started in 2008., Disp: , Rfl: 2 .  diazepam (VALIUM) 5 MG tablet, Take 5 mg by mouth every 6 (six) hours as needed for muscle spasms (interstitial cystitis). Started approximately 2008., Disp: , Rfl:  .  ezetimibe (ZETIA) 10 MG tablet, Take 1 tablet (10 mg total) by mouth daily., Disp: 30 tablet, Rfl: 11 .  furosemide (LASIX) 20 MG tablet, Take 1 tablet (20 mg total) by mouth daily as needed (shortness of breath)., Disp: 30 tablet, Rfl: 3 .  HYDROcodone-acetaminophen (NORCO) 10-325 MG tablet, Take 1 tablet by  mouth every 4 (four) hours as needed for moderate pain or severe pain (as needed for fibromyalgia and IC). , Disp: , Rfl:  .  hydrOXYzine (VISTARIL) 25 MG capsule, Take 25 mg by mouth 3 times/day as needed-between meals & bedtime. , Disp: , Rfl:  .  levothyroxine (SYNTHROID, LEVOTHROID) 112 MCG tablet, Take 112 mcg by mouth daily., Disp: , Rfl: 1 .  LORazepam (ATIVAN) 2 MG tablet, Take 1 tablet by mouth daily as needed for anxiety. , Disp: , Rfl: 5 .  metoprolol succinate (TOPROL-XL) 25 MG 24 hr tablet, Take 0.5 tablets (12.5 mg total) by mouth daily., Disp: 15 tablet, Rfl: 6 .  minoxidil (ROGAINE) 2 % external solution, Apply topically 2 (two) times daily., Disp: , Rfl:  .  Multiple Vitamin (MULTIVITAMIN) capsule, Take 1 capsule by mouth daily. Taking since at least 1988., Disp: , Rfl:  .  nitroGLYCERIN (NITROSTAT) 0.4 MG SL tablet, Place 1 tablet (0.4 mg total) under the tongue every 5 (five) minutes as needed for chest pain., Disp: 25 tablet, Rfl: 3 .  NONFORMULARY OR COMPOUNDED ITEM, Place 1 suppository vaginally daily. compounded medication - vitamin E, Disp: , Rfl:  .  ondansetron (ZOFRAN) 4 MG tablet, Take 1 tablet by mouth daily as needed for nausea or vomiting. , Disp: , Rfl: 3 .  pantoprazole (PROTONIX) 40 MG tablet, Take 40 mg by mouth 2 (two) times daily. Started in 2012., Disp: ,  Rfl:  .  potassium chloride (K-DUR) 10 MEQ tablet, TAKE 1 TABLET BY MOUTH AS NEEDED. TAKE WITH LASIX, Disp: 30 tablet, Rfl: 11 .  prochlorperazine (COMPAZINE) 10 MG tablet, Take 1 tablet (10 mg total) by mouth every 6 (six) hours as needed (Nausea or vomiting)., Disp: 30 tablet, Rfl: 1 .  terconazole (TERAZOL 7) 0.4 % vaginal cream, Place 1 applicator vaginally 2 (two) times daily as needed (irritation). , Disp: , Rfl:  .  ticagrelor (BRILINTA) 90 MG TABS tablet, Take 1 tablet (90 mg total) by mouth 2 (two) times daily., Disp: 60 tablet, Rfl: 0 .  Vitamin D, Ergocalciferol, (DRISDOL) 50000 units CAPS capsule,  Take 1 capsule (50,000 Units total) by mouth every 7 (seven) days., Disp: 12 capsule, Rfl: 3 .  zolpidem (AMBIEN) 10 MG tablet, Take 10 mg by mouth at bedtime as needed for sleep. , Disp: , Rfl:   Past Medical History: Past Medical History:  Diagnosis Date  . Anxiety   . Arthritis 2008  . Asthma 1953   age 46  . Breast cancer ([place])   . Cancer [facility])    breast cancer  . Chronic vaginitis 1998  . Fibromyalgia 1982  . GERD (gastroesophageal reflux disease)    since at least 2011  . Hemorrhoids 2009  . History of radiation therapy 01/18/16-03/05/16   right breast/nodal areas 45 Gy, right breast boost 16 Gy  . Hypoglycemia 2003  . Hypothyroidism 1964   age 82  . Interstitial cystitis 1998  . Irritable bowel syndrome 06/14/2009   Qualifier: Diagnosis of  By: Patsy Baltimore RN, Langley Gauss    . Menopausal symptoms   . Osteoporosis 12/24/2006  . Personal history of chemotherapy   . Personal history of radiation therapy   . Pneumonia 1979   varicella  . PONV (postoperative nausea and vomiting) 1979    Tobacco Use: Social History   Tobacco Use  Smoking Status Former Smoker  . Packs/day: 0.25  . Years: 15.00  . Pack years: 3.75  . Types: Cigarettes  Smokeless Tobacco Never Used  Tobacco Comment   social smoker/"on and off"    Labs: Recent Review Flowsheet Data    Labs for ITP Cardiac and Pulmonary Rehab Latest Ref Rng & Units 07/05/2016 11/28/2016 01/02/2017 01/03/2017 05/29/2017   Cholestrol 0 - 200 mg/dL - - 224(H) 189 -   LDLCALC 0 - 99 mg/dL - - 128(H) 105(H) -   HDL >40 mg/dL - - 77 67 -   Trlycerides <150 mg/dL - - 93 83 -   Hemoglobin A1c 4.8 - 5.6 % 5.2 5.2 - 4.9 5.2      Capillary Blood Glucose: No results found for: GLUCAP   Exercise Target Goals:    Exercise Program Goal: Individual exercise prescription set using results from initial 6 min walk test and THRR while considering  patient's activity barriers and safety.   Exercise Prescription Goal: Initial exercise  prescription builds to 30-45 minutes a day of aerobic activity, 2-3 days per week.  Home exercise guidelines will be given to patient during program as part of exercise prescription that the participant will acknowledge.  Activity Barriers & Risk Stratification: Activity Barriers & Cardiac Risk Stratification - 03/28/17 1424      Activity Barriers & Cardiac Risk Stratification   Activity Barriers  Fibromyalgia;Deconditioning;Back Problems;Other (comment);Joint Problems    Comments  Right Hip Pain    Cardiac Risk Stratification  Moderate       6 Minute Walk: 6 Minute Walk  Row Name 03/28/17 1438         6 Minute Walk   Phase  Initial     Distance  1710 feet     Walk Time  6 minutes     # of Rest Breaks  0     MPH  3.23     METS  3.59     RPE  12     Perceived Dyspnea   1     VO2 Peak  12.58     Symptoms  Yes (comment)     Comments  Mild SOB      Resting HR  62 bpm     Resting BP  122/70     Resting Oxygen Saturation   97 %     Exercise Oxygen Saturation  during 6 min walk  96 %     Max Ex. HR  100 bpm     Max Ex. BP  130/78     2 Minute Post BP  112/70        Oxygen Initial Assessment:   Oxygen Re-Evaluation:   Oxygen Discharge (Final Oxygen Re-Evaluation):   Initial Exercise Prescription: Initial Exercise Prescription - 03/28/17 1400      Date of Initial Exercise RX and Referring Provider   Date  03/28/17    Referring Provider  Daneen Schick MD      Recumbant Bike   Level  2    Minutes  15    METs  2      NuStep   Level  3    SPM  70    Minutes  15    METs  2.5      Track   Laps  13    Minutes  15    METs  2.51      Prescription Details   Frequency (times per week)  3    Duration  Progress to 30 minutes of continuous aerobic without signs/symptoms of physical distress      Intensity   THRR 40-80% of Max Heartrate  57-114    Ratings of Perceived Exertion  11-13    Perceived Dyspnea  0-4      Progression   Progression  Continue to  progress workloads to maintain intensity without signs/symptoms of physical distress.      Resistance Training   Training Prescription  Yes    Weight  3lbs    Reps  10-15       Perform Capillary Blood Glucose checks as needed.  Exercise Prescription Changes:  Exercise Prescription Changes    Row Name 04/03/17 1343 04/15/17 1346 05/20/17 1505 06/10/17 1600 06/17/17 1600     Response to Exercise   Blood Pressure (Admit)  110/76  126/60  122/64  118/62  110/60   Blood Pressure (Exercise)  138/70  142/64  136/78  128/70  148/72   Blood Pressure (Exit)  119/65  112/78  122/70  120/70  120/64   Heart Rate (Admit)  67 bpm  84 bpm  65 bpm  68 bpm  66 bpm   Heart Rate (Exercise)  90 bpm  93 bpm  89 bpm  94 bpm  94 bpm   Heart Rate (Exit)  67 bpm  53 bpm  77 bpm  67 bpm  70 bpm   Rating of Perceived Exertion (Exercise)  12  14  14  14  13    Symptoms  none  mild low back pain/sciatica  -  -  -  Comments  pt oriented to exercise equipment  -  -  -  -   Duration  Continue with 30 min of aerobic exercise without signs/symptoms of physical distress.  Continue with 30 min of aerobic exercise without signs/symptoms of physical distress.  Continue with 30 min of aerobic exercise without signs/symptoms of physical distress.  Continue with 30 min of aerobic exercise without signs/symptoms of physical distress.  Continue with 30 min of aerobic exercise without signs/symptoms of physical distress.   Intensity  THRR unchanged  THRR unchanged  THRR unchanged  THRR unchanged  THRR unchanged     Progression   Progression  Continue to progress workloads to maintain intensity without signs/symptoms of physical distress.  Continue to progress workloads to maintain intensity without signs/symptoms of physical distress.  Continue to progress workloads to maintain intensity without signs/symptoms of physical distress.  Continue to progress workloads to maintain intensity without signs/symptoms of physical distress.   Continue to progress workloads to maintain intensity without signs/symptoms of physical distress.   Average METs  2.7  2.7  2.9  2.95  3.1     Resistance Training   Training Prescription  No relaxation day  Yes  Yes  Yes  Yes   Weight  -  3lbs  3lbs  3lbs  4lbs   Reps  -  10-15  10-15  10-15  10-15   Time  -  10 Minutes  10 Minutes  10 Minutes  10 Minutes     Interval Training   Interval Training  -  -  No  No  No     Recumbant Bike   Level  2  2  2   2.8  2.8   Minutes  15  10  10  10  10    METs  2.7  2.5  2.4  2.5  2.7     NuStep   Level  3  3  3  4  4    SPM  70  70  70  75  75   Minutes  15  10  10  10  10    METs  2.7  2.6  3.1  3.1  3.5     Track   Laps  -  12  12  12  12    Minutes  -  10  10  10  10    METs  -  3.09  3.09  3.26  3.09     Home Exercise Plan   Plans to continue exercise at  -  -  -  Home (comment) Walking  Home (comment) Walking   Frequency  -  -  -  Add 2 additional days to program exercise sessions.  Add 2 additional days to program exercise sessions.   Initial Home Exercises Provided  -  -  -  04/26/17  04/26/17      Exercise Comments:  Exercise Comments    Row Name 04/16/17 1356 04/26/17 1627 05/17/17 1512 05/20/17 1514 06/17/17 1630   Exercise Comments  Reviewed METs and activity levels with patient. Pt is doing well with current exercise prescription and will progress workloads based on signs of physical distress, HR, BP and RPE.  Reviewed Home Exercise Program with patient. Patient will continue to walk 2-3 days a week at home; 30-45 minutes. Will continue to monitor patient.   Pt has returned to cardiac rehab from family trip. Pt has responded to exercise prescription well today, despite being gone for several weeks.  Will continue to monitor and progress pt.   Pt has returned to cardiac rehab from family trip. Pt has responded to exercise prescription well today, despite being gone for several weeks. Will continue to monitor and progress pt.   Reviewed  METs and Goals with pt. Pt is really enjoying cardiac rehab. Will continue to monitor and progress pt.       Exercise Goals and Review:  Exercise Goals    Row Name 03/28/17 1425             Exercise Goals   Increase Physical Activity  Yes       Intervention  Provide advice, education, support and counseling about physical activity/exercise needs.;Develop an individualized exercise prescription for aerobic and resistive training based on initial evaluation findings, risk stratification, comorbidities and participant's personal goals.       Expected Outcomes  Achievement of increased cardiorespiratory fitness and enhanced flexibility, muscular endurance and strength shown through measurements of functional capacity and personal statement of participant.       Increase Strength and Stamina  Yes Improve Muscular Strength, Walking Tolerance, and Motivation to Fitness       Intervention  Provide advice, education, support and counseling about physical activity/exercise needs.;Develop an individualized exercise prescription for aerobic and resistive training based on initial evaluation findings, risk stratification, comorbidities and participant's personal goals.       Expected Outcomes  Achievement of increased cardiorespiratory fitness and enhanced flexibility, muscular endurance and strength shown through measurements of functional capacity and personal statement of participant.       Able to understand and use rate of perceived exertion (RPE) scale  Yes       Intervention  Provide education and explanation on how to use RPE scale       Expected Outcomes  Short Term: Able to use RPE daily in rehab to express subjective intensity level;Long Term:  Able to use RPE to guide intensity level when exercising independently       Knowledge and understanding of Target Heart Rate Range (THRR)  Yes       Intervention  Provide education and explanation of THRR including how the numbers were predicted and  where they are located for reference       Expected Outcomes  Short Term: Able to state/look up THRR;Long Term: Able to use THRR to govern intensity when exercising independently;Short Term: Able to use daily as guideline for intensity in rehab       Able to check pulse independently  Yes       Intervention  Provide education and demonstration on how to check pulse in carotid and radial arteries.;Review the importance of being able to check your own pulse for safety during independent exercise       Expected Outcomes  Short Term: Able to explain why pulse checking is important during independent exercise;Long Term: Able to check pulse independently and accurately       Understanding of Exercise Prescription  Yes       Intervention  Provide education, explanation, and written materials on patient's individual exercise prescription       Expected Outcomes  Short Term: Able to explain program exercise prescription;Long Term: Able to explain home exercise prescription to exercise independently          Exercise Goals Re-Evaluation : Exercise Goals Re-Evaluation    Row Name 04/16/17 1349 05/22/17 1516 05/22/17 1518 05/22/17 1519 06/17/17 1633     Exercise Goal Re-Evaluation   Exercise  Goals Review  Increase Physical Activity;Able to understand and use rate of perceived exertion (RPE) scale;Knowledge and understanding of Target Heart Rate Range (THRR);Understanding of Exercise Prescription;Increase Strength and Stamina;Able to check pulse independently  Increase Physical Activity;Able to understand and use rate of perceived exertion (RPE) scale;Knowledge and understanding of Target Heart Rate Range (THRR);Understanding of Exercise Prescription;Increase Strength and Stamina;Able to check pulse independently  Increase Physical Activity;Able to understand and use rate of perceived exertion (RPE) scale;Knowledge and understanding of Target Heart Rate Range (THRR);Understanding of Exercise Prescription;Increase  Strength and Stamina;Able to check pulse independently  Increase Physical Activity;Able to understand and use rate of perceived exertion (RPE) scale;Knowledge and understanding of Target Heart Rate Range (THRR);Understanding of Exercise Prescription;Increase Strength and Stamina;Able to check pulse independently  Increase Physical Activity;Able to understand and use rate of perceived exertion (RPE) scale;Knowledge and understanding of Target Heart Rate Range (THRR);Understanding of Exercise Prescription;Increase Strength and Stamina;Able to check pulse independently   Comments  Pt is exercising for 30 minutes in cardiac rehab without physical distress. Pt is making great progess and has increased walking tolerance from 7 laps to 13 laps.   -  Pt is   Pt is tolerating exercise prescription well. Will continue to monitor and progres patient.   Pt has been responding well to exericse prescription and is now on a level 4 on NuStep. Pt is now able to use 4lbs handweights.    Expected Outcomes  Pt will continue to improve in cardiorespiratory fitness and be able to exercise with little to no discomfort in back region  Pt will get back to regular exercise routine. Pt will continue to improve cardiovascualr fitness.   Pt will get back to regular exercise routine. Pt will continue to improve cardiovascualr fitness.   Pt will get back to regular exercise routine. Pt will continue to improve cardiovascualr fitness.   Pt will continue to improve cardiovascualr fitness. Pt will also continue to exercise 2 additional days at home.        Discharge Exercise Prescription (Final Exercise Prescription Changes): Exercise Prescription Changes - 06/17/17 1600      Response to Exercise   Blood Pressure (Admit)  110/60    Blood Pressure (Exercise)  148/72    Blood Pressure (Exit)  120/64    Heart Rate (Admit)  66 bpm    Heart Rate (Exercise)  94 bpm    Heart Rate (Exit)  70 bpm    Rating of Perceived Exertion (Exercise)   13    Duration  Continue with 30 min of aerobic exercise without signs/symptoms of physical distress.    Intensity  THRR unchanged      Progression   Progression  Continue to progress workloads to maintain intensity without signs/symptoms of physical distress.    Average METs  3.1      Resistance Training   Training Prescription  Yes    Weight  4lbs    Reps  10-15    Time  10 Minutes      Interval Training   Interval Training  No      Recumbant Bike   Level  2.8    Minutes  10    METs  2.7      NuStep   Level  4    SPM  75    Minutes  10    METs  3.5      Track   Laps  12    Minutes  10  METs  3.09      Home Exercise Plan   Plans to continue exercise at  Home (comment) Walking    Frequency  Add 2 additional days to program exercise sessions.    Initial Home Exercises Provided  04/26/17       Nutrition:  Target Goals: Understanding of nutrition guidelines, daily intake of sodium 1500mg , cholesterol 200mg , calories 30% from fat and 7% or less from saturated fats, daily to have 5 or more servings of fruits and vegetables.  Biometrics: Pre Biometrics - 03/28/17 1437      Pre Biometrics   Height  5' 3.5" (1.613 m)    Weight  139 lb 12.4 oz (63.4 kg)    Waist Circumference  32 inches    Hip Circumference  40 inches    Waist to Hip Ratio  0.8 %    BMI (Calculated)  24.37    Triceps Skinfold  30 mm    % Body Fat  36.9 %    Grip Strength  26 kg    Flexibility  0 in    Single Leg Stand  1.21 seconds        Nutrition Therapy Plan and Nutrition Goals: Nutrition Therapy & Goals - 03/28/17 1535      Nutrition Therapy   Diet  Heart Healthy      Personal Nutrition Goals   Nutrition Goal  Pt to identify food quantities necessary to achieve weight loss of 5-8 lb at graduation from cardiac rehab. Goal wt of 130 lb desired.       Intervention Plan   Intervention  Prescribe, educate and counsel regarding individualized specific dietary modifications aiming  towards targeted core components such as weight, hypertension, lipid management, diabetes, heart failure and other comorbidities.    Expected Outcomes  Short Term Goal: Understand basic principles of dietary content, such as calories, fat, sodium, cholesterol and nutrients.;Long Term Goal: Adherence to prescribed nutrition plan.       Nutrition Assessments: Nutrition Assessments - 03/29/17 0837      MEDFICTS Scores   Pre Score  27       Nutrition Goals Re-Evaluation:   Nutrition Goals Re-Evaluation:   Nutrition Goals Discharge (Final Nutrition Goals Re-Evaluation):   Psychosocial: Target Goals: Acknowledge presence or absence of significant depression and/or stress, maximize coping skills, provide positive support system. Participant is able to verbalize types and ability to use techniques and skills needed for reducing stress and depression.  Initial Review & Psychosocial Screening: Initial Psych Review & Screening - 03/28/17 1704      Initial Review   Current issues with  Current Stress Concerns;None Identified    Source of Stress Concerns  Chronic Illness History of breast cancer      Family Dynamics   Good Support System?  Yes      Barriers   Psychosocial barriers to participate in program  There are no identifiable barriers or psychosocial needs.;The patient should benefit from training in stress management and relaxation.      Screening Interventions   Interventions  To provide support and resources with identified psychosocial needs;Provide feedback about the scores to participant       Quality of Life Scores: Quality of Life - 03/28/17 1632      Quality of Life Scores   Health/Function Pre  28 %    Socioeconomic Pre  30 %    Psych/Spiritual Pre  29.14 %    Family Pre  30 %    GLOBAL  Pre  28.94 %      Scores of 19 and below usually indicate a poorer quality of life in these areas.  A difference of  2-3 points is a clinically meaningful difference.  A  difference of 2-3 points in the total score of the Quality of Life Index has been associated with significant improvement in overall quality of life, self-image, physical symptoms, and general health in studies assessing change in quality of life.  PHQ-9: Recent Review Flowsheet Data    Depression screen [facility] 2/9 04/03/2017 10/15/2016 04/05/2016 12/21/2015   Decreased Interest 0 0 0 0   Down, Depressed, Hopeless 0 0 0 -   PHQ - 2 Score 0 0 0 0     Interpretation of Total Score  Total Score Depression Severity:  1-4 = Minimal depression, 5-9 = Mild depression, 10-14 = Moderate depression, 15-19 = Moderately severe depression, 20-27 = Severe depression   Psychosocial Evaluation and Intervention:   Psychosocial Re-Evaluation: Psychosocial Re-Evaluation    Row Name 04/25/17 1610 05/21/17 1551 06/19/17 0848         Psychosocial Re-Evaluation   Current issues with  Current Stress Concerns  Current Stress Concerns  Current Stress Concerns     Interventions  Encouraged to attend Cardiac Rehabilitation for the exercise;Stress management education  Encouraged to attend Cardiac Rehabilitation for the exercise;Stress management education  Encouraged to attend Cardiac Rehabilitation for the exercise;Stress management education     Continue Psychosocial Services   Follow up required by staff  -  Follow up required by staff     Comments  Emileigh is a breast cancer survivor and has good family support. Will continue to offer emotional support as needed  Mariaguadalupe is a breast cancer survivor and has good family support. Will continue to offer emotional support as needed  Avyana is a breast cancer survivor and has good family support. Will continue to offer emotional support as needed       Initial Review   Source of Stress Concerns  Chronic Illness  Chronic Illness  Chronic Illness        Psychosocial Discharge (Final Psychosocial Re-Evaluation): Psychosocial Re-Evaluation - 06/19/17 0848      Psychosocial  Re-Evaluation   Current issues with  Current Stress Concerns    Interventions  Encouraged to attend Cardiac Rehabilitation for the exercise;Stress management education    Continue Psychosocial Services   Follow up required by staff    Comments  Coralie is a breast cancer survivor and has good family support. Will continue to offer emotional support as needed      Initial Review   Source of Stress Concerns  Chronic Illness       Vocational Rehabilitation: Provide vocational rehab assistance to qualifying candidates.   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       Education: Education Goals: Education classes will be provided on a weekly basis, covering required topics. Participant will state understanding/return demonstration of topics presented.  Learning Barriers/Preferences: Learning Barriers/Preferences - 03/28/17 1422      Learning Barriers/Preferences   Learning Barriers  Sight Cataract Surgery and Readers    Learning Preferences  Audio;Written Material;Video;Verbal Instruction;Skilled Demonstration;Individual Instruction;Pictoral;Computer/Internet;Group Instruction       Education Topics: Count Your Pulse:  -Group instruction provided by verbal instruction, demonstration, patient  participation and written materials to support subject.  Instructors address importance of being able to find your pulse and how to count your pulse when at home without a heart monitor.  Patients get hands on experience counting their pulse with staff help and individually.   Heart Attack, Angina, and Risk Factor Modification:  -Group instruction provided by verbal instruction, video, and written materials to support subject.  Instructors address signs and symptoms of angina and heart  attacks.    Also discuss risk factors for heart disease and how to make changes to improve heart health risk factors.   Functional Fitness:  -Group instruction provided by verbal instruction, demonstration, patient participation, and written materials to support subject.  Instructors address safety measures for doing things around the house.  Discuss how to get up and down off the floor, how to pick things up properly, how to safely get out of a chair without assistance, and balance training.   CARDIAC REHAB PHASE II EXERCISE from 06/12/2017 in [place]  Date  04/26/17      Meditation and Mindfulness:  -Group instruction provided by verbal instruction, patient participation, and written materials to support subject.  Instructor addresses importance of mindfulness and meditation practice to help reduce stress and improve awareness.  Instructor also leads participants through a meditation exercise.    CARDIAC REHAB PHASE II EXERCISE from 06/12/2017 in [place]  Date  04/03/17      Stretching for Flexibility and Mobility:  -Group instruction provided by verbal instruction, patient participation, and written materials to support subject.  Instructors lead participants through series of stretches that are designed to increase flexibility thus improving mobility.  These stretches are additional exercise for major muscle groups that are typically performed during regular warm up and cool down.   Hands Only CPR:  -Group verbal, video, and participation provides a basic overview of AHA guidelines for community CPR. Role-play of emergencies allow participants the opportunity to practice calling for help and chest compression technique with discussion of AED use.   CARDIAC REHAB PHASE II EXERCISE from 06/12/2017 in [place]  Date  05/31/17  Instruction Review Code  2- Demonstrated Understanding       Hypertension: -Group verbal and written instruction that provides a basic overview of hypertension including the most recent diagnostic guidelines, risk factor reduction with self-care instructions and medication management.   CARDIAC REHAB PHASE II EXERCISE from 06/12/2017 in [place]  Date  04/05/17       Nutrition I class: Heart Healthy Eating:  -Group instruction provided by PowerPoint slides, verbal discussion, and written materials to support subject matter. The instructor gives an explanation and review of the Therapeutic Lifestyle Changes diet recommendations, which includes a discussion on lipid goals, dietary fat, sodium, fiber, plant stanol/sterol esters, sugar, and the components of a well-balanced, healthy diet.   Nutrition II class: Lifestyle Skills:  -Group instruction provided by PowerPoint slides, verbal discussion, and written materials to support subject matter. The instructor gives an explanation and review of label reading, grocery shopping for heart health, heart healthy recipe modifications, and ways to make healthier choices when eating out.   CARDIAC REHAB PHASE II EXERCISE from 06/12/2017 in [place]  Date  03/28/17 [place] handouts given]  Educator  RD      Diabetes Question & Answer:  -Group instruction provided by PowerPoint slides, verbal discussion, and  written materials to support subject matter. The instructor gives an explanation and review of diabetes co-morbidities, pre- and post-prandial blood glucose goals, pre-exercise blood glucose goals, signs, symptoms, and treatment of hypoglycemia and hyperglycemia, and foot care basics.   CARDIAC REHAB PHASE II EXERCISE from 06/12/2017 in [place]  Date  04/12/17      Diabetes Blitz:  -Group instruction provided by PowerPoint slides, verbal discussion, and written materials to support subject matter. The  instructor gives an explanation and review of the physiology behind type 1 and type 2 diabetes, diabetes medications and rational behind using different medications, pre- and post-prandial blood glucose recommendations and Hemoglobin A1c goals, diabetes diet, and exercise including blood glucose guidelines for exercising safely.    Portion Distortion:  -Group instruction provided by PowerPoint slides, verbal discussion, written materials, and food models to support subject matter. The instructor gives an explanation of serving size versus portion size, changes in portions sizes over the last 20 years, and what consists of a serving from each food group.   Stress Management:  -Group instruction provided by verbal instruction, video, and written materials to support subject matter.  Instructors review role of stress in heart disease and how to cope with stress positively.     Exercising on Your Own:  -Group instruction provided by verbal instruction, power point, and written materials to support subject.  Instructors discuss benefits of exercise, components of exercise, frequency and intensity of exercise, and end points for exercise.  Also discuss use of nitroglycerin and activating EMS.  Review options of places to exercise outside of rehab.  Review guidelines for sex with heart disease.   Cardiac Drugs I:  -Group instruction provided by verbal instruction and written materials to support subject.  Instructor reviews cardiac drug classes: antiplatelets, anticoagulants, beta blockers, and statins.  Instructor discusses reasons, side effects, and lifestyle considerations for each drug class.   Cardiac Drugs II:  -Group instruction provided by verbal instruction and written materials to support subject.  Instructor reviews cardiac drug classes: angiotensin converting enzyme inhibitors (ACE-I), angiotensin II receptor blockers (ARBs), nitrates, and calcium channel blockers.  Instructor discusses  reasons, side effects, and lifestyle considerations for each drug class.   CARDIAC REHAB PHASE II EXERCISE from 06/12/2017 in [place]  Date  06/12/17  Instruction Review Code  2- Demonstrated Understanding      Anatomy and Physiology of the Circulatory System:  Group verbal and written instruction and models provide basic cardiac anatomy and physiology, with the coronary electrical and arterial systems. Review of: AMI, Angina, Valve disease, Heart Failure, Peripheral Artery Disease, Cardiac Arrhythmia, Pacemakers, and the ICD.   Other Education:  -Group or individual verbal, written, or video instructions that support the educational goals of the cardiac rehab program.   Holiday Eating Survival Tips:  -Group instruction provided by PowerPoint slides, verbal discussion, and written materials to support subject matter. The instructor gives patients tips, tricks, and techniques to help them not only survive but enjoy the holidays despite the onslaught of food that accompanies the holidays.   Knowledge Questionnaire Score: Knowledge Questionnaire Score - 03/28/17 1626      Knowledge Questionnaire Score   Pre Score  23/24       Core Components/Risk Factors/Patient Goals at Admission: Personal Goals and Risk Factors at Admission - 03/28/17 1709      Core Components/Risk Factors/Patient Goals on Admission    Weight Management  Yes;Weight Maintenance  Intervention  Weight Management: Develop a combined nutrition and exercise program designed to reach desired caloric intake, while maintaining appropriate intake of nutrient and fiber, sodium and fats, and appropriate energy expenditure required for the weight goal.;Weight Management: Provide education and appropriate resources to help participant work on and attain dietary goals.;Weight Management/Obesity: Establish reasonable short term and long term weight goals.    Admit Weight  139 lb 12.4 oz (63.4 kg)     Goal Weight: Short Term  139 lb (63 kg)    Goal Weight: Long Term  139 lb (63 kg)    Expected Outcomes  Short Term: Continue to assess and modify interventions until short term weight is achieved;Long Term: Adherence to nutrition and physical activity/exercise program aimed toward attainment of established weight goal;Weight Maintenance: Understanding of the daily nutrition guidelines, which includes 25-35% calories from fat, 7% or less cal from saturated fats, less than 200mg  cholesterol, less than 1.5gm of sodium, & 5 or more servings of fruits and vegetables daily    Hypertension  Yes    Intervention  Provide education on lifestyle modifcations including regular physical activity/exercise, weight management, moderate sodium restriction and increased consumption of fresh fruit, vegetables, and low fat dairy, alcohol moderation, and smoking cessation.;Monitor prescription use compliance.    Expected Outcomes  Short Term: Continued assessment and intervention until BP is < 140/63mm HG in hypertensive participants. < 130/2mm HG in hypertensive participants with diabetes, heart failure or chronic kidney disease.;Long Term: Maintenance of blood pressure at goal levels.    Lipids  Yes    Intervention  Provide education and support for participant on nutrition & aerobic/resistive exercise along with prescribed medications to achieve LDL 70mg , HDL >40mg .    Expected Outcomes  Short Term: Participant states understanding of desired cholesterol values and is compliant with medications prescribed. Participant is following exercise prescription and nutrition guidelines.;Long Term: Cholesterol controlled with medications as prescribed, with individualized exercise RX and with personalized nutrition plan. Value goals: LDL < 70mg , HDL > 40 mg.    Stress  Yes    Intervention  Offer individual and/or small group education and counseling on adjustment to heart disease, stress management and health-related lifestyle  change. Teach and support self-help strategies.;Refer participants experiencing significant psychosocial distress to appropriate mental health specialists for further evaluation and treatment. When possible, include family members and significant others in education/counseling sessions.    Expected Outcomes  Short Term: Participant demonstrates changes in health-related behavior, relaxation and other stress management skills, ability to obtain effective social support, and compliance with psychotropic medications if prescribed.;Long Term: Emotional wellbeing is indicated by absence of clinically significant psychosocial distress or social isolation.       Core Components/Risk Factors/Patient Goals Review:  Goals and Risk Factor Review    Row Name 04/25/17 1608 05/21/17 1551 06/19/17 0848         Core Components/Risk Factors/Patient Goals Review   Personal Goals Review  Weight Management/Obesity;Hypertension  Weight Management/Obesity;Hypertension  Weight Management/Obesity;Hypertension     Review  Amariona's vital signs have been stable at cardiac rehab. Druanne has maintained her weight.  Efrata's vital signs have been stable at cardiac rehab. Gagandeep has maintained her weight.  Tanji's vital signs have been stable at cardiac rehab. Zyanya has maintained her weight.     Expected Outcomes  Katelen will continue to partcipate in phase 2 cardiac rehab and take her medications as presribed.  Brandice will continue to partcipate in phase 2 cardiac rehab and take her medications as  presribed.  Kemisha will continue to partcipate in phase 2 cardiac rehab and take her medications as presribed.        Core Components/Risk Factors/Patient Goals at Discharge (Final Review):  Goals and Risk Factor Review - 06/19/17 0848      Core Components/Risk Factors/Patient Goals Review   Personal Goals Review  Weight Management/Obesity;Hypertension    Review  Nariya's vital signs have been stable at cardiac rehab. Yarixa has  maintained her weight.    Expected Outcomes  Ikran will continue to partcipate in phase 2 cardiac rehab and take her medications as presribed.       ITP Comments: ITP Comments    Row Name 03/28/17 1305 04/25/17 1606 05/21/17 1550 06/19/17 0847     ITP Comments  Dr Fransico Him, Medical Director  30 Day ITP Review. Edwinna has good atttendance and participation in phase 2 cardiac rehab. Josselyne will be out during the Holiday season to visit with family out of town  48 Day ITP Review. Zaydee has good atttendance and participation in phase 2 cardiac rehab. [place] retruned to exercise today after being out of town visitng family.  30 Day ITP Review. Brandon has good atttendance and participation in phase 2 cardiac rehab.        Comments: See ITP comments.Barnet Pall, RN,BSN 06/20/2017 10:33 AM

## 2017-06-21 ENCOUNTER — Encounter (HOSPITAL_COMMUNITY): Payer: Medicare Other

## 2017-06-24 ENCOUNTER — Encounter (HOSPITAL_COMMUNITY)
Admission: RE | Admit: 2017-06-24 | Discharge: 2017-06-24 | Disposition: A | Discharge status: Home / Self Care | Payer: Medicare Other | Source: Ambulatory Visit | Attending: Interventional Cardiology | Admitting: Interventional Cardiology

## 2017-06-24 ENCOUNTER — Encounter (HOSPITAL_COMMUNITY): Payer: Medicare Other

## 2017-06-24 DIAGNOSIS — Z955 Presence of coronary angioplasty implant and graft: Secondary | ICD-10-CM

## 2017-06-24 DIAGNOSIS — I214 Non-ST elevation (NSTEMI) myocardial infarction: Secondary | ICD-10-CM

## 2017-06-25 ENCOUNTER — Telehealth: Payer: Self-pay | Admitting: Interventional Cardiology

## 2017-06-25 NOTE — Telephone Encounter (Signed)
Would recommend starting on lower dose of minoxidil (5-10mg  daily) and monitoring BP to ensure she doesn't become hypotensive. Could also try topical minoxidil (Rogaine) to help with hair loss since this would not affect BP readings.  She previously reported GI intolerance to Lipitor 40mg  daily but I cannot see any other statin history. Would recommend starting Crestor 10mg  daily in addition to her Zetia and rechecking a lipid panel in 3 months. This should bring LDL to goal < 70.

## 2017-06-25 NOTE — Telephone Encounter (Signed)
New message     A specialist at [facility] recommending Minoxidil for hair loss and the patient wants to know if this is okay for her to take, because it can effect bp and heart.    Dr Delfina Redwood office wants to know about trying a statin on the patient for cholesterol, she is currently taking Zetia  ?

## 2017-06-25 NOTE — Telephone Encounter (Signed)
Left message for Tammy from Dr. Lina Sar office to call back.

## 2017-06-25 NOTE — Telephone Encounter (Signed)
I agree with Megan's recs

## 2017-06-25 NOTE — Telephone Encounter (Signed)
Tammy from Dr. Lina Sar office calling and states that they recently saw the patient and states that she has a couple of questions.  1. A specialist from [facility] has recommended that the patient start minoxidil tablets PO for hair loss. They are wanting to know if this is okay for her to take since it can affect her BP.   2. She states that the patient is only taking zetia 10 mg QD and is now willing to try another statin. They are wanting to know which one they should start her on.   Made Tammy aware that I would forward the information to Dr. Irish Lack and the PharmD for review and recommendation.

## 2017-06-26 ENCOUNTER — Encounter (HOSPITAL_COMMUNITY)
Admission: RE | Admit: 2017-06-26 | Discharge: 2017-06-26 | Disposition: A | Discharge status: Home / Self Care | Payer: Medicare Other | Source: Ambulatory Visit | Attending: Interventional Cardiology | Admitting: Interventional Cardiology

## 2017-06-26 ENCOUNTER — Encounter (HOSPITAL_COMMUNITY): Payer: Medicare Other

## 2017-06-26 VITALS — Ht 66.5 in | Wt 138.9 lb

## 2017-06-26 DIAGNOSIS — I214 Non-ST elevation (NSTEMI) myocardial infarction: Secondary | ICD-10-CM | POA: Diagnosis not present

## 2017-06-26 DIAGNOSIS — Z955 Presence of coronary angioplasty implant and graft: Secondary | ICD-10-CM

## 2017-06-28 ENCOUNTER — Encounter (HOSPITAL_COMMUNITY): Payer: Medicare Other

## 2017-06-28 ENCOUNTER — Encounter (HOSPITAL_COMMUNITY)
Admission: RE | Admit: 2017-06-28 | Discharge: 2017-06-28 | Disposition: A | Discharge status: Home / Self Care | Payer: Medicare Other | Source: Ambulatory Visit | Attending: Interventional Cardiology | Admitting: Interventional Cardiology

## 2017-06-28 DIAGNOSIS — I214 Non-ST elevation (NSTEMI) myocardial infarction: Secondary | ICD-10-CM | POA: Insufficient documentation

## 2017-06-28 DIAGNOSIS — Z955 Presence of coronary angioplasty implant and graft: Secondary | ICD-10-CM | POA: Diagnosis present

## 2017-06-28 NOTE — Telephone Encounter (Signed)
Left detailed message with Dr. Lina Sar nurse Carlyon Shadow, for Tammy, letting her know the the recommendation was to start on a lower dose of minoxidil (5-10mg  daily) and have the patient monitor BP to ensure that she doesn't become hypotensive. (Patient is already trying topical minoxidil.)  Also made her aware that the recommendation for starting a statin would be to start Crestor 10mg  daily in addition to her Zetia and recheck a lipid panel in 3 months.  Instructed Darlene to have Tammy call me back if she wanted [name] to manage the patient's cholesterol and lab recheck. She verbalized understanding.

## 2017-07-01 ENCOUNTER — Encounter (HOSPITAL_COMMUNITY): Payer: Medicare Other

## 2017-07-01 ENCOUNTER — Telehealth (HOSPITAL_COMMUNITY): Payer: Self-pay | Admitting: Internal Medicine

## 2017-07-03 ENCOUNTER — Encounter (HOSPITAL_COMMUNITY): Payer: Medicare Other

## 2017-07-05 ENCOUNTER — Encounter (HOSPITAL_COMMUNITY): Payer: Medicare Other

## 2017-07-08 ENCOUNTER — Encounter (HOSPITAL_COMMUNITY)
Admission: RE | Admit: 2017-07-08 | Discharge: 2017-07-08 | Disposition: A | Discharge status: Home / Self Care | Payer: Medicare Other | Source: Ambulatory Visit | Attending: Interventional Cardiology | Admitting: Interventional Cardiology

## 2017-07-08 DIAGNOSIS — Z955 Presence of coronary angioplasty implant and graft: Secondary | ICD-10-CM

## 2017-07-08 DIAGNOSIS — I214 Non-ST elevation (NSTEMI) myocardial infarction: Secondary | ICD-10-CM

## 2017-07-08 NOTE — Progress Notes (Signed)
Discharge Progress Report  Patient Details  Name: [name] MRN: [id number] Date of Birth: May 16, 1945 Referring Provider:     [facility] from 03/28/2017 in [place]  Referring Provider  Daneen Schick MD       Number of Visits: 20  Reason for Discharge:  Patient has met program and personal goals.  Smoking History:  Social History   Tobacco Use  Smoking Status Former Smoker  . Packs/day: 0.25  . Years: 15.00  . Pack years: 3.75  . Types: Cigarettes  Smokeless Tobacco Never Used  Tobacco Comment   social smoker/"on and off"    Diagnosis:  S/P coronary artery stent placement 01/01/17  NSTEMI (non-ST elevated myocardial infarction) ([facility]) 12/31/16  ADL [facility]:   Initial Exercise Prescription: Initial Exercise Prescription - 03/28/17 1400      Date of Initial Exercise RX and Referring Provider   Date  03/28/17    Referring Provider  Daneen Schick MD      Recumbant Bike   Level  2    Minutes  15    METs  2      NuStep   Level  3    SPM  70    Minutes  15    METs  2.5      Track   Laps  13    Minutes  15    METs  2.51      Prescription Details   Frequency (times per week)  3    Duration  Progress to 30 minutes of continuous aerobic without signs/symptoms of physical distress      Intensity   THRR 40-80% of Max Heartrate  57-114    Ratings of Perceived Exertion  11-13    Perceived Dyspnea  0-4      Progression   Progression  Continue to progress workloads to maintain intensity without signs/symptoms of physical distress.      Resistance Training   Training Prescription  Yes    Weight  3lbs    Reps  10-15       Discharge Exercise Prescription (Final Exercise Prescription Changes): Exercise Prescription Changes - 07/08/17 1600      Response to Exercise   Blood Pressure (Admit)  104/64    Blood Pressure (Exercise)  136/80    Blood Pressure (Exit)  110/70    Heart Rate (Admit)  88 bpm     Heart Rate (Exercise)  92 bpm    Heart Rate (Exit)  80 bpm    Rating of Perceived Exertion (Exercise)  12    Duration  Progress to 45 minutes of aerobic exercise without signs/symptoms of physical distress    Intensity  THRR unchanged      Progression   Progression  Continue to progress workloads to maintain intensity without signs/symptoms of physical distress.    Average METs  302      Resistance Training   Training Prescription  Yes    Weight  4lbs    Reps  10-15    Time  10 Minutes      Interval Training   Interval Training  No      Recumbant Bike   Level  2.8    Minutes  10    METs  2.6      NuStep   Level  4    SPM  80    Minutes  10    METs  4  Track   Laps  13    Minutes  10    METs  3.3      Home Exercise Plan   Plans to continue exercise at  Home (comment) Walking    Frequency  Add 2 additional days to program exercise sessions.    Initial Home Exercises Provided  04/26/17       Functional Capacity: 6 Minute Walk    Row Name 03/28/17 1438 06/27/17 1551       6 Minute Walk   Phase  Initial  Discharge    Distance  1710 feet  1800 feet    Distance % Change  -  5.26 %    Distance Feet Change  -  90 ft    Walk Time  6 minutes  6 minutes    # of Rest Breaks  0  0    MPH  3.23  3.41    METS  3.59  3.61    RPE  12  12    Perceived Dyspnea   1  0    VO2 Peak  12.58  12.63    Symptoms  Yes (comment)  No    Comments  Mild SOB   -    Resting HR  62 bpm  65 bpm    Resting BP  122/70  124/60    Resting Oxygen Saturation   97 %  -    Exercise Oxygen Saturation  during 6 min walk  96 %  -    Max Ex. HR  100 bpm  93 bpm    Max Ex. BP  130/78  118/62    2 Minute Post BP  112/70  118/66       Psychological, QOL, Others - Outcomes: PHQ 2/9: Depression screen [facility] 2/9 07/08/2017 04/03/2017 10/15/2016 04/05/2016 12/21/2015  Decreased Interest 0 0 0 0 0  Down, Depressed, Hopeless 0 0 0 0 -  PHQ - 2 Score 0 0 0 0 0  Some recent data might be hidden     Quality of Life: Quality of Life - 07/03/17 0825      Quality of Life Scores   Health/Function Pre  28 %    Health/Function Post  28 %    Health/Function % Change  0 %    Socioeconomic Pre  30 %    Socioeconomic Post  30 %    Socioeconomic % Change   0 %    Psych/Spiritual Pre  29.14 %    Psych/Spiritual Post  29.14 %    Psych/Spiritual % Change  0 %    Family Pre  30 %    Family Post  27.6 %    Family % Change  -8 %    GLOBAL Pre  28.94 %    GLOBAL Post  28.63 %    GLOBAL % Change  -1.07 %       Personal Goals: Goals established at orientation with interventions provided to work toward goal. Personal Goals and Risk Factors at Admission - 03/28/17 1709      Core Components/Risk Factors/Patient Goals on Admission    Weight Management  Yes;Weight Maintenance    Intervention  Weight Management: Develop a combined nutrition and exercise program designed to reach desired caloric intake, while maintaining appropriate intake of nutrient and fiber, sodium and fats, and appropriate energy expenditure required for the weight goal.;Weight Management: Provide education and appropriate resources to help participant work on and attain dietary goals.;Weight Management/Obesity: Establish  reasonable short term and long term weight goals.    Admit Weight  139 lb 12.4 oz (63.4 kg)    Goal Weight: Short Term  139 lb (63 kg)    Goal Weight: Long Term  139 lb (63 kg)    Expected Outcomes  Short Term: Continue to assess and modify interventions until short term weight is achieved;Long Term: Adherence to nutrition and physical activity/exercise program aimed toward attainment of established weight goal;Weight Maintenance: Understanding of the daily nutrition guidelines, which includes 25-35% calories from fat, 7% or less cal from saturated fats, less than 272m cholesterol, less than 1.5gm of sodium, & 5 or more servings of fruits and vegetables daily    Hypertension  Yes    Intervention  Provide  education on lifestyle modifcations including regular physical activity/exercise, weight management, moderate sodium restriction and increased consumption of fresh fruit, vegetables, and low fat dairy, alcohol moderation, and smoking cessation.;Monitor prescription use compliance.    Expected Outcomes  Short Term: Continued assessment and intervention until BP is < 140/952mHG in hypertensive participants. < 130/8061mG in hypertensive participants with diabetes, heart failure or chronic kidney disease.;Long Term: Maintenance of blood pressure at goal levels.    Lipids  Yes    Intervention  Provide education and support for participant on nutrition & aerobic/resistive exercise along with prescribed medications to achieve LDL <65m33mDL >40mg51m Expected Outcomes  Short Term: Participant states understanding of desired cholesterol values and is compliant with medications prescribed. Participant is following exercise prescription and nutrition guidelines.;Long Term: Cholesterol controlled with medications as prescribed, with individualized exercise RX and with personalized nutrition plan. Value goals: LDL < 65mg,19m > 40 mg.    Stress  Yes    Intervention  Offer individual and/or small group education and counseling on adjustment to heart disease, stress management and health-related lifestyle change. Teach and support self-help strategies.;Refer participants experiencing significant psychosocial distress to appropriate mental health specialists for further evaluation and treatment. When possible, include family members and significant others in education/counseling sessions.    Expected Outcomes  Short Term: Participant demonstrates changes in health-related behavior, relaxation and other stress management skills, ability to obtain effective social support, and compliance with psychotropic medications if prescribed.;Long Term: Emotional wellbeing is indicated by absence of clinically significant psychosocial  distress or social isolation.        Personal Goals Discharge: Goals and Risk Factor Review    Row Name 05/21/17 1551 06/19/17 0848 07/08/17 1146         Core Components/Risk Factors/Patient Goals Review   Personal Goals Review  Weight Management/Obesity;Hypertension  Weight Management/Obesity;Hypertension  Weight Management/Obesity;Hypertension     Review  Markeda's vital signs have been stable at cardiac rehab. Tiauna has maintained her weight.  Rhesa's vital signs have been stable at cardiac rehab. Nura has maintained her weight.  Jaydeen's vital signs have been stable at cardiac rehab. Darian has maintained her weight.     Expected Outcomes  Railee Lilleighcontinue to partcipate in phase 2 cardiac rehab and take her medications as presribed.  Keilany Artaviacontinue to partcipate in phase 2 cardiac rehab and take her medications as presribed.  Upon graduation Mishka Bethanycontinue to exercise on her own and participate in the maintenance program        Exercise Goals and Review: Exercise Goals    Row Name 03/28/17 1425             Exercise Goals   Increase Physical  Activity  Yes       Intervention  Provide advice, education, support and counseling about physical activity/exercise needs.;Develop an individualized exercise prescription for aerobic and resistive training based on initial evaluation findings, risk stratification, comorbidities and participant's personal goals.       Expected Outcomes  Achievement of increased cardiorespiratory fitness and enhanced flexibility, muscular endurance and strength shown through measurements of functional capacity and personal statement of participant.       Increase Strength and Stamina  Yes Improve Muscular Strength, Walking Tolerance, and Motivation to Fitness       Intervention  Provide advice, education, support and counseling about physical activity/exercise needs.;Develop an individualized exercise prescription for aerobic and resistive training based  on initial evaluation findings, risk stratification, comorbidities and participant's personal goals.       Expected Outcomes  Achievement of increased cardiorespiratory fitness and enhanced flexibility, muscular endurance and strength shown through measurements of functional capacity and personal statement of participant.       Able to understand and use rate of perceived exertion (RPE) scale  Yes       Intervention  Provide education and explanation on how to use RPE scale       Expected Outcomes  Short Term: Able to use RPE daily in rehab to express subjective intensity level;Long Term:  Able to use RPE to guide intensity level when exercising independently       Knowledge and understanding of Target Heart Rate Range (THRR)  Yes       Intervention  Provide education and explanation of THRR including how the numbers were predicted and where they are located for reference       Expected Outcomes  Short Term: Able to state/look up THRR;Long Term: Able to use THRR to govern intensity when exercising independently;Short Term: Able to use daily as guideline for intensity in rehab       Able to check pulse independently  Yes       Intervention  Provide education and demonstration on how to check pulse in carotid and radial arteries.;Review the importance of being able to check your own pulse for safety during independent exercise       Expected Outcomes  Short Term: Able to explain why pulse checking is important during independent exercise;Long Term: Able to check pulse independently and accurately       Understanding of Exercise Prescription  Yes       Intervention  Provide education, explanation, and written materials on patient's individual exercise prescription       Expected Outcomes  Short Term: Able to explain program exercise prescription;Long Term: Able to explain home exercise prescription to exercise independently          Nutrition & Weight - Outcomes: Pre Biometrics - 03/28/17 1437       Pre Biometrics   Height  5' 3.5" (1.613 m)    Weight  139 lb 12.4 oz (63.4 kg)    Waist Circumference  32 inches    Hip Circumference  40 inches    Waist to Hip Ratio  0.8 %    BMI (Calculated)  24.37    Triceps Skinfold  30 mm    % Body Fat  36.9 %    Grip Strength  26 kg    Flexibility  0 in    Single Leg Stand  1.21 seconds      Post Biometrics - 06/27/17 [place]  Height  5' 6.5" (1.689 m)    Weight  138 lb 14.2 oz (63 kg)    Waist Circumference  32 inches    Hip Circumference  39.5 inches    Waist to Hip Ratio  0.81 %    BMI (Calculated)  22.08    Triceps Skinfold  30 mm    % Body Fat  36.9 %    Grip Strength  26 kg    Flexibility  0 in    Single Leg Stand  9.25 seconds       Nutrition: Nutrition Therapy & Goals - 03/28/17 1535      Nutrition Therapy   Diet  Heart Healthy      Personal Nutrition Goals   Nutrition Goal  Pt to identify food quantities necessary to achieve weight loss of 5-8 lb at graduation from cardiac rehab. Goal wt of 130 lb desired.       Intervention Plan   Intervention  Prescribe, educate and counsel regarding individualized specific dietary modifications aiming towards targeted core components such as weight, hypertension, lipid management, diabetes, heart failure and other comorbidities.    Expected Outcomes  Short Term Goal: Understand basic principles of dietary content, such as calories, fat, sodium, cholesterol and nutrients.;Long Term Goal: Adherence to prescribed nutrition plan.       Nutrition Discharge: Nutrition Assessments - 07/12/17 1120      MEDFICTS Scores   Pre Score  27    Post Score  21    Score Difference  -6       Education Questionnaire Score: Knowledge Questionnaire Score - 07/03/17 0826      Knowledge Questionnaire Score   Post Score  19/24       Goals reviewed with patient; copy given to patient.Daine  graduated from cardiac rehab program today with completion of 20 exercise sessions  in Phase II. Pt maintained good attendance and progressed nicely during her participation in rehab as evidenced by increased MET level.   Medication list reconciled. Repeat  PHQ score-  0.  Pt has made significant lifestyle changes and should be commended for her success. Pt feels he has achieved her goals during cardiac rehab. Carlie increased her distance on her post exercise walk test and maintained her weight.Ekaterina  plans to continue exercise in cardiac maintenance program after taking a break.Barnet Pall, RN,BSN 07/15/2017 5:24 PM

## 2017-08-05 ENCOUNTER — Other Ambulatory Visit: Payer: Self-pay | Admitting: Cardiology

## 2017-08-05 ENCOUNTER — Encounter (HOSPITAL_COMMUNITY): Payer: Self-pay

## 2017-08-07 ENCOUNTER — Encounter (HOSPITAL_COMMUNITY): Payer: Self-pay

## 2017-08-09 ENCOUNTER — Encounter (HOSPITAL_COMMUNITY): Payer: Self-pay

## 2017-08-12 ENCOUNTER — Encounter (HOSPITAL_COMMUNITY)
Admission: RE | Admit: 2017-08-12 | Discharge: 2017-08-12 | Disposition: A | Discharge status: Home / Self Care | Payer: Self-pay | Source: Ambulatory Visit | Attending: Interventional Cardiology | Admitting: Interventional Cardiology

## 2017-08-12 DIAGNOSIS — Z955 Presence of coronary angioplasty implant and graft: Secondary | ICD-10-CM | POA: Insufficient documentation

## 2017-08-12 DIAGNOSIS — I214 Non-ST elevation (NSTEMI) myocardial infarction: Secondary | ICD-10-CM | POA: Insufficient documentation

## 2017-08-14 ENCOUNTER — Encounter (HOSPITAL_COMMUNITY)
Admission: RE | Admit: 2017-08-14 | Discharge: 2017-08-14 | Disposition: A | Discharge status: Home / Self Care | Payer: Self-pay | Source: Ambulatory Visit | Attending: Interventional Cardiology | Admitting: Interventional Cardiology

## 2017-08-14 ENCOUNTER — Other Ambulatory Visit: Payer: Medicare Other

## 2017-08-14 ENCOUNTER — Ambulatory Visit: Payer: Medicare Other | Admitting: Oncology

## 2017-08-16 ENCOUNTER — Encounter (HOSPITAL_COMMUNITY): Payer: Self-pay

## 2017-08-19 ENCOUNTER — Telehealth: Payer: Self-pay | Admitting: Oncology

## 2017-08-19 ENCOUNTER — Encounter (HOSPITAL_COMMUNITY)
Admission: RE | Admit: 2017-08-19 | Discharge: 2017-08-19 | Disposition: A | Discharge status: Home / Self Care | Payer: Self-pay | Source: Ambulatory Visit | Attending: Interventional Cardiology | Admitting: Interventional Cardiology

## 2017-08-19 NOTE — Progress Notes (Signed)
[facility]  [phone] Fax:(336) [phone]     ID: MELI FALEY DOB: November 08, 1945  MR#: [id number]  OIZ#:[id number]  Patient Care Team: Seward Carol, MD as PCP - General (Internal Medicine) Llesenia Fogal, Virgie Dad, MD as Consulting Physician (Oncology) Fanny Skates, MD as Consulting Physician (General Surgery) Luberta Robertson, MD (Radiology) Hennie Duos, MD as Consulting Physician (Rheumatology) Domingo Pulse, MD (Urology) Druscilla Brownie, MD as Consulting Physician (Dermatology) Luberta Mutter, MD as Consulting Physician (Ophthalmology) Magnus Sinning, MD as Consulting Physician (Physical Medicine and Rehabilitation) Benson Norway, RN as Registered Nurse (Oncology) Megan Salon, MD as Consulting Physician (Gynecology) Loney Loh, MD (Dermatology) OTHER MD:  CHIEF COMPLAINT: Estrogen receptor positive breast cancer  CURRENT TREATMENT:  Anastrozole  INTERVAL HISTORY: Octavie returns today for follow-up and treatment of her estrogen receptor positive breast cancer. She continues on anastrozole, with good tolerance. She has occasional hot flashes. She has occasional queasiness, but not enough to vomit. She reports vaginal dryness that she aids this with Replens, but at present, she is not interested in doing the " pelvic health" program.  She is participating in the PALLAS trial, under observation (no palbociclib).   REVIEW OF SYSTEMS: Nyia reports that she is doing well. About 7 weeks ago, she saw her dentist, Dr. Verlee Monte, because her gums were swollen and painful. She had a remote tooth implant removal. 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 She just bought a new wig, and she feels that this is her natural hair. She is doing cardiac exercise rehabilitation under [facility] which is $68 per month. She does 3 machines and runs about 3 laps for 1 mile 3 times per week. Her grandchildren are 3 and 1 now. She plans on going to  [place] eventually. She was having pain from her fibromyalgia. She followed up with her PCP, who, while running tests, found that her Thyroid levels have changed. She denies unusual headaches, visual changes, nausea, vomiting, or dizziness. There has been no unusual cough, phlegm production, or pleurisy. This been no change in bowel or bladder habits. She denies unexplained fatigue or unexplained weight loss, bleeding, rash, or fever. A detailed review of systems was otherwise stable.     BREAST CANCER HISTORY: From the original intake note:  Lahna had routine mammographic screening in 08/18/2015 at the [facility], showing a right breast mass and possible axillary adenopathy. On 08/23/2015 she underwent bilateral diagnostic mammography with tomography and bilateral breast ultrasonography. The breast density was category B in the right breast there was a spiculated mass in the upper-outer quadrant measuring 1.2 cm and associated with pleomorphic calcifications. There were calcifications extending anteriorly and posteriorly from the mass worrisome for ductal carcinoma in situ. The total area in question is about 3 cm. There were also multiple enlarged right axillary lymph nodes. The mass was palpated as an area of fullness at the 12:00 position of the breast 3 cm from the nipple. There were palpable enlarged right axillary lymph nodes in the right axilla. Ultrasonography of the right breast confirmed an irregular hypoechoic mass in the area in question measuring 1.6 cm. There were multiple abnormal appearing enlarged right axillary lymph nodes, the largest measuring 2.3 cm.  In the left breast, mammography showed an area of architectural distortion associated with the left nipple. This was not palpable. Ultrasonography of the left breast found no correlate. This area of the left breast was biopsied under tomography 08/24/2015. This showed (SAA  [phone]) a complex sclerosing lesion and fibroadenoma.  On  08/23/2015 the patient underwent biopsy of the right breast mass in 1 of the abnormal right axillary lymph nodes. 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 Both biopsies were HER-2 nonamplified, the breast being ratio 1.27 and number per cell 1.90, and the lymph node 1 ratio 1.24 and number per cell 3.0.  On 08/26/2015 the patient underwent bilateral breast MRI. In the left breast there was no suspicious enhancement. The left axilla was benign. In the right breast there was a mass described as in the upper inner quadrant measuring 2.3 cm. Less than a centimeter away from this mass there was a satellite nodule measuring 0.9 cm. In the right axilla there were multiple enlarged right axillary lymph nodes, including a confluent region measuring up to 4 cm. There were also enlarged level II lymph nodes present measuring up to 2.1 cm.  The patient's subsequent history is as detailed below  PAST MEDICAL HISTORY: Past Medical History:  Diagnosis Date  . Anxiety   . Arthritis 2008  . Asthma 1953   age 72  . Breast cancer ([place])   . Cancer [facility])    breast cancer  . Chronic vaginitis 1998  . Fibromyalgia 1982  . GERD (gastroesophageal reflux disease)    since at least 2011  . Hemorrhoids 2009  . History of radiation therapy 01/18/16-03/05/16   right breast/nodal areas 45 Gy, right breast boost 16 Gy  . Hypoglycemia 2003  . Hypothyroidism 1964   age 72  . Interstitial cystitis 1998  . Irritable bowel syndrome 06/14/2009   Qualifier: Diagnosis of  By: Patsy Baltimore RN, Langley Gauss    . Menopausal symptoms   . Osteoporosis 12/24/2006  . Personal history of chemotherapy   . Personal history of radiation therapy   . Pneumonia 1979   varicella  . PONV (postoperative nausea and vomiting) 1979    PAST SURGICAL HISTORY: Past Surgical History:    Procedure Laterality Date  . BLADDER HYPEREXTENSION     X4, prior to 01/2006  . BLADDER SUSPENSION    . BREAST BIOPSY    . BREAST LUMPECTOMY Right    2017  . BREAST LUMPECTOMY WITH NEEDLE LOCALIZATION AND AXILLARY LYMPH NODE DISSECTION Right 12/05/2015   Procedure: RIGHT BREAST NEEDLE LOCALIZED (WIRES X 2) LUMPECTOMY WITH AXILLARY LYMPH NODE DISSECTION;  Surgeon: Fanny Skates, MD;  Location: [facility];  Service: General;  Laterality: Right;  . BREAST LUMPECTOMY WITH RADIOACTIVE SEED LOCALIZATION Left 12/05/2015   Procedure: RADIOACTIVE SEED GUIDED LEFT BREAST LUMPECTOMY;  Surgeon: Fanny Skates, MD;  Location: [facility];  Service: General;  Laterality: Left;  . BREAST SURGERY    . BUNIONECTOMY     prior to 01/2006  . CARDIAC CATHETERIZATION    . CESAREAN SECTION  1979  . CHOLECYSTECTOMY  02/08/2006   DR. INGRAM  . COLONOSCOPY    . CORONARY STENT INTERVENTION N/A 01/01/2017   Procedure: CORONARY STENT INTERVENTION;  Surgeon: Jettie Booze, MD;  Location: [facility];  Service: Cardiovascular;  Laterality: N/A;  . DENTAL SURGERY  2009-2016   13 dental implants  . LEFT HEART CATH AND CORONARY ANGIOGRAPHY N/A 01/01/2017   Procedure: LEFT HEART CATH AND CORONARY ANGIOGRAPHY;  Surgeon: Jettie Booze, MD;  Location: [facility];  Service: Cardiovascular;  Laterality: N/A;  . LEG  SURGERY  07/06/2004   Broken tibia  . NASAL SINUS SURGERY     X4; prior to 01/2006  . PORT-A-CATH REMOVAL Right 12/05/2015   Procedure: REMOVAL PORT-A-CATH;  Surgeon: Fanny Skates, MD;  Location: [facility];  Service: General;  Laterality: Right;  . PORTACATH PLACEMENT Left 09/08/2015   Procedure: INSERTION OF PORT-A-CATH ;  Surgeon: Fanny Skates, MD;  Location: [facility];  Service: General;  Laterality: Left;    FAMILY HISTORY Family History  Problem Relation Age of Onset  . Hypertension Mother   . Heart disease Mother        d. 63  . Parkinsonism Father        d. 80  . Heart disease Sister   .  Breast cancer Sister 48       s/p mastectomy; reportedly negative BRCA1/2 testing approx 7 years ago  . Melanoma Sister        d. 63s; hx of incresed sun exposure  . Cancer Sister        dx. sarcoma of her face in her 33s; treated w/ MOHS surgery  . Parkinsonism Paternal Aunt        d. 90s  . Parkinsonism Paternal Uncle        d. late 68s  . Kidney failure Maternal Grandmother 58  . Heart attack Maternal Grandfather        d. 25s  . Heart disease Paternal Grandmother        d. 57  . Breast cancer Other 50       maternal great aunt (MGF's sister)  . Kidney cancer Paternal Uncle 22       d. 25s; unsure about smoking status  The patient's father died at age 31 from complications of Parkinson's disease. The patient's mother died age 40 with heart disease. Beronica had no brothers. She had 2 sisters. One sister was diagnosed with breast cancer at age 74. She was tested for the BRCA gene and was negative. The same sister also had melanoma diagnosed in her 53s. The patient has one maternal great aunt diagnosed with breast cancer around age 16. There is no history of ovarian cancer in the family  GYNECOLOGIC HISTORY:  Patient's last menstrual period was 04/30/1996 (approximate). Menarche age 32, first live birth age 61. The patient is GX P2. She stopped having periods in her early 75s and took hormone replacement until her breast cancer diagnosis, April 2017.  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 He has been in remission for more than 10 years. Their daughter Nira Conn lives in [place] were she is Garment/textile technologist of the [employer]. Daughter Belenda Cruise is an infectious disease M.D. working and teaching at [employer] in [place]. The patient has 2 grandchildren. The family attends [employer]: Not in place   HEALTH MAINTENANCE: Social History   Tobacco Use  . Smoking status: Former Smoker    Packs/day: 0.25    Years: 15.00    Pack years: 3.75    Types: Cigarettes  . Smokeless tobacco: Never Used  . Tobacco comment: social smoker/"on and off"  Substance Use Topics  . Alcohol use: Yes    Alcohol/week: 0.0 oz    Comment: social  . Drug use: No     Colonoscopy: 2016/  [facility]  PAP: 2015  Bone density: October 2018, T-score: -2.7  Lipid panel:  Allergies  Allergen Reactions  . Chlorhexidine Other (See Comments)    [place] IS OK. The CLOTH WIPE causes reaction. Causes itching all over the area  . Other Rash    Adhesive tape    Current Outpatient Medications  Medication Sig Dispense Refill  . albuterol (PROVENTIL HFA;VENTOLIN HFA) 108 (90 Base) MCG/ACT inhaler Inhale 1-2 puffs into the lungs every 6 (six) hours as needed for wheezing or shortness of breath. Started in 1998    . anastrozole (ARIMIDEX) 1 MG tablet Take 1 tablet (1 mg total) by mouth daily. 90 tablet 4  . aspirin EC 81 MG EC tablet Take 1 tablet (81 mg total) by mouth daily. 30 tablet 0  . buPROPion (WELLBUTRIN XL) 300 MG 24 hr tablet Take 300 mg by mouth daily.     . Calcium Carbonate-Vitamin D (CALCIUM + D PO) Take 1 tablet by mouth at bedtime. Started approximately 1998    . carisoprodol (SOMA) 350 MG tablet Take 350 mg by mouth 2 (two) times daily as needed for muscle spasms. Started in 2008.  2  . diazepam (VALIUM) 5 MG tablet Take 5 mg by mouth every 6 (six) hours as needed for muscle spasms (interstitial cystitis). Started approximately 2008.    Marland Kitchen ezetimibe (ZETIA) 10 MG tablet Take 1 tablet (10 mg total) by mouth daily. 30 tablet 11  . furosemide (LASIX) 20 MG tablet Take 1 tablet (20 mg total) by mouth daily as needed (shortness of breath). 30 tablet 3  . HYDROcodone-acetaminophen (NORCO) 10-325 MG tablet Take 1 tablet by mouth every 4 (four) hours as needed for  moderate pain or severe pain (as needed for fibromyalgia and IC).     . hydrOXYzine (VISTARIL) 25 MG capsule Take 25 mg by mouth 3 times/day as needed-between meals & bedtime.     Marland Kitchen levothyroxine (SYNTHROID, LEVOTHROID) 100 MCG tablet Take 100 mcg by mouth daily before breakfast.    . LORazepam (ATIVAN) 2 MG tablet Take 1 tablet by mouth daily as needed for anxiety.   5  . metoprolol succinate (TOPROL-XL) 25 MG 24 hr tablet TAKE 1/2 TABLET(12.5 MG) BY MOUTH DAILY 15 tablet 3  . minoxidil (ROGAINE) 2 % external solution Apply topically 2 (two) times daily.    . Multiple Vitamin (MULTIVITAMIN) capsule Take 1 capsule by mouth daily. Taking since at least 1988.    . nitroGLYCERIN (NITROSTAT) 0.4 MG SL tablet Place 1 tablet (0.4 mg total) under the tongue every 5 (five) minutes as needed for chest pain. 25 tablet 3  . NONFORMULARY OR COMPOUNDED ITEM Place 1 suppository vaginally daily. compounded medication - vitamin E    . ondansetron (ZOFRAN) 4 MG tablet Take 1 tablet by mouth daily as needed for nausea or vomiting.   3  . pantoprazole (PROTONIX) 40 MG tablet Take 40 mg by mouth 2 (two) times daily. Started in 2012.    . potassium chloride (K-DUR) 10 MEQ tablet TAKE 1 TABLET BY MOUTH AS NEEDED. TAKE WITH LASIX 30 tablet 11  . prochlorperazine (COMPAZINE) 10 MG tablet Take 1 tablet (10 mg total) by mouth every 6 (six) hours as needed (Nausea or vomiting). 30 tablet 1  . terconazole (TERAZOL 7) 0.4 % vaginal cream Place 1 applicator vaginally 2 (two) times daily as needed (irritation).     . ticagrelor (BRILINTA) 90 MG TABS tablet Take 1 tablet (90 mg total) by mouth 2 (two)  times daily. 60 tablet 0  . Vitamin D, Ergocalciferol, (DRISDOL) 50000 units CAPS capsule Take 1 capsule (50,000 Units total) by mouth every 7 (seven) days. 12 capsule 3  . zolpidem (AMBIEN) 10 MG tablet Take 10 mg by mouth at bedtime as needed for sleep.      No current facility-administered medications for this visit.       OBJECTIVE: Middle-aged white woman appears well  Vitals:   08/21/17 1437  BP: (!) 118/57  Pulse: 69  Resp: 18  Temp: 98.3 F (36.8 C)  SpO2: 95%     Body mass index is 22.16 kg/m.    ECOG FS:1 - Symptomatic but completely ambulatory  Wearing a very becoming weak--her here never really grew back after the Taxotere Sclerae unicteric, EOMs intact Oropharynx clear, tooth missing left lower jaw No cervical or supraclavicular adenopathy Lungs no rales or rhonchi Heart regular rate and rhythm Abd soft, nontender, positive bowel sounds MSK no focal spinal tenderness, no upper extremity lymphedema Neuro: nonfocal, well oriented, appropriate affect Breasts: The right breast is status post lumpectomy and radiation.  There is some thickening of the skin.  There is considerably less erythema, almost gone by now.  There is no evidence of local recurrence.  The left breast is benign.  Both axillae are benign.  Right breast 08/15/2016    Right Breast 05/29/2017      LAB RESULTS:  CMP     Component Value Date/Time   NA 140 05/29/2017 1338   NA 138 03/11/2017 1524   K 3.6 05/29/2017 1338   K 3.8 03/11/2017 1524   CL 105 05/29/2017 1338   CO2 26 05/29/2017 1338   CO2 25 03/11/2017 1524   GLUCOSE 97 05/29/2017 1338   GLUCOSE 136 03/11/2017 1524   BUN 13 05/29/2017 1338   BUN 13.2 03/11/2017 1524   CREATININE 0.84 05/29/2017 1338   CREATININE 0.8 03/11/2017 1524   CALCIUM 8.9 05/29/2017 1338   CALCIUM 9.6 03/11/2017 1524   PROT 6.7 05/29/2017 1338   PROT 7.6 03/11/2017 1524   ALBUMIN 3.6 05/29/2017 1338   ALBUMIN 3.8 03/11/2017 1524   AST 27 05/29/2017 1338   AST 24 03/11/2017 1524   ALT 32 05/29/2017 1338   ALT 38 03/11/2017 1524   ALKPHOS 106 05/29/2017 1338   ALKPHOS 166 (H) 03/11/2017 1524   BILITOT 0.6 05/29/2017 1338   BILITOT 0.44 03/11/2017 1524   GFRNONAA >60 05/29/2017 1338   GFRAA >60 05/29/2017 1338    INo results found for: SPEP, UPEP  Lab  Results  Component Value Date   WBC 9.8 08/21/2017   NEUTROABS 7.5 (H) 08/21/2017   HGB 13.3 08/21/2017   HCT 39.6 08/21/2017   MCV 95.7 08/21/2017   PLT 234 08/21/2017      Chemistry      Component Value Date/Time   NA 140 05/29/2017 1338   NA 138 03/11/2017 1524   K 3.6 05/29/2017 1338   K 3.8 03/11/2017 1524   CL 105 05/29/2017 1338   CO2 26 05/29/2017 1338   CO2 25 03/11/2017 1524   BUN 13 05/29/2017 1338   BUN 13.2 03/11/2017 1524   CREATININE 0.84 05/29/2017 1338   CREATININE 0.8 03/11/2017 1524      Component Value Date/Time   CALCIUM 8.9 05/29/2017 1338   CALCIUM 9.6 03/11/2017 1524   ALKPHOS 106 05/29/2017 1338   ALKPHOS 166 (H) 03/11/2017 1524   AST 27 05/29/2017 1338   AST 24 03/11/2017 1524  ALT 32 05/29/2017 1338   ALT 38 03/11/2017 1524   BILITOT 0.6 05/29/2017 1338   BILITOT 0.44 03/11/2017 1524       No results found for: LABCA2  No components found for: LABCA125  No results for input(s): INR in the last 168 hours.  Urinalysis    Component Value Date/Time   COLORURINE YELLOW 01/03/2017 1205   APPEARANCEUR HAZY (A) 01/03/2017 1205   LABSPEC 1.006 01/03/2017 1205   PHURINE 7.0 01/03/2017 1205   GLUCOSEU NEGATIVE 01/03/2017 1205   HGBUR SMALL (A) 01/03/2017 1205   BILIRUBINUR NEGATIVE 01/03/2017 1205   BILIRUBINUR n 10/01/2016 1316   KETONESUR NEGATIVE 01/03/2017 1205   PROTEINUR NEGATIVE 01/03/2017 1205   UROBILINOGEN 0.2 10/01/2016 1316   NITRITE NEGATIVE 01/03/2017 1205   LEUKOCYTESUR LARGE (A) 01/03/2017 1205      ELIGIBLE FOR AVAILABLE RESEARCH PROTOCOL: PALLAS, neuropathy study  STUDIES: Diagnostic Bilateral Breast Mammogram with TOMO, 01/30/2017, breast density category B, shows no evidence of malignancy.    ASSESSMENT: 72 y.o. [name] woman status post right breast upper outer quadrant and right axillary lymph node biopsy 08/23/2015, both positive for a clinical T1 N2, stage IIIA invasive ductal carcinoma, grade 2,  strongly estrogen receptor positive, 5% progesterone receptor positive, with an MIB-120-30% and no HER-2 amplification  (a) biopsy of a 0.9 cm Right breast satellite nodule 09/13/2015 showed invasive ductal carcinoma, grade 2, E-cadherin strongly positive, with a prognostic panel identical to the larger right upper quadrant mass  (1) left breast biopsy 08/24/2015 shows a complex sclerosing lesion, excised 12/05/2015  (2) neoadjuvant chemotherapy consisting of cyclophosphamide and docetaxel every 21 days 4, with OnPro support, started 09/12/2015, completed 11/14/2015  (3) status post right lumpectomy and sentinel lymph node dissection 12/05/2015 for a residual pT2 pN3, stage IIIC invasive ductal carcinoma, grade 2, with a close anterior margin (skin)  (4) adjuvant radiation 01/18/16 - 11/6/17with capecitabine chemo-sensitization   1) Right breast/nodal areas: 45 Gy in 25 fractions                         2) Right breast boost: 16 Gy in 8 fractions  (5) capecitabine started 04/13/2016, discontinued January 2018 with mild/moderate side effects developing  (6) anastrozole started 06/01/2016  (a) bone density January 30, 2017 shows a T score of -2.7 (osteoporosis)  (7) genetics testing (Askenazi descent) 02/07/2016 through the Breast/Ovarian Cancer Panel offered by [facility] Hope Pigeon, MD) i found no deleterious mutations in  ATM, BARD1, BRCA1, BRCA2, BRIP1, CDH1, CHEK2, FANCC, MLH1, MSH2, MSH6, NBN, PALB2, PMS2, PTEN, RAD51C, RAD51D, TP53, and XRCC2.  This panel also includes deletion/duplication analysis (without sequencing) for one gene, EPCAM.  (8) consented to PALLAS trial   (a) randomized to anastrozole alone I have reviewed the above documentation for accuracy and completeness, and I agree with the above.  PLAN: Bailyn will soon be 2 years out from definitive surgery for her breast cancer with no evidence of disease recurrence.  This is favorable.  She is tolerating  anastrozole well.  The plan will be to continue that a minimum of 5 years, more likely 7 years.  She will be due for repeat bone density August 2020  I commended her exercise program.  She is going to see me again in July as per protocol.  She knows to call for any issues that may develop before that visit.   Anik Wesch, Virgie Dad, MD  08/21/17 3:07 PM Medical Oncology and Hematology [facility]  [place], [place] [address] Tel. [phone]    Fax. [phone]  This document serves as a record of services personally performed by Lurline Del, MD. It was created on his behalf by Sheron Nightingale, a trained medical scribe. The creation of this record is based on the scribe's personal observations and the provider's statements to them.   I have reviewed the above documentation for accuracy and completeness, and I agree with the above.

## 2017-08-19 NOTE — Telephone Encounter (Signed)
Patient called to verify her appointment times

## 2017-08-21 ENCOUNTER — Inpatient Hospital Stay: Payer: Medicare Other | Attending: Oncology

## 2017-08-21 ENCOUNTER — Encounter (HOSPITAL_COMMUNITY): Payer: Self-pay

## 2017-08-21 ENCOUNTER — Inpatient Hospital Stay (HOSPITAL_BASED_OUTPATIENT_CLINIC_OR_DEPARTMENT_OTHER): Payer: Medicare Other | Admitting: Oncology

## 2017-08-21 ENCOUNTER — Inpatient Hospital Stay: Payer: Medicare Other | Admitting: *Deleted

## 2017-08-21 ENCOUNTER — Telehealth (HOSPITAL_COMMUNITY): Payer: Self-pay

## 2017-08-21 VITALS — BP 118/57 | HR 69 | Temp 98.3°F | Resp 18 | Wt 139.4 lb

## 2017-08-21 DIAGNOSIS — C773 Secondary and unspecified malignant neoplasm of axilla and upper limb lymph nodes: Secondary | ICD-10-CM | POA: Insufficient documentation

## 2017-08-21 DIAGNOSIS — M810 Age-related osteoporosis without current pathological fracture: Secondary | ICD-10-CM

## 2017-08-21 DIAGNOSIS — Z170 Estrogen receptor positive status [ER+]: Secondary | ICD-10-CM | POA: Diagnosis not present

## 2017-08-21 DIAGNOSIS — C50411 Malignant neoplasm of upper-outer quadrant of right female breast: Secondary | ICD-10-CM

## 2017-08-21 DIAGNOSIS — Z923 Personal history of irradiation: Secondary | ICD-10-CM | POA: Insufficient documentation

## 2017-08-21 DIAGNOSIS — Z79811 Long term (current) use of aromatase inhibitors: Secondary | ICD-10-CM | POA: Insufficient documentation

## 2017-08-21 DIAGNOSIS — Z9221 Personal history of antineoplastic chemotherapy: Secondary | ICD-10-CM

## 2017-08-21 DIAGNOSIS — Z006 Encounter for examination for normal comparison and control in clinical research program: Secondary | ICD-10-CM

## 2017-08-21 LAB — COMPREHENSIVE METABOLIC PANEL
ALK PHOS: 102 U/L (ref 40–150)
ALT: 30 U/L (ref 0–55)
ANION GAP: 8 (ref 3–11)
AST: 22 U/L (ref 5–34)
Albumin: 3.8 g/dL (ref 3.5–5.0)
BILIRUBIN TOTAL: 0.5 mg/dL (ref 0.2–1.2)
BUN: 11 mg/dL (ref 7–26)
CALCIUM: 9.3 mg/dL (ref 8.4–10.4)
CO2: 28 mmol/L (ref 22–29)
Chloride: 104 mmol/L (ref 98–109)
Creatinine, Ser: 0.89 mg/dL (ref 0.60–1.10)
GFR calc Af Amer: >60 mL/min (ref >60)
Glucose, Bld: 124 mg/dL (ref 70–140)
Potassium: 4.1 mmol/L (ref 3.5–5.1)
SODIUM: 140 mmol/L (ref 136–145)
TOTAL PROTEIN: 7 g/dL (ref 6.4–8.3)

## 2017-08-21 LAB — CBC WITH DIFFERENTIAL/PLATELET
BASOS ABS: 0.1 10*3/uL (ref 0.0–0.1)
BASOS PCT: 1 %
EOS ABS: 0.2 10*3/uL (ref 0.0–0.5)
Eosinophils Relative: 2 %
HEMATOCRIT: 39.6 % (ref 34.8–46.6)
HEMOGLOBIN: 13.3 g/dL (ref 11.6–15.9)
Lymphocytes Relative: 12 %
Lymphs Abs: 1.2 10*3/uL (ref 0.9–3.3)
MCH: 32.2 pg (ref 25.1–34.0)
MCHC: 33.7 g/dL (ref 31.5–36.0)
MCV: 95.7 fL (ref 79.5–101.0)
Monocytes Absolute: 0.8 10*3/uL (ref 0.1–0.9)
Monocytes Relative: 8 %
NEUTROS ABS: 7.5 10*3/uL — AB (ref 1.5–6.5)
NEUTROS PCT: 77 %
Platelets: 234 10*3/uL (ref 145–400)
RBC: 4.14 MIL/uL (ref 3.70–5.45)
RDW: 13.7 % (ref 11.2–14.5)
WBC: 9.8 10*3/uL (ref 3.9–10.3)

## 2017-08-21 NOTE — Telephone Encounter (Signed)
Patient called and stated she will not be making it to class today as she has a sore throat.

## 2017-08-21 NOTE — Progress Notes (Signed)
08/21/2017 Patient in to clinic today for Cycle 15 visit assessments; note that clinic visit was delayed due to patient and MD scheduling conflicts. Patient returned her completed drug diaries for Cycles 12-14 for anti-hormone therapy, indicating one missed dose during Cycle 13. Patient was given drug diaries to complete for Cycles 15-17. She reports no changes in ongoing previously reported AEs. See MD note for additional details. Based on lab results review and H&P by Dr. Jana Hakim, patient condition is acceptable for continued treatment on study. Plans made for return visit in July 2019. Cindy S. Brigitte Pulse BSN, RN, [place] 08/21/2017 3:55 PM  Adverse Event Log Protocol: AFT-05 PALLAS Cycles 12-14: 05/30/2017 - 08/21/2017(cycle end date = 08/13/2017) Baseline AEs - improved to grade 1: neuropathy (10/12/16) and fatigue (08/15/16) Event Grade Onset Date Resolved Date Attribution to anastrozole Treatment/ Comments  Vaginal dryness Grade 1 08/01/16 ongoing Yes   Urinary incontinence- stress  Grade 1  08/15/16  ongoing  No   Atrophic vaginitis Grade 2 09/13/16 ongoing Yes Vitamin E vaginal suppositories  Sinus bradycardia, int. Grade 1 12/31/16 ongoing No   LV diastolic dysfunction Grade 1 01/02/17 ongoing No   Hyperlipidemia Grade 2 01/02/17 ongoing Yes Statin started 01/01/17 for ACS; Zetia switched to Crestor  Chronic diastolic CHF Grade 2 2/83/15 ongoing No Lasix with prophylactic K-dur  Weight loss Grade 2 03/11/17 ongoing No   Bruising Grade 1 04/01/17 ongoing No Related to Brilinta  Gum swelling Grade 2 07/03/2017 08/14/2017 No Related to tooth implant removal  Cindy S. Brigitte Pulse BSN, RN, [place] 08/26/2017 2:44 PM

## 2017-08-23 ENCOUNTER — Telehealth (HOSPITAL_COMMUNITY): Payer: Self-pay

## 2017-08-23 ENCOUNTER — Encounter (HOSPITAL_COMMUNITY): Payer: Self-pay

## 2017-08-23 NOTE — Telephone Encounter (Signed)
Patient called and stated she will not be attending class today as she has a sinus infection.

## 2017-08-26 ENCOUNTER — Encounter (HOSPITAL_COMMUNITY)
Admission: RE | Admit: 2017-08-26 | Discharge: 2017-08-26 | Disposition: A | Discharge status: Home / Self Care | Payer: Self-pay | Source: Ambulatory Visit | Attending: Interventional Cardiology | Admitting: Interventional Cardiology

## 2017-08-28 ENCOUNTER — Encounter (HOSPITAL_COMMUNITY)
Admission: RE | Admit: 2017-08-28 | Discharge: 2017-08-28 | Disposition: A | Discharge status: Home / Self Care | Payer: Self-pay | Source: Ambulatory Visit | Attending: Interventional Cardiology | Admitting: Interventional Cardiology

## 2017-08-28 DIAGNOSIS — I214 Non-ST elevation (NSTEMI) myocardial infarction: Secondary | ICD-10-CM | POA: Insufficient documentation

## 2017-08-28 DIAGNOSIS — Z955 Presence of coronary angioplasty implant and graft: Secondary | ICD-10-CM | POA: Insufficient documentation

## 2017-08-30 ENCOUNTER — Encounter (HOSPITAL_COMMUNITY): Payer: Self-pay

## 2017-09-02 ENCOUNTER — Encounter (HOSPITAL_COMMUNITY)
Admission: RE | Admit: 2017-09-02 | Discharge: 2017-09-02 | Disposition: A | Discharge status: Home / Self Care | Payer: Self-pay | Source: Ambulatory Visit | Attending: Interventional Cardiology | Admitting: Interventional Cardiology

## 2017-09-04 ENCOUNTER — Encounter (HOSPITAL_COMMUNITY)
Admission: RE | Admit: 2017-09-04 | Discharge: 2017-09-04 | Disposition: A | Discharge status: Home / Self Care | Payer: Self-pay | Source: Ambulatory Visit | Attending: Interventional Cardiology | Admitting: Interventional Cardiology

## 2017-09-06 ENCOUNTER — Encounter (HOSPITAL_COMMUNITY): Payer: Self-pay

## 2017-09-09 ENCOUNTER — Encounter (HOSPITAL_COMMUNITY): Payer: Self-pay

## 2017-09-11 ENCOUNTER — Encounter (HOSPITAL_COMMUNITY): Payer: Self-pay

## 2017-09-13 ENCOUNTER — Encounter (HOSPITAL_COMMUNITY): Payer: Self-pay

## 2017-09-16 ENCOUNTER — Encounter (HOSPITAL_COMMUNITY)
Admission: RE | Admit: 2017-09-16 | Discharge: 2017-09-16 | Disposition: A | Discharge status: Home / Self Care | Payer: Self-pay | Source: Ambulatory Visit | Attending: Interventional Cardiology | Admitting: Interventional Cardiology

## 2017-09-17 ENCOUNTER — Encounter: Payer: Self-pay | Admitting: Obstetrics & Gynecology

## 2017-09-17 ENCOUNTER — Ambulatory Visit (INDEPENDENT_AMBULATORY_CARE_PROVIDER_SITE_OTHER): Payer: Medicare Other | Admitting: Obstetrics & Gynecology

## 2017-09-17 ENCOUNTER — Encounter

## 2017-09-17 VITALS — BP 108/76 | HR 56 | Resp 16 | Ht 62.0 in | Wt 141.8 lb

## 2017-09-17 DIAGNOSIS — Z01419 Encounter for gynecological examination (general) (routine) without abnormal findings: Secondary | ICD-10-CM | POA: Diagnosis not present

## 2017-09-17 DIAGNOSIS — N760 Acute vaginitis: Secondary | ICD-10-CM | POA: Diagnosis not present

## 2017-09-17 DIAGNOSIS — R3915 Urgency of urination: Secondary | ICD-10-CM

## 2017-09-17 DIAGNOSIS — Z124 Encounter for screening for malignant neoplasm of cervix: Secondary | ICD-10-CM | POA: Diagnosis not present

## 2017-09-17 NOTE — Progress Notes (Signed)
72 y.o. [id number] MarriedCaucasianF here for annual exam.  Had MI on Labor Day last year.  Had cardiac cath.  Had four stents placed.  Did cardiac rehab.  Has chosen to continue cardiac rehab.  Has follow up with Dr. Irish Lack will be next month.    Still using Vit E suppositories and Replens.  Has not had a UTI in several months.  Did see Dr. Leonie Green in September.  Using low level laser light for treatment of alopecia.  Stem cell injections a possibility in note from Dr. Leonie Green.  Pt really doesn't know that much about this option.  Denies vaginal bleeding.    Patient's last menstrual period was 04/30/1996 (approximate).          Sexually active: Yes.    The current method of family planning is post menopausal status.    Exercising: Yes.    Gym Smoker:  no  Health Maintenance: Pap:  09/07/16 Neg.   02/06/12 Neg  History of abnormal Pap:  no  MMG:  01/30/17 Diagnostic Bilateral BIRADS2:Benign. F/u 1 year  Colonoscopy:  01/01/14 f/u 5 years  BMD:   01/30/17 Osteopenia  TDaP:  2016 Pneumonia vaccine(s):  2015 Shingrix:   n/a Hep C testing: No Screening Labs: PCP   reports that she has quit smoking. Her smoking use included cigarettes. She has a 3.75 pack-year smoking history. She has never used smokeless tobacco. She reports that she drinks alcohol. She reports that she does not use drugs.  Past Medical History:  Diagnosis Date  . Anxiety   . Arthritis 2008  . Asthma 1953   age 41  . Breast cancer ([facility])   . Cancer [facility])    breast cancer  . Chronic vaginitis 1998  . Fibromyalgia 1982  . GERD (gastroesophageal reflux disease)    since at least 2011  . Hemorrhoids 2009  . History of radiation therapy 01/18/16-03/05/16   right breast/nodal areas 45 Gy, right breast boost 16 Gy  . Hypoglycemia 2003  . Hypothyroidism 1964   age 11  . Interstitial cystitis 1998  . Irritable bowel syndrome 06/14/2009   Qualifier: Diagnosis of  By: Patsy Baltimore RN, Langley Gauss    . Menopausal symptoms   .  Osteoporosis 12/24/2006  . Personal history of chemotherapy   . Personal history of radiation therapy   . Pneumonia 1979   varicella  . PONV (postoperative nausea and vomiting) 1979    Past Surgical History:  Procedure Laterality Date  . BLADDER HYPEREXTENSION     X4, prior to 01/2006  . BLADDER SUSPENSION    . BREAST BIOPSY    . BREAST LUMPECTOMY Right    2017  . BREAST LUMPECTOMY WITH NEEDLE LOCALIZATION AND AXILLARY LYMPH NODE DISSECTION Right 12/05/2015   Procedure: RIGHT BREAST NEEDLE LOCALIZED (WIRES X 2) LUMPECTOMY WITH AXILLARY LYMPH NODE DISSECTION;  Surgeon: Fanny Skates, MD;  Location: [place];  Service: General;  Laterality: Right;  . BREAST LUMPECTOMY WITH RADIOACTIVE SEED LOCALIZATION Left 12/05/2015   Procedure: RADIOACTIVE SEED GUIDED LEFT BREAST LUMPECTOMY;  Surgeon: Fanny Skates, MD;  Location: [place];  Service: General;  Laterality: Left;  . BREAST SURGERY    . BUNIONECTOMY     prior to 01/2006  . CARDIAC CATHETERIZATION    . CESAREAN SECTION  1979  . CHOLECYSTECTOMY  02/08/2006   DR. INGRAM  . COLONOSCOPY    . CORONARY STENT INTERVENTION N/A 01/01/2017   Procedure: CORONARY STENT INTERVENTION;  Surgeon: Jettie Booze, MD;  Location: [place]  CV LAB;  Service: Cardiovascular;  Laterality: N/A;  . DENTAL SURGERY  2009-2016   13 dental implants  . LEFT HEART CATH AND CORONARY ANGIOGRAPHY N/A 01/01/2017   Procedure: LEFT HEART CATH AND CORONARY ANGIOGRAPHY;  Surgeon: Jettie Booze, MD;  Location: [place] CV [facility];  Service: Cardiovascular;  Laterality: N/A;  . LEG SURGERY  07/06/2004   Broken tibia  . NASAL SINUS SURGERY     X4; prior to 01/2006  . PORT-A-CATH REMOVAL Right 12/05/2015   Procedure: REMOVAL PORT-A-CATH;  Surgeon: Fanny Skates, MD;  Location: [place];  Service: General;  Laterality: Right;  . PORTACATH PLACEMENT Left 09/08/2015   Procedure: INSERTION OF PORT-A-CATH ;  Surgeon: Fanny Skates, MD;  Location: [place];  Service: General;   Laterality: Left;    Current Outpatient Medications  Medication Sig Dispense Refill  . albuterol (PROVENTIL HFA;VENTOLIN HFA) 108 (90 Base) MCG/ACT inhaler Inhale 1-2 puffs into the lungs every 6 (six) hours as needed for wheezing or shortness of breath. Started in 1998    . anastrozole (ARIMIDEX) 1 MG tablet Take 1 tablet (1 mg total) by mouth daily. 90 tablet 4  . aspirin EC 81 MG EC tablet Take 1 tablet (81 mg total) by mouth daily. 30 tablet 0  . buPROPion (WELLBUTRIN XL) 300 MG 24 hr tablet Take 300 mg by mouth daily.     . Calcium Carbonate-Vitamin D (CALCIUM + D PO) Take 1 tablet by mouth at bedtime. Started approximately 1998    . carisoprodol (SOMA) 350 MG tablet Take 350 mg by mouth 2 (two) times daily as needed for muscle spasms. Started in 2008.  2  . diazepam (VALIUM) 5 MG tablet Take 5 mg by mouth every 6 (six) hours as needed for muscle spasms (interstitial cystitis). Started approximately 2008.    . furosemide (LASIX) 20 MG tablet Take 1 tablet (20 mg total) by mouth daily as needed (shortness of breath). 30 tablet 3  . HYDROcodone-acetaminophen (NORCO) 10-325 MG tablet Take 1 tablet by mouth every 4 (four) hours as needed for moderate pain or severe pain (as needed for fibromyalgia and IC).     . hydrOXYzine (ATARAX/VISTARIL) 25 MG tablet Take 1 tablet by mouth at bedtime.    Marland Kitchen levothyroxine (SYNTHROID, LEVOTHROID) 88 MCG tablet Take 1 tablet by mouth daily.  1  . LORazepam (ATIVAN) 2 MG tablet Take 1 tablet by mouth daily as needed for anxiety.   5  . metoprolol succinate (TOPROL-XL) 25 MG 24 hr tablet TAKE 1/2 TABLET(12.5 MG) BY MOUTH DAILY 15 tablet 3  . minoxidil (ROGAINE) 2 % external solution Apply topically 2 (two) times daily.    . NONFORMULARY OR COMPOUNDED ITEM Place 1 suppository vaginally daily. compounded medication - vitamin E    . ondansetron (ZOFRAN) 4 MG tablet Take 1 tablet by mouth daily as needed for nausea or vomiting.   3  . pantoprazole (PROTONIX) 40 MG  tablet Take 40 mg by mouth 2 (two) times daily. Started in 2012.    . potassium chloride (K-DUR) 10 MEQ tablet TAKE 1 TABLET BY MOUTH AS NEEDED. TAKE WITH LASIX 30 tablet 11  . prochlorperazine (COMPAZINE) 10 MG tablet Take 1 tablet (10 mg total) by mouth every 6 (six) hours as needed (Nausea or vomiting). 30 tablet 1  . rosuvastatin (CRESTOR) 10 MG tablet Take 1 tablet (10 mg total) by mouth daily.    Marland Kitchen terconazole (TERAZOL 7) 0.4 % vaginal cream Place 1 applicator vaginally 2 (two)  times daily as needed (irritation).     . ticagrelor (BRILINTA) 90 MG TABS tablet Take 1 tablet (90 mg total) by mouth 2 (two) times daily. 60 tablet 0  . Vaginal Lubricant (REPLENS VA) Place vaginally daily.    . Vitamin D, Ergocalciferol, (DRISDOL) 50000 units CAPS capsule Take 1 capsule (50,000 Units total) by mouth every 7 (seven) days. 12 capsule 3  . zolpidem (AMBIEN) 10 MG tablet Take 10 mg by mouth at bedtime as needed for sleep.     . nitroGLYCERIN (NITROSTAT) 0.4 MG SL tablet Place 1 tablet (0.4 mg total) under the tongue every 5 (five) minutes as needed for chest pain. 25 tablet 3   No current facility-administered medications for this visit.     Family History  Problem Relation Age of Onset  . Hypertension Mother   . Heart disease Mother        d. 38  . Parkinsonism Father        d. 73  . Heart disease Sister   . Breast cancer Sister 67       s/p mastectomy; reportedly negative BRCA1/2 testing approx 7 years ago  . Melanoma Sister        d. 43s; hx of incresed sun exposure  . Cancer Sister        dx. sarcoma of her face in her 82s; treated w/ MOHS surgery  . Parkinsonism Paternal Aunt        d. 90s  . Parkinsonism Paternal Uncle        d. late 43s  . Kidney failure Maternal Grandmother 53  . Heart attack Maternal Grandfather        d. 70s  . Heart disease Paternal Grandmother        d. 35  . Breast cancer Other 80       maternal great aunt (MGF's sister)  . Kidney cancer Paternal Uncle  86       d. 34s; unsure about smoking status    Review of Systems  All other systems reviewed and are negative.   Exam:   BP 108/76 (BP Location: Right Arm, Patient Position: Sitting, Cuff Size: Normal)   Pulse (!) 56   Resp 16   Ht _0  (1.575 m)   Wt 141 lb 12.8 oz (64.3 kg)   LMP 04/30/1996 (Approximate)   BMI 25.94 kg/m    Height: _1  (157.5 cm)  Ht Readings from Last 3 Encounters:  09/17/17 _2  (1.575 m)  06/27/17 5' 6.5" (1.689 m)  05/30/17 _3  (1.6 m)    General appearance: alert, cooperative and appears stated age Head: Normocephalic, without obvious abnormality, atraumatic Neck: no adenopathy, supple, symmetrical, trachea midline and thyroid normal to inspection and palpation Lungs: clear to auscultation bilaterally Breasts: right breast with minimal erythema, improved softness from prior exam, still with firmness of most of breast c/w radiation changes, no LAD, no masses; left breast without masses, skin changes, LAD, nipple discharge Heart: regular rate and rhythm Abdomen: soft, non-tender; bowel sounds normal; no masses,  no organomegaly Extremities: extremities normal, atraumatic, no cyanosis or edema Skin: Skin color, texture, turgor normal. No rashes or lesions Lymph nodes: Cervical, supraclavicular, and axillary nodes normal. No abnormal inguinal nodes palpated Neurologic: Grossly normal  Pelvic: External genitalia:  no lesions              Urethra:  normal appearing urethra with no masses, tenderness or lesions  Bartholins and Skenes: normal                 Vagina: normal appearing vagina with normal color and discharge, no lesions              Cervix: no lesions              Pap taken: No. Bimanual Exam:  Uterus:  normal size, contour, position, consistency, mobility, non-tender              Adnexa: normal adnexa and no mass, fullness, tenderness               Rectovaginal: Confirms               Anus:  normal sphincter tone, no  lesions  Chaperone was present for exam.  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 Lois Huxley at [place] Interstitial cystitis  P:   Mammogram guidelines reviewed.  Doing diagnostic MMGs. pap smear 2018 was negative.  Not indicated today. Urine culture pending from today RF for Vit E vaginal suppositories 259m/ml pv three times weekliy.  #36/4RF return annually or prn

## 2017-09-18 ENCOUNTER — Encounter (HOSPITAL_COMMUNITY)
Admission: RE | Admit: 2017-09-18 | Discharge: 2017-09-18 | Disposition: A | Discharge status: Home / Self Care | Payer: Self-pay | Source: Ambulatory Visit | Attending: Interventional Cardiology | Admitting: Interventional Cardiology

## 2017-09-19 LAB — URINE CULTURE

## 2017-09-20 ENCOUNTER — Encounter (HOSPITAL_COMMUNITY)
Admission: RE | Admit: 2017-09-20 | Discharge: 2017-09-20 | Disposition: A | Discharge status: Home / Self Care | Payer: Self-pay | Source: Ambulatory Visit | Attending: Interventional Cardiology | Admitting: Interventional Cardiology

## 2017-09-20 MED ORDER — NONFORMULARY OR COMPOUNDED ITEM: 4 refills | Status: DC

## 2017-09-25 ENCOUNTER — Encounter (HOSPITAL_COMMUNITY): Payer: Self-pay

## 2017-09-27 ENCOUNTER — Encounter (HOSPITAL_COMMUNITY): Payer: Self-pay

## 2017-09-30 ENCOUNTER — Other Ambulatory Visit: Payer: Self-pay | Admitting: Cardiology

## 2017-09-30 ENCOUNTER — Other Ambulatory Visit: Payer: Self-pay | Admitting: Interventional Cardiology

## 2017-09-30 ENCOUNTER — Encounter (HOSPITAL_COMMUNITY)
Admission: RE | Admit: 2017-09-30 | Discharge: 2017-09-30 | Disposition: A | Discharge status: Home / Self Care | Payer: Self-pay | Source: Ambulatory Visit | Attending: Interventional Cardiology | Admitting: Interventional Cardiology

## 2017-09-30 DIAGNOSIS — Z955 Presence of coronary angioplasty implant and graft: Secondary | ICD-10-CM | POA: Insufficient documentation

## 2017-09-30 DIAGNOSIS — I214 Non-ST elevation (NSTEMI) myocardial infarction: Secondary | ICD-10-CM | POA: Insufficient documentation

## 2017-10-02 ENCOUNTER — Encounter (HOSPITAL_COMMUNITY)
Admission: RE | Admit: 2017-10-02 | Discharge: 2017-10-02 | Disposition: A | Discharge status: Home / Self Care | Payer: Self-pay | Source: Ambulatory Visit | Attending: Interventional Cardiology | Admitting: Interventional Cardiology

## 2017-10-04 ENCOUNTER — Encounter (HOSPITAL_COMMUNITY): Payer: Self-pay

## 2017-10-07 ENCOUNTER — Encounter (HOSPITAL_COMMUNITY)
Admission: RE | Admit: 2017-10-07 | Discharge: 2017-10-07 | Disposition: A | Discharge status: Home / Self Care | Payer: Self-pay | Source: Ambulatory Visit | Attending: Interventional Cardiology | Admitting: Interventional Cardiology

## 2017-10-09 ENCOUNTER — Encounter (HOSPITAL_COMMUNITY)
Admission: RE | Admit: 2017-10-09 | Discharge: 2017-10-09 | Disposition: A | Discharge status: Home / Self Care | Payer: Self-pay | Source: Ambulatory Visit | Attending: Interventional Cardiology | Admitting: Interventional Cardiology

## 2017-10-10 ENCOUNTER — Other Ambulatory Visit: Payer: Self-pay | Admitting: Obstetrics & Gynecology

## 2017-10-10 NOTE — Telephone Encounter (Signed)
Medication refill request: vitamin D  Last AEX:  09-17-17  Next AEX: 12-23-18  Last MMG (if hormonal medication request): 01-30-17 WNL  Refill authorized: please advise

## 2017-10-11 ENCOUNTER — Encounter (HOSPITAL_COMMUNITY): Payer: Self-pay

## 2017-10-14 ENCOUNTER — Encounter (HOSPITAL_COMMUNITY)
Admission: RE | Admit: 2017-10-14 | Discharge: 2017-10-14 | Disposition: A | Discharge status: Home / Self Care | Payer: Self-pay | Source: Ambulatory Visit | Attending: Interventional Cardiology | Admitting: Interventional Cardiology

## 2017-10-16 ENCOUNTER — Encounter (HOSPITAL_COMMUNITY)
Admission: RE | Admit: 2017-10-16 | Discharge: 2017-10-16 | Disposition: A | Discharge status: Home / Self Care | Payer: Medicare Other | Source: Ambulatory Visit | Attending: Interventional Cardiology | Admitting: Interventional Cardiology

## 2017-10-18 ENCOUNTER — Encounter (HOSPITAL_COMMUNITY)
Admission: RE | Admit: 2017-10-18 | Discharge: 2017-10-18 | Disposition: A | Discharge status: Home / Self Care | Payer: Self-pay | Source: Ambulatory Visit | Attending: Interventional Cardiology | Admitting: Interventional Cardiology

## 2017-10-21 ENCOUNTER — Encounter (HOSPITAL_COMMUNITY): Payer: Self-pay

## 2017-10-23 ENCOUNTER — Encounter (HOSPITAL_COMMUNITY)
Admission: RE | Admit: 2017-10-23 | Discharge: 2017-10-23 | Disposition: A | Discharge status: Home / Self Care | Payer: Medicare Other | Source: Ambulatory Visit | Attending: Interventional Cardiology | Admitting: Interventional Cardiology

## 2017-10-23 ENCOUNTER — Other Ambulatory Visit: Payer: Self-pay | Admitting: Oncology

## 2017-10-23 DIAGNOSIS — C50411 Malignant neoplasm of upper-outer quadrant of right female breast: Secondary | ICD-10-CM

## 2017-10-23 DIAGNOSIS — Z170 Estrogen receptor positive status [ER+]: Principal | ICD-10-CM

## 2017-10-24 ENCOUNTER — Other Ambulatory Visit: Payer: Self-pay | Admitting: *Deleted

## 2017-10-24 DIAGNOSIS — Z170 Estrogen receptor positive status [ER+]: Principal | ICD-10-CM

## 2017-10-24 DIAGNOSIS — C50411 Malignant neoplasm of upper-outer quadrant of right female breast: Secondary | ICD-10-CM

## 2017-10-25 ENCOUNTER — Encounter (HOSPITAL_COMMUNITY): Payer: Self-pay

## 2017-10-27 NOTE — Progress Notes (Signed)
Cardiology Office Note   Date:  10/28/2017   ID:  [name], DOB [birth year], MRN [id number]  PCP:  Seward Carol, MD    No chief complaint on file.  CAD  Wt Readings from Last 3 Encounters:  10/28/17 141 lb 6.4 oz (64.1 kg)  09/17/17 141 lb 12.8 oz (64.3 kg)  08/21/17 139 lb 6.4 oz (63.2 kg)       History of Present Illness: [name] is a 72 y.o. female  with history of CAD status postNSTEMI9/4/18 treated with DES 4 to the circumflex, mid LAD, mid RCA, and distal RCA  Patient has history of hypertension, breast CA status post lumpectomy chemotherapy and radiation, anxiety, fibromyalgia and hypothyroidism.  Seen on 01/18/17 with shortness of breath and BNP was up to 415. She was given some Lasix to take for a week and breathing improved. She thought it could be asthma related also. 2-D echo 01/02/17 showed normal LVEF 60-65% with grade 1 DD.She wen to [place].  Denies : Chest pain. Dizziness. Leg edema. Nitroglycerin use. Orthopnea. Palpitations. Paroxysmal nocturnal dyspnea. Shortness of breath. Syncope.   She is trying to exercise.  She does cardiac rehab maintenance phase.  SHe eats healthy.  No cardiac sx with exertion.   Past Medical History:  Diagnosis Date  . Anxiety   . Arthritis 2008  . Asthma 1953   age 65  . Breast cancer ([place])   . Cancer [facility])    breast cancer  . Chronic vaginitis 1998  . Fibromyalgia 1982  . GERD (gastroesophageal reflux disease)    since at least 2011  . Hemorrhoids 2009  . History of radiation therapy 01/18/16-03/05/16   right breast/nodal areas 45 Gy, right breast boost 16 Gy  . Hypoglycemia 2003  . Hypothyroidism 1964   age 65  . Interstitial cystitis 1998  . Irritable bowel syndrome 06/14/2009   Qualifier: Diagnosis of  By: Patsy Baltimore RN, Langley Gauss    . Menopausal symptoms   . Osteoporosis 12/24/2006  . Personal history of chemotherapy   . Personal history of radiation therapy   . Pneumonia 1979   varicella  .  PONV (postoperative nausea and vomiting) 1979    Past Surgical History:  Procedure Laterality Date  . BLADDER HYPEREXTENSION     X4, prior to 01/2006  . BLADDER SUSPENSION    . BREAST BIOPSY    . BREAST LUMPECTOMY Right    2017  . BREAST LUMPECTOMY WITH NEEDLE LOCALIZATION AND AXILLARY LYMPH NODE DISSECTION Right 12/05/2015   Procedure: RIGHT BREAST NEEDLE LOCALIZED (WIRES X 2) LUMPECTOMY WITH AXILLARY LYMPH NODE DISSECTION;  Surgeon: Fanny Skates, MD;  Location: [place];  Service: General;  Laterality: Right;  . BREAST LUMPECTOMY WITH RADIOACTIVE SEED LOCALIZATION Left 12/05/2015   Procedure: RADIOACTIVE SEED GUIDED LEFT BREAST LUMPECTOMY;  Surgeon: Fanny Skates, MD;  Location: [place];  Service: General;  Laterality: Left;  . BREAST SURGERY    . BUNIONECTOMY     prior to 01/2006  . CARDIAC CATHETERIZATION    . CESAREAN SECTION  1979  . CHOLECYSTECTOMY  02/08/2006   DR. INGRAM  . COLONOSCOPY    . CORONARY STENT INTERVENTION N/A 01/01/2017   Procedure: CORONARY STENT INTERVENTION;  Surgeon: Jettie Booze, MD;  Location: [place] [facility];  Service: Cardiovascular;  Laterality: N/A;  . DENTAL SURGERY  2009-2016   13 dental implants  . LEFT HEART CATH AND CORONARY ANGIOGRAPHY N/A 01/01/2017   Procedure: LEFT HEART CATH AND CORONARY  ANGIOGRAPHY;  Surgeon: Jettie Booze, MD;  Location: [place] [facility];  Service: Cardiovascular;  Laterality: N/A;  . LEG SURGERY  07/06/2004   Broken tibia  . NASAL SINUS SURGERY     X4; prior to 01/2006  . PORT-A-CATH REMOVAL Right 12/05/2015   Procedure: REMOVAL PORT-A-CATH;  Surgeon: Fanny Skates, MD;  Location: [place];  Service: General;  Laterality: Right;  . PORTACATH PLACEMENT Left 09/08/2015   Procedure: INSERTION OF PORT-A-CATH ;  Surgeon: Fanny Skates, MD;  Location: [place];  Service: General;  Laterality: Left;     Current Outpatient Medications  Medication Sig Dispense Refill  . albuterol (PROVENTIL HFA;VENTOLIN HFA) 108 (90  Base) MCG/ACT inhaler Inhale 1-2 puffs into the lungs every 6 (six) hours as needed for wheezing or shortness of breath. Started in 1998    . anastrozole (ARIMIDEX) 1 MG tablet Take 1 tablet (1 mg total) by mouth daily. 90 tablet 4  . aspirin EC 81 MG EC tablet Take 1 tablet (81 mg total) by mouth daily. 30 tablet 0  . BRILINTA 90 MG TABS tablet TAKE 1 TABLET(90 MG) BY MOUTH TWICE DAILY 60 tablet 9  . buPROPion (WELLBUTRIN XL) 300 MG 24 hr tablet Take 300 mg by mouth daily.     . Calcium Carbonate-Vitamin D (CALCIUM + D PO) Take 1 tablet by mouth at bedtime. Started approximately 1998    . carisoprodol (SOMA) 350 MG tablet Take 350 mg by mouth 2 (two) times daily as needed for muscle spasms. Started in 2008.  2  . diazepam (VALIUM) 5 MG tablet Take 5 mg by mouth every 6 (six) hours as needed for muscle spasms (interstitial cystitis). Started approximately 2008.    . furosemide (LASIX) 20 MG tablet Take 1 tablet (20 mg total) by mouth daily as needed (shortness of breath). 30 tablet 3  . HYDROcodone-acetaminophen (NORCO) 10-325 MG tablet Take 1 tablet by mouth every 4 (four) hours as needed for moderate pain or severe pain (as needed for fibromyalgia and IC).     . hydrOXYzine (ATARAX/VISTARIL) 25 MG tablet Take 1 tablet by mouth at bedtime.    Marland Kitchen levothyroxine (SYNTHROID, LEVOTHROID) 75 MCG tablet Take 75 mcg by mouth daily.  2  . LORazepam (ATIVAN) 2 MG tablet Take 1 tablet by mouth daily as needed for anxiety.   5  . metoprolol succinate (TOPROL-XL) 25 MG 24 hr tablet TAKE 1/2 TABLET(12.5 MG) BY MOUTH DAILY 45 tablet 3  . minoxidil (ROGAINE) 2 % external solution Apply topically 2 (two) times daily.    . NONFORMULARY OR COMPOUNDED ITEM Vit E vaginal suppositories 200mg /ml.  One pv three times weekly.  #36/4RF 36 each 4  . ondansetron (ZOFRAN) 4 MG tablet Take 1 tablet by mouth daily as needed for nausea or vomiting.   3  . pantoprazole (PROTONIX) 40 MG tablet Take 40 mg by mouth 2 (two) times  daily. Started in 2012.    . potassium chloride (K-DUR) 10 MEQ tablet TAKE 1 TABLET BY MOUTH AS NEEDED. TAKE WITH LASIX 30 tablet 11  . prochlorperazine (COMPAZINE) 10 MG tablet Take 1 tablet (10 mg total) by mouth every 6 (six) hours as needed (Nausea or vomiting). 30 tablet 1  . rosuvastatin (CRESTOR) 10 MG tablet Take 1 tablet (10 mg total) by mouth daily.    Marland Kitchen terconazole (TERAZOL 7) 0.4 % vaginal cream Place 1 applicator vaginally 2 (two) times daily as needed (irritation).     . Vaginal Lubricant (REPLENS VA)  Place vaginally daily.    . Vitamin D, Ergocalciferol, (DRISDOL) 50000 units CAPS capsule TAKE 1 CAPSULE BY MOUTH EVERY 7 DAYS 12 capsule 4  . zolpidem (AMBIEN) 10 MG tablet Take 10 mg by mouth at bedtime as needed for sleep.     . nitroGLYCERIN (NITROSTAT) 0.4 MG SL tablet Place 1 tablet (0.4 mg total) under the tongue every 5 (five) minutes as needed for chest pain. 25 tablet 3   No current facility-administered medications for this visit.     Allergies:   Chlorhexidine and Other    Social History:  The patient  reports that she has quit smoking. Her smoking use included cigarettes. She has a 3.75 pack-year smoking history. She has never used smokeless tobacco. She reports that she drinks alcohol. She reports that she does not use drugs.   Family History:  The patient's family history includes Breast cancer (age of onset: 86) in her sister; Breast cancer (age of onset: 71) in her other; Cancer in her sister; Heart attack in her maternal grandfather; Heart disease in her mother, paternal grandmother, and sister; Hypertension in her mother; Kidney cancer (age of onset: 68) in her paternal uncle; Kidney failure (age of onset: 78) in her maternal grandmother; Melanoma in her sister; Parkinsonism in her father, paternal aunt, and paternal uncle.    ROS:  Please see the history of present illness.   Otherwise, review of systems are positive for hair loss.   All other systems are reviewed  and negative.    PHYSICAL EXAM: VS:  BP 90/62   Pulse 60   Ht 5\' 2"  (1.575 m)   Wt 141 lb 6.4 oz (64.1 kg)   LMP 04/30/1996 (Approximate)   SpO2 98%   BMI 25.86 kg/m  , BMI Body mass index is 25.86 kg/m. GEN: Well nourished, well developed, in no acute distress  HEENT: normal  Neck: no JVD, carotid bruits, or masses Cardiac: RRR; no murmurs, rubs, or gallops,no edema  Respiratory:  clear to auscultation bilaterally, normal work of breathing GI: soft, nontender, nondistended, + BS MS: no deformity or atrophy  Skin: warm and dry, no rash Neuro:  Strength and sensation are intact Psych: euthymic mood, full affect    Recent Labs: 01/02/2017: TSH 1.325 01/18/2017: NT-Pro BNP 415 08/21/2017: ALT 30; BUN 11; Creatinine, Ser 0.89; Hemoglobin 13.3; Platelets 234; Potassium 4.1; Sodium 140   Lipid Panel    Component Value Date/Time   CHOL 189 01/03/2017 0402   TRIG 83 01/03/2017 0402   HDL 67 01/03/2017 0402   CHOLHDL 2.8 01/03/2017 0402   VLDL 17 01/03/2017 0402   LDLCALC 105 (H) 01/03/2017 0402     Other studies Reviewed: Additional studies/ records that were reviewed today with results demonstrating: LDL 89 in 12/18.   ASSESSMENT AND PLAN:  1. CAD: No angina on secondary prevention.  Plan to switch to Plavix from [place] in 10/19; after she returns from [place]. 2. HTN: The current medical regimen is effective;  continue present plan and medications. 3. Chronic diastolic heart failure: Appears euvolemic. 4. Hyperlipidemia: Increase Crestor to 20 mg daily.  Recheck lipids/liver in 3 months.   Current medicines are reviewed at length with the patient today.  The patient concerns regarding her medicines were addressed.  The following changes have been made:  No change  Labs/ tests ordered today include:  No orders of the defined types were placed in this encounter.   Recommend 150 minutes/week of aerobic exercise Low fat, low carb,  high fiber diet  recommended  Disposition:   FU in 1 year   Signed, Larae Grooms, MD  10/28/2017 3:10 PM    [facility] [address], [place], [place]  [address] Phone: [phone]; Fax: [phone]

## 2017-10-28 ENCOUNTER — Encounter: Payer: Self-pay | Admitting: Interventional Cardiology

## 2017-10-28 ENCOUNTER — Ambulatory Visit (INDEPENDENT_AMBULATORY_CARE_PROVIDER_SITE_OTHER): Payer: Medicare Other | Admitting: Interventional Cardiology

## 2017-10-28 ENCOUNTER — Encounter (HOSPITAL_COMMUNITY)
Admission: RE | Admit: 2017-10-28 | Discharge: 2017-10-28 | Disposition: A | Discharge status: Home / Self Care | Payer: Self-pay | Source: Ambulatory Visit | Attending: Interventional Cardiology | Admitting: Interventional Cardiology

## 2017-10-28 VITALS — BP 90/62 | HR 60 | Ht 62.0 in | Wt 141.4 lb

## 2017-10-28 DIAGNOSIS — I200 Unstable angina: Secondary | ICD-10-CM | POA: Diagnosis not present

## 2017-10-28 DIAGNOSIS — E782 Mixed hyperlipidemia: Secondary | ICD-10-CM

## 2017-10-28 DIAGNOSIS — I10 Essential (primary) hypertension: Secondary | ICD-10-CM

## 2017-10-28 DIAGNOSIS — I5032 Chronic diastolic (congestive) heart failure: Secondary | ICD-10-CM

## 2017-10-28 DIAGNOSIS — Z955 Presence of coronary angioplasty implant and graft: Secondary | ICD-10-CM | POA: Insufficient documentation

## 2017-10-28 DIAGNOSIS — I2510 Atherosclerotic heart disease of native coronary artery without angina pectoris: Secondary | ICD-10-CM | POA: Diagnosis not present

## 2017-10-28 DIAGNOSIS — I214 Non-ST elevation (NSTEMI) myocardial infarction: Secondary | ICD-10-CM | POA: Insufficient documentation

## 2017-10-28 MED ORDER — ROSUVASTATIN CALCIUM 20 MG PO TABS: 20.0000 mg | ORAL_TABLET | Freq: Every day | ORAL | 3 refills | Status: DC

## 2017-10-28 NOTE — Patient Instructions (Addendum)
Medication Instructions:  Your physician has recommended you make the following change in your medication:   INCREASE: rosuvastatin (crestor) to 20 mg once daily  Labwork: Your physician recommends that you have a FASTING lipid profile and liver function panel in 3 months at the [facility]  Testing/Procedures: None ordered  Follow-Up: Your physician wants you to follow-up in: 1 year with Dr. Irish Lack. You will receive a reminder letter in the mail two months in advance. If you don't receive a letter, please call our office to schedule the follow-up appointment.   Any Other Special Instructions Will Be Listed Below (If Applicable).     If you need a refill on your cardiac medications before your next appointment, please call your pharmacy.

## 2017-10-30 ENCOUNTER — Encounter (HOSPITAL_COMMUNITY)
Admission: RE | Admit: 2017-10-30 | Discharge: 2017-10-30 | Disposition: A | Discharge status: Home / Self Care | Payer: Self-pay | Source: Ambulatory Visit | Attending: Interventional Cardiology | Admitting: Interventional Cardiology

## 2017-11-01 ENCOUNTER — Encounter (HOSPITAL_COMMUNITY): Payer: Self-pay

## 2017-11-02 ENCOUNTER — Other Ambulatory Visit: Payer: Self-pay | Admitting: Oncology

## 2017-11-03 IMAGING — US US  BREAST BX W/ LOC DEV 1ST LESION IMG BX SPEC US GUIDE*R*
1 series · 13 of 19 positions shown · non-contrast
Comparison: Previous exam(s).

CLINICAL DATA: Right breast mass and enlarged right axillary lymph
nodes.

EXAM:
ULTRASOUND GUIDED RIGHT BREAST CORE NEEDLE BIOPSY

[Series 1: us breast bx w/ loc dev 1st lesion img bx spec us  · 0.06mm/px · 13 of 19 slices shown]
[im 1/19]
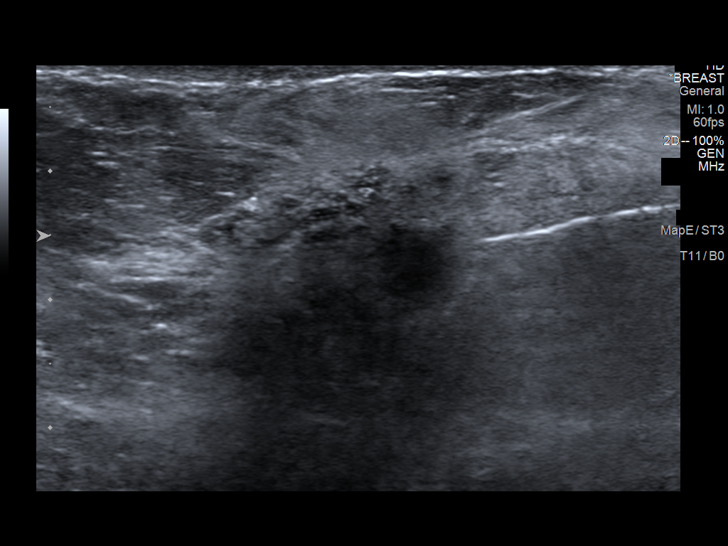
[im 3/19]
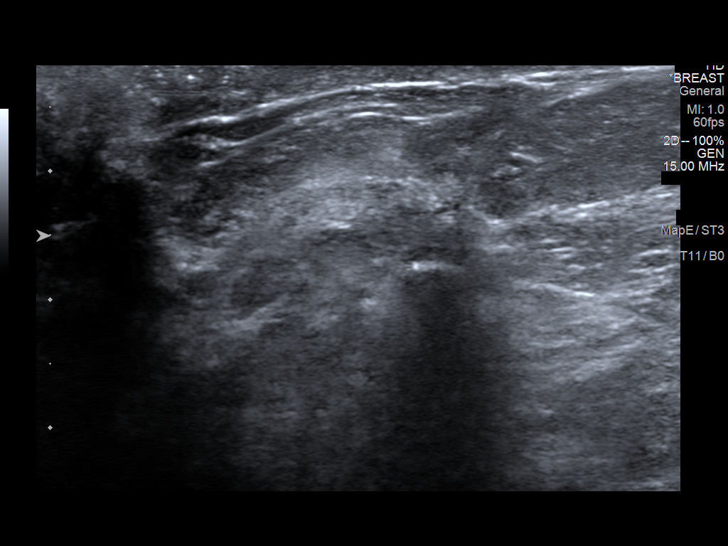
[im 4/19]
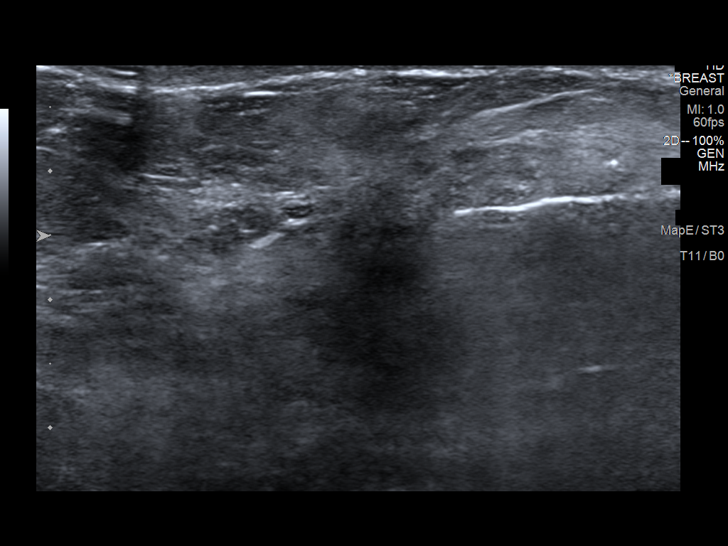
[im 6/19]
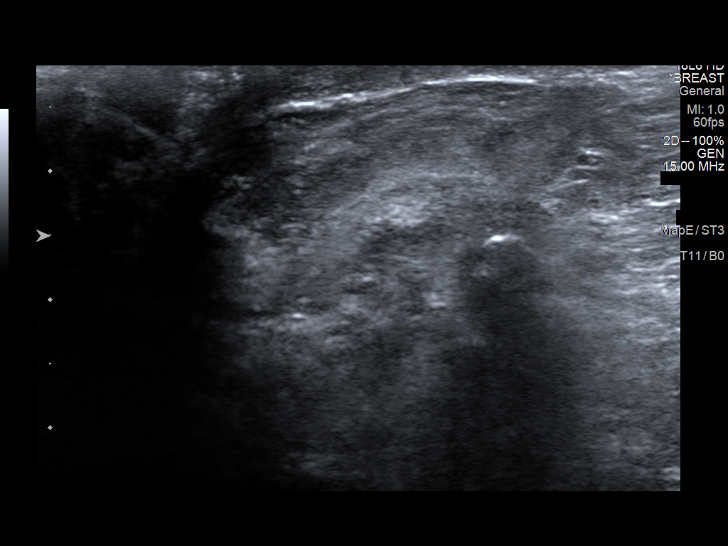
[im 7/19]
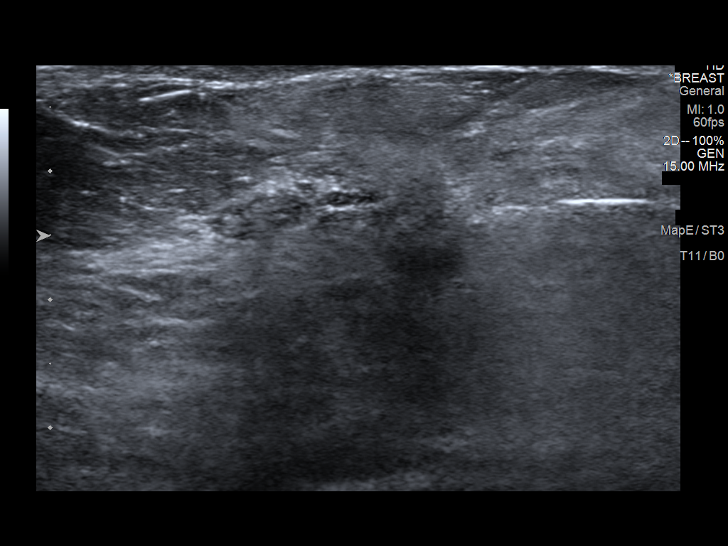
[im 9/19]
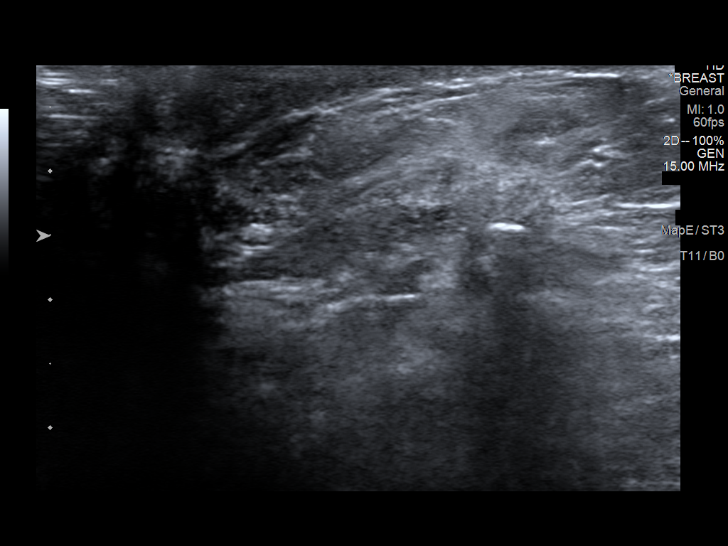
[im 10/19]
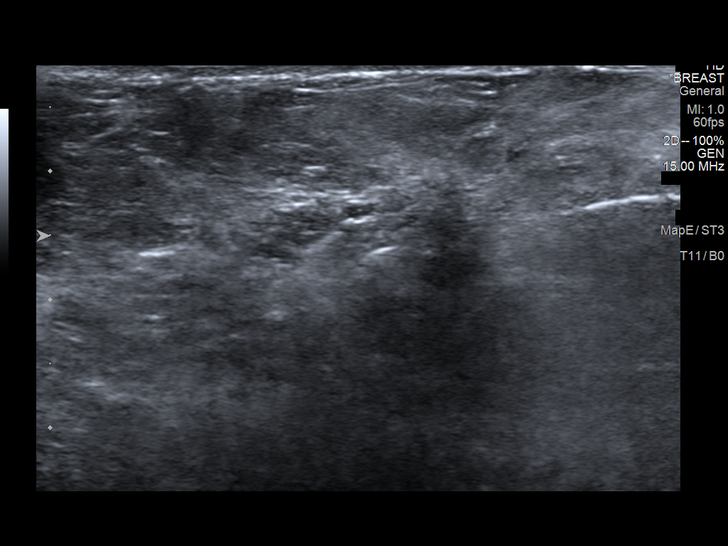
[im 11/19]
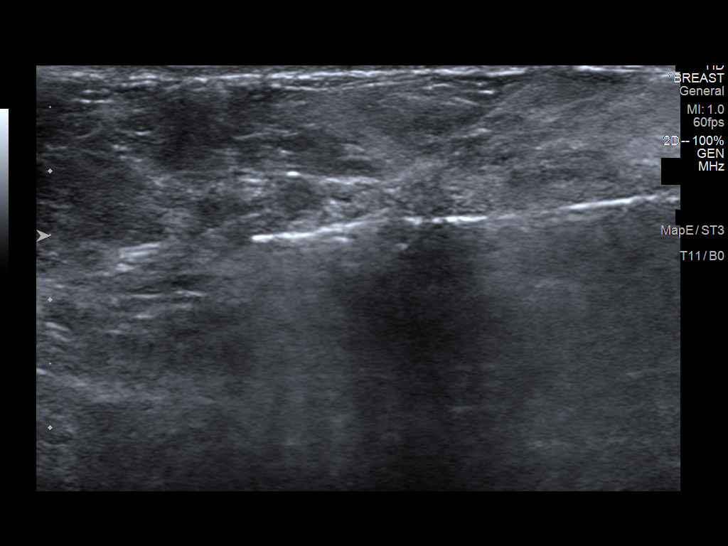
[im 13/19]
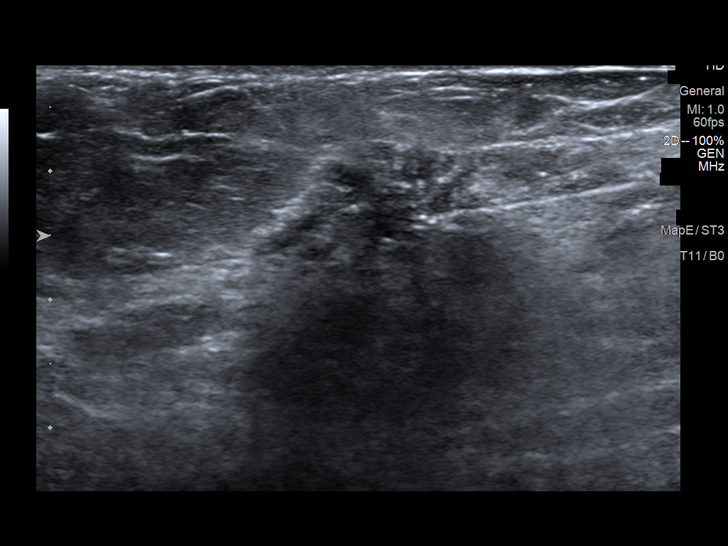
[im 14/19]
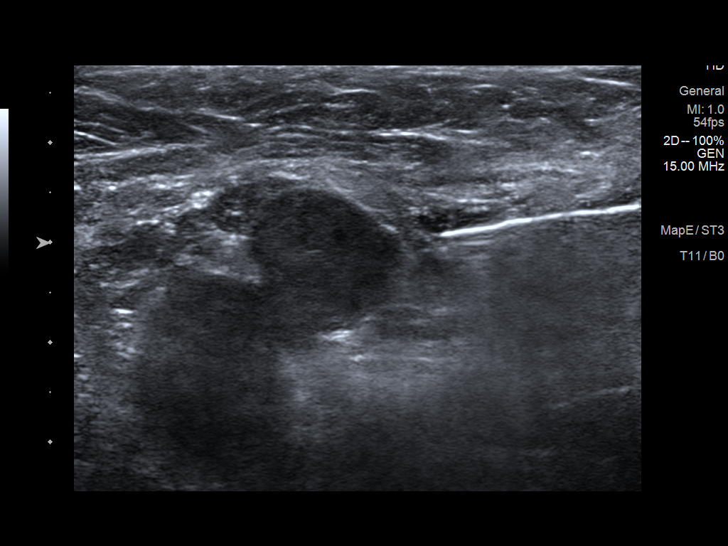
[im 16/19]
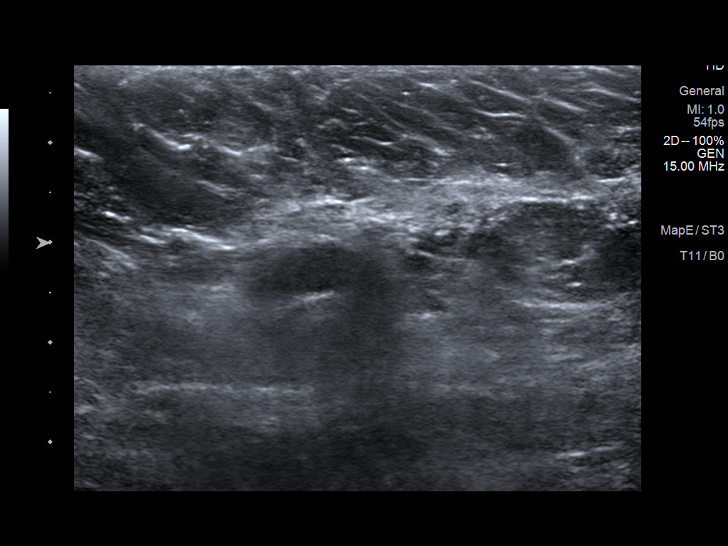
[im 17/19]
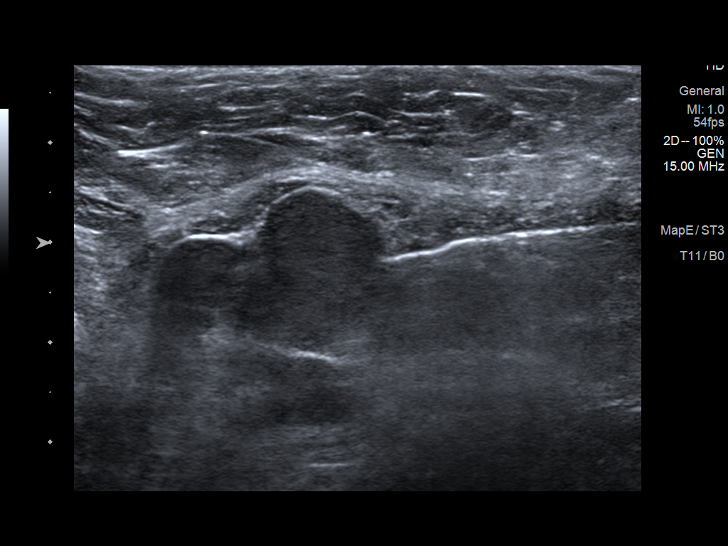
[im 19/19]
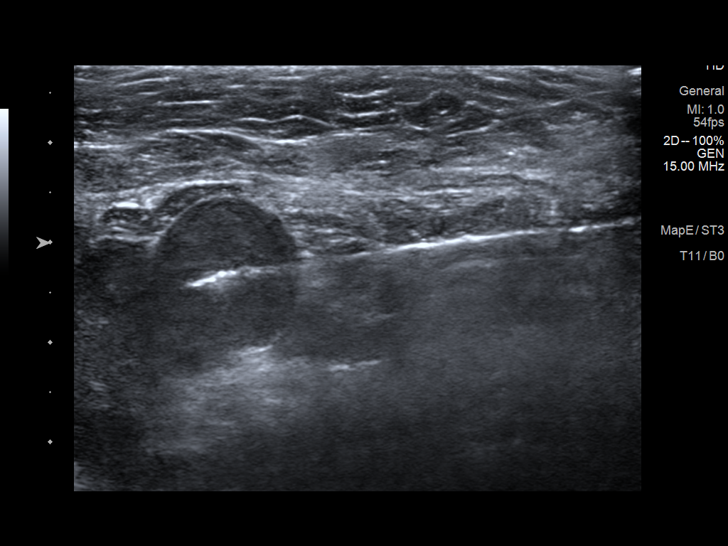

[13 of 19 positions shown; findings below may reference images not displayed]



Using sterile technique and 1% Lidocaine as local anesthetic, under
direct ultrasound visualization, a 14 gauge John-Paul device was
used to perform biopsy of the spiculated mass located within the
right breast at the 12 o'clock position using a medial approach. At
the conclusion of the procedure a ribbon shaped tissue marker clip
was deployed into the biopsy cavity. Follow up 2 view mammogram was
performed and dictated separately.
IMPRESSION: Ultrasound guided biopsy of the right breast mass located the 12
o'clock position as discussed above. No apparent complications.

## 2017-11-03 NOTE — Progress Notes (Signed)
[name]  [phone] [phone]     ID: [id number] DORFF DOB: 06-07-1945  MR#: [id number]  OFB#:[id number]  Patient Care Team: Seward Carol, MD as PCP - General (Internal Medicine) Larence Thone, Virgie Dad, MD as Consulting Physician (Oncology) Fanny Skates, MD as Consulting Physician (General Surgery) Luberta Robertson, MD (Radiology) Hennie Duos, MD as Consulting Physician (Rheumatology) Domingo Pulse, MD (Urology) Druscilla Brownie, MD as Consulting Physician (Dermatology) Luberta Mutter, MD as Consulting Physician (Ophthalmology) Magnus Sinning, MD as Consulting Physician (Physical Medicine and Rehabilitation) Benson Norway, RN as Registered Nurse (Oncology) Megan Salon, MD as Consulting Physician (Gynecology) Loney Loh, MD (Dermatology) OTHER MD:  CHIEF COMPLAINT: Estrogen receptor positive breast cancer  CURRENT TREATMENT:  Anastrozole  INTERVAL HISTORY: [name] returns today for follow-up and treatment of her estrogen receptor positive breast cancer.   The patient continues on anastrozole, which she tolerates well.   REVIEW OF SYSTEMS: [name] is doing well overall. She saw her cardiologist yesterday and was told everything looked well and that she didn't need to come back until next year. She continues to attend the cardiac exercise rehabilitation classes under [facility] which is about $61 per month. She enjoys this very much. She endorses occasional nausea that is manageable with compazine. Adding this started after starting chemotherapy. The patient's PCP reports her thyroid has become suppressed. She has lost weight and would like to lose some more weight because "she wants to be able to eat cake. " Also she enjoys how she feels and enjoys shopping for clothes. The patient denies unusual headaches, visual changes, vomiting, or dizziness. There has been no unusual cough, phlegm production, or pleurisy. This been no change in  bowel or bladder habits. The patient denies unexplained fatigue or unexplained weight loss, bleeding, rash, or fever. A detailed review of systems was otherwise noncontributory.      BREAST CANCER HISTORY: From the original intake note:  [name] had routine mammographic screening in 08/18/2015 at the [facility], showing a right breast mass and possible axillary adenopathy. On 08/23/2015 she underwent bilateral diagnostic mammography with tomography and bilateral breast ultrasonography. The breast density was category B in the right breast there was a spiculated mass in the upper-outer quadrant measuring 1.2 cm and associated with pleomorphic calcifications. There were calcifications extending anteriorly and posteriorly from the mass worrisome for ductal carcinoma in situ. The total area in question is about 3 cm. There were also multiple enlarged right axillary lymph nodes. The mass was palpated as an area of fullness at the 12:00 position of the breast 3 cm from the nipple. There were palpable enlarged right axillary lymph nodes in the right axilla. Ultrasonography of the right breast confirmed an irregular hypoechoic mass in the area in question measuring 1.6 cm. There were multiple abnormal appearing enlarged right axillary lymph nodes, the largest measuring 2.3 cm.  In the left breast, mammography showed an area of architectural distortion associated with the left nipple. This was not palpable. Ultrasonography of the left breast found no correlate. This area of the left breast was biopsied under tomography 08/24/2015. This showed (SAA [id number]) a complex sclerosing lesion and fibroadenoma.  On 08/23/2015 the patient underwent biopsy of the right breast mass in 1 of the abnormal right axillary lymph nodes. Both were positive for invasive ductal carcinoma, grade 2 both estrogen receptor 100% positive and both progesterone receptor 5% positive with MIB-1 of 20-30% (the higher 1 obtained from the lymph  node). Both biopsies  were HER-2 nonamplified, the breast being ratio 1.27 and number per cell 1.90, and the lymph node 1 ratio 1.24 and number per cell 3.0.  On 08/26/2015 the patient underwent bilateral breast MRI. In the left breast there was no suspicious enhancement. The left axilla was benign. In the right breast there was a mass described as in the upper inner quadrant measuring 2.3 cm. Less than a centimeter away from this mass there was a satellite nodule measuring 0.9 cm. In the right axilla there were multiple enlarged right axillary lymph nodes, including a confluent region measuring up to 4 cm. There were also enlarged level II lymph nodes present measuring up to 2.1 cm.  The patient's subsequent history is as detailed below  PAST MEDICAL HISTORY: Past Medical History:  Diagnosis Date  . Anxiety   . Arthritis 2008  . Asthma 1953   age 53  . Breast cancer ([place])   . Cancer [facility])    breast cancer  . Chronic vaginitis 1998  . Fibromyalgia 1982  . GERD (gastroesophageal reflux disease)    since at least 2011  . Hemorrhoids 2009  . History of radiation therapy 01/18/16-03/05/16   right breast/nodal areas 45 Gy, right breast boost 16 Gy  . Hypoglycemia 2003  . Hypothyroidism 1964   age 22  . Interstitial cystitis 1998  . Irritable bowel syndrome 06/14/2009   Qualifier: Diagnosis of  By: Patsy Baltimore RN, Langley Gauss    . Menopausal symptoms   . Osteoporosis 12/24/2006  . Personal history of chemotherapy   . Personal history of radiation therapy   . Pneumonia 1979   varicella  . PONV (postoperative nausea and vomiting) 1979    PAST SURGICAL HISTORY: Past Surgical History:  Procedure Laterality Date  . BLADDER HYPEREXTENSION     X4, prior to 01/2006  . BLADDER SUSPENSION    . BREAST BIOPSY    . BREAST LUMPECTOMY Right    2017  . BREAST LUMPECTOMY WITH NEEDLE LOCALIZATION AND AXILLARY LYMPH NODE DISSECTION Right 12/05/2015   Procedure: RIGHT BREAST NEEDLE LOCALIZED (WIRES X 2)  LUMPECTOMY WITH AXILLARY LYMPH NODE DISSECTION;  Surgeon: Fanny Skates, MD;  Location: [place];  Service: General;  Laterality: Right;  . BREAST LUMPECTOMY WITH RADIOACTIVE SEED LOCALIZATION Left 12/05/2015   Procedure: RADIOACTIVE SEED GUIDED LEFT BREAST LUMPECTOMY;  Surgeon: Fanny Skates, MD;  Location: [place];  Service: General;  Laterality: Left;  . BREAST SURGERY    . BUNIONECTOMY     prior to 01/2006  . CARDIAC CATHETERIZATION    . CESAREAN SECTION  1979  . CHOLECYSTECTOMY  02/08/2006   DR. INGRAM  . COLONOSCOPY    . CORONARY STENT INTERVENTION N/A 01/01/2017   Procedure: CORONARY STENT INTERVENTION;  Surgeon: Jettie Booze, MD;  Location: [place] [facility];  Service: Cardiovascular;  Laterality: N/A;  . DENTAL SURGERY  2009-2016   13 dental implants  . LEFT HEART CATH AND CORONARY ANGIOGRAPHY N/A 01/01/2017   Procedure: LEFT HEART CATH AND CORONARY ANGIOGRAPHY;  Surgeon: Jettie Booze, MD;  Location: [place] [facility];  Service: Cardiovascular;  Laterality: N/A;  . LEG SURGERY  07/06/2004   Broken tibia  . NASAL SINUS SURGERY     X4; prior to 01/2006  . PORT-A-CATH REMOVAL Right 12/05/2015   Procedure: REMOVAL PORT-A-CATH;  Surgeon: Fanny Skates, MD;  Location: [place];  Service: General;  Laterality: Right;  . PORTACATH PLACEMENT Left 09/08/2015   Procedure: INSERTION OF PORT-A-CATH ;  Surgeon: Fanny Skates, MD;  Location: [facility];  Service: General;  Laterality: Left;    FAMILY HISTORY Family History  Problem Relation Age of Onset  . Hypertension Mother   . Heart disease Mother        d. 80  . Parkinsonism Father        d. 71  . Heart disease Sister   . Breast cancer Sister 25       s/p mastectomy; reportedly negative BRCA1/2 testing approx 7 years ago  . Melanoma Sister        d. 60s; hx of incresed sun exposure  . Cancer Sister        dx. sarcoma of her face in her 25s; treated w/ MOHS surgery  . Parkinsonism Paternal Aunt        d. 90s  . Parkinsonism  Paternal Uncle        d. late 41s  . Kidney failure Maternal Grandmother 5  . Heart attack Maternal Grandfather        d. 27s  . Heart disease Paternal Grandmother        d. 66  . Breast cancer Other 35       maternal great aunt (MGF's sister)  . Kidney cancer Paternal Uncle 51       d. 25s; unsure about smoking status  The patient's father died at age 110 from complications of Parkinson's disease. The patient's mother died age 27 with heart disease. [name] had no brothers. She had 2 sisters. One sister was diagnosed with breast cancer at age 89. She was tested for the BRCA gene and was negative. The same sister also had melanoma diagnosed in her 14s. The patient has one maternal great aunt diagnosed with breast cancer around age 80. There is no history of ovarian cancer in the family  GYNECOLOGIC HISTORY:  Patient's last menstrual period was 04/30/1996 (approximate). Menarche age 87, first live birth age 63. The patient is GX P2. She stopped having periods in her early 73s and took hormone replacement until her breast cancer diagnosis, April 2017.  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 He has been in remission for more than 10 years. Their daughter [name] lives in [place] were she is Garment/textile technologist of the [employer]. Daughter [name] is an infectious disease M.D. working and teaching at [employer] in [place]. The patient has 2 grandchildren. The family attends [place]: Not in place   HEALTH MAINTENANCE: Social History   Tobacco Use  . Smoking status: Former Smoker    Packs/day: 0.25    Years: 15.00    Pack years: 3.75    Types: Cigarettes  . Smokeless tobacco: Never Used  . Tobacco comment: social smoker/"on and off"  Substance Use Topics   . Alcohol use: Yes    Alcohol/week: 0.0 oz    Comment: social  . Drug use: No     Colonoscopy: 2016/ [facility]  PAP: 2015  Bone density: October 2018, T-score: -2.7  Lipid panel:  Allergies  Allergen Reactions  . Chlorhexidine Other (See Comments)    [place] IS OK. The CLOTH WIPE causes reaction. Causes itching all over the area  . Other Rash    Adhesive tape    Current Outpatient Medications  Medication Sig Dispense Refill  . albuterol (  PROVENTIL HFA;VENTOLIN HFA) 108 (90 Base) MCG/ACT inhaler Inhale 1-2 puffs into the lungs every 6 (six) hours as needed for wheezing or shortness of breath. Started in 1998    . anastrozole (ARIMIDEX) 1 MG tablet Take 1 tablet (1 mg total) by mouth daily. 90 tablet 4  . aspirin EC 81 MG EC tablet Take 1 tablet (81 mg total) by mouth daily. 30 tablet 0  . BRILINTA 90 MG TABS tablet TAKE 1 TABLET(90 MG) BY MOUTH TWICE DAILY 60 tablet 9  . buPROPion (WELLBUTRIN XL) 300 MG 24 hr tablet Take 300 mg by mouth daily.     . Calcium Carbonate-Vitamin D (CALCIUM + D PO) Take 1 tablet by mouth at bedtime. Started approximately 1998    . carisoprodol (SOMA) 350 MG tablet Take 350 mg by mouth 2 (two) times daily as needed for muscle spasms. Started in 2008.  2  . diazepam (VALIUM) 5 MG tablet Take 5 mg by mouth every 6 (six) hours as needed for muscle spasms (interstitial cystitis). Started approximately 2008.    . furosemide (LASIX) 20 MG tablet Take 1 tablet (20 mg total) by mouth daily as needed (shortness of breath). 30 tablet 3  . HYDROcodone-acetaminophen (NORCO) 10-325 MG tablet Take 1 tablet by mouth every 4 (four) hours as needed for moderate pain or severe pain (as needed for fibromyalgia and IC).     . hydrOXYzine (ATARAX/VISTARIL) 25 MG tablet Take 1 tablet by mouth at bedtime.    Marland Kitchen levothyroxine (SYNTHROID, LEVOTHROID) 75 MCG tablet Take 75 mcg by mouth daily.  2  . LORazepam (ATIVAN) 2 MG tablet Take 1 tablet by mouth daily as needed for anxiety.   5  .  metoprolol succinate (TOPROL-XL) 25 MG 24 hr tablet TAKE 1/2 TABLET(12.5 MG) BY MOUTH DAILY 45 tablet 3  . minoxidil (ROGAINE) 2 % external solution Apply topically 2 (two) times daily.    . nitroGLYCERIN (NITROSTAT) 0.4 MG SL tablet Place 1 tablet (0.4 mg total) under the tongue every 5 (five) minutes as needed for chest pain. 25 tablet 3  . NONFORMULARY OR COMPOUNDED ITEM Vit E vaginal suppositories 268m/ml.  One pv three times weekly.  #36/4RF 36 each 4  . ondansetron (ZOFRAN) 4 MG tablet Take 1 tablet by mouth daily as needed for nausea or vomiting.   3  . pantoprazole (PROTONIX) 40 MG tablet Take 40 mg by mouth 2 (two) times daily. Started in 2012.    . potassium chloride (K-DUR) 10 MEQ tablet TAKE 1 TABLET BY MOUTH AS NEEDED. TAKE WITH LASIX 30 tablet 11  . prochlorperazine (COMPAZINE) 10 MG tablet Take 1 tablet (10 mg total) by mouth every 6 (six) hours as needed (Nausea or vomiting). 30 tablet 1  . rosuvastatin (CRESTOR) 20 MG tablet Take 1 tablet (20 mg total) by mouth daily. 90 tablet 3  . terconazole (TERAZOL 7) 0.4 % vaginal cream Place 1 applicator vaginally 2 (two) times daily as needed (irritation).     . Vaginal Lubricant (REPLENS VA) Place vaginally daily.    . Vitamin D, Ergocalciferol, (DRISDOL) 50000 units CAPS capsule TAKE 1 CAPSULE BY MOUTH EVERY 7 DAYS 12 capsule 4  . zolpidem (AMBIEN) 10 MG tablet Take 10 mg by mouth at bedtime as needed for sleep.      No current facility-administered medications for this visit.      OBJECTIVE: Middle-aged white woman in no acute distress  Vitals:   11/04/17 1455  BP: 134/67  Pulse: 62  Resp: 18  Temp: 98.4 F (36.9 C)  SpO2: 97%     Body mass index is 25.33 kg/m.    ECOG FS:1 - Symptomatic but completely ambulatory  Sclerae unicteric, EOMs intact Oropharynx clear and moist No cervical or supraclavicular adenopathy Lungs no rales or rhonchi Heart regular rate and rhythm Abd soft, nontender, positive bowel sounds MSK no  focal spinal tenderness, no upper extremity lymphedema Neuro: nonfocal, well oriented, appropriate affect Breasts: The right breast is status post lumpectomy and radiation.  There are posttreatment changes but no erythema, masses, or areas suspicious for recurrence.  The left breast is benign.  Both axilla are benign.   Right breast 08/15/2016    Right Breast 05/29/2017      LAB RESULTS:  CMP     Component Value Date/Time   NA 140 08/21/2017 1415   NA 138 03/11/2017 1524   K 4.1 08/21/2017 1415   K 3.8 03/11/2017 1524   CL 104 08/21/2017 1415   CO2 28 08/21/2017 1415   CO2 25 03/11/2017 1524   GLUCOSE 124 08/21/2017 1415   GLUCOSE 136 03/11/2017 1524   BUN 11 08/21/2017 1415   BUN 13.2 03/11/2017 1524   CREATININE 0.89 08/21/2017 1415   CREATININE 0.8 03/11/2017 1524   CALCIUM 9.3 08/21/2017 1415   CALCIUM 9.6 03/11/2017 1524   PROT 7.0 08/21/2017 1415   PROT 7.6 03/11/2017 1524   ALBUMIN 3.8 08/21/2017 1415   ALBUMIN 3.8 03/11/2017 1524   AST 22 08/21/2017 1415   AST 24 03/11/2017 1524   ALT 30 08/21/2017 1415   ALT 38 03/11/2017 1524   ALKPHOS 102 08/21/2017 1415   ALKPHOS 166 (H) 03/11/2017 1524   BILITOT 0.5 08/21/2017 1415   BILITOT 0.44 03/11/2017 1524   GFRNONAA >60 08/21/2017 1415   GFRAA >60 08/21/2017 1415    INo results found for: SPEP, UPEP  Lab Results  Component Value Date   WBC 8.4 11/04/2017   NEUTROABS 5.9 11/04/2017   HGB 13.7 11/04/2017   HCT 40.1 11/04/2017   MCV 95.5 11/04/2017   PLT 235 11/04/2017      Chemistry      Component Value Date/Time   NA 140 08/21/2017 1415   NA 138 03/11/2017 1524   K 4.1 08/21/2017 1415   K 3.8 03/11/2017 1524   CL 104 08/21/2017 1415   CO2 28 08/21/2017 1415   CO2 25 03/11/2017 1524   BUN 11 08/21/2017 1415   BUN 13.2 03/11/2017 1524   CREATININE 0.89 08/21/2017 1415   CREATININE 0.8 03/11/2017 1524      Component Value Date/Time   CALCIUM 9.3 08/21/2017 1415   CALCIUM 9.6 03/11/2017  1524   ALKPHOS 102 08/21/2017 1415   ALKPHOS 166 (H) 03/11/2017 1524   AST 22 08/21/2017 1415   AST 24 03/11/2017 1524   ALT 30 08/21/2017 1415   ALT 38 03/11/2017 1524   BILITOT 0.5 08/21/2017 1415   BILITOT 0.44 03/11/2017 1524       No results found for: LABCA2  No components found for: LABCA125  No results for input(s): INR in the last 168 hours.  Urinalysis    Component Value Date/Time   COLORURINE YELLOW 01/03/2017 1205   APPEARANCEUR HAZY (A) 01/03/2017 1205   LABSPEC 1.006 01/03/2017 1205   PHURINE 7.0 01/03/2017 1205   GLUCOSEU NEGATIVE 01/03/2017 1205   HGBUR SMALL (A) 01/03/2017 1205   BILIRUBINUR NEGATIVE 01/03/2017 1205   BILIRUBINUR n 10/01/2016 1316   KETONESUR NEGATIVE  01/03/2017 Coin 01/03/2017 1205   UROBILINOGEN 0.2 10/01/2016 1316   NITRITE NEGATIVE 01/03/2017 1205   LEUKOCYTESUR LARGE (A) 01/03/2017 1205      ELIGIBLE FOR AVAILABLE RESEARCH PROTOCOL: PALLAS, neuropathy study  STUDIES: No results found.    ASSESSMENT: 72 y.o. [name] woman status post right breast upper outer quadrant and right axillary lymph node biopsy 08/23/2015, both positive for a clinical T1 N2, stage IIIA invasive ductal carcinoma, grade 2, strongly estrogen receptor positive, 5% progesterone receptor positive, with an MIB-120-30% and no HER-2 amplification  (a) biopsy of a 0.9 cm Right breast satellite nodule 09/13/2015 showed invasive ductal carcinoma, grade 2, E-cadherin strongly positive, with a prognostic panel identical to the larger right upper quadrant mass  (1) left breast biopsy 08/24/2015 shows a complex sclerosing lesion, excised 12/05/2015  (2) neoadjuvant chemotherapy consisting of cyclophosphamide and docetaxel every 21 days 4, with OnPro support, started 09/12/2015, completed 11/14/2015  (3) status post right lumpectomy and sentinel lymph node dissection 12/05/2015 for a residual pT2 pN3, stage IIIC invasive ductal carcinoma,  grade 2, with a close anterior margin (skin)  (4) adjuvant radiation 01/18/16 - 11/6/17with capecitabine chemo-sensitization   1) Right breast/nodal areas: 45 Gy in 25 fractions                         2) Right breast boost: 16 Gy in 8 fractions  (5) capecitabine started 04/13/2016, discontinued January 2018 with mild/moderate [name] effects developing  (6) anastrozole started 06/01/2016  (a) bone density January 30, 2017 shows a T score of -2.7 (osteoporosis)  (7) genetics testing ([place]kenazi descent) 02/07/2016 through the Breast/Ovarian Cancer Panel offered by [facility] Hope Pigeon, MD) i found no deleterious mutations in  ATM, BARD1, BRCA1, BRCA2, BRIP1, CDH1, CHEK2, FANCC, MLH1, MSH2, MSH6, NBN, PALB2, PMS2, PTEN, RAD51C, RAD51D, TP53, and XRCC2.  This panel also includes deletion/duplication analysis (without sequencing) for one gene, EPCAM.  (8) consented to PALLAS trial   (a) randomized to anastrozole alone I have reviewed the above documentation for accuracy and completeness, and I agree with the above.  PLAN: Araina is now just about 2 years out from definitive surgery for breast cancer with no evidence of disease recurrence.  This is favorable.  She is tolerating anastrozole well and the plan will be to continue that a minimum of 5 years.  She had a chest CT scan last year which showed some areas likely benign but felt to require follow-up so she will have a repeat CT of the chest before she returns to see me in October.  She will be due for mammography at the same time.  She is very upset at the fact that her hair has not recovered.  She has spent a lot of money on wigs.  Has used Rogaine to no avail so far.  This is really not satisfactory.  She is getting a lot out of the exercise program through the cardiac rehab.  She will have a fasting lipid panel with the lab work in October as requested by her cardiologist  She knows to call for any other issues that may  develop before the next visit.  Evani Shrider, Virgie Dad, MD  11/04/17 3:13 PM Medical Oncology and Hematology [facility] [address] [place], [place] [address] Tel. [phone]    Fax. [phone]  Carmela Hurt am acting as a scribe for Chauncey Cruel, MD.   I, Lurline Del MD,  have reviewed the above documentation for accuracy and completeness, and I agree with the above.

## 2017-11-04 ENCOUNTER — Encounter (HOSPITAL_COMMUNITY)
Admission: RE | Admit: 2017-11-04 | Discharge: 2017-11-04 | Disposition: A | Discharge status: Home / Self Care | Payer: Self-pay | Source: Ambulatory Visit | Attending: Interventional Cardiology | Admitting: Interventional Cardiology

## 2017-11-04 ENCOUNTER — Inpatient Hospital Stay: Payer: Medicare Other | Attending: Oncology

## 2017-11-04 ENCOUNTER — Ambulatory Visit: Payer: Medicare Other | Admitting: Oncology

## 2017-11-04 ENCOUNTER — Inpatient Hospital Stay: Payer: Medicare Other | Admitting: *Deleted

## 2017-11-04 ENCOUNTER — Telehealth: Payer: Self-pay | Admitting: Oncology

## 2017-11-04 ENCOUNTER — Inpatient Hospital Stay (HOSPITAL_BASED_OUTPATIENT_CLINIC_OR_DEPARTMENT_OTHER): Payer: Medicare Other | Admitting: Oncology

## 2017-11-04 VITALS — BP 134/67 | HR 62 | Temp 98.4°F | Resp 18 | Ht 62.0 in | Wt 138.5 lb

## 2017-11-04 DIAGNOSIS — Z006 Encounter for examination for normal comparison and control in clinical research program: Secondary | ICD-10-CM | POA: Diagnosis not present

## 2017-11-04 DIAGNOSIS — Z79811 Long term (current) use of aromatase inhibitors: Secondary | ICD-10-CM | POA: Insufficient documentation

## 2017-11-04 DIAGNOSIS — Z170 Estrogen receptor positive status [ER+]: Secondary | ICD-10-CM

## 2017-11-04 DIAGNOSIS — R9389 Abnormal findings on diagnostic imaging of other specified body structures: Secondary | ICD-10-CM | POA: Insufficient documentation

## 2017-11-04 DIAGNOSIS — L658 Other specified nonscarring hair loss: Secondary | ICD-10-CM | POA: Diagnosis not present

## 2017-11-04 DIAGNOSIS — Z923 Personal history of irradiation: Secondary | ICD-10-CM | POA: Diagnosis not present

## 2017-11-04 DIAGNOSIS — Z9221 Personal history of antineoplastic chemotherapy: Secondary | ICD-10-CM | POA: Insufficient documentation

## 2017-11-04 DIAGNOSIS — Z955 Presence of coronary angioplasty implant and graft: Secondary | ICD-10-CM

## 2017-11-04 DIAGNOSIS — C773 Secondary and unspecified malignant neoplasm of axilla and upper limb lymph nodes: Secondary | ICD-10-CM | POA: Diagnosis not present

## 2017-11-04 DIAGNOSIS — C50411 Malignant neoplasm of upper-outer quadrant of right female breast: Secondary | ICD-10-CM | POA: Diagnosis not present

## 2017-11-04 LAB — CBC WITH DIFFERENTIAL (CANCER CENTER ONLY)
Basophils Absolute: 0 10*3/uL (ref 0.0–0.1)
Basophils Relative: 0 %
EOS PCT: 2 %
Eosinophils Absolute: 0.1 10*3/uL (ref 0.0–0.5)
HCT: 40.1 % (ref 34.8–46.6)
Hemoglobin: 13.7 g/dL (ref 11.6–15.9)
LYMPHS ABS: 1.9 10*3/uL (ref 0.9–3.3)
Lymphocytes Relative: 23 %
MCH: 32.6 pg (ref 25.1–34.0)
MCHC: 34.2 g/dL (ref 31.5–36.0)
MCV: 95.5 fL (ref 79.5–101.0)
Monocytes Absolute: 0.5 10*3/uL (ref 0.1–0.9)
Monocytes Relative: 6 %
NEUTROS ABS: 5.9 10*3/uL (ref 1.5–6.5)
Neutrophils Relative %: 69 %
PLATELETS: 235 10*3/uL (ref 145–400)
RBC: 4.2 MIL/uL (ref 3.70–5.45)
RDW: 13.7 % (ref 11.2–14.5)
WBC Count: 8.4 10*3/uL (ref 3.9–10.3)

## 2017-11-04 LAB — CMP (CANCER CENTER ONLY)
ALT: 26 U/L (ref 0–44)
ANION GAP: 7 (ref 5–15)
AST: 22 U/L (ref 15–41)
Albumin: 4.1 g/dL (ref 3.5–5.0)
Alkaline Phosphatase: 91 U/L (ref 38–126)
BUN: 15 mg/dL (ref 8–23)
CHLORIDE: 106 mmol/L (ref 98–111)
CO2: 27 mmol/L (ref 22–32)
Calcium: 9.5 mg/dL (ref 8.9–10.3)
Creatinine: 0.93 mg/dL (ref 0.44–1.00)
GFR, Est AFR Am: >60 mL/min (ref >60)
GFR, Estimated: 60 mL/min — ABNORMAL LOW (ref >60)
Glucose, Bld: 123 mg/dL — ABNORMAL HIGH (ref 70–99)
Potassium: 3.7 mmol/L (ref 3.5–5.1)
SODIUM: 140 mmol/L (ref 135–145)
Total Bilirubin: 0.7 mg/dL (ref 0.3–1.2)
Total Protein: 7.2 g/dL (ref 6.5–8.1)

## 2017-11-04 LAB — HEMOGLOBIN A1C
Hgb A1c MFr Bld: 5.4 % (ref 4.8–5.6)
Mean Plasma Glucose: 108.28 mg/dL

## 2017-11-04 IMAGING — MG MM BREAST BX W LOC DEV 1ST LESION IMAGE BX SPEC STEREO GUIDE*L*
4 series · 4 of 16 positions shown · non-contrast
Comparison: Previous exams.

CLINICAL DATA: Recently diagnosed right breast invasive ductal
carcinoma with DCIS and metastatic right axillary lymph node. Area
of subtle possible distortion located within the left breast
anteriorly at the 1 o'clock position. Biopsy requested.

EXAM:
LEFT BREAST TOMOSYNTHESIS GUIDED CORE NEEDLE BIOPSY

[L CC]
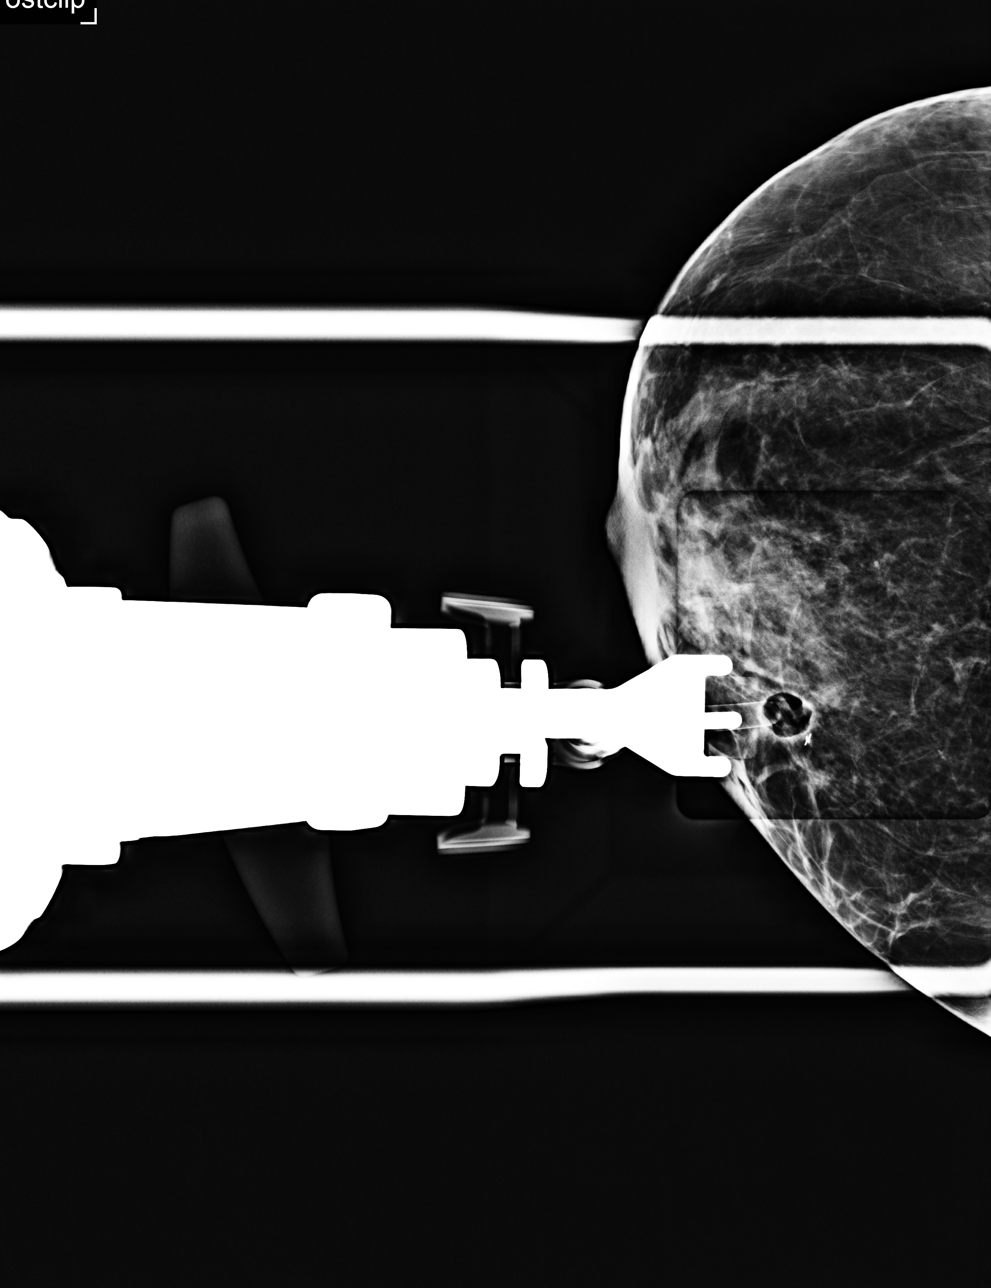

[L CC tomo (1 of 3) · tomo slice 22/43.0]
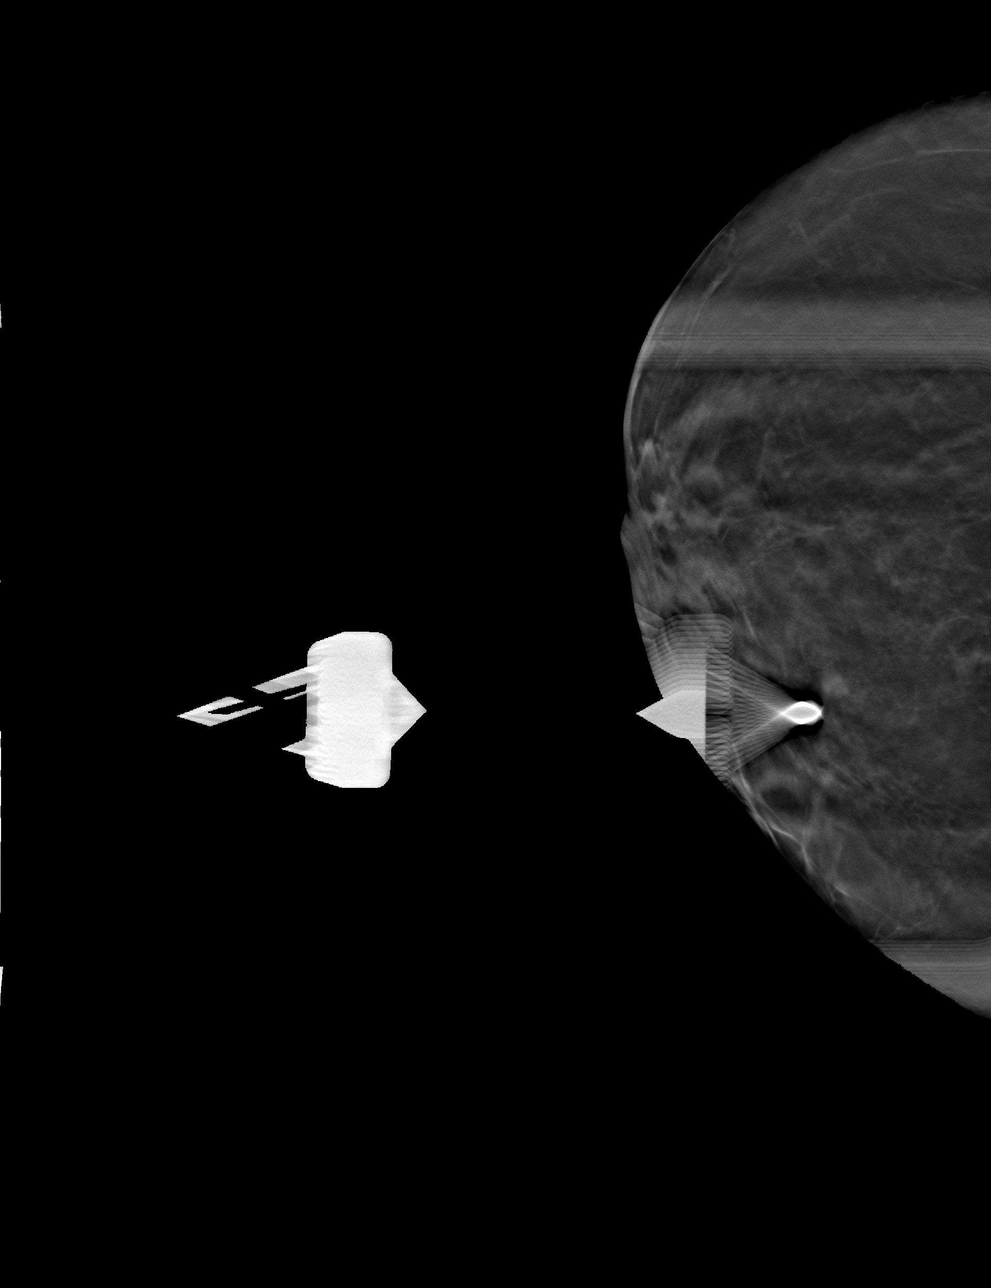

[L CC tomo (2 of 3) · tomo slice 22/43.0]
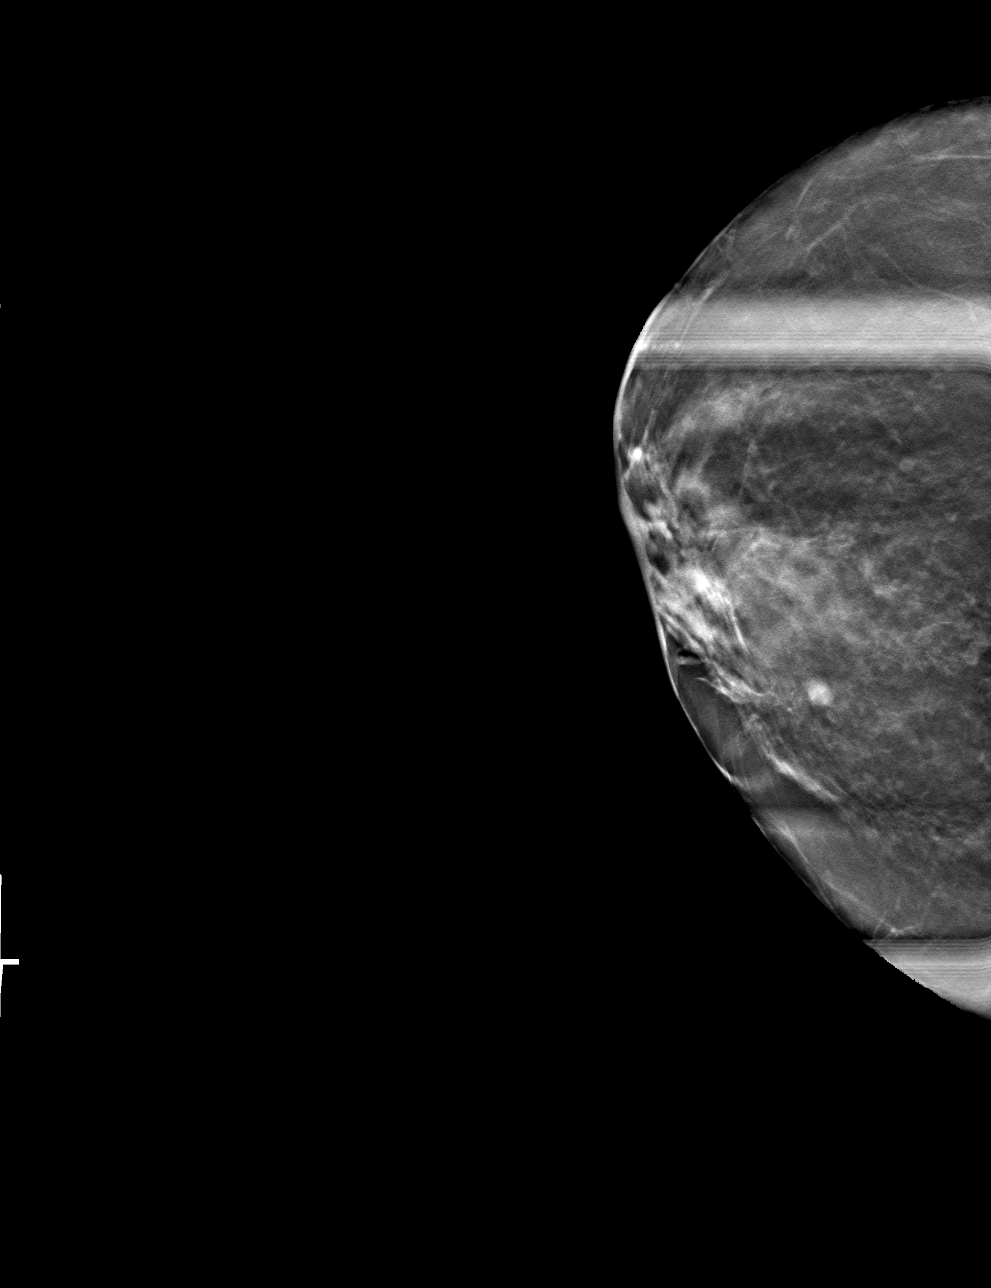

[L CC tomo (3 of 3) · tomo slice 22/43.0]
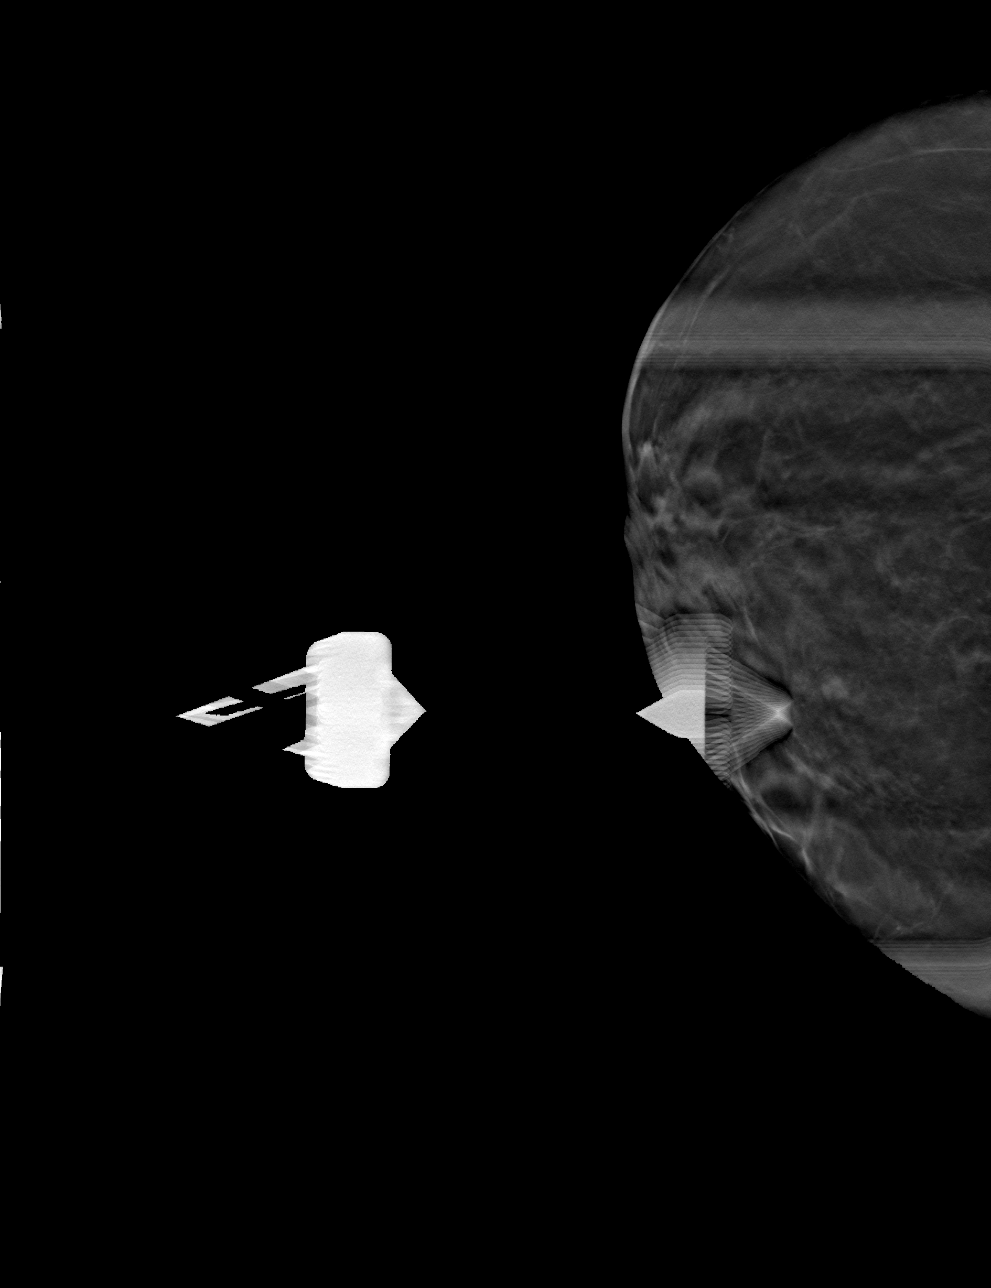

[4 of 16 positions shown; findings below may reference images not displayed]



Using sterile technique and 1% Lidocaine as local anesthetic, under
stereotactic guidance, a 9 gauge vacuum assisted device was used to
perform core needle biopsy of the area of subtle distortion located
within the anterior portion of the left breast at the 1 o'clock
position using a superior craniocaudal approach. No specimen
mammogram was obtained.

At the conclusion of the procedure, an X shaped tissue marker clip
was deployed into the biopsy cavity. Follow-up 2-view mammogram was
performed and dictated separately.
IMPRESSION: Tomosynthesis guided biopsy of subtle possible left breast
distortion. No apparent complications.

## 2017-11-04 NOTE — Progress Notes (Signed)
11/04/2017 Patient in to clinic today for Cycle 18 visit assessments; due to patient and MD scheduling conflicts, visit is occurring two days prior to start of cycle 18, as allowed per protocol visit windows. Upon arrival to clinic, patient was given her questionnaires for PROs and adherence, which were completed by her independently. Patient returned her completed drug diaries for Cycles 15 and 16 for anti-hormone therapy, indicating no missed doses. Patient also brought partially completed Cycle 17 diary, and was given a postage-paid, pre-addressed envelope to mail the diary in after it is completed this week. Patient was also given drug diaries to complete for Cycles 18-20. She reports no changes in ongoing previously reported AEs. She continues to have intermittent nausea, same as baseline, with occasional use of Compazine. She reports ongoing complaints of double vision, which her eye doctor has treated with prism glasses, though she doesn't note much benefit. She had a brief bout with sinusitis in April. Based on lab results review and H&P by Dr. Jana Hakim, patient condition is acceptable for continued treatment on study. Plans made for return visit in October 2019. Patient reports the following upcoming trips: - Family reunion in [place] on 12/05/2017, the first for her family - Trip to [place] with departure on 02/24/2018 and return on November 12th or 13th. Patient states that Dr. Irish Lack increased her dose of Crestor to 13m daily, and plans to switch from Brilinta to Plavix in [place] It is felt that this may cause less bruising. Dr. PDelfina Redwoodcontinues to adjust her Synthroid dose. Cindy S. SBrigitte PulseBSN, RN, CWarm Springs7/11/2017 4:17 PM  Adverse Event Log Protocol: AFT-05 PALLAS Cycles 15-17: 08/21/2017 - 11/04/2017 (cycle 17 end date = 11/05/2017) Baseline AEs - improved to grade 1: neuropathy (10/12/16) and fatigue (08/15/16) Event Grade Onset Date Resolved Date Attribution to  anastrozole Treatment/ Comments  Vaginal dryness Grade 1 08/01/16 ongoing Yes   Urinary incontinence- stress  Grade 1  08/15/16  ongoing  No   Atrophic vaginitis Grade 2 09/13/16 ongoing Yes Vitamin E vaginal suppositories  Sinus bradycardia, int. Grade 1 12/31/16 ongoing No   LV diastolic dysfunction Grade 1 01/02/17 ongoing No   Hyperlipidemia Grade 2 01/02/17 ongoing Yes Statin started 01/01/17 for ACS; Zetia switched to Crestor  Chronic diastolic CHF Grade 2 91/11/73ongoing No Lasix with prophylactic K-dur  Weight loss Grade 2 03/11/17 ongoing No   Bruising Grade 1 04/01/17 ongoing No Related to Brilinta  Sinus infection Grade 2 08/23/2017 08/30/2017 No None  Elevated glucose Grade 1 11/04/2017 ongoing No   Decreased eGFR Grade 1 11/04/2017 ongoing No   Hypertension Grade 1 11/04/2017 ongoing No   Cindy S. SBrigitte PulseBSN, RN, CPoyen8/12/2017 3:44 PM

## 2017-11-04 NOTE — Telephone Encounter (Signed)
Gave pt avs and calendar with appts per 7/8 los

## 2017-11-05 ENCOUNTER — Encounter: Payer: Self-pay | Admitting: Oncology

## 2017-11-06 ENCOUNTER — Encounter (HOSPITAL_COMMUNITY): Payer: Self-pay

## 2017-11-08 ENCOUNTER — Encounter (HOSPITAL_COMMUNITY)
Admission: RE | Admit: 2017-11-08 | Discharge: 2017-11-08 | Disposition: A | Discharge status: Home / Self Care | Payer: Self-pay | Source: Ambulatory Visit | Attending: Interventional Cardiology | Admitting: Interventional Cardiology

## 2017-11-11 ENCOUNTER — Encounter (HOSPITAL_COMMUNITY)
Admission: RE | Admit: 2017-11-11 | Discharge: 2017-11-11 | Disposition: A | Discharge status: Home / Self Care | Payer: Medicare Other | Source: Ambulatory Visit | Attending: Interventional Cardiology | Admitting: Interventional Cardiology

## 2017-11-12 ENCOUNTER — Other Ambulatory Visit: Payer: Self-pay | Admitting: Oncology

## 2017-11-12 DIAGNOSIS — Z955 Presence of coronary angioplasty implant and graft: Secondary | ICD-10-CM

## 2017-11-13 ENCOUNTER — Encounter (HOSPITAL_COMMUNITY)
Admission: RE | Admit: 2017-11-13 | Discharge: 2017-11-13 | Disposition: A | Discharge status: Home / Self Care | Payer: Self-pay | Source: Ambulatory Visit | Attending: Interventional Cardiology | Admitting: Interventional Cardiology

## 2017-11-15 ENCOUNTER — Encounter (HOSPITAL_COMMUNITY): Payer: Self-pay

## 2017-11-18 ENCOUNTER — Encounter (HOSPITAL_COMMUNITY): Payer: Self-pay

## 2017-11-20 ENCOUNTER — Encounter (HOSPITAL_COMMUNITY): Payer: Self-pay

## 2017-11-22 ENCOUNTER — Encounter (HOSPITAL_COMMUNITY): Payer: Self-pay

## 2017-11-25 ENCOUNTER — Encounter (HOSPITAL_COMMUNITY)
Admission: RE | Admit: 2017-11-25 | Discharge: 2017-11-25 | Disposition: A | Discharge status: Home / Self Care | Payer: Self-pay | Source: Ambulatory Visit | Attending: Interventional Cardiology | Admitting: Interventional Cardiology

## 2017-11-27 ENCOUNTER — Encounter (HOSPITAL_COMMUNITY)
Admission: RE | Admit: 2017-11-27 | Discharge: 2017-11-27 | Disposition: A | Discharge status: Home / Self Care | Payer: Self-pay | Source: Ambulatory Visit | Attending: Interventional Cardiology | Admitting: Interventional Cardiology

## 2017-11-29 ENCOUNTER — Encounter (HOSPITAL_COMMUNITY)
Admission: RE | Admit: 2017-11-29 | Discharge: 2017-11-29 | Disposition: A | Discharge status: Home / Self Care | Payer: Self-pay | Source: Ambulatory Visit | Attending: Interventional Cardiology | Admitting: Interventional Cardiology

## 2017-11-29 DIAGNOSIS — I214 Non-ST elevation (NSTEMI) myocardial infarction: Secondary | ICD-10-CM | POA: Insufficient documentation

## 2017-11-29 DIAGNOSIS — Z955 Presence of coronary angioplasty implant and graft: Secondary | ICD-10-CM | POA: Insufficient documentation

## 2017-12-02 ENCOUNTER — Encounter (HOSPITAL_COMMUNITY)
Admission: RE | Admit: 2017-12-02 | Discharge: 2017-12-02 | Disposition: A | Discharge status: Home / Self Care | Payer: Self-pay | Source: Ambulatory Visit | Attending: Interventional Cardiology | Admitting: Interventional Cardiology

## 2017-12-17 ENCOUNTER — Ambulatory Visit: Payer: Self-pay | Admitting: Obstetrics & Gynecology

## 2017-12-25 ENCOUNTER — Encounter (HOSPITAL_COMMUNITY)
Admission: RE | Admit: 2017-12-25 | Discharge: 2017-12-25 | Disposition: A | Discharge status: Home / Self Care | Payer: Self-pay | Source: Ambulatory Visit | Attending: Interventional Cardiology | Admitting: Interventional Cardiology

## 2017-12-27 ENCOUNTER — Encounter (HOSPITAL_COMMUNITY): Payer: Self-pay

## 2018-01-01 ENCOUNTER — Encounter (HOSPITAL_COMMUNITY)
Admission: RE | Admit: 2018-01-01 | Discharge: 2018-01-01 | Disposition: A | Discharge status: Home / Self Care | Payer: Self-pay | Source: Ambulatory Visit | Attending: Interventional Cardiology | Admitting: Interventional Cardiology

## 2018-01-01 DIAGNOSIS — Z955 Presence of coronary angioplasty implant and graft: Secondary | ICD-10-CM | POA: Insufficient documentation

## 2018-01-01 DIAGNOSIS — I214 Non-ST elevation (NSTEMI) myocardial infarction: Secondary | ICD-10-CM | POA: Insufficient documentation

## 2018-01-03 ENCOUNTER — Encounter (HOSPITAL_COMMUNITY)
Admission: RE | Admit: 2018-01-03 | Discharge: 2018-01-03 | Disposition: A | Discharge status: Home / Self Care | Payer: Self-pay | Source: Ambulatory Visit | Attending: Interventional Cardiology | Admitting: Interventional Cardiology

## 2018-01-06 ENCOUNTER — Encounter (HOSPITAL_COMMUNITY)
Admission: RE | Admit: 2018-01-06 | Discharge: 2018-01-06 | Disposition: A | Discharge status: Home / Self Care | Payer: Medicare Other | Source: Ambulatory Visit | Attending: Interventional Cardiology | Admitting: Interventional Cardiology

## 2018-01-08 ENCOUNTER — Encounter (HOSPITAL_COMMUNITY)
Admission: RE | Admit: 2018-01-08 | Discharge: 2018-01-08 | Disposition: A | Discharge status: Home / Self Care | Payer: Self-pay | Source: Ambulatory Visit | Attending: Interventional Cardiology | Admitting: Interventional Cardiology

## 2018-01-08 ENCOUNTER — Other Ambulatory Visit: Payer: Self-pay | Admitting: Cardiology

## 2018-01-10 ENCOUNTER — Encounter (HOSPITAL_COMMUNITY): Payer: Self-pay

## 2018-01-10 ENCOUNTER — Ambulatory Visit: Payer: Medicare Other | Admitting: Obstetrics & Gynecology

## 2018-01-13 ENCOUNTER — Encounter (HOSPITAL_COMMUNITY): Payer: Self-pay

## 2018-01-15 ENCOUNTER — Encounter (HOSPITAL_COMMUNITY)
Admission: RE | Admit: 2018-01-15 | Discharge: 2018-01-15 | Disposition: A | Discharge status: Home / Self Care | Payer: Self-pay | Source: Ambulatory Visit | Attending: Interventional Cardiology | Admitting: Interventional Cardiology

## 2018-01-17 ENCOUNTER — Encounter (HOSPITAL_COMMUNITY): Payer: Self-pay

## 2018-01-19 ENCOUNTER — Other Ambulatory Visit: Payer: Self-pay | Admitting: Oncology

## 2018-01-20 ENCOUNTER — Encounter (HOSPITAL_COMMUNITY)
Admission: RE | Admit: 2018-01-20 | Discharge: 2018-01-20 | Disposition: A | Discharge status: Home / Self Care | Payer: Medicare Other | Source: Ambulatory Visit | Attending: Interventional Cardiology | Admitting: Interventional Cardiology

## 2018-01-22 ENCOUNTER — Ambulatory Visit (HOSPITAL_COMMUNITY): Admission: RE | Admit: 2018-01-22 | Payer: Medicare Other | Source: Ambulatory Visit

## 2018-01-22 ENCOUNTER — Telehealth (HOSPITAL_COMMUNITY): Payer: Self-pay | Admitting: Internal Medicine

## 2018-01-22 ENCOUNTER — Encounter (HOSPITAL_COMMUNITY): Payer: Self-pay

## 2018-01-22 ENCOUNTER — Other Ambulatory Visit: Payer: Self-pay

## 2018-01-22 ENCOUNTER — Inpatient Hospital Stay: Payer: Medicare Other | Attending: Oncology

## 2018-01-22 DIAGNOSIS — C50411 Malignant neoplasm of upper-outer quadrant of right female breast: Secondary | ICD-10-CM | POA: Insufficient documentation

## 2018-01-23 ENCOUNTER — Other Ambulatory Visit: Payer: Self-pay

## 2018-01-23 DIAGNOSIS — C50411 Malignant neoplasm of upper-outer quadrant of right female breast: Secondary | ICD-10-CM

## 2018-01-23 DIAGNOSIS — Z170 Estrogen receptor positive status [ER+]: Principal | ICD-10-CM

## 2018-01-24 ENCOUNTER — Encounter (HOSPITAL_COMMUNITY): Payer: Self-pay

## 2018-01-24 ENCOUNTER — Telehealth: Payer: Self-pay | Admitting: Adult Health

## 2018-01-24 ENCOUNTER — Ambulatory Visit (HOSPITAL_COMMUNITY)
Admission: RE | Admit: 2018-01-24 | Discharge: 2018-01-24 | Disposition: A | Discharge status: Home / Self Care | Payer: Medicare Other | Source: Ambulatory Visit | Attending: Oncology | Admitting: Oncology

## 2018-01-24 ENCOUNTER — Inpatient Hospital Stay: Payer: Medicare Other

## 2018-01-24 DIAGNOSIS — C50411 Malignant neoplasm of upper-outer quadrant of right female breast: Secondary | ICD-10-CM

## 2018-01-24 DIAGNOSIS — Z955 Presence of coronary angioplasty implant and graft: Secondary | ICD-10-CM | POA: Diagnosis not present

## 2018-01-24 DIAGNOSIS — R918 Other nonspecific abnormal finding of lung field: Secondary | ICD-10-CM | POA: Insufficient documentation

## 2018-01-24 DIAGNOSIS — Z170 Estrogen receptor positive status [ER+]: Secondary | ICD-10-CM | POA: Insufficient documentation

## 2018-01-24 DIAGNOSIS — I2510 Atherosclerotic heart disease of native coronary artery without angina pectoris: Secondary | ICD-10-CM

## 2018-01-24 DIAGNOSIS — E782 Mixed hyperlipidemia: Secondary | ICD-10-CM

## 2018-01-24 LAB — CBC WITH DIFFERENTIAL (CANCER CENTER ONLY)
BASOS ABS: 0 10*3/uL (ref 0.0–0.1)
Basophils Relative: 1 %
EOS ABS: 0.2 10*3/uL (ref 0.0–0.5)
EOS PCT: 4 %
HCT: 38.6 % (ref 34.8–46.6)
Hemoglobin: 13 g/dL (ref 11.6–15.9)
Lymphocytes Relative: 22 %
Lymphs Abs: 1.2 10*3/uL (ref 0.9–3.3)
MCH: 32.7 pg (ref 25.1–34.0)
MCHC: 33.7 g/dL (ref 31.5–36.0)
MCV: 96.8 fL (ref 79.5–101.0)
MONO ABS: 0.4 10*3/uL (ref 0.1–0.9)
Monocytes Relative: 7 %
Neutro Abs: 3.7 10*3/uL (ref 1.5–6.5)
Neutrophils Relative %: 66 %
PLATELETS: 211 10*3/uL (ref 145–400)
RBC: 3.99 MIL/uL (ref 3.70–5.45)
RDW: 13.7 % (ref 11.2–14.5)
WBC: 5.6 10*3/uL (ref 3.9–10.3)

## 2018-01-24 LAB — CMP (CANCER CENTER ONLY)
ALT: 46 U/L — AB (ref 0–44)
AST: 69 U/L — ABNORMAL HIGH (ref 15–41)
Albumin: 3.6 g/dL (ref 3.5–5.0)
Alkaline Phosphatase: 104 U/L (ref 38–126)
Anion gap: 9 (ref 5–15)
BILIRUBIN TOTAL: 0.6 mg/dL (ref 0.3–1.2)
BUN: 8 mg/dL (ref 8–23)
CO2: 25 mmol/L (ref 22–32)
CREATININE: 0.79 mg/dL (ref 0.44–1.00)
Calcium: 9 mg/dL (ref 8.9–10.3)
Chloride: 107 mmol/L (ref 98–111)
GFR, Est AFR Am: >60 mL/min (ref >60)
GLUCOSE: 99 mg/dL (ref 70–99)
Potassium: 4 mmol/L (ref 3.5–5.1)
Sodium: 141 mmol/L (ref 135–145)
TOTAL PROTEIN: 6.7 g/dL (ref 6.5–8.1)

## 2018-01-24 MED ORDER — IOHEXOL 300 MG/ML  SOLN
75.0000 mL | Freq: Once | INTRAMUSCULAR | Status: AC | PRN
  Administered 2018-01-24: 75 mL via INTRAVENOUS

## 2018-01-24 NOTE — Telephone Encounter (Signed)
Called patient and reviewed her scans with her.  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  She was notably anxious after hearing this asking, why has nobody told her before about any nodules.  I explained to her that these were very small, and are likely due to previous infection.  She notes previous varicella pneumonia.   I assured her that these areas though showed up on the scans were not concerning for cancer.  She asked if she should have them checked.  I told her the way things like that are evaluated is by regular CT scans, and once they are stable for so many years they are considered benign.  Patient was still very anxious about this.  I let her know that I would talk to Dr. Jana Hakim about her concern about not previously knowing about these areas in the lung.  She wanted to know if I was concerned about the results and I again reassured her that the CT scan was normal and not concerning for cancer.  She then noted that she would try to get our conversation out of her head and noted she looked forward to talking to Dr. Jana Hakim.  Brianna Bihari, NP

## 2018-01-27 ENCOUNTER — Other Ambulatory Visit: Payer: Self-pay | Admitting: Oncology

## 2018-01-27 ENCOUNTER — Encounter (HOSPITAL_COMMUNITY): Payer: Self-pay

## 2018-01-27 NOTE — Progress Notes (Signed)
I called [name] to let her know that her CT scan is unchanged as compared to 2 years ago.  This is very favorable.  She sees me next week to discuss further.

## 2018-01-29 ENCOUNTER — Encounter (HOSPITAL_COMMUNITY)
Admission: RE | Admit: 2018-01-29 | Discharge: 2018-01-29 | Disposition: A | Discharge status: Home / Self Care | Payer: Self-pay | Source: Ambulatory Visit | Attending: Interventional Cardiology | Admitting: Interventional Cardiology

## 2018-01-29 DIAGNOSIS — I214 Non-ST elevation (NSTEMI) myocardial infarction: Secondary | ICD-10-CM | POA: Insufficient documentation

## 2018-01-29 DIAGNOSIS — Z955 Presence of coronary angioplasty implant and graft: Secondary | ICD-10-CM | POA: Insufficient documentation

## 2018-01-30 ENCOUNTER — Telehealth: Payer: Self-pay | Admitting: Interventional Cardiology

## 2018-01-30 NOTE — Telephone Encounter (Signed)
Will forward this to Varanasi's nurse to address when she is back in the office

## 2018-01-30 NOTE — Telephone Encounter (Signed)
New Message   Diane with the [facility] called to advise that it may appear that they did the lipid panel on the patient last week. But they did not. They just wanted Dr. Irish Lack to know that the patient still has not had the lipid panel.

## 2018-01-31 ENCOUNTER — Ambulatory Visit
Admission: RE | Admit: 2018-01-31 | Discharge: 2018-01-31 | Disposition: A | Discharge status: Home / Self Care | Payer: Medicare Other | Source: Ambulatory Visit | Attending: Oncology | Admitting: Oncology

## 2018-01-31 ENCOUNTER — Other Ambulatory Visit: Payer: Medicare Other

## 2018-01-31 DIAGNOSIS — C50411 Malignant neoplasm of upper-outer quadrant of right female breast: Secondary | ICD-10-CM

## 2018-01-31 DIAGNOSIS — Z955 Presence of coronary angioplasty implant and graft: Secondary | ICD-10-CM

## 2018-01-31 DIAGNOSIS — Z170 Estrogen receptor positive status [ER+]: Secondary | ICD-10-CM

## 2018-01-31 NOTE — Telephone Encounter (Signed)
Called patient and made her aware that the [facility] did not drawn her LIPIDS. Patient states that she is going to Dr. Lina Sar office next week and asked that I fax over the order for her Fasting Lipids to be done there. Orders faxed to Dr. Lina Sar office. Confirmation received that fax was successful.

## 2018-01-31 NOTE — Progress Notes (Signed)
[place]  [phone] [phone]     ID: [name] DOB: 25-Mar-1946  MR#: [id number]  MPN#:[id number]  Patient Care Team: Seward Carol, MD as PCP - General (Internal Medicine) Yvonne Petite, Virgie Dad, MD as Consulting Physician (Oncology) Fanny Skates, MD as Consulting Physician (General Surgery) Luberta Robertson, MD (Radiology) Hennie Duos, MD as Consulting Physician (Rheumatology) Domingo Pulse, MD (Urology) Druscilla Brownie, MD as Consulting Physician (Dermatology) Luberta Mutter, MD as Consulting Physician (Ophthalmology) Magnus Sinning, MD as Consulting Physician (Physical Medicine and Rehabilitation) Benson Norway, RN as Registered Nurse (Oncology) Megan Salon, MD as Consulting Physician (Gynecology) Loney Loh, MD (Dermatology) OTHER MD:  CHIEF COMPLAINT: Estrogen receptor positive breast cancer  CURRENT TREATMENT:  Anastrozole  INTERVAL HISTORY: [name] returns today for follow-up and treatment of her estrogen receptor positive breast cancer. She continues on anastrozole, with good tolerance. She has occasional hot flashes and vaginal dryness. She has increased arthralgias and myalgias, but she attributes this to her fibromyalgia.  Since her last visit, she underwent diagnostic bilateral mammography with CAD and tomography on 01/31/2018 at [place] showing: breast density category B. There was no evidence of malignancy.   She also completed a chest CT on 01/24/2018 showing: No significant interval change from 11/28/2016. Unchanged appearance of diffuse bilateral ground-glass micro nodularity throughout both lungs. Favor sequelae of inflammatory or infectious process. Similar post surgical and therapeutic changes involving the right breast and axilla   REVIEW OF SYSTEMS: [name] reports that she had pneumonia several times. She was concerned about her CT scan. She has occasional nausea and fatigue. She  takes compazine for nausea, but zofran makes her constipated. She has leg cramps and restless legs syndrome. She denies unusual headaches, visual changes, vomiting, or dizziness. There has been no unusual cough, phlegm production, or pleurisy. There has been no change in bowel or bladder habits. She denies unexplained weight loss, bleeding, rash, or fever. A detailed review of systems was otherwise stable.    BREAST CANCER HISTORY: From the original intake note:  [name] had routine mammographic screening in 08/18/2015 at the [facility], showing a right breast mass and possible axillary adenopathy. On 08/23/2015 she underwent bilateral diagnostic mammography with tomography and bilateral breast ultrasonography. The breast density was category B in the right breast there was a spiculated mass in the upper-outer quadrant measuring 1.2 cm and associated with pleomorphic calcifications. There were calcifications extending anteriorly and posteriorly from the mass worrisome for ductal carcinoma in situ. The total area in question is about 3 cm. There were also multiple enlarged right axillary lymph nodes. The mass was palpated as an area of fullness at the 12:00 position of the breast 3 cm from the nipple. There were palpable enlarged right axillary lymph nodes in the right axilla. Ultrasonography of the right breast confirmed an irregular hypoechoic mass in the area in question measuring 1.6 cm. There were multiple abnormal appearing enlarged right axillary lymph nodes, the largest measuring 2.3 cm.  In the left breast, mammography showed an area of architectural distortion associated with the left nipple. This was not palpable. Ultrasonography of the left breast found no correlate. This area of the left breast was biopsied under tomography 08/24/2015. This showed (SAA [id number]) a complex sclerosing lesion and fibroadenoma.  On 08/23/2015 the patient underwent biopsy of the right breast mass in 1 of the  abnormal right axillary lymph nodes. Both were positive for invasive ductal carcinoma, grade 2 both estrogen receptor  100% positive and both progesterone receptor 5% positive with MIB-1 of 20-30% (the higher 1 obtained from the lymph node). Both biopsies were HER-2 nonamplified, the breast being ratio 1.27 and number per cell 1.90, and the lymph node 1 ratio 1.24 and number per cell 3.0.  On 08/26/2015 the patient underwent bilateral breast MRI. In the left breast there was no suspicious enhancement. The left axilla was benign. In the right breast there was a mass described as in the upper inner quadrant measuring 2.3 cm. Less than a centimeter away from this mass there was a satellite nodule measuring 0.9 cm. In the right axilla there were multiple enlarged right axillary lymph nodes, including a confluent region measuring up to 4 cm. There were also enlarged level II lymph nodes present measuring up to 2.1 cm.  The patient's subsequent history is as detailed below  PAST MEDICAL HISTORY: Past Medical History:  Diagnosis Date  . Anxiety   . Arthritis 2008  . Asthma 1953   age 54  . Breast cancer ([place])   . Cancer [facility])    breast cancer  . Chronic vaginitis 1998  . Fibromyalgia 1982  . GERD (gastroesophageal reflux disease)    since at least 2011  . Hemorrhoids 2009  . History of radiation therapy 01/18/16-03/05/16   right breast/nodal areas 45 Gy, right breast boost 16 Gy  . Hypoglycemia 2003  . Hypothyroidism 1964   age 29  . Interstitial cystitis 1998  . Irritable bowel syndrome 06/14/2009   Qualifier: Diagnosis of  By: Patsy Baltimore RN, Langley Gauss    . Menopausal symptoms   . Osteoporosis 12/24/2006  . Personal history of chemotherapy   . Personal history of radiation therapy   . Pneumonia 1979   varicella  . PONV (postoperative nausea and vomiting) 1979    PAST SURGICAL HISTORY: Past Surgical History:  Procedure Laterality Date  . BLADDER HYPEREXTENSION     X4, prior to 01/2006  .  BLADDER SUSPENSION    . BREAST BIOPSY    . BREAST LUMPECTOMY Right    2017  . BREAST LUMPECTOMY WITH NEEDLE LOCALIZATION AND AXILLARY LYMPH NODE DISSECTION Right 12/05/2015   Procedure: RIGHT BREAST NEEDLE LOCALIZED (WIRES X 2) LUMPECTOMY WITH AXILLARY LYMPH NODE DISSECTION;  Surgeon: Fanny Skates, MD;  Location: [place];  Service: General;  Laterality: Right;  . BREAST LUMPECTOMY WITH RADIOACTIVE SEED LOCALIZATION Left 12/05/2015   Procedure: RADIOACTIVE SEED GUIDED LEFT BREAST LUMPECTOMY;  Surgeon: Fanny Skates, MD;  Location: [place];  Service: General;  Laterality: Left;  . BREAST SURGERY    . BUNIONECTOMY     prior to 01/2006  . CARDIAC CATHETERIZATION    . CESAREAN SECTION  1979  . CHOLECYSTECTOMY  02/08/2006   DR. INGRAM  . COLONOSCOPY    . CORONARY STENT INTERVENTION N/A 01/01/2017   Procedure: CORONARY STENT INTERVENTION;  Surgeon: Jettie Booze, MD;  Location: [place] CV LAB;  Service: Cardiovascular;  Laterality: N/A;  . DENTAL SURGERY  2009-2016   13 dental implants  . LEFT HEART CATH AND CORONARY ANGIOGRAPHY N/A 01/01/2017   Procedure: LEFT HEART CATH AND CORONARY ANGIOGRAPHY;  Surgeon: Jettie Booze, MD;  Location: [place] [facility];  Service: Cardiovascular;  Laterality: N/A;  . LEG SURGERY  07/06/2004   Broken tibia  . NASAL SINUS SURGERY     X4; prior to 01/2006  . PORT-A-CATH REMOVAL Right 12/05/2015   Procedure: REMOVAL PORT-A-CATH;  Surgeon: Fanny Skates, MD;  Location: [place];  Service:  General;  Laterality: Right;  . PORTACATH PLACEMENT Left 09/08/2015   Procedure: INSERTION OF PORT-A-CATH ;  Surgeon: Fanny Skates, MD;  Location: [facility] OR;  Service: General;  Laterality: Left;    FAMILY HISTORY Family History  Problem Relation Age of Onset  . Hypertension Mother   . Heart disease Mother        d. 39  . Parkinsonism Father        d. 36  . Heart disease Sister   . Breast cancer Sister 63       s/p mastectomy; reportedly negative BRCA1/2 testing  approx 7 years ago  . Melanoma Sister        d. 8s; hx of incresed sun exposure  . Cancer Sister        dx. sarcoma of her face in her 70s; treated w/ MOHS surgery  . Parkinsonism Paternal Aunt        d. 90s  . Parkinsonism Paternal Uncle        d. late 39s  . Kidney failure Maternal Grandmother 58  . Heart attack Maternal Grandfather        d. 6s  . Heart disease Paternal Grandmother        d. 34  . Breast cancer Other 32       maternal great aunt (MGF's sister)  . Kidney cancer Paternal Uncle 26       d. 40s; unsure about smoking status  The patient's father died at age 43 from complications of Parkinson's disease. The patient's mother died age 11 with heart disease. Lien had no brothers. She had 2 sisters. One sister was diagnosed with breast cancer at age 60. She was tested for the BRCA gene and was negative. The same sister also had melanoma diagnosed in her 47s. The patient has one maternal great aunt diagnosed with breast cancer around age 10. There is no history of ovarian cancer in the family  GYNECOLOGIC HISTORY:  Patient's last menstrual period was 04/30/1996 (approximate). Menarche age 62, first live birth age 71. The patient is GX P2. She stopped having periods in her early 70s and took hormone replacement until her breast cancer diagnosis, April 2017.  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 He has been in remission for more than 10 years. Their daughter [name] lives in [place] were she is Garment/textile technologist of the [employer]. Daughter [name] is an infectious disease M.D. working and teaching at [employer] in [place]. The patient has 2 grandchildren. The family attends [employer]: Not in place   HEALTH MAINTENANCE: Social History    Tobacco Use  . Smoking status: Former Smoker    Packs/day: 0.25    Years: 15.00    Pack years: 3.75    Types: Cigarettes  . Smokeless tobacco: Never Used  . Tobacco comment: social smoker/"on and off"  Substance Use Topics  . Alcohol use: Yes    Alcohol/week: 0.0 standard drinks    Comment: social  . Drug use: No     Colonoscopy: 2016/ [facility]  PAP: 2015  Bone density: October 2018, T-score: -2.7  Lipid panel:  Allergies  Allergen Reactions  . Chlorhexidine Other (See Comments)    [place] IS OK. The CLOTH WIPE causes reaction. Causes itching all over the  area  . Other Rash    Adhesive tape    Current Outpatient Medications  Medication Sig Dispense Refill  . albuterol (PROVENTIL HFA;VENTOLIN HFA) 108 (90 Base) MCG/ACT inhaler Inhale 1-2 puffs into the lungs every 6 (six) hours as needed for wheezing or shortness of breath. Started in 1998    . anastrozole (ARIMIDEX) 1 MG tablet TAKE 1 TABLET(1 MG) BY MOUTH DAILY 90 tablet 0  . aspirin EC 81 MG EC tablet Take 1 tablet (81 mg total) by mouth daily. 30 tablet 0  . BRILINTA 90 MG TABS tablet TAKE 1 TABLET(90 MG) BY MOUTH TWICE DAILY 60 tablet 9  . buPROPion (WELLBUTRIN XL) 300 MG 24 hr tablet Take 300 mg by mouth daily.     . Calcium Carbonate-Vitamin D (CALCIUM + D PO) Take 1 tablet by mouth at bedtime. Started approximately 1998    . carisoprodol (SOMA) 350 MG tablet Take 350 mg by mouth 2 (two) times daily as needed for muscle spasms. Started in 2008.  2  . diazepam (VALIUM) 5 MG tablet Take 5 mg by mouth every 6 (six) hours as needed for muscle spasms (interstitial cystitis). Started approximately 2008.    . furosemide (LASIX) 20 MG tablet Take 1 tablet (20 mg total) by mouth daily as needed (shortness of breath). 30 tablet 3  . HYDROcodone-acetaminophen (NORCO) 10-325 MG tablet Take 1 tablet by mouth every 4 (four) hours as needed for moderate pain or severe pain (as needed for fibromyalgia and IC).     . hydrOXYzine  (ATARAX/VISTARIL) 25 MG tablet Take 1 tablet by mouth at bedtime.    Marland Kitchen levothyroxine (SYNTHROID, LEVOTHROID) 75 MCG tablet Take 75 mcg by mouth daily.  2  . LORazepam (ATIVAN) 2 MG tablet Take 1 tablet by mouth daily as needed for anxiety.   5  . metoprolol succinate (TOPROL-XL) 25 MG 24 hr tablet TAKE 1/2 TABLET(12.5 MG) BY MOUTH DAILY 45 tablet 3  . minoxidil (ROGAINE) 2 % external solution Apply topically 2 (two) times daily.    . nitroGLYCERIN (NITROSTAT) 0.4 MG SL tablet PLACE 1 TABLET UNDER TONGUE EVERY FIVE MINUTES AS NEEDED FOR CHEST PAIN 25 tablet 3  . NONFORMULARY OR COMPOUNDED ITEM Vit E vaginal suppositories 259m/ml.  One pv three times weekly.  #36/4RF 36 each 4  . ondansetron (ZOFRAN) 4 MG tablet Take 1 tablet by mouth daily as needed for nausea or vomiting.   3  . pantoprazole (PROTONIX) 40 MG tablet Take 40 mg by mouth 2 (two) times daily. Started in 2012.    . potassium chloride (K-DUR) 10 MEQ tablet TAKE 1 TABLET BY MOUTH AS NEEDED. TAKE WITH LASIX 30 tablet 11  . prochlorperazine (COMPAZINE) 10 MG tablet Take 1 tablet (10 mg total) by mouth every 6 (six) hours as needed (Nausea or vomiting). 30 tablet 1  . rosuvastatin (CRESTOR) 20 MG tablet Take 1 tablet (20 mg total) by mouth daily. 90 tablet 3  . terconazole (TERAZOL 7) 0.4 % vaginal cream Place 1 applicator vaginally 2 (two) times daily as needed (irritation).     . Vaginal Lubricant (REPLENS VA) Place vaginally daily.    . Vitamin D, Ergocalciferol, (DRISDOL) 50000 units CAPS capsule TAKE 1 CAPSULE BY MOUTH EVERY 7 DAYS 12 capsule 4  . zolpidem (AMBIEN) 10 MG tablet Take 10 mg by mouth at bedtime as needed for sleep.      No current facility-administered medications for this visit.      OBJECTIVE: Middle-aged white woman  who appears well  Vitals:   02/03/18 1153  BP: 128/73  Pulse: (!) 56  Resp: 18  Temp: 98.7 F (37.1 C)  SpO2: 96%     Body mass index is 26.06 kg/m.    ECOG FS:1 - Symptomatic but completely  ambulatory  Sclerae unicteric, pupils round and equal Oropharynx clear and moist No cervical or supraclavicular adenopathy Lungs no rales or rhonchi Heart regular rate and rhythm Abd soft, nontender, positive bowel sounds MSK no focal spinal tenderness, no upper extremity lymphedema Neuro: nonfocal, well oriented, appropriate affect Breasts: The right breast has undergone lumpectomy and radiation.  It is imaged below.  It continues to improve in appearance.  There is no evidence of disease recurrence.  Left breast is benign.  Both axillae are benign.  Right breast 08/15/2016     Right Breast 02/03/2018      LAB RESULTS:  CMP     Component Value Date/Time   NA 141 01/24/2018 0821   NA 138 03/11/2017 1524   K 4.0 01/24/2018 0821   K 3.8 03/11/2017 1524   CL 107 01/24/2018 0821   CO2 25 01/24/2018 0821   CO2 25 03/11/2017 1524   GLUCOSE 99 01/24/2018 0821   GLUCOSE 136 03/11/2017 1524   BUN 8 01/24/2018 0821   BUN 13.2 03/11/2017 1524   CREATININE 0.79 01/24/2018 0821   CREATININE 0.8 03/11/2017 1524   CALCIUM 9.0 01/24/2018 0821   CALCIUM 9.6 03/11/2017 1524   PROT 6.7 01/24/2018 0821   PROT 7.6 03/11/2017 1524   ALBUMIN 3.6 01/24/2018 0821   ALBUMIN 3.8 03/11/2017 1524   AST 69 (H) 01/24/2018 0821   AST 24 03/11/2017 1524   ALT 46 (H) 01/24/2018 0821   ALT 38 03/11/2017 1524   ALKPHOS 104 01/24/2018 0821   ALKPHOS 166 (H) 03/11/2017 1524   BILITOT 0.6 01/24/2018 0821   BILITOT 0.44 03/11/2017 1524   GFRNONAA >60 01/24/2018 0821   GFRAA >60 01/24/2018 0821    INo results found for: SPEP, UPEP  Lab Results  Component Value Date   WBC 5.6 01/24/2018   NEUTROABS 3.7 01/24/2018   HGB 13.0 01/24/2018   HCT 38.6 01/24/2018   MCV 96.8 01/24/2018   PLT 211 01/24/2018      Chemistry      Component Value Date/Time   NA 141 01/24/2018 0821   NA 138 03/11/2017 1524   K 4.0 01/24/2018 0821   K 3.8 03/11/2017 1524   CL 107 01/24/2018 0821   CO2 25  01/24/2018 0821   CO2 25 03/11/2017 1524   BUN 8 01/24/2018 0821   BUN 13.2 03/11/2017 1524   CREATININE 0.79 01/24/2018 0821   CREATININE 0.8 03/11/2017 1524      Component Value Date/Time   CALCIUM 9.0 01/24/2018 0821   CALCIUM 9.6 03/11/2017 1524   ALKPHOS 104 01/24/2018 0821   ALKPHOS 166 (H) 03/11/2017 1524   AST 69 (H) 01/24/2018 0821   AST 24 03/11/2017 1524   ALT 46 (H) 01/24/2018 0821   ALT 38 03/11/2017 1524   BILITOT 0.6 01/24/2018 0821   BILITOT 0.44 03/11/2017 1524       No results found for: LABCA2  No components found for: LABCA125  No results for input(s): INR in the last 168 hours.  Urinalysis    Component Value Date/Time   COLORURINE YELLOW 01/03/2017 1205   APPEARANCEUR HAZY (A) 01/03/2017 1205   LABSPEC 1.006 01/03/2017 1205   PHURINE 7.0 01/03/2017 1205  GLUCOSEU NEGATIVE 01/03/2017 1205   HGBUR SMALL (A) 01/03/2017 1205   BILIRUBINUR NEGATIVE 01/03/2017 1205   BILIRUBINUR n 10/01/2016 1316   KETONESUR NEGATIVE 01/03/2017 1205   PROTEINUR NEGATIVE 01/03/2017 1205   UROBILINOGEN 0.2 10/01/2016 1316   NITRITE NEGATIVE 01/03/2017 1205   LEUKOCYTESUR LARGE (A) 01/03/2017 1205      ELIGIBLE FOR AVAILABLE RESEARCH PROTOCOL: PALLAS, neuropathy study  STUDIES: Ct Chest W Contrast  Result Date: 01/24/2018 CLINICAL DATA:  Right breast cancer. Status post chemo XRT and surgery. Former smoker. EXAM: CT CHEST WITH CONTRAST TECHNIQUE: Multidetector CT imaging of the chest was performed during intravenous contrast administration. CONTRAST:  68m OMNIPAQUE IOHEXOL 300 MG/ML  SOLN COMPARISON:  11/28/2016 FINDINGS: Cardiovascular: The heart size appears normal. No pericardial effusion. Aortic atherosclerosis. Calcifications within the RCA, LAD and left circumflex coronary arteries noted. Mediastinum/Nodes: Normal appearance of the thyroid gland. The trachea appears patent and is midline. Normal appearance of the esophagus. No enlarged axillary or  supraclavicular adenopathy. No mediastinal or hilar adenopathy identified. Lungs/Pleura: No pleural effusion. No airspace consolidation, atelectasis or pneumothorax identified. Scattered small ground-glassnodules are noted in both lungs, many of which have a cavitary appearance. The index nodule within the left upper lobe appears unchanged measuring 3 mm, image 27/7. Index nodule within the right upper lobe is unchanged measuring 3 mm, image 29/7. No new suspicious solid nodule identified. Upper Abdomen: No acute findings. Previous cholecystectomy with increase caliber of the intrahepatic bile ducts. Musculoskeletal: Skin thickening and postsurgical change involving the right breast are again identified and appears similar to the comparison exam. Right axillary nodal dissection noted. There is a scoliosis deformity with degenerative disc disease involving the thoracolumbar spine. No aggressive lytic or sclerotic bone lesions identified. IMPRESSION: 1. No significant interval change from 11/28/2016. 2. Unchanged appearance of diffuse bilateral ground-glass micro nodularity throughout both lungs. Favor sequelae of inflammatory or infectious process. 3. Similar post surgical and therapeutic changes involving the right breast and axilla. Electronically Signed   By: TKerby MoorsM.D.   On: 01/24/2018 14:19   Mm Diag Breast Tomo Bilateral  Result Date: 01/31/2018 CLINICAL DATA:  72year old female for annual bilateral mammogram. History of RIGHT breast cancer, lumpectomy and radiation in 2017. History of benign LEFT breast excisional biopsy. EXAM: DIGITAL DIAGNOSTIC BILATERAL MAMMOGRAM WITH CAD AND TOMO COMPARISON:  Previous exam(s). ACR Breast Density Category b: There are scattered areas of fibroglandular density. FINDINGS: 2D and 3D full field views of both breasts and a magnification view of the lumpectomy site demonstrate no suspicious mass, nonsurgical distortion or worrisome calcifications. Surgical and  radiation treatment changes within the RIGHT breast again identified. Surgical changes within the LEFT breast are again noted. Mammographic images were processed with CAD. IMPRESSION: No evidence of breast malignancy. Surgical and radiation changes within the RIGHT breast. RECOMMENDATION: Bilateral diagnostic mammogram in 1 year I have discussed the findings and recommendations with the patient. Results were also provided in writing at the conclusion of the visit. If applicable, a reminder letter will be sent to the patient regarding the next appointment. BI-RADS CATEGORY  2: Benign. Electronically Signed   By: JMargarette CanadaM.D.   On: 01/31/2018 12:45      ASSESSMENT: 72y.o. [place] woman status post right breast upper outer quadrant and right axillary lymph node biopsy 08/23/2015, both positive for a clinical T1 N2, stage IIIA invasive ductal carcinoma, grade 2, strongly estrogen receptor positive, 5% progesterone receptor positive, with an MIB-120-30% and no HER-2 amplification  (a)  biopsy of a 0.9 cm Right breast satellite nodule 09/13/2015 showed invasive ductal carcinoma, grade 2, E-cadherin strongly positive, with a prognostic panel identical to the larger right upper quadrant mass  (1) left breast biopsy 08/24/2015 shows a complex sclerosing lesion, excised 12/05/2015  (2) neoadjuvant chemotherapy consisting of cyclophosphamide and docetaxel every 21 days 4, with OnPro support, started 09/12/2015, completed 11/14/2015  (3) status post right lumpectomy and sentinel lymph node dissection 12/05/2015 for a residual pT2 pN3, stage IIIC invasive ductal carcinoma, grade 2, with a close anterior margin (skin)  (4) adjuvant radiation 01/18/16 - 11/6/17with capecitabine chemo-sensitization   1) Right breast/nodal areas: 45 Gy in 25 fractions                         2) Right breast boost: 16 Gy in 8 fractions  (5) capecitabine started 04/13/2016, discontinued January 2018 with mild/moderate [name]  effects developing  (6) anastrozole started 06/01/2016  (a) bone density January 30, 2017 shows a T score of -2.7 (osteoporosis)  (7) genetics testing ([place]kenazi descent) 02/07/2016 through the [place] offered by [facility] Hope Pigeon, MD) i found no deleterious mutations in  ATM, BARD1, BRCA1, BRCA2, BRIP1, CDH1, CHEK2, FANCC, MLH1, MSH2, MSH6, NBN, PALB2, PMS2, PTEN, RAD51C, RAD51D, TP53, and XRCC2.  This panel also includes deletion/duplication analysis (without sequencing) for one gene, EPCAM.  (8) consented to PALLAS trial   (a) randomized to anastrozole alone I have reviewed the above documentation for accuracy and completeness, and I agree with the above.  PLAN: Adelise is now a little over 2 years out from definitive surgery for her breast cancer with no evidence of disease recurrence.  This is very favorable.  She continues to have problems with fibromyalgia as she did before but she is tolerating anastrozole well except for the usual complaints of mild hot flashes and mild vaginal dryness issues.  She is on a variety of creams to help with the latter.  We just obtain a CT of the chest.  This shows some lung nodules which have not changed in the past 2 years.  They are almost certainly going to be benign.  We can consider obtaining one more CT scan of the chest a year from now.  She does have a slight increase in liver function tests.  These have been normal previously.  We discussed the fact that most likely this is due to medication or to fatty liver.  I am going to check it again mid-November just to make sure there is no trend.  If there is an increase we will obtain an MRI of the liver, but looking through the CT scan of the chest upper abdomen views I do not see any liver abnormality.  She will then see me in December with routine labs  She knows to call for any other issues that may develop before the next visit.   Nettie Cromwell, Virgie Dad, MD  02/03/18  12:09 PM Medical Oncology and Hematology [facility] [address] [place], [place] [address] Tel. [phone]    Fax. [phone]  Alice Rieger, am acting as scribe for Chauncey Cruel MD.  I, Lurline Del MD, have reviewed the above documentation for accuracy and completeness, and I agree with the above.

## 2018-02-03 ENCOUNTER — Other Ambulatory Visit: Payer: Self-pay | Admitting: Oncology

## 2018-02-03 ENCOUNTER — Inpatient Hospital Stay: Payer: Medicare Other | Admitting: *Deleted

## 2018-02-03 ENCOUNTER — Inpatient Hospital Stay: Payer: Medicare Other | Attending: Oncology | Admitting: Oncology

## 2018-02-03 ENCOUNTER — Telehealth: Payer: Self-pay | Admitting: Oncology

## 2018-02-03 DIAGNOSIS — Z79811 Long term (current) use of aromatase inhibitors: Secondary | ICD-10-CM | POA: Diagnosis not present

## 2018-02-03 DIAGNOSIS — Z170 Estrogen receptor positive status [ER+]: Secondary | ICD-10-CM | POA: Diagnosis not present

## 2018-02-03 DIAGNOSIS — Z006 Encounter for examination for normal comparison and control in clinical research program: Secondary | ICD-10-CM | POA: Diagnosis present

## 2018-02-03 DIAGNOSIS — C50411 Malignant neoplasm of upper-outer quadrant of right female breast: Secondary | ICD-10-CM | POA: Diagnosis present

## 2018-02-03 DIAGNOSIS — N898 Other specified noninflammatory disorders of vagina: Secondary | ICD-10-CM | POA: Diagnosis not present

## 2018-02-03 DIAGNOSIS — R7989 Other specified abnormal findings of blood chemistry: Secondary | ICD-10-CM

## 2018-02-03 DIAGNOSIS — R918 Other nonspecific abnormal finding of lung field: Secondary | ICD-10-CM | POA: Insufficient documentation

## 2018-02-03 DIAGNOSIS — Z9221 Personal history of antineoplastic chemotherapy: Secondary | ICD-10-CM | POA: Diagnosis not present

## 2018-02-03 DIAGNOSIS — Z87891 Personal history of nicotine dependence: Secondary | ICD-10-CM | POA: Diagnosis not present

## 2018-02-03 DIAGNOSIS — N951 Menopausal and female climacteric states: Secondary | ICD-10-CM | POA: Insufficient documentation

## 2018-02-03 DIAGNOSIS — M797 Fibromyalgia: Secondary | ICD-10-CM

## 2018-02-03 DIAGNOSIS — Z923 Personal history of irradiation: Secondary | ICD-10-CM | POA: Diagnosis not present

## 2018-02-03 MED ORDER — PROCHLORPERAZINE MALEATE 10 MG PO TABS: 10.0000 mg | ORAL_TABLET | Freq: Four times a day (QID) | ORAL | 1 refills | Status: DC | PRN

## 2018-02-03 MED ORDER — ANASTROZOLE 1 MG PO TABS: 1.0000 mg | ORAL_TABLET | Freq: Every day | ORAL | 4 refills | Status: DC

## 2018-02-03 NOTE — Telephone Encounter (Signed)
Per 10/7 los, gave patient avs and calendar.

## 2018-02-03 NOTE — Research (Signed)
02/03/2018 Patient in to clinic today for Cycle 21 visit assessments; due to patient and MD scheduling conflicts, visit is occurring five days after start of cycle as allowed per protocol visit windows. Patient has recorded her doses of medication since 01/29/18 and will transcribe them on the new diary provided to her today.  Patient returned her completed drug diaries for Cycles 18-20 for anti-hormone therapy, indicating no missed doses. Patient was also given drug diaries to complete for Cycles 21-23. She continues to have intermittent nausea, same as baseline, with occasional use of Compazine. Otherwise, she reports no changes in ongoing previously reported AEs. Based on lab results review and H&P by Dr. Jana Hakim, patient condition is acceptable for continued treatment on study. Plans made for return visit in December 2019, with interim lab check in November per MD discretion. Patient reports her trip to [place] with departure on 02/24/2018 and return on November 12th or 13th.  Cindy S. 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 Grade 1 12/31/16 ongoing No   LV diastolic dysfunction Grade 1 01/02/17 ongoing No   Hyperlipidemia Grade 2 01/02/17 ongoing Yes Statin started 01/01/17 for ACS; Zetia switched to Crestor  Chronic diastolic CHF Grade 2 09/27/03 ongoing No Lasix with prophylactic K-dur  Weight loss Grade 2 03/11/17 ongoing No   Bruising Grade 1 04/01/17 ongoing No Related to Brilinta  Elevated glucose Grade 1 11/04/17 01/24/18 No   Decreased  eGFR Grade 1 11/04/17 01/24/18 No   Hypertension Grade 1 11/04/17 ongoing No   Leg cramps Grade 1 01/12/18 ongoing No   Restless leg syndrome Grade 1 01/12/18 ongoing No   AST increased Grade 1 01/24/18 ongoing No   ALT increased Grade 1 01/24/18 ongoing No   Cindy S. Brigitte Pulse BSN, RN, CCRP 03/20/2018 4:50 PM

## 2018-02-07 ENCOUNTER — Encounter (HOSPITAL_COMMUNITY)
Admission: RE | Admit: 2018-02-07 | Discharge: 2018-02-07 | Disposition: A | Discharge status: Home / Self Care | Payer: Self-pay | Source: Ambulatory Visit | Attending: Interventional Cardiology | Admitting: Interventional Cardiology

## 2018-02-17 ENCOUNTER — Encounter (HOSPITAL_COMMUNITY)
Admission: RE | Admit: 2018-02-17 | Discharge: 2018-02-17 | Disposition: A | Discharge status: Home / Self Care | Payer: Self-pay | Source: Ambulatory Visit | Attending: Interventional Cardiology | Admitting: Interventional Cardiology

## 2018-02-19 ENCOUNTER — Encounter (HOSPITAL_COMMUNITY)
Admission: RE | Admit: 2018-02-19 | Discharge: 2018-02-19 | Disposition: A | Discharge status: Home / Self Care | Payer: Self-pay | Source: Ambulatory Visit | Attending: Cardiology | Admitting: Cardiology

## 2018-03-12 ENCOUNTER — Encounter (HOSPITAL_COMMUNITY): Payer: Self-pay

## 2018-03-12 DIAGNOSIS — I2510 Atherosclerotic heart disease of native coronary artery without angina pectoris: Secondary | ICD-10-CM | POA: Insufficient documentation

## 2018-03-12 DIAGNOSIS — I214 Non-ST elevation (NSTEMI) myocardial infarction: Secondary | ICD-10-CM | POA: Insufficient documentation

## 2018-03-12 DIAGNOSIS — Z539 Procedure and treatment not carried out, unspecified reason: Secondary | ICD-10-CM | POA: Insufficient documentation

## 2018-03-12 DIAGNOSIS — Z955 Presence of coronary angioplasty implant and graft: Secondary | ICD-10-CM | POA: Insufficient documentation

## 2018-03-14 ENCOUNTER — Encounter (HOSPITAL_COMMUNITY): Admission: RE | Admit: 2018-03-14 | Payer: Self-pay | Source: Ambulatory Visit

## 2018-03-17 ENCOUNTER — Telehealth: Payer: Self-pay | Admitting: *Deleted

## 2018-03-17 ENCOUNTER — Encounter (HOSPITAL_COMMUNITY): Payer: Self-pay

## 2018-03-17 NOTE — Telephone Encounter (Signed)
03/17/2018 Received phone call from patient this morning requesting to reschedule tomorrow's lab only appointment. - This is an interim appointment to recheck LFTs, unrelated to AFT-05 PALLAS study participation. Patient is on endocrine therapy alone (Arm B) - Patient had planned dental work done on Friday 11/15, following her return from a trip to [place] earlier this month. - Patient reports onset of fever early this morning. She requests changing lab appointment to the afternoon, next Monday or Tuesday (11/25 or 11/26), does not feel like she can come in this week. Instructed patient to contact her dental provider regarding onset of fever s/p dental extraction.  Please advise if okay to reschedule to next week, and I will put in a scheduling request.  Cindy S. Brigitte Pulse BSN, RN, CCRP 03/17/2018 12:11 PM

## 2018-03-18 ENCOUNTER — Other Ambulatory Visit: Payer: Medicare Other

## 2018-03-18 NOTE — Telephone Encounter (Signed)
Yes may reschedule lab appointment today to next week

## 2018-03-19 ENCOUNTER — Encounter (HOSPITAL_COMMUNITY): Payer: Self-pay

## 2018-03-19 ENCOUNTER — Telehealth: Payer: Self-pay | Admitting: Oncology

## 2018-03-19 NOTE — Telephone Encounter (Signed)
R/s appt per 11/19 sch message - pt is aware of appt date and time   

## 2018-03-21 ENCOUNTER — Encounter (HOSPITAL_COMMUNITY)
Admission: RE | Admit: 2018-03-21 | Discharge: 2018-03-21 | Disposition: A | Discharge status: Home / Self Care | Payer: Self-pay | Source: Ambulatory Visit | Attending: Interventional Cardiology | Admitting: Interventional Cardiology

## 2018-03-24 ENCOUNTER — Inpatient Hospital Stay: Payer: Medicare Other | Attending: Oncology

## 2018-03-24 ENCOUNTER — Other Ambulatory Visit: Payer: Self-pay | Admitting: Oncology

## 2018-03-24 ENCOUNTER — Encounter (HOSPITAL_COMMUNITY)
Admission: RE | Admit: 2018-03-24 | Discharge: 2018-03-24 | Disposition: A | Discharge status: Home / Self Care | Payer: Self-pay | Source: Ambulatory Visit | Attending: Interventional Cardiology | Admitting: Interventional Cardiology

## 2018-03-24 DIAGNOSIS — C50411 Malignant neoplasm of upper-outer quadrant of right female breast: Secondary | ICD-10-CM

## 2018-03-24 DIAGNOSIS — R748 Abnormal levels of other serum enzymes: Secondary | ICD-10-CM

## 2018-03-24 DIAGNOSIS — Z170 Estrogen receptor positive status [ER+]: Secondary | ICD-10-CM

## 2018-03-24 LAB — HEPATIC FUNCTION PANEL
ALBUMIN: 4.2 g/dL (ref 3.5–5.0)
ALK PHOS: 144 U/L — AB (ref 38–126)
ALT: 153 U/L — ABNORMAL HIGH (ref 0–44)
AST: 42 U/L — AB (ref 15–41)
BILIRUBIN TOTAL: 0.6 mg/dL (ref 0.3–1.2)
Bilirubin, Direct: 0.2 mg/dL (ref 0.0–0.2)
Indirect Bilirubin: 0.4 mg/dL (ref 0.3–0.9)
Total Protein: 6.9 g/dL (ref 6.5–8.1)

## 2018-03-24 NOTE — Progress Notes (Signed)
I called [name] with the results of her liver function tests, which are slightly more elevated.  We had a long discussion regarding's what this could mean and hopefully what it means is that perhaps 1 of her medications is irritating the liver or she is developing fatty liver.  However it could mean that she is developing metastases and of course that would be very worrisome.  We have set her up for her MRI next week.  I will call her with results as soon as they are available

## 2018-03-26 ENCOUNTER — Encounter (HOSPITAL_COMMUNITY): Payer: Self-pay

## 2018-03-31 ENCOUNTER — Other Ambulatory Visit: Payer: Self-pay | Admitting: Oncology

## 2018-03-31 ENCOUNTER — Encounter (HOSPITAL_COMMUNITY): Payer: Self-pay

## 2018-03-31 ENCOUNTER — Ambulatory Visit (HOSPITAL_COMMUNITY)
Admission: RE | Admit: 2018-03-31 | Discharge: 2018-03-31 | Disposition: A | Discharge status: Home / Self Care | Payer: Medicare Other | Source: Ambulatory Visit | Attending: Oncology | Admitting: Oncology

## 2018-03-31 DIAGNOSIS — C50411 Malignant neoplasm of upper-outer quadrant of right female breast: Secondary | ICD-10-CM | POA: Diagnosis not present

## 2018-03-31 DIAGNOSIS — I2510 Atherosclerotic heart disease of native coronary artery without angina pectoris: Secondary | ICD-10-CM | POA: Insufficient documentation

## 2018-03-31 DIAGNOSIS — I214 Non-ST elevation (NSTEMI) myocardial infarction: Secondary | ICD-10-CM | POA: Insufficient documentation

## 2018-03-31 DIAGNOSIS — Z170 Estrogen receptor positive status [ER+]: Secondary | ICD-10-CM | POA: Diagnosis not present

## 2018-03-31 DIAGNOSIS — Z955 Presence of coronary angioplasty implant and graft: Secondary | ICD-10-CM | POA: Insufficient documentation

## 2018-03-31 DIAGNOSIS — R748 Abnormal levels of other serum enzymes: Secondary | ICD-10-CM

## 2018-03-31 DIAGNOSIS — K449 Diaphragmatic hernia without obstruction or gangrene: Secondary | ICD-10-CM | POA: Diagnosis not present

## 2018-03-31 DIAGNOSIS — K838 Other specified diseases of biliary tract: Secondary | ICD-10-CM | POA: Diagnosis not present

## 2018-03-31 DIAGNOSIS — Z539 Procedure and treatment not carried out, unspecified reason: Secondary | ICD-10-CM | POA: Insufficient documentation

## 2018-03-31 LAB — POCT I-STAT CREATININE: CREATININE: 0.8 mg/dL (ref 0.44–1.00)

## 2018-03-31 MED ORDER — GADOBUTROL 1 MMOL/ML IV SOLN
6.0000 mL | Freq: Once | INTRAVENOUS | Status: AC | PRN
  Administered 2018-03-31: 6 mL via INTRAVENOUS

## 2018-03-31 NOTE — Progress Notes (Unsigned)
I called [name] with the results of her liver MRI which is very favorable.  She was recently started on a statin.  Possibly that is the reason for the mild elevation of her transaminases.  She will discuss that with her primary care physician.

## 2018-04-02 ENCOUNTER — Encounter (HOSPITAL_COMMUNITY)
Admission: RE | Admit: 2018-04-02 | Discharge: 2018-04-02 | Disposition: A | Discharge status: Home / Self Care | Payer: Self-pay | Source: Ambulatory Visit | Attending: Interventional Cardiology | Admitting: Interventional Cardiology

## 2018-04-04 ENCOUNTER — Encounter (HOSPITAL_COMMUNITY): Payer: Self-pay

## 2018-04-07 ENCOUNTER — Encounter (HOSPITAL_COMMUNITY): Payer: Self-pay

## 2018-04-09 ENCOUNTER — Encounter (HOSPITAL_COMMUNITY): Payer: Self-pay

## 2018-04-11 ENCOUNTER — Encounter (HOSPITAL_COMMUNITY): Payer: Self-pay

## 2018-04-14 ENCOUNTER — Encounter (HOSPITAL_COMMUNITY)
Admission: RE | Admit: 2018-04-14 | Discharge: 2018-04-14 | Disposition: A | Discharge status: Home / Self Care | Payer: Self-pay | Source: Ambulatory Visit | Attending: Interventional Cardiology | Admitting: Interventional Cardiology

## 2018-04-16 ENCOUNTER — Encounter (HOSPITAL_COMMUNITY)
Admission: RE | Admit: 2018-04-16 | Discharge: 2018-04-16 | Disposition: A | Discharge status: Home / Self Care | Payer: Self-pay | Source: Ambulatory Visit | Attending: Interventional Cardiology | Admitting: Interventional Cardiology

## 2018-04-16 NOTE — Progress Notes (Signed)
[place]  [phone]) [phone] Fax:(336) [phone]     ID: BRENA WINDSOR DOB: 03/08/1946  MR#: [id number]  ZHY#:[id number]  Patient Care Team: Seward Carol, MD as PCP - General (Internal Medicine) Kamira Mellette, Virgie Dad, MD as Consulting Physician (Oncology) Fanny Skates, MD as Consulting Physician (General Surgery) Luberta Robertson, MD (Radiology) Hennie Duos, MD as Consulting Physician (Rheumatology) Domingo Pulse, MD (Urology) Druscilla Brownie, MD as Consulting Physician (Dermatology) Luberta Mutter, MD as Consulting Physician (Ophthalmology) Magnus Sinning, MD as Consulting Physician (Physical Medicine and Rehabilitation) Benson Norway, RN as Registered Nurse (Oncology) Megan Salon, MD as Consulting Physician (Gynecology) Loney Loh, MD (Dermatology) OTHER MD:  CHIEF COMPLAINT: Estrogen receptor positive breast cancer  CURRENT TREATMENT:  Anastrozole  INTERVAL HISTORY: Sharne returns today for follow-up of her estrogen receptor positive breast cancer.   The patient continues on anastrozole, which she is tolerating well.   Her last bone density screening was on 01/30/2017 at [facility]. She is overdue for her bi-annual screening.   Since her last visit here, she underwent digital diagnostic bilateral mammography with tomography on 01/31/2018 showing Breast Density Category B. There is no evidence of breast malignancy. There are surgical and radiation changes within the right breast.  She also underwent an abdomen MRI with and without contrast on 03/31/2018 showing no evidence of metastatic disease in the abdomen. 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 There is a small hiatal hernia.   REVIEW OF SYSTEMS: Malissie is doing well overall. She is receiving cardiac rehab for health  maintenance due to her four stents. She did a two hour horseback ride in the beginning of December 2019. She experienced a lot of anxiety around her abdomen MRI, and she stated, "The thought of something getting in the way of life is terrifying." The patient denies unusual headaches, visual changes, nausea, vomiting, or dizziness. There has been no unusual cough, phlegm production, or pleurisy. This been no change in bowel or bladder habits. The patient denies unexplained fatigue or unexplained weight loss, bleeding, rash, or fever. A detailed review of systems was otherwise noncontributory.    BREAST CANCER HISTORY: From the original intake note:  Delorus had routine mammographic screening in 08/18/2015 at [facility], showing a right breast mass and possible axillary adenopathy. On 08/23/2015 she underwent bilateral diagnostic mammography with tomography and bilateral breast ultrasonography. The breast density was category B in the right breast there was a spiculated mass in the upper-outer quadrant measuring 1.2 cm and associated with pleomorphic calcifications. There were calcifications extending anteriorly and posteriorly from the mass worrisome for ductal carcinoma in situ. The total area in question is about 3 cm. There were also multiple enlarged right axillary lymph nodes. The mass was palpated as an area of fullness at the 12:00 position of the breast 3 cm from the nipple. There were palpable enlarged right axillary lymph nodes in the right axilla. Ultrasonography of the right breast confirmed an irregular hypoechoic mass in the area in question measuring 1.6 cm. There were multiple abnormal appearing enlarged right axillary lymph nodes, the largest measuring 2.3 cm.  In the left breast, mammography showed an area of architectural distortion associated with the left nipple. This was not palpable. Ultrasonography of the left breast found no correlate. This area of the left breast was biopsied  under tomography 08/24/2015. This showed (  SAA [id number]) a complex sclerosing lesion and fibroadenoma.  On 08/23/2015 the patient underwent biopsy of the right breast mass in 1 of the abnormal right axillary lymph nodes. 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 Both biopsies were HER-2 nonamplified, the breast being ratio 1.27 and number per cell 1.90, and the lymph node 1 ratio 1.24 and number per cell 3.0.  On 08/26/2015 the patient underwent bilateral breast MRI. In the left breast there was no suspicious enhancement. The left axilla was benign. In the right breast there was a mass described as in the upper inner quadrant measuring 2.3 cm. Less than a centimeter away from this mass there was a satellite nodule measuring 0.9 cm. In the right axilla there were multiple enlarged right axillary lymph nodes, including a confluent region measuring up to 4 cm. There were also enlarged level II lymph nodes present measuring up to 2.1 cm.  The patient's subsequent history is as detailed below  PAST MEDICAL HISTORY: Past Medical History:  Diagnosis Date  . Anxiety   . Arthritis 2008  . Asthma 1953   age 20  . Breast cancer ([place])   . Cancer [facility])    breast cancer  . Chronic vaginitis 1998  . Fibromyalgia 1982  . GERD (gastroesophageal reflux disease)    since at least 2011  . Hemorrhoids 2009  . History of radiation therapy 01/18/16-03/05/16   right breast/nodal areas 45 Gy, right breast boost 16 Gy  . Hypoglycemia 2003  . Hypothyroidism 1964   age 61  . Interstitial cystitis 1998  . Irritable bowel syndrome 06/14/2009   Qualifier: Diagnosis of  By: Patsy Baltimore RN, Langley Gauss    . Menopausal symptoms   . Osteoporosis 12/24/2006  . Personal history of chemotherapy   . Personal history of radiation therapy   . Pneumonia 1979   varicella  .  PONV (postoperative nausea and vomiting) 1979    PAST SURGICAL HISTORY: Past Surgical History:  Procedure Laterality Date  . BLADDER HYPEREXTENSION     X4, prior to 01/2006  . BLADDER SUSPENSION    . BREAST BIOPSY    . BREAST LUMPECTOMY Right    2017  . BREAST LUMPECTOMY WITH NEEDLE LOCALIZATION AND AXILLARY LYMPH NODE DISSECTION Right 12/05/2015   Procedure: RIGHT BREAST NEEDLE LOCALIZED (WIRES X 2) LUMPECTOMY WITH AXILLARY LYMPH NODE DISSECTION;  Surgeon: Fanny Skates, MD;  Location: [place];  Service: General;  Laterality: Right;  . BREAST LUMPECTOMY WITH RADIOACTIVE SEED LOCALIZATION Left 12/05/2015   Procedure: RADIOACTIVE SEED GUIDED LEFT BREAST LUMPECTOMY;  Surgeon: Fanny Skates, MD;  Location: [place];  Service: General;  Laterality: Left;  . BREAST SURGERY    . BUNIONECTOMY     prior to 01/2006  . CARDIAC CATHETERIZATION    . CESAREAN SECTION  1979  . CHOLECYSTECTOMY  02/08/2006   DR. INGRAM  . COLONOSCOPY    . CORONARY STENT INTERVENTION N/A 01/01/2017   Procedure: CORONARY STENT INTERVENTION;  Surgeon: Jettie Booze, MD;  Location: [place] CV [facility];  Service: Cardiovascular;  Laterality: N/A;  . DENTAL SURGERY  2009-2016   13 dental implants  . LEFT HEART CATH AND CORONARY ANGIOGRAPHY N/A 01/01/2017   Procedure: LEFT HEART CATH AND CORONARY ANGIOGRAPHY;  Surgeon: Jettie Booze, MD;  Location: [place] [facility];  Service: Cardiovascular;  Laterality: N/A;  . LEG SURGERY  07/06/2004   Broken tibia  . NASAL SINUS SURGERY     X4; prior to 01/2006  . PORT-A-CATH REMOVAL Right 12/05/2015   Procedure: REMOVAL PORT-A-CATH;  Surgeon: Fanny Skates, MD;  Location: [place];  Service: General;  Laterality: Right;  . PORTACATH PLACEMENT Left 09/08/2015   Procedure: INSERTION OF PORT-A-CATH ;  Surgeon: Fanny Skates, MD;  Location: [facility] OR;  Service: General;  Laterality: Left;    FAMILY HISTORY Family History  Problem Relation Age of Onset  . Hypertension Mother   .  Heart disease Mother        d. 42  . Parkinsonism Father        d. 79  . Heart disease Sister   . Breast cancer Sister 33       s/p mastectomy; reportedly negative BRCA1/2 testing approx 7 years ago  . Melanoma Sister        d. 36s; hx of incresed sun exposure  . Cancer Sister        dx. sarcoma of her face in her 49s; treated w/ MOHS surgery  . Parkinsonism Paternal Aunt        d. 90s  . Parkinsonism Paternal Uncle        d. late 61s  . Kidney failure Maternal Grandmother 25  . Heart attack Maternal Grandfather        d. 14s  . Heart disease Paternal Grandmother        d. 33  . Breast cancer Other 66       maternal great aunt (MGF's sister)  . Kidney cancer Paternal Uncle 49       d. 11s; unsure about smoking status  The patient's father died at age 88 from complications of Parkinson's disease. The patient's mother died age 33 with heart disease. Tierney had no brothers. She had 2 sisters. One sister was diagnosed with breast cancer at age 89. She was tested for the BRCA gene and was negative. The same sister also had melanoma diagnosed in her 70s. The patient has one maternal great aunt diagnosed with breast cancer around age 70. There is no history of ovarian cancer in the family  GYNECOLOGIC HISTORY:  Patient's last menstrual period was 04/30/1996 (approximate). Menarche age 72, first live birth age 1. The patient is GX P2. She stopped having periods in her early 22s and took hormone replacement until her breast cancer diagnosis, April 2017.  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 He has been in remission for more than 10 years. Their daughter Nira Conn lives in [place] were she is Garment/textile technologist of the [employer]. Daughter Belenda Cruise is an infectious disease M.D. working and  teaching at [employer] in [place]. The patient has 2 grandchildren. The family attends [employer]: Not in place   HEALTH MAINTENANCE: Social History   Tobacco Use  . Smoking status: Former Smoker    Packs/day: 0.25    Years: 15.00    Pack years: 3.75    Types: Cigarettes  . Smokeless tobacco: Never Used  . Tobacco comment: social smoker/"on and off"  Substance Use Topics  . Alcohol use: Yes    Alcohol/week: 0.0 standard drinks    Comment: social  . Drug use: No     Colonoscopy: 2016/ [facility]  PAP: 2015  Bone density: October 2018, T-score: -2.7  Lipid panel:  Allergies  Allergen Reactions  . Chlorhexidine Other (See Comments)    [place] IS OK. The CLOTH WIPE causes reaction. Causes itching all over the area  . Other Rash    Adhesive tape    Current Outpatient Medications  Medication Sig Dispense Refill  . albuterol (PROVENTIL HFA;VENTOLIN HFA) 108 (90 Base) MCG/ACT inhaler Inhale 1-2 puffs into the lungs every 6 (six) hours as needed for wheezing or shortness of breath. Started in 1998    . anastrozole (ARIMIDEX) 1 MG tablet Take 1 tablet (1 mg total) by mouth daily. 90 tablet 4  . aspirin EC 81 MG EC tablet Take 1 tablet (81 mg total) by mouth daily. 30 tablet 0  . BRILINTA 90 MG TABS tablet TAKE 1 TABLET(90 MG) BY MOUTH TWICE DAILY 60 tablet 9  . buPROPion (WELLBUTRIN XL) 300 MG 24 hr tablet Take 300 mg by mouth daily.     . Calcium Carbonate-Vitamin D (CALCIUM + D PO) Take 1 tablet by mouth at bedtime. Started approximately 1998    . carisoprodol (SOMA) 350 MG tablet Take 350 mg by mouth 2 (two) times daily as needed for muscle spasms. Started in 2008.  2  . diazepam (VALIUM) 5 MG tablet Take 5 mg by mouth every 6 (six) hours as needed for muscle spasms (interstitial cystitis). Started approximately 2008.    . furosemide (LASIX) 20 MG tablet Take 1 tablet (20 mg total) by mouth daily as needed (shortness of breath). 30 tablet 3  .  HYDROcodone-acetaminophen (NORCO) 10-325 MG tablet Take 1 tablet by mouth every 4 (four) hours as needed for moderate pain or severe pain (as needed for fibromyalgia and IC).     . hydrOXYzine (ATARAX/VISTARIL) 25 MG tablet Take 1 tablet by mouth at bedtime.    Marland Kitchen levothyroxine (SYNTHROID, LEVOTHROID) 75 MCG tablet Take 75 mcg by mouth daily.  2  . LORazepam (ATIVAN) 2 MG tablet Take 1 tablet by mouth daily as needed for anxiety.   5  . metoprolol succinate (TOPROL-XL) 25 MG 24 hr tablet TAKE 1/2 TABLET(12.5 MG) BY MOUTH DAILY 45 tablet 3  . minoxidil (ROGAINE) 2 % external solution Apply topically 2 (two) times daily.    . nitroGLYCERIN (NITROSTAT) 0.4 MG SL tablet PLACE 1 TABLET UNDER TONGUE EVERY FIVE MINUTES AS NEEDED FOR CHEST PAIN 25 tablet 3  . NONFORMULARY OR COMPOUNDED ITEM Vit E vaginal suppositories 277m/ml.  One pv three times weekly.  #36/4RF 36 each 4  . ondansetron (ZOFRAN) 4 MG tablet Take 1 tablet by mouth daily as needed for nausea or vomiting.   3  . pantoprazole (PROTONIX) 40 MG tablet Take 40 mg by mouth 2 (two) times daily. Started in 2012.    . potassium chloride (K-DUR) 10 MEQ tablet TAKE 1 TABLET BY MOUTH AS NEEDED. TAKE WITH LASIX 30 tablet 11  . prochlorperazine (COMPAZINE) 10 MG tablet TAKE 1 TABLET(10 MG) BY MOUTH EVERY 6 HOURS AS NEEDED FOR NAUSEA OR VOMITING 385 tablet 1  . rosuvastatin (CRESTOR) 20 MG tablet Take 1 tablet (20 mg total) by mouth daily. 90 tablet 3  . terconazole (TERAZOL 7) 0.4 % vaginal cream Place 1 applicator vaginally 2 (two) times daily as needed (irritation).     . Vaginal Lubricant (REPLENS VA) Place vaginally daily.    . Vitamin D, Ergocalciferol, (DRISDOL) 50000 units CAPS capsule TAKE 1 CAPSULE BY MOUTH EVERY 7 DAYS 12 capsule 4  .  zolpidem (AMBIEN) 10 MG tablet Take 10 mg by mouth at bedtime as needed for sleep.      No current facility-administered medications for this visit.      OBJECTIVE: Middle-aged white woman in no acute  distress  Vitals:   04/17/18 1154  BP: 134/74  Pulse: (!) 59  Resp: 18  Temp: 98.4 F (36.9 C)  SpO2: 98%     Body mass index is 26.43 kg/m.    ECOG FS:1 - Symptomatic but completely ambulatory  Sclerae unicteric, EOMs intact Oropharynx clear and moist No cervical or supraclavicular adenopathy Lungs no rales or rhonchi Heart regular rate and rhythm Abd soft, nontender, positive bowel sounds MSK no focal spinal tenderness, no upper extremity lymphedema Neuro: nonfocal, well oriented, appropriate affect Breasts: The right breast is status post lumpectomy and radiation.  There is still very minimal scattered erythema in the medial superior aspect, but overall the breast is much softer than before and there is no evidence of disease recurrence.  The left breast is benign, both axillae are benign.  Right breast 08/15/2016     Right Breast 02/03/2018      LAB RESULTS:  CMP     Component Value Date/Time   NA 142 04/17/2018 1141   NA 138 03/11/2017 1524   K 4.0 04/17/2018 1141   K 3.8 03/11/2017 1524   CL 105 04/17/2018 1141   CO2 28 04/17/2018 1141   CO2 25 03/11/2017 1524   GLUCOSE 99 04/17/2018 1141   GLUCOSE 136 03/11/2017 1524   BUN 13 04/17/2018 1141   BUN 13.2 03/11/2017 1524   CREATININE 0.83 04/17/2018 1141   CREATININE 0.8 03/11/2017 1524   CALCIUM 9.2 04/17/2018 1141   CALCIUM 9.6 03/11/2017 1524   PROT 6.8 04/17/2018 1141   PROT 7.6 03/11/2017 1524   ALBUMIN 3.8 04/17/2018 1141   ALBUMIN 3.8 03/11/2017 1524   AST 22 04/17/2018 1141   AST 24 03/11/2017 1524   ALT 21 04/17/2018 1141   ALT 38 03/11/2017 1524   ALKPHOS 96 04/17/2018 1141   ALKPHOS 166 (H) 03/11/2017 1524   BILITOT 0.6 04/17/2018 1141   BILITOT 0.44 03/11/2017 1524   GFRNONAA >60 04/17/2018 1141   GFRAA >60 04/17/2018 1141    INo results found for: SPEP, UPEP  Lab Results  Component Value Date   WBC 8.4 04/17/2018   NEUTROABS 5.6 04/17/2018   HGB 13.1 04/17/2018   HCT 39.3  04/17/2018   MCV 96.3 04/17/2018   PLT 217 04/17/2018      Chemistry      Component Value Date/Time   NA 142 04/17/2018 1141   NA 138 03/11/2017 1524   K 4.0 04/17/2018 1141   K 3.8 03/11/2017 1524   CL 105 04/17/2018 1141   CO2 28 04/17/2018 1141   CO2 25 03/11/2017 1524   BUN 13 04/17/2018 1141   BUN 13.2 03/11/2017 1524   CREATININE 0.83 04/17/2018 1141   CREATININE 0.8 03/11/2017 1524      Component Value Date/Time   CALCIUM 9.2 04/17/2018 1141   CALCIUM 9.6 03/11/2017 1524   ALKPHOS 96 04/17/2018 1141   ALKPHOS 166 (H) 03/11/2017 1524   AST 22 04/17/2018 1141   AST 24 03/11/2017 1524   ALT 21 04/17/2018 1141   ALT 38 03/11/2017 1524   BILITOT 0.6 04/17/2018 1141   BILITOT 0.44 03/11/2017 1524       No results found for: LABCA2  No components found for: [id number]  No results  for input(s): INR in the last 168 hours.  Urinalysis    Component Value Date/Time   COLORURINE YELLOW 01/03/2017 1205   APPEARANCEUR HAZY (A) 01/03/2017 1205   LABSPEC 1.006 01/03/2017 1205   PHURINE 7.0 01/03/2017 1205   GLUCOSEU NEGATIVE 01/03/2017 1205   HGBUR SMALL (A) 01/03/2017 1205   BILIRUBINUR NEGATIVE 01/03/2017 1205   BILIRUBINUR n 10/01/2016 1316   KETONESUR NEGATIVE 01/03/2017 1205   PROTEINUR NEGATIVE 01/03/2017 1205   UROBILINOGEN 0.2 10/01/2016 1316   NITRITE NEGATIVE 01/03/2017 1205   LEUKOCYTESUR LARGE (A) 01/03/2017 1205      ELIGIBLE FOR AVAILABLE RESEARCH PROTOCOL: PALLAS, neuropathy study  STUDIES: Mr Liver W Wo Contrast  Result Date: 03/31/2018 CLINICAL DATA:  Breast cancer status post lumpectomy in 2017. Rising liver function tests. EXAM: MRI ABDOMEN WITHOUT AND WITH CONTRAST TECHNIQUE: Multiplanar multisequence MR imaging of the abdomen was performed both before and after the administration of intravenous contrast. CONTRAST:  6 cc Gadavist IV. COMPARISON:  02/08/2006 abdominal sonogram.  01/24/2017 chest CT. FINDINGS: Lower chest: Stable mild scarring  at both lung bases. Hepatobiliary: Normal liver size and configuration. No hepatic steatosis. No liver mass. Cholecystectomy. Mild central intrahepatic biliary ductal dilatation, unchanged from prior chest CT studies. Dilated common bile duct, which is 16 mm diameter proximally, with smooth distal tapering. No evidence of biliary strictures or filling defects. Pancreas: No pancreatic mass or duct dilation.  No pancreas divisum. Spleen: Normal size. No mass. Adrenals/Urinary Tract: Normal adrenals. No hydronephrosis. Normal kidneys with no renal mass. Stomach/Bowel: Small hiatal hernia. Otherwise normal nondistended stomach. Visualized small and large bowel is normal caliber, with no bowel wall thickening. Vascular/Lymphatic: Normal caliber abdominal aorta. Patent portal, splenic, hepatic and renal veins. No pathologically enlarged lymph nodes in the abdomen. Other: No abdominal ascites or focal fluid collection. Musculoskeletal: No aggressive appearing focal osseous lesions. IMPRESSION: 1. No evidence of metastatic disease in the abdomen. 2. 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 3. Small hiatal hernia. Electronically Signed   By: Ilona Sorrel M.D.   On: 03/31/2018 08:23      ASSESSMENT: 72 y.o. [place] woman status post right breast upper outer quadrant and right axillary lymph node biopsy 08/23/2015, both positive for a clinical T1 N2, stage IIIA invasive ductal carcinoma, grade 2, strongly estrogen receptor positive, 5% progesterone receptor positive, with an MIB-120-30% and no HER-2 amplification  (a) biopsy of a 0.9 cm Right breast satellite nodule 09/13/2015 showed invasive ductal carcinoma, grade 2, E-cadherin strongly positive, with a prognostic panel identical to the larger right upper quadrant mass  (1) left breast biopsy 08/24/2015 shows a complex sclerosing  lesion, excised 12/05/2015  (2) neoadjuvant chemotherapy consisting of cyclophosphamide and docetaxel every 21 days 4, with OnPro support, started 09/12/2015, completed 11/14/2015  (3) status post right lumpectomy and sentinel lymph node dissection 12/05/2015 for a residual pT2 pN3, stage IIIC invasive ductal carcinoma, grade 2, with a close anterior margin (skin)  (4) adjuvant radiation 01/18/16 - 11/6/17with capecitabine chemo-sensitization   1) Right breast/nodal areas: 45 Gy in 25 fractions                         2) Right breast boost: 16 Gy in 8 fractions  (5) capecitabine started 04/13/2016, discontinued January 2018 with mild/moderate side effects developing  (6) anastrozole started 06/01/2016  (a) bone density January 30, 2017 shows  a T score of -2.7 (osteoporosis)  (7) genetics testing ([place]kenazi descent) 02/07/2016 through the [place] offered by [facility] Hope Pigeon, MD) i found no deleterious mutations in  ATM, BARD1, BRCA1, BRCA2, BRIP1, CDH1, CHEK2, FANCC, MLH1, MSH2, MSH6, NBN, PALB2, PMS2, PTEN, RAD51C, RAD51D, TP53, and XRCC2.  This panel also includes deletion/duplication analysis (without sequencing) for one gene, EPCAM.  (8) consented to PALLAS trial   (a) randomized to anastrozole alone  PLAN: Diantha is now a little over 2 years out from definitive surgery for her breast cancer with no evidence of disease recurrence.  This is very favorable.  She is tolerating anastrozole remarkably well and the plan will be to continue that most likely to a total of 7 years.  She will be due to for repeat bone density in October 2020  We reviewed her MRI of the liver which is very favorable.  We also looked at her mammogram which shows her breasts are not dense, also favorable.  She will see me again in April.  This will be the end of the formal monitoring for the study.  Before that visit she will have a repeat CT scan of the chest as one last look at  the multiple very small lung nodules that so far have not shown any evidence of change  She knows to call for any other issues that may develop before her next visit.     Bobbye Reinitz, Virgie Dad, MD  04/17/18 12:30 PM Medical Oncology and Hematology [facility] [address] [place], [place] [address] Tel. [phone]    Fax. [phone]   I, Jacqualyn Posey am acting as a Education administrator for Chauncey Cruel, MD.   I, Lurline Del MD, have reviewed the above documentation for accuracy and completeness, and I agree with the above.

## 2018-04-17 ENCOUNTER — Inpatient Hospital Stay: Payer: Medicare Other | Admitting: *Deleted

## 2018-04-17 ENCOUNTER — Telehealth: Payer: Self-pay | Admitting: Oncology

## 2018-04-17 ENCOUNTER — Inpatient Hospital Stay: Payer: Medicare Other | Attending: Oncology

## 2018-04-17 ENCOUNTER — Inpatient Hospital Stay (HOSPITAL_BASED_OUTPATIENT_CLINIC_OR_DEPARTMENT_OTHER): Payer: Medicare Other | Admitting: Oncology

## 2018-04-17 VITALS — BP 134/74 | HR 59 | Temp 98.4°F | Resp 18 | Ht 62.0 in | Wt 144.5 lb

## 2018-04-17 DIAGNOSIS — Z79811 Long term (current) use of aromatase inhibitors: Secondary | ICD-10-CM

## 2018-04-17 DIAGNOSIS — C50411 Malignant neoplasm of upper-outer quadrant of right female breast: Secondary | ICD-10-CM

## 2018-04-17 DIAGNOSIS — Z170 Estrogen receptor positive status [ER+]: Principal | ICD-10-CM

## 2018-04-17 DIAGNOSIS — M810 Age-related osteoporosis without current pathological fracture: Secondary | ICD-10-CM

## 2018-04-17 DIAGNOSIS — C773 Secondary and unspecified malignant neoplasm of axilla and upper limb lymph nodes: Secondary | ICD-10-CM | POA: Diagnosis not present

## 2018-04-17 DIAGNOSIS — Z006 Encounter for examination for normal comparison and control in clinical research program: Secondary | ICD-10-CM | POA: Insufficient documentation

## 2018-04-17 DIAGNOSIS — Z923 Personal history of irradiation: Secondary | ICD-10-CM | POA: Insufficient documentation

## 2018-04-17 DIAGNOSIS — Z9221 Personal history of antineoplastic chemotherapy: Secondary | ICD-10-CM | POA: Diagnosis not present

## 2018-04-17 LAB — CBC WITH DIFFERENTIAL (CANCER CENTER ONLY)
ABS IMMATURE GRANULOCYTES: 0.02 10*3/uL (ref 0.00–0.07)
BASOS ABS: 0 10*3/uL (ref 0.0–0.1)
BASOS PCT: 1 %
Eosinophils Absolute: 0.2 10*3/uL (ref 0.0–0.5)
Eosinophils Relative: 2 %
HCT: 39.3 % (ref 36.0–46.0)
HEMOGLOBIN: 13.1 g/dL (ref 12.0–15.0)
IMMATURE GRANULOCYTES: 0 %
LYMPHS PCT: 23 %
Lymphs Abs: 1.9 10*3/uL (ref 0.7–4.0)
MCH: 32.1 pg (ref 26.0–34.0)
MCHC: 33.3 g/dL (ref 30.0–36.0)
MCV: 96.3 fL (ref 80.0–100.0)
MONO ABS: 0.7 10*3/uL (ref 0.1–1.0)
Monocytes Relative: 8 %
NEUTROS ABS: 5.6 10*3/uL (ref 1.7–7.7)
NEUTROS PCT: 66 %
NRBC: 0 % (ref 0.0–0.2)
PLATELETS: 217 10*3/uL (ref 150–400)
RBC: 4.08 MIL/uL (ref 3.87–5.11)
RDW: 13.5 % (ref 11.5–15.5)
WBC: 8.4 10*3/uL (ref 4.0–10.5)

## 2018-04-17 LAB — CMP (CANCER CENTER ONLY)
ALT: 21 U/L (ref 0–44)
ANION GAP: 9 (ref 5–15)
AST: 22 U/L (ref 15–41)
Albumin: 3.8 g/dL (ref 3.5–5.0)
Alkaline Phosphatase: 96 U/L (ref 38–126)
BILIRUBIN TOTAL: 0.6 mg/dL (ref 0.3–1.2)
BUN: 13 mg/dL (ref 8–23)
CHLORIDE: 105 mmol/L (ref 98–111)
CO2: 28 mmol/L (ref 22–32)
Calcium: 9.2 mg/dL (ref 8.9–10.3)
Creatinine: 0.83 mg/dL (ref 0.44–1.00)
GFR, Est AFR Am: >60 mL/min (ref >60)
Glucose, Bld: 99 mg/dL (ref 70–99)
POTASSIUM: 4 mmol/L (ref 3.5–5.1)
Sodium: 142 mmol/L (ref 135–145)
TOTAL PROTEIN: 6.8 g/dL (ref 6.5–8.1)

## 2018-04-17 NOTE — Telephone Encounter (Signed)
Gave avs and calendar ° °

## 2018-04-17 NOTE — Research (Signed)
[name] - Cycle 24 Patient in to clinic unaccompanied today for Cycle 24 visit, conducted 6 days early, as allowed per protocol, due to upcoming holiday and [place] closure. Upon arrival to clinic, patient was given her questionnaires for PROs and adherence, which were completed by her independently. Patient did not have her completed drug diaries for Cycles 21 and 22 for anti-hormone therapy,and believes she may have left them in the car. Patientwas given two postage-paid, pre-addressed envelopes to mail her diaries in, if she can locate them. She will also mail in the current Cycle 23 diary following its completion next week. Patient was  given drug diaries to complete for Cycles 24-26, understanding that this will complete her diary recordings for the study, although she will continue to take anti-hormone medication as prescribed, for a total of five years. She reports no changes in ongoing previously reported AEs. Based on lab results review and H&P by Dr. Jana Hakim, patient condition is acceptable for continued treatment on study. Plans made for return visit in April 2020, for the End of Treatment Phase visit, occurring approximately one month after the completion of study treatment. At that time, patient and MD are aware that subsequent clinic visits will occur annually, with telephone contact to the patient by the research nurse at the six month interval between visits.Gwynn Burly. Brigitte Pulse BSN, RN, CCRP 04/17/2018 1:46 PM

## 2018-04-18 ENCOUNTER — Encounter (HOSPITAL_COMMUNITY): Payer: Self-pay

## 2018-04-21 ENCOUNTER — Encounter (HOSPITAL_COMMUNITY): Payer: Self-pay

## 2018-04-24 ENCOUNTER — Other Ambulatory Visit: Payer: Self-pay | Admitting: Oncology

## 2018-04-25 ENCOUNTER — Encounter (HOSPITAL_COMMUNITY): Payer: Self-pay

## 2018-04-28 ENCOUNTER — Encounter (HOSPITAL_COMMUNITY)
Admission: RE | Admit: 2018-04-28 | Discharge: 2018-04-28 | Disposition: A | Discharge status: Home / Self Care | Payer: Self-pay | Source: Ambulatory Visit | Attending: Interventional Cardiology | Admitting: Interventional Cardiology

## 2018-04-28 ENCOUNTER — Other Ambulatory Visit: Payer: Self-pay | Admitting: Interventional Cardiology

## 2018-05-02 ENCOUNTER — Encounter (HOSPITAL_COMMUNITY): Payer: Self-pay

## 2018-05-02 DIAGNOSIS — Z539 Procedure and treatment not carried out, unspecified reason: Secondary | ICD-10-CM | POA: Insufficient documentation

## 2018-05-02 DIAGNOSIS — I214 Non-ST elevation (NSTEMI) myocardial infarction: Secondary | ICD-10-CM | POA: Insufficient documentation

## 2018-05-02 DIAGNOSIS — I2510 Atherosclerotic heart disease of native coronary artery without angina pectoris: Secondary | ICD-10-CM | POA: Insufficient documentation

## 2018-05-02 DIAGNOSIS — Z955 Presence of coronary angioplasty implant and graft: Secondary | ICD-10-CM | POA: Insufficient documentation

## 2018-05-05 ENCOUNTER — Encounter (HOSPITAL_COMMUNITY): Payer: Self-pay

## 2018-05-07 ENCOUNTER — Encounter (HOSPITAL_COMMUNITY): Payer: Self-pay

## 2018-05-09 ENCOUNTER — Encounter (HOSPITAL_COMMUNITY): Payer: Self-pay

## 2018-05-12 ENCOUNTER — Encounter (HOSPITAL_COMMUNITY): Payer: Self-pay

## 2018-05-14 ENCOUNTER — Encounter (HOSPITAL_COMMUNITY): Payer: Self-pay

## 2018-05-16 ENCOUNTER — Encounter (HOSPITAL_COMMUNITY): Payer: Self-pay

## 2018-05-19 ENCOUNTER — Encounter (HOSPITAL_COMMUNITY): Payer: Self-pay

## 2018-05-21 ENCOUNTER — Encounter (HOSPITAL_COMMUNITY)
Admission: RE | Admit: 2018-05-21 | Discharge: 2018-05-21 | Disposition: A | Discharge status: Home / Self Care | Payer: Self-pay | Source: Ambulatory Visit | Attending: Interventional Cardiology | Admitting: Interventional Cardiology

## 2018-05-23 ENCOUNTER — Encounter (HOSPITAL_COMMUNITY): Payer: Self-pay

## 2018-05-26 ENCOUNTER — Encounter (HOSPITAL_COMMUNITY): Payer: Self-pay

## 2018-05-28 ENCOUNTER — Encounter (HOSPITAL_COMMUNITY): Payer: Self-pay

## 2018-05-28 NOTE — Research (Signed)
Adverse Event Log Protocol: AFT-05 PALLAS Cycles 21-23: 02/03/2018-04/17/2018 Baseline AEs - improved to grade 1: neuropathy (10/12/16) and fatigue (08/15/16) Event Grade Onset Date Resolved Date Attribution to anastrozole Treatment/ Comments  Vaginal dryness Grade 1 08/01/16 ongoing Yes   Urinary incontinence- stress  Grade 1  08/15/16  ongoing  No   Atrophic vaginitis Grade 2 09/13/16 ongoing Yes   Sinus bradycardia, int. Grade 1 12/31/16 ongoing No   LV diastolic dysfunction Grade 1 01/02/17 ongoing No   Hyperlipidemia Grade 2 01/02/17 ongoing Yes   Chronic diastolic CHF Grade 2 0/37/54 ongoing No   Weight loss Grade 2 03/11/17 ongoing No 12/19 grade 1  Bruising Grade 1 04/01/17 ongoing No   Hypertension Grade 1 11/04/17 ongoing No   Leg cramps Grade 1 01/12/18 ongoing No periodically  Restless leg syndrome Grade 1 01/12/18 ongoing No   AST increased Grade 1 01/24/18 04/17/18 No   ALT increased Grade 1 01/24/18 03/24/18 No Worsened to grade 2  ALT increased Grade 2 03/24/18 04/17/18 No   Alkaline phosphatase increased Grade 1 03/24/18 04/17/18 No   Fever Grade 1 03/17/18 02/2018 No   Anxiety Grade 2 03/24/18 03/31/18 No Related to MRI  Cindy S. Brigitte Pulse BSN, RN, CCRP 05/28/2018 1:29 PM

## 2018-05-30 ENCOUNTER — Encounter (HOSPITAL_COMMUNITY): Payer: Self-pay

## 2018-06-02 ENCOUNTER — Encounter (HOSPITAL_COMMUNITY): Payer: Self-pay

## 2018-06-02 DIAGNOSIS — I2510 Atherosclerotic heart disease of native coronary artery without angina pectoris: Secondary | ICD-10-CM | POA: Insufficient documentation

## 2018-06-02 DIAGNOSIS — Z539 Procedure and treatment not carried out, unspecified reason: Secondary | ICD-10-CM | POA: Insufficient documentation

## 2018-06-02 DIAGNOSIS — Z955 Presence of coronary angioplasty implant and graft: Secondary | ICD-10-CM | POA: Insufficient documentation

## 2018-06-02 DIAGNOSIS — I214 Non-ST elevation (NSTEMI) myocardial infarction: Secondary | ICD-10-CM | POA: Insufficient documentation

## 2018-06-04 ENCOUNTER — Encounter (HOSPITAL_COMMUNITY): Payer: Self-pay

## 2018-06-06 ENCOUNTER — Encounter (HOSPITAL_COMMUNITY): Payer: Self-pay

## 2018-06-09 ENCOUNTER — Encounter (HOSPITAL_COMMUNITY): Payer: Self-pay

## 2018-06-11 ENCOUNTER — Encounter (HOSPITAL_COMMUNITY)
Admission: RE | Admit: 2018-06-11 | Discharge: 2018-06-11 | Disposition: A | Discharge status: Home / Self Care | Payer: Self-pay | Source: Ambulatory Visit | Attending: Interventional Cardiology | Admitting: Interventional Cardiology

## 2018-06-13 ENCOUNTER — Encounter (HOSPITAL_COMMUNITY)
Admission: RE | Admit: 2018-06-13 | Discharge: 2018-06-13 | Disposition: A | Discharge status: Home / Self Care | Payer: Self-pay | Source: Ambulatory Visit | Attending: Interventional Cardiology | Admitting: Interventional Cardiology

## 2018-06-16 ENCOUNTER — Encounter (HOSPITAL_COMMUNITY): Payer: Self-pay

## 2018-06-18 ENCOUNTER — Encounter (HOSPITAL_COMMUNITY): Payer: Self-pay

## 2018-06-20 ENCOUNTER — Encounter (HOSPITAL_COMMUNITY): Payer: Self-pay

## 2018-06-23 ENCOUNTER — Encounter (HOSPITAL_COMMUNITY)
Admission: RE | Admit: 2018-06-23 | Discharge: 2018-06-23 | Disposition: A | Discharge status: Home / Self Care | Payer: Self-pay | Source: Ambulatory Visit | Attending: Interventional Cardiology | Admitting: Interventional Cardiology

## 2018-06-25 ENCOUNTER — Encounter (HOSPITAL_COMMUNITY)
Admission: RE | Admit: 2018-06-25 | Discharge: 2018-06-25 | Disposition: A | Discharge status: Home / Self Care | Payer: Self-pay | Source: Ambulatory Visit | Attending: Interventional Cardiology | Admitting: Interventional Cardiology

## 2018-06-27 ENCOUNTER — Encounter (HOSPITAL_COMMUNITY): Payer: Self-pay

## 2018-06-30 ENCOUNTER — Encounter (HOSPITAL_COMMUNITY): Payer: Self-pay | Attending: Interventional Cardiology

## 2018-06-30 DIAGNOSIS — Z539 Procedure and treatment not carried out, unspecified reason: Secondary | ICD-10-CM | POA: Insufficient documentation

## 2018-06-30 DIAGNOSIS — I214 Non-ST elevation (NSTEMI) myocardial infarction: Secondary | ICD-10-CM | POA: Insufficient documentation

## 2018-06-30 DIAGNOSIS — Z955 Presence of coronary angioplasty implant and graft: Secondary | ICD-10-CM | POA: Insufficient documentation

## 2018-06-30 DIAGNOSIS — I2510 Atherosclerotic heart disease of native coronary artery without angina pectoris: Secondary | ICD-10-CM | POA: Insufficient documentation

## 2018-07-02 ENCOUNTER — Encounter (HOSPITAL_COMMUNITY): Payer: Self-pay

## 2018-07-04 ENCOUNTER — Encounter (HOSPITAL_COMMUNITY): Payer: Self-pay

## 2018-07-07 ENCOUNTER — Encounter (HOSPITAL_COMMUNITY): Payer: Self-pay

## 2018-07-07 ENCOUNTER — Telehealth (HOSPITAL_COMMUNITY): Payer: Self-pay | Admitting: Internal Medicine

## 2018-07-09 ENCOUNTER — Encounter (HOSPITAL_COMMUNITY): Payer: Self-pay

## 2018-07-11 ENCOUNTER — Encounter (HOSPITAL_COMMUNITY): Payer: Self-pay

## 2018-07-14 ENCOUNTER — Encounter (HOSPITAL_COMMUNITY): Payer: Self-pay

## 2018-07-14 ENCOUNTER — Telehealth (HOSPITAL_COMMUNITY): Payer: Self-pay

## 2018-07-14 NOTE — Telephone Encounter (Signed)
Phone call to patient communicated that cardiac rehab will be closing for 2 weeks d/t the corona virus. Pt verbalized understanding.  

## 2018-07-16 ENCOUNTER — Encounter (HOSPITAL_COMMUNITY): Payer: Self-pay

## 2018-07-18 ENCOUNTER — Encounter (HOSPITAL_COMMUNITY): Payer: Self-pay

## 2018-07-21 ENCOUNTER — Encounter (HOSPITAL_COMMUNITY): Payer: Self-pay

## 2018-07-21 ENCOUNTER — Other Ambulatory Visit: Payer: Self-pay | Admitting: Cardiology

## 2018-07-22 ENCOUNTER — Telehealth (HOSPITAL_COMMUNITY): Payer: Self-pay | Admitting: Internal Medicine

## 2018-07-23 ENCOUNTER — Encounter (HOSPITAL_COMMUNITY): Payer: Self-pay

## 2018-07-25 ENCOUNTER — Encounter (HOSPITAL_COMMUNITY): Payer: Self-pay

## 2018-07-28 ENCOUNTER — Encounter (HOSPITAL_COMMUNITY): Payer: Self-pay

## 2018-07-30 ENCOUNTER — Encounter (HOSPITAL_COMMUNITY): Payer: Self-pay

## 2018-08-01 ENCOUNTER — Encounter (HOSPITAL_COMMUNITY): Payer: Self-pay

## 2018-08-04 ENCOUNTER — Encounter (HOSPITAL_COMMUNITY): Payer: Self-pay

## 2018-08-06 ENCOUNTER — Encounter (HOSPITAL_COMMUNITY): Payer: Self-pay

## 2018-08-08 ENCOUNTER — Encounter (HOSPITAL_COMMUNITY): Payer: Self-pay

## 2018-08-08 ENCOUNTER — Telehealth (HOSPITAL_COMMUNITY): Payer: Self-pay | Admitting: *Deleted

## 2018-08-08 NOTE — Telephone Encounter (Signed)
Called to notify patient that the cardiac and pulmonary rehabilitation department remains closed at this time due to COVID-19 restrictions. Pt verbalized understanding.  Sol Passer, MS, ACSM CEP 08/08/2018 1021

## 2018-08-11 ENCOUNTER — Encounter (HOSPITAL_COMMUNITY): Payer: Self-pay

## 2018-08-13 ENCOUNTER — Telehealth: Payer: Self-pay | Admitting: Oncology

## 2018-08-13 ENCOUNTER — Telehealth: Payer: Self-pay | Admitting: *Deleted

## 2018-08-13 ENCOUNTER — Encounter (HOSPITAL_COMMUNITY): Payer: Self-pay

## 2018-08-13 NOTE — Telephone Encounter (Signed)
Called patient regarding upcoming Webex appointment, patient is notified and e-mail will be sent. Patient also wanted lab appointment moved to the same day as CT scan in June.  Message to provider.

## 2018-08-13 NOTE — Telephone Encounter (Signed)
08/13/2018 Received In [employer] from scheduler Nicholaus Corolla on 08/12/2018 indicating that patient had called the [facility] about rescheduling her appointment for next week. Called patient today to discuss her 08/21/2018 appointment, scheduled as the End of Treatment Phase Visit (EOTPV) for the AFT-05 PALLAS study. Explained to patient that due to the COVID-19 pandemic, allowances were being made to conduct some study visits remotely. This can be done via WebEx or telephone call between the provider (investigator Dr. Jana Hakim) and the patient, with the research nurse in attendance via WebEx if possible. Noted that certain study requirements for lab testing and vital signs must be done on site, and will be re-attempted in the month of May to meet compliance with the study. Patient is in agreement with this plan, understanding that future visits will be reevaluated and altered if needed, depending on the state of the pandemic. Scheduler contacted regarding making this a virtual visit. Cindy S. Brigitte Pulse BSN, RN, CCRP 08/13/2018 1:39 PM

## 2018-08-15 ENCOUNTER — Encounter (HOSPITAL_COMMUNITY): Payer: Self-pay

## 2018-08-18 ENCOUNTER — Ambulatory Visit (HOSPITAL_COMMUNITY): Admission: RE | Admit: 2018-08-18 | Payer: Medicare Other | Source: Ambulatory Visit

## 2018-08-18 ENCOUNTER — Other Ambulatory Visit: Payer: Medicare Other

## 2018-08-18 ENCOUNTER — Other Ambulatory Visit: Payer: Self-pay | Admitting: Oncology

## 2018-08-18 ENCOUNTER — Encounter (HOSPITAL_COMMUNITY): Payer: Self-pay

## 2018-08-20 ENCOUNTER — Encounter (HOSPITAL_COMMUNITY): Payer: Self-pay

## 2018-08-20 NOTE — Progress Notes (Signed)
[name]  [phone] [phone]) [phone]     ID: [name] DOB: March 18, 1946  MR#: [id number]  UUV#:[id number]  Patient Care Team: Seward Carol, MD as PCP - General (Internal Medicine) Tyshay Adee, Virgie Dad, MD as Consulting Physician (Oncology) Fanny Skates, MD as Consulting Physician (General Surgery) Luberta Robertson, MD (Radiology) Hennie Duos, MD as Consulting Physician (Rheumatology) Domingo Pulse, MD (Urology) Druscilla Brownie, MD as Consulting Physician (Dermatology) Luberta Mutter, MD as Consulting Physician (Ophthalmology) Magnus Sinning, MD as Consulting Physician (Physical Medicine and Rehabilitation) Benson Norway, RN as Registered Nurse (Oncology) Megan Salon, MD as Consulting Physician (Gynecology) Loney Loh, MD (Dermatology) OTHER MD:   CHIEF COMPLAINT: Estrogen receptor positive breast cancer  CURRENT TREATMENT:  Anastrozole   I connected with Asencion Islam on 08/21/18 at  3:30 PM EDT by video enabled telemedicine visit and verified that I am speaking with the correct person using two identifiers.   I discussed the limitations, risks, security and privacy concerns of performing an evaluation and management service by telemedicine and the availability of in-person appointments. I also discussed with the patient that there may be a patient responsible charge related to this service. The patient expressed understanding and agreed to proceed.   Other persons participating in the visit and their role in the encounter:   - Production designer, theatre/television/film, [place], Research Nurse  Patients location: home  Providers location: [facility]   Chief Complaint: Estrogen receptor positive breast cancer    INTERVAL HISTORY: [name] was seen today for follow-up and treatment of her estrogen receptor positive breast cancer.   She continues on the PALLAS trial, observation arm, and we are asked to  document the interval history, disease status, performance status, and any adverse events related to the aromatase inhibitor.  She continues on anastrozole. She continues to have vaginal dryness and hot flashes, but she says that its nothing that she can't deal with. She does currently have a yeast infection, which she believes is from stress.   Kalila's last bone density screening on 01/30/2017, showed a T-score of -2.7, which is considered osteoporotic.    Since her last visit here, she has not undergone any additional studies.    She is scheduled for a chest CT on 09/29/2018, but this will be pushed to 01/2019.     REVIEW OF SYSTEMS: Jerome notes that she has had some difficulties with her husband keeping with the social distancing policies. She eventually had to sit him down and have a heart to heart about his habits. Zafira has ordered groceries to pick up curb side. She is worried about her daughter who is working actively with some COVID-19 patients. She notes that her husband had a horrible cough with fever back in January 2020.   She wears her FitBit every day, and she is logging 1 to 2 miles per day at minimum. She is also utilizing her treadmill and exercise bike at home as well. She notes that she has had to utilize her inhaler more than usual. She continues to eat a healthy diet. She notes that her weight is stable.   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 Surgery was recommended, but Deniyah would opt to not to do the elective surgery.   The patient denies unusual headaches, visual changes, nausea, vomiting, or dizziness. There has been no unusual cough, phlegm production, or pleurisy.  This been no change in bowel or bladder habits. The patient denies unexplained fatigue or unexplained weight loss, bleeding, rash, or fever. A detailed review of systems was otherwise noncontributory.    BREAST CANCER HISTORY: From the  original intake note:  Gisselle had routine mammographic screening in 08/18/2015 at the [facility], showing a right breast mass and possible axillary adenopathy. On 08/23/2015 she underwent bilateral diagnostic mammography with tomography and bilateral breast ultrasonography. The breast density was category B in the right breast there was a spiculated mass in the upper-outer quadrant measuring 1.2 cm and associated with pleomorphic calcifications. There were calcifications extending anteriorly and posteriorly from the mass worrisome for ductal carcinoma in situ. The total area in question is about 3 cm. There were also multiple enlarged right axillary lymph nodes. The mass was palpated as an area of fullness at the 12:00 position of the breast 3 cm from the nipple. There were palpable enlarged right axillary lymph nodes in the right axilla. Ultrasonography of the right breast confirmed an irregular hypoechoic mass in the area in question measuring 1.6 cm. There were multiple abnormal appearing enlarged right axillary lymph nodes, the largest measuring 2.3 cm.  In the left breast, mammography showed an area of architectural distortion associated with the left nipple. This was not palpable. Ultrasonography of the left breast found no correlate. This area of the left breast was biopsied under tomography 08/24/2015. This showed (SAA [id number]) a complex sclerosing lesion and fibroadenoma.  On 08/23/2015 the patient underwent biopsy of the right breast mass in 1 of the abnormal right axillary lymph nodes. 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 Both biopsies were HER-2 nonamplified, the breast being ratio 1.27 and number per cell 1.90, and the lymph node 1 ratio 1.24 and number per cell 3.0.  On 08/26/2015 the patient underwent bilateral breast MRI. In  the left breast there was no suspicious enhancement. The left axilla was benign. In the right breast there was a mass described as in the upper inner quadrant measuring 2.3 cm. Less than a centimeter away from this mass there was a satellite nodule measuring 0.9 cm. In the right axilla there were multiple enlarged right axillary lymph nodes, including a confluent region measuring up to 4 cm. There were also enlarged level II lymph nodes present measuring up to 2.1 cm.  The patient's subsequent history is as detailed below   PAST MEDICAL HISTORY: Past Medical History:  Diagnosis Date   Anxiety    Arthritis 2008   Asthma 1953   age 32   Breast cancer ([place])    Cancer ([place])    breast cancer   Chronic vaginitis 1998   Fibromyalgia 1982   GERD (gastroesophageal reflux disease)    since at least 2011   Hemorrhoids 2009   History of radiation therapy 01/18/16-03/05/16   right breast/nodal areas 45 Gy, right breast boost 16 Gy   Hypoglycemia 2003   Hypothyroidism 1964   age 38   Interstitial cystitis 1998   Irritable bowel syndrome 06/14/2009   Qualifier: Diagnosis of  By: Patsy Baltimore RN, Denise     Menopausal symptoms    Osteoporosis 12/24/2006   Personal history of chemotherapy    Personal history of radiation therapy    Pneumonia 1979   varicella   PONV (postoperative nausea and vomiting) 1979    PAST  SURGICAL HISTORY: Past Surgical History:  Procedure Laterality Date   BLADDER HYPEREXTENSION     X4, prior to 01/2006   BLADDER SUSPENSION     BREAST BIOPSY     BREAST LUMPECTOMY Right    2017   BREAST LUMPECTOMY WITH NEEDLE LOCALIZATION AND AXILLARY LYMPH NODE DISSECTION Right 12/05/2015   Procedure: RIGHT BREAST NEEDLE LOCALIZED (WIRES X 2) LUMPECTOMY WITH AXILLARY LYMPH NODE DISSECTION;  Surgeon: Fanny Skates, MD;  Location: [facility];  Service: General;  Laterality: Right;   BREAST LUMPECTOMY WITH RADIOACTIVE SEED LOCALIZATION Left 12/05/2015   Procedure:  RADIOACTIVE SEED GUIDED LEFT BREAST LUMPECTOMY;  Surgeon: Fanny Skates, MD;  Location: [place] [facility];  Service: General;  Laterality: Left;   BREAST SURGERY     BUNIONECTOMY     prior to 01/2006   [place]  02/08/2006   DR. INGRAM   COLONOSCOPY     CORONARY STENT INTERVENTION N/A 01/01/2017   Procedure: CORONARY STENT INTERVENTION;  Surgeon: Jettie Booze, MD;  Location: [facility];  Service: Cardiovascular;  Laterality: N/A;   DENTAL SURGERY  2009-2016   13 dental implants   LEFT HEART CATH AND CORONARY ANGIOGRAPHY N/A 01/01/2017   Procedure: LEFT HEART CATH AND CORONARY ANGIOGRAPHY;  Surgeon: Jettie Booze, MD;  Location: [facility];  Service: Cardiovascular;  Laterality: N/A;   LEG SURGERY  07/06/2004   Broken tibia   NASAL SINUS SURGERY     X4; prior to 01/2006   [facility] REMOVAL Right 12/05/2015   Procedure: REMOVAL PORT-A-CATH;  Surgeon: Fanny Skates, MD;  Location: [facility];  Service: General;  Laterality: Right;   PORTACATH PLACEMENT Left 09/08/2015   Procedure: INSERTION OF PORT-A-CATH ;  Surgeon: Fanny Skates, MD;  Location: [facility];  Service: General;  Laterality: Left;    FAMILY HISTORY Family History  Problem Relation Age of Onset   Hypertension Mother    Heart disease Mother        d. 73   Parkinsonism Father        d. 69   Heart disease Sister    Breast cancer Sister 67       s/p mastectomy; reportedly negative BRCA1/2 testing approx 7 years ago   Melanoma Sister        d. 41s; hx of incresed sun exposure   Cancer Sister        dx. sarcoma of her face in her 44s; treated w/ MOHS surgery   Parkinsonism Paternal Aunt        d. 68s   Parkinsonism Paternal Uncle        d. late 88s   Kidney failure Maternal Grandmother 46   Heart attack Maternal Grandfather        d. 83s   Heart disease Paternal Grandmother        d. 85   Breast cancer Other 62        maternal great aunt (MGF's sister)   Kidney cancer Paternal Uncle 32       d. 5s; unsure about smoking status   The patient's father died at age 21 from complications of Parkinson's disease. The patient's mother died age 80 with heart disease. Mela had no brothers. She had 2 sisters. One sister was diagnosed with breast cancer at age 40. She was tested for the BRCA gene and was negative. The same sister also had melanoma diagnosed in her 64s. The patient  has one maternal great aunt diagnosed with breast cancer around age 99. There is no history of ovarian cancer in the family   GYNECOLOGIC HISTORY:  Patient's last menstrual period was 04/30/1996 (approximate). Menarche age 48, first live birth age 48. The patient is GX P2. She stopped having periods in her early 49s and took hormone replacement until her breast cancer diagnosis, April 2017.   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 He has been in remission for more than 10 years. Their daughter Nira Conn lives in [place] were she is Garment/textile technologist of the [employer]. Daughter Belenda Cruise is an infectious disease M.D. working and teaching at [employer] in [place]. The patient has 2 grandchildren. The family attends [employer]: Not in place   HEALTH MAINTENANCE: Social History   Tobacco Use   Smoking status: Former Smoker    Packs/day: 0.25    Years: 15.00    Pack years: 3.75    Types: Cigarettes   Smokeless tobacco: Never Used   Tobacco comment: social smoker/"on and off"  Substance Use Topics   Alcohol use: Yes    Alcohol/week: 0.0 standard drinks    Comment: social   Drug use: No     Colonoscopy: 2016/ [facility]  PAP: 2015  Bone density: October 2018, T-score: -2.7  Lipid  panel:  Allergies  Allergen Reactions   Chlorhexidine Other (See Comments)    [place] IS OK. The CLOTH WIPE causes reaction. Causes itching all over the area   Other Rash    Adhesive tape    Current Outpatient Medications  Medication Sig Dispense Refill   albuterol (PROVENTIL HFA;VENTOLIN HFA) 108 (90 Base) MCG/ACT inhaler Inhale 1-2 puffs into the lungs every 6 (six) hours as needed for wheezing or shortness of breath. Started in 1998     anastrozole (ARIMIDEX) 1 MG tablet Take 1 tablet (1 mg total) by mouth daily. 90 tablet 4   aspirin EC 81 MG EC tablet Take 1 tablet (81 mg total) by mouth daily. 30 tablet 0   BRILINTA 90 MG TABS tablet TAKE 1 TABLET(90 MG) BY MOUTH TWICE DAILY 60 tablet 2   buPROPion (WELLBUTRIN XL) 300 MG 24 hr tablet Take 300 mg by mouth daily.      Calcium Carbonate-Vitamin D (CALCIUM + D PO) Take 1 tablet by mouth at bedtime. Started approximately 1998     carisoprodol (SOMA) 350 MG tablet Take 350 mg by mouth 2 (two) times daily as needed for muscle spasms. Started in 2008.  2   cycloSPORINE (RESTASIS) 0.05 % ophthalmic emulsion Place 1 drop into both eyes 2 (two) times daily. 30 mL 12   diazepam (VALIUM) 5 MG tablet Take 5 mg by mouth every 6 (six) hours as needed for muscle spasms (interstitial cystitis). Started approximately 2008.     fluconazole (DIFLUCAN) 100 MG tablet Take 1 tablet (100 mg total) by mouth daily. 30 tablet 0   furosemide (LASIX) 20 MG tablet TAKE 1 TABLET(20 MG) BY MOUTH DAILY AS NEEDED FOR SHORTNESS OF BREATH 30 tablet 6   HYDROcodone-acetaminophen (NORCO) 10-325 MG tablet Take 1 tablet by mouth every 4 (four) hours as needed for moderate pain or severe pain (as needed for fibromyalgia and IC).      hydrOXYzine (ATARAX/VISTARIL) 25 MG tablet  Take 1 tablet by mouth at bedtime.     levothyroxine (SYNTHROID, LEVOTHROID) 75 MCG tablet Take 75 mcg by mouth daily.  2   LORazepam (ATIVAN) 2 MG tablet Take 1 tablet by mouth daily as  needed for anxiety.   5   metoprolol succinate (TOPROL-XL) 25 MG 24 hr tablet TAKE 1/2 TABLET(12.5 MG) BY MOUTH DAILY 45 tablet 3   minoxidil (ROGAINE) 2 % external solution Apply topically 2 (two) times daily.     nitroGLYCERIN (NITROSTAT) 0.4 MG SL tablet PLACE 1 TABLET UNDER TONGUE EVERY FIVE MINUTES AS NEEDED FOR CHEST PAIN 25 tablet 3   NONFORMULARY OR COMPOUNDED ITEM Vit E vaginal suppositories 263m/ml.  One pv three times weekly.  #36/4RF 36 each 4   ondansetron (ZOFRAN) 4 MG tablet Take 1 tablet by mouth daily as needed for nausea or vomiting.   3   pantoprazole (PROTONIX) 40 MG tablet Take 40 mg by mouth 2 (two) times daily. Started in 2012.     potassium chloride (K-DUR) 10 MEQ tablet TAKE 1 TABLET BY MOUTH AS NEEDED. TAKE WITH LASIX 30 tablet 11   prochlorperazine (COMPAZINE) 10 MG tablet TAKE 1 TABLET(10 MG) BY MOUTH EVERY 6 HOURS AS NEEDED FOR NAUSEA OR VOMITING 385 tablet 1   rosuvastatin (CRESTOR) 20 MG tablet Take 1 tablet (20 mg total) by mouth daily. 90 tablet 3   terconazole (TERAZOL 7) 0.4 % vaginal cream Place 1 applicator vaginally 2 (two) times daily as needed (irritation).      Vaginal Lubricant (REPLENS VA) Place vaginally daily.     Vitamin D, Ergocalciferol, (DRISDOL) 50000 units CAPS capsule TAKE 1 CAPSULE BY MOUTH EVERY 7 DAYS 12 capsule 4   zolpidem (AMBIEN) 10 MG tablet Take 10 mg by mouth at bedtime as needed for sleep.      No current facility-administered medications for this visit.      OBJECTIVE: Middle-aged white woman who appears well  There were no vitals filed for this visit.   There is no height or weight on file to calculate BMI.    ECOG FS:1 - Symptomatic but completely ambulatory   Right breast 08/15/2016     Right Breast 02/03/2018      LAB RESULTS:  CMP     Component Value Date/Time   NA 142 04/17/2018 1141   NA 138 03/11/2017 1524   K 4.0 04/17/2018 1141   K 3.8 03/11/2017 1524   CL 105 04/17/2018 1141   CO2 28  04/17/2018 1141   CO2 25 03/11/2017 1524   GLUCOSE 99 04/17/2018 1141   GLUCOSE 136 03/11/2017 1524   BUN 13 04/17/2018 1141   BUN 13.2 03/11/2017 1524   CREATININE 0.83 04/17/2018 1141   CREATININE 0.8 03/11/2017 1524   CALCIUM 9.2 04/17/2018 1141   CALCIUM 9.6 03/11/2017 1524   PROT 6.8 04/17/2018 1141   PROT 7.6 03/11/2017 1524   ALBUMIN 3.8 04/17/2018 1141   ALBUMIN 3.8 03/11/2017 1524   AST 22 04/17/2018 1141   AST 24 03/11/2017 1524   ALT 21 04/17/2018 1141   ALT 38 03/11/2017 1524   ALKPHOS 96 04/17/2018 1141   ALKPHOS 166 (H) 03/11/2017 1524   BILITOT 0.6 04/17/2018 1141   BILITOT 0.44 03/11/2017 1524   GFRNONAA >60 04/17/2018 1141   GFRAA >60 04/17/2018 1141    INo results found for: SPEP, UPEP  Lab Results  Component Value Date   WBC 8.4 04/17/2018   NEUTROABS 5.6 04/17/2018   HGB 13.1 04/17/2018  HCT 39.3 04/17/2018   MCV 96.3 04/17/2018   PLT 217 04/17/2018      Chemistry      Component Value Date/Time   NA 142 04/17/2018 1141   NA 138 03/11/2017 1524   K 4.0 04/17/2018 1141   K 3.8 03/11/2017 1524   CL 105 04/17/2018 1141   CO2 28 04/17/2018 1141   CO2 25 03/11/2017 1524   BUN 13 04/17/2018 1141   BUN 13.2 03/11/2017 1524   CREATININE 0.83 04/17/2018 1141   CREATININE 0.8 03/11/2017 1524      Component Value Date/Time   CALCIUM 9.2 04/17/2018 1141   CALCIUM 9.6 03/11/2017 1524   ALKPHOS 96 04/17/2018 1141   ALKPHOS 166 (H) 03/11/2017 1524   AST 22 04/17/2018 1141   AST 24 03/11/2017 1524   ALT 21 04/17/2018 1141   ALT 38 03/11/2017 1524   BILITOT 0.6 04/17/2018 1141   BILITOT 0.44 03/11/2017 1524       No results found for: LABCA2  No components found for: LABCA125  No results for input(s): INR in the last 168 hours.  Urinalysis    Component Value Date/Time   COLORURINE YELLOW 01/03/2017 1205   APPEARANCEUR HAZY (A) 01/03/2017 1205   LABSPEC 1.006 01/03/2017 1205   PHURINE 7.0 01/03/2017 1205   GLUCOSEU NEGATIVE 01/03/2017  1205   HGBUR SMALL (A) 01/03/2017 1205   BILIRUBINUR NEGATIVE 01/03/2017 1205   BILIRUBINUR n 10/01/2016 1316   KETONESUR NEGATIVE 01/03/2017 1205   PROTEINUR NEGATIVE 01/03/2017 1205   UROBILINOGEN 0.2 10/01/2016 1316   NITRITE NEGATIVE 01/03/2017 1205   LEUKOCYTESUR LARGE (A) 01/03/2017 1205      ELIGIBLE FOR AVAILABLE RESEARCH PROTOCOL: PALLAS   STUDIES: No results found.   ASSESSMENT: 73 y.o. [place] woman status post right breast upper outer quadrant and right axillary lymph node biopsy 08/23/2015, both positive for a clinical T1 N2, stage IIIA invasive ductal carcinoma, grade 2, strongly estrogen receptor positive, 5% progesterone receptor positive, with an MIB-120-30% and no HER-2 amplification  (a) biopsy of a 0.9 cm Right breast satellite nodule 09/13/2015 showed invasive ductal carcinoma, grade 2, E-cadherin strongly positive, with a prognostic panel identical to the larger right upper quadrant mass  (1) left breast biopsy 08/24/2015 shows a complex sclerosing lesion, excised 12/05/2015  (2) neoadjuvant chemotherapy consisting of cyclophosphamide and docetaxel every 21 days 4, with OnPro support, started 09/12/2015, completed 11/14/2015  (3) status post right lumpectomy and sentinel lymph node dissection 12/05/2015 for a residual pT2 pN3, stage IIIC invasive ductal carcinoma, grade 2, with a close anterior margin (skin)  (4) adjuvant radiation 01/18/16 - 11/6/17with capecitabine chemo-sensitization   1) Right breast/nodal areas: 45 Gy in 25 fractions                         2) Right breast boost: 16 Gy in 8 fractions  (5) capecitabine started 04/13/2016, discontinued January 2018 with mild/moderate side effects developing  (6) anastrozole started 06/01/2016  (a) bone density January 30, 2017 shows a T score of -2.7 (osteoporosis)  (7) genetics testing (Askenazi descent) 02/07/2016 through the Breast/Ovarian Cancer Panel offered by [facility] Hope Pigeon,  MD) i found no deleterious mutations in  ATM, BARD1, BRCA1, BRCA2, BRIP1, CDH1, CHEK2, FANCC, MLH1, MSH2, MSH6, NBN, PALB2, PMS2, PTEN, RAD51C, RAD51D, TP53, and XRCC2.  This panel also includes deletion/duplication analysis (without sequencing) for one gene, EPCAM.  (8) consented to PALLAS trial   (a) randomized to anastrozole alone  PLAN: Yalanda is now 2-1/2 years out from definitive surgery for her breast cancer with no evidence of disease recurrence.  This is very favorable.  She is tolerating anastrozole generally well.  She is having some of the expected side effects and she is managing them.  Plan is to continue that a minimum of 5 years at which point we will consider switching to tamoxifen for an additional 5 years  She maintains a good functional status, ECOG 1, and is exercising regularly.  Her concerns regarding her husband are very valid.  He does not have a normal immune system and it would be at very high risk if he became infected with the novel coronavirus.  She will have some lab work related to the study the first week in June assuming it is safe for her to come here for that.  She will have her bone density and mammography in October.  She will also have a CT of the chest in October.  She will then see me in early November.    I refilled her anastrozole and Restasis and wrote for some Diflucan that she may take as needed.  She knows to call for any other issues that may develop before that visit.    Tela Kotecki, Virgie Dad, MD  08/21/18 3:14 PM Medical Oncology and Hematology [facility] [address] [place], [place] [address] Tel. [phone]    Fax. [phone]  I, Jacqualyn Posey am acting as a Education administrator for Chauncey Cruel, MD.   I, Lurline Del MD, have reviewed the above documentation for accuracy and completeness, and I agree with the above.

## 2018-08-21 ENCOUNTER — Encounter: Payer: Self-pay | Admitting: *Deleted

## 2018-08-21 ENCOUNTER — Inpatient Hospital Stay: Payer: Medicare Other | Attending: Oncology | Admitting: Oncology

## 2018-08-21 DIAGNOSIS — C50411 Malignant neoplasm of upper-outer quadrant of right female breast: Secondary | ICD-10-CM | POA: Diagnosis not present

## 2018-08-21 DIAGNOSIS — Z9221 Personal history of antineoplastic chemotherapy: Secondary | ICD-10-CM

## 2018-08-21 DIAGNOSIS — Z87891 Personal history of nicotine dependence: Secondary | ICD-10-CM

## 2018-08-21 DIAGNOSIS — Z170 Estrogen receptor positive status [ER+]: Secondary | ICD-10-CM

## 2018-08-21 DIAGNOSIS — Z923 Personal history of irradiation: Secondary | ICD-10-CM

## 2018-08-21 DIAGNOSIS — Z006 Encounter for examination for normal comparison and control in clinical research program: Secondary | ICD-10-CM

## 2018-08-21 DIAGNOSIS — Z79811 Long term (current) use of aromatase inhibitors: Secondary | ICD-10-CM | POA: Diagnosis not present

## 2018-08-21 MED ORDER — CYCLOSPORINE 0.05 % OP EMUL: 1.0000 [drp] | Freq: Two times a day (BID) | OPHTHALMIC | 12 refills | Status: DC

## 2018-08-21 MED ORDER — ANASTROZOLE 1 MG PO TABS: 1.0000 mg | ORAL_TABLET | Freq: Every day | ORAL | 4 refills | Status: DC

## 2018-08-21 MED ORDER — FLUCONAZOLE 100 MG PO TABS: 100.0000 mg | ORAL_TABLET | Freq: Every day | ORAL | 0 refills | Status: DC

## 2018-08-22 ENCOUNTER — Encounter (HOSPITAL_COMMUNITY): Payer: Self-pay

## 2018-08-24 ENCOUNTER — Other Ambulatory Visit: Payer: Self-pay | Admitting: Interventional Cardiology

## 2018-08-25 ENCOUNTER — Encounter (HOSPITAL_COMMUNITY): Payer: Self-pay

## 2018-08-25 ENCOUNTER — Telehealth: Payer: Self-pay | Admitting: Oncology

## 2018-08-25 NOTE — Telephone Encounter (Signed)
Tried to reach regarding schedule °

## 2018-08-26 ENCOUNTER — Other Ambulatory Visit: Payer: Self-pay | Admitting: Interventional Cardiology

## 2018-08-27 ENCOUNTER — Encounter (HOSPITAL_COMMUNITY): Payer: Self-pay

## 2018-08-28 ENCOUNTER — Other Ambulatory Visit: Payer: Self-pay | Admitting: *Deleted

## 2018-09-01 NOTE — Research (Signed)
Late entry for 08/21/2018 [place] visit Due to COVID-19 pandemic restrictions, and per AFT guidance, patient EOT visit was conducted by video enabled telemedicine appointment with Dr. Jana Hakim, and with research nurse joining remotely.  Anti-hormone therapy: Patient continues to take anastrozole as prescribed.   Non-IP Drug Diaries: Postage-paid, pre-addressed envelopes will be mailed to the patient for return of completed anti-hormone therapy diaries for Cycles 24-26.   End of treatment research lab samples: Patient is aware that her lab appointment has been rescheduled to June 1st, at which time we will collect EOT study labs and research samples. However, this will be reassessed if the situation changes before then.  Cindy S. Brigitte Pulse BSN, RN, [place] 09/01/2018 8:20 PM

## 2018-09-05 ENCOUNTER — Encounter

## 2018-09-09 NOTE — Research (Signed)
09/09/2018  Drug diaries: Patient mailed in Drug diaries for Cycles 24-26, received on 09/01/2018 and 09/02/2018 as indicated on page 1 of each diary.  Previously reported adverse events were ongoing and unchanged in severity at the time of the 08/21/2018 study visit.  Adverse Event Log Protocol: AFT-05 PALLAS Cycles 24-EOTPV: 04/17/2018 - 08/21/2018 Baseline AEs - improved to grade 1: neuropathy (10/12/16) and fatigue (08/15/16) Event Grade Onset Date Resolved Date Attribution to anastrozole Treatment/ Comments  Vaginal dryness Grade 1 08/01/16 ongoing Yes   Urinary incontinence- stress  Grade 1  08/15/16  ongoing  No   Atrophic vaginitis Grade 2 09/13/16 ongoing Yes   Sinus bradycardia, int. Grade 1 12/31/16 ongoing No   LV diastolic dysfunction Grade 1 01/02/17 ongoing No   Hyperlipidemia Grade 2 01/02/17 ongoing Yes   Chronic diastolic CHF Grade 2 9/38/10 ongoing No   Weight loss Grade 2 03/11/17 ongoing No 12/19 grade 1  Bruising Grade 1 04/01/17 ongoing No   Hypertension Grade 1 11/04/17 ongoing No   Leg cramps Grade 1 01/12/18 ongoing No periodically  Restless leg syndrome Grade 1 01/12/18 ongoing No    Cindy S. Brigitte Pulse BSN, RN, [place] 09/09/2018 2:49 PM

## 2018-09-15 ENCOUNTER — Other Ambulatory Visit: Payer: Medicare Other

## 2018-09-29 ENCOUNTER — Telehealth: Payer: Self-pay | Admitting: *Deleted

## 2018-09-29 ENCOUNTER — Other Ambulatory Visit: Payer: Self-pay | Admitting: *Deleted

## 2018-09-29 ENCOUNTER — Ambulatory Visit (HOSPITAL_COMMUNITY): Admission: RE | Admit: 2018-09-29 | Payer: Medicare Other | Source: Ambulatory Visit

## 2018-09-29 ENCOUNTER — Other Ambulatory Visit: Payer: Self-pay

## 2018-09-29 ENCOUNTER — Other Ambulatory Visit: Payer: Medicare Other

## 2018-09-29 DIAGNOSIS — N760 Acute vaginitis: Secondary | ICD-10-CM

## 2018-09-29 NOTE — Telephone Encounter (Signed)
09/29/2018  Research - AFT-05 PALLAS - End of Treatment visit labs Left voice mail messages for patient at home and mobile numbers to confirm lab appointment for this afternoon, including samples for the research study. Also noted that patient's CT scan is still scheduled for this afternoon. Requested that patient call me at my direct number [phone] if she chooses not to come in today. Cindy S. Brigitte Pulse BSN, RN, [place] 09/29/2018 9:43 AM

## 2018-09-29 NOTE — Telephone Encounter (Signed)
Medication refill request: vitamin e vaginal suppositories 200mg /ml Last AEX:  09-17-17 Next AEX: 12-23-2018 Last MMG (if hormonal medication request): 01-31-18 category b density birads 2:neg Refill authorized: please approve if appropriate

## 2018-09-29 NOTE — Telephone Encounter (Signed)
09/29/2018 Received return phone call from patient, stating that she believed today's appointments had been cancelled and rescheduled to October, based on discussion during 08/21/2018 virtual visit. Voice mail message left for Dr. Virgie Dad nurse at [phone] regarding the above. Lab and CT scan are not required per protocol and may be rescheduled per MD preference. Cindy S. Brigitte Pulse BSN, RN, CCRP 09/29/2018 10:57 AM

## 2018-09-30 MED ORDER — NONFORMULARY OR COMPOUNDED ITEM: 4 refills | Status: DC

## 2018-10-10 ENCOUNTER — Other Ambulatory Visit: Payer: Self-pay | Admitting: Cardiology

## 2018-10-14 ENCOUNTER — Encounter: Payer: Self-pay | Admitting: *Deleted

## 2018-10-14 DIAGNOSIS — C50411 Malignant neoplasm of upper-outer quadrant of right female breast: Secondary | ICD-10-CM

## 2018-10-14 NOTE — Research (Signed)
10/14/2018 Spoke with patient by phone today to inform her of the Interim Analysis 2 findings for the AFT-05 PALLAS study, showing that "there was very little chance that the addition of palbociclib to endocrine therapy significantly reduced cancer recurrence, compared to endocrine therapy alone." In addition, the patient was informed that "there was no sign of unexpected toxicity or harm to people taking palbociclib with endocrine therapy". Explained to patient that the new study information does not alter her current participation in the Follow-up phase. Explained to patient that endocrine therapy will continue in the follow-up phase, as well as ongoing follow-up assessments. Patient is in agreement with this plan. Notified patient that she will be receiving a patient letter in the mail regarding the study findings to date. Patient appreciated being given this new information. Thanked patient for her participation and for her contributions to the trial. She is encouraged to contact our office for any questions or concerns that she might have, following receipt of the patient letter. Cindy S. Brigitte Pulse BSN, RN, [place] 10/14/2018 1:59 PM

## 2018-10-28 ENCOUNTER — Telehealth (HOSPITAL_COMMUNITY): Payer: Self-pay

## 2018-11-24 ENCOUNTER — Telehealth: Payer: Self-pay | Admitting: Physician Assistant

## 2018-11-24 DIAGNOSIS — I5032 Chronic diastolic (congestive) heart failure: Secondary | ICD-10-CM | POA: Insufficient documentation

## 2018-11-24 NOTE — Progress Notes (Signed)
Cardiology Office Note    Date:  11/25/2018   ID:  [name], DOB 1945/08/03, MRN [id number]  PCP:  Seward Carol, MD  Cardiologist: Larae Grooms, MD EPS: None  Chief Complaint  Patient presents with  . Follow-up    History of Present Illness:  [name] is a 73 y.o. female with history of CAD status post NSTEMI 01/01/2017 treated with DES x4 to the circumflex, mid LAD, mid RCA and distal RCA.  Also has hypertension, chronic diastolic CHF breast CA status post lumpectomy chemo and radiation, fibromyalgia and hypothyroidism.  2D echo 12/2016 LVEF 60 to 65% with grade 1 DD.  Last saw Dr. Irish Lack 10/28/2017 and was doing well.  Plan to switch from Brilinta to Plavix but patient preferred to stay on Brilinta.  Patient comes in for yearly f/u. Her daughter is an infectious disease MD at [name]. Patient walking 1-2 miles. Has an occasional "twinge" that she takes a NTG for which eases it but she also thinks it may be gas pain. No chest tightness like her MI. No shortness of breath, takes occasional lasix when her rings get tight.  She did have some dizziness about a month ago at which time her Fitbit showed a heart rate of 44 but it resolved quickly.  Has not noticed it since.  Past Medical History:  Diagnosis Date  . Anxiety   . Arthritis 2008  . Asthma 1953   age 20  . Breast cancer ([place])   . Cancer [facility]    breast cancer  . Chronic vaginitis 1998  . Fibromyalgia 1982  . GERD (gastroesophageal reflux disease)    since at least 2011  . Hemorrhoids 2009  . History of radiation therapy 01/18/16-03/05/16   right breast/nodal areas 45 Gy, right breast boost 16 Gy  . Hypoglycemia 2003  . Hypothyroidism 1964   age 73  . Interstitial cystitis 1998  . Irritable bowel syndrome 06/14/2009   Qualifier: Diagnosis of  By: Patsy Baltimore RN, Langley Gauss    . Menopausal symptoms   . Osteoporosis 12/24/2006  . Personal history of chemotherapy   . Personal history of radiation  therapy   . Pneumonia 1979   varicella  . PONV (postoperative nausea and vomiting) 1979    Past Surgical History:  Procedure Laterality Date  . BLADDER HYPEREXTENSION     X4, prior to 01/2006  . BLADDER SUSPENSION    . BREAST BIOPSY    . BREAST LUMPECTOMY Right    2017  . BREAST LUMPECTOMY WITH NEEDLE LOCALIZATION AND AXILLARY LYMPH NODE DISSECTION Right 12/05/2015   Procedure: RIGHT BREAST NEEDLE LOCALIZED (WIRES X 2) LUMPECTOMY WITH AXILLARY LYMPH NODE DISSECTION;  Surgeon: Fanny Skates, MD;  Location: [facility];  Service: General;  Laterality: Right;  . BREAST LUMPECTOMY WITH RADIOACTIVE SEED LOCALIZATION Left 12/05/2015   Procedure: RADIOACTIVE SEED GUIDED LEFT BREAST LUMPECTOMY;  Surgeon: Fanny Skates, MD;  Location: [place];  Service: General;  Laterality: Left;  . BREAST SURGERY    . BUNIONECTOMY     prior to 01/2006  . CARDIAC CATHETERIZATION    . CESAREAN SECTION  1979  . CHOLECYSTECTOMY  02/08/2006   DR. INGRAM  . COLONOSCOPY    . CORONARY STENT INTERVENTION N/A 01/01/2017   Procedure: CORONARY STENT INTERVENTION;  Surgeon: Jettie Booze, MD;  Location: [place] [facility];  Service: Cardiovascular;  Laterality: N/A;  . DENTAL SURGERY  2009-2016   13 dental implants  . LEFT HEART CATH AND  CORONARY ANGIOGRAPHY N/A 01/01/2017   Procedure: LEFT HEART CATH AND CORONARY ANGIOGRAPHY;  Surgeon: Jettie Booze, MD;  Location: [place] [facility];  Service: Cardiovascular;  Laterality: N/A;  . LEG SURGERY  07/06/2004   Broken tibia  . NASAL SINUS SURGERY     X4; prior to 01/2006  . PORT-A-CATH REMOVAL Right 12/05/2015   Procedure: REMOVAL PORT-A-CATH;  Surgeon: Fanny Skates, MD;  Location: [place];  Service: General;  Laterality: Right;  . PORTACATH PLACEMENT Left 09/08/2015   Procedure: INSERTION OF PORT-A-CATH ;  Surgeon: Fanny Skates, MD;  Location: [place];  Service: General;  Laterality: Left;    Current Medications: Current Meds  Medication Sig  . albuterol  (PROVENTIL HFA;VENTOLIN HFA) 108 (90 Base) MCG/ACT inhaler Inhale 1-2 puffs into the lungs every 6 (six) hours as needed for wheezing or shortness of breath. Started in 1998  . anastrozole (ARIMIDEX) 1 MG tablet Take 1 tablet (1 mg total) by mouth daily.  Marland Kitchen aspirin EC 81 MG EC tablet Take 1 tablet (81 mg total) by mouth daily.  Marland Kitchen BRILINTA 90 MG TABS tablet TAKE 1 TABLET(90 MG) BY MOUTH TWICE DAILY  . buPROPion (WELLBUTRIN XL) 300 MG 24 hr tablet Take 300 mg by mouth daily.   . Calcium Carbonate-Vitamin D (CALCIUM + D PO) Take 1 tablet by mouth at bedtime. Started approximately 1998  . carisoprodol (SOMA) 350 MG tablet Take 350 mg by mouth 2 (two) times daily as needed for muscle spasms. Started in 2008.  . cycloSPORINE (RESTASIS) 0.05 % ophthalmic emulsion Place 1 drop into both eyes 2 (two) times daily.  . diazepam (VALIUM) 5 MG tablet Take 5 mg by mouth every 6 (six) hours as needed for muscle spasms (interstitial cystitis). Started approximately 2008.  . fluconazole (DIFLUCAN) 100 MG tablet Take 1 tablet (100 mg total) by mouth daily.  . furosemide (LASIX) 20 MG tablet TAKE 1 TABLET(20 MG) BY MOUTH DAILY AS NEEDED FOR SHORTNESS OF BREATH  . HYDROcodone-acetaminophen (NORCO) 10-325 MG tablet Take 1 tablet by mouth every 4 (four) hours as needed for moderate pain or severe pain (as needed for fibromyalgia and IC).   . hydrOXYzine (ATARAX/VISTARIL) 25 MG tablet Take 1 tablet by mouth at bedtime.  Marland Kitchen levothyroxine (SYNTHROID, LEVOTHROID) 75 MCG tablet Take 75 mcg by mouth daily.  Marland Kitchen LORazepam (ATIVAN) 2 MG tablet Take 1 tablet by mouth daily as needed for anxiety.   . minoxidil (ROGAINE) 2 % external solution Apply topically 2 (two) times daily.  . nitroGLYCERIN (NITROSTAT) 0.4 MG SL tablet PLACE 1 TABLET UNDER TONGUE EVERY FIVE MINUTES AS NEEDED FOR CHEST PAIN  . NONFORMULARY OR COMPOUNDED ITEM Vit E vaginal suppositories 200mg /ml.  One pv three times weekly.  #36/4RF  . ondansetron (ZOFRAN) 4 MG  tablet Take 1 tablet by mouth daily as needed for nausea or vomiting.   . pantoprazole (PROTONIX) 40 MG tablet Take 40 mg by mouth daily. Started in 2012.  . potassium chloride (K-DUR) 10 MEQ tablet TAKE 1 TABLET BY MOUTH AS NEEDED. TAKE WITH LASIX  . prochlorperazine (COMPAZINE) 10 MG tablet TAKE 1 TABLET(10 MG) BY MOUTH EVERY 6 HOURS AS NEEDED FOR NAUSEA OR VOMITING  . rosuvastatin (CRESTOR) 20 MG tablet TAKE 1 TABLET(20 MG) BY MOUTH DAILY  . terconazole (TERAZOL 7) 0.4 % vaginal cream Place 1 applicator vaginally 2 (two) times daily as needed (irritation).   . Vaginal Lubricant (REPLENS VA) Place vaginally daily.  . Vitamin D, Ergocalciferol, (DRISDOL) 50000 units CAPS  capsule TAKE 1 CAPSULE BY MOUTH EVERY 7 DAYS  . zolpidem (AMBIEN) 10 MG tablet Take 10 mg by mouth at bedtime as needed for sleep.   . [DISCONTINUED] metoprolol succinate (TOPROL-XL) 25 MG 24 hr tablet TAKE 1/2 TABLET(12.5 MG) BY MOUTH DAILY  . [DISCONTINUED] nitroGLYCERIN (NITROSTAT) 0.4 MG SL tablet PLACE 1 TABLET UNDER TONGUE EVERY FIVE MINUTES AS NEEDED FOR CHEST PAIN     Allergies:   Chlorhexidine and Other   Social History   Socioeconomic History  . Marital status: Married    Spouse name: Not on file  . Number of children: 2  . Years of education: Not on file  . Highest education level: Not on file  Occupational History  . Not on file  Social Needs  . Financial resource strain: Not on file  . Food insecurity    Worry: Not on file    Inability: Not on file  . Transportation needs    Medical: Not on file    Non-medical: Not on file  Tobacco Use  . Smoking status: Former Smoker    Packs/day: 0.25    Years: 15.00    Pack years: 3.75    Types: Cigarettes  . Smokeless tobacco: Never Used  . Tobacco comment: social smoker/"on and off"  Substance and Sexual Activity  . Alcohol use: Yes    Alcohol/week: 0.0 standard drinks    Comment: social  . Drug use: No  . Sexual activity: Yes    Birth  control/protection: Post-menopausal  Lifestyle  . Physical activity    Days per week: Not on file    Minutes per session: Not on file  . Stress: Not on file  Relationships  . Social Herbalist on phone: Not on file    Gets together: Not on file    Attends religious service: Not on file    Active member of club or organization: Not on file    Attends meetings of clubs or organizations: Not on file    Relationship status: Not on file  Other Topics Concern  . Not on file  Social History Narrative  . Not on file     Family History:  The patient's  family history includes Breast cancer (age of onset: 102) in her sister; Breast cancer (age of onset: 42) in an other family member; Cancer in her sister; Heart attack in her maternal grandfather; Heart disease in her mother, paternal grandmother, and sister; Hypertension in her mother; Kidney cancer (age of onset: 55) in her paternal uncle; Kidney failure (age of onset: 38) in her maternal grandmother; Melanoma in her sister; Parkinsonism in her father, paternal aunt, and paternal uncle.   ROS:   Please see the history of present illness.    ROS All other systems reviewed and are negative.   PHYSICAL EXAM:   VS:  BP 130/70   Pulse (!) 53   Ht 5\' 2"  (1.575 m)   Wt 148 lb 12.8 oz (67.5 kg)   LMP 04/30/1996 (Approximate)   SpO2 94%   BMI 27.22 kg/m   Physical Exam  GEN: Well nourished, well developed, in no acute distress  Neck: no JVD, carotid bruits, or masses Cardiac:RRR; no murmurs, rubs, or gallops  Respiratory:  clear to auscultation bilaterally, normal work of breathing GI: soft, nontender, nondistended, + BS Ext: without cyanosis, clubbing, or edema, Good distal pulses bilaterally Neuro:  Alert and Oriented x 3 Psych: euthymic mood, full affect  Wt Readings from Last  3 Encounters:  11/25/18 148 lb 12.8 oz (67.5 kg)  04/17/18 144 lb 8 oz (65.5 kg)  02/03/18 142 lb 8 oz (64.6 kg)      Studies/Labs Reviewed:    EKG:  EKG is  ordered today.  The ekg ordered today demonstrates sinus bradycardia at 53 bpm with a low atrial escape rhythm reviewed with Dr. Curt Bears  Recent Labs: 04/17/2018: ALT 21; BUN 13; Creatinine 0.83; Hemoglobin 13.1; Platelet Count 217; Potassium 4.0; Sodium 142   Lipid Panel    Component Value Date/Time   CHOL 189 01/03/2017 0402   TRIG 83 01/03/2017 0402   HDL 67 01/03/2017 0402   CHOLHDL 2.8 01/03/2017 0402   VLDL 17 01/03/2017 0402   LDLCALC 105 (H) 01/03/2017 0402    Additional studies/ records that were reviewed today include:  2D echo 9/2018Study Conclusions   - Left ventricle: The cavity size was normal. Wall thickness was   normal. Systolic function was normal. The estimated ejection   fraction was in the range of 60% to 65%. Wall motion was normal;   there were no regional wall motion abnormalities. Doppler   parameters are consistent with abnormal left ventricular   relaxation (grade 1 diastolic dysfunction).   Impressions:   - Normal LV systolic function; mild diastolic dysfunction; strain   not performed.     Cardiac cath 9/4/2018The left ventricular systolic function is normal.  LV end diastolic pressure is normal.  The left ventricular ejection fraction is 55-65% by visual estimate.  There is no aortic valve stenosis.  Prox Cx to Mid Cx lesion, 95 %stenosed.  A STENT PROMUS PREM MR 2.75X16 drug eluting stent was successfully placed, postdilated to >61mm.  Post intervention, there is a 0% residual stenosis.  Mid LAD-2 lesion, 80 %stenosed.  A STENT PROMUS PREM MR 0.10X32 drug eluting stent was successfully placed, postdilated to >22mm.  Post intervention, there is a 0% residual stenosis.  Mid RCA lesion, 90 %stenosed.  A STENT PROMUS PREM MR 3.0X20 drug eluting stent was successfully placed, postdilated to 3.3 mm.  Post intervention, there is a 0% residual stenosis.  Dist RCA-2 lesion, 70 %stenosed.  A STENT PROMUS PREM MR 2.5X12  drug eluting stent was successfully placed.  Post intervention, there is a 10% residual stenosis.   Severe three vessel disease.  Given the patient's other comorbidities including multiple surgeries for breast cancer, she was adament that if PCI could be done she wanted to avoid CABG.  She is somewhat frail, having lost 40 lbs with chemotherapy.  After discussion with Dr. Tamala Julian, we proceded with multivessel PCI.     Successful DES placement to the mid LAD, mid circumflex, mid and distal RCA with short stents.   Continue DAPT for at least 12 months along with aggressive secondary prevention.     ASSESSMENT:    1. Coronary artery disease involving native coronary artery of native heart without angina pectoris   2. Essential hypertension   3. Chronic diastolic CHF (congestive heart failure) ([place])   4. Bradycardia      PLAN:  In order of problems listed above:  CAD status post stenting to the circumflex, LAD, mid RCA and distal RCA 2018 normal LVEF with grade 1 DD on 2D echo in 2018-no angina on brilinta and ASA  Chronic diastolic CHF takes prn lasix   Essential hypertension BP well controlled on Toprol XL but we are stopping this.  Patient should keep an eye on her blood pressure and we may  need to start another agent if it goes up.  Hyperlipidemia on Crestor LDL 72 on recent FLP  Sinus bradycardia low atrial escape rhythm on EKG.  Patient asymptomatic.  Reviewed with Dr. Curt Bears.  Will stop Toprol XL 12.5 mg daily and bring her back in 2 to 3 weeks for another EKG.  Recent TSH was normal according to patient.  Asked her to bring her lab results  Medication Adjustments/Labs and Tests Ordered: Current medicines are reviewed at length with the patient today.  Concerns regarding medicines are outlined above.  Medication changes, Labs and Tests ordered today are listed in the Patient Instructions below. Patient Instructions  Medication Instructions:  DISCONTINUE: Metoprolol   If you  need a refill on your cardiac medications before your next appointment, please call your pharmacy.   Lab work: None   If you have labs (blood work) drawn today and your tests are completely normal, you will receive your results only by: Marland Kitchen MyChart Message (if you have MyChart) OR . A paper copy in the mail If you have any lab test that is abnormal or we need to change your treatment, we will call you to review the results.  Testing/Procedures: None   Follow-Up: You are scheduled to see Dr. Irish Lack on 02/04/2019 @ 1:20 PM  You are scheduled for a follow up EKG on 12/24/2018 @ 12:00 PM  Any Other Special Instructions Will Be Listed Below (If Applicable).       Signed, Ermalinda Barrios, PA-C  11/25/2018 2:27 PM    [facility] [place], [place],   [address] Phone: [phone]; Fax: [phone]

## 2018-11-24 NOTE — Telephone Encounter (Signed)

## 2018-11-25 ENCOUNTER — Ambulatory Visit (INDEPENDENT_AMBULATORY_CARE_PROVIDER_SITE_OTHER): Payer: Medicare Other | Admitting: Physician Assistant

## 2018-11-25 ENCOUNTER — Other Ambulatory Visit: Payer: Self-pay

## 2018-11-25 ENCOUNTER — Encounter: Payer: Self-pay | Admitting: Physician Assistant

## 2018-11-25 VITALS — BP 130/70 | HR 53 | Ht 62.0 in | Wt 148.8 lb

## 2018-11-25 DIAGNOSIS — I2510 Atherosclerotic heart disease of native coronary artery without angina pectoris: Secondary | ICD-10-CM | POA: Diagnosis not present

## 2018-11-25 DIAGNOSIS — R001 Bradycardia, unspecified: Secondary | ICD-10-CM | POA: Diagnosis not present

## 2018-11-25 DIAGNOSIS — I10 Essential (primary) hypertension: Secondary | ICD-10-CM | POA: Diagnosis not present

## 2018-11-25 DIAGNOSIS — I5032 Chronic diastolic (congestive) heart failure: Secondary | ICD-10-CM | POA: Diagnosis not present

## 2018-11-25 MED ORDER — NITROGLYCERIN 0.4 MG SL SUBL: SUBLINGUAL_TABLET | SUBLINGUAL | 3 refills | Status: DC

## 2018-11-25 NOTE — Patient Instructions (Addendum)
Medication Instructions:  DISCONTINUE: Metoprolol   If you need a refill on your cardiac medications before your next appointment, please call your pharmacy.   Lab work: None   If you have labs (blood work) drawn today and your tests are completely normal, you will receive your results only by: Marland Kitchen MyChart Message (if you have MyChart) OR . A paper copy in the mail If you have any lab test that is abnormal or we need to change your treatment, we will call you to review the results.  Testing/Procedures: None   Follow-Up: You are scheduled to see Dr. Irish Lack on 02/04/2019 @ 1:20 PM  You are scheduled for a follow up EKG on 12/24/2018 @ 12:00 PM  Any Other Special Instructions Will Be Listed Below (If Applicable).

## 2018-11-26 ENCOUNTER — Telehealth: Payer: Self-pay | Admitting: Physician Assistant

## 2018-11-26 NOTE — Telephone Encounter (Signed)
°  The patient was calling to follow up with what was discussed at her appointment with Estella Husk yesterday.  The first question she had was in regards to her medication. She states that Sharyn Lull wanted her to stop taking the metoprolol. The patient wants to know if she should taper off of the medication, or just stop all at once.  The second question was in regards to a diagnosis that was printed on her AVS. The AVS states that the patient has a diagnosis of  Congestive Heart Failure. This is the first that the patient has recalled seeing the diagnosis, and she would like some more details about this diagnosis.   Please follow up with the patient.

## 2018-11-26 NOTE — Telephone Encounter (Signed)
Spoke with patient regarding her questions from yesterday's visit. I advised patient she may stop Toprol XL 12.5 mg all at once since it is a low dose. I explained the diagnosis of chronic diastolic dysfunction and answered questions to her satisfaction. She verbalized understanding and thanked me for the thorough explanation. She also asks about the bradycardia seen on ECG yesterday and I explained the reason for the repeat ECG on 8/26. I answered questions to her satisfaction. I advised her to call back with any concerns prior to her follow-up visit and she thanked me for the call.

## 2018-12-03 ENCOUNTER — Other Ambulatory Visit: Payer: Self-pay | Admitting: Obstetrics & Gynecology

## 2018-12-03 ENCOUNTER — Other Ambulatory Visit: Payer: Self-pay | Admitting: Interventional Cardiology

## 2018-12-03 MED ORDER — ROSUVASTATIN CALCIUM 20 MG PO TABS: ORAL_TABLET | ORAL | 3 refills | Status: DC

## 2018-12-03 NOTE — Telephone Encounter (Signed)
Medication refill request: vitamin d 50,000iu Last AEX:  09-17-17 Next AEX: 12-23-2018 Last MMG (if hormonal medication request): n/a Refill authorized: please approve if appropriate

## 2018-12-23 ENCOUNTER — Ambulatory Visit: Payer: Medicare Other | Admitting: Obstetrics & Gynecology

## 2018-12-23 DIAGNOSIS — E785 Hyperlipidemia, unspecified: Secondary | ICD-10-CM | POA: Insufficient documentation

## 2018-12-23 NOTE — Progress Notes (Deleted)
73 y.o. G21P2002 Married White or Caucasian female here for annual exam.    Patient's last menstrual period was 04/30/1996 (approximate).          Sexually active: {yes no:314532}  The current method of family planning is post menopausal status.    Exercising: {yes no:314532}  {types:19826} Smoker:  {YES [id number]  Health Maintenance: Pap:  09/07/16 Neg  History of abnormal Pap:  no MMG:  01/31/18 Diagnostic Bilateral BIRADS2:benign. F/u 1 year  Colonoscopy:  01/01/14  BMD:   01/30/17 osteoporosis   TDaP:  2016 Pneumonia vaccine(s):  2015 Shingrix:   *** Hep C testing: *** Screening Labs: ***   reports that she has quit smoking. Her smoking use included cigarettes. She has a 3.75 pack-year smoking history. She has never used smokeless tobacco. She reports current alcohol use. She reports that she does not use drugs.  Past Medical History:  Diagnosis Date  . Anxiety   . Arthritis 2008  . Asthma 1953   age 68  . Breast cancer ([place])   . Cancer [facility])    breast cancer  . Chronic vaginitis 1998  . Fibromyalgia 1982  . GERD (gastroesophageal reflux disease)    since at least 2011  . Hemorrhoids 2009  . History of radiation therapy 01/18/16-03/05/16   right breast/nodal areas 45 Gy, right breast boost 16 Gy  . Hypoglycemia 2003  . Hypothyroidism 1964   age 38  . Interstitial cystitis 1998  . Irritable bowel syndrome 06/14/2009   Qualifier: Diagnosis of  By: Patsy Baltimore RN, Langley Gauss    . Menopausal symptoms   . Osteoporosis 12/24/2006  . Personal history of chemotherapy   . Personal history of radiation therapy   . Pneumonia 1979   varicella  . PONV (postoperative nausea and vomiting) 1979    Past Surgical History:  Procedure Laterality Date  . BLADDER HYPEREXTENSION     X4, prior to 01/2006  . BLADDER SUSPENSION    . BREAST BIOPSY    . BREAST LUMPECTOMY Right    2017  . BREAST LUMPECTOMY WITH NEEDLE LOCALIZATION AND AXILLARY LYMPH NODE DISSECTION Right 12/05/2015   Procedure:  RIGHT BREAST NEEDLE LOCALIZED (WIRES X 2) LUMPECTOMY WITH AXILLARY LYMPH NODE DISSECTION;  Surgeon: Fanny Skates, MD;  Location: [place];  Service: General;  Laterality: Right;  . BREAST LUMPECTOMY WITH RADIOACTIVE SEED LOCALIZATION Left 12/05/2015   Procedure: RADIOACTIVE SEED GUIDED LEFT BREAST LUMPECTOMY;  Surgeon: Fanny Skates, MD;  Location: [place];  Service: General;  Laterality: Left;  . BREAST SURGERY    . BUNIONECTOMY     prior to 01/2006  . CARDIAC CATHETERIZATION    . CESAREAN SECTION  1979  . CHOLECYSTECTOMY  02/08/2006   DR. INGRAM  . COLONOSCOPY    . CORONARY STENT INTERVENTION N/A 01/01/2017   Procedure: CORONARY STENT INTERVENTION;  Surgeon: Jettie Booze, MD;  Location: [place] [facility];  Service: Cardiovascular;  Laterality: N/A;  . DENTAL SURGERY  2009-2016   13 dental implants  . LEFT HEART CATH AND CORONARY ANGIOGRAPHY N/A 01/01/2017   Procedure: LEFT HEART CATH AND CORONARY ANGIOGRAPHY;  Surgeon: Jettie Booze, MD;  Location: [place] [facility];  Service: Cardiovascular;  Laterality: N/A;  . LEG SURGERY  07/06/2004   Broken tibia  . NASAL SINUS SURGERY     X4; prior to 01/2006  . PORT-A-CATH REMOVAL Right 12/05/2015   Procedure: REMOVAL PORT-A-CATH;  Surgeon: Fanny Skates, MD;  Location: [place];  Service: General;  Laterality: Right;  .  PORTACATH PLACEMENT Left 09/08/2015   Procedure: INSERTION OF PORT-A-CATH ;  Surgeon: Fanny Skates, MD;  Location: [place];  Service: General;  Laterality: Left;    Current Outpatient Medications  Medication Sig Dispense Refill  . albuterol (PROVENTIL HFA;VENTOLIN HFA) 108 (90 Base) MCG/ACT inhaler Inhale 1-2 puffs into the lungs every 6 (six) hours as needed for wheezing or shortness of breath. Started in 1998    . anastrozole (ARIMIDEX) 1 MG tablet Take 1 tablet (1 mg total) by mouth daily. 90 tablet 4  . aspirin EC 81 MG EC tablet Take 1 tablet (81 mg total) by mouth daily. 30 tablet 0  . BRILINTA 90 MG TABS tablet  TAKE 1 TABLET(90 MG) BY MOUTH TWICE DAILY 60 tablet 2  . buPROPion (WELLBUTRIN XL) 300 MG 24 hr tablet Take 300 mg by mouth daily.     . Calcium Carbonate-Vitamin D (CALCIUM + D PO) Take 1 tablet by mouth at bedtime. Started approximately 1998    . carisoprodol (SOMA) 350 MG tablet Take 350 mg by mouth 2 (two) times daily as needed for muscle spasms. Started in 2008.  2  . cycloSPORINE (RESTASIS) 0.05 % ophthalmic emulsion Place 1 drop into both eyes 2 (two) times daily. 30 mL 12  . diazepam (VALIUM) 5 MG tablet Take 5 mg by mouth every 6 (six) hours as needed for muscle spasms (interstitial cystitis). Started approximately 2008.    . fluconazole (DIFLUCAN) 100 MG tablet Take 1 tablet (100 mg total) by mouth daily. 30 tablet 0  . furosemide (LASIX) 20 MG tablet TAKE 1 TABLET(20 MG) BY MOUTH DAILY AS NEEDED FOR SHORTNESS OF BREATH 30 tablet 6  . HYDROcodone-acetaminophen (NORCO) 10-325 MG tablet Take 1 tablet by mouth every 4 (four) hours as needed for moderate pain or severe pain (as needed for fibromyalgia and IC).     . hydrOXYzine (ATARAX/VISTARIL) 25 MG tablet Take 1 tablet by mouth at bedtime.    Marland Kitchen levothyroxine (SYNTHROID, LEVOTHROID) 75 MCG tablet Take 75 mcg by mouth daily.  2  . LORazepam (ATIVAN) 2 MG tablet Take 1 tablet by mouth daily as needed for anxiety.   5  . minoxidil (ROGAINE) 2 % external solution Apply topically 2 (two) times daily.    . nitroGLYCERIN (NITROSTAT) 0.4 MG SL tablet PLACE 1 TABLET UNDER TONGUE EVERY FIVE MINUTES AS NEEDED FOR CHEST PAIN 25 tablet 3  . NONFORMULARY OR COMPOUNDED ITEM Vit E vaginal suppositories 236m/ml.  One pv three times weekly.  #36/4RF 36 each 4  . ondansetron (ZOFRAN) 4 MG tablet Take 1 tablet by mouth daily as needed for nausea or vomiting.   3  . pantoprazole (PROTONIX) 40 MG tablet Take 40 mg by mouth daily. Started in 2012.    . potassium chloride (K-DUR) 10 MEQ tablet TAKE 1 TABLET BY MOUTH AS NEEDED. TAKE WITH LASIX 30 tablet 11  .  prochlorperazine (COMPAZINE) 10 MG tablet TAKE 1 TABLET(10 MG) BY MOUTH EVERY 6 HOURS AS NEEDED FOR NAUSEA OR VOMITING 385 tablet 1  . rosuvastatin (CRESTOR) 20 MG tablet TAKE 1 TABLET(20 MG) BY MOUTH DAILY 90 tablet 3  . terconazole (TERAZOL 7) 0.4 % vaginal cream Place 1 applicator vaginally 2 (two) times daily as needed (irritation).     . Vaginal Lubricant (REPLENS VA) Place vaginally daily.    . Vitamin D, Ergocalciferol, (DRISDOL) 1.25 MG (50000 UT) CAPS capsule TAKE 1 CAPSULE BY MOUTH EVERY 7 DAYS 12 capsule 0  . zolpidem (AMBIEN) 10 MG  tablet Take 10 mg by mouth at bedtime as needed for sleep.      No current facility-administered medications for this visit.     Family History  Problem Relation Age of Onset  . Hypertension Mother   . Heart disease Mother        d. 61  . Parkinsonism Father        d. 99  . Heart disease Sister   . Breast cancer Sister 73       s/p mastectomy; reportedly negative BRCA1/2 testing approx 7 years ago  . Melanoma Sister        d. 23s; hx of incresed sun exposure  . Cancer Sister        dx. sarcoma of her face in her 78s; treated w/ MOHS surgery  . Parkinsonism Paternal Aunt        d. 90s  . Parkinsonism Paternal Uncle        d. late 30s  . Kidney failure Maternal Grandmother 57  . Heart attack Maternal Grandfather        d. 46s  . Heart disease Paternal Grandmother        d. 77  . Breast cancer Other 110       maternal great aunt (MGF's sister)  . Kidney cancer Paternal Uncle 33       d. 66s; unsure about smoking status    Review of Systems  Exam:   LMP 04/30/1996 (Approximate)   Height:      Ht Readings from Last 3 Encounters:  11/25/18 _0  (1.575 m)  04/17/18 _1  (1.575 m)  02/03/18 _2  (1.575 m)    General appearance: alert, cooperative and appears stated age Head: Normocephalic, without obvious abnormality, atraumatic Neck: no adenopathy, supple, symmetrical, trachea midline and thyroid {EXAM; THYROID:18604} Lungs: clear  to auscultation bilaterally Breasts: {Exam; breast:13139::"normal appearance, no masses or tenderness"} Heart: regular rate and rhythm Abdomen: soft, non-tender; bowel sounds normal; no masses,  no organomegaly Extremities: extremities normal, atraumatic, no cyanosis or edema Skin: Skin color, texture, turgor normal. No rashes or lesions Lymph nodes: Cervical, supraclavicular, and axillary nodes normal. No abnormal inguinal nodes palpated Neurologic: Grossly normal   Pelvic: External genitalia:  no lesions              Urethra:  normal appearing urethra with no masses, tenderness or lesions              Bartholins and Skenes: normal                 Vagina: normal appearing vagina with normal color and discharge, no lesions              Cervix: {exam; cervix:14595}              Pap taken: {yes no:314532} Bimanual Exam:  Uterus:  {exam; uterus:12215}              Adnexa: {exam; adnexa:12223}               Rectovaginal: Confirms               Anus:  normal sphincter tone, no lesions  Chaperone was present for exam.  A:  Well Woman with normal exam  P:   {plan; gyn:5269::"mammogram","pap smear","return annually or prn"}

## 2018-12-23 NOTE — Progress Notes (Signed)
Cardiology Office Note    Date:  12/24/2018   ID:  [name] 30-Nov-1945, MRN [id number]  PCP:  Brianna Carol, MD  Cardiologist: Brianna Grooms, MD EPS: None  Chief Complaint  Patient presents with  . Follow-up    History of Present Illness:  [name] is a 73 y.o. female whose daughter is an infectious disease MD at [employer].  She has a  history of CAD status post NSTEMI 01/01/2017 treated with DES x4 to the circumflex, mid LAD, mid RCA and distal RCA.  Also has hypertension, chronic diastolic CHF breast CA status post lumpectomy chemo and radiation, fibromyalgia and hypothyroidism.  2D echo 12/2016 LVEF 60 to 65% with grade 1 DD.   Last saw [name] 10/28/2017 and was doing well.  Plan to switch from Brilinta to Plavix but patient preferred to stay on Brilinta.  I saw the patient 11/25/2018 at which time she was feeling well.  EKG showed sinus bradycardia with a low atrial escape rhythm.  I reviewed this with Dr. Curt [name] who recommended stopping the Toprol.  She is here for follow-up EKG.  TSH by an outside lab was reported as normal according to the patient.  Patient comes in today for follow-up.  Feeling well.  Denies cardiac complaints.   Past Medical History:  Diagnosis Date  . Anxiety   . Arthritis 2008  . Asthma 1953   age 32  . Breast cancer ([place])   . Cancer [facility])    breast cancer  . Chronic vaginitis 1998  . Fibromyalgia 1982  . GERD (gastroesophageal reflux disease)    since at least 2011  . Hemorrhoids 2009  . History of radiation therapy 01/18/16-03/05/16   right breast/nodal areas 45 Gy, right breast boost 16 Gy  . Hypoglycemia 2003  . Hypothyroidism 1964   age 62  . Interstitial cystitis 1998  . Irritable bowel syndrome 06/14/2009   Qualifier: Diagnosis of  By: Brianna Baltimore RN, [name]    . Menopausal symptoms   . Osteoporosis 12/24/2006  . Personal history of chemotherapy   . Personal history of radiation therapy   .  Pneumonia 1979   varicella  . PONV (postoperative nausea and vomiting) 1979    Past Surgical History:  Procedure Laterality Date  . BLADDER HYPEREXTENSION     X4, prior to 01/2006  . BLADDER SUSPENSION    . BREAST BIOPSY    . BREAST LUMPECTOMY Right    2017  . BREAST LUMPECTOMY WITH NEEDLE LOCALIZATION AND AXILLARY LYMPH NODE DISSECTION Right 12/05/2015   Procedure: RIGHT BREAST NEEDLE LOCALIZED (WIRES X 2) LUMPECTOMY WITH AXILLARY LYMPH NODE DISSECTION;  Surgeon: Brianna Skates, MD;  Location: [place];  Service: General;  Laterality: Right;  . BREAST LUMPECTOMY WITH RADIOACTIVE SEED LOCALIZATION Left 12/05/2015   Procedure: RADIOACTIVE SEED GUIDED LEFT BREAST LUMPECTOMY;  Surgeon: Brianna Skates, MD;  Location: [place];  Service: General;  Laterality: Left;  . BREAST SURGERY    . BUNIONECTOMY     prior to 01/2006  . CARDIAC CATHETERIZATION    . CESAREAN SECTION  1979  . CHOLECYSTECTOMY  02/08/2006   [name]  . COLONOSCOPY    . CORONARY STENT INTERVENTION N/A 01/01/2017   Procedure: CORONARY STENT INTERVENTION;  Surgeon: Brianna Booze, MD;  Location: [place] [facility];  Service: Cardiovascular;  Laterality: N/A;  . DENTAL SURGERY  2009-2016   13 dental implants  . LEFT HEART CATH AND CORONARY ANGIOGRAPHY N/A 01/01/2017  Procedure: LEFT HEART CATH AND CORONARY ANGIOGRAPHY;  Surgeon: Brianna Booze, MD;  Location: [place] [facility];  Service: Cardiovascular;  Laterality: N/A;  . LEG SURGERY  07/06/2004   Broken tibia  . NASAL SINUS SURGERY     X4; prior to 01/2006  . PORT-A-CATH REMOVAL Right 12/05/2015   Procedure: REMOVAL PORT-A-CATH;  Surgeon: Brianna Skates, MD;  Location: [place];  Service: General;  Laterality: Right;  . PORTACATH PLACEMENT Left 09/08/2015   Procedure: INSERTION OF PORT-A-CATH ;  Surgeon: Brianna Skates, MD;  Location: [place];  Service: General;  Laterality: Left;    Current Medications: Current Meds  Medication Sig  . albuterol (PROVENTIL  HFA;VENTOLIN HFA) 108 (90 Base) MCG/ACT inhaler Inhale 1-2 puffs into the lungs every 6 (six) hours as needed for wheezing or shortness of breath. Started in 1998  . anastrozole (ARIMIDEX) 1 MG tablet Take 1 tablet (1 mg total) by mouth daily.  Marland Kitchen aspirin EC 81 MG EC tablet Take 1 tablet (81 mg total) by mouth daily.  Marland Kitchen BRILINTA 90 MG TABS tablet TAKE 1 TABLET(90 MG) BY MOUTH TWICE DAILY  . buPROPion (WELLBUTRIN XL) 300 MG 24 hr tablet Take 300 mg by mouth daily.   . Calcium Carbonate-Vitamin D (CALCIUM + D PO) Take 1 tablet by mouth at bedtime. Started approximately 1998  . carisoprodol (SOMA) 350 MG tablet Take 350 mg by mouth 2 (two) times daily as needed for muscle spasms. Started in 2008.  . cycloSPORINE (RESTASIS) 0.05 % ophthalmic emulsion Place 1 drop into both eyes 2 (two) times daily.  . diazepam (VALIUM) 5 MG tablet Take 5 mg by mouth every 6 (six) hours as needed for muscle spasms (interstitial cystitis). Started approximately 2008.  . fluconazole (DIFLUCAN) 100 MG tablet Take 1 tablet (100 mg total) by mouth daily.  . furosemide (LASIX) 20 MG tablet TAKE 1 TABLET(20 MG) BY MOUTH DAILY AS NEEDED FOR SHORTNESS OF BREATH  . HYDROcodone-acetaminophen (NORCO) 10-325 MG tablet Take 1 tablet by mouth every 4 (four) hours as needed for moderate pain or severe pain (as needed for fibromyalgia and IC).   . hydrOXYzine (ATARAX/VISTARIL) 25 MG tablet Take 1 tablet by mouth at bedtime.  Marland Kitchen levothyroxine (SYNTHROID, LEVOTHROID) 75 MCG tablet Take 75 mcg by mouth daily.  Marland Kitchen LORazepam (ATIVAN) 2 MG tablet Take 1 tablet by mouth daily as needed for anxiety.   . minoxidil (ROGAINE) 2 % external solution Apply topically 2 (two) times daily.  . nitroGLYCERIN (NITROSTAT) 0.4 MG SL tablet PLACE 1 TABLET UNDER TONGUE EVERY FIVE MINUTES AS NEEDED FOR CHEST PAIN  . NONFORMULARY OR COMPOUNDED ITEM Vit E vaginal suppositories 200mg /ml.  One pv three times weekly.  #36/4RF  . ondansetron (ZOFRAN) 4 MG tablet Take 1  tablet by mouth daily as needed for nausea or vomiting.   . pantoprazole (PROTONIX) 40 MG tablet Take 40 mg by mouth daily. Started in 2012.  . potassium chloride (K-DUR) 10 MEQ tablet TAKE 1 TABLET BY MOUTH AS NEEDED. TAKE WITH LASIX  . prochlorperazine (COMPAZINE) 10 MG tablet TAKE 1 TABLET(10 MG) BY MOUTH EVERY 6 HOURS AS NEEDED FOR NAUSEA OR VOMITING  . rosuvastatin (CRESTOR) 20 MG tablet TAKE 1 TABLET(20 MG) BY MOUTH DAILY  . terconazole (TERAZOL 7) 0.4 % vaginal cream Place 1 applicator vaginally 2 (two) times daily as needed (irritation).   . Vaginal Lubricant (REPLENS VA) Place vaginally daily.  . Vitamin D, Ergocalciferol, (DRISDOL) 1.25 MG (50000 UT) CAPS capsule TAKE 1 CAPSULE  BY MOUTH EVERY 7 DAYS  . zolpidem (AMBIEN) 10 MG tablet Take 10 mg by mouth at bedtime as needed for sleep.      Allergies:   Chlorhexidine and Other   Social History   Socioeconomic History  . Marital status: Married    Spouse name: Not on file  . Number of children: 2  . Years of education: Not on file  . Highest education level: Not on file  Occupational History  . Not on file  Social Needs  . Financial resource strain: Not on file  . Food insecurity    Worry: Not on file    Inability: Not on file  . Transportation needs    Medical: Not on file    Non-medical: Not on file  Tobacco Use  . Smoking status: Former Smoker    Packs/day: 0.25    Years: 15.00    Pack years: 3.75    Types: Cigarettes  . Smokeless tobacco: Never Used  . Tobacco comment: social smoker/"on and off"  Substance and Sexual Activity  . Alcohol use: Yes    Alcohol/week: 0.0 standard drinks    Comment: social  . Drug use: No  . Sexual activity: Yes    Birth control/protection: Post-menopausal  Lifestyle  . Physical activity    Days per week: Not on file    Minutes per session: Not on file  . Stress: Not on file  Relationships  . Social Herbalist on phone: Not on file    Gets together: Not on file     Attends religious service: Not on file    Active member of club or organization: Not on file    Attends meetings of clubs or organizations: Not on file    Relationship status: Not on file  Other Topics Concern  . Not on file  Social History Narrative  . Not on file     Family History:  The patient's family history includes Breast cancer (age of onset: 45) in her sister; Breast cancer (age of onset: 20) in an other family member; Cancer in her sister; Heart attack in her maternal grandfather; Heart disease in her mother, paternal grandmother, and sister; Hypertension in her mother; Kidney cancer (age of onset: 75) in her paternal uncle; Kidney failure (age of onset: 29) in her maternal grandmother; Melanoma in her sister; Parkinsonism in her father, paternal aunt, and paternal uncle.   ROS:   Please see the history of present illness.    ROS All other systems reviewed and are negative.   PHYSICAL EXAM:   VS:  BP 118/78   Pulse 68   Ht 5\' 2"  (1.575 m)   Wt 148 lb 12.8 oz (67.5 kg)   LMP 04/30/1996 (Approximate)   SpO2 95%   BMI 27.22 kg/m   Physical Exam  GEN: Well nourished, well developed, in no acute distress  Neck: no JVD, carotid bruits, or masses Cardiac:RRR; no murmurs, rubs, or gallops  Respiratory:  clear to auscultation bilaterally, normal work of breathing GI: soft, nontender, nondistended, + BS Ext: without cyanosis, clubbing, or edema, Good distal pulses bilaterally Neuro:  Alert and Oriented x 3 Psych: euthymic mood, full affect  Wt Readings from Last 3 Encounters:  12/24/18 148 lb 12.8 oz (67.5 kg)  11/25/18 148 lb 12.8 oz (67.5 kg)  04/17/18 144 lb 8 oz (65.5 kg)      Studies/Labs Reviewed:   EKG:  EKG is  ordered today.  The ekg ordered  today demonstrates normal sinus rhythm with some sinus arrhythmia looks like junctional rhythm rate has improved  Recent Labs: 04/17/2018: ALT 21; BUN 13; Creatinine 0.83; Hemoglobin 13.1; Platelet Count 217; Potassium  4.0; Sodium 142   Lipid Panel    Component Value Date/Time   CHOL 189 01/03/2017 0402   TRIG 83 01/03/2017 0402   HDL 67 01/03/2017 0402   CHOLHDL 2.8 01/03/2017 0402   VLDL 17 01/03/2017 0402   LDLCALC 105 (H) 01/03/2017 0402    Additional studies/ records that were reviewed today include:  2D echo 9/2018Study Conclusions   - Left ventricle: The cavity size was normal. Wall thickness was   normal. Systolic function was normal. The estimated ejection   fraction was in the range of 60% to 65%. Wall motion was normal;   there were no regional wall motion abnormalities. Doppler   parameters are consistent with abnormal left ventricular   relaxation (grade 1 diastolic dysfunction).   Impressions:   - Normal LV systolic function; mild diastolic dysfunction; strain   not performed.      Cardiac cath 9/4/2018The left ventricular systolic function is normal.  LV end diastolic pressure is normal.  The left ventricular ejection fraction is 55-65% by visual estimate.  There is no aortic valve stenosis.  Prox Cx to Mid Cx lesion, 95 %stenosed.  A STENT PROMUS PREM MR 2.75X16 drug eluting stent was successfully placed, postdilated to >69mm.  Post intervention, there is a 0% residual stenosis.  Mid LAD-2 lesion, 80 %stenosed.  A STENT PROMUS PREM MR [id number] drug eluting stent was successfully placed, postdilated to >16mm.  Post intervention, there is a 0% residual stenosis.  Mid RCA lesion, 90 %stenosed.  A STENT PROMUS PREM MR 3.0X20 drug eluting stent was successfully placed, postdilated to 3.3 mm.  Post intervention, there is a 0% residual stenosis.  Dist RCA-2 lesion, 70 %stenosed.  A STENT PROMUS PREM MR 2.5X12 drug eluting stent was successfully placed.  Post intervention, there is a 10% residual stenosis.   Severe three vessel disease.  Given the patient's other comorbidities including multiple surgeries for breast cancer, she was adament that if PCI could be done  she wanted to avoid CABG.  She is somewhat frail, having lost 40 lbs with chemotherapy.  After discussion with Dr. Tamala Julian, we proceded with multivessel PCI.     Successful DES placement to the mid LAD, mid circumflex, mid and distal RCA with short stents.   Continue DAPT for at least 12 months along with aggressive secondary prevention.        ASSESSMENT:    1. Coronary artery disease involving native coronary artery of native heart without angina pectoris   2. Chronic diastolic CHF (congestive heart failure) ([place])   3. Essential hypertension   4. Hyperlipidemia, unspecified hyperlipidemia type   5. Bradycardia      PLAN:  In order of problems listed above:  CAD S/P stenting to the circumflex, LAD, mid RCA and distal RCA 2018 normal LVEF with grade 1 DD on 2D echo in 2018-no angina on brilinta and ASA   Chronic diastolic CHF on prn lasix   Essential HTN BP well controlled-she brought readings from home and they were all stable off Toprol  Hyperlipidemia on Crestor LDL 72  Sinus bradycardia with low atrial escape rhythm on EKG 11/25/2018 toprol stopped.  Now normal sinus rhythm with some junctional rhythm but overall stable and improved rate at 68 bpm.  Follow-up with [name] in October.  Medication Adjustments/Labs and Tests Ordered: Current medicines are reviewed at length with the patient today.  Concerns regarding medicines are outlined above.  Medication changes, Labs and Tests ordered today are listed in the Patient Instructions below. Patient Instructions  Medication Instructions:  Your physician recommends that you continue on your current medications as directed. Please refer to the Current Medication list given to you today.  If you need a refill on your cardiac medications before your next appointment, please call your pharmacy.   Lab work: NONE If you have labs (blood work) drawn today and your tests are completely normal, you will receive your results only  by: Marland Kitchen MyChart Message (if you have MyChart) OR . A paper copy in the mail If you have any lab test that is abnormal or we need to change your treatment, we will call you to review the results.  Testing/Procedures: NONE  Follow-Up Appt with [name] 02/04/19 at 1:20  Any Other Special Instructions Will Be Listed Below (If Applicable).       Sumner Boast, PA-C  12/24/2018 12:19 PM    [facility] [place], [place], [place]  [address] Phone: [phone]; Fax: [phone]

## 2018-12-24 ENCOUNTER — Ambulatory Visit (INDEPENDENT_AMBULATORY_CARE_PROVIDER_SITE_OTHER): Payer: Medicare Other | Admitting: Physician Assistant

## 2018-12-24 ENCOUNTER — Other Ambulatory Visit: Payer: Self-pay

## 2018-12-24 ENCOUNTER — Encounter: Payer: Self-pay | Admitting: Physician Assistant

## 2018-12-24 VITALS — BP 118/78 | HR 68 | Ht 62.0 in | Wt 148.8 lb

## 2018-12-24 DIAGNOSIS — E785 Hyperlipidemia, unspecified: Secondary | ICD-10-CM | POA: Diagnosis not present

## 2018-12-24 DIAGNOSIS — I10 Essential (primary) hypertension: Secondary | ICD-10-CM

## 2018-12-24 DIAGNOSIS — I2510 Atherosclerotic heart disease of native coronary artery without angina pectoris: Secondary | ICD-10-CM

## 2018-12-24 DIAGNOSIS — I5032 Chronic diastolic (congestive) heart failure: Secondary | ICD-10-CM

## 2018-12-24 DIAGNOSIS — R001 Bradycardia, unspecified: Secondary | ICD-10-CM

## 2018-12-24 NOTE — Patient Instructions (Addendum)
Medication Instructions:  Your physician recommends that you continue on your current medications as directed. Please refer to the Current Medication list given to you today.  If you need a refill on your cardiac medications before your next appointment, please call your pharmacy.   Lab work: NONE If you have labs (blood work) drawn today and your tests are completely normal, you will receive your results only by: Marland Kitchen MyChart Message (if you have MyChart) OR . A paper copy in the mail If you have any lab test that is abnormal or we need to change your treatment, we will call you to review the results.  Testing/Procedures: NONE  Follow-Up Appt with Dr. Irish Lack 02/04/19 at 1:20  Any Other Special Instructions Will Be Listed Below (If Applicable).

## 2018-12-25 ENCOUNTER — Other Ambulatory Visit: Payer: Self-pay

## 2018-12-29 ENCOUNTER — Other Ambulatory Visit: Payer: Self-pay

## 2018-12-29 ENCOUNTER — Ambulatory Visit: Payer: Medicare Other | Admitting: Obstetrics & Gynecology

## 2018-12-29 ENCOUNTER — Encounter: Payer: Self-pay | Admitting: Obstetrics & Gynecology

## 2018-12-29 ENCOUNTER — Other Ambulatory Visit (HOSPITAL_COMMUNITY)
Admission: RE | Admit: 2018-12-29 | Discharge: 2018-12-29 | Disposition: A | Discharge status: Home / Self Care | Payer: Medicare Other | Source: Ambulatory Visit | Attending: Obstetrics & Gynecology | Admitting: Obstetrics & Gynecology

## 2018-12-29 ENCOUNTER — Ambulatory Visit (INDEPENDENT_AMBULATORY_CARE_PROVIDER_SITE_OTHER): Payer: Medicare Other | Admitting: Obstetrics & Gynecology

## 2018-12-29 VITALS — BP 116/86 | HR 68 | Temp 97.0°F | Ht 62.0 in | Wt 148.0 lb

## 2018-12-29 DIAGNOSIS — Z01419 Encounter for gynecological examination (general) (routine) without abnormal findings: Secondary | ICD-10-CM

## 2018-12-29 DIAGNOSIS — M810 Age-related osteoporosis without current pathological fracture: Secondary | ICD-10-CM | POA: Diagnosis not present

## 2018-12-29 DIAGNOSIS — Z124 Encounter for screening for malignant neoplasm of cervix: Secondary | ICD-10-CM

## 2018-12-29 DIAGNOSIS — N760 Acute vaginitis: Secondary | ICD-10-CM

## 2018-12-29 MED ORDER — NONFORMULARY OR COMPOUNDED ITEM: 4 refills | Status: DC

## 2018-12-29 NOTE — Patient Instructions (Addendum)
Check with Dr. Delfina Redwood about whether you've had a Hep C test done in the past.    [facility],  [place]  [address] [address]: [phone]  Sunday:Closed Monday:7:30 AM - 6:00 PM Tuesday:7:30 AM - 6:00 PM Wednesday:7:30 AM - 6:00 PM Thursday:7:30 AM - 6:00 PM Friday:7:30 AM - 6:00 PM Saturday:Closed

## 2018-12-29 NOTE — Progress Notes (Signed)
73 y.o. G29P2002 Married White or Caucasian female here for annual exam.  Had bradycardia about a month ago.  Was taken off metoprolol and this has resolved.     Has appt with Dr. Jana Hakim in November.  Had virtual visit in April.  Will have BMD and diagnostic MMG in October.    Occasional has some vaginal spotting.  Using Vit E vaginal suppositories and Replens.  Needs RF for Vit E vaginal suppositories.    Patient's last menstrual period was 04/30/1996 (approximate).          Sexually active: No.  The current method of family planning is post menopausal status.    Exercising: Yes.     Smoker:  no  Health Maintenance: Pap:  09/07/16 Neg   02/06/12 neg  History of abnormal Pap:  no MMG:  01/31/18 Diagnostic Bilateral. BIRADS2:Benign. F/u 1 year  Colonoscopy:  01/01/14 f/u 5 years.  Pt aware this is due.   BMD:   01/30/17 Osteoporosis TDaP:  2016 Pneumonia vaccine(s):  2015 Shingrix:   Had 1.  Discussed with pt finishing series.  Information provided.    Hep C testing:  Not  Screening Labs: PCP   reports that she has quit smoking. Her smoking use included cigarettes. She has a 3.75 pack-year smoking history. She has never used smokeless tobacco. She reports current alcohol use. She reports that she does not use drugs.  Past Medical History:  Diagnosis Date  . Anxiety   . Arthritis 2008  . Asthma 1953   age 28  . Breast cancer ([place])   . Cancer [facility])    breast cancer  . Chronic vaginitis 1998  . Fibromyalgia 1982  . GERD (gastroesophageal reflux disease)    since at least 2011  . Hemorrhoids 2009  . History of radiation therapy 01/18/16-03/05/16   right breast/nodal areas 45 Gy, right breast boost 16 Gy  . Hypoglycemia 2003  . Hypothyroidism 1964   age 89  . Interstitial cystitis 1998  . Irritable bowel syndrome 06/14/2009   Qualifier: Diagnosis of  By: Patsy Baltimore RN, Langley Gauss    . Menopausal symptoms   . Osteoporosis 12/24/2006  . Personal history of chemotherapy   . Personal  history of radiation therapy   . Pneumonia 1979   varicella  . PONV (postoperative nausea and vomiting) 1979    Past Surgical History:  Procedure Laterality Date  . BLADDER HYPEREXTENSION     X4, prior to 01/2006  . BLADDER SUSPENSION    . BREAST BIOPSY    . BREAST LUMPECTOMY Right    2017  . BREAST LUMPECTOMY WITH NEEDLE LOCALIZATION AND AXILLARY LYMPH NODE DISSECTION Right 12/05/2015   Procedure: RIGHT BREAST NEEDLE LOCALIZED (WIRES X 2) LUMPECTOMY WITH AXILLARY LYMPH NODE DISSECTION;  Surgeon: Fanny Skates, MD;  Location: [place];  Service: General;  Laterality: Right;  . BREAST LUMPECTOMY WITH RADIOACTIVE SEED LOCALIZATION Left 12/05/2015   Procedure: RADIOACTIVE SEED GUIDED LEFT BREAST LUMPECTOMY;  Surgeon: Fanny Skates, MD;  Location: [place];  Service: General;  Laterality: Left;  . BREAST SURGERY    . BUNIONECTOMY     prior to 01/2006  . CARDIAC CATHETERIZATION    . CESAREAN SECTION  1979  . CHOLECYSTECTOMY  02/08/2006   DR. INGRAM  . COLONOSCOPY    . CORONARY STENT INTERVENTION N/A 01/01/2017   Procedure: CORONARY STENT INTERVENTION;  Surgeon: Jettie Booze, MD;  Location: [place] CV [facility];  Service: Cardiovascular;  Laterality: N/A;  . DENTAL SURGERY  2009-2016   13 dental implants  . LEFT HEART CATH AND CORONARY ANGIOGRAPHY N/A 01/01/2017   Procedure: LEFT HEART CATH AND CORONARY ANGIOGRAPHY;  Surgeon: Jettie Booze, MD;  Location: [place] [facility] [facility];  Service: Cardiovascular;  Laterality: N/A;  . LEG SURGERY  07/06/2004   Broken tibia  . NASAL SINUS SURGERY     X4; prior to 01/2006  . PORT-A-CATH REMOVAL Right 12/05/2015   Procedure: REMOVAL PORT-A-CATH;  Surgeon: Fanny Skates, MD;  Location: [place];  Service: General;  Laterality: Right;  . PORTACATH PLACEMENT Left 09/08/2015   Procedure: INSERTION OF PORT-A-CATH ;  Surgeon: Fanny Skates, MD;  Location: [place];  Service: General;  Laterality: Left;    Current Outpatient Medications  Medication Sig  Dispense Refill  . albuterol (PROVENTIL HFA;VENTOLIN HFA) 108 (90 Base) MCG/ACT inhaler Inhale 1-2 puffs into the lungs every 6 (six) hours as needed for wheezing or shortness of breath. Started in 1998    . anastrozole (ARIMIDEX) 1 MG tablet Take 1 tablet (1 mg total) by mouth daily. 90 tablet 4  . aspirin EC 81 MG EC tablet Take 1 tablet (81 mg total) by mouth daily. 30 tablet 0  . BRILINTA 90 MG TABS tablet TAKE 1 TABLET(90 MG) BY MOUTH TWICE DAILY 60 tablet 2  . buPROPion (WELLBUTRIN XL) 300 MG 24 hr tablet Take 300 mg by mouth daily.     . Calcium Carbonate-Vitamin D (CALCIUM + D PO) Take 1 tablet by mouth at bedtime. Started approximately 1998    . carisoprodol (SOMA) 350 MG tablet Take 350 mg by mouth 2 (two) times daily as needed for muscle spasms. Started in 2008.  2  . cycloSPORINE (RESTASIS) 0.05 % ophthalmic emulsion Place 1 drop into both eyes 2 (two) times daily. 30 mL 12  . diazepam (VALIUM) 5 MG tablet Take 5 mg by mouth every 6 (six) hours as needed for muscle spasms (interstitial cystitis). Started approximately 2008.    . fluconazole (DIFLUCAN) 100 MG tablet Take 1 tablet (100 mg total) by mouth daily. 30 tablet 0  . furosemide (LASIX) 20 MG tablet TAKE 1 TABLET(20 MG) BY MOUTH DAILY AS NEEDED FOR SHORTNESS OF BREATH 30 tablet 6  . HYDROcodone-acetaminophen (NORCO) 10-325 MG tablet Take 1 tablet by mouth every 4 (four) hours as needed for moderate pain or severe pain (as needed for fibromyalgia and IC).     . hydrOXYzine (ATARAX/VISTARIL) 25 MG tablet Take 1 tablet by mouth at bedtime.    Marland Kitchen levothyroxine (SYNTHROID, LEVOTHROID) 75 MCG tablet Take 75 mcg by mouth daily.  2  . LORazepam (ATIVAN) 2 MG tablet Take 1 tablet by mouth daily as needed for anxiety.   5  . minoxidil (ROGAINE) 2 % external solution Apply topically 2 (two) times daily.    . nitroGLYCERIN (NITROSTAT) 0.4 MG SL tablet PLACE 1 TABLET UNDER TONGUE EVERY FIVE MINUTES AS NEEDED FOR CHEST PAIN 25 tablet 3  .  NONFORMULARY OR COMPOUNDED ITEM Vit E vaginal suppositories 263m/ml.  One pv three times weekly.  #36/4RF 36 each 4  . ondansetron (ZOFRAN) 4 MG tablet Take 1 tablet by mouth daily as needed for nausea or vomiting.   3  . pantoprazole (PROTONIX) 40 MG tablet Take 40 mg by mouth daily. Started in 2012.    . potassium chloride (K-DUR) 10 MEQ tablet TAKE 1 TABLET BY MOUTH AS NEEDED. TAKE WITH LASIX 30 tablet 11  . prochlorperazine (COMPAZINE) 10 MG tablet TAKE 1 TABLET(10 MG)  BY MOUTH EVERY 6 HOURS AS NEEDED FOR NAUSEA OR VOMITING 385 tablet 1  . rosuvastatin (CRESTOR) 20 MG tablet TAKE 1 TABLET(20 MG) BY MOUTH DAILY 90 tablet 3  . Vaginal Lubricant (REPLENS VA) Place vaginally daily.    . Vitamin D, Ergocalciferol, (DRISDOL) 1.25 MG (50000 UT) CAPS capsule TAKE 1 CAPSULE BY MOUTH EVERY 7 DAYS 12 capsule 0  . zolpidem (AMBIEN) 10 MG tablet Take 10 mg by mouth at bedtime as needed for sleep.      No current facility-administered medications for this visit.     Family History  Problem Relation Age of Onset  . Hypertension Mother   . Heart disease Mother        d. 53  . Parkinsonism Father        d. 33  . Heart disease Sister   . Breast cancer Sister 73       s/p mastectomy; reportedly negative BRCA1/2 testing approx 7 years ago  . Melanoma Sister        d. 68s; hx of incresed sun exposure  . Cancer Sister        dx. sarcoma of her face in her 48s; treated w/ MOHS surgery  . Parkinsonism Paternal Aunt        d. 90s  . Parkinsonism Paternal Uncle        d. late 5s  . Kidney failure Maternal Grandmother 69  . Heart attack Maternal Grandfather        d. 35s  . Heart disease Paternal Grandmother        d. 22  . Breast cancer Other 69       maternal great aunt (MGF's sister)  . Kidney cancer Paternal Uncle 37       d. 77s; unsure about smoking status    Review of Systems  All other systems reviewed and are negative.   Exam:   BP 116/86   Pulse 68   Temp (!) 97 F (36.1 C)  (Temporal)   Ht 5' 2"  (1.575 m)   Wt 148 lb (67.1 kg)   LMP 04/30/1996 (Approximate)   BMI 27.07 kg/m     Height: 5' 2"  (157.5 cm)  Ht Readings from Last 3 Encounters:  12/29/18 5' 2"  (1.575 m)  12/24/18 5' 2"  (1.575 m)  11/25/18 5' 2"  (1.575 m)    General appearance: alert, cooperative and appears stated age Head: Normocephalic, without obvious abnormality, atraumatic Neck: no adenopathy, supple, symmetrical, trachea midline and thyroid normal to inspection and palpation Lungs: clear to auscultation bilaterally Breasts: normal appearance, no masses or tenderness Heart: regular rate and rhythm Abdomen: soft, non-tender; bowel sounds normal; no masses,  no organomegaly Extremities: extremities normal, atraumatic, no cyanosis or edema Skin: Skin color, texture, turgor normal. No rashes or lesions Lymph nodes: Cervical, supraclavicular, and axillary nodes normal. No abnormal inguinal nodes palpated Neurologic: Grossly normal   Pelvic: External genitalia:  no lesions              Urethra:  normal appearing urethra with no masses, tenderness or lesions              Bartholins and Skenes: normal                 Vagina: normal appearing vagina with normal color and discharge, no lesions              Cervix: no lesions  Pap taken: Yes.   Bimanual Exam:  Uterus:  normal size, contour, position, consistency, mobility, non-tender              Adnexa: normal adnexa and no mass, fullness, tenderness               Rectovaginal: Confirms               Anus:  normal sphincter tone, no lesions  Chaperone was present for exam.  A:  Well Woman with normal exam PMP, no HRT H/o stage 3 invasive ductal cancer, s/p lumpectomy and LND on the right, chemo/radiation.  On Arimidex. Osteoporosis in the spine.  BMD due in October. Asthma Atrophic vaginal changes, uses Vit E and Replens I.C.  P:   Mammogram guidelines reviewed.  Diagnostic due in October BMD order placed. pap smear  obtained today She is advised to just complete shingrix vaccination.  Up to date reviewed. RF for Vit E 200iu/ml vaginal suppositories, one pv three times weekly to pharmacy. Will needs diflucan and/or terazol Rx at some point this year.  She will call. She is aware colonoscopy is due. She iwll check with Dr. Delfina Redwood about whether Hep C testing has been done. return annually or prn

## 2018-12-30 ENCOUNTER — Ambulatory Visit: Payer: Medicare Other | Admitting: Obstetrics & Gynecology

## 2018-12-30 LAB — CYTOLOGY - PAP: Diagnosis: NEGATIVE

## 2018-12-31 ENCOUNTER — Other Ambulatory Visit: Payer: Self-pay | Admitting: Oncology

## 2018-12-31 ENCOUNTER — Encounter: Payer: Self-pay | Admitting: Obstetrics & Gynecology

## 2018-12-31 ENCOUNTER — Other Ambulatory Visit: Payer: Self-pay | Admitting: Obstetrics & Gynecology

## 2018-12-31 DIAGNOSIS — Z9889 Other specified postprocedural states: Secondary | ICD-10-CM

## 2018-12-31 DIAGNOSIS — Z853 Personal history of malignant neoplasm of breast: Secondary | ICD-10-CM

## 2019-01-24 ENCOUNTER — Other Ambulatory Visit: Payer: Self-pay | Admitting: Cardiology

## 2019-01-30 ENCOUNTER — Telehealth: Payer: Self-pay | Admitting: *Deleted

## 2019-01-30 NOTE — Telephone Encounter (Signed)
01/30/2019 [facility] AFT-05 PALLAS - Follow-Up Phase I Month 30 phone call Spoke with patient by phone today to complete the Month 30 telephone call visit for the PALLAS study. Patient states she is doing well and keeping up with her health maintenance activities.  Anti-cancer medications - Patient states she is continuing to take anastrozole without interruption; she is on no new anti-cancer medications.   Serious adverse events - Patient denies any new, serious health problems since her last visit. She recently had an annual follow-up visit with her gynecologist..    Disease monitoring - No concerns related to disease recurrence have been raised. Mammogram is scheduled for February 09, 2019.  Patient is scheduled to return to clinic on 03/03/2019 for follow-up visit with Dr. Jana Hakim. Previously ordered CT scan for follow-up, rescheduled from the spring during the pandemic shut-down, is ordered to be completed on 02/24/2019. Patient to contact the office if the procedure has not been scheduled by 02/17/2019.  Cindy S. Brigitte Pulse BSN, RN, [place] 01/30/2019 2:21 PM
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
## 2019-02-04 ENCOUNTER — Other Ambulatory Visit: Payer: Self-pay

## 2019-02-04 ENCOUNTER — Ambulatory Visit (INDEPENDENT_AMBULATORY_CARE_PROVIDER_SITE_OTHER): Payer: Medicare Other | Admitting: Interventional Cardiology

## 2019-02-04 ENCOUNTER — Encounter: Payer: Self-pay | Admitting: Interventional Cardiology

## 2019-02-04 VITALS — BP 114/70 | HR 64 | Ht 62.0 in | Wt 150.4 lb

## 2019-02-04 DIAGNOSIS — I5032 Chronic diastolic (congestive) heart failure: Secondary | ICD-10-CM | POA: Diagnosis not present

## 2019-02-04 DIAGNOSIS — E782 Mixed hyperlipidemia: Secondary | ICD-10-CM

## 2019-02-04 DIAGNOSIS — I252 Old myocardial infarction: Secondary | ICD-10-CM | POA: Diagnosis not present

## 2019-02-04 DIAGNOSIS — I10 Essential (primary) hypertension: Secondary | ICD-10-CM

## 2019-02-04 DIAGNOSIS — I2510 Atherosclerotic heart disease of native coronary artery without angina pectoris: Secondary | ICD-10-CM

## 2019-02-04 NOTE — Patient Instructions (Signed)
Medication Instructions:  Your physician has recommended you make the following change in your medication:   1. STOP: aspirin  2. CONTINUE: brilinta   Lab work: None Ordered  If you have labs (blood work) drawn today and your tests are completely normal, you will receive your results only by: Marland Kitchen MyChart Message (if you have MyChart) OR . A paper copy in the mail If you have any lab test that is abnormal or we need to change your treatment, we will call you to review the results.  Testing/Procedures: None ordered  Follow-Up: At [facility], you and your health needs are our priority.  As part of our continuing mission to provide you with exceptional heart care, we have created designated Provider Care Teams.  These Care Teams include your primary Cardiologist (physician) and Advanced Practice Providers (APPs -  Physician Assistants and Nurse Practitioners) who all work together to provide you with the care you need, when you need it. . You will need a follow up appointment in 1 year.  Please call our office 2 months in advance to schedule this appointment.  You may see Casandra Doffing, MD or one of the following Advanced Practice Providers on your designated Care Team:   . Lyda Jester, PA-C . Dayna Dunn, PA-C . Ermalinda Barrios, PA-C  Any Other Special Instructions Will Be Listed Below (If Applicable).

## 2019-02-09 ENCOUNTER — Ambulatory Visit
Admission: RE | Admit: 2019-02-09 | Discharge: 2019-02-09 | Disposition: A | Discharge status: Home / Self Care | Payer: Medicare Other | Source: Ambulatory Visit | Attending: Oncology | Admitting: Oncology

## 2019-02-09 ENCOUNTER — Other Ambulatory Visit: Payer: Self-pay

## 2019-02-09 DIAGNOSIS — Z9889 Other specified postprocedural states: Secondary | ICD-10-CM

## 2019-02-09 DIAGNOSIS — Z853 Personal history of malignant neoplasm of breast: Secondary | ICD-10-CM

## 2019-02-11 ENCOUNTER — Other Ambulatory Visit: Payer: Self-pay

## 2019-02-11 DIAGNOSIS — C50411 Malignant neoplasm of upper-outer quadrant of right female breast: Secondary | ICD-10-CM

## 2019-02-23 ENCOUNTER — Other Ambulatory Visit: Payer: Self-pay | Admitting: Obstetrics & Gynecology

## 2019-02-24 ENCOUNTER — Encounter (HOSPITAL_COMMUNITY): Payer: Self-pay

## 2019-02-24 ENCOUNTER — Inpatient Hospital Stay: Payer: Medicare Other | Attending: Oncology

## 2019-02-24 ENCOUNTER — Other Ambulatory Visit: Payer: Self-pay

## 2019-02-24 ENCOUNTER — Ambulatory Visit (HOSPITAL_COMMUNITY)
Admission: RE | Admit: 2019-02-24 | Discharge: 2019-02-24 | Disposition: A | Discharge status: Home / Self Care | Payer: Medicare Other | Source: Ambulatory Visit | Attending: Oncology | Admitting: Oncology

## 2019-02-24 DIAGNOSIS — Z170 Estrogen receptor positive status [ER+]: Secondary | ICD-10-CM | POA: Insufficient documentation

## 2019-02-24 DIAGNOSIS — C50411 Malignant neoplasm of upper-outer quadrant of right female breast: Secondary | ICD-10-CM | POA: Insufficient documentation

## 2019-02-24 LAB — CMP (CANCER CENTER ONLY)
ALT: 15 U/L (ref 0–44)
AST: 19 U/L (ref 15–41)
Albumin: 3.9 g/dL (ref 3.5–5.0)
Alkaline Phosphatase: 83 U/L (ref 38–126)
Anion gap: 12 (ref 5–15)
BUN: 13 mg/dL (ref 8–23)
CO2: 22 mmol/L (ref 22–32)
Calcium: 9.1 mg/dL (ref 8.9–10.3)
Chloride: 105 mmol/L (ref 98–111)
Creatinine: 0.83 mg/dL (ref 0.44–1.00)
GFR, Est AFR Am: >60 mL/min (ref >60)
GFR, Estimated: >60 mL/min (ref >60)
Glucose, Bld: 97 mg/dL (ref 70–99)
Potassium: 4.3 mmol/L (ref 3.5–5.1)
Sodium: 139 mmol/L (ref 135–145)
Total Bilirubin: 0.5 mg/dL (ref 0.3–1.2)
Total Protein: 7.1 g/dL (ref 6.5–8.1)

## 2019-02-24 LAB — CBC WITH DIFFERENTIAL (CANCER CENTER ONLY)
Abs Immature Granulocytes: 0.01 10*3/uL (ref 0.00–0.07)
Basophils Absolute: 0 10*3/uL (ref 0.0–0.1)
Basophils Relative: 0 %
Eosinophils Absolute: 0.1 10*3/uL (ref 0.0–0.5)
Eosinophils Relative: 2 %
HCT: 40.1 % (ref 36.0–46.0)
Hemoglobin: 13.5 g/dL (ref 12.0–15.0)
Immature Granulocytes: 0 %
Lymphocytes Relative: 23 %
Lymphs Abs: 1.6 10*3/uL (ref 0.7–4.0)
MCH: 31.9 pg (ref 26.0–34.0)
MCHC: 33.7 g/dL (ref 30.0–36.0)
MCV: 94.8 fL (ref 80.0–100.0)
Monocytes Absolute: 0.5 10*3/uL (ref 0.1–1.0)
Monocytes Relative: 8 %
Neutro Abs: 4.5 10*3/uL (ref 1.7–7.7)
Neutrophils Relative %: 67 %
Platelet Count: 258 10*3/uL (ref 150–400)
RBC: 4.23 MIL/uL (ref 3.87–5.11)
RDW: 14 % (ref 11.5–15.5)
WBC Count: 6.8 10*3/uL (ref 4.0–10.5)
nRBC: 0 % (ref 0.0–0.2)

## 2019-02-24 MED ORDER — IOHEXOL 300 MG/ML  SOLN
75.0000 mL | Freq: Once | INTRAMUSCULAR | Status: AC | PRN
  Administered 2019-02-24: 75 mL via INTRAVENOUS

## 2019-02-24 MED ORDER — SODIUM CHLORIDE (PF) 0.9 % IJ SOLN
INTRAMUSCULAR | Status: AC
  Filled 2019-02-24: qty 50

## 2019-02-24 NOTE — Telephone Encounter (Signed)
Medication refill request: Vitamin D Last AEX:  12/29/2018 SM Next AEX: 03/04/2020 Last MMG (if hormonal medication request): 02/09/2019 BIRADS 2 Benign Density B Refill authorized: Pending #12 with 3 refills if appropriate. Please advise.

## 2019-03-02 NOTE — Progress Notes (Signed)
[name]  [phone] Fax:(336) [phone]     ID: [name] DOB: 1945-10-01  MR#: [id number]  ZHY#:[id number]  Patient Care Team: Brianna Carol, MD as PCP - General (Internal Medicine) Brianna Booze, MD as PCP - Cardiology (Cardiology) [name], Brianna Dad, MD as Consulting Physician (Oncology) Brianna Skates, MD as Consulting Physician (General Surgery) Brianna Robertson, MD (Radiology) Brianna Duos, MD as Consulting Physician (Rheumatology) Brianna Pulse, MD (Urology) Brianna Brownie, MD as Consulting Physician (Dermatology) Brianna Mutter, MD as Consulting Physician (Ophthalmology) Brianna Sinning, MD as Consulting Physician (Physical Medicine and Rehabilitation) Brianna Norway, RN as Registered Nurse (Oncology) Brianna Salon, MD as Consulting Physician (Gynecology) Brianna Loh, MD (Dermatology) OTHER MD:   CHIEF COMPLAINT: Estrogen receptor positive breast cancer  CURRENT TREATMENT:  Anastrozole   INTERVAL HISTORY: [name] was seen today for follow-up of her estrogen receptor positive breast cancer.   She continues on the PALLAS trial, observation arm.  She has 1 more 103-monthvisit after which we can go yearly if we wish.  She continues on anastrozole. She continues to have vaginal dryness and hot flashes, but she says that its nothing that she can't deal with.   [name]'s last bone density screening on 01/30/2017, showed a T-score of -2.7, which is considered osteoporotic.    Since her last visit, she [facility] showing: breast density category B; no evidence of malignancy in either breast.  She also underwent chest CT on 02/24/2019, which showed no significant interval change since 11/2016.  Of note, she also underwent pap smear on 12/29/2018, which was negative.   REVIEW OF SYSTEMS: [name] being very careful regarding the  pandemic.  She pretty much stays inside all the time.  Her husband JSonia Sidedoes go out some more but when he comes in he is careful to take off his shoes and wash his clothes and so on.  She is having more neuropathy problems, particularly at night when she gets into bed.  This is stopped this related to her earlier cancer treatment.  She is having some arthritis pains here and there as well.  She has not had any pain in one area that is very persistent and in general activity helps.  A detailed review of systems today was otherwise stable   BREAST CANCER HISTORY: From the original intake note:  LSamanthhad routine mammographic screening [facility] showing a right breast mass and possible axillary adenopathy. On 08/23/2015 she underwent bilateral diagnostic mammography with tomography and bilateral breast ultrasonography. The breast density was category B in the right breast there was a spiculated mass in the upper-outer quadrant measuring 1.2 cm and associated with pleomorphic calcifications. There were calcifications extending anteriorly and posteriorly from the mass worrisome for ductal carcinoma in situ. The total area in question is about 3 cm. There were also multiple enlarged right axillary lymph nodes. The mass was palpated as an area of fullness at the 12:00 position of the breast 3 cm from the nipple. There were palpable enlarged right axillary lymph nodes in the right axilla. Ultrasonography of the right breast confirmed an irregular hypoechoic mass in the area in question measuring 1.6 cm. There were multiple abnormal appearing enlarged right axillary lymph nodes, the largest measuring 2.3 cm.  In the left breast, mammography showed an area of architectural distortion associated with the left nipple. This was not palpable. Ultrasonography of  the left breast found no correlate. This area of the left breast was biopsied under tomography 08/24/2015. This showed ([name]) a  complex sclerosing lesion and fibroadenoma.  On 08/23/2015 the patient underwent biopsy of the right breast mass in 1 of the abnormal right axillary lymph nodes. 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 Both biopsies were HER-2 nonamplified, the breast being ratio 1.27 and number per cell 1.90, and the lymph node 1 ratio 1.24 and number per cell 3.0.  On 08/26/2015 the patient underwent bilateral breast MRI. In the left breast there was no suspicious enhancement. The left axilla was benign. In the right breast there was a mass described as in the upper inner quadrant measuring 2.3 cm. Less than a centimeter away from this mass there was a satellite nodule measuring 0.9 cm. In the right axilla there were multiple enlarged right axillary lymph nodes, including a confluent region measuring up to 4 cm. There were also enlarged level II lymph nodes present measuring up to 2.1 cm.  The patient's subsequent history is as detailed below   PAST MEDICAL HISTORY: Past Medical History:  Diagnosis Date   Anxiety    Arthritis 2008   Asthma 1953   age 12   Breast cancer ([place])    Cancer ([place])    breast cancer   Chronic vaginitis 1998   Fibromyalgia 1982   GERD (gastroesophageal reflux disease)    since at least 2011   Hemorrhoids 2009   History of radiation therapy 01/18/16-03/05/16   right breast/nodal areas 45 Gy, right breast boost 16 Gy   Hypoglycemia 2003   Hypothyroidism 1964   age 55   Interstitial cystitis 1998   Irritable bowel syndrome 06/14/2009   Qualifier: Diagnosis of  By: Patsy Baltimore RN, Denise     Menopausal symptoms    Osteoporosis 12/24/2006   Personal history of chemotherapy    Personal history of radiation therapy    Pneumonia 1979   varicella   PONV (postoperative nausea and vomiting) 1979    PAST  SURGICAL HISTORY: Past Surgical History:  Procedure Laterality Date   BLADDER HYPEREXTENSION     X4, prior to 01/2006   [place] LUMPECTOMY Right    2017   BREAST LUMPECTOMY WITH NEEDLE LOCALIZATION AND AXILLARY LYMPH NODE DISSECTION Right 12/05/2015   Procedure: RIGHT BREAST NEEDLE LOCALIZED (WIRES X 2) LUMPECTOMY WITH AXILLARY LYMPH NODE DISSECTION;  Surgeon: Brianna Skates, MD;  Location: [place] [facility];  Service: General;  Laterality: Right;   BREAST LUMPECTOMY WITH RADIOACTIVE SEED LOCALIZATION Left 12/05/2015   Procedure: RADIOACTIVE SEED GUIDED LEFT BREAST LUMPECTOMY;  Surgeon: Brianna Skates, MD;  Location: [facility];  Service: General;  Laterality: Left;   BREAST SURGERY     BUNIONECTOMY     prior to 01/2006   [place]  02/08/2006   DR. INGRAM   COLONOSCOPY     CORONARY STENT INTERVENTION N/A 01/01/2017   Procedure: CORONARY STENT INTERVENTION;  Surgeon: Brianna Booze, MD;  Location: [facility];  Service: Cardiovascular;  Laterality: N/A;   DENTAL SURGERY  2009-2016   13 dental implants   LEFT HEART CATH AND CORONARY ANGIOGRAPHY N/A 01/01/2017   Procedure: LEFT HEART CATH AND CORONARY ANGIOGRAPHY;  Surgeon: Brianna Booze, MD;  Location: [facility];  Service: Cardiovascular;  Laterality: N/A;   LEG SURGERY  07/06/2004   Broken tibia   NASAL SINUS SURGERY     X4; prior to 01/2006   [facility] REMOVAL Right 12/05/2015   Procedure: REMOVAL PORT-A-CATH;  Surgeon: Brianna Skates, MD;  Location: [facility];  Service: General;  Laterality: Right;   PORTACATH PLACEMENT Left 09/08/2015   Procedure: INSERTION OF PORT-A-CATH ;  Surgeon: Brianna Skates, MD;  Location: [facility];  Service: General;  Laterality: Left;    FAMILY HISTORY Family History  Problem Relation Age of Onset   Hypertension Mother    Heart disease Mother        d. 49   Parkinsonism Father          d. 22   Heart disease Sister    Breast cancer Sister 29       s/p mastectomy; reportedly negative BRCA1/2 testing approx 7 years ago   Melanoma Sister        d. 73s; hx of incresed sun exposure   Cancer Sister        dx. sarcoma of her face in her 36s; treated w/ MOHS surgery   Parkinsonism Paternal Aunt        d. 63s   Parkinsonism Paternal Uncle        d. late 82s   Kidney failure Maternal Grandmother 46   Heart attack Maternal Grandfather        d. 73s   Heart disease Paternal Grandmother        d. 77   Breast cancer Other 56       maternal great aunt (MGF's sister)   Kidney cancer Paternal Uncle 68       d. 81s; unsure about smoking status   The patient's father died at age 12 from complications of Parkinson's disease. The patient's mother died age 59 with heart disease. Kielyn had no brothers. She had 2 sisters. One sister was diagnosed with breast cancer at age 45. She was tested for the BRCA gene and was negative. The same sister also had melanoma diagnosed in her 34s. The patient has one maternal great aunt diagnosed with breast cancer around age 57. There is no history of ovarian cancer in the family   GYNECOLOGIC HISTORY:  Patient's last menstrual period was 04/30/1996 (approximate). Menarche age 85, first live birth age 51. The patient is GX P2. She stopped having periods in her early 40s and took hormone replacement until her breast cancer diagnosis, April 2017.   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 He has been in remission for more than 10 years. Their daughter Nira Conn lives in [place] were she is Garment/textile technologist of the [employer]. Daughter Belenda Cruise is an infectious disease M.D. working and teaching at [employer] in [place]. The patient has  2 grandchildren. The family attends [employer]: Not in place   HEALTH MAINTENANCE: Social History   Tobacco Use   Smoking status: Former Smoker    Packs/day: 0.25    Years: 15.00    Pack years: 3.75    Types: Cigarettes   Smokeless tobacco: Never Used   Tobacco comment: social smoker/"on and off"  Substance Use Topics   Alcohol  use: Yes    Alcohol/week: 0.0 standard drinks    Comment: social   Drug use: No     Colonoscopy: 2016/ [facility]  PAP: 11/2018, negative  Bone density: October 2018, T-score: -2.7  Lipid panel:  Allergies  Allergen Reactions   Chlorhexidine Other (See Comments)    [place] IS OK. The CLOTH WIPE causes reaction. Causes itching all over the area   Other Rash    Adhesive tape    Current Outpatient Medications  Medication Sig Dispense Refill   albuterol (PROVENTIL HFA;VENTOLIN HFA) 108 (90 Base) MCG/ACT inhaler Inhale 1-2 puffs into the lungs every 6 (six) hours as needed for wheezing or shortness of breath. Started in 1998     anastrozole (ARIMIDEX) 1 MG tablet Take 1 tablet (1 mg total) by mouth daily. 90 tablet 4   BRILINTA 90 MG TABS tablet TAKE 1 TABLET(90 MG) BY MOUTH TWICE DAILY 60 tablet 2   buPROPion (WELLBUTRIN XL) 300 MG 24 hr tablet Take 300 mg by mouth daily.      Calcium Carbonate-Vitamin D (CALCIUM + D PO) Take 1 tablet by mouth at bedtime. Started approximately 1998     carisoprodol (SOMA) 350 MG tablet Take 350 mg by mouth 2 (two) times daily as needed for muscle spasms. Started in 2008.  2   cycloSPORINE (RESTASIS) 0.05 % ophthalmic emulsion Place 1 drop into both eyes 2 (two) times daily. 30 mL 12   diazepam (VALIUM) 5 MG tablet Take 5 mg by mouth every 6 (six) hours as needed for muscle spasms (interstitial cystitis). Started approximately 2008.     fluconazole (DIFLUCAN) 100 MG tablet Take 1 tablet (100 mg total) by mouth daily. 30 tablet 0   furosemide (LASIX) 20 MG tablet TAKE 1 TABLET(20 MG)  BY MOUTH DAILY AS NEEDED FOR SHORTNESS OF BREATH 30 tablet 6   gabapentin (NEURONTIN) 100 MG capsule Take 1 capsule (100 mg total) by mouth at bedtime. 90 capsule 4   HYDROcodone-acetaminophen (NORCO) 10-325 MG tablet Take 1 tablet by mouth every 4 (four) hours as needed for moderate pain or severe pain (as needed for fibromyalgia and IC).      hydrOXYzine (ATARAX/VISTARIL) 25 MG tablet Take 1 tablet by mouth at bedtime.     levothyroxine (SYNTHROID, LEVOTHROID) 75 MCG tablet Take 75 mcg by mouth daily.  2   LORazepam (ATIVAN) 2 MG tablet Take 1 tablet by mouth daily as needed for anxiety.   5   minoxidil (ROGAINE) 2 % external solution Apply topically 2 (two) times daily.     nitroGLYCERIN (NITROSTAT) 0.4 MG SL tablet PLACE 1 TABLET UNDER TONGUE EVERY FIVE MINUTES AS NEEDED FOR CHEST PAIN 25 tablet 3   NONFORMULARY OR COMPOUNDED ITEM Vit E vaginal suppositories 249m/ml.  One pv three times weekly.  #36/4RF 36 each 4   pantoprazole (PROTONIX) 40 MG tablet Take 40 mg by mouth daily. Started in 2012.     potassium chloride (K-DUR) 10 MEQ tablet TAKE 1 TABLET BY MOUTH AS NEEDED. TAKE WITH LASIX 30 tablet 11   prochlorperazine (COMPAZINE) 10 MG tablet TAKE 1 TABLET(10 MG) BY MOUTH EVERY 6 HOURS AS NEEDED FOR NAUSEA OR VOMITING 385 tablet 1   rosuvastatin (CRESTOR) 20 MG tablet TAKE 1 TABLET(20 MG) BY MOUTH DAILY 90 tablet 3   Vaginal Lubricant (REPLENS VA) Place vaginally daily.     Vitamin D, Ergocalciferol, (DRISDOL) 1.25 MG (50000 UT) CAPS capsule TAKE 1 CAPSULE BY MOUTH EVERY 7 DAYS 12 capsule 3  zolpidem (AMBIEN) 10 MG tablet Take 10 mg by mouth at bedtime as needed for sleep.      No current facility-administered medications for this visit.      OBJECTIVE: Middle-aged white woman in no acute distress  Vitals:   03/03/19 1519  BP: 133/79  [name]: 72  Resp: 18  Temp: 98.2 F (36.8 C)  SpO2: 97%     Body mass index is 27.58 kg/m.    ECOG FS:1 - Symptomatic but  completely ambulatory  Wearing a wig Sclerae unicteric, EOMs intact Wearing a mask No cervical or supraclavicular adenopathy Lungs no rales or rhonchi Heart regular rate and rhythm Abd soft, nontender, positive bowel sounds MSK no focal spinal tenderness, no upper extremity lymphedema Neuro: nonfocal, well oriented, appropriate affect Breasts: The right breast is status post lumpectomy and radiation.  There is still a little bit of erythema but it is less than before and a little better demarcated.  It is not palpable.  The left breast is benign.  Both axillae are benign.  Right breast 08/15/2016     Right Breast 02/03/2018      LAB RESULTS:  CMP     Component Value Date/Time   NA 139 02/24/2019 1224   NA 138 03/11/2017 1524   K 4.3 02/24/2019 1224   K 3.8 03/11/2017 1524   CL 105 02/24/2019 1224   CO2 22 02/24/2019 1224   CO2 25 03/11/2017 1524   GLUCOSE 97 02/24/2019 1224   GLUCOSE 136 03/11/2017 1524   BUN 13 02/24/2019 1224   BUN 13.2 03/11/2017 1524   CREATININE 0.83 02/24/2019 1224   CREATININE 0.8 03/11/2017 1524   CALCIUM 9.1 02/24/2019 1224   CALCIUM 9.6 03/11/2017 1524   PROT 7.1 02/24/2019 1224   PROT 7.6 03/11/2017 1524   ALBUMIN 3.9 02/24/2019 1224   ALBUMIN 3.8 03/11/2017 1524   AST 19 02/24/2019 1224   AST 24 03/11/2017 1524   ALT 15 02/24/2019 1224   ALT 38 03/11/2017 1524   ALKPHOS 83 02/24/2019 1224   ALKPHOS 166 (H) 03/11/2017 1524   BILITOT 0.5 02/24/2019 1224   BILITOT 0.44 03/11/2017 1524   GFRNONAA >60 02/24/2019 1224   GFRAA >60 02/24/2019 1224    INo results found for: SPEP, UPEP  Lab Results  Component Value Date   WBC 6.8 02/24/2019   NEUTROABS 4.5 02/24/2019   HGB 13.5 02/24/2019   HCT 40.1 02/24/2019   MCV 94.8 02/24/2019   PLT 258 02/24/2019      Chemistry      Component Value Date/Time   NA 139 02/24/2019 1224   NA 138 03/11/2017 1524   K 4.3 02/24/2019 1224   K 3.8 03/11/2017 1524   CL 105 02/24/2019 1224    CO2 22 02/24/2019 1224   CO2 25 03/11/2017 1524   BUN 13 02/24/2019 1224   BUN 13.2 03/11/2017 1524   CREATININE 0.83 02/24/2019 1224   CREATININE 0.8 03/11/2017 1524      Component Value Date/Time   CALCIUM 9.1 02/24/2019 1224   CALCIUM 9.6 03/11/2017 1524   ALKPHOS 83 02/24/2019 1224   ALKPHOS 166 (H) 03/11/2017 1524   AST 19 02/24/2019 1224   AST 24 03/11/2017 1524   ALT 15 02/24/2019 1224   ALT 38 03/11/2017 1524   BILITOT 0.5 02/24/2019 1224   BILITOT 0.44 03/11/2017 1524       No results found for: LABCA2  No components found for: [id number]  No results for input(s): INR  in the last 168 hours.  Urinalysis    Component Value Date/Time   COLORURINE YELLOW 01/03/2017 1205   APPEARANCEUR HAZY (A) 01/03/2017 1205   LABSPEC 1.006 01/03/2017 1205   PHURINE 7.0 01/03/2017 1205   GLUCOSEU NEGATIVE 01/03/2017 1205   HGBUR SMALL (A) 01/03/2017 1205   BILIRUBINUR NEGATIVE 01/03/2017 1205   BILIRUBINUR n 10/01/2016 1316   KETONESUR NEGATIVE 01/03/2017 1205   PROTEINUR NEGATIVE 01/03/2017 1205   UROBILINOGEN 0.2 10/01/2016 1316   NITRITE NEGATIVE 01/03/2017 1205   LEUKOCYTESUR LARGE (A) 01/03/2017 1205      ELIGIBLE FOR AVAILABLE RESEARCH PROTOCOL: PALLAS   STUDIES: Ct Chest W Contrast  Result Date: 02/24/2019 CLINICAL DATA:  Breast cancer. Suspect metastatic disease. EXAM: CT CHEST WITH CONTRAST TECHNIQUE: Multidetector CT imaging of the chest was performed during intravenous contrast administration. CONTRAST:  58m OMNIPAQUE IOHEXOL 300 MG/ML  SOLN COMPARISON:  01/24/2018 FINDINGS: Cardiovascular: The heart size is normal. Aortic atherosclerosis. Three vessel coronary artery atherosclerotic calcifications. No pericardial effusion identified. Mediastinum/Nodes: No enlarged mediastinal, hilar, or axillary lymph nodes. Thyroid gland, trachea, and esophagus demonstrate no significant findings. Lungs/Pleura: No pleural effusion. No airspace consolidation, atelectasis or  pneumothorax identified. Scattered small ground-glass nodules are noted in both lungs, many of which have a cavitary appearance. The index nodule within the left upper lobe appears unchanged measuring 3 mm, image 42/7. 3 mm anterolateral right upper lobe lung nodule is identified, image 37/7. Unchanged. Ground-glass attenuating nodule within the posterolateral right lower lobe measures 3 mm, image 124/7. Unchanged. No new suspicious solid nodule identified. Upper Abdomen: No acute abnormality. Musculoskeletal: Previous right axillary nodal dissection. Post treatment changes within the right breast with overlying skin thickening is again noted, similar to prior exam. Thoracolumbar scoliosis and degenerative disc disease. No aggressive lytic or sclerotic bone lesions identified. IMPRESSION: 1. No significant interval change from 11/28/2016. 2. Unchanged appearance of diffuse bilateral ground-glass micro nodularity throughout both lungs. Favor sequelae of inflammatory or infectious process. 3. Similar post surgical and therapeutic changes involving the right breast and axilla Aortic Atherosclerosis (ICD10-I70.0). Electronically Signed   By: TKerby MoorsM.D.   On: 02/24/2019 16:35   Mm Diag Breast Tomo Bilateral  Result Date: 02/09/2019 CLINICAL DATA:  History of RIGHT lumpectomy with radiation treatment and chemotherapy 2017. Benign excisional biopsy of the LEFT breast. EXAM: DIGITAL DIAGNOSTIC BILATERAL MAMMOGRAM WITH CAD AND TOMO COMPARISON:  01/31/2018 and earlier ACR Breast Density Category b: There are scattered areas of fibroglandular density. FINDINGS: Postoperative changes are identified in both breasts. No suspicious mass, distortion, or microcalcifications are identified to suggest presence of malignancy. Mammographic images were processed with CAD. IMPRESSION: No mammographic evidence for malignancy. RECOMMENDATION: Diagnostic mammogram is suggested in 1 year. (Code:[id number]-B-01Y) I have discussed the  findings and recommendations with the patient. If applicable, a reminder letter will be sent to the patient regarding the next appointment. BI-RADS CATEGORY  2: Benign. Electronically Signed   By: ENolon NationsM.D.   On: 02/09/2019 14:24    ASSESSMENT: 73y.o. [place] woman status post right breast upper outer quadrant and right axillary lymph node biopsy 08/23/2015, both positive for a clinical T1 N2, stage IIIA invasive ductal carcinoma, grade 2, strongly estrogen receptor positive, 5% progesterone receptor positive, with an MIB-120-30% and no HER-2 amplification  (a) biopsy of a 0.9 cm Right breast satellite nodule 09/13/2015 showed invasive ductal carcinoma, grade 2, E-cadherin strongly positive, with a prognostic panel identical to the larger right upper quadrant mass  (1) left breast  biopsy 08/24/2015 shows a complex sclerosing lesion, excised 12/05/2015  (2) neoadjuvant chemotherapy consisting of cyclophosphamide and docetaxel every 21 days 4, with OnPro support, started 09/12/2015, completed 11/14/2015  (3) status post right lumpectomy and sentinel lymph node dissection 12/05/2015 for a residual pT2 pN3, stage IIIC invasive ductal carcinoma, grade 2, with a close anterior margin (skin)  (4) adjuvant radiation 01/18/16 - 11/6/17with capecitabine chemo-sensitization   1) Right breast/nodal areas: 45 Gy in 25 fractions                         2) Right breast boost: 16 Gy in 8 fractions  (5) capecitabine started 04/13/2016, discontinued January 2018 with mild/moderate [name] effects developing  (6) anastrozole started 06/01/2016  (a) bone density January 30, 2017 shows a T score of -2.7 (osteoporosis)  (7) genetics testing (Askenazi descent) 02/07/2016 through the [place] offered by [facility] Hope Pigeon, MD) i found no deleterious mutations in  ATM, BARD1, BRCA1, BRCA2, BRIP1, CDH1, CHEK2, FANCC, MLH1, MSH2, MSH6, NBN, PALB2, PMS2, PTEN, RAD51C, RAD51D,  TP53, and XRCC2.  This panel also includes deletion/duplication analysis (without sequencing) for one gene, EPCAM.  (8) consented to PALLAS trial   (a) randomized to anastrozole alone   PLAN: Shealyn is now just over 3 years out from definitive surgery for her breast cancer with no evidence of disease recurrence.  This is very favorable.  We reviewed pandemic precautions.  She did a lovely painting for one of her children and I showed me a picture of it.  She has a good exercise program.  I think she would do even better if she had a little bit more control of the neuropathy.  We are going to try gabapentin 100 mg at bedtime.  If that does not make her sleepy in the morning and it does help her sleep we can consider upping the dose carefully as needed.  She will let me know how that works.  Otherwise she will see me again in April of next year.  She knows to call for any other issues that may develop before that visit.   Jerine Surles, Brianna Dad, MD  03/03/19 3:59 PM Medical Oncology and Hematology [facility] [place], [place] [address] Tel. [phone]    Fax. [phone]   I, Wilburn Mylar, am acting as scribe for Dr. Virgie [name]. Dorma Altman.  I, Lurline Del MD, have reviewed the above documentation for accuracy and completeness, and I agree with the above.

## 2019-03-03 ENCOUNTER — Other Ambulatory Visit: Payer: Medicare Other

## 2019-03-03 ENCOUNTER — Inpatient Hospital Stay: Payer: Medicare Other | Attending: Oncology | Admitting: Oncology

## 2019-03-03 ENCOUNTER — Other Ambulatory Visit: Payer: Self-pay

## 2019-03-03 ENCOUNTER — Encounter: Payer: Self-pay | Admitting: *Deleted

## 2019-03-03 VITALS — BP 133/79 | HR 72 | Temp 98.2°F | Resp 18 | Ht 62.0 in | Wt 150.8 lb

## 2019-03-03 DIAGNOSIS — Z87891 Personal history of nicotine dependence: Secondary | ICD-10-CM | POA: Insufficient documentation

## 2019-03-03 DIAGNOSIS — M810 Age-related osteoporosis without current pathological fracture: Secondary | ICD-10-CM | POA: Insufficient documentation

## 2019-03-03 DIAGNOSIS — Z170 Estrogen receptor positive status [ER+]: Secondary | ICD-10-CM

## 2019-03-03 DIAGNOSIS — C50411 Malignant neoplasm of upper-outer quadrant of right female breast: Secondary | ICD-10-CM | POA: Diagnosis present

## 2019-03-03 DIAGNOSIS — G629 Polyneuropathy, unspecified: Secondary | ICD-10-CM | POA: Diagnosis not present

## 2019-03-03 DIAGNOSIS — I2510 Atherosclerotic heart disease of native coronary artery without angina pectoris: Secondary | ICD-10-CM | POA: Diagnosis not present

## 2019-03-03 DIAGNOSIS — C773 Secondary and unspecified malignant neoplasm of axilla and upper limb lymph nodes: Secondary | ICD-10-CM | POA: Insufficient documentation

## 2019-03-03 MED ORDER — FLUCONAZOLE 100 MG PO TABS: 100.0000 mg | ORAL_TABLET | Freq: Every day | ORAL | 0 refills | Status: DC

## 2019-03-03 MED ORDER — GABAPENTIN 100 MG PO CAPS: 100.0000 mg | ORAL_CAPSULE | Freq: Every day | ORAL | 4 refills | Status: DC

## 2019-03-03 NOTE — Research (Signed)
03/03/2019 Research - [facility] AFT-05 PALLAS - Follow-Up Phase I - interim visit Patient in to clinic today unaccompanied for interim visit. Per MD and patient preference due to the COVID-19 pandemic, clinical follow-up scans were deferred from June until the fall; study EOT visit was conducted virtually in April. Discussed with patient that the next clinic visit is expected in late March/early April 2021 and from that point would occur annually with telephone call visits from the research nurse at the six-month intervals in between. Of course, this frequency can be increased at the patient or physician preference and based on patient needs. Thanked patient again for her participation in the research study. Cindy S. Brigitte Pulse BSN, RN, [place] 03/03/2019 3:45 PM

## 2019-03-25 ENCOUNTER — Other Ambulatory Visit: Payer: Self-pay | Admitting: *Deleted

## 2019-03-25 ENCOUNTER — Telehealth: Payer: Self-pay | Admitting: *Deleted

## 2019-03-25 MED ORDER — GABAPENTIN 100 MG PO CAPS: 100.0000 mg | ORAL_CAPSULE | Freq: Two times a day (BID) | ORAL | 0 refills | Status: DC

## 2019-03-25 NOTE — Telephone Encounter (Signed)
Received call from pt stating she has increased neuropathy in her fingers and toes.  Pt currently taking 100 mg of gabapentin in the morning and 200 mg before bed.  Per Dr. Jana Hakim, pt to take 200 mg of gabapentin in the morning and 300 mg of gabapentin before bed.  Updated prescription sent to pharmacy on file. Pt educated on increase of mediation and to call the office next week if symptoms resolve.  Pt verbalized understanding.

## 2019-04-10 ENCOUNTER — Telehealth: Payer: Self-pay | Admitting: *Deleted

## 2019-04-10 MED ORDER — GABAPENTIN 100 MG PO CAPS: ORAL_CAPSULE | ORAL | 1 refills | Status: DC

## 2019-04-10 MED ORDER — GABAPENTIN 300 MG PO CAPS: 300.0000 mg | ORAL_CAPSULE | Freq: Three times a day (TID) | ORAL | 3 refills | Status: DC

## 2019-04-10 NOTE — Telephone Encounter (Signed)
This RN spoke with pt per her call stating noted improvement with slow titration of gabapentin.  She is currently taking 300 mg in the morning and 400 mg q hs.  She states she sees benefit but still has breakthrough discomfort - primarily more at night.  This RN discussed above with goal of titration of 1200 mg a day which may be best dosed three times a day.  [name] will increase her pm dose to 500 mg then after 3 days add a 100mg  mid day and then every 3 days add a 100 mg dose and monitor for benefit with goal of max dose of 1200 mg daily.  Of note [name] is very pleased with the titration due to " years ago I was put on a higher dose of this medication for my fibromyalgia and had terrible side effects that I quit using it "  Refill discussed including [name] inquiring about a higher dose for less pill swallowing.  New prescriptions sent to verified pharmacy.

## 2019-04-16 ENCOUNTER — Telehealth: Payer: Self-pay | Admitting: Radiology

## 2019-04-16 NOTE — Telephone Encounter (Signed)
Patient is calling states that she is having a lot of sciatic pain and she would like to schedule an injection--please advise

## 2019-04-16 NOTE — Telephone Encounter (Signed)
I called and scheduled for 05/11/2019 @ 1:30pm

## 2019-04-16 NOTE — Telephone Encounter (Signed)
Left L4 TF esi, would eval at same time, she has had in past.

## 2019-04-25 ENCOUNTER — Other Ambulatory Visit: Payer: Self-pay | Admitting: Oncology

## 2019-04-25 ENCOUNTER — Other Ambulatory Visit: Payer: Self-pay | Admitting: Cardiology

## 2019-05-11 ENCOUNTER — Ambulatory Visit: Payer: Self-pay

## 2019-05-11 ENCOUNTER — Encounter: Payer: Self-pay | Admitting: Physical Medicine and Rehabilitation

## 2019-05-11 ENCOUNTER — Other Ambulatory Visit: Payer: Self-pay

## 2019-05-11 ENCOUNTER — Ambulatory Visit (INDEPENDENT_AMBULATORY_CARE_PROVIDER_SITE_OTHER): Payer: Medicare Other | Admitting: Physical Medicine and Rehabilitation

## 2019-05-11 VITALS — BP 146/87 | HR 76

## 2019-05-11 DIAGNOSIS — M797 Fibromyalgia: Secondary | ICD-10-CM

## 2019-05-11 DIAGNOSIS — M25561 Pain in right knee: Secondary | ICD-10-CM | POA: Diagnosis not present

## 2019-05-11 DIAGNOSIS — G8929 Other chronic pain: Secondary | ICD-10-CM

## 2019-05-11 DIAGNOSIS — M5416 Radiculopathy, lumbar region: Secondary | ICD-10-CM

## 2019-05-11 DIAGNOSIS — M48061 Spinal stenosis, lumbar region without neurogenic claudication: Secondary | ICD-10-CM

## 2019-05-11 DIAGNOSIS — G894 Chronic pain syndrome: Secondary | ICD-10-CM | POA: Diagnosis not present

## 2019-05-11 DIAGNOSIS — M7918 Myalgia, other site: Secondary | ICD-10-CM | POA: Diagnosis not present

## 2019-05-11 DIAGNOSIS — M25562 Pain in left knee: Secondary | ICD-10-CM

## 2019-05-11 MED ORDER — METHYLPREDNISOLONE ACETATE 80 MG/ML IJ SUSP
40.0000 mg | Freq: Once | INTRAMUSCULAR | Status: AC
  Administered 2019-05-11: 40 mg

## 2019-05-11 NOTE — Progress Notes (Signed)
 .  Numeric Pain Rating Scale and Functional Assessment Average Pain 6   In the last MONTH (on 0-10 scale) has pain interfered with the following?  1. General activity like being  able to carry out your everyday physical activities such as walking, climbing stairs, carrying groceries, or moving a chair?  Rating(6)   +Driver, -BT, -Dye Allergies.  

## 2019-05-12 ENCOUNTER — Encounter: Payer: Self-pay | Admitting: Physical Medicine and Rehabilitation

## 2019-05-12 NOTE — Procedures (Signed)
Lumbosacral Transforaminal Epidural Steroid Injection - Sub-Pedicular Approach with Fluoroscopic Guidance  Patient: [name]      Date of Birth: [birth year] MRN: [id number] PCP: Seward Carol, MD      Visit Date: 05/11/2019   Universal Protocol:    Date/Time: 05/11/2019  Consent Given By: the patient  Position: PRONE  Additional Comments: Vital signs were monitored before and after the procedure. Patient was prepped and draped in the usual sterile fashion. The correct patient, procedure, and site was verified.   Injection Procedure Details:  Procedure Site One Meds Administered:  Meds ordered this encounter  Medications  . methylPREDNISolone acetate (DEPO-MEDROL) injection 40 mg    Laterality: Bilateral  Location/Site:  L4-L5  Needle size: 22 G  Needle type: Spinal  Needle Placement: Transforaminal  Findings:    -Comments: Excellent flow of contrast along the nerve and into the epidural space.  Procedure Details: After squaring off the end-plates to get a true AP view, the C-arm was positioned so that an oblique view of the foramen as noted above was visualized. The target area is just inferior to the "nose of the scotty dog" or sub pedicular. The soft tissues overlying this structure were infiltrated with 2-3 ml. of 1% Lidocaine without Epinephrine.  The spinal needle was inserted toward the target using a "trajectory" view along the fluoroscope beam.  Under AP and lateral visualization, the needle was advanced so it did not puncture dura and was located close the 6 O'Clock position of the pedical in AP tracterory. Biplanar projections were used to confirm position. Aspiration was confirmed to be negative for CSF and/or blood. A 1-2 ml. volume of Isovue-250 was injected and flow of contrast was noted at each level. Radiographs were obtained for documentation purposes.   After attaining the desired flow of contrast documented above, a 0.5 to 1.0 ml test dose of  0.25% Marcaine was injected into each respective transforaminal space.  The patient was observed for 90 seconds post injection.  After no sensory deficits were reported, and normal lower extremity motor function was noted,   the above injectate was administered so that equal amounts of the injectate were placed at each foramen (level) into the transforaminal epidural space.   Additional Comments:  The patient tolerated the procedure well Dressing: 2 x 2 sterile gauze and Band-Aid    Post-procedure details: Patient was observed during the procedure. Post-procedure instructions were reviewed.  Patient left the clinic in stable condition.

## 2019-05-12 NOTE — Progress Notes (Signed)
[name] - 74 y.o. female MRN [id number]  Date of birth: 1945/10/06  Office Visit Note: Visit Date: 05/11/2019 PCP: Seward Carol, MD Referred by: Seward Carol, MD  Subjective: Chief Complaint  Patient presents with  . Lower Back - Pain  . Right Thigh - Pain  . Left Thigh - Pain  . Left Knee - Pain  . Right Knee - Pain   HPI: [name] is a 74 y.o. female who comes in today For reevaluation management of low back pain and bilateral hip and thigh pain.  Patient is well-known to me but I have not seen her since 2018.  Since I have seen her she unfortunately has had cardiac issues with stent placement and NSTEMI.  She is currently on Brilinta for anticoagulation.  She comes in today stating for 2 to 3 months she has had worsening low back pain with radiating symptoms to the front of both thighs somewhat anterior laterally and into the knees.  Sometimes past the knee.  No paresthesias.  Symptoms are worse with going from a sitting position to standing and with prolonged walking.  She cannot really walk very long at all.  When she first stands up she gets pain around the anterior inferior aspect of the knees.  She has not endorsed any popping or clicking of the knees or swelling.  In the past she has been seen by Dr. Meridee Score in our office for her orthopedic complaints.  She has a long history of significant fibromyalgia.  She is taking 10 mg Vicodin at this point for her pain relief.  I did review the controlled substance database and she is taking this appropriately but she is on a small benzodiazepine as well.  She has not had recent physical therapy but has had multiple bouts of physical therapy in the past and does try to stay active.  She has basically a gym set up in her house that she can work out with.  Her case is further complicated by history of breast cancer status post chemotherapy with some diagnosed peripheral neuropathy.  MRI from 2015 showed mild lateral recess  narrowing at L4-5 with facet arthropathy but no high-grade stenosis or disc herniations.  Prior injection at L4 gave her great relief a few years ago for similar type complaints on the left side.  She has not noted any red flag complaints of bowel or bladder dysfunction or changes or fevers chills or night sweats.  Review of Systems  Constitutional: Negative for chills, fever, malaise/fatigue and weight loss.  HENT: Negative for hearing loss and sinus pain.   Eyes: Negative for blurred vision, double vision and photophobia.  Respiratory: Negative for cough and shortness of breath.   Cardiovascular: Negative for chest pain, palpitations and leg swelling.  Gastrointestinal: Negative for abdominal pain, nausea and vomiting.  Genitourinary: Negative for flank pain.  Musculoskeletal: Positive for back pain and joint pain. Negative for myalgias.  Skin: Negative for itching and rash.  Neurological: Negative for tremors, focal weakness and weakness.  Endo/Heme/Allergies: Negative.   Psychiatric/Behavioral: Negative for depression.  All other systems reviewed and are negative.  Otherwise per HPI.  Assessment & Plan: Visit Diagnoses:  1. Lumbar radiculopathy   2. Bilateral stenosis of lateral recess of lumbar spine   3. Fibromyalgia   4. Chronic pain syndrome   5. Myofascial pain syndrome   6. Chronic pain of both knees     Plan: Findings:  Chronic worsening low back pain  with referral pattern into the legs consistent with facet arthropathy and lateral recess narrowing with some overlay of fibromyalgia.  She does have chronic pain syndrome from the fibromyalgia.  She is currently using Vicodin with some high risk medication of benzodiazepine.  This is obviously symptoms were not going to challenge in our office but hopefully we can try to help her manage some pain in her back with intervention.  I think the best approach is an L4 transforaminal injection bilaterally.  She is currently on Brilinta  for anticoagulation and we can do that injection today even on that medication.  Because of increased pain symptoms we will do the injection today diagnostically and hopefully therapeutically.  She does not get much relief then we would look at MRI of the lumbar spine and may be regroup with physical therapy depending on her desire to do that during the pandemic.  Lastly she seems to be having some knee pain when she first gets up and on not sure that is related to the lumbar spine.  The injection will help diagnose that as well.  Brief exam today did not show anything significant with the knees other than there is crepitus and some small effusion with some joint line tenderness but good stability.  I would have her follow-up with Dr. Meridee Score should her knee pain continue.    Meds & Orders:  Meds ordered this encounter  Medications  . methylPREDNISolone acetate (DEPO-MEDROL) injection 40 mg    Orders Placed This Encounter  Procedures  . XR C-ARM NO REPORT  . Epidural Steroid injection    Follow-up: Return if symptoms worsen or fail to improve.   Procedures: No procedures performed  Lumbosacral Transforaminal Epidural Steroid Injection - Sub-Pedicular Approach with Fluoroscopic Guidance  Patient: [name]      Date of Birth: 01-08-1946 MRN: [id number] PCP: Seward Carol, MD      Visit Date: 05/11/2019   Universal Protocol:    Date/Time: 05/11/2019  Consent Given By: the patient  Position: PRONE  Additional Comments: Vital signs were monitored before and after the procedure. Patient was prepped and draped in the usual sterile fashion. The correct patient, procedure, and site was verified.   Injection Procedure Details:  Procedure Site One Meds Administered:  Meds ordered this encounter  Medications  . methylPREDNISolone acetate (DEPO-MEDROL) injection 40 mg    Laterality: Bilateral  Location/Site:  L4-L5  Needle size: 22 G  Needle type: Spinal  Needle  Placement: Transforaminal  Findings:    -Comments: Excellent flow of contrast along the nerve and into the epidural space.  Procedure Details: After squaring off the end-plates to get a true AP view, the C-arm was positioned so that an oblique view of the foramen as noted above was visualized. The target area is just inferior to the "nose of the scotty dog" or sub pedicular. The soft tissues overlying this structure were infiltrated with 2-3 ml. of 1% Lidocaine without Epinephrine.  The spinal needle was inserted toward the target using a "trajectory" view along the fluoroscope beam.  Under AP and lateral visualization, the needle was advanced so it did not puncture dura and was located close the 6 O'Clock position of the pedical in AP tracterory. Biplanar projections were used to confirm position. Aspiration was confirmed to be negative for CSF and/or blood. A 1-2 ml. volume of Isovue-250 was injected and flow of contrast was noted at each level. Radiographs were obtained for documentation purposes.   After attaining  the desired flow of contrast documented above, a 0.5 to 1.0 ml test dose of 0.25% Marcaine was injected into each respective transforaminal space.  The patient was observed for 90 seconds post injection.  After no sensory deficits were reported, and normal lower extremity motor function was noted,   the above injectate was administered so that equal amounts of the injectate were placed at each foramen (level) into the transforaminal epidural space.   Additional Comments:  The patient tolerated the procedure well Dressing: 2 x 2 sterile gauze and Band-Aid    Post-procedure details: Patient was observed during the procedure. Post-procedure instructions were reviewed.  Patient left the clinic in stable condition.      Clinical History: MRI LUMBAR SPINE WITHOUT CONTRAST SOS   TECHNIQUE: Multiplanar, multisequence MR imaging of the lumbar spine was performed. No intravenous  contrast was administered.   COMPARISON: None.   FINDINGS: The vertebral bodies of the lumbar spine are normal in size. The vertebral bodies of the lumbar spine are normal in alignment. Marrow heterogeneity is present. Although this can be caused by marrow infiltrative processes, the most common causes include anemia, smoking, obesity, or advancing age. There is degenerative disc disease with disc desiccation and disc height loss at L5-S1. There is disc desiccation throughout the remainder of the lumbar spine.   The spinal cord is normal in signal and contour. The cord terminates normally at L1 . The nerve roots of the cauda equina and the filum terminale are normal.   The visualized portions of the SI joints are unremarkable.   The imaged intra-abdominal contents are unremarkable.   T12-L1: No significant disc bulge. No evidence of neural foraminal stenosis. No central canal stenosis.   L1-L2: Mild broad-based disc bulge. No evidence of neural foraminal stenosis. No central canal stenosis.   L2-L3: Mild broad-based disc bulge. No evidence of neural foraminal stenosis. No central canal stenosis.   L3-L4: Mild broad-based disc bulge. No evidence of neural foraminal stenosis. No central canal stenosis.   L4-L5: Mild broad-based disc bulge. Mild bilateral lateral recess stenosis. No evidence of neural foraminal stenosis. No central canal stenosis. Mild bilateral facet arthropathy.   L5-S1: Mild broad-based disc bulge. Mild bilateral facet arthropathy. No evidence of neural foraminal stenosis. No central canal stenosis.   IMPRESSION: 1. Mild broad-based disc bulges throughout the lumbar spine. 2. Mild bilateral facet arthropathy with mild bilateral lateral recess stenosis at L4-5.     Electronically Signed  By: Kathreen Devoid  On: 01/29/2014 12:05   She reports that she has quit smoking. Her smoking use included cigarettes. She has a 3.75 pack-year smoking history. She has  never used smokeless tobacco. No results for input(s): HGBA1C, LABURIC in the last 8760 hours.  Objective:  VS:  HT:    WT:   BMI:     BP:(!) 146/87  HR:76bpm  TEMP: ( )  RESP:  Physical Exam Vitals and nursing note reviewed.  Constitutional:      General: She is not in acute distress.    Appearance: Normal appearance. She is well-developed. She is not ill-appearing.  HENT:     Head: Normocephalic and atraumatic.  Eyes:     Conjunctiva/sclera: Conjunctivae normal.     Pupils: Pupils are equal, round, and reactive to light.  Cardiovascular:     Rate and Rhythm: Normal rate.     Pulses: Normal pulses.  Pulmonary:     Effort: Pulmonary effort is normal.  Musculoskeletal:     Right lower  leg: No edema.     Left lower leg: No edema.     Comments: Patient is very slow to rise from a seated position with antalgic gait and standing just with first getting up.  Most of the pain is in the knees at that point.  She has pain with extension of the lumbar spine she has no focal trigger points but does have tender points across the lower spine PSIS and greater trochanters.  No hip pain with rotation.  She has good distal strength.  Examination of the knee shows good varus and valgus stability with a seemingly some minor effusion.  She has some joint line tenderness but is tender all over the knee.  Skin:    General: Skin is warm and dry.     Findings: No erythema or rash.  Neurological:     General: No focal deficit present.     Mental Status: She is alert and oriented to person, place, and time.     Sensory: No sensory deficit.     Motor: No weakness or abnormal muscle tone.     Coordination: Coordination normal.     Gait: Gait abnormal.  Psychiatric:        Mood and Affect: Mood normal.        Behavior: Behavior normal.     Ortho Exam Imaging: XR C-ARM NO REPORT  Result Date: 05/11/2019 Please see Notes tab for imaging impression.   Past Medical/Family/Surgical/Social  History: Medications & Allergies reviewed per EMR, new medications updated. Patient Active Problem List   Diagnosis Date Noted  . Lumbar radiculopathy 05/11/2019  . Bilateral stenosis of lateral recess of lumbar spine 05/11/2019  . Hyperlipidemia 12/23/2018  . Chronic diastolic CHF (congestive heart failure) ([place]) 11/24/2018  . Abnormal transaminases 03/24/2018  . CAD (coronary artery disease) 02/11/2017  . 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 Status post coronary artery stent placement   . Non-ST elevation (NSTEMI) myocardial infarction ([place])   . Depression with anxiety 12/31/2016  . Chest pain 12/31/2016  . Hyponatremia 12/31/2016  . Chronic pain 12/31/2016  . Essential hypertension 12/31/2016  . Unstable angina ([place])   . Family history of breast cancer in female 02/08/2016  . Rash and nonspecific skin eruption 10/03/2015  . 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 Malignant neoplasm of upper-outer quadrant of right breast in female, estrogen receptor positive ([place]) 09/01/2015  . 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 Asthma 09/12/2011  . Menopausal symptoms   . Chronic vaginitis   . Fibromyalgia   . Interstitial cystitis   . UNSPECIFIED HYPOTHYROIDISM 06/14/2009  . DEPRESSIVE DISORDER NOT ELSEWHERE CLASSIFIED 06/14/2009  . Hereditary and idiopathic peripheral neuropathy 06/14/2009  . UNSPEC HEMORRHOIDS WITHOUT MENTION COMPLICATION 48/25/0037  . ALLERGIC RHINITIS CAUSE UNSPECIFIED 06/14/2009  . ESOPHAGEAL REFLUX 06/14/2009  . Irritable bowel syndrome 06/14/2009  . INSOMNIA UNSPECIFIED 06/14/2009  . Osteoporosis 08/27/2007   Past Medical History:  Diagnosis Date  . Anxiety   . Arthritis 2008  . Asthma 1953   age 73  . Breast cancer ([place])   . Cancer [facility])    breast cancer  . Chronic vaginitis 1998  . Fibromyalgia 1982  . GERD (gastroesophageal reflux disease)    since at least 2011  . Hemorrhoids 2009  . History of radiation therapy  01/18/16-03/05/16   right breast/nodal areas 45 Gy, right breast boost 16 Gy  . Hypoglycemia 2003  . Hypothyroidism 1964   age 46  .  Interstitial cystitis 1998  . Irritable bowel syndrome 06/14/2009   Qualifier: Diagnosis of  By: Patsy Baltimore RN, Langley Gauss    . Menopausal symptoms   . Osteoporosis 12/24/2006  . Personal history of chemotherapy   . Personal history of radiation therapy   . Pneumonia 1979   varicella  . PONV (postoperative nausea and vomiting) 1979   Family History  Problem Relation Age of Onset  . Hypertension Mother   . Heart disease Mother        d. 23  . Parkinsonism Father        d. 20  . Heart disease Sister   . Breast cancer Sister 50       s/p mastectomy; reportedly negative BRCA1/2 testing approx 7 years ago  . Melanoma Sister        d. 1s; hx of incresed sun exposure  . Cancer Sister        dx. sarcoma of her face in her 11s; treated w/ MOHS surgery  . Parkinsonism Paternal Aunt        d. 90s  . Parkinsonism Paternal Uncle        d. late 25s  . Kidney failure Maternal Grandmother 28  . Heart attack Maternal Grandfather        d. 17s  . Heart disease Paternal Grandmother        d. 23  . Breast cancer Other 2       maternal great aunt (MGF's sister)  . Kidney cancer Paternal Uncle 84       d. 77s; unsure about smoking status   Past Surgical History:  Procedure Laterality Date  . BLADDER HYPEREXTENSION     X4, prior to 01/2006  . BLADDER SUSPENSION    . BREAST BIOPSY    . BREAST LUMPECTOMY Right    2017  . BREAST LUMPECTOMY WITH NEEDLE LOCALIZATION AND AXILLARY LYMPH NODE DISSECTION Right 12/05/2015   Procedure: RIGHT BREAST NEEDLE LOCALIZED (WIRES X 2) LUMPECTOMY WITH AXILLARY LYMPH NODE DISSECTION;  Surgeon: Fanny Skates, MD;  Location: [place];  Service: General;  Laterality: Right;  . BREAST LUMPECTOMY WITH RADIOACTIVE SEED LOCALIZATION Left 12/05/2015   Procedure: RADIOACTIVE SEED GUIDED LEFT BREAST LUMPECTOMY;  Surgeon: Fanny Skates, MD;   Location: [place];  Service: General;  Laterality: Left;  . BREAST SURGERY    . BUNIONECTOMY     prior to 01/2006  . CARDIAC CATHETERIZATION    . CESAREAN SECTION  1979  . CHOLECYSTECTOMY  02/08/2006   DR. INGRAM  . COLONOSCOPY    . CORONARY STENT INTERVENTION N/A 01/01/2017   Procedure: CORONARY STENT INTERVENTION;  Surgeon: Jettie Booze, MD;  Location: [place] [facility];  Service: Cardiovascular;  Laterality: N/A;  . DENTAL SURGERY  2009-2016   13 dental implants  . LEFT HEART CATH AND CORONARY ANGIOGRAPHY N/A 01/01/2017   Procedure: LEFT HEART CATH AND CORONARY ANGIOGRAPHY;  Surgeon: Jettie Booze, MD;  Location: [place] [facility];  Service: Cardiovascular;  Laterality: N/A;  . LEG SURGERY  07/06/2004   Broken tibia  . NASAL SINUS SURGERY     X4; prior to 01/2006  . PORT-A-CATH REMOVAL Right 12/05/2015   Procedure: REMOVAL PORT-A-CATH;  Surgeon: Fanny Skates, MD;  Location: [place];  Service: General;  Laterality: Right;  . PORTACATH PLACEMENT Left 09/08/2015   Procedure: INSERTION OF PORT-A-CATH ;  Surgeon: Fanny Skates, MD;  Location: [place];  Service: General;  Laterality: Left;   Social History   Occupational  History  . Not on file  Tobacco Use  . Smoking status: Former Smoker    Packs/day: 0.25    Years: 15.00    Pack years: 3.75    Types: Cigarettes  . Smokeless tobacco: Never Used  . Tobacco comment: social smoker/"on and off"  Substance and Sexual Activity  . Alcohol use: Yes    Alcohol/week: 0.0 standard drinks    Comment: social  . Drug use: No  . Sexual activity: Yes    Birth control/protection: Post-menopausal

## 2019-06-23 ENCOUNTER — Ambulatory Visit: Payer: Medicare Other

## 2019-07-30 ENCOUNTER — Other Ambulatory Visit: Payer: Self-pay | Admitting: Oncology

## 2019-07-30 DIAGNOSIS — Z170 Estrogen receptor positive status [ER+]: Secondary | ICD-10-CM

## 2019-07-30 DIAGNOSIS — C50411 Malignant neoplasm of upper-outer quadrant of right female breast: Secondary | ICD-10-CM

## 2019-08-02 NOTE — Progress Notes (Signed)
[name]  [phone] [phone]     ID: [name] DOB: 02/12/1946  MR#: [id number]  IDP#:[id number]  Patient Care Team: Seward Carol, MD as PCP - General (Internal Medicine) Jettie Booze, MD as PCP - Cardiology (Cardiology) Perris Conwell, Virgie Dad, MD as Consulting Physician (Oncology) Fanny Skates, MD as Consulting Physician (General Surgery) Luberta Robertson, MD (Radiology) Hennie Duos, MD as Consulting Physician (Rheumatology) Domingo Pulse, MD (Urology) Druscilla Brownie, MD as Consulting Physician (Dermatology) Luberta Mutter, MD as Consulting Physician (Ophthalmology) Magnus Sinning, MD as Consulting Physician (Physical Medicine and Rehabilitation) Benson Norway, RN as Registered Nurse (Oncology) Megan Salon, MD as Consulting Physician (Gynecology) Loney Loh, MD (Dermatology) OTHER MD:   CHIEF COMPLAINT: Estrogen receptor positive breast cancer  CURRENT TREATMENT:  Anastrozole   INTERVAL HISTORY: [name] was seen today for follow-up of her estrogen receptor positive breast cancer.  Her study nurse was also present.  [name] continues on the PALLAS trial, observation arm.  The study has fairly closed, this is her 73-monthvisit, and as far as the study goes she will need a visit in a year, but they have also added some questionnaires for your 6 and 7.  She continues on anastrozole.  She tolerates this with no significant [name] effect.  [name]'s last bone density screening on 01/30/2017, showed a T-score of -2.7, which is considered osteoporotic. She is scheduled for repeat study on 10/08/2019.  Her most recent mamm[facility]  Since her last visit, she underwent lumbosacral steroid injection on 05/11/2019 under Dr. NErnestina Patches  She does not feel these were helpful  She also received both doses of the Covid-19 vaccine-- on 05/22/2019 and 06/13/2019.  They tolerated that  well.   REVIEW OF SYSTEMS: LEmmersonis very concerned about her husband Brianna Sidewho has had a very rapid cognitive decline.  He is now incontinent as well as confused.  He is being followed by Dr. YEvelena Leydenin neurology and an LP is planned for tomorrow which hopefully will be helpful.  In addition they have lost all 3 of their pets.  There are beagles who were 139years old apparently died from strokes and their cat suddenly died from a heart attack.  They have a new beagle, [name], who looks extremely intelligent from the photo she showed [place]   BREAST CANCER HISTORY: From the original intake note:  [name] routine [facility] a right [facility] mass and possible axillary adenopathy. On 08/23/2015 she underwent bilateral diagnostic mammography with tomography and bilateral breast ultrasonography. The breast density was category B in the right breast there was a spiculated mass in the upper-outer quadrant measuring 1.2 cm and associated with pleomorphic calcifications. There were calcifications extending anteriorly and posteriorly from the mass worrisome for ductal carcinoma in situ. The total area in question is about 3 cm. There were also multiple enlarged right axillary lymph nodes. The mass was palpated as an area of fullness at the 12:00 position of the breast 3 cm from the nipple. There were palpable enlarged right axillary lymph nodes in the right axilla. Ultrasonography of the right breast confirmed an irregular hypoechoic mass in the area in question measuring 1.6 cm. There were multiple abnormal appearing enlarged right axillary lymph nodes, the largest measuring 2.3 cm.  In the left breast, mammography showed an area of architectural distortion associated with the left nipple. This was not palpable. Ultrasonography  of the left breast found no correlate. This area of the left breast was biopsied under tomography 08/24/2015. This showed (SAA [id number])  a complex sclerosing lesion and fibroadenoma.  On 08/23/2015 the patient underwent biopsy of the right breast mass in 1 of the abnormal right axillary lymph nodes. 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 Both biopsies were HER-2 nonamplified, the breast being ratio 1.27 and number per cell 1.90, and the lymph node 1 ratio 1.24 and number per cell 3.0.  On 08/26/2015 the patient underwent bilateral breast MRI. In the left breast there was no suspicious enhancement. The left axilla was benign. In the right breast there was a mass described as in the upper inner quadrant measuring 2.3 cm. Less than a centimeter away from this mass there was a satellite nodule measuring 0.9 cm. In the right axilla there were multiple enlarged right axillary lymph nodes, including a confluent region measuring up to 4 cm. There were also enlarged level II lymph nodes present measuring up to 2.1 cm.  The patient's subsequent history is as detailed below   PAST MEDICAL HISTORY: Past Medical History:  Diagnosis Date   Anxiety    Arthritis 2008   Asthma 1953   age 43   Breast cancer ([place])    Cancer ([place])    breast cancer   Chronic vaginitis 1998   Fibromyalgia 1982   GERD (gastroesophageal reflux disease)    since at least 2011   Hemorrhoids 2009   History of radiation therapy 01/18/16-03/05/16   right breast/nodal areas 45 Gy, right breast boost 16 Gy   Hypoglycemia 2003   Hypothyroidism 1964   age 42   Interstitial cystitis 1998   Irritable bowel syndrome 06/14/2009   Qualifier: Diagnosis of  By: Patsy Baltimore RN, Denise     Menopausal symptoms    Osteoporosis 12/24/2006   Personal history of chemotherapy    Personal history of radiation therapy    Pneumonia 1979   varicella   PONV (postoperative nausea and vomiting) 1979     PAST SURGICAL HISTORY: Past Surgical History:  Procedure Laterality Date   BLADDER HYPEREXTENSION     X4, prior to 01/2006   [place] LUMPECTOMY Right    2017   BREAST LUMPECTOMY WITH NEEDLE LOCALIZATION AND AXILLARY LYMPH NODE DISSECTION Right 12/05/2015   Procedure: RIGHT BREAST NEEDLE LOCALIZED (WIRES X 2) LUMPECTOMY WITH AXILLARY LYMPH NODE DISSECTION;  Surgeon: Fanny Skates, MD;  Location: [facility];  Service: General;  Laterality: Right;   BREAST LUMPECTOMY WITH RADIOACTIVE SEED LOCALIZATION Left 12/05/2015   Procedure: RADIOACTIVE SEED GUIDED LEFT BREAST LUMPECTOMY;  Surgeon: Fanny Skates, MD;  Location: [facility];  Service: General;  Laterality: Left;   BREAST SURGERY     BUNIONECTOMY     prior to 01/2006   [place]  02/08/2006   DR. INGRAM   COLONOSCOPY     CORONARY STENT INTERVENTION N/A 01/01/2017   Procedure: CORONARY STENT INTERVENTION;  Surgeon: Jettie Booze, MD;  Location: [facility];  Service: Cardiovascular;  Laterality: N/A;   DENTAL SURGERY  2009-2016   13 dental implants   LEFT HEART CATH AND CORONARY ANGIOGRAPHY N/A 01/01/2017   Procedure: LEFT HEART CATH AND CORONARY ANGIOGRAPHY;  Surgeon: Jettie Booze, MD;  Location: [facility];  Service: Cardiovascular;  Laterality: N/A;   LEG SURGERY  07/06/2004   Broken tibia   NASAL SINUS SURGERY     X4; prior to 01/2006   [facility] REMOVAL Right 12/05/2015   Procedure: REMOVAL PORT-A-CATH;  Surgeon: Fanny Skates, MD;  Location: [facility];  Service: General;  Laterality: Right;   PORTACATH PLACEMENT Left 09/08/2015   Procedure: INSERTION OF PORT-A-CATH ;  Surgeon: Fanny Skates, MD;  Location: [facility];  Service: General;  Laterality: Left;    FAMILY HISTORY Family History  Problem Relation Age of Onset   Hypertension Mother    Heart disease Mother        d. 68    Parkinsonism Father        d. 17   Heart disease Sister    Breast cancer Sister 59       s/p mastectomy; reportedly negative BRCA1/2 testing approx 7 years ago   Melanoma Sister        d. 67s; hx of incresed sun exposure   Cancer Sister        dx. sarcoma of her face in her 98s; treated w/ MOHS surgery   Parkinsonism Paternal Aunt        d. 18s   Parkinsonism Paternal Uncle        d. late 2s   Kidney failure Maternal Grandmother 46   Heart attack Maternal Grandfather        d. 58s   Heart disease Paternal Grandmother        d. 47   Breast cancer Other 77       maternal great aunt (MGF's sister)   Kidney cancer Paternal Uncle 62       d. 96s; unsure about smoking status   The patient's father died at age 75 from complications of Parkinson's disease. The patient's mother died age 91 with heart disease. Kamill had no brothers. She had 2 sisters. One sister was diagnosed with breast cancer at age 57. She was tested for the BRCA gene and was negative. The same sister also had melanoma diagnosed in her 35s. The patient has one maternal great aunt diagnosed with breast cancer around age 46. There is no history of ovarian cancer in the family   GYNECOLOGIC HISTORY:  Patient's last menstrual period was 04/30/1996 (approximate). Menarche age 63, first live birth age 51. The patient is GX P2. She stopped having periods in her early 37s and took hormone replacement until her breast cancer diagnosis, April 2017.   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 He has been in remission for more than 10 years. Their daughter Nira Conn lives in [place] were she is Garment/textile technologist of the [employer]. Daughter Belenda Cruise is an infectious disease M.D. working and teaching at [employer] in  [place]. The patient has 2 grandchildren. The family attends [employer]: Not in place   HEALTH MAINTENANCE: Social History   Tobacco Use   Smoking status: Former Smoker    Packs/day: 0.25    Years: 15.00    Pack years: 3.75    Types: Cigarettes   Smokeless tobacco: Never Used   Tobacco comment: social smoker/"on and off"  Substance Use Topics   Alcohol use:  Yes    Alcohol/week: 0.0 standard drinks    Comment: social   Drug use: No     Colonoscopy: 2016/ [facility]  PAP: 11/2018, negative  Bone density: October 2018, T-score: -2.7  Lipid panel:  Allergies  Allergen Reactions   Chlorhexidine Other (See Comments)    [place] IS OK. The CLOTH WIPE causes reaction. Causes itching all over the area   Other Rash    Adhesive tape    Current Outpatient Medications  Medication Sig Dispense Refill   albuterol (PROVENTIL HFA;VENTOLIN HFA) 108 (90 Base) MCG/ACT inhaler Inhale 1-2 puffs into the lungs every 6 (six) hours as needed for wheezing or shortness of breath. Started in 1998     anastrozole (ARIMIDEX) 1 MG tablet TAKE 1 TABLET(1 MG) BY MOUTH DAILY 90 tablet 0   BRILINTA 90 MG TABS tablet TAKE 1 TABLET(90 MG) BY MOUTH TWICE DAILY 60 tablet 5   buPROPion (WELLBUTRIN XL) 300 MG 24 hr tablet Take 300 mg by mouth daily.      Calcium Carbonate-Vitamin D (CALCIUM + D PO) Take 1 tablet by mouth at bedtime. Started approximately 1998     carisoprodol (SOMA) 350 MG tablet Take 350 mg by mouth 2 (two) times daily as needed for muscle spasms. Started in 2008.  2   chlorhexidine (PERIDEX) 0.12 % solution      clindamycin (CLEOCIN) 300 MG capsule      cycloSPORINE (RESTASIS) 0.05 % ophthalmic emulsion Place 1 drop into both eyes 2 (two) times daily. 30 mL 12   diazepam (VALIUM) 5 MG tablet Take 5 mg by mouth every 6 (six) hours as needed for muscle spasms (interstitial cystitis). Started approximately 2008.     fluconazole (DIFLUCAN) 100 MG tablet  Take 1 tablet (100 mg total) by mouth daily. 30 tablet 0   furosemide (LASIX) 20 MG tablet TAKE 1 TABLET(20 MG) BY MOUTH DAILY AS NEEDED FOR SHORTNESS OF BREATH 30 tablet 6   gabapentin (NEURONTIN) 100 MG capsule Use1-2 tablets  with 300 mg tid for titration for dosage that controls neuropathy pain 120 capsule 1   gabapentin (NEURONTIN) 300 MG capsule Take 1 capsule (300 mg total) by mouth 3 (three) times daily. 90 capsule 3   HYDROcodone-acetaminophen (NORCO) 10-325 MG tablet Take 1 tablet by mouth every 4 (four) hours as needed for moderate pain or severe pain (as needed for fibromyalgia and IC).      hydrOXYzine (ATARAX/VISTARIL) 25 MG tablet Take 1 tablet by mouth at bedtime.     levothyroxine (SYNTHROID, LEVOTHROID) 75 MCG tablet Take 75 mcg by mouth daily.  2   LORazepam (ATIVAN) 2 MG tablet Take 1 tablet by mouth daily as needed for anxiety.   5   nitroGLYCERIN (NITROSTAT) 0.4 MG SL tablet PLACE 1 TABLET UNDER TONGUE EVERY FIVE MINUTES AS NEEDED FOR CHEST PAIN 25 tablet 3   NONFORMULARY OR COMPOUNDED ITEM Vit E vaginal suppositories 255m/ml.  One pv three times weekly.  #36/4RF 36 each 4   pantoprazole (PROTONIX) 40 MG tablet Take 40 mg by mouth daily. Started in 2012.     potassium chloride (K-DUR) 10 MEQ tablet TAKE 1 TABLET BY MOUTH AS NEEDED. TAKE WITH LASIX 30 tablet 11   prochlorperazine (COMPAZINE) 10 MG tablet TAKE 1 TABLET(10 MG) BY MOUTH EVERY 6 HOURS AS NEEDED FOR NAUSEA OR VOMITING 385 tablet 1   rosuvastatin (CRESTOR) 20 MG tablet TAKE 1 TABLET(20 MG) BY MOUTH DAILY 90 tablet 3   terconazole (TERAZOL 7) 0.4 %  vaginal cream Place 1 applicator vaginally at bedtime.     Vaginal Lubricant (REPLENS VA) Place vaginally daily.     Vitamin D, Ergocalciferol, (DRISDOL) 1.25 MG (50000 UT) CAPS capsule TAKE 1 CAPSULE BY MOUTH EVERY 7 DAYS 12 capsule 3   zolpidem (AMBIEN) 10 MG tablet Take 10 mg by mouth at bedtime as needed for sleep.      No current  facility-administered medications for this visit.     OBJECTIVE: white woman who appears younger than stated age  88:   08/04/19 1255  Pulse: 72  Resp: 17  Temp: 98.3 F (36.8 C)  SpO2: 94%     Body mass index is 28.31 kg/m.    ECOG FS:1 - Symptomatic but completely ambulatory  Sclerae unicteric, EOMs intact Wearing a mask No cervical or supraclavicular adenopathy Lungs no rales or rhonchi Heart regular rate and rhythm Abd soft, nontender, positive bowel sounds MSK no focal spinal tenderness, no upper extremity lymphedema Neuro: nonfocal, well oriented, appropriate affect Breasts: The right breast is status post lumpectomy and radiation.  There continues to be some skin coarsening and some erythema but all this is improved as compared to prior.  In the right axilla there is a 1 mm very superficial lesion with no no obvious pore which may be a small scar from prior hidradenitis.  It does not feel like a subcutaneous recurrence.  This will need to be followed up on at the next visit.  Left breast and left axilla are benign.   Right breast 08/15/2016     Right Breast 02/03/2018      LAB RESULTS:  CMP     Component Value Date/Time   NA 143 08/04/2019 1217   NA 138 03/11/2017 1524   K 4.1 08/04/2019 1217   K 3.8 03/11/2017 1524   CL 108 08/04/2019 1217   CO2 24 08/04/2019 1217   CO2 25 03/11/2017 1524   GLUCOSE 106 (H) 08/04/2019 1217   GLUCOSE 136 03/11/2017 1524   BUN 15 08/04/2019 1217   BUN 13.2 03/11/2017 1524   CREATININE 0.84 08/04/2019 1217   CREATININE 0.83 02/24/2019 1224   CREATININE 0.8 03/11/2017 1524   CALCIUM 8.9 08/04/2019 1217   CALCIUM 9.6 03/11/2017 1524   PROT 7.1 08/04/2019 1217   PROT 7.6 03/11/2017 1524   ALBUMIN 3.9 08/04/2019 1217   ALBUMIN 3.8 03/11/2017 1524   AST 18 08/04/2019 1217   AST 19 02/24/2019 1224   AST 24 03/11/2017 1524   ALT 14 08/04/2019 1217   ALT 15 02/24/2019 1224   ALT 38 03/11/2017 1524   ALKPHOS 79  08/04/2019 1217   ALKPHOS 166 (H) 03/11/2017 1524   BILITOT 0.4 08/04/2019 1217   BILITOT 0.5 02/24/2019 1224   BILITOT 0.44 03/11/2017 1524   GFRNONAA >60 08/04/2019 1217   GFRNONAA >60 02/24/2019 1224   GFRAA >60 08/04/2019 1217   GFRAA >60 02/24/2019 1224    INo results found for: SPEP, UPEP  Lab Results  Component Value Date   WBC 6.8 08/04/2019   NEUTROABS 4.6 08/04/2019   HGB 13.4 08/04/2019   HCT 40.5 08/04/2019   MCV 97.1 08/04/2019   PLT 233 08/04/2019      Chemistry      Component Value Date/Time   NA 143 08/04/2019 1217   NA 138 03/11/2017 1524   K 4.1 08/04/2019 1217   K 3.8 03/11/2017 1524   CL 108 08/04/2019 1217   CO2 24 08/04/2019 1217   CO2  25 03/11/2017 1524   BUN 15 08/04/2019 1217   BUN 13.2 03/11/2017 1524   CREATININE 0.84 08/04/2019 1217   CREATININE 0.83 02/24/2019 1224   CREATININE 0.8 03/11/2017 1524      Component Value Date/Time   CALCIUM 8.9 08/04/2019 1217   CALCIUM 9.6 03/11/2017 1524   ALKPHOS 79 08/04/2019 1217   ALKPHOS 166 (H) 03/11/2017 1524   AST 18 08/04/2019 1217   AST 19 02/24/2019 1224   AST 24 03/11/2017 1524   ALT 14 08/04/2019 1217   ALT 15 02/24/2019 1224   ALT 38 03/11/2017 1524   BILITOT 0.4 08/04/2019 1217   BILITOT 0.5 02/24/2019 1224   BILITOT 0.44 03/11/2017 1524       No results found for: LABCA2  No components found for: LABCA125  No results for input(s): INR in the last 168 hours.  Urinalysis    Component Value Date/Time   COLORURINE YELLOW 01/03/2017 1205   APPEARANCEUR HAZY (A) 01/03/2017 1205   LABSPEC 1.006 01/03/2017 1205   PHURINE 7.0 01/03/2017 1205   GLUCOSEU NEGATIVE 01/03/2017 1205   HGBUR SMALL (A) 01/03/2017 1205   BILIRUBINUR NEGATIVE 01/03/2017 1205   BILIRUBINUR n 10/01/2016 1316   KETONESUR NEGATIVE 01/03/2017 1205   PROTEINUR NEGATIVE 01/03/2017 1205   UROBILINOGEN 0.2 10/01/2016 1316   NITRITE NEGATIVE 01/03/2017 1205   LEUKOCYTESUR LARGE (A) 01/03/2017 1205     ELIGIBLE FOR AVAILABLE RESEARCH PROTOCOL: PALLAS   STUDIES: No results found.   ASSESSMENT: 74 y.o. [place] woman status post right breast upper outer quadrant and right axillary lymph node biopsy 08/23/2015, both positive for a clinical T1 N2, stage IIIA invasive ductal carcinoma, grade 2, strongly estrogen receptor positive, 5% progesterone receptor positive, with an MIB-120-30% and no HER-2 amplification  (a) biopsy of a 0.9 cm Right breast satellite nodule 09/13/2015 showed invasive ductal carcinoma, grade 2, E-cadherin strongly positive, with a prognostic panel identical to the larger right upper quadrant mass  (1) left breast biopsy 08/24/2015 shows a complex sclerosing lesion, excised 12/05/2015  (2) neoadjuvant chemotherapy consisting of cyclophosphamide and docetaxel every 21 days 4, with OnPro support, started 09/12/2015, completed 11/14/2015  (3) status post right lumpectomy and sentinel lymph node dissection 12/05/2015 for a residual pT2 pN3, stage IIIC invasive ductal carcinoma, grade 2, with a close anterior margin (skin)  (4) adjuvant radiation 01/18/16 - 11/6/17with capecitabine chemo-sensitization   1) Right breast/nodal areas: 45 Gy in 25 fractions                         2) Right breast boost: 16 Gy in 8 fractions  (5) capecitabine started 04/13/2016, discontinued January 2018 with mild/moderate [name] effects developing  (6) anastrozole started 06/01/2016  (a) bone density January 30, 2017 shows a T score of -2.7 (osteoporosis)  (7) genetics testing (Askenazi descent) 02/07/2016 through the Breast/Ovarian Cancer Panel offered by [facility] Hope Pigeon, MD) i found no deleterious mutations in  ATM, BARD1, BRCA1, BRCA2, BRIP1, CDH1, CHEK2, FANCC, MLH1, MSH2, MSH6, NBN, PALB2, PMS2, PTEN, RAD51C, RAD51D, TP53, and XRCC2.  This panel also includes deletion/duplication analysis (without sequencing) for one gene, EPCAM.  (8) consented to PALLAS trial   (a)  randomized to anastrozole alone   PLAN: Kamesha is now 3-1/2 years out from definitive surgery for her breast cancer with no evidence of disease recurrence.  This is very favorable.  She is tolerating anastrozole well and the plan is to continue that a minimum of 5  years.  She is already scheduled for repeat bone density with her mammogram in June.  If we have worsening osteoporosis she might consider Reclast or zoledronate every 6 months (same drug different dose) which has been associated with a reduction in breast cancer recurrence.  I am concerned as she is that Jerry's condition is deteriorating very rapidly.  I am hoping the lumbar puncture tomorrow will be therapeutic.  She knows to call me if I can be of any help  Otherwise she will see me again in 1 year.  We will make sure to recheck the right axilla at that time  Total encounter time 30 minutes.*     Sonyia Muro, Virgie Dad, MD  08/04/19 1:29 PM Medical Oncology and Hematology [facility] [place], [place] [address] Tel. [phone]    Fax. [phone]   I, Wilburn Mylar, am acting as scribe for Dr. Virgie Dad. Kalis Friese.  I, Lurline Del MD, have reviewed the above documentation for accuracy and completeness, and I agree with the above.   *Total Encounter Time as defined by the Centers for Medicare and Medicaid Services includes, in addition to the face-to-face time of a patient visit (documented in the note above) non-face-to-face time: obtaining and reviewing outside history, ordering and reviewing medications, tests or procedures, care coordination (communications with other health care professionals or caregivers) and documentation in the medical record.

## 2019-08-03 ENCOUNTER — Other Ambulatory Visit: Payer: Self-pay | Admitting: Oncology

## 2019-08-04 ENCOUNTER — Other Ambulatory Visit: Payer: Self-pay

## 2019-08-04 ENCOUNTER — Inpatient Hospital Stay: Payer: Medicare Other | Attending: Oncology

## 2019-08-04 ENCOUNTER — Inpatient Hospital Stay: Payer: Medicare Other | Admitting: *Deleted

## 2019-08-04 ENCOUNTER — Inpatient Hospital Stay (HOSPITAL_BASED_OUTPATIENT_CLINIC_OR_DEPARTMENT_OTHER): Payer: Medicare Other | Admitting: Oncology

## 2019-08-04 ENCOUNTER — Encounter: Payer: Self-pay | Admitting: *Deleted

## 2019-08-04 VITALS — HR 72 | Temp 98.3°F | Resp 17 | Ht 62.0 in | Wt 154.8 lb

## 2019-08-04 DIAGNOSIS — Z79811 Long term (current) use of aromatase inhibitors: Secondary | ICD-10-CM | POA: Diagnosis not present

## 2019-08-04 DIAGNOSIS — Z9221 Personal history of antineoplastic chemotherapy: Secondary | ICD-10-CM | POA: Insufficient documentation

## 2019-08-04 DIAGNOSIS — C50411 Malignant neoplasm of upper-outer quadrant of right female breast: Secondary | ICD-10-CM

## 2019-08-04 DIAGNOSIS — Z808 Family history of malignant neoplasm of other organs or systems: Secondary | ICD-10-CM | POA: Diagnosis not present

## 2019-08-04 DIAGNOSIS — Z803 Family history of malignant neoplasm of breast: Secondary | ICD-10-CM | POA: Insufficient documentation

## 2019-08-04 DIAGNOSIS — M810 Age-related osteoporosis without current pathological fracture: Secondary | ICD-10-CM | POA: Diagnosis not present

## 2019-08-04 DIAGNOSIS — Z170 Estrogen receptor positive status [ER+]: Secondary | ICD-10-CM | POA: Insufficient documentation

## 2019-08-04 DIAGNOSIS — M818 Other osteoporosis without current pathological fracture: Secondary | ICD-10-CM

## 2019-08-04 DIAGNOSIS — Z87891 Personal history of nicotine dependence: Secondary | ICD-10-CM | POA: Diagnosis not present

## 2019-08-04 DIAGNOSIS — Z006 Encounter for examination for normal comparison and control in clinical research program: Secondary | ICD-10-CM | POA: Insufficient documentation

## 2019-08-04 DIAGNOSIS — Z923 Personal history of irradiation: Secondary | ICD-10-CM | POA: Diagnosis not present

## 2019-08-04 LAB — COMPREHENSIVE METABOLIC PANEL
ALT: 14 U/L (ref 0–44)
AST: 18 U/L (ref 15–41)
Albumin: 3.9 g/dL (ref 3.5–5.0)
Alkaline Phosphatase: 79 U/L (ref 38–126)
Anion gap: 11 (ref 5–15)
BUN: 15 mg/dL (ref 8–23)
CO2: 24 mmol/L (ref 22–32)
Calcium: 8.9 mg/dL (ref 8.9–10.3)
Chloride: 108 mmol/L (ref 98–111)
Creatinine, Ser: 0.84 mg/dL (ref 0.44–1.00)
GFR calc Af Amer: >60 mL/min (ref >60)
GFR calc non Af Amer: >60 mL/min (ref >60)
Glucose, Bld: 106 mg/dL — ABNORMAL HIGH (ref 70–99)
Potassium: 4.1 mmol/L (ref 3.5–5.1)
Sodium: 143 mmol/L (ref 135–145)
Total Bilirubin: 0.4 mg/dL (ref 0.3–1.2)
Total Protein: 7.1 g/dL (ref 6.5–8.1)

## 2019-08-04 LAB — CBC WITH DIFFERENTIAL/PLATELET
Abs Immature Granulocytes: 0.01 10*3/uL (ref 0.00–0.07)
Basophils Absolute: 0 10*3/uL (ref 0.0–0.1)
Basophils Relative: 1 %
Eosinophils Absolute: 0.1 10*3/uL (ref 0.0–0.5)
Eosinophils Relative: 2 %
HCT: 40.5 % (ref 36.0–46.0)
Hemoglobin: 13.4 g/dL (ref 12.0–15.0)
Immature Granulocytes: 0 %
Lymphocytes Relative: 23 %
Lymphs Abs: 1.5 10*3/uL (ref 0.7–4.0)
MCH: 32.1 pg (ref 26.0–34.0)
MCHC: 33.1 g/dL (ref 30.0–36.0)
MCV: 97.1 fL (ref 80.0–100.0)
Monocytes Absolute: 0.5 10*3/uL (ref 0.1–1.0)
Monocytes Relative: 7 %
Neutro Abs: 4.6 10*3/uL (ref 1.7–7.7)
Neutrophils Relative %: 67 %
Platelets: 233 10*3/uL (ref 150–400)
RBC: 4.17 MIL/uL (ref 3.87–5.11)
RDW: 14.1 % (ref 11.5–15.5)
WBC: 6.8 10*3/uL (ref 4.0–10.5)
nRBC: 0 % (ref 0.0–0.2)

## 2019-08-04 MED ORDER — ANASTROZOLE 1 MG PO TABS: 1.0000 mg | ORAL_TABLET | Freq: Every day | ORAL | 4 refills | Status: DC

## 2019-08-04 MED ORDER — GABAPENTIN 300 MG PO CAPS: 900.0000 mg | ORAL_CAPSULE | Freq: Every day | ORAL | 4 refills | Status: DC

## 2019-08-04 NOTE — Addendum Note (Signed)
Addended by: Chauncey Cruel on: 08/04/2019 02:22 PM   Modules accepted: Orders

## 2019-08-04 NOTE — Research (Signed)
AFT-05 PALLAS - Reconsent visit  During today's routine study follow-up visit, the following items were reviewed with the patient.  Participant letter, dated 19 Jun 2019: Previously mailed to patient on 07/01/2019 by research department. Patient confirms receipt of letter and understands content of letter.  Informed Consent Form Version 8, dated 17Feb2021: Changes to the ICF were reviewed with the patient including study procedure and data collection changes due to the COVID-19 pandemic, detail of patient visits in the EOT and Follow-up Phases, notification of hospitalizations and additional research blood sample collections at Years 7 and 10. A copy of the signed ICF will be mailed to the patient per her request. In the interim, a blank copy of the form was given to the patient for reference. Cindy S. Brigitte Pulse BSN, RN, [place] 08/04/2019 3:26 PM

## 2019-08-04 NOTE — Research (Signed)
[place] 36 visit  AFT-05 PALLAS - Month 36 follow-up visit Patient into clinic today unaccompanied for routine follow-up visit. See separate Research consent form note regarding study reconsent and Participant letter.  PRO & Adherence Questionnaires: Completed by patient upon arrival to clinic.  Anti-cancer medications - Patient states she is continuing to take anastrozole without interruption; she is on no new anti-cancer medications.   Serious adverse events - Patient denies any new, serious health problems since her last visit.   Disease monitoring - See MD note.  COVID-19 - Patient denies any COVID-19 infection and has not had any testing for COVID-19 since the pandemic began. Patient reports that she received her COVID-19 vaccines in [place] at [facility].  Patient is aware that research nurse will contact her by phone for the Month 42 follow-up visit. Otherwise, she will return to the clinic in April 2022 for her annual in-person PALLAS f/u visit. Patient agrees with this plan. Thanked patient for her participation in this research study. Cindy S. Brigitte Pulse BSN, RN, [place] 08/04/2019 2:27 PM

## 2019-08-07 ENCOUNTER — Telehealth: Payer: Self-pay | Admitting: Oncology

## 2019-08-07 NOTE — Telephone Encounter (Signed)
Scheduled appt per 4/9 sch message - mailed reminder letter with appt date and time

## 2019-08-17 ENCOUNTER — Other Ambulatory Visit: Payer: Self-pay | Admitting: Oncology

## 2019-08-17 NOTE — Progress Notes (Unsigned)
[place]  [phone] [phone]     ID: [name] DOB: March 16, 1946  MR#: [id number]  YWV#:[id number]  Patient Care Team: Seward Carol, MD as PCP - General (Internal Medicine) Jettie Booze, MD as PCP - Cardiology (Cardiology) Marty Uy, Virgie Dad, MD as Consulting Physician (Oncology) Fanny Skates, MD as Consulting Physician (General Surgery) Luberta Robertson, MD (Radiology) Hennie Duos, MD as Consulting Physician (Rheumatology) Domingo Pulse, MD (Urology) Druscilla Brownie, MD as Consulting Physician (Dermatology) Luberta Mutter, MD as Consulting Physician (Ophthalmology) Magnus Sinning, MD as Consulting Physician (Physical Medicine and Rehabilitation) Benson Norway, RN as Registered Nurse (Oncology) Megan Salon, MD as Consulting Physician (Gynecology) Loney Loh, MD (Dermatology) OTHER MD:   CHIEF COMPLAINT: Estrogen receptor positive breast cancer  CURRENT TREATMENT:  Anastrozole   INTERVAL HISTORY: [name] was seen today for follow-up of her estrogen receptor positive breast cancer.  Her study nurse was also present.  [name] continues on the PALLAS trial, observation arm.  The study has fairly closed, this is her 36-monthvisit, and as far as the study goes she will need a visit in a year, but they have also added some additional blood sample collections at years7 and 10. .  She continues on anastrozole.  She tolerates this with no significant [name] effect.  [name]'s last bone density screening on 01/30/2017, showed a T-score of -2.7, which is considered osteoporotic. She is scheduled for repeat study on 10/08/2019.  [facility] [facility]  Since her last visit, she underwent lumbosacral steroid injection on 05/11/2019 under Dr. NErnestina Patches  She does not feel these were helpful  She also received both doses of the Covid-19 vaccine-- on 05/22/2019 and 06/13/2019.   They tolerated that well.   REVIEW OF SYSTEMS: [name] very concerned about her husband JSonia Sidewho has had a very rapid cognitive decline.  He is now incontinent as well as confused.  He is being followed by Dr. YEvelena Leydenin neurology and an LP is planned for tomorrow which hopefully will be helpful.  In addition they have lost all 3 of their pets.  There are beagles who were 135years old apparently died from strokes and their cat suddenly died from a heart attack.  They have a new beagle, [name], who looks extremely intelligent from the photo she showed [place]   BREAST CANCER HISTORY: From the original intake note:  LKanonhad [facility] and possible axillary adenopathy. On 08/23/2015 she underwent bilateral diagnostic mammography with tomography and bilateral breast ultrasonography. The breast density was category B in the right breast there was a spiculated mass in the upper-outer quadrant measuring 1.2 cm and associated with pleomorphic calcifications. There were calcifications extending anteriorly and posteriorly from the mass worrisome for ductal carcinoma in situ. The total area in question is about 3 cm. There were also multiple enlarged right axillary lymph nodes. The mass was palpated as an area of fullness at the 12:00 position of the breast 3 cm from the nipple. There were palpable enlarged right axillary lymph nodes in the right axilla. Ultrasonography of the right breast confirmed an irregular hypoechoic mass in the area in question measuring 1.6 cm. There were multiple abnormal appearing enlarged right axillary lymph nodes, the largest measuring 2.3 cm.  In the left breast, mammography showed an area of architectural distortion associated with the left nipple. This was  not palpable. Ultrasonography of the left breast found no correlate. This area of the left breast was biopsied under tomography 08/24/2015. This  showed (SAA [id number]) a complex sclerosing lesion and fibroadenoma.  On 08/23/2015 the patient underwent biopsy of the right breast mass in 1 of the abnormal right axillary lymph nodes. 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 Both biopsies were HER-2 nonamplified, the breast being ratio 1.27 and number per cell 1.90, and the lymph node 1 ratio 1.24 and number per cell 3.0.  On 08/26/2015 the patient underwent bilateral breast MRI. In the left breast there was no suspicious enhancement. The left axilla was benign. In the right breast there was a mass described as in the upper inner quadrant measuring 2.3 cm. Less than a centimeter away from this mass there was a satellite nodule measuring 0.9 cm. In the right axilla there were multiple enlarged right axillary lymph nodes, including a confluent region measuring up to 4 cm. There were also enlarged level II lymph nodes present measuring up to 2.1 cm.  The patient's subsequent history is as detailed below   PAST MEDICAL HISTORY: Past Medical History:  Diagnosis Date  . Anxiety   . Arthritis 2008  . Asthma 1953   age 25  . Breast cancer ([place])   . Cancer [facility])    breast cancer  . Chronic vaginitis 1998  . Fibromyalgia 1982  . GERD (gastroesophageal reflux disease)    since at least 2011  . Hemorrhoids 2009  . History of radiation therapy 01/18/16-03/05/16   right breast/nodal areas 45 Gy, right breast boost 16 Gy  . Hypoglycemia 2003  . Hypothyroidism 1964   age 1  . Interstitial cystitis 1998  . Irritable bowel syndrome 06/14/2009   Qualifier: Diagnosis of  By: Patsy Baltimore RN, Langley Gauss    . Menopausal symptoms   . Osteoporosis 12/24/2006  . Personal history of chemotherapy   . Personal history of radiation therapy   . Pneumonia 1979   varicella  . PONV (postoperative nausea and  vomiting) 1979    PAST SURGICAL HISTORY: Past Surgical History:  Procedure Laterality Date  . BLADDER HYPEREXTENSION     X4, prior to 01/2006  . BLADDER SUSPENSION    . BREAST BIOPSY    . BREAST LUMPECTOMY Right    2017  . BREAST LUMPECTOMY WITH NEEDLE LOCALIZATION AND AXILLARY LYMPH NODE DISSECTION Right 12/05/2015   Procedure: RIGHT BREAST NEEDLE LOCALIZED (WIRES X 2) LUMPECTOMY WITH AXILLARY LYMPH NODE DISSECTION;  Surgeon: Fanny Skates, MD;  Location: [place] [place];  Service: General;  Laterality: Right;  . BREAST LUMPECTOMY WITH RADIOACTIVE SEED LOCALIZATION Left 12/05/2015   Procedure: RADIOACTIVE SEED GUIDED LEFT BREAST LUMPECTOMY;  Surgeon: Fanny Skates, MD;  Location: [place];  Service: General;  Laterality: Left;  . BREAST SURGERY    . BUNIONECTOMY     prior to 01/2006  . CARDIAC CATHETERIZATION    . CESAREAN SECTION  1979  . CHOLECYSTECTOMY  02/08/2006   DR. INGRAM  . COLONOSCOPY    . CORONARY STENT INTERVENTION N/A 01/01/2017   Procedure: CORONARY STENT INTERVENTION;  Surgeon: Jettie Booze, MD;  Location: [place] [facility];  Service: Cardiovascular;  Laterality: N/A;  . DENTAL SURGERY  2009-2016   13 dental implants  . LEFT HEART CATH AND CORONARY ANGIOGRAPHY N/A 01/01/2017   Procedure: LEFT HEART CATH  AND CORONARY ANGIOGRAPHY;  Surgeon: Jettie Booze, MD;  Location: [place] [facility];  Service: Cardiovascular;  Laterality: N/A;  . LEG SURGERY  07/06/2004   Broken tibia  . NASAL SINUS SURGERY     X4; prior to 01/2006  . PORT-A-CATH REMOVAL Right 12/05/2015   Procedure: REMOVAL PORT-A-CATH;  Surgeon: Fanny Skates, MD;  Location: [place];  Service: General;  Laterality: Right;  . PORTACATH PLACEMENT Left 09/08/2015   Procedure: INSERTION OF PORT-A-CATH ;  Surgeon: Fanny Skates, MD;  Location: [facility];  Service: General;  Laterality: Left;    FAMILY HISTORY Family History  Problem Relation Age of Onset  . Hypertension Mother   . Heart disease Mother        d. 48    . Parkinsonism Father        d. 70  . Heart disease Sister   . Breast cancer Sister 63       s/p mastectomy; reportedly negative BRCA1/2 testing approx 7 years ago  . Melanoma Sister        d. 43s; hx of incresed sun exposure  . Cancer Sister        dx. sarcoma of her face in her 29s; treated w/ MOHS surgery  . Parkinsonism Paternal Aunt        d. 90s  . Parkinsonism Paternal Uncle        d. late 71s  . Kidney failure Maternal Grandmother 1  . Heart attack Maternal Grandfather        d. 14s  . Heart disease Paternal Grandmother        d. 45  . Breast cancer Other 6       maternal great aunt (MGF's sister)  . Kidney cancer Paternal Uncle 28       d. 67s; unsure about smoking status   The patient's father died at age 23 from complications of Parkinson's disease. The patient's mother died age 80 with heart disease. [name] had no brothers. She had 2 sisters. One sister was diagnosed with breast cancer at age 80. She was tested for the BRCA gene and was negative. The same sister also had melanoma diagnosed in her 45s. The patient has one maternal great aunt diagnosed with breast cancer around age 25. There is no history of ovarian cancer in the family   GYNECOLOGIC HISTORY:  Patient's last menstrual period was 04/30/1996 (approximate). Menarche age 50, first live birth age 33. The patient is GX P2. She stopped having periods in her early 8s and took hormone replacement until her breast cancer diagnosis, April 2017.   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 He has been in remission for more than 10 years. Their daughter [name] lives in [place] were she is Garment/textile technologist of the [employer]. Daughter [name] is an infectious disease M.D. working and teaching at [employer] in  [place]. The patient has 2 grandchildren. The family attends [employer]: Not in place   HEALTH MAINTENANCE: Social History   Tobacco Use  . Smoking status: Former Smoker    Packs/day: 0.25    Years: 15.00    Pack years: 3.75    Types: Cigarettes  . Smokeless tobacco: Never Used  . Tobacco comment: social smoker/"on and off"  Substance Use  Topics  . Alcohol use: Yes    Alcohol/week: 0.0 standard drinks    Comment: social  . Drug use: No     Colonoscopy: 2016/ [facility]  PAP: 11/2018, negative  Bone density: October 2018, T-score: -2.7  Lipid panel:  Allergies  Allergen Reactions  . Chlorhexidine Other (See Comments)    [place] IS OK. The CLOTH WIPE causes reaction. Causes itching all over the area  . Other Rash    Adhesive tape    Current Outpatient Medications  Medication Sig Dispense Refill  . albuterol (PROVENTIL HFA;VENTOLIN HFA) 108 (90 Base) MCG/ACT inhaler Inhale 1-2 puffs into the lungs every 6 (six) hours as needed for wheezing or shortness of breath. Started in 1998    . anastrozole (ARIMIDEX) 1 MG tablet Take 1 tablet (1 mg total) by mouth daily. 90 tablet 4  . BRILINTA 90 MG TABS tablet TAKE 1 TABLET(90 MG) BY MOUTH TWICE DAILY 60 tablet 5  . buPROPion (WELLBUTRIN XL) 300 MG 24 hr tablet Take 300 mg by mouth daily.     . Calcium Carbonate-Vitamin D (CALCIUM + D PO) Take 1 tablet by mouth at bedtime. Started approximately 1998    . carisoprodol (SOMA) 350 MG tablet Take 350 mg by mouth 2 (two) times daily as needed for muscle spasms. Started in 2008.  2  . chlorhexidine (PERIDEX) 0.12 % solution     . clindamycin (CLEOCIN) 300 MG capsule     . cycloSPORINE (RESTASIS) 0.05 % ophthalmic emulsion Place 1 drop into both eyes 2 (two) times daily. 30 mL 12  . diazepam (VALIUM) 5 MG tablet Take 5 mg by mouth every 6 (six) hours as needed for muscle spasms (interstitial cystitis). Started approximately 2008.    . fluconazole (DIFLUCAN) 100 MG  tablet Take 1 tablet (100 mg total) by mouth daily. 30 tablet 0  . furosemide (LASIX) 20 MG tablet TAKE 1 TABLET(20 MG) BY MOUTH DAILY AS NEEDED FOR SHORTNESS OF BREATH 30 tablet 6  . gabapentin (NEURONTIN) 300 MG capsule Take 3-4 capsules (900-1,200 mg total) by mouth at bedtime. 120 capsule 4  . HYDROcodone-acetaminophen (NORCO) 10-325 MG tablet Take 1 tablet by mouth every 4 (four) hours as needed for moderate pain or severe pain (as needed for fibromyalgia and IC).     . hydrOXYzine (ATARAX/VISTARIL) 25 MG tablet Take 1 tablet by mouth at bedtime.    Marland Kitchen levothyroxine (SYNTHROID, LEVOTHROID) 75 MCG tablet Take 75 mcg by mouth daily.  2  . LORazepam (ATIVAN) 2 MG tablet Take 1 tablet by mouth daily as needed for anxiety.   5  . nitroGLYCERIN (NITROSTAT) 0.4 MG SL tablet PLACE 1 TABLET UNDER TONGUE EVERY FIVE MINUTES AS NEEDED FOR CHEST PAIN 25 tablet 3  . NONFORMULARY OR COMPOUNDED ITEM Vit E vaginal suppositories 255m/ml.  One pv three times weekly.  #36/4RF 36 each 4  . pantoprazole (PROTONIX) 40 MG tablet Take 40 mg by mouth daily. Started in 2012.    . potassium chloride (K-DUR) 10 MEQ tablet TAKE 1 TABLET BY MOUTH AS NEEDED. TAKE WITH LASIX 30 tablet 11  . prochlorperazine (COMPAZINE) 10 MG tablet TAKE 1 TABLET(10 MG) BY MOUTH EVERY 6 HOURS AS NEEDED FOR NAUSEA OR VOMITING 385 tablet 1  . rosuvastatin (CRESTOR) 20 MG tablet TAKE 1 TABLET(20 MG) BY MOUTH DAILY 90 tablet 3  . terconazole (TERAZOL 7) 0.4 % vaginal cream Place 1 applicator vaginally at bedtime.    . Vaginal Lubricant (REPLENS VA) Place vaginally daily.    .Marland Kitchen  Vitamin D, Ergocalciferol, (DRISDOL) 1.25 MG (50000 UT) CAPS capsule TAKE 1 CAPSULE BY MOUTH EVERY 7 DAYS 12 capsule 3  . zolpidem (AMBIEN) 10 MG tablet Take 10 mg by mouth at bedtime as needed for sleep.      No current facility-administered medications for this visit.     OBJECTIVE: white woman who appears younger than stated age  There were no vitals filed for this  visit.   There is no height or weight on file to calculate BMI.    ECOG FS:1 - Symptomatic but completely ambulatory  Sclerae unicteric, EOMs intact Wearing a mask No cervical or supraclavicular adenopathy Lungs no rales or rhonchi Heart regular rate and rhythm Abd soft, nontender, positive bowel sounds MSK no focal spinal tenderness, no upper extremity lymphedema Neuro: nonfocal, well oriented, appropriate affect Breasts: The right breast is status post lumpectomy and radiation.  There continues to be some skin coarsening and some erythema but all this is improved as compared to prior.  In the right axilla there is a 1 mm very superficial lesion with no no obvious pore which may be a small scar from prior hidradenitis.  It does not feel like a subcutaneous recurrence.  This will need to be followed up on at the next visit.  Left breast and left axilla are benign.   Right breast 08/15/2016     Right Breast 02/03/2018      LAB RESULTS:  CMP     Component Value Date/Time   NA 143 08/04/2019 1217   NA 138 03/11/2017 1524   K 4.1 08/04/2019 1217   K 3.8 03/11/2017 1524   CL 108 08/04/2019 1217   CO2 24 08/04/2019 1217   CO2 25 03/11/2017 1524   GLUCOSE 106 (H) 08/04/2019 1217   GLUCOSE 136 03/11/2017 1524   BUN 15 08/04/2019 1217   BUN 13.2 03/11/2017 1524   CREATININE 0.84 08/04/2019 1217   CREATININE 0.83 02/24/2019 1224   CREATININE 0.8 03/11/2017 1524   CALCIUM 8.9 08/04/2019 1217   CALCIUM 9.6 03/11/2017 1524   PROT 7.1 08/04/2019 1217   PROT 7.6 03/11/2017 1524   ALBUMIN 3.9 08/04/2019 1217   ALBUMIN 3.8 03/11/2017 1524   AST 18 08/04/2019 1217   AST 19 02/24/2019 1224   AST 24 03/11/2017 1524   ALT 14 08/04/2019 1217   ALT 15 02/24/2019 1224   ALT 38 03/11/2017 1524   ALKPHOS 79 08/04/2019 1217   ALKPHOS 166 (H) 03/11/2017 1524   BILITOT 0.4 08/04/2019 1217   BILITOT 0.5 02/24/2019 1224   BILITOT 0.44 03/11/2017 1524   GFRNONAA >60 08/04/2019 1217    GFRNONAA >60 02/24/2019 1224   GFRAA >60 08/04/2019 1217   GFRAA >60 02/24/2019 1224    INo results found for: SPEP, UPEP  Lab Results  Component Value Date   WBC 6.8 08/04/2019   NEUTROABS 4.6 08/04/2019   HGB 13.4 08/04/2019   HCT 40.5 08/04/2019   MCV 97.1 08/04/2019   PLT 233 08/04/2019      Chemistry      Component Value Date/Time   NA 143 08/04/2019 1217   NA 138 03/11/2017 1524   K 4.1 08/04/2019 1217   K 3.8 03/11/2017 1524   CL 108 08/04/2019 1217   CO2 24 08/04/2019 1217   CO2 25 03/11/2017 1524   BUN 15 08/04/2019 1217   BUN 13.2 03/11/2017 1524   CREATININE 0.84 08/04/2019 1217   CREATININE 0.83 02/24/2019 1224   CREATININE 0.8 03/11/2017  1524      Component Value Date/Time   CALCIUM 8.9 08/04/2019 1217   CALCIUM 9.6 03/11/2017 1524   ALKPHOS 79 08/04/2019 1217   ALKPHOS 166 (H) 03/11/2017 1524   AST 18 08/04/2019 1217   AST 19 02/24/2019 1224   AST 24 03/11/2017 1524   ALT 14 08/04/2019 1217   ALT 15 02/24/2019 1224   ALT 38 03/11/2017 1524   BILITOT 0.4 08/04/2019 1217   BILITOT 0.5 02/24/2019 1224   BILITOT 0.44 03/11/2017 1524       No results found for: LABCA2  No components found for: LABCA125  No results for input(s): INR in the last 168 hours.  Urinalysis    Component Value Date/Time   COLORURINE YELLOW 01/03/2017 1205   APPEARANCEUR HAZY (A) 01/03/2017 1205   LABSPEC 1.006 01/03/2017 1205   PHURINE 7.0 01/03/2017 1205   GLUCOSEU NEGATIVE 01/03/2017 1205   HGBUR SMALL (A) 01/03/2017 1205   BILIRUBINUR NEGATIVE 01/03/2017 1205   BILIRUBINUR n 10/01/2016 1316   KETONESUR NEGATIVE 01/03/2017 1205   PROTEINUR NEGATIVE 01/03/2017 1205   UROBILINOGEN 0.2 10/01/2016 1316   NITRITE NEGATIVE 01/03/2017 1205   LEUKOCYTESUR LARGE (A) 01/03/2017 1205    ELIGIBLE FOR AVAILABLE RESEARCH PROTOCOL: PALLAS   STUDIES: No results found.   ASSESSMENT: 74 y.o. [place] woman status post right breast upper outer quadrant and right  axillary lymph node biopsy 08/23/2015, both positive for a clinical T1 N2, stage IIIA invasive ductal carcinoma, grade 2, strongly estrogen receptor positive, 5% progesterone receptor positive, with an MIB-120-30% and no HER-2 amplification  (a) biopsy of a 0.9 cm Right breast satellite nodule 09/13/2015 showed invasive ductal carcinoma, grade 2, E-cadherin strongly positive, with a prognostic panel identical to the larger right upper quadrant mass  (1) left breast biopsy 08/24/2015 shows a complex sclerosing lesion, excised 12/05/2015  (2) neoadjuvant chemotherapy consisting of cyclophosphamide and docetaxel every 21 days 4, with OnPro support, started 09/12/2015, completed 11/14/2015  (3) status post right lumpectomy and sentinel lymph node dissection 12/05/2015 for a residual pT2 pN3, stage IIIC invasive ductal carcinoma, grade 2, with a close anterior margin (skin)  (4) adjuvant radiation 01/18/16 - 11/6/17with capecitabine chemo-sensitization   1) Right breast/nodal areas: 45 Gy in 25 fractions                         2) Right breast boost: 16 Gy in 8 fractions  (5) capecitabine started 04/13/2016, discontinued January 2018 with mild/moderate [name] effects developing  (6) anastrozole started 06/01/2016  (a) bone density January 30, 2017 shows a T score of -2.7 (osteoporosis)  (7) genetics testing (Askenazi descent) 02/07/2016 through the Breast/Ovarian Cancer Panel offered by [facility] Hope Pigeon, MD) i found no deleterious mutations in  ATM, BARD1, BRCA1, BRCA2, BRIP1, CDH1, CHEK2, FANCC, MLH1, MSH2, MSH6, NBN, PALB2, PMS2, PTEN, RAD51C, RAD51D, TP53, and XRCC2.  This panel also includes deletion/duplication analysis (without sequencing) for one gene, EPCAM.  (8) consented to PALLAS trial   (a) randomized to anastrozole alone   PLAN: Keoni is now 3-1/2 years out from definitive surgery for her breast cancer with no evidence of disease recurrence.  This is very  favorable.  She is tolerating anastrozole well and the plan is to continue that a minimum of 5 years.  She is already scheduled for repeat bone density with her mammogram in June.  If we have worsening osteoporosis she might consider Reclast or zoledronate every 6 months (same drug  different dose) which has been associated with a reduction in breast cancer recurrence.  I am concerned as she is that Jerry's condition is deteriorating very rapidly.  I am hoping the lumbar puncture tomorrow will be therapeutic.  She knows to call me if I can be of any help  Otherwise she will see me again in 1 year.  We will make sure to recheck the right axilla at that time  Total encounter time 30 minutes.*     Ciana Simmon, Virgie Dad, MD  08/17/19 4:17 PM Medical Oncology and Hematology [facility] [place], [place] [address] Tel. [phone]    Fax. [phone]   I, Wilburn Mylar, am acting as scribe for Dr. Virgie Dad. Pershing Sk[place]more.  I, Lurline Del MD, have reviewed the above documentation for accuracy and completeness, and I agree with the above.   *Total Encounter Time as defined by the Centers for Medicare and Medicaid Services includes, in addition to the face-to-face time of a patient visit (documented in the note above) non-face-to-face time: obtaining and reviewing outside history, ordering and reviewing medications, tests or procedures, care coordination (communications with other health care professionals or caregivers) and documentation in the medical record.

## 2019-08-23 ENCOUNTER — Other Ambulatory Visit: Payer: Self-pay | Admitting: Cardiology

## 2019-08-24 NOTE — Telephone Encounter (Signed)
Rx request sent to pharmacy.  

## 2019-10-07 ENCOUNTER — Telehealth: Payer: Self-pay | Admitting: *Deleted

## 2019-10-07 MED ORDER — ONDANSETRON HCL 8 MG PO TABS: 8.0000 mg | ORAL_TABLET | Freq: Three times a day (TID) | ORAL | 1 refills | Status: DC | PRN

## 2019-10-07 NOTE — Telephone Encounter (Signed)
[name] states she is continuing to have post chemo nausea. Is requesting a new prescription for zofran. Has been using compazine, but sometimes it does not help. She is currently very stressed with husband's illness. He is taking chemotherapy and has been having memory problems since taking 2nd covid vaccine.

## 2019-10-07 NOTE — Telephone Encounter (Signed)
Prescription sent to [facility] for zofran 8 mg.

## 2019-10-08 ENCOUNTER — Other Ambulatory Visit: Payer: Medicare Other

## 2019-10-16 ENCOUNTER — Other Ambulatory Visit: Payer: Self-pay | Admitting: Cardiology

## 2019-11-02 ENCOUNTER — Other Ambulatory Visit: Payer: Self-pay | Admitting: Oncology

## 2019-11-15 ENCOUNTER — Other Ambulatory Visit: Payer: Self-pay | Admitting: Interventional Cardiology

## 2019-11-18 ENCOUNTER — Other Ambulatory Visit: Payer: Self-pay | Admitting: Oncology

## 2019-11-18 MED ORDER — CEPHALEXIN 500 MG PO CAPS
500.0000 mg | ORAL_CAPSULE | Freq: Two times a day (BID) | ORAL | 0 refills | Status: DC
Start: 2019-11-18 — End: 2020-01-15

## 2019-11-18 NOTE — Progress Notes (Signed)
[name] stopped for a minute because she was having a little bit of swelling and a little bit of erythema in the right arm and she wanted me to look at it.  She does indeed have very minimal cellulitis in the right arm with minimal swelling.  It is however noticeable.  I am going to go ahead and put her on Keflex for 5 days and 7 days if 5 days is not sufficient.  I wrote the prescription for her and gave her the instructions.  I did ask her to call us 2 days from now just to make sure things are getting better.  If not we will do a CT of the chest  In the right axilla she has a very minimal area of irregularity which appears unchanged from baseline.  It is linear, measures 3 mm, subcutaneous, not erythematous, not tender, and does not feel like a subcutaneous recurrence.  We will be following this at the subsequent visits.

## 2019-11-20 ENCOUNTER — Telehealth: Payer: Self-pay | Admitting: Oncology

## 2019-11-20 ENCOUNTER — Other Ambulatory Visit: Payer: Self-pay | Admitting: Oncology

## 2019-11-20 DIAGNOSIS — Z170 Estrogen receptor positive status [ER+]: Secondary | ICD-10-CM

## 2019-11-20 DIAGNOSIS — C50411 Malignant neoplasm of upper-outer quadrant of right female breast: Secondary | ICD-10-CM

## 2019-11-20 NOTE — Telephone Encounter (Signed)
Scheduled appointment per 7/23 provider message. Patient is aware of appointments dates and times. Patient will also be calling radiology to schedule CT scan and mammogram.

## 2019-11-26 ENCOUNTER — Other Ambulatory Visit: Payer: Self-pay | Admitting: Oncology

## 2019-11-26 DIAGNOSIS — Z170 Estrogen receptor positive status [ER+]: Secondary | ICD-10-CM

## 2019-11-26 NOTE — Progress Notes (Signed)
[name]'s arm swelling got better then got worse and got better and then got worse.  I am going to obtain a CT of the chest just to make sure were not dealing with some new adenopathy causing blockage.  If that is negative we can consider a Doppler study  I also asked her to get a compression sleeve.  They will call us from the medical supply store if necessary to get a prescription.

## 2019-11-30 ENCOUNTER — Encounter (HOSPITAL_COMMUNITY): Payer: Self-pay

## 2019-11-30 ENCOUNTER — Other Ambulatory Visit: Payer: Self-pay | Admitting: *Deleted

## 2019-11-30 ENCOUNTER — Other Ambulatory Visit: Payer: Self-pay

## 2019-11-30 ENCOUNTER — Ambulatory Visit (HOSPITAL_COMMUNITY)
Admission: RE | Admit: 2019-11-30 | Discharge: 2019-11-30 | Disposition: A | Discharge status: Home / Self Care | Payer: Medicare Other | Source: Ambulatory Visit | Attending: Oncology | Admitting: Oncology

## 2019-11-30 DIAGNOSIS — C50411 Malignant neoplasm of upper-outer quadrant of right female breast: Secondary | ICD-10-CM

## 2019-11-30 DIAGNOSIS — Z170 Estrogen receptor positive status [ER+]: Secondary | ICD-10-CM

## 2019-11-30 LAB — POCT I-STAT CREATININE: Creatinine, Ser: 0.9 mg/dL (ref 0.44–1.00)

## 2019-11-30 MED ORDER — SODIUM CHLORIDE (PF) 0.9 % IJ SOLN
INTRAMUSCULAR | Status: AC
  Filled 2019-11-30: qty 50

## 2019-11-30 MED ORDER — IOHEXOL 300 MG/ML  SOLN
100.0000 mL | Freq: Once | INTRAMUSCULAR | Status: AC | PRN
  Administered 2019-11-30: 75 mL via INTRAVENOUS

## 2019-12-01 ENCOUNTER — Other Ambulatory Visit: Payer: Self-pay | Admitting: Oncology

## 2019-12-01 DIAGNOSIS — F4323 Adjustment disorder with mixed anxiety and depressed mood: Secondary | ICD-10-CM | POA: Insufficient documentation

## 2019-12-01 MED ORDER — AZITHROMYCIN 250 MG PO TABS
ORAL_TABLET | ORAL | 0 refills | Status: DC
Start: 2019-12-01 — End: 2020-01-15

## 2019-12-01 NOTE — Progress Notes (Signed)
[name] called today to tell me that her arm was a little pinker and a little more swollen after having improved on Keflex.  We are going to go with a Z-Pak.  She is going to get a sleeve today.  Unfortunately her husband sister is dying from lung cancer in [place].  This is adding to the family stress.

## 2019-12-03 ENCOUNTER — Other Ambulatory Visit: Payer: Self-pay | Admitting: Oncology

## 2019-12-03 ENCOUNTER — Other Ambulatory Visit: Payer: Self-pay | Admitting: *Deleted

## 2019-12-03 ENCOUNTER — Encounter: Payer: Self-pay | Admitting: Oncology

## 2019-12-03 ENCOUNTER — Other Ambulatory Visit: Payer: Self-pay

## 2019-12-03 ENCOUNTER — Ambulatory Visit (HOSPITAL_COMMUNITY)
Admission: RE | Admit: 2019-12-03 | Discharge: 2019-12-03 | Disposition: A | Discharge status: Home / Self Care | Payer: Medicare Other | Source: Ambulatory Visit | Attending: Oncology | Admitting: Oncology

## 2019-12-03 DIAGNOSIS — C50411 Malignant neoplasm of upper-outer quadrant of right female breast: Secondary | ICD-10-CM | POA: Diagnosis not present

## 2019-12-03 DIAGNOSIS — Z170 Estrogen receptor positive status [ER+]: Secondary | ICD-10-CM | POA: Insufficient documentation

## 2019-12-03 MED ORDER — CEPHALEXIN 500 MG PO CAPS
500.0000 mg | ORAL_CAPSULE | Freq: Four times a day (QID) | ORAL | 0 refills | Status: DC
Start: 2019-12-03 — End: 2020-01-15

## 2019-12-03 NOTE — Telephone Encounter (Signed)
Per MD - pt obtained a doppler study due to continued redness/localized swelling of left arm with negative reading.  Per MD recommendation is for the pt to hold the zithromax and take keflex at higher dose.  Discussed with the patient.  Prescription sent to verified pharmacy.

## 2019-12-03 NOTE — Progress Notes (Addendum)
upper extremity venous has been completed.   Preliminary results in CV Proc.  Attempted to call results, no answer.   Abram Sander 12/03/2019 12:36 PM

## 2019-12-07 ENCOUNTER — Ambulatory Visit (HOSPITAL_COMMUNITY): Payer: Medicare Other

## 2020-01-08 ENCOUNTER — Other Ambulatory Visit: Payer: Self-pay | Admitting: Oncology

## 2020-01-15 ENCOUNTER — Other Ambulatory Visit: Payer: Self-pay

## 2020-01-15 ENCOUNTER — Inpatient Hospital Stay: Payer: Medicare Other

## 2020-01-15 ENCOUNTER — Inpatient Hospital Stay: Payer: Medicare Other | Attending: Oncology | Admitting: Oncology

## 2020-01-15 ENCOUNTER — Encounter: Payer: Self-pay | Admitting: *Deleted

## 2020-01-15 VITALS — BP 138/75 | HR 74 | Temp 97.3°F | Resp 18 | Ht 62.0 in | Wt 153.5 lb

## 2020-01-15 DIAGNOSIS — L539 Erythematous condition, unspecified: Secondary | ICD-10-CM | POA: Insufficient documentation

## 2020-01-15 DIAGNOSIS — Z170 Estrogen receptor positive status [ER+]: Secondary | ICD-10-CM | POA: Diagnosis not present

## 2020-01-15 DIAGNOSIS — C50411 Malignant neoplasm of upper-outer quadrant of right female breast: Secondary | ICD-10-CM

## 2020-01-15 DIAGNOSIS — Z87891 Personal history of nicotine dependence: Secondary | ICD-10-CM | POA: Diagnosis not present

## 2020-01-15 DIAGNOSIS — C773 Secondary and unspecified malignant neoplasm of axilla and upper limb lymph nodes: Secondary | ICD-10-CM | POA: Diagnosis not present

## 2020-01-15 LAB — COMPREHENSIVE METABOLIC PANEL
ALT: 16 U/L (ref 0–44)
AST: 20 U/L (ref 15–41)
Albumin: 3.7 g/dL (ref 3.5–5.0)
Alkaline Phosphatase: 81 U/L (ref 38–126)
Anion gap: 7 (ref 5–15)
BUN: 16 mg/dL (ref 8–23)
CO2: 28 mmol/L (ref 22–32)
Calcium: 9.1 mg/dL (ref 8.9–10.3)
Chloride: 106 mmol/L (ref 98–111)
Creatinine, Ser: 0.89 mg/dL (ref 0.44–1.00)
GFR calc Af Amer: >60 mL/min (ref >60)
GFR calc non Af Amer: >60 mL/min (ref >60)
Glucose, Bld: 114 mg/dL — ABNORMAL HIGH (ref 70–99)
Potassium: 4.3 mmol/L (ref 3.5–5.1)
Sodium: 141 mmol/L (ref 135–145)
Total Bilirubin: 0.5 mg/dL (ref 0.3–1.2)
Total Protein: 7 g/dL (ref 6.5–8.1)

## 2020-01-15 LAB — CBC WITH DIFFERENTIAL/PLATELET
Abs Immature Granulocytes: 0.02 10*3/uL (ref 0.00–0.07)
Basophils Absolute: 0 10*3/uL (ref 0.0–0.1)
Basophils Relative: 0 %
Eosinophils Absolute: 0.1 10*3/uL (ref 0.0–0.5)
Eosinophils Relative: 1 %
HCT: 41.1 % (ref 36.0–46.0)
Hemoglobin: 13.7 g/dL (ref 12.0–15.0)
Immature Granulocytes: 0 %
Lymphocytes Relative: 20 %
Lymphs Abs: 1.4 10*3/uL (ref 0.7–4.0)
MCH: 32.5 pg (ref 26.0–34.0)
MCHC: 33.3 g/dL (ref 30.0–36.0)
MCV: 97.4 fL (ref 80.0–100.0)
Monocytes Absolute: 0.4 10*3/uL (ref 0.1–1.0)
Monocytes Relative: 6 %
Neutro Abs: 5.3 10*3/uL (ref 1.7–7.7)
Neutrophils Relative %: 73 %
Platelets: 218 10*3/uL (ref 150–400)
RBC: 4.22 MIL/uL (ref 3.87–5.11)
RDW: 13.4 % (ref 11.5–15.5)
WBC: 7.3 10*3/uL (ref 4.0–10.5)
nRBC: 0 % (ref 0.0–0.2)

## 2020-01-15 MED ORDER — AMOXICILLIN-POT CLAVULANATE 875-125 MG PO TABS
1.0000 | ORAL_TABLET | Freq: Two times a day (BID) | ORAL | 1 refills | Status: DC
Start: 2020-01-15 — End: 2020-01-15

## 2020-01-15 MED ORDER — AMOXICILLIN-POT CLAVULANATE 875-125 MG PO TABS: 1.0000 | ORAL_TABLET | Freq: Two times a day (BID) | ORAL | 1 refills | Status: DC

## 2020-01-15 NOTE — Progress Notes (Signed)
[facility]  [phone] [phone]     ID: [name] DOB: 07-Nov-1945  MR#: [id number]  UMF#:[id number]  Patient Care Team: Renford Dills, MD as PCP - General (Internal Medicine) Corky Crafts, MD as PCP - Cardiology (Cardiology) Tomicka Lover, Valentino Hue, MD as Consulting Physician (Oncology) Claud Kelp, MD as Consulting Physician (General Surgery) Anselmo Pickler, MD (Radiology) Donnetta Hail, MD as Consulting Physician (Rheumatology) Jamison Neighbor, MD (Urology) Cherlyn Roberts, MD as Consulting Physician (Dermatology) Maris Berger, MD as Consulting Physician (Ophthalmology) Tyrell Antonio, MD as Consulting Physician (Physical Medicine and Rehabilitation) Jerene Bears, MD as Consulting Physician (Gynecology) Basilio Cairo, MD (Dermatology) OTHER MD:   CHIEF COMPLAINT: Estrogen receptor positive breast cancer  CURRENT TREATMENT:  Anastrozole   INTERVAL HISTORY: [name] was seen today for follow-up of her estrogen receptor positive breast cancer.  Her study nurse was also present.  She returns today because of continuing or progressive erythema of the right lower extremity.  She presented to me on 11/18/2019 with right arm swelling. She was started on Keflex with improvement but without definitive resolution.  Her Keflex dose was increased from twice daily to 4 times daily with again partial response.  She underwent chest CT on 11/30/2019 showing: progressive skin thickening of right breast and slightly progressive nodularity in subareolar region, findings could be due to radiation; stable extensive bilateral micro-nodularity, no new pulmonary nodules or acute overlying pulmonary process; no mediastinal or hilar mass or adenopathy.   I started her on a Z-Pak on 12/01/2019.  Again this  helped but did not clear the problem.    She proceeded to upper extremity doppler on 12/03/2019 showing no evidence of thrombosis.  [name]  continues on the PALLAS trial, observation arm.  The study has fairly closed, this is her 59-month visit, and as far as the study goes she will need a visit in a year, but they have also added some additional blood sample collections at years7 and 10.   She continues on anastrozole.  She tolerates this with no significant [name] effect.  [name] last bone density screening on 01/30/2017, showed a T-score of -2.7, which is considered osteoporotic.   Her most recent mammography was on 02/09/2019 at [facility].   REVIEW OF SYSTEMS: [name] has had no fever, has not had any exposure that could explain the right lower arm erythema, and has had no significant right upper extremity swelling.  Her husband recently required a big toe amputation in the setting of sepsis but has recovered nicely.  A detailed review of systems today was otherwise noncontributory   BREAST CANCER HISTORY: From the original intake note:  [name] had routine mammographic screening in 08/18/2015 at [facility], showing a right breast mass and possible axillary adenopathy. On 08/23/2015 she underwent bilateral diagnostic mammography with tomography and bilateral breast ultrasonography. The breast density was category B in the right breast there was a spiculated mass in the upper-outer quadrant measuring 1.2 cm and associated with pleomorphic calcifications. There were calcifications extending anteriorly and posteriorly from the mass worrisome for ductal carcinoma in situ. The total area in question is about 3 cm. There were also multiple enlarged right axillary lymph nodes. The mass was palpated as an area of fullness at the 12:00 position of the breast 3 cm from the nipple. There were palpable enlarged right axillary lymph nodes in the right axilla. Ultrasonography of the right breast confirmed an irregular hypoechoic mass in the area  in question measuring 1.6 cm. There were multiple abnormal appearing enlarged right axillary  lymph nodes, the largest measuring 2.3 cm.  In the left breast, mammography showed an area of architectural distortion associated with the left nipple. This was not palpable. Ultrasonography of the left breast found no correlate. This area of the left breast was biopsied under tomography 08/24/2015. This showed (SAA [id number]) a complex sclerosing lesion and fibroadenoma.  On 08/23/2015 the patient underwent biopsy of the right breast mass in 1 of the abnormal right axillary lymph nodes. 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 Both biopsies were HER-2 nonamplified, the breast being ratio 1.27 and number per cell 1.90, and the lymph node 1 ratio 1.24 and number per cell 3.0.  On 08/26/2015 the patient underwent bilateral breast MRI. In the left breast there was no suspicious enhancement. The left axilla was benign. In the right breast there was a mass described as in the upper inner quadrant measuring 2.3 cm. Less than a centimeter away from this mass there was a satellite nodule measuring 0.9 cm. In the right axilla there were multiple enlarged right axillary lymph nodes, including a confluent region measuring up to 4 cm. There were also enlarged level II lymph nodes present measuring up to 2.1 cm.  The patient's subsequent history is as detailed below   PAST MEDICAL HISTORY: Past Medical History:  Diagnosis Date  . Anxiety   . Arthritis 2008  . Asthma 1953   age 35  . Breast cancer ([place]) dx'd 2017   right  . [facility])    breast cancer  . Chronic vaginitis 1998  . Fibromyalgia 1982  . GERD (gastroesophageal reflux disease)    since at least 2011  . Hemorrhoids 2009  . History of radiation therapy 01/18/16-03/05/16   right breast/nodal areas 45 Gy, right breast boost 16 Gy  . Hypoglycemia 2003  . Hypothyroidism 1964   age 17    . Interstitial cystitis 1998  . Irritable bowel syndrome 06/14/2009   Qualifier: Diagnosis of  By: Patsy Baltimore RN, Langley Gauss    . Menopausal symptoms   . Osteoporosis 12/24/2006  . Personal history of chemotherapy   . Personal history of radiation therapy   . Pneumonia 1979   varicella  . PONV (postoperative nausea and vomiting) 1979    PAST SURGICAL HISTORY: Past Surgical History:  Procedure Laterality Date  . BLADDER HYPEREXTENSION     X4, prior to 01/2006  . BLADDER SUSPENSION    . BREAST BIOPSY    . BREAST LUMPECTOMY Right    2017  . BREAST LUMPECTOMY WITH NEEDLE LOCALIZATION AND AXILLARY LYMPH NODE DISSECTION Right 12/05/2015   Procedure: RIGHT BREAST NEEDLE LOCALIZED (WIRES X 2) LUMPECTOMY WITH AXILLARY LYMPH NODE DISSECTION;  Surgeon: Fanny Skates, MD;  Location: [place];  Service: General;  Laterality: Right;  . BREAST LUMPECTOMY WITH RADIOACTIVE SEED LOCALIZATION Left 12/05/2015   Procedure: RADIOACTIVE SEED GUIDED LEFT BREAST LUMPECTOMY;  Surgeon: Fanny Skates, MD;  Location: [place];  Service: General;  Laterality: Left;  . BREAST SURGERY    . BUNIONECTOMY     prior to 01/2006  . CARDIAC CATHETERIZATION    . CESAREAN SECTION  1979  . CHOLECYSTECTOMY  02/08/2006   DR. INGRAM  . COLONOSCOPY    . CORONARY STENT INTERVENTION N/A 01/01/2017   Procedure: CORONARY STENT INTERVENTION;  Surgeon: Jettie Booze, MD;  Location: [place] [facility];  Service: Cardiovascular;  Laterality: N/A;  . DENTAL SURGERY  2009-2016   13 dental implants  . LEFT HEART CATH AND CORONARY ANGIOGRAPHY N/A 01/01/2017   Procedure: LEFT HEART CATH AND CORONARY ANGIOGRAPHY;  Surgeon: Jettie Booze, MD;  Location: [place] [facility];  Service: Cardiovascular;  Laterality: N/A;  . LEG SURGERY  07/06/2004   Broken tibia  . NASAL SINUS SURGERY     X4; prior to 01/2006  . PORT-A-CATH REMOVAL Right 12/05/2015   Procedure: REMOVAL PORT-A-CATH;  Surgeon: Fanny Skates, MD;  Location: [place];  Service:  General;  Laterality: Right;  . PORTACATH PLACEMENT Left 09/08/2015   Procedure: INSERTION OF PORT-A-CATH ;  Surgeon: Fanny Skates, MD;  [facility];  Service: General;  Laterality: Left;    FAMILY HISTORY Family History  Problem Relation Age of Onset  . Hypertension Mother   . Heart disease Mother        d. 27  . Parkinsonism Father        d. 53  . Heart disease Sister   . Breast cancer Sister 68       s/p mastectomy; reportedly negative BRCA1/2 testing approx 7 years ago  . Melanoma Sister        d. 61s; hx of incresed sun exposure  . Cancer Sister        dx. sarcoma of her face in her 25s; treated w/ MOHS surgery  . Parkinsonism Paternal Aunt        d. 90s  . Parkinsonism Paternal Uncle        d. late 83s  . Kidney failure Maternal Grandmother 6  . Heart attack Maternal Grandfather        d. 49s  . Heart disease Paternal Grandmother        d. 54  . Breast cancer Other 58       maternal great aunt (MGF's sister)  . Kidney cancer Paternal Uncle 21       d. 105s; unsure about smoking status   The patient's father died at age 38 from complications of Parkinson's disease. The patient's mother died age 3 with heart disease. Clariza had no brothers. She had 2 sisters. One sister was diagnosed with breast cancer at age 51. She was tested for the BRCA gene and was negative. The same sister also had melanoma diagnosed in her 91s. The patient has one maternal great aunt diagnosed with breast cancer around age 80. There is no history of ovarian cancer in the family   GYNECOLOGIC HISTORY:  Patient's last menstrual period was 04/30/1996 (approximate). Menarche age 27, first live birth age 44. The patient is GX P2. She stopped having periods in her early 63s and took hormone replacement until her breast cancer diagnosis, April 2017.   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 He has been in remission for more than 10 years. Their daughter [name] lives in [place] were she is Garment/textile technologist of the [employer]. Daughter [name] is an infectious disease M.D. working and teaching at [employer] in [place]. The patient has 2 grandchildren. The family attends [employer]: Not in place   HEALTH MAINTENANCE: Social History   Tobacco Use  .  Smoking status: Former Smoker    Packs/day: 0.25    Years: 15.00    Pack years: 3.75    Types: Cigarettes  . Smokeless tobacco: Never Used  . Tobacco comment: social smoker/"on and off"  Vaping Use  . Vaping Use: Never used  Substance Use Topics  . Alcohol use: Yes    Alcohol/week: 0.0 standard drinks    Comment: social  . Drug use: No     Colonoscopy: 2016/ [facility]  PAP: 11/2018, negative  Bone density: October 2018, T-score: -2.7  Lipid panel:  Allergies  Allergen Reactions  . Chlorhexidine Other (See Comments)    [place] IS OK. The CLOTH WIPE causes reaction. Causes itching all over the area  . Other Rash    Adhesive tape    Current Outpatient Medications  Medication Sig Dispense Refill  . albuterol (PROVENTIL HFA;VENTOLIN HFA) 108 (90 Base) MCG/ACT inhaler Inhale 1-2 puffs into the lungs every 6 (six) hours as needed for wheezing or shortness of breath. Started in 1998    . amoxicillin-clavulanate (AUGMENTIN) 875-125 MG tablet Take 1 tablet by mouth 2 (two) times daily. 20 tablet 1  . anastrozole (ARIMIDEX) 1 MG tablet TAKE 1 TABLET(1 MG) BY MOUTH DAILY 90 tablet 4  . azithromycin (ZITHROMAX) 250 MG tablet Take 2 tablets day 1, then one tablet daily 6 tablet 0  . BRILINTA 90 MG TABS tablet TAKE 1 TABLET(90 MG) BY MOUTH TWICE DAILY 60 tablet 5  . buPROPion (WELLBUTRIN XL) 300 MG 24 hr tablet Take 300 mg by mouth daily.     . Calcium Carbonate-Vitamin D (CALCIUM + D PO) Take 1 tablet by  mouth at bedtime. Started approximately 1998    . carisoprodol (SOMA) 350 MG tablet Take 350 mg by mouth 2 (two) times daily as needed for muscle spasms. Started in 2008.  2  . cephALEXin (KEFLEX) 500 MG capsule Take 1 capsule (500 mg total) by mouth 2 (two) times daily. 14 capsule 0  . cephALEXin (KEFLEX) 500 MG capsule Take 1 capsule (500 mg total) by mouth 4 (four) times daily. 20 capsule 0  . chlorhexidine (PERIDEX) 0.12 % solution     . clindamycin (CLEOCIN) 300 MG capsule     . cycloSPORINE (RESTASIS) 0.05 % ophthalmic emulsion Place 1 drop into both eyes 2 (two) times daily. 30 mL 12  . diazepam (VALIUM) 5 MG tablet Take 5 mg by mouth every 6 (six) hours as needed for muscle spasms (interstitial cystitis). Started approximately 2008.    . fluconazole (DIFLUCAN) 100 MG tablet Take 1 tablet (100 mg total) by mouth daily. 30 tablet 0  . furosemide (LASIX) 20 MG tablet TAKE 1 TABLET(20 MG) BY MOUTH DAILY AS NEEDED FOR SHORTNESS OF BREATH 30 tablet 6  . gabapentin (NEURONTIN) 300 MG capsule TAKE 3 TO 4 CAPSULES(900 TO 1200 MG) BY MOUTH AT BEDTIME 120 capsule 4  . HYDROcodone-acetaminophen (NORCO) 10-325 MG tablet Take 1 tablet by mouth every 4 (four) hours as needed for moderate pain or severe pain (as needed for fibromyalgia and IC).     . hydrOXYzine (ATARAX/VISTARIL) 25 MG tablet Take 1 tablet by mouth at bedtime.    Marland Kitchen levothyroxine (SYNTHROID, LEVOTHROID) 75 MCG tablet Take 75 mcg by mouth daily.  2  . LORazepam (ATIVAN) 2 MG tablet Take 1 tablet by mouth daily as needed for anxiety.   5  . nitroGLYCERIN (NITROSTAT) 0.4 MG SL tablet PLACE 1 TABLET UNDER TONGUE EVERY FIVE MINUTES AS NEEDED FOR CHEST PAIN  25 tablet 3  . NONFORMULARY OR COMPOUNDED ITEM Vit E vaginal suppositories 265m/ml.  One pv three times weekly.  #36/4RF 36 each 4  . ondansetron (ZOFRAN) 8 MG tablet Take 1 tablet (8 mg total) by mouth every 8 (eight) hours as needed for nausea or vomiting. 20 tablet 1  . pantoprazole  (PROTONIX) 40 MG tablet Take 40 mg by mouth daily. Started in 2012.    . potassium chloride (K-DUR) 10 MEQ tablet TAKE 1 TABLET BY MOUTH AS NEEDED. TAKE WITH LASIX 30 tablet 11  . prochlorperazine (COMPAZINE) 10 MG tablet TAKE 1 TABLET(10 MG) BY MOUTH EVERY 6 HOURS AS NEEDED FOR NAUSEA OR VOMITING 385 tablet 1  . rosuvastatin (CRESTOR) 20 MG tablet TAKE 1 TABLET(20 MG) BY MOUTH DAILY 90 tablet 3  . terconazole (TERAZOL 7) 0.4 % vaginal cream Place 1 applicator vaginally at bedtime.    . Vaginal Lubricant (REPLENS VA) Place vaginally daily.    . Vitamin D, Ergocalciferol, (DRISDOL) 1.25 MG (50000 UT) CAPS capsule TAKE 1 CAPSULE BY MOUTH EVERY 7 DAYS 12 capsule 3  . zolpidem (AMBIEN) 10 MG tablet Take 10 mg by mouth at bedtime as needed for sleep.      No current facility-administered medications for this visit.     OBJECTIVE: white woman who appears well  Vitals:   01/15/20 1505  BP: 138/75  Pulse: 74  Resp: 18  Temp: (!) 97.3 F (36.3 C)  SpO2: 98%     Body mass index is 28.08 kg/m.    ECOG FS:1 - Symptomatic but completely ambulatory  Sclerae unicteric, EOMs intact Wearing a mask No cervical or supraclavicular adenopathy Lungs no rales or rhonchi Heart regular rate and rhythm Abd soft, nontender, positive bowel sounds MSK no focal spinal tenderness, no right upper extremity lymphedema; the right lower arm erythema is imaged below. Neuro: nonfocal, well oriented, appropriate affect Breasts: The right breast is status post lumpectomy and radiation with the expected skin changes.  In the axilla there is a small superficial scar which was previously noted and has not changed; the left breast and left axilla are benign  Right upper extremity 01/15/2020     Right breast 08/15/2016     Right Breast 02/03/2018      LAB RESULTS:  CMP     Component Value Date/Time   NA 141 01/15/2020 1457   NA 138 03/11/2017 1524   K 4.3 01/15/2020 1457   K 3.8 03/11/2017 1524   CL  106 01/15/2020 1457   CO2 28 01/15/2020 1457   CO2 25 03/11/2017 1524   GLUCOSE 114 (H) 01/15/2020 1457   GLUCOSE 136 03/11/2017 1524   BUN 16 01/15/2020 1457   BUN 13.2 03/11/2017 1524   CREATININE 0.89 01/15/2020 1457   CREATININE 0.83 02/24/2019 1224   CREATININE 0.8 03/11/2017 1524   CALCIUM 9.1 01/15/2020 1457   CALCIUM 9.6 03/11/2017 1524   PROT 7.0 01/15/2020 1457   PROT 7.6 03/11/2017 1524   ALBUMIN 3.7 01/15/2020 1457   ALBUMIN 3.8 03/11/2017 1524   AST 20 01/15/2020 1457   AST 19 02/24/2019 1224   AST 24 03/11/2017 1524   ALT 16 01/15/2020 1457   ALT 15 02/24/2019 1224   ALT 38 03/11/2017 1524   ALKPHOS 81 01/15/2020 1457   ALKPHOS 166 (H) 03/11/2017 1524   BILITOT 0.5 01/15/2020 1457   BILITOT 0.5 02/24/2019 1224   BILITOT 0.44 03/11/2017 1524   GFRNONAA >60 01/15/2020 1457   GFRNONAA >60 02/24/2019  [place] 01/15/2020 1457   GFRAA >60 02/24/2019 1224    INo results found for: SPEP, UPEP  Lab Results  Component Value Date   WBC 7.3 01/15/2020   NEUTROABS 5.3 01/15/2020   HGB 13.7 01/15/2020   HCT 41.1 01/15/2020   MCV 97.4 01/15/2020   PLT 218 01/15/2020      Chemistry      Component Value Date/Time   NA 141 01/15/2020 1457   NA 138 03/11/2017 1524   K 4.3 01/15/2020 1457   K 3.8 03/11/2017 1524   CL 106 01/15/2020 1457   CO2 28 01/15/2020 1457   CO2 25 03/11/2017 1524   BUN 16 01/15/2020 1457   BUN 13.2 03/11/2017 1524   CREATININE 0.89 01/15/2020 1457   CREATININE 0.83 02/24/2019 1224   CREATININE 0.8 03/11/2017 1524      Component Value Date/Time   CALCIUM 9.1 01/15/2020 1457   CALCIUM 9.6 03/11/2017 1524   ALKPHOS 81 01/15/2020 1457   ALKPHOS 166 (H) 03/11/2017 1524   AST 20 01/15/2020 1457   AST 19 02/24/2019 1224   AST 24 03/11/2017 1524   ALT 16 01/15/2020 1457   ALT 15 02/24/2019 1224   ALT 38 03/11/2017 1524   BILITOT 0.5 01/15/2020 1457   BILITOT 0.5 02/24/2019 1224   BILITOT 0.44 03/11/2017 1524       No  results found for: LABCA2  No components found for: LABCA125  No results for input(s): INR in the last 168 hours.  Urinalysis    Component Value Date/Time   COLORURINE YELLOW 01/03/2017 1205   APPEARANCEUR HAZY (A) 01/03/2017 1205   LABSPEC 1.006 01/03/2017 1205   PHURINE 7.0 01/03/2017 1205   GLUCOSEU NEGATIVE 01/03/2017 1205   HGBUR SMALL (A) 01/03/2017 1205   BILIRUBINUR NEGATIVE 01/03/2017 1205   BILIRUBINUR n 10/01/2016 1316   KETONESUR NEGATIVE 01/03/2017 1205   PROTEINUR NEGATIVE 01/03/2017 1205   UROBILINOGEN 0.2 10/01/2016 1316   NITRITE NEGATIVE 01/03/2017 1205   LEUKOCYTESUR LARGE (A) 01/03/2017 1205    ELIGIBLE FOR AVAILABLE RESEARCH PROTOCOL: PALLAS   STUDIES: No results found.   ASSESSMENT: 74 y.o. [place] woman status post right breast upper outer quadrant and right axillary lymph node biopsy 08/23/2015, both positive for a clinical T1 N2, stage IIIA invasive ductal carcinoma, grade 2, strongly estrogen receptor positive, 5% progesterone receptor positive, with an MIB-120-30% and no HER-2 amplification  (a) biopsy of a 0.9 cm Right breast satellite nodule 09/13/2015 showed invasive ductal carcinoma, grade 2, E-cadherin strongly positive, with a prognostic panel identical to the larger right upper quadrant mass  (1) left breast biopsy 08/24/2015 shows a complex sclerosing lesion, excised 12/05/2015  (2) neoadjuvant chemotherapy consisting of cyclophosphamide and docetaxel every 21 days 4, with OnPro support, started 09/12/2015, completed 11/14/2015  (3) status post right lumpectomy and sentinel lymph node dissection 12/05/2015 for a residual pT2 pN3, stage IIIC invasive ductal carcinoma, grade 2, with a close anterior margin (skin)  (4) adjuvant radiation 01/18/16 - 11/6/17with capecitabine chemo-sensitization   1) Right breast/nodal areas: 45 Gy in 25 fractions                         2) Right breast boost: 16 Gy in 8 fractions  (5) capecitabine started  04/13/2016, discontinued January 2018 with mild/moderate [name] effects developing  (6) anastrozole started 06/01/2016  (a) bone density January 30, 2017 shows a T score of -2.7 (osteoporosis)  (7) genetics  testing (Askenazi descent) 02/07/2016 through the [place] offered by [facility] Brianna Pigeon, MD) i found no deleterious mutations in  ATM, BARD1, BRCA1, BRCA2, BRIP1, CDH1, CHEK2, FANCC, MLH1, MSH2, MSH6, NBN, PALB2, PMS2, PTEN, RAD51C, RAD51D, TP53, and XRCC2.  This panel also includes deletion/duplication analysis (without sequencing) for one gene, EPCAM.  (8) consented to PALLAS trial   (a) randomized to anastrozole alone   PLAN: Aspynn is now 4 years out from definitive surgery for her breast cancer with no evidence of disease recurrence.  This is very favorable.  This was not a study visit but a visit to evaluate the right arm erythema.  As noted above, there is no swelling.  I think there is still is residual cellulitis and we are going to go with Augmentin.  She will call us in about a week to let us know if that is having any effect.  From the point of view of the study, there have been no adverse events related to the treatment.  She is scheduled for repeat mammography in October and she will see me a week later to discuss results.  Her next study appointment is already scheduled for late March.  Total encounter time 25 minutes.*  Arlie Posch, Virgie Dad, MD  01/15/20 8:31 PM Medical Oncology and Hematology [facility] [place], [place] [address] Tel. [phone]    Fax. [phone]   I, Wilburn Mylar, am acting as scribe for Dr. Virgie Dad. [name].  I, Lurline Del MD, have reviewed the above documentation for accuracy and completeness, and I agree with the above.   *Total Encounter Time as defined by the Centers for Medicare and Medicaid Services includes, in addition to the face-to-face time of a patient visit  (documented in the note above) non-face-to-face time: obtaining and reviewing outside history, ordering and reviewing medications, tests or procedures, care coordination (communications with other health care professionals or caregivers) and documentation in the medical record.

## 2020-01-15 NOTE — Research (Signed)
[place] visit  AFT-05 PALLAS - Month 42 follow-up visit Patient into clinic today unaccompanied for visit with Dr. Jana Hakim to follow up on redness right arm. This clinic visit coincides with the 42 months follow up time point on the PALLAS study.  Research nurse met with patient to collect data for this time point.  Anti-cancer medications - Patient states she is continuing to take anastrozole without interruption; she is on no new anti-cancer medications.   Serious adverse events - Patient denies any new, serious health problems since her last visit.   Disease monitoring - See MD note.  COVID-19 - Patient denies any COVID-19 infection and has not had any testing for COVID-19 since the pandemic began. Patient reports that she received her COVID-19 booster vaccine at [facility] on 12/13/19.  Patient is aware that next research visit is due in clinic March 2022 and already scheduled for 07/07/20. Patient agrees with this plan. Thanked patient for her participation in this research study. Foye Spurling, BSN, RN Clinical Research Nurse 01/15/2020 4:27 PM

## 2020-01-22 ENCOUNTER — Telehealth: Payer: Self-pay | Admitting: *Deleted

## 2020-01-22 MED ORDER — FLUCONAZOLE 100 MG PO TABS
100.0000 mg | ORAL_TABLET | Freq: Every day | ORAL | 0 refills | Status: DC
Start: 2020-01-22 — End: 2020-02-17

## 2020-01-22 NOTE — Telephone Encounter (Signed)
Prescription for diflucan sent to pt's phx per her call stating occurrence post use of antibiotic.

## 2020-02-01 ENCOUNTER — Other Ambulatory Visit: Payer: Self-pay | Admitting: Obstetrics & Gynecology

## 2020-02-01 NOTE — Telephone Encounter (Signed)
Medication refill request: Vitamin D Last AEX:  12/29/18 SM Next AEX: 03/04/20 Last MMG (if hormonal medication request): n/a Refill authorized: Today, please advise

## 2020-02-10 ENCOUNTER — Other Ambulatory Visit: Payer: Medicare Other

## 2020-02-10 ENCOUNTER — Ambulatory Visit (HOSPITAL_COMMUNITY): Payer: Medicare Other

## 2020-02-11 ENCOUNTER — Ambulatory Visit (INDEPENDENT_AMBULATORY_CARE_PROVIDER_SITE_OTHER): Payer: Medicare Other | Admitting: Interventional Cardiology

## 2020-02-11 ENCOUNTER — Other Ambulatory Visit: Payer: Self-pay

## 2020-02-11 ENCOUNTER — Encounter: Payer: Self-pay | Admitting: Interventional Cardiology

## 2020-02-11 VITALS — BP 146/72 | HR 66 | Ht 62.0 in | Wt 152.0 lb

## 2020-02-11 DIAGNOSIS — I252 Old myocardial infarction: Secondary | ICD-10-CM

## 2020-02-11 DIAGNOSIS — I5032 Chronic diastolic (congestive) heart failure: Secondary | ICD-10-CM | POA: Diagnosis not present

## 2020-02-11 DIAGNOSIS — I2510 Atherosclerotic heart disease of native coronary artery without angina pectoris: Secondary | ICD-10-CM

## 2020-02-11 DIAGNOSIS — E782 Mixed hyperlipidemia: Secondary | ICD-10-CM | POA: Diagnosis not present

## 2020-02-11 NOTE — Patient Instructions (Signed)
Medication Instructions:  Your physician recommends that you continue on your current medications as directed. Please refer to the Current Medication list given to you today.  *If you need a refill on your cardiac medications before your next appointment, please call your pharmacy*   Lab Work: Your physician recommends that you return for a FASTING lipid profile and liver function panel in December  If you have labs (blood work) drawn today and your tests are completely normal, you will receive your results only by: Marland Kitchen MyChart Message (if you have MyChart) OR . A paper copy in the mail If you have any lab test that is abnormal or we need to change your treatment, we will call you to review the results.   Testing/Procedures: None   Follow-Up: At [facility], you and your health needs are our priority.  As part of our continuing mission to provide you with exceptional heart care, we have created designated Provider Care Teams.  These Care Teams include your primary Cardiologist (physician) and Advanced Practice Providers (APPs -  Physician Assistants and Nurse Practitioners) who all work together to provide you with the care you need, when you need it.  We recommend signing up for the patient portal called "MyChart".  Sign up information is provided on this After Visit Summary.  MyChart is used to connect with patients for Virtual Visits (Telemedicine).  Patients are able to view lab/test results, encounter notes, upcoming appointments, etc.  Non-urgent messages can be sent to your provider as well.   To learn more about what you can do with MyChart, go to [email or web address].    Your next appointment:   12 month(s)  The format for your next appointment:   In Person  Provider:   You may see Larae Grooms, MD or one of the following Advanced Practice Providers on your designated Care Team:    Melina Copa, PA-C  Ermalinda Barrios, PA-C    Other Instructions None

## 2020-02-11 NOTE — Progress Notes (Signed)
Cardiology Office Note   Date:  02/11/2020   ID:  [name], DOB 10/02/1945, MRN [id number]  PCP:  Seward Carol, MD    No chief complaint on file.  CAD  Wt Readings from Last 3 Encounters:  02/11/20 152 lb (68.9 kg)  01/15/20 153 lb 8 oz (69.6 kg)  08/04/19 154 lb 12.8 oz (70.2 kg)       History of Present Illness: [name] is a 74 y.o. female   with history of CAD status postNSTEMI9/4/18 treated with DES 4 to the circumflex, mid LAD, mid RCA, and distal RCA.  Angina was more like indigestion, followed by pain in the center of chest, up to the jaw.  She worked at [employer] many years ago and knows Dr. Velora Heckler.    "The left ventricular systolic function is normal.  LV end diastolic pressure is normal.  The left ventricular ejection fraction is 55-65% by visual estimate.  There is no aortic valve stenosis.  Prox Cx to Mid Cx lesion, 95 %stenosed.  A STENT PROMUS PREM MR 2.75X16 drug eluting stent was successfully placed, postdilated to >23mm.  Post intervention, there is a 0% residual stenosis.  Mid LAD-2 lesion, 80 %stenosed.  A STENT PROMUS PREM MR 1.54M08 drug eluting stent was successfully placed, postdilated to >73mm.  Post intervention, there is a 0% residual stenosis.  Mid RCA lesion, 90 %stenosed.  A STENT PROMUS PREM MR 3.0X20 drug eluting stent was successfully placed, postdilated to 3.3 mm.  Post intervention, there is a 0% residual stenosis.  Dist RCA-2 lesion, 70 %stenosed.  A STENT PROMUS PREM MR 2.5X12 drug eluting stent was successfully placed. Post intervention, there is a 10% residual stenosis."  Patient has history of hypertension, breast CA status post lumpectomy chemotherapy and radiation, anxiety, fibromyalgia and hypothyroidism.  Seen on9/21/18 with shortness of breath and BNP was up to 415. She was given some Lasix to take for a week and breathing improved. She thought it could be asthma related also. 2-D echo 01/02/17  showed normal LVEF 60-65% with grade 1 DD.She went to [place].  Crestor increased to 20 mg daily in 2019.   Since the last visit, her husband developed dementia.  She had a lot of stress due to increased responsibilities.  Denies : exertional Chest pain. Dizziness. Leg edema. Nitroglycerin use. Orthopnea.  Paroxysmal nocturnal dyspnea. Shortness of breath. Syncope.   With stress, she has had palpitations and some chest tightness.  It resolves with relaxation.   No problems with walking distances.   Due to LDL 82, Crestor was increased to 40 mg daily.  She is tolerating this well.     Past Medical History:  Diagnosis Date   Anxiety    Arthritis 2008   Asthma 1953   age 81   Breast cancer ([place]) dx'd 2017   right   Cancer [facility])    breast cancer   Chronic vaginitis 1998   Fibromyalgia 1982   GERD (gastroesophageal reflux disease)    since at least 2011   Hemorrhoids 2009   History of radiation therapy 01/18/16-03/05/16   right breast/nodal areas 45 Gy, right breast boost 16 Gy   Hypoglycemia 2003   Hypothyroidism 1964   age 2   Interstitial cystitis 1998   Irritable bowel syndrome 06/14/2009   Qualifier: Diagnosis of  By: Patsy Baltimore RN, Denise     Menopausal symptoms    Osteoporosis 12/24/2006   Personal history of chemotherapy    Personal history  of radiation therapy    Pneumonia 1979   varicella   PONV (postoperative nausea and vomiting) 1979    Past Surgical History:  Procedure Laterality Date   BLADDER HYPEREXTENSION     X4, prior to 01/2006   BLADDER SUSPENSION     BREAST BIOPSY     BREAST LUMPECTOMY Right    2017   BREAST LUMPECTOMY WITH NEEDLE LOCALIZATION AND AXILLARY LYMPH NODE DISSECTION Right 12/05/2015   Procedure: RIGHT BREAST NEEDLE LOCALIZED (WIRES X 2) LUMPECTOMY WITH AXILLARY LYMPH NODE DISSECTION;  Surgeon: Fanny Skates, MD;  Location: [place];  Service: General;  Laterality: Right;   BREAST LUMPECTOMY WITH RADIOACTIVE SEED  LOCALIZATION Left 12/05/2015   Procedure: RADIOACTIVE SEED GUIDED LEFT BREAST LUMPECTOMY;  Surgeon: Fanny Skates, MD;  Location: [place];  Service: General;  Laterality: Left;   BREAST SURGERY     BUNIONECTOMY     prior to 01/2006   [place]  02/08/2006   DR. INGRAM   COLONOSCOPY     CORONARY STENT INTERVENTION N/A 01/01/2017   Procedure: CORONARY STENT INTERVENTION;  Surgeon: Jettie Booze, MD;  Location: [place] [facility];  Service: Cardiovascular;  Laterality: N/A;   DENTAL SURGERY  2009-2016   13 dental implants   LEFT HEART CATH AND CORONARY ANGIOGRAPHY N/A 01/01/2017   Procedure: LEFT HEART CATH AND CORONARY ANGIOGRAPHY;  Surgeon: Jettie Booze, MD;  Location: [place] [facility];  Service: Cardiovascular;  Laterality: N/A;   LEG SURGERY  07/06/2004   Broken tibia   NASAL SINUS SURGERY     X4; prior to 01/2006   [facility] REMOVAL Right 12/05/2015   Procedure: REMOVAL PORT-A-CATH;  Surgeon: Fanny Skates, MD;  Location: [place];  Service: General;  Laterality: Right;   PORTACATH PLACEMENT Left 09/08/2015   Procedure: INSERTION OF PORT-A-CATH ;  Surgeon: Fanny Skates, MD;  Location: [place];  Service: General;  Laterality: Left;     Current Outpatient Medications  Medication Sig Dispense Refill   albuterol (PROVENTIL HFA;VENTOLIN HFA) 108 (90 Base) MCG/ACT inhaler Inhale 1-2 puffs into the lungs every 6 (six) hours as needed for wheezing or shortness of breath. Started in 1998     anastrozole (ARIMIDEX) 1 MG tablet TAKE 1 TABLET(1 MG) BY MOUTH DAILY 90 tablet 4   BRILINTA 90 MG TABS tablet TAKE 1 TABLET(90 MG) BY MOUTH TWICE DAILY 60 tablet 5   buPROPion (WELLBUTRIN XL) 300 MG 24 hr tablet Take 300 mg by mouth daily.      Calcium Carbonate-Vitamin D (CALCIUM + D PO) Take 1 tablet by mouth at bedtime. Started approximately 1998     carisoprodol (SOMA) 350 MG tablet Take 350 mg by mouth 2 (two)  times daily as needed for muscle spasms. Started in 2008.  2   diazepam (VALIUM) 5 MG tablet Take 5 mg by mouth every 6 (six) hours as needed for muscle spasms (interstitial cystitis). Started approximately 2008.     fluconazole (DIFLUCAN) 100 MG tablet Take 1 tablet (100 mg total) by mouth daily. 7 tablet 0   furosemide (LASIX) 20 MG tablet TAKE 1 TABLET(20 MG) BY MOUTH DAILY AS NEEDED FOR SHORTNESS OF BREATH 30 tablet 6   gabapentin (NEURONTIN) 300 MG capsule TAKE 3 TO 4 CAPSULES(900 TO 1200 MG) BY MOUTH AT BEDTIME 120 capsule 4   HYDROcodone-acetaminophen (NORCO) 10-325 MG tablet Take 1 tablet by mouth every 4 (four) hours as needed for moderate  pain or severe pain (as needed for fibromyalgia and IC).      hydrOXYzine (ATARAX/VISTARIL) 25 MG tablet Take 1 tablet by mouth at bedtime.     levothyroxine (SYNTHROID, LEVOTHROID) 75 MCG tablet Take 75 mcg by mouth daily.  2   LORazepam (ATIVAN) 2 MG tablet Take 1 tablet by mouth daily as needed for anxiety.   5   nitroGLYCERIN (NITROSTAT) 0.4 MG SL tablet PLACE 1 TABLET UNDER TONGUE EVERY FIVE MINUTES AS NEEDED FOR CHEST PAIN 25 tablet 3   NONFORMULARY OR COMPOUNDED ITEM Vit E vaginal suppositories 200mg /ml.  One pv three times weekly.  #36/4RF 36 each 4   ondansetron (ZOFRAN) 8 MG tablet Take 1 tablet (8 mg total) by mouth every 8 (eight) hours as needed for nausea or vomiting. 20 tablet 1   pantoprazole (PROTONIX) 40 MG tablet Take 40 mg by mouth daily. Started in 2012.     potassium chloride (K-DUR) 10 MEQ tablet TAKE 1 TABLET BY MOUTH AS NEEDED. TAKE WITH LASIX 30 tablet 11   prochlorperazine (COMPAZINE) 10 MG tablet TAKE 1 TABLET(10 MG) BY MOUTH EVERY 6 HOURS AS NEEDED FOR NAUSEA OR VOMITING 385 tablet 1   rosuvastatin (CRESTOR) 20 MG tablet TAKE 1 TABLET(20 MG) BY MOUTH DAILY (Patient taking differently: Take 40 mg by mouth daily. TAKE 1 TABLET(20 MG) BY MOUTH DAILY) 90 tablet 3   terconazole (TERAZOL 7) 0.4 % vaginal cream Place 1  applicator vaginally at bedtime.     Vaginal Lubricant (REPLENS VA) Place vaginally daily.     Vitamin D, Ergocalciferol, (DRISDOL) 1.25 MG (50000 UNIT) CAPS capsule TAKE 1 CAPSULE BY MOUTH EVERY 7 DAYS 12 capsule 0   zolpidem (AMBIEN) 10 MG tablet Take 10 mg by mouth at bedtime as needed for sleep.      No current facility-administered medications for this visit.    Allergies:   Chlorhexidine and Other    Social History:  The patient  reports that she has quit smoking. Her smoking use included cigarettes. She has a 3.75 pack-year smoking history. She has never used smokeless tobacco. She reports current alcohol use. She reports that she does not use drugs.   Family History:  The patient's family history includes Breast cancer (age of onset: 41) in her sister; Breast cancer (age of onset: 47) in an other family member; Cancer in her sister; Heart attack in her maternal grandfather; Heart disease in her mother, paternal grandmother, and sister; Hypertension in her mother; Kidney cancer (age of onset: 63) in her paternal uncle; Kidney failure (age of onset: 17) in her maternal grandmother; Melanoma in her sister; Parkinsonism in her father, paternal aunt, and paternal uncle.    ROS:  Please see the history of present illness.   Otherwise, review of systems are positive for stress.   All other systems are reviewed and negative.    PHYSICAL EXAM: VS:  BP (!) 146/72    Pulse 66    Ht 5\' 2"  (1.575 m)    Wt 152 lb (68.9 kg)    LMP 04/30/1996 (Approximate)    SpO2 95%    BMI 27.80 kg/m  , BMI Body mass index is 27.8 kg/m. GEN: Well nourished, well developed, in no acute distress  HEENT: normal  Neck: no JVD, carotid bruits, or masses Cardiac: RRR; no murmurs, rubs, or gallops,no edema  Respiratory:  clear to auscultation bilaterally, normal work of breathing GI: soft, nontender, nondistended, + BS MS: no deformity or atrophy  Skin: warm  and dry, no rash Neuro:  Strength and sensation are  intact Psych: euthymic mood, full affect   EKG:   The ekg ordered today demonstrates NSR, ST flattening less pronounced than 2020 ECG   Recent Labs: 01/15/2020: ALT 16; BUN 16; Creatinine, Ser 0.89; Hemoglobin 13.7; Platelets 218; Potassium 4.3; Sodium 141   Lipid Panel    Component Value Date/Time   CHOL 189 01/03/2017 0402   TRIG 83 01/03/2017 0402   HDL 67 01/03/2017 0402   CHOLHDL 2.8 01/03/2017 0402   VLDL 17 01/03/2017 0402   LDLCALC 105 (H) 01/03/2017 0402     Other studies Reviewed: Additional studies/ records that were reviewed today with results demonstrating: HDL 97, LDL 82 in 8/21.     ASSESSMENT AND PLAN:  1. CAD/Old MI: No exertional angina. Trying to manage stress. Continue aggressive secondary prevention.  2. HTN: The current medical regimen is effective;  continue present plan and medications.   3. Chronic diastolic heart failure: Appears euvolemic.  Using Lasix prn but has not needed for some time.  4. Hyperlipidemia: Recheck lipids in 12/21. COntineu Crestor 40 mg daily.     Current medicines are reviewed at length with the patient today.  The patient concerns regarding her medicines were addressed.  The following changes have been made:  No change  Labs/ tests ordered today include:  No orders of the defined types were placed in this encounter.   Recommend 150 minutes/week of aerobic exercise Low fat, low carb, high fiber diet recommended  Disposition:   FU in 1 year   Signed, Larae Grooms, MD  02/11/2020 10:04 AM    [facility] [place], [place], [place]  [address] Phone: [phone]; Fax: [phone]

## 2020-02-16 ENCOUNTER — Other Ambulatory Visit: Payer: Self-pay

## 2020-02-16 ENCOUNTER — Ambulatory Visit
Admission: RE | Admit: 2020-02-16 | Discharge: 2020-02-16 | Disposition: A | Discharge status: Home / Self Care | Payer: Medicare Other | Source: Ambulatory Visit | Attending: Oncology | Admitting: Oncology

## 2020-02-16 ENCOUNTER — Other Ambulatory Visit: Payer: Self-pay | Admitting: Oncology

## 2020-02-16 DIAGNOSIS — Z170 Estrogen receptor positive status [ER+]: Secondary | ICD-10-CM

## 2020-02-16 DIAGNOSIS — R2231 Localized swelling, mass and lump, right upper limb: Secondary | ICD-10-CM

## 2020-02-17 ENCOUNTER — Other Ambulatory Visit: Payer: Self-pay

## 2020-02-17 ENCOUNTER — Inpatient Hospital Stay: Payer: Medicare Other

## 2020-02-17 ENCOUNTER — Inpatient Hospital Stay: Payer: Medicare Other | Attending: Oncology | Admitting: Oncology

## 2020-02-17 VITALS — BP 148/73 | HR 64 | Temp 97.6°F | Resp 18 | Ht 62.0 in | Wt 154.3 lb

## 2020-02-17 DIAGNOSIS — L539 Erythematous condition, unspecified: Secondary | ICD-10-CM | POA: Diagnosis not present

## 2020-02-17 DIAGNOSIS — C773 Secondary and unspecified malignant neoplasm of axilla and upper limb lymph nodes: Secondary | ICD-10-CM | POA: Diagnosis not present

## 2020-02-17 DIAGNOSIS — C50411 Malignant neoplasm of upper-outer quadrant of right female breast: Secondary | ICD-10-CM | POA: Diagnosis present

## 2020-02-17 DIAGNOSIS — Z170 Estrogen receptor positive status [ER+]: Secondary | ICD-10-CM | POA: Insufficient documentation

## 2020-02-17 DIAGNOSIS — I2510 Atherosclerotic heart disease of native coronary artery without angina pectoris: Secondary | ICD-10-CM

## 2020-02-17 DIAGNOSIS — M810 Age-related osteoporosis without current pathological fracture: Secondary | ICD-10-CM | POA: Diagnosis not present

## 2020-02-17 DIAGNOSIS — Z9221 Personal history of antineoplastic chemotherapy: Secondary | ICD-10-CM | POA: Diagnosis not present

## 2020-02-17 DIAGNOSIS — Z87891 Personal history of nicotine dependence: Secondary | ICD-10-CM | POA: Insufficient documentation

## 2020-02-17 DIAGNOSIS — Z923 Personal history of irradiation: Secondary | ICD-10-CM | POA: Diagnosis not present

## 2020-02-17 LAB — CBC WITH DIFFERENTIAL/PLATELET
Abs Immature Granulocytes: 0.02 10*3/uL (ref 0.00–0.07)
Basophils Absolute: 0 10*3/uL (ref 0.0–0.1)
Basophils Relative: 0 %
Eosinophils Absolute: 0.1 10*3/uL (ref 0.0–0.5)
Eosinophils Relative: 2 %
HCT: 40.8 % (ref 36.0–46.0)
Hemoglobin: 13.9 g/dL (ref 12.0–15.0)
Immature Granulocytes: 0 %
Lymphocytes Relative: 23 %
Lymphs Abs: 1.7 10*3/uL (ref 0.7–4.0)
MCH: 32 pg (ref 26.0–34.0)
MCHC: 34.1 g/dL (ref 30.0–36.0)
MCV: 93.8 fL (ref 80.0–100.0)
Monocytes Absolute: 0.4 10*3/uL (ref 0.1–1.0)
Monocytes Relative: 6 %
Neutro Abs: 5 10*3/uL (ref 1.7–7.7)
Neutrophils Relative %: 69 %
Platelets: 209 10*3/uL (ref 150–400)
RBC: 4.35 MIL/uL (ref 3.87–5.11)
RDW: 13.2 % (ref 11.5–15.5)
WBC: 7.3 10*3/uL (ref 4.0–10.5)
nRBC: 0 % (ref 0.0–0.2)

## 2020-02-17 LAB — COMPREHENSIVE METABOLIC PANEL
ALT: 11 U/L (ref 0–44)
AST: 18 U/L (ref 15–41)
Albumin: 3.7 g/dL (ref 3.5–5.0)
Alkaline Phosphatase: 77 U/L (ref 38–126)
Anion gap: 5 (ref 5–15)
BUN: 13 mg/dL (ref 8–23)
CO2: 29 mmol/L (ref 22–32)
Calcium: 9.4 mg/dL (ref 8.9–10.3)
Chloride: 104 mmol/L (ref 98–111)
Creatinine, Ser: 0.86 mg/dL (ref 0.44–1.00)
GFR, Estimated: >60 mL/min (ref >60)
Glucose, Bld: 94 mg/dL (ref 70–99)
Potassium: 4.3 mmol/L (ref 3.5–5.1)
Sodium: 138 mmol/L (ref 135–145)
Total Bilirubin: 0.4 mg/dL (ref 0.3–1.2)
Total Protein: 7.1 g/dL (ref 6.5–8.1)

## 2020-02-17 MED ORDER — FLUCONAZOLE 100 MG PO TABS
100.0000 mg | ORAL_TABLET | Freq: Every day | ORAL | 0 refills | Status: DC
Start: 2020-02-17 — End: 2020-03-04

## 2020-02-17 MED ORDER — PROCHLORPERAZINE MALEATE 10 MG PO TABS: 10.0000 mg | ORAL_TABLET | Freq: Four times a day (QID) | ORAL | 1 refills | Status: DC | PRN

## 2020-02-17 MED ORDER — CEPHALEXIN 500 MG PO CAPS: 500.0000 mg | ORAL_CAPSULE | Freq: Two times a day (BID) | ORAL | 1 refills | Status: DC

## 2020-02-17 NOTE — Progress Notes (Signed)
[place]  [phone]) [phone] [phone]) [phone]     ID: [name] DOB: 02/03/1946  MR#: [id number]  LPF#:[id number]  Patient Care Team: Seward Carol, MD as PCP - General (Internal Medicine) Jettie Booze, MD as PCP - Cardiology (Cardiology) Debra Calabretta, Virgie Dad, MD as Consulting Physician (Oncology) Fanny Skates, MD as Consulting Physician (General Surgery) Luberta Robertson, MD (Radiology) Hennie Duos, MD as Consulting Physician (Rheumatology) Domingo Pulse, MD (Urology) Druscilla Brownie, MD as Consulting Physician (Dermatology) Luberta Mutter, MD as Consulting Physician (Ophthalmology) Magnus Sinning, MD as Consulting Physician (Physical Medicine and Rehabilitation) Megan Salon, MD as Consulting Physician (Gynecology) Loney Loh, MD (Dermatology) OTHER MD:   CHIEF COMPLAINT: Estrogen receptor positive breast cancer  CURRENT TREATMENT:  Anastrozole; PALLAS trial (observation)   INTERVAL HISTORY: [name] returns today for follow-up of her estrogen receptor positive breast cancer.    Since her last visit, she presented for her annual mammography with a palpable lower right axilla mass. She underwent bilateral diagnostic mammography with tomography and right axilla ultrasonography at [facility] yesterday, 02/16/2020, showing: breast density category B; no evidence of malignancy in either breast; benign intradermal inclusion cysts in posterior right axilla.   She continues on anastrozole.  She tolerates this with no significant [name] effect.  [name]'s last bone density screening on 01/30/2017, showed a T-score of -2.7, which is considered osteoporotic.    REVIEW OF SYSTEMS: [name] continues to have erythema of the right lower arm and some swelling.  She says she does have a sleeve and she does wear it "most of the time".  However she has not worn it at any of the visits here.  She is still having some problems with nausea  and is running out of Compazine she says.  She is very concerned about the health situation involving her husband [name].  Detailed review of systems was otherwise stable   BREAST CANCER HISTORY: From the original intake note:  [name] had routine mammographic screening in 08/18/2015 at the [facility], showing a right breast mass and possible axillary adenopathy. On 08/23/2015 she underwent bilateral diagnostic mammography with tomography and bilateral breast ultrasonography. The breast density was category B in the right breast there was a spiculated mass in the upper-outer quadrant measuring 1.2 cm and associated with pleomorphic calcifications. There were calcifications extending anteriorly and posteriorly from the mass worrisome for ductal carcinoma in situ. The total area in question is about 3 cm. There were also multiple enlarged right axillary lymph nodes. The mass was palpated as an area of fullness at the 12:00 position of the breast 3 cm from the nipple. There were palpable enlarged right axillary lymph nodes in the right axilla. Ultrasonography of the right breast confirmed an irregular hypoechoic mass in the area in question measuring 1.6 cm. There were multiple abnormal appearing enlarged right axillary lymph nodes, the largest measuring 2.3 cm.  In the left breast, mammography showed an area of architectural distortion associated with the left nipple. This was not palpable. Ultrasonography of the left breast found no correlate. This area of the left breast was biopsied under tomography 08/24/2015. This showed (SAA [id number]) a complex sclerosing lesion and fibroadenoma.  On 08/23/2015 the patient underwent biopsy of the right breast mass in 1 of the abnormal right axillary lymph nodes. Both were positive for invasive ductal carcinoma, grade 2 both estrogen receptor 100% positive and both progesterone receptor 5% positive with MIB-1 of 20-30% (the higher 1 obtained from the  lymph node). Both  biopsies were HER-2 nonamplified, the breast being ratio 1.27 and number per cell 1.90, and the lymph node 1 ratio 1.24 and number per cell 3.0.  On 08/26/2015 the patient underwent bilateral breast MRI. In the left breast there was no suspicious enhancement. The left axilla was benign. In the right breast there was a mass described as in the upper inner quadrant measuring 2.3 cm. Less than a centimeter away from this mass there was a satellite nodule measuring 0.9 cm. In the right axilla there were multiple enlarged right axillary lymph nodes, including a confluent region measuring up to 4 cm. There were also enlarged level II lymph nodes present measuring up to 2.1 cm.  The patient's subsequent history is as detailed below   PAST MEDICAL HISTORY: Past Medical History:  Diagnosis Date  . Anxiety   . Arthritis 2008  . Asthma 1953   age 45  . Breast cancer ([facility] [place]) dx'd 2017   right  . Cancer [facility])    breast cancer  . Chronic vaginitis 1998  . Fibromyalgia 1982  . GERD (gastroesophageal reflux disease)    since at least 2011  . Hemorrhoids 2009  . History of radiation therapy 01/18/16-03/05/16   right breast/nodal areas 45 Gy, right breast boost 16 Gy  . Hypoglycemia 2003  . Hypothyroidism 1964   age 66  . Interstitial cystitis 1998  . Irritable bowel syndrome 06/14/2009   Qualifier: Diagnosis of  By: Patsy Baltimore RN, Langley Gauss    . Menopausal symptoms   . Osteoporosis 12/24/2006  . Personal history of chemotherapy   . Personal history of radiation therapy   . Pneumonia 1979   varicella  . PONV (postoperative nausea and vomiting) 1979    PAST SURGICAL HISTORY: Past Surgical History:  Procedure Laterality Date  . BLADDER HYPEREXTENSION     X4, prior to 01/2006  . BLADDER SUSPENSION    . BREAST BIOPSY    . BREAST LUMPECTOMY Right    2017  . BREAST LUMPECTOMY WITH NEEDLE LOCALIZATION AND AXILLARY LYMPH NODE DISSECTION Right 12/05/2015   Procedure: RIGHT BREAST NEEDLE LOCALIZED (WIRES X  2) LUMPECTOMY WITH AXILLARY LYMPH NODE DISSECTION;  Surgeon: Fanny Skates, MD;  Location: [place];  Service: General;  Laterality: Right;  . BREAST LUMPECTOMY WITH RADIOACTIVE SEED LOCALIZATION Left 12/05/2015   Procedure: RADIOACTIVE SEED GUIDED LEFT BREAST LUMPECTOMY;  Surgeon: Fanny Skates, MD;  Location: [place];  Service: General;  Laterality: Left;  . BREAST SURGERY    . BUNIONECTOMY     prior to 01/2006  . CARDIAC CATHETERIZATION    . CESAREAN SECTION  1979  . CHOLECYSTECTOMY  02/08/2006   DR. INGRAM  . COLONOSCOPY    . CORONARY STENT INTERVENTION N/A 01/01/2017   Procedure: CORONARY STENT INTERVENTION;  Surgeon: Jettie Booze, MD;  Location: [place] CV LAB;  Service: Cardiovascular;  Laterality: N/A;  . DENTAL SURGERY  2009-2016   13 dental implants  . LEFT HEART CATH AND CORONARY ANGIOGRAPHY N/A 01/01/2017   Procedure: LEFT HEART CATH AND CORONARY ANGIOGRAPHY;  Surgeon: Jettie Booze, MD;  Location: [place] [facility];  Service: Cardiovascular;  Laterality: N/A;  . LEG SURGERY  07/06/2004   Broken tibia  . NASAL SINUS SURGERY     X4; prior to 01/2006  . PORT-A-CATH REMOVAL Right 12/05/2015   Procedure: REMOVAL PORT-A-CATH;  Surgeon: Fanny Skates, MD;  Location: [place];  Service: General;  Laterality: Right;  . PORTACATH PLACEMENT Left 09/08/2015  Procedure: INSERTION OF PORT-A-CATH ;  Surgeon: Claud Kelp, MD;  Location: [facility] OR;  Service: General;  Laterality: Left;    FAMILY HISTORY Family History  Problem Relation Age of Onset  . Hypertension Mother   . Heart disease Mother        d. 17  . Parkinsonism Father        d. 29  . Heart disease Sister   . Breast cancer Sister 9       s/p mastectomy; reportedly negative BRCA1/2 testing approx 7 years ago  . Melanoma Sister        d. 74s; hx of incresed sun exposure  . Cancer Sister        dx. sarcoma of her face in her 60s; treated w/ MOHS surgery  . Parkinsonism Paternal Aunt        d. 90s  .  Parkinsonism Paternal Uncle        d. late 7s  . Kidney failure Maternal Grandmother 23  . Heart attack Maternal Grandfather        d. 39s  . Heart disease Paternal Grandmother        d. 64  . Breast cancer Other 76       maternal great aunt (MGF's sister)  . Kidney cancer Paternal Uncle 50       d. 7s; unsure about smoking status   The patient's father died at age 46 from complications of Parkinson's disease. The patient's mother died age 49 with heart disease. Angelyna had no brothers. She had 2 sisters. One sister was diagnosed with breast cancer at age 76. She was tested for the BRCA gene and was negative. The same sister also had melanoma diagnosed in her 4s. The patient has one maternal great aunt diagnosed with breast cancer around age 10. There is no history of ovarian cancer in the family   GYNECOLOGIC HISTORY:  Patient's last menstrual period was 04/30/1996 (approximate). Menarche age 64, first live birth age 4. The patient is GX P2. She stopped having periods in her early 65s and took hormone replacement until her breast cancer diagnosis, April 2017.   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 [name] has a history of non-Hodgkin's lymphoma and is status post first auto and then allo transplants. He has been in remission for more than 10 years. Their daughter Herbert Seta lives in [place] were she is Control and instrumentation engineer of the [employer]. Daughter Natalia Leatherwood is an infectious disease M.D. working and teaching at [employer] in [place]. The patient has 2 grandchildren. The family attends [employer]    ADVANCED DIRECTIVES: Not in place   HEALTH MAINTENANCE: Social History   Tobacco Use  . Smoking status: Former Smoker    Packs/day: 0.25    Years: 15.00    Pack years: 3.75    Types: Cigarettes  . Smokeless tobacco: Never Used  . Tobacco comment: social smoker/"on and off"    Vaping Use  . Vaping Use: Never used  Substance Use Topics  . Alcohol use: Yes    Alcohol/week: 0.0 standard drinks    Comment: social  . Drug use: No     Colonoscopy: 2016/ [facility]  PAP: 11/2018, negative  Bone density: October 2018, T-score: -2.7  Lipid panel:  Allergies  Allergen Reactions  . Chlorhexidine Other (See Comments)    WASH IS OK. The CLOTH WIPE causes reaction. Causes itching all  over the area  . Other Rash    Adhesive tape    Current Outpatient Medications  Medication Sig Dispense Refill  . albuterol (PROVENTIL HFA;VENTOLIN HFA) 108 (90 Base) MCG/ACT inhaler Inhale 1-2 puffs into the lungs every 6 (six) hours as needed for wheezing or shortness of breath. Started in 1998    . anastrozole (ARIMIDEX) 1 MG tablet TAKE 1 TABLET(1 MG) BY MOUTH DAILY 90 tablet 4  . BRILINTA 90 MG TABS tablet TAKE 1 TABLET(90 MG) BY MOUTH TWICE DAILY 60 tablet 5  . buPROPion (WELLBUTRIN XL) 300 MG 24 hr tablet Take 300 mg by mouth daily.     . Calcium Carbonate-Vitamin D (CALCIUM + D PO) Take 1 tablet by mouth at bedtime. Started approximately 1998    . carisoprodol (SOMA) 350 MG tablet Take 350 mg by mouth 2 (two) times daily as needed for muscle spasms. Started in 2008.  2  . cephALEXin (KEFLEX) 500 MG capsule Take 1 capsule (500 mg total) by mouth 2 (two) times daily. 30 capsule 1  . diazepam (VALIUM) 5 MG tablet Take 5 mg by mouth every 6 (six) hours as needed for muscle spasms (interstitial cystitis). Started approximately 2008.    . fluconazole (DIFLUCAN) 100 MG tablet Take 1 tablet (100 mg total) by mouth daily. Stop when infection resolves and restart when infection resumes 30 tablet 0  . furosemide (LASIX) 20 MG tablet TAKE 1 TABLET(20 MG) BY MOUTH DAILY AS NEEDED FOR SHORTNESS OF BREATH 30 tablet 6  . gabapentin (NEURONTIN) 300 MG capsule TAKE 3 TO 4 CAPSULES(900 TO 1200 MG) BY MOUTH AT BEDTIME 120 capsule 4  . HYDROcodone-acetaminophen (NORCO) 10-325 MG tablet Take 1 tablet by  mouth every 4 (four) hours as needed for moderate pain or severe pain (as needed for fibromyalgia and IC).     . hydrOXYzine (ATARAX/VISTARIL) 25 MG tablet Take 1 tablet by mouth at bedtime.    Marland Kitchen levothyroxine (SYNTHROID, LEVOTHROID) 75 MCG tablet Take 75 mcg by mouth daily.  2  . LORazepam (ATIVAN) 2 MG tablet Take 1 tablet by mouth daily as needed for anxiety.   5  . nitroGLYCERIN (NITROSTAT) 0.4 MG SL tablet PLACE 1 TABLET UNDER TONGUE EVERY FIVE MINUTES AS NEEDED FOR CHEST PAIN 25 tablet 3  . NONFORMULARY OR COMPOUNDED ITEM Vit E vaginal suppositories 200mg /ml.  One pv three times weekly.  #36/4RF 36 each 4  . ondansetron (ZOFRAN) 8 MG tablet Take 1 tablet (8 mg total) by mouth every 8 (eight) hours as needed for nausea or vomiting. 20 tablet 1  . pantoprazole (PROTONIX) 40 MG tablet Take 40 mg by mouth daily. Started in 2012.    . potassium chloride (K-DUR) 10 MEQ tablet TAKE 1 TABLET BY MOUTH AS NEEDED. TAKE WITH LASIX 30 tablet 11  . prochlorperazine (COMPAZINE) 10 MG tablet TAKE 1 TABLET(10 MG) BY MOUTH EVERY 6 HOURS AS NEEDED FOR NAUSEA OR VOMITING 385 tablet 1  . rosuvastatin (CRESTOR) 20 MG tablet TAKE 1 TABLET(20 MG) BY MOUTH DAILY (Patient taking differently: Take 40 mg by mouth daily. TAKE 1 TABLET(20 MG) BY MOUTH DAILY) 90 tablet 3  . terconazole (TERAZOL 7) 0.4 % vaginal cream Place 1 applicator vaginally at bedtime.    . Vaginal Lubricant (REPLENS VA) Place vaginally daily.    . Vitamin D, Ergocalciferol, (DRISDOL) 1.25 MG (50000 UNIT) CAPS capsule TAKE 1 CAPSULE BY MOUTH EVERY 7 DAYS 12 capsule 0  . zolpidem (AMBIEN) 10 MG tablet Take 10 mg by  mouth at bedtime as needed for sleep.      No current facility-administered medications for this visit.    OBJECTIVE: white woman who appears younger than stated age  Vitals:   02/17/20 1543  BP: (!) 148/73  Pulse: 64  Resp: 18  Temp: 97.6 F (36.4 C)  SpO2: 96%     Body mass index is 28.22 kg/m.    ECOG FS:1 - Symptomatic but  completely ambulatory  Sclerae unicteric, EOMs intact Wearing a mask No cervical or supraclavicular adenopathy Lungs no rales or rhonchi Heart regular rate and rhythm Abd soft, nontender, positive bowel sounds MSK no focal spinal tenderness, no upper extremity lymphedema Neuro: nonfocal, well oriented, appropriate affect Breasts: Deferred Skin: There is significant erythema in the right forearm area, with mild lymphedema.   Right upper extremity 01/15/2020     Right breast 08/15/2016     Right Breast 02/03/2018      LAB RESULTS:  CMP     Component Value Date/Time   NA 138 02/17/2020 1452   NA 138 03/11/2017 1524   K 4.3 02/17/2020 1452   K 3.8 03/11/2017 1524   CL 104 02/17/2020 1452   CO2 29 02/17/2020 1452   CO2 25 03/11/2017 1524   GLUCOSE 94 02/17/2020 1452   GLUCOSE 136 03/11/2017 1524   BUN 13 02/17/2020 1452   BUN 13.2 03/11/2017 1524   CREATININE 0.86 02/17/2020 1452   CREATININE 0.83 02/24/2019 1224   CREATININE 0.8 03/11/2017 1524   CALCIUM 9.4 02/17/2020 1452   CALCIUM 9.6 03/11/2017 1524   PROT 7.1 02/17/2020 1452   PROT 7.6 03/11/2017 1524   ALBUMIN 3.7 02/17/2020 1452   ALBUMIN 3.8 03/11/2017 1524   AST 18 02/17/2020 1452   AST 19 02/24/2019 1224   AST 24 03/11/2017 1524   ALT 11 02/17/2020 1452   ALT 15 02/24/2019 1224   ALT 38 03/11/2017 1524   ALKPHOS 77 02/17/2020 1452   ALKPHOS 166 (H) 03/11/2017 1524   BILITOT 0.4 02/17/2020 1452   BILITOT 0.5 02/24/2019 1224   BILITOT 0.44 03/11/2017 1524   GFRNONAA >60 02/17/2020 1452   GFRNONAA >60 02/24/2019 1224   GFRAA >60 01/15/2020 1457   GFRAA >60 02/24/2019 1224    INo results found for: SPEP, UPEP  Lab Results  Component Value Date   WBC 7.3 02/17/2020   NEUTROABS 5.0 02/17/2020   HGB 13.9 02/17/2020   HCT 40.8 02/17/2020   MCV 93.8 02/17/2020   PLT 209 02/17/2020      Chemistry      Component Value Date/Time   NA 138 02/17/2020 1452   NA 138 03/11/2017 1524   K  4.3 02/17/2020 1452   K 3.8 03/11/2017 1524   CL 104 02/17/2020 1452   CO2 29 02/17/2020 1452   CO2 25 03/11/2017 1524   BUN 13 02/17/2020 1452   BUN 13.2 03/11/2017 1524   CREATININE 0.86 02/17/2020 1452   CREATININE 0.83 02/24/2019 1224   CREATININE 0.8 03/11/2017 1524      Component Value Date/Time   CALCIUM 9.4 02/17/2020 1452   CALCIUM 9.6 03/11/2017 1524   ALKPHOS 77 02/17/2020 1452   ALKPHOS 166 (H) 03/11/2017 1524   AST 18 02/17/2020 1452   AST 19 02/24/2019 1224   AST 24 03/11/2017 1524   ALT 11 02/17/2020 1452   ALT 15 02/24/2019 1224   ALT 38 03/11/2017 1524   BILITOT 0.4 02/17/2020 1452   BILITOT 0.5 02/24/2019 1224   BILITOT  0.44 03/11/2017 1524       No results found for: LABCA2  No components found for: HYIFO277  No results for input(s): INR in the last 168 hours.  Urinalysis    Component Value Date/Time   COLORURINE YELLOW 01/03/2017 1205   APPEARANCEUR HAZY (A) 01/03/2017 1205   LABSPEC 1.006 01/03/2017 1205   PHURINE 7.0 01/03/2017 1205   GLUCOSEU NEGATIVE 01/03/2017 1205   HGBUR SMALL (A) 01/03/2017 1205   BILIRUBINUR NEGATIVE 01/03/2017 1205   BILIRUBINUR n 10/01/2016 1316   KETONESUR NEGATIVE 01/03/2017 1205   PROTEINUR NEGATIVE 01/03/2017 1205   UROBILINOGEN 0.2 10/01/2016 1316   NITRITE NEGATIVE 01/03/2017 1205   LEUKOCYTESUR LARGE (A) 01/03/2017 1205    ELIGIBLE FOR AVAILABLE RESEARCH PROTOCOL: PALLAS   STUDIES: MM DIAG BREAST TOMO BILATERAL  Result Date: 02/16/2020 CLINICAL DATA:  History of RIGHT lumpectomy with chemotherapy and radiation therapy in 2017. history of benign excisional biopsy in the LEFT breast. Patient palpates a mass in the LOWER RIGHT axilla. EXAM: DIGITAL DIAGNOSTIC BILATERAL MAMMOGRAM WITH CAD AND TOMO ULTRASOUND RIGHT AXILLA COMPARISON:  Previous exam(s). ACR Breast Density Category b: There are scattered areas of fibroglandular density. FINDINGS: Postoperative changes are identified in both breasts. No  suspicious mass, distortion, or microcalcifications are identified to suggest presence of malignancy. Spot tangential view is performed of the LOWER RIGHT axilla, showing normal appearing fibrofatty tissue. Mammographic images were processed with CAD. On physical exam, I palpate a discrete mobile superficial mass in the posterior RIGHT axilla corresponding to the area of patient's concern. There is possible associated visible punctum. Targeted ultrasound is performed, showing a circumscribed oval hypoechoic intradermal mass in the LOWER RIGHT axilla corresponding to the palpable abnormality. There is no associated blood flow. Mass measures 0.2 centimeters. IMPRESSION: 1.  No mammographic or ultrasound evidence for malignancy. 2. Postoperative changes bilaterally. 3. Benign intradermal inclusion cysts in the posterior RIGHT axilla. RECOMMENDATION: Diagnostic mammogram is suggested in 1 year. (Code:DM-B-01Y) I have discussed the findings and recommendations with the patient and her husband. If applicable, a reminder letter will be sent to the patient regarding the next appointment. BI-RADS CATEGORY  2: Benign. Electronically Signed   By: Nolon Nations M.D.   On: 02/16/2020 14:40   [name] AXILLA RIGHT  Result Date: 02/16/2020 CLINICAL DATA:  History of RIGHT lumpectomy with chemotherapy and radiation therapy in 2017. history of benign excisional biopsy in the LEFT breast. Patient palpates a mass in the LOWER RIGHT axilla. EXAM: DIGITAL DIAGNOSTIC BILATERAL MAMMOGRAM WITH CAD AND TOMO ULTRASOUND RIGHT AXILLA COMPARISON:  Previous exam(s). ACR Breast Density Category b: There are scattered areas of fibroglandular density. FINDINGS: Postoperative changes are identified in both breasts. No suspicious mass, distortion, or microcalcifications are identified to suggest presence of malignancy. Spot tangential view is performed of the LOWER RIGHT axilla, showing normal appearing fibrofatty tissue. Mammographic images were  processed with CAD. On physical exam, I palpate a discrete mobile superficial mass in the posterior RIGHT axilla corresponding to the area of patient's concern. There is possible associated visible punctum. Targeted ultrasound is performed, showing a circumscribed oval hypoechoic intradermal mass in the LOWER RIGHT axilla corresponding to the palpable abnormality. There is no associated blood flow. Mass measures 0.2 centimeters. IMPRESSION: 1.  No mammographic or ultrasound evidence for malignancy. 2. Postoperative changes bilaterally. 3. Benign intradermal inclusion cysts in the posterior RIGHT axilla. RECOMMENDATION: Diagnostic mammogram is suggested in 1 year. (Code:DM-B-01Y) I have discussed the findings and recommendations with the patient and her husband.  If applicable, a reminder letter will be sent to the patient regarding the next appointment. BI-RADS CATEGORY  2: Benign. Electronically Signed   By: Nolon Nations M.D.   On: 02/16/2020 14:40     ASSESSMENT: 74 y.o. [place] woman status post right breast upper outer quadrant and right axillary lymph node biopsy 08/23/2015, both positive for a clinical T1 N2, stage IIIA invasive ductal carcinoma, grade 2, strongly estrogen receptor positive, 5% progesterone receptor positive, with an MIB-120-30% and no HER-2 amplification  (a) biopsy of a 0.9 cm Right breast satellite nodule 09/13/2015 showed invasive ductal carcinoma, grade 2, E-cadherin strongly positive, with a prognostic panel identical to the larger right upper quadrant mass  (1) left breast biopsy 08/24/2015 shows a complex sclerosing lesion, excised 12/05/2015  (2) neoadjuvant chemotherapy consisting of cyclophosphamide and docetaxel every 21 days 4, with OnPro support, started 09/12/2015, completed 11/14/2015  (3) status post right lumpectomy and sentinel lymph node dissection 12/05/2015 for a residual pT2 pN3, stage IIIC invasive ductal carcinoma, grade 2, with a close anterior  margin (skin)  (4) adjuvant radiation 01/18/16 - 11/6/17with capecitabine chemo-sensitization   1) Right breast/nodal areas: 45 Gy in 25 fractions                         2) Right breast boost: 16 Gy in 8 fractions  (5) capecitabine started 04/13/2016, discontinued January 2018 with mild/moderate [name] effects developing  (6) anastrozole started 06/01/2016  (a) bone density January 30, 2017 shows a T score of -2.7 (osteoporosis)  (7) genetics testing ([place]kenazi descent) 02/07/2016 through the Breast/Ovarian Cancer Panel offered by [facility] Hope Pigeon, MD) i found no deleterious mutations in  ATM, BARD1, BRCA1, BRCA2, BRIP1, CDH1, CHEK2, FANCC, MLH1, MSH2, MSH6, NBN, PALB2, PMS2, PTEN, RAD51C, RAD51D, TP53, and XRCC2.  This panel also includes deletion/duplication analysis (without sequencing) for one gene, EPCAM.  (8) consented to PALLAS trial   (a) randomized to anastrozole alone   PLAN: Radiah is now a little over 4 years out from definitive surgery for her breast cancer with no evidence of disease recurrence.  This is very febrile.  She continues on anastrozole with good tolerance.  The plan is to continue for a minimum of 5 years.  She has had several treatments for the right forearm cellulitis.  I think it would help if she wore the compression sleeve pretty much all the time and we discussed that.  Now however I am also prescribing Keflex for her 100 mg twice daily.  She usually gets a yeast infection from that so I also prescribed Diflucan.  She already has a visit with me in person in March but I would like to check in on her in about 6 weeks virtually just to make sure that these medications have worked for her  Total encounter time 25 minutes.*    Lynford Espinoza, Virgie Dad, MD  02/17/20 5:43 PM Medical Oncology and Hematology [facility] [place], [place] [address] Tel. [phone]    Fax. [phone]   I, Wilburn Mylar, am acting as  scribe for Dr. Virgie Dad. Rayshon Albaugh.  I, Lurline Del MD, have reviewed the above documentation for accuracy and completeness, and I agree with the above.   *Total Encounter Time as defined by the Centers for Medicare and Medicaid Services includes, in addition to the face-to-face time of a patient visit (documented in the note above) non-face-to-face time: obtaining and reviewing outside history, ordering and  reviewing medications, tests or procedures, care coordination (communications with other health care professionals or caregivers) and documentation in the medical record.

## 2020-02-19 ENCOUNTER — Telehealth: Payer: Self-pay | Admitting: Oncology

## 2020-02-19 NOTE — Telephone Encounter (Signed)
Scheduled per 10/20 los. Called pt left a msg

## 2020-02-24 ENCOUNTER — Telehealth: Payer: Self-pay

## 2020-02-24 NOTE — Telephone Encounter (Signed)
Patient cannot remember if the pain is the same as it was in January. Scheduled for OV.

## 2020-02-24 NOTE — Telephone Encounter (Signed)
Patient had bilateral L4 TF on 05/11/19. Ok to repeat if helped, same problem/side, and no new injury?

## 2020-02-24 NOTE — Telephone Encounter (Signed)
Patient called in saying she is in bad pain and still has not been contacted yet to sch appt

## 2020-02-24 NOTE — Telephone Encounter (Signed)
ok 

## 2020-02-24 NOTE — Telephone Encounter (Signed)
Patient called in to get sch with dr newton for epidural injection

## 2020-02-25 NOTE — Telephone Encounter (Signed)
ok 

## 2020-03-01 ENCOUNTER — Other Ambulatory Visit: Payer: Self-pay

## 2020-03-01 MED ORDER — ROSUVASTATIN CALCIUM 40 MG PO TABS
40.0000 mg | ORAL_TABLET | Freq: Every day | ORAL | 3 refills | Status: DC
Start: 2020-03-01 — End: 2021-01-30

## 2020-03-01 NOTE — Telephone Encounter (Signed)
In Dr. Hassell Done last [place] note it says to continue crestor 40 mg QD. Rx sent in.

## 2020-03-01 NOTE — Telephone Encounter (Signed)
Pt calling requesting a refill on rosuvastatin 20 mg tablet. Pt stated that Dr. Delfina Redwood changed medication to pt taking 2 tablets (total 40 mg) daily and pt stated that Dr. Irish Lack agreed. Would Dr. Irish Lack like to change this medication to pt taking 2 tablets daily? Pt would like a call back concerning this matter. Please address

## 2020-03-02 ENCOUNTER — Encounter: Payer: Self-pay | Admitting: Physical Medicine and Rehabilitation

## 2020-03-02 ENCOUNTER — Ambulatory Visit (INDEPENDENT_AMBULATORY_CARE_PROVIDER_SITE_OTHER): Payer: Medicare Other | Admitting: Physical Medicine and Rehabilitation

## 2020-03-02 ENCOUNTER — Other Ambulatory Visit: Payer: Self-pay

## 2020-03-02 VITALS — BP 134/82 | HR 64

## 2020-03-02 DIAGNOSIS — M25551 Pain in right hip: Secondary | ICD-10-CM | POA: Diagnosis not present

## 2020-03-02 DIAGNOSIS — M47816 Spondylosis without myelopathy or radiculopathy, lumbar region: Secondary | ICD-10-CM

## 2020-03-02 DIAGNOSIS — M25552 Pain in left hip: Secondary | ICD-10-CM

## 2020-03-02 DIAGNOSIS — M797 Fibromyalgia: Secondary | ICD-10-CM

## 2020-03-02 DIAGNOSIS — M5442 Lumbago with sciatica, left side: Secondary | ICD-10-CM

## 2020-03-02 DIAGNOSIS — I2510 Atherosclerotic heart disease of native coronary artery without angina pectoris: Secondary | ICD-10-CM | POA: Diagnosis not present

## 2020-03-02 DIAGNOSIS — G8929 Other chronic pain: Secondary | ICD-10-CM

## 2020-03-02 DIAGNOSIS — M5441 Lumbago with sciatica, right side: Secondary | ICD-10-CM

## 2020-03-02 NOTE — Progress Notes (Signed)
Pain across low back and down backs of both thighs. Left leg is worse. Hurts getting up from sitting or going up and down stairs. Wakes her during the night sometimes. Bed rest is the only thing that seems to help. Numeric Pain Rating Scale and Functional Assessment Average Pain 8 Pain Right Now 5 My pain is constant and sharp Pain is worse with: standing and stairs, going from sit to stand Pain improves with: rest   In the last MONTH (on 0-10 scale) has pain interfered with the following?  1. General activity like being  able to carry out your everyday physical activities such as walking, climbing stairs, carrying groceries, or moving a chair?  Rating(7)  2. Relation with others like being able to carry out your usual social activities and roles such as  activities at home, at work and in your community. Rating(7)  3. Enjoyment of life such that you have  been bothered by emotional problems such as feeling anxious, depressed or irritable?  Rating(8)

## 2020-03-03 ENCOUNTER — Encounter: Payer: Self-pay | Admitting: Physical Medicine and Rehabilitation

## 2020-03-03 NOTE — Progress Notes (Signed)
[name] - 74 y.o. female MRN [id number]  Date of birth: [birth year]  Office Visit Note: Visit Date: 03/02/2020 PCP: Seward Carol, MD Referred by: Seward Carol, MD  Subjective: Chief Complaint  Patient presents with  . Lower Back - Pain  . Right Leg - Pain  . Left Leg - Pain   HPI: [name] is a 74 y.o. female who comes in today For evaluation and management of chronic worsening severe low back pain and bilateral hip and thigh pain.  Her history with [name] is well-documented.  She last saw [name] in January of this year and we completed bilateral L4 transforaminal epidural steroid injection with fluoroscopic guidance with good relief up until just the last few months.  She reports now pain across the lower back and then down the back posterior lateral legs left worse than right.  She gets pain going from sit to stand and going up and down stairs.  She gets a lot of knee pain going up and down the stairs.  She does report that it does wake her up at night sometimes.  When her symptoms are very severe she will lay in the bed and that seems to help.  She rates her pain as an 8 out of 10 constant and sharp pain again worse with activity standing and ambulating particular stairs etc.  She has not had any focal weakness no bowel or bladder changes no trauma.  Her case is complicated by a history of fibromyalgia with interstitial cystitis and chronic pain that she has managed over the years for many years as well as history of breast cancer for which we are updated through her oncologist quite frequently.  She has been doing well with that.  In the past for her knees and orthopedic complaints she saw Dr. Meridee Score.  MRI was completed in 2015 showing facet arthritis and lateral recess narrowing which was mild to moderate in severity.  She has had imaging surveillance for breast cancer quite frequently and routinely monitored.  Review of Systems  Musculoskeletal: Positive for back pain.        Bilateral hip and leg pain  All other systems reviewed and are negative.  Otherwise per HPI.  Assessment & Plan: Visit Diagnoses:  1. Spondylosis without myelopathy or radiculopathy, lumbar region   2. Chronic bilateral low back pain with bilateral sciatica   3. Pain in left hip   4. Pain in right hip   5. Fibromyalgia     Plan: Findings:  Chronic worsening severe axial low back pain with referral into the posterior lateral hips and thighs left more than right and worsening over the last several months without specific injury.  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  Complicated history of fibromyalgia and chronic pain with continued home exercises and medication management for which some of it she does not tolerate.  She has done well with intermittent epidural injection the last being in January.  No red flag complaints.  At this point I think the approach is to repeat the L4 transforaminal injection to see how much relief she gets.  Consideration would be given to facet joint/medial branch blocks and radiofrequency ablation at some point.  If she does not do well with the injection in terms of pain relief consider updating MRI of the lumbar spine at this point.  She does continue to take hydrocodone for  her fibromyalgia which typically is not standard but she has been maintaining this for quite some time and as long as she is followed well for that she will continue with that.  Her case is also complicated by use of anticoagulant Brilinta but that should be okay for transforaminal epidural steroid injection.  Chronic worsening knee pain without really much of a history of knee issues in the past.  No knee surgery no replacements etc.  If her knees are not relieved after epidural injection we will have her follow-up with Dr. Meridee Score for evaluation of her knees.  Brief exam today showed no varus or valgus  instability mild joint line tenderness nothing exquisite no pain over the pes anserine bursa.  No locking or clicking.    Meds & Orders: No orders of the defined types were placed in this encounter.  No orders of the defined types were placed in this encounter.   Follow-up: Return for Bilateral L4 transforaminal epidural steroid injection.   Procedures: No procedures performed  No notes on file   Clinical History: MRI LUMBAR SPINE WITHOUT CONTRAST SOS   TECHNIQUE: Multiplanar, multisequence MR imaging of the lumbar spine was performed. No intravenous contrast was administered.   COMPARISON: None.   FINDINGS: The vertebral bodies of the lumbar spine are normal in size. The vertebral bodies of the lumbar spine are normal in alignment. Marrow heterogeneity is present. Although this can be caused by marrow infiltrative processes, the most common causes include anemia, smoking, obesity, or advancing age. There is degenerative disc disease with disc desiccation and disc height loss at L5-S1. There is disc desiccation throughout the remainder of the lumbar spine.   The spinal cord is normal in signal and contour. The cord terminates normally at L1 . The nerve roots of the cauda equina and the filum terminale are normal.   The visualized portions of the SI joints are unremarkable.   The imaged intra-abdominal contents are unremarkable.   T12-L1: No significant disc bulge. No evidence of neural foraminal stenosis. No central canal stenosis.   L1-L2: Mild broad-based disc bulge. No evidence of neural foraminal stenosis. No central canal stenosis.   L2-L3: Mild broad-based disc bulge. No evidence of neural foraminal stenosis. No central canal stenosis.   L3-L4: Mild broad-based disc bulge. No evidence of neural foraminal stenosis. No central canal stenosis.   L4-L5: Mild broad-based disc bulge. Mild bilateral lateral recess stenosis. No evidence of neural foraminal stenosis.  No central canal stenosis. Mild bilateral facet arthropathy.   L5-S1: Mild broad-based disc bulge. Mild bilateral facet arthropathy. No evidence of neural foraminal stenosis. No central canal stenosis.   IMPRESSION: 1. Mild broad-based disc bulges throughout the lumbar spine. 2. Mild bilateral facet arthropathy with mild bilateral lateral recess stenosis at L4-5.     Electronically Signed  By: Kathreen Devoid  On: 01/29/2014 12:05   She reports that she has quit smoking. Her smoking use included cigarettes. She has a 3.75 pack-year smoking history. She has never used smokeless tobacco. No results for input(s): HGBA1C, LABURIC in the last 8760 hours.  Objective:  VS:  HT:    WT:   BMI:     BP:134/82  HR:64bpm  TEMP: ( )  RESP:  Physical Exam Constitutional:      General: She is not in acute distress.    Appearance: Normal appearance. She is not ill-appearing.  HENT:     Head: Normocephalic and atraumatic.     Right Ear: External ear  normal.     Left Ear: External ear normal.  Eyes:     Extraocular Movements: Extraocular movements intact.  Cardiovascular:     Rate and Rhythm: Normal rate.     Pulses: Normal pulses.  Musculoskeletal:        General: Tenderness present. No signs of injury.     Cervical back: Neck supple. Tenderness present. No rigidity.     Right lower leg: No edema.     Left lower leg: No edema.     Comments: Patient has good distal strength with mild pain over the greater trochanters.  No clonus or focal weakness. Patient somewhat slow to rise from a seated position to full extension.  There is concordant low back pain with facet loading and lumbar spine extension rotation.  There are no definitive trigger points but the patient is somewhat tender across the lower back and PSIS.  There is no pain with hip rotation.  Knees do not show a great deal of effusion there is some mild joint line tenderness there is no varus or valgus instability.  No pain over the pes  anserine bursa.   Lymphadenopathy:     Cervical: No cervical adenopathy.  Skin:    Findings: No erythema, lesion or rash.  Neurological:     General: No focal deficit present.     Mental Status: She is alert and oriented to person, place, and time.     Sensory: No sensory deficit.     Motor: No weakness or abnormal muscle tone.     Coordination: Coordination normal.     Gait: Gait abnormal.  Psychiatric:        Mood and Affect: Mood normal.        Behavior: Behavior normal.     Ortho Exam  Imaging: No results found.  Past Medical/Family/Surgical/Social History: Medications & Allergies reviewed per EMR, new medications updated. Patient Active Problem List   Diagnosis Date Noted  . Lumbar radiculopathy 05/11/2019  . Bilateral stenosis of lateral recess of lumbar spine 05/11/2019  . Hyperlipidemia 12/23/2018  . Chronic diastolic CHF (congestive heart failure) ([place]) 11/24/2018  . Abnormal transaminases 03/24/2018  . CAD (coronary artery disease) 02/11/2017  . 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 Status post coronary artery stent placement   . Non-ST elevation (NSTEMI) myocardial infarction ([place])   . Depression with anxiety 12/31/2016  . Chest pain 12/31/2016  . Hyponatremia 12/31/2016  . Chronic pain 12/31/2016  . Essential hypertension 12/31/2016  . Unstable angina ([place])   . Family history of breast cancer in female 02/08/2016  . Rash and nonspecific skin eruption 10/03/2015  . 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 Malignant neoplasm of upper-outer quadrant of right breast in female, estrogen receptor positive ([place]) 09/01/2015  . 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 Asthma 09/12/2011  . Menopausal symptoms   . Chronic vaginitis   . Fibromyalgia   . Interstitial cystitis   . UNSPECIFIED HYPOTHYROIDISM 06/14/2009  . DEPRESSIVE DISORDER NOT ELSEWHERE CLASSIFIED 06/14/2009  . Hereditary and idiopathic peripheral neuropathy 06/14/2009   . UNSPEC HEMORRHOIDS WITHOUT MENTION COMPLICATION 62/83/1517  . ALLERGIC RHINITIS CAUSE UNSPECIFIED 06/14/2009  . ESOPHAGEAL REFLUX 06/14/2009  . Irritable bowel syndrome 06/14/2009  . INSOMNIA UNSPECIFIED 06/14/2009  . Osteoporosis 08/27/2007   Past Medical History:  Diagnosis Date  . Anxiety   . Arthritis 2008  . Asthma 1953   age 77  . Breast cancer ([place]) dx'd 2017   right  .  Cancer [facility])    breast cancer  . Chronic vaginitis 1998  . Fibromyalgia 1982  . GERD (gastroesophageal reflux disease)    since at least 2011  . Hemorrhoids 2009  . History of radiation therapy 01/18/16-03/05/16   right breast/nodal areas 45 Gy, right breast boost 16 Gy  . Hypoglycemia 2003  . Hypothyroidism 1964   age 5  . Interstitial cystitis 1998  . Irritable bowel syndrome 06/14/2009   Qualifier: Diagnosis of  By: Patsy Baltimore RN, Langley Gauss    . Menopausal symptoms   . Osteoporosis 12/24/2006  . Personal history of chemotherapy   . Personal history of radiation therapy   . Pneumonia 1979   varicella  . PONV (postoperative nausea and vomiting) 1979   Family History  Problem Relation Age of Onset  . Hypertension Mother   . Heart disease Mother        d. 33  . Parkinsonism Father        d. 97  . Heart disease Sister   . Breast cancer Sister 89       s/p mastectomy; reportedly negative BRCA1/2 testing approx 7 years ago  . Melanoma Sister        d. 61s; hx of incresed sun exposure  . Cancer Sister        dx. sarcoma of her face in her 67s; treated w/ MOHS surgery  . Parkinsonism Paternal Aunt        d. 90s  . Parkinsonism Paternal Uncle        d. late 44s  . Kidney failure Maternal Grandmother 16  . Heart attack Maternal Grandfather        d. 53s  . Heart disease Paternal Grandmother        d. 36  . Breast cancer Other 54       maternal great aunt (MGF's sister)  . Kidney cancer Paternal Uncle 26       d. 51s; unsure about smoking status   Past Surgical History:  Procedure  Laterality Date  . BLADDER HYPEREXTENSION     X4, prior to 01/2006  . BLADDER SUSPENSION    . BREAST BIOPSY    . BREAST LUMPECTOMY Right    2017  . BREAST LUMPECTOMY WITH NEEDLE LOCALIZATION AND AXILLARY LYMPH NODE DISSECTION Right 12/05/2015   Procedure: RIGHT BREAST NEEDLE LOCALIZED (WIRES X 2) LUMPECTOMY WITH AXILLARY LYMPH NODE DISSECTION;  Surgeon: Fanny Skates, MD;  Location: [place];  Service: General;  Laterality: Right;  . BREAST LUMPECTOMY WITH RADIOACTIVE SEED LOCALIZATION Left 12/05/2015   Procedure: RADIOACTIVE SEED GUIDED LEFT BREAST LUMPECTOMY;  Surgeon: Fanny Skates, MD;  Location: [place];  Service: General;  Laterality: Left;  . BREAST SURGERY    . BUNIONECTOMY     prior to 01/2006  . CARDIAC CATHETERIZATION    . CESAREAN SECTION  1979  . CHOLECYSTECTOMY  02/08/2006   DR. INGRAM  . COLONOSCOPY    . CORONARY STENT INTERVENTION N/A 01/01/2017   Procedure: CORONARY STENT INTERVENTION;  Surgeon: Jettie Booze, MD;  Location: [place];  Service: Cardiovascular;  Laterality: N/A;  . DENTAL SURGERY  2009-2016   13 dental implants  . LEFT HEART CATH AND CORONARY ANGIOGRAPHY N/A 01/01/2017   Procedure: LEFT HEART CATH AND CORONARY ANGIOGRAPHY;  Surgeon: Jettie Booze, MD;  Location: [place] [facility];  Service: Cardiovascular;  Laterality: N/A;  . LEG SURGERY  07/06/2004   Broken tibia  . NASAL SINUS SURGERY  X4; prior to 01/2006  . PORT-A-CATH REMOVAL Right 12/05/2015   Procedure: REMOVAL PORT-A-CATH;  Surgeon: Fanny Skates, MD;  Location: [place];  Service: General;  Laterality: Right;  . PORTACATH PLACEMENT Left 09/08/2015   Procedure: INSERTION OF PORT-A-CATH ;  Surgeon: Fanny Skates, MD;  Location: [facility];  Service: General;  Laterality: Left;   Social History   Occupational History  . Not on file  Tobacco Use  . Smoking status: Former Smoker    Packs/day: 0.25    Years: 15.00    Pack years: 3.75    Types: Cigarettes  . Smokeless tobacco:  Never Used  . Tobacco comment: social smoker/"on and off"  Vaping Use  . Vaping Use: Never used  Substance and Sexual Activity  . Alcohol use: Yes    Alcohol/week: 0.0 standard drinks    Comment: social  . Drug use: No  . Sexual activity: Yes    Birth control/protection: Post-menopausal

## 2020-03-04 ENCOUNTER — Ambulatory Visit (INDEPENDENT_AMBULATORY_CARE_PROVIDER_SITE_OTHER): Payer: Medicare Other | Admitting: Obstetrics & Gynecology

## 2020-03-04 ENCOUNTER — Encounter: Payer: Self-pay | Admitting: Obstetrics & Gynecology

## 2020-03-04 ENCOUNTER — Other Ambulatory Visit: Payer: Self-pay

## 2020-03-04 VITALS — BP 126/70 | HR 72 | Resp 16 | Ht 61.75 in | Wt 153.0 lb

## 2020-03-04 DIAGNOSIS — N952 Postmenopausal atrophic vaginitis: Secondary | ICD-10-CM | POA: Diagnosis not present

## 2020-03-04 DIAGNOSIS — Z9289 Personal history of other medical treatment: Secondary | ICD-10-CM | POA: Diagnosis not present

## 2020-03-04 DIAGNOSIS — M810 Age-related osteoporosis without current pathological fracture: Secondary | ICD-10-CM

## 2020-03-04 DIAGNOSIS — I2510 Atherosclerotic heart disease of native coronary artery without angina pectoris: Secondary | ICD-10-CM | POA: Diagnosis not present

## 2020-03-04 DIAGNOSIS — Z170 Estrogen receptor positive status [ER+]: Secondary | ICD-10-CM

## 2020-03-04 DIAGNOSIS — N760 Acute vaginitis: Secondary | ICD-10-CM | POA: Diagnosis not present

## 2020-03-04 DIAGNOSIS — C50411 Malignant neoplasm of upper-outer quadrant of right female breast: Secondary | ICD-10-CM | POA: Diagnosis not present

## 2020-03-04 MED ORDER — NONFORMULARY OR COMPOUNDED ITEM: 4 refills | Status: DC

## 2020-03-04 NOTE — Progress Notes (Signed)
74 y.o. G58P2002 Married White or Caucasian female here for breast and pelvic exam.  I am also following her for history of breast cancer as well as atrophic changes.  Uses Vit E vaginal suppositories and Replens vaginal moisturizer.  If not using these regularly will get very dry.  Is on anastrozole and has significant vaginal atrophy from it.    Husband had to have toe amputated but is doing really well now.  This was very stressful for her as she had to really coordinate his care.  She is so pleased with his improvement, however.  Saw Dr. Irish Lack 02/11/2020.  Follow up lipid panel recommended in a few months.  Follow up 1 year follow up otherwise.    Moving to [employer].  Didn't think she would do this at her age but due to husband's medical issues, feel this is a good move.  Patient's last menstrual period was 04/30/1996 (approximate).          Sexually active: No.  H/O STD:  no  Health Maintenance: PCP:  Seward Carol, MD.  Last wellness appt was a few months ago per patient.  Did blood work at that appt:  yes Vaccines are up to date:  yes Colonoscopy:  01/01/14 f/u 5 years -- patient aware that she is due MMG:  02/16/20 BIRADS 2 benign/density b BMD:  01/30/17 Osteoporosis.  Can't take medications that risks osteonecrosis.  Having significant bone loss in her jaw.  Declines repeat testing at that time.   Last pap smear:  12/29/18 Neg.   H/o abnormal pap smear:  no   reports that she has quit smoking. Her smoking use included cigarettes. She has a 3.75 pack-year smoking history. She has never used smokeless tobacco. She reports current alcohol use. She reports that she does not use drugs.  Past Medical History:  Diagnosis Date  . Anxiety   . Arthritis 2008  . Asthma 1953   age 11  . Breast cancer ([place]) dx'd 2017   right  . Cancer [facility] [place])    breast cancer  . Chronic vaginitis 1998  . Fibromyalgia 1982  . GERD (gastroesophageal reflux disease)    since at least 2011  . Hemorrhoids  2009  . History of radiation therapy 01/18/16-03/05/16   right breast/nodal areas 45 Gy, right breast boost 16 Gy  . Hypoglycemia 2003  . Hypothyroidism 1964   age 39  . Interstitial cystitis 1998  . Irritable bowel syndrome 06/14/2009   Qualifier: Diagnosis of  By: Patsy Baltimore RN, Langley Gauss    . Menopausal symptoms   . Osteoporosis 12/24/2006  . Personal history of chemotherapy   . Personal history of radiation therapy   . Pneumonia 1979   varicella  . PONV (postoperative nausea and vomiting) 1979    Past Surgical History:  Procedure Laterality Date  . BLADDER HYPEREXTENSION     X4, prior to 01/2006  . BLADDER SUSPENSION    . BREAST BIOPSY    . BREAST LUMPECTOMY Right    2017  . BREAST LUMPECTOMY WITH NEEDLE LOCALIZATION AND AXILLARY LYMPH NODE DISSECTION Right 12/05/2015   Procedure: RIGHT BREAST NEEDLE LOCALIZED (WIRES X 2) LUMPECTOMY WITH AXILLARY LYMPH NODE DISSECTION;  Surgeon: Fanny Skates, MD;  Location: [place];  Service: General;  Laterality: Right;  . BREAST LUMPECTOMY WITH RADIOACTIVE SEED LOCALIZATION Left 12/05/2015   Procedure: RADIOACTIVE SEED GUIDED LEFT BREAST LUMPECTOMY;  Surgeon: Fanny Skates, MD;  Location: [place];  Service: General;  Laterality: Left;  . BREAST SURGERY    .  BUNIONECTOMY     prior to 01/2006  . CARDIAC CATHETERIZATION    . CESAREAN SECTION  1979  . CHOLECYSTECTOMY  02/08/2006   DR. INGRAM  . COLONOSCOPY    . CORONARY STENT INTERVENTION N/A 01/01/2017   Procedure: CORONARY STENT INTERVENTION;  Surgeon: Jettie Booze, MD;  Location: [place] [facility];  Service: Cardiovascular;  Laterality: N/A;  . DENTAL SURGERY  2009-2016   13 dental implants  . LEFT HEART CATH AND CORONARY ANGIOGRAPHY N/A 01/01/2017   Procedure: LEFT HEART CATH AND CORONARY ANGIOGRAPHY;  Surgeon: Jettie Booze, MD;  Location: [place] [facility];  Service: Cardiovascular;  Laterality: N/A;  . LEG SURGERY  07/06/2004   Broken tibia  . NASAL SINUS SURGERY     X4; prior  to 01/2006  . PORT-A-CATH REMOVAL Right 12/05/2015   Procedure: REMOVAL PORT-A-CATH;  Surgeon: Fanny Skates, MD;  Location: [place];  Service: General;  Laterality: Right;  . PORTACATH PLACEMENT Left 09/08/2015   Procedure: INSERTION OF PORT-A-CATH ;  Surgeon: Fanny Skates, MD;  Location: [place];  Service: General;  Laterality: Left;    Current Outpatient Medications  Medication Sig Dispense Refill  . albuterol (PROVENTIL HFA;VENTOLIN HFA) 108 (90 Base) MCG/ACT inhaler Inhale 1-2 puffs into the lungs every 6 (six) hours as needed for wheezing or shortness of breath. Started in 1998    . anastrozole (ARIMIDEX) 1 MG tablet TAKE 1 TABLET(1 MG) BY MOUTH DAILY 90 tablet 4  . BRILINTA 90 MG TABS tablet TAKE 1 TABLET(90 MG) BY MOUTH TWICE DAILY 60 tablet 5  . buPROPion (WELLBUTRIN XL) 300 MG 24 hr tablet Take 300 mg by mouth daily.     . Calcium Carbonate-Vitamin D (CALCIUM + D PO) Take 1 tablet by mouth at bedtime. Started approximately 1998    . carisoprodol (SOMA) 350 MG tablet Take 350 mg by mouth 2 (two) times daily as needed for muscle spasms. Started in 2008.  2  . diazepam (VALIUM) 5 MG tablet Take 5 mg by mouth every 6 (six) hours as needed for muscle spasms (interstitial cystitis). Started approximately 2008.    . furosemide (LASIX) 20 MG tablet TAKE 1 TABLET(20 MG) BY MOUTH DAILY AS NEEDED FOR SHORTNESS OF BREATH 30 tablet 6  . gabapentin (NEURONTIN) 300 MG capsule TAKE 3 TO 4 CAPSULES(900 TO 1200 MG) BY MOUTH AT BEDTIME 120 capsule 4  . HYDROcodone-acetaminophen (NORCO) 10-325 MG tablet Take 1 tablet by mouth every 4 (four) hours as needed for moderate pain or severe pain (as needed for fibromyalgia and IC).     . hydrOXYzine (ATARAX/VISTARIL) 25 MG tablet Take 1 tablet by mouth at bedtime.    Marland Kitchen levothyroxine (SYNTHROID, LEVOTHROID) 75 MCG tablet Take 75 mcg by mouth daily.  2  . LORazepam (ATIVAN) 2 MG tablet Take 1 tablet by mouth daily as needed for anxiety.   5  . nitroGLYCERIN  (NITROSTAT) 0.4 MG SL tablet PLACE 1 TABLET UNDER TONGUE EVERY FIVE MINUTES AS NEEDED FOR CHEST PAIN 25 tablet 3  . NONFORMULARY OR COMPOUNDED ITEM Vit E vaginal suppositories 268m/ml.  One pv three times weekly.  #36/4RF 36 each 4  . ondansetron (ZOFRAN) 8 MG tablet Take 1 tablet (8 mg total) by mouth every 8 (eight) hours as needed for nausea or vomiting. 20 tablet 1  . pantoprazole (PROTONIX) 40 MG tablet Take 40 mg by mouth daily. Started in 2012.    . potassium chloride (K-DUR) 10 MEQ tablet TAKE 1 TABLET BY  MOUTH AS NEEDED. TAKE WITH LASIX 30 tablet 11  . prochlorperazine (COMPAZINE) 10 MG tablet Take 1 tablet (10 mg total) by mouth every 6 (six) hours as needed for nausea or vomiting. 385 tablet 1  . rosuvastatin (CRESTOR) 40 MG tablet Take 1 tablet (40 mg total) by mouth daily. TAKE 1 TABLET(40 MG) BY MOUTH DAILY 90 tablet 3  . terconazole (TERAZOL 7) 0.4 % vaginal cream Place 1 applicator vaginally at bedtime.    . Vaginal Lubricant (REPLENS VA) Place vaginally daily.    . Vitamin D, Ergocalciferol, (DRISDOL) 1.25 MG (50000 UNIT) CAPS capsule TAKE 1 CAPSULE BY MOUTH EVERY 7 DAYS 12 capsule 0  . zolpidem (AMBIEN) 10 MG tablet Take 10 mg by mouth at bedtime as needed for sleep.      No current facility-administered medications for this visit.    Family History  Problem Relation Age of Onset  . Hypertension Mother   . Heart disease Mother        d. 64  . Parkinsonism Father        d. 46  . Heart disease Sister   . Breast cancer Sister 51       s/p mastectomy; reportedly negative BRCA1/2 testing approx 7 years ago  . Melanoma Sister        d. 40s; hx of incresed sun exposure  . Cancer Sister        dx. sarcoma of her face in her 29s; treated w/ MOHS surgery  . Parkinsonism Paternal Aunt        d. 90s  . Parkinsonism Paternal Uncle        d. late 6s  . Kidney failure Maternal Grandmother 23  . Heart attack Maternal Grandfather        d. 35s  . Heart disease Paternal  Grandmother        d. 45  . Breast cancer Other 43       maternal great aunt (MGF's sister)  . Kidney cancer Paternal Uncle 54       d. 48s; unsure about smoking status    Review of Systems  All other systems reviewed and are negative.   Exam:   BP 126/70 (BP Location: Left Arm, Patient Position: Sitting, Cuff Size: Normal)   Pulse 72   Resp 16   Ht 5' 1.75" (1.568 m)   Wt 153 lb (69.4 kg)   LMP 04/30/1996 (Approximate)   BMI 28.21 kg/m   Height: 5' 1.75" (156.8 cm)  General appearance: alert, cooperative and appears stated age Breasts: right breast with extensive radiation changes, well healed scars, no new masses or LAD, no nipple discharge and is s/p lumpectomy and radiation; left breast with masses/skin changes/LAD, nipple discharge Abdomen: soft, non-tender; bowel sounds normal; no masses,  no organomegaly Lymph nodes: Cervical, supraclavicular, and axillary nodes normal.  No abnormal inguinal nodes palpated Neurologic: Grossly normal  Pelvic: External genitalia:  no lesions              Urethra:  normal appearing urethra with no masses, tenderness or lesions              Bartholins and Skenes: normal                 Vagina: normal appearing vagina with normal color and discharge, no lesions              Cervix: no lesions  Pap taken: No. Bimanual Exam:  Uterus:  normal size, contour, position, consistency, mobility, non-tender              Adnexa: normal adnexa and no mass, fullness, tenderness               Rectovaginal: Confirms               Anus:  normal sphincter tone, no lesions  Chaperone, Terence Lux, CMA, was present for exam.  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  Declines BMD now due to jaw issues.  Declines taking anything that would increase risk of osteonecrosis to jaw.  [facility] is an option but not  interested.  Atrophic vaginal changes, using Vit E vaginal suppositories and replens vaginal moisturizer I.C.  P:   Mammogram guidelines reviewed.  Doing yearly diagnostic 3D MMGs pap smear not indicated today.  Guidelines reviewed, specifically aged based guidelines from ASCCP RF for Vit E 300 IU/ml vaginal suppositories, on pv three itmes weekly faxed to [facility].  #36/4RF Colonoscopy is overdue and she is aware of this. Return annually or prn  28 minutes of total time was spent for this patient encounter, including preparation, face-to-face counseling with the patient and coordination of care, and documentation of the encounter.

## 2020-03-10 ENCOUNTER — Ambulatory Visit (INDEPENDENT_AMBULATORY_CARE_PROVIDER_SITE_OTHER): Payer: Medicare Other | Admitting: Physical Medicine and Rehabilitation

## 2020-03-10 ENCOUNTER — Other Ambulatory Visit: Payer: Self-pay

## 2020-03-10 ENCOUNTER — Ambulatory Visit: Payer: Self-pay

## 2020-03-10 ENCOUNTER — Encounter: Payer: Self-pay | Admitting: Physical Medicine and Rehabilitation

## 2020-03-10 VITALS — BP 155/86 | HR 67

## 2020-03-10 DIAGNOSIS — M48061 Spinal stenosis, lumbar region without neurogenic claudication: Secondary | ICD-10-CM | POA: Diagnosis not present

## 2020-03-10 DIAGNOSIS — M5416 Radiculopathy, lumbar region: Secondary | ICD-10-CM | POA: Diagnosis not present

## 2020-03-10 MED ORDER — METHYLPREDNISOLONE ACETATE 80 MG/ML IJ SUSP
80.0000 mg | Freq: Once | INTRAMUSCULAR | Status: AC
  Administered 2020-03-10: 80 mg

## 2020-03-10 NOTE — Progress Notes (Signed)
Pt state lower back pain the travels down the positier of both leg that stop at the knee. Pt state climbing stair, walking and standing for a long period of time makes the pain worse. Pt state she takes pain meds to help ease the pain. Pt has hx of inj on 1/11 Pt state   Numeric Pain Rating Scale and Functional Assessment Average Pain 8   In the last MONTH (on 0-10 scale) has pain interfered with the following?  1. General activity like being  able to carry out your everyday physical activities such as walking, climbing stairs, carrying groceries, or moving a chair?  Rating(10)   +Driver, -BT, -Dye Allergies.

## 2020-04-05 ENCOUNTER — Other Ambulatory Visit: Payer: Medicare Other

## 2020-04-11 ENCOUNTER — Other Ambulatory Visit: Payer: Self-pay

## 2020-04-11 ENCOUNTER — Other Ambulatory Visit: Payer: Medicare Other | Admitting: *Deleted

## 2020-04-11 DIAGNOSIS — E782 Mixed hyperlipidemia: Secondary | ICD-10-CM

## 2020-04-11 DIAGNOSIS — I252 Old myocardial infarction: Secondary | ICD-10-CM

## 2020-04-11 DIAGNOSIS — I5032 Chronic diastolic (congestive) heart failure: Secondary | ICD-10-CM

## 2020-04-11 DIAGNOSIS — I2510 Atherosclerotic heart disease of native coronary artery without angina pectoris: Secondary | ICD-10-CM

## 2020-04-11 LAB — HEPATIC FUNCTION PANEL
ALT: 15 IU/L (ref 0–32)
AST: 18 IU/L (ref 0–40)
Albumin: 4.1 g/dL (ref 3.7–4.7)
Alkaline Phosphatase: 82 IU/L (ref 44–121)
Bilirubin Total: 0.6 mg/dL (ref 0.0–1.2)
Bilirubin, Direct: 0.22 mg/dL (ref 0.00–0.40)
Total Protein: 6.6 g/dL (ref 6.0–8.5)

## 2020-04-11 LAB — LIPID PANEL
Chol/HDL Ratio: 1.8 ratio (ref 0.0–4.4)
Cholesterol, Total: 169 mg/dL (ref 100–199)
HDL: 96 mg/dL (ref >39)
LDL Chol Calc (NIH): 59 mg/dL (ref 0–99)
Triglycerides: 78 mg/dL (ref 0–149)
VLDL Cholesterol Cal: 14 mg/dL (ref 5–40)

## 2020-04-11 NOTE — Progress Notes (Signed)
[place]  [phone] [phone]     ID: [name] DOB: [birth year]  MR#: [id number]  XLK#:[id number]  Patient Care Team: Seward Carol, MD as PCP - General (Internal Medicine) Jettie Booze, MD as PCP - Cardiology (Cardiology) Jyaire Koudelka, Virgie Dad, MD as Consulting Physician (Oncology) Fanny Skates, MD as Consulting Physician (General Surgery) Luberta Robertson, MD (Radiology) Hennie Duos, MD as Consulting Physician (Rheumatology) Domingo Pulse, MD (Urology) Druscilla Brownie, MD as Consulting Physician (Dermatology) Luberta Mutter, MD as Consulting Physician (Ophthalmology) Magnus Sinning, MD as Consulting Physician (Physical Medicine and Rehabilitation) Megan Salon, MD as Consulting Physician (Gynecology) Loney Loh, MD (Dermatology) OTHER MD:   CHIEF COMPLAINT: Estrogen receptor positive breast cancer  CURRENT TREATMENT:  Anastrozole; PALLAS trial (observation)   INTERVAL HISTORY: [name] returns today for follow-up of her estrogen receptor positive breast cancer.    She continues on anastrozole.  She tolerates this with no significant [name] effect.  [name]'s last bone density screening on 01/30/2017 showed a T-score of -2.7, which is considered osteoporotic.   She is here partly to look at the cellulitis in the right forearm area.  This is described below   REVIEW OF SYSTEMS: [name] is going through a great deal of stress right now.  Her husband [name] is declining rapidly.  There are not only physical illness issues but also cognitive difficulties and this has been very hard for the entire family to adjust to.  [name] appears to be making very reasonable decisions and she is hoping to move to independent living at [facility] soon   COVID 19 VACCINATION STATUS: Status post [place] x2 followed by the booster August 2021   BREAST CANCER HISTORY: From the original intake note:  [name] had routine mammographic  screening in 08/18/2015 at the [facility], showing a right breast mass and possible axillary adenopathy. On 08/23/2015 she underwent bilateral diagnostic mammography with tomography and bilateral breast ultrasonography. The breast density was category B in the right breast there was a spiculated mass in the upper-outer quadrant measuring 1.2 cm and associated with pleomorphic calcifications. There were calcifications extending anteriorly and posteriorly from the mass worrisome for ductal carcinoma in situ. The total area in question is about 3 cm. There were also multiple enlarged right axillary lymph nodes. The mass was palpated as an area of fullness at the 12:00 position of the breast 3 cm from the nipple. There were palpable enlarged right axillary lymph nodes in the right axilla. Ultrasonography of the right breast confirmed an irregular hypoechoic mass in the area in question measuring 1.6 cm. There were multiple abnormal appearing enlarged right axillary lymph nodes, the largest measuring 2.3 cm.  In the left breast, mammography showed an area of architectural distortion associated with the left nipple. This was not palpable. Ultrasonography of the left breast found no correlate. This area of the left breast was biopsied under tomography 08/24/2015. This showed (SAA [id number]) a complex sclerosing lesion and fibroadenoma.  On 08/23/2015 the patient underwent biopsy of the right breast mass in 1 of the abnormal right axillary lymph nodes. 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 Both biopsies were HER-2 nonamplified, the breast being ratio 1.27 and number per cell 1.90, and the lymph node 1 ratio 1.24 and number per cell 3.0.  On 08/26/2015 the patient underwent bilateral  breast MRI. In the left breast there was no suspicious enhancement. The  left axilla was benign. In the right breast there was a mass described as in the upper inner quadrant measuring 2.3 cm. Less than a centimeter away from this mass there was a satellite nodule measuring 0.9 cm. In the right axilla there were multiple enlarged right axillary lymph nodes, including a confluent region measuring up to 4 cm. There were also enlarged level II lymph nodes present measuring up to 2.1 cm.  The patient's subsequent history is as detailed below   PAST MEDICAL HISTORY: Past Medical History:  Diagnosis Date  . Anxiety   . Arthritis 2008  . Asthma 1953   age 58  . Breast cancer ([place]) dx'd 2017   right  . Cancer [facility])    breast cancer  . Chronic vaginitis 1998  . Fibromyalgia 1982  . GERD (gastroesophageal reflux disease)    since at least 2011  . Hemorrhoids 2009  . History of radiation therapy 01/18/16-03/05/16   right breast/nodal areas 45 Gy, right breast boost 16 Gy  . Hypoglycemia 2003  . Hypothyroidism 1964   age 20  . Interstitial cystitis 1998  . Irritable bowel syndrome 06/14/2009   Qualifier: Diagnosis of  By: Patsy Baltimore RN, Langley Gauss    . Menopausal symptoms   . Osteoporosis 12/24/2006  . Personal history of chemotherapy   . Personal history of radiation therapy   . Pneumonia 1979   varicella  . PONV (postoperative nausea and vomiting) 1979    PAST SURGICAL HISTORY: Past Surgical History:  Procedure Laterality Date  . BLADDER HYPEREXTENSION     X4, prior to 01/2006  . BLADDER SUSPENSION    . BREAST BIOPSY    . BREAST LUMPECTOMY Right    2017  . BREAST LUMPECTOMY WITH NEEDLE LOCALIZATION AND AXILLARY LYMPH NODE DISSECTION Right 12/05/2015   Procedure: RIGHT BREAST NEEDLE LOCALIZED (WIRES X 2) LUMPECTOMY WITH AXILLARY LYMPH NODE DISSECTION;  Surgeon: Fanny Skates, MD;  Location: [place];  Service: General;  Laterality: Right;  . BREAST LUMPECTOMY WITH RADIOACTIVE SEED LOCALIZATION Left 12/05/2015   Procedure: RADIOACTIVE SEED GUIDED LEFT BREAST  LUMPECTOMY;  Surgeon: Fanny Skates, MD;  Location: [place];  Service: General;  Laterality: Left;  . BREAST SURGERY    . BUNIONECTOMY     prior to 01/2006  . CARDIAC CATHETERIZATION    . CESAREAN SECTION  1979  . CHOLECYSTECTOMY  02/08/2006   DR. INGRAM  . COLONOSCOPY    . CORONARY STENT INTERVENTION N/A 01/01/2017   Procedure: CORONARY STENT INTERVENTION;  Surgeon: Jettie Booze, MD;  Location: [place] [facility];  Service: Cardiovascular;  Laterality: N/A;  . DENTAL SURGERY  2009-2016   13 dental implants  . LEFT HEART CATH AND CORONARY ANGIOGRAPHY N/A 01/01/2017   Procedure: LEFT HEART CATH AND CORONARY ANGIOGRAPHY;  Surgeon: Jettie Booze, MD;  Location: [place] [facility];  Service: Cardiovascular;  Laterality: N/A;  . LEG SURGERY  07/06/2004   Broken tibia  . NASAL SINUS SURGERY     X4; prior to 01/2006  . PORT-A-CATH REMOVAL Right 12/05/2015   Procedure: REMOVAL PORT-A-CATH;  Surgeon: Fanny Skates, MD;  Location: [place];  Service: General;  Laterality: Right;  . PORTACATH PLACEMENT Left 09/08/2015   Procedure: INSERTION OF PORT-A-CATH ;  Surgeon: Fanny Skates, MD;  Location: [facility] OR;  Service: General;  Laterality: Left;    FAMILY HISTORY Family History  Problem Relation Age of Onset  . Hypertension  Mother   . Heart disease Mother        d. 46  . Parkinsonism Father        d. 67  . Heart disease Sister   . Breast cancer Sister 5       s/p mastectomy; reportedly negative BRCA1/2 testing approx 7 years ago  . Melanoma Sister        d. 81s; hx of incresed sun exposure  . Cancer Sister        dx. sarcoma of her face in her 60s; treated w/ MOHS surgery  . Parkinsonism Paternal Aunt        d. 90s  . Parkinsonism Paternal Uncle        d. late 76s  . Kidney failure Maternal Grandmother 27  . Heart attack Maternal Grandfather        d. 59s  . Heart disease Paternal Grandmother        d. 16  . Breast cancer Other 34       maternal great aunt (MGF's sister)  .  Kidney cancer Paternal Uncle 13       d. 1s; unsure about smoking status   The patient's father died at age 62 from complications of Parkinson's disease. The patient's mother died age 5 with heart disease. Sheralyn had no brothers. She had 2 sisters. One sister was diagnosed with breast cancer at age 67. She was tested for the BRCA gene and was negative. The same sister also had melanoma diagnosed in her 56s. The patient has one maternal great aunt diagnosed with breast cancer around age 16. There is no history of ovarian cancer in the family   GYNECOLOGIC HISTORY:  Patient's last menstrual period was 04/30/1996 (approximate). Menarche age 18, first live birth age 61. The patient is GX P2. She stopped having periods in her early 97s and took hormone replacement until her breast cancer diagnosis, April 2017.   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 He has been in remission for more than 10 years. Their daughter Nira Conn lives in [place] were she is Garment/textile technologist of the [employer]. Daughter Belenda Cruise is an infectious disease M.D. working and teaching at [employer] in [place]. The patient has 2 grandchildren. The family attends [employer]: Not in place   HEALTH MAINTENANCE: Social History   Tobacco Use  . Smoking status: Former Smoker    Packs/day: 0.25    Years: 15.00    Pack years: 3.75    Types: Cigarettes  . Smokeless tobacco: Never Used  . Tobacco comment: social smoker/"on and off"  Vaping Use  . Vaping Use: Never used  Substance Use Topics  . Alcohol use: Yes    Alcohol/week: 0.0 standard drinks    Comment: social  . Drug use: No     Colonoscopy: 2016/ [facility]  PAP: 11/2018, negative  Bone density: October 2018, T-score: -2.7  Lipid  panel:  Allergies  Allergen Reactions  . Chlorhexidine Other (See Comments)    [place] IS OK. The CLOTH WIPE causes reaction. Causes itching all over the area  . Other Rash    Adhesive tape    Current Outpatient Medications  Medication Sig Dispense Refill  . albuterol (PROVENTIL HFA;VENTOLIN HFA) 108 (90 Base) MCG/ACT inhaler Inhale 1-2 puffs  into the lungs every 6 (six) hours as needed for wheezing or shortness of breath. Started in 1998    . anastrozole (ARIMIDEX) 1 MG tablet Take 1 tablet (1 mg total) by mouth daily. 90 tablet 4  . BRILINTA 90 MG TABS tablet TAKE 1 TABLET(90 MG) BY MOUTH TWICE DAILY 60 tablet 5  . buPROPion (WELLBUTRIN XL) 300 MG 24 hr tablet Take 300 mg by mouth daily.     . Calcium Carbonate-Vitamin D (CALCIUM + D PO) Take 1 tablet by mouth at bedtime. Started approximately 1998    . carisoprodol (SOMA) 350 MG tablet Take 350 mg by mouth 2 (two) times daily as needed for muscle spasms. Started in 2008.  2  . cephALEXin (KEFLEX) 500 MG capsule Take 1 capsule (500 mg total) by mouth 2 (two) times daily. 14 capsule 0  . diazepam (VALIUM) 5 MG tablet Take 5 mg by mouth every 6 (six) hours as needed for muscle spasms (interstitial cystitis). Started approximately 2008.    . furosemide (LASIX) 20 MG tablet TAKE 1 TABLET(20 MG) BY MOUTH DAILY AS NEEDED FOR SHORTNESS OF BREATH 30 tablet 6  . gabapentin (NEURONTIN) 300 MG capsule TAKE 3 TO 4 CAPSULES(900 TO 1200 MG) BY MOUTH AT BEDTIME 120 capsule 4  . HYDROcodone-acetaminophen (NORCO) 10-325 MG tablet Take 1 tablet by mouth every 4 (four) hours as needed for moderate pain or severe pain (as needed for fibromyalgia and IC).     . hydrOXYzine (ATARAX/VISTARIL) 25 MG tablet Take 1 tablet by mouth at bedtime.    Marland Kitchen levothyroxine (SYNTHROID, LEVOTHROID) 75 MCG tablet Take 75 mcg by mouth daily.  2  . LORazepam (ATIVAN) 2 MG tablet Take 1 tablet by mouth daily as needed for anxiety.   5  . nitroGLYCERIN (NITROSTAT) 0.4 MG SL tablet  PLACE 1 TABLET UNDER TONGUE EVERY FIVE MINUTES AS NEEDED FOR CHEST PAIN 25 tablet 3  . NONFORMULARY OR COMPOUNDED ITEM Vit E vaginal suppositories 287m/ml.  One pv three times weekly.  #36/4RF 36 each 4  . ondansetron (ZOFRAN) 8 MG tablet Take 1 tablet (8 mg total) by mouth every 8 (eight) hours as needed for nausea or vomiting. 20 tablet 1  . pantoprazole (PROTONIX) 40 MG tablet Take 40 mg by mouth daily. Started in 2012.    . potassium chloride (K-DUR) 10 MEQ tablet TAKE 1 TABLET BY MOUTH AS NEEDED. TAKE WITH LASIX 30 tablet 11  . prochlorperazine (COMPAZINE) 10 MG tablet Take 1 tablet (10 mg total) by mouth every 6 (six) hours as needed for nausea or vomiting. 385 tablet 1  . rosuvastatin (CRESTOR) 40 MG tablet Take 1 tablet (40 mg total) by mouth daily. TAKE 1 TABLET(40 MG) BY MOUTH DAILY 90 tablet 3  . terconazole (TERAZOL 7) 0.4 % vaginal cream Place 1 applicator vaginally at bedtime.    . Vaginal Lubricant (REPLENS VA) Place vaginally daily.    . Vitamin D, Ergocalciferol, (DRISDOL) 1.25 MG (50000 UNIT) CAPS capsule TAKE 1 CAPSULE BY MOUTH EVERY 7 DAYS 12 capsule 0  . zolpidem (AMBIEN) 10 MG tablet Take 10 mg by mouth at bedtime as needed for sleep.      No current facility-administered medications for this visit.    OBJECTIVE: white woman who appears younger than stated age  V49   04/12/20 1452  BP: (!) 148/69  Pulse: 68  Resp: 17  Temp: 97.8 F (36.6 C)  SpO2: 100%     Body mass index is 28.4 kg/m.  ECOG FS:1 - Symptomatic but completely ambulatory  Sclerae unicteric, EOMs intact Wearing a mask No cervical or supraclavicular adenopathy Lungs no rales or rhonchi Heart regular rate and rhythm Abd soft, nontender, positive bowel sounds MSK no focal spinal tenderness, no upper extremity lymphedema Neuro: nonfocal, well oriented, appropriate affect Breasts: The right breast is status post lumpectomy and radiation.  There are posttreatment changes as previously noted but  no evidence of local recurrence.  Left breast is benign.  Both axillae are benign Skin: The area of concern in the right forearm is considerably improved but not entirely cleared   Right upper extremity 01/15/2020     Right breast 08/15/2016     Right Breast 02/03/2018      LAB RESULTS:  CMP     Component Value Date/Time   NA 138 02/17/2020 1452   NA 138 03/11/2017 1524   K 4.3 02/17/2020 1452   K 3.8 03/11/2017 1524   CL 104 02/17/2020 1452   CO2 29 02/17/2020 1452   CO2 25 03/11/2017 1524   GLUCOSE 94 02/17/2020 1452   GLUCOSE 136 03/11/2017 1524   BUN 13 02/17/2020 1452   BUN 13.2 03/11/2017 1524   CREATININE 0.86 02/17/2020 1452   CREATININE 0.83 02/24/2019 1224   CREATININE 0.8 03/11/2017 1524   CALCIUM 9.4 02/17/2020 1452   CALCIUM 9.6 03/11/2017 1524   PROT 6.6 04/11/2020 1109   PROT 7.6 03/11/2017 1524   ALBUMIN 4.1 04/11/2020 1109   ALBUMIN 3.8 03/11/2017 1524   AST 18 04/11/2020 1109   AST 19 02/24/2019 1224   AST 24 03/11/2017 1524   ALT 15 04/11/2020 1109   ALT 15 02/24/2019 1224   ALT 38 03/11/2017 1524   ALKPHOS 82 04/11/2020 1109   ALKPHOS 166 (H) 03/11/2017 1524   BILITOT 0.6 04/11/2020 1109   BILITOT 0.5 02/24/2019 1224   BILITOT 0.44 03/11/2017 1524   GFRNONAA >60 02/17/2020 1452   GFRNONAA >60 02/24/2019 1224   GFRAA >60 01/15/2020 1457   GFRAA >60 02/24/2019 1224    INo results found for: SPEP, UPEP  Lab Results  Component Value Date   WBC 7.3 02/17/2020   NEUTROABS 5.0 02/17/2020   HGB 13.9 02/17/2020   HCT 40.8 02/17/2020   MCV 93.8 02/17/2020   PLT 209 02/17/2020      Chemistry      Component Value Date/Time   NA 138 02/17/2020 1452   NA 138 03/11/2017 1524   K 4.3 02/17/2020 1452   K 3.8 03/11/2017 1524   CL 104 02/17/2020 1452   CO2 29 02/17/2020 1452   CO2 25 03/11/2017 1524   BUN 13 02/17/2020 1452   BUN 13.2 03/11/2017 1524   CREATININE 0.86 02/17/2020 1452   CREATININE 0.83 02/24/2019 1224    CREATININE 0.8 03/11/2017 1524      Component Value Date/Time   CALCIUM 9.4 02/17/2020 1452   CALCIUM 9.6 03/11/2017 1524   ALKPHOS 82 04/11/2020 1109   ALKPHOS 166 (H) 03/11/2017 1524   AST 18 04/11/2020 1109   AST 19 02/24/2019 1224   AST 24 03/11/2017 1524   ALT 15 04/11/2020 1109   ALT 15 02/24/2019 1224   ALT 38 03/11/2017 1524   BILITOT 0.6 04/11/2020 1109   BILITOT 0.5 02/24/2019 1224   BILITOT 0.44 03/11/2017 1524       No results found for: LABCA2  No components found for: LABCA125  No results for input(s): INR in the last 168 hours.  Urinalysis  Component Value Date/Time   COLORURINE YELLOW 01/03/2017 1205   APPEARANCEUR HAZY (A) 01/03/2017 1205   LABSPEC 1.006 01/03/2017 1205   PHURINE 7.0 01/03/2017 1205   GLUCOSEU NEGATIVE 01/03/2017 1205   HGBUR SMALL (A) 01/03/2017 1205   BILIRUBINUR NEGATIVE 01/03/2017 1205   BILIRUBINUR n 10/01/2016 1316   KETONESUR NEGATIVE 01/03/2017 1205   PROTEINUR NEGATIVE 01/03/2017 1205   UROBILINOGEN 0.2 10/01/2016 1316   NITRITE NEGATIVE 01/03/2017 1205   LEUKOCYTESUR LARGE (A) 01/03/2017 1205    ELIGIBLE FOR AVAILABLE RESEARCH PROTOCOL: PALLAS   STUDIES: No results found.   ASSESSMENT: 74 y.o. [place] woman status post right breast upper outer quadrant and right axillary lymph node biopsy 08/23/2015, both positive for a clinical T1 N2, stage IIIA invasive ductal carcinoma, grade 2, strongly estrogen receptor positive, 5% progesterone receptor positive, with an MIB-120-30% and no HER-2 amplification  (a) biopsy of a 0.9 cm Right breast satellite nodule 09/13/2015 showed invasive ductal carcinoma, grade 2, E-cadherin strongly positive, with a prognostic panel identical to the larger right upper quadrant mass  (1) left breast biopsy 08/24/2015 shows a complex sclerosing lesion, excised 12/05/2015  (2) neoadjuvant chemotherapy consisting of cyclophosphamide and docetaxel every 21 days 4, with OnPro support, started  09/12/2015, completed 11/14/2015  (3) status post right lumpectomy and sentinel lymph node dissection 12/05/2015 for a residual pT2 pN3, stage IIIC invasive ductal carcinoma, grade 2, with a close anterior margin (skin)  (4) adjuvant radiation 01/18/16 - 11/6/17with capecitabine chemo-sensitization   1) Right breast/nodal areas: 45 Gy in 25 fractions                         2) Right breast boost: 16 Gy in 8 fractions  (5) capecitabine started 04/13/2016, discontinued January 2018 with mild/moderate [name] effects developing  (6) anastrozole started 06/01/2016  (a) bone density January 30, 2017 shows a T score of -2.7 (osteoporosis)  (7) genetics testing (Askenazi descent) 02/07/2016 through the Breast/Ovarian Cancer Panel offered by [facility] Hope Pigeon, MD) i found no deleterious mutations in  ATM, BARD1, BRCA1, BRCA2, BRIP1, CDH1, CHEK2, FANCC, MLH1, MSH2, MSH6, NBN, PALB2, PMS2, PTEN, RAD51C, RAD51D, TP53, and XRCC2.  This panel also includes deletion/duplication analysis (without sequencing) for one gene, EPCAM.  (8) consented to PALLAS trial   (a) randomized to anastrozole alone   PLAN: Hila is now 4-1/2 years out from definitive surgery for her breast cancer with no evidence of disease recurrence.  This is very favorable.  She is tolerating anastrozole well and the plan is to continue that a total of 7 years.  The home situation is currently very difficult because of her husband's rapid decline.  She does have support from her 2 daughters and is getting very good advice from her counselor as well as a legal counsel.  Anything I can do to help of course she knows I will be glad to do.  Otherwise I am giving her another week of Keflex hoping that completes the treatment of the right forearm cellulitis.  She is already scheduled to see me March 2022.  She knows to call for any other issues that may develop before then.  Total encounter time 25 minutes.*  Alysson Geist, Virgie Dad,  MD  04/12/20 5:22 PM Medical Oncology and Hematology [facility] [place],  [address] Tel. [phone]    Fax. [phone]   I, Wilburn Mylar, am acting as scribe for Dr. Virgie Dad. Khalila Buechner.  I,  Lurline Del MD, have reviewed the above documentation for accuracy and completeness, and I agree with the above.   *Total Encounter Time as defined by the Centers for Medicare and Medicaid Services includes, in addition to the face-to-face time of a patient visit (documented in the note above) non-face-to-face time: obtaining and reviewing outside history, ordering and reviewing medications, tests or procedures, care coordination (communications with other health care professionals or caregivers) and documentation in the medical record.

## 2020-04-12 ENCOUNTER — Other Ambulatory Visit: Payer: Self-pay

## 2020-04-12 ENCOUNTER — Inpatient Hospital Stay: Payer: Medicare Other | Attending: Oncology | Admitting: Oncology

## 2020-04-12 VITALS — BP 148/69 | HR 68 | Temp 97.8°F | Resp 17 | Ht 61.75 in | Wt 154.0 lb

## 2020-04-12 DIAGNOSIS — I2510 Atherosclerotic heart disease of native coronary artery without angina pectoris: Secondary | ICD-10-CM

## 2020-04-12 DIAGNOSIS — M810 Age-related osteoporosis without current pathological fracture: Secondary | ICD-10-CM | POA: Insufficient documentation

## 2020-04-12 DIAGNOSIS — Z170 Estrogen receptor positive status [ER+]: Secondary | ICD-10-CM | POA: Insufficient documentation

## 2020-04-12 DIAGNOSIS — Z9221 Personal history of antineoplastic chemotherapy: Secondary | ICD-10-CM | POA: Diagnosis not present

## 2020-04-12 DIAGNOSIS — C773 Secondary and unspecified malignant neoplasm of axilla and upper limb lymph nodes: Secondary | ICD-10-CM | POA: Diagnosis not present

## 2020-04-12 DIAGNOSIS — L03113 Cellulitis of right upper limb: Secondary | ICD-10-CM | POA: Insufficient documentation

## 2020-04-12 DIAGNOSIS — C50411 Malignant neoplasm of upper-outer quadrant of right female breast: Secondary | ICD-10-CM | POA: Insufficient documentation

## 2020-04-12 MED ORDER — CEPHALEXIN 500 MG PO CAPS: 500.0000 mg | ORAL_CAPSULE | Freq: Two times a day (BID) | ORAL | 0 refills | Status: DC

## 2020-04-12 MED ORDER — ANASTROZOLE 1 MG PO TABS
1.0000 mg | ORAL_TABLET | Freq: Every day | ORAL | 4 refills | Status: DC
Start: 2020-04-12 — End: 2021-01-12

## 2020-04-13 ENCOUNTER — Telehealth: Payer: Self-pay | Admitting: Oncology

## 2020-04-13 NOTE — Telephone Encounter (Signed)
No 12/14 los. No changes made to pt's schedule.  

## 2020-04-21 ENCOUNTER — Telehealth: Payer: Self-pay

## 2020-04-21 DIAGNOSIS — M5416 Radiculopathy, lumbar region: Secondary | ICD-10-CM

## 2020-04-21 NOTE — Telephone Encounter (Signed)
Patient called she stated she is having pain in both legs she would like a call back. She stated her pcp told her she needs to schedule an appointment with Dr.Newton. [phone]

## 2020-04-22 ENCOUNTER — Other Ambulatory Visit: Payer: Self-pay | Admitting: Cardiology

## 2020-04-22 ENCOUNTER — Other Ambulatory Visit: Payer: Self-pay | Admitting: Obstetrics & Gynecology

## 2020-04-25 NOTE — Telephone Encounter (Signed)
Bil L4 TF injection on 02/1120. Ok to repeat if helped, same problem/side, and no new injury?

## 2020-04-28 NOTE — Telephone Encounter (Signed)
I would like an MRI of her lumbar spine but she could also follow-up with Dr. Aldean Baker from orthopedic standpoint if she felt like this was more related to her hips.

## 2020-04-28 NOTE — Progress Notes (Signed)
BRECK HOLLINGER - 74 y.o. female MRN [id number]  Date of birth: 08-Dec-1945  Office Visit Note: Visit Date: 03/10/2020 PCP: Renford Dills, MD Referred by: Renford Dills, MD  Subjective: Chief Complaint  Patient presents with  . Lower Back - Pain  . Right Knee - Pain  . Left Knee - Pain  . Left Leg - Pain  . Right Leg - Pain   HPI:  KANAE IGNATOWSKI is a 74 y.o. female who comes in today for planned Bilateral L4-L5 Lumbar epidural steroid injection with fluoroscopic guidance.  The patient has failed conservative care including home exercise, medications, time and activity modification.  This injection will be diagnostic and hopefully therapeutic.  Please see requesting physician notes for further details and justification.   ROS Otherwise per HPI.  Assessment & Plan: Visit Diagnoses:    ICD-10-CM   1. Lumbar radiculopathy  M54.16 XR C-ARM NO REPORT    Epidural Steroid injection    methylPREDNISolone acetate (DEPO-MEDROL) injection 80 mg  2. Bilateral stenosis of lateral recess of lumbar spine  M48.061 XR C-ARM NO REPORT    Epidural Steroid injection    methylPREDNISolone acetate (DEPO-MEDROL) injection 80 mg    Plan: No additional findings.   Meds & Orders:  Meds ordered this encounter  Medications  . methylPREDNISolone acetate (DEPO-MEDROL) injection 80 mg    Orders Placed This Encounter  Procedures  . XR C-ARM NO REPORT  . Epidural Steroid injection    Follow-up: Return if symptoms worsen or fail to improve, for Consider Lumbar MRI.   Procedures: No procedures performed  Lumbosacral Transforaminal Epidural Steroid Injection - Sub-Pedicular Approach with Fluoroscopic Guidance  Patient: KAHLEY LEIB      Date of Birth: [birth year] MRN: [id number] PCP: Renford Dills, MD      Visit Date: 03/10/2020   Universal Protocol:    Date/Time: 03/10/2020  Consent Given By: the patient  Position: PRONE  Additional Comments: Vital signs were monitored before and  after the procedure. Patient was prepped and draped in the usual sterile fashion. The correct patient, procedure, and site was verified.   Injection Procedure Details:   Procedure diagnoses: Lumbar radiculopathy [M54.16]    Meds Administered:  Meds ordered this encounter  Medications  . methylPREDNISolone acetate (DEPO-MEDROL) injection 80 mg    Laterality: Bilateral  Location/Site:  L4-L5  Needle:5.0 in., 22 ga.  Short bevel or Quincke spinal needle  Needle Placement: Transforaminal  Findings:    -Comments: Excellent flow of contrast along the nerve, nerve root and into the epidural space.  Procedure Details: After squaring off the end-plates to get a true AP view, the C-arm was positioned so that an oblique view of the foramen as noted above was visualized. The target area is just inferior to the "nose of the scotty dog" or sub pedicular. The soft tissues overlying this structure were infiltrated with 2-3 ml. of 1% Lidocaine without Epinephrine.  The spinal needle was inserted toward the target using a "trajectory" view along the fluoroscope beam.  Under AP and lateral visualization, the needle was advanced so it did not puncture dura and was located close the 6 O'Clock position of the pedical in AP tracterory. Biplanar projections were used to confirm position. Aspiration was confirmed to be negative for CSF and/or blood. A 1-2 ml. volume of Isovue-250 was injected and flow of contrast was noted at each level. Radiographs were obtained for documentation purposes.   After attaining the desired flow of contrast documented above,  a 0.5 to 1.0 ml test dose of 0.25% Marcaine was injected into each respective transforaminal space.  The patient was observed for 90 seconds post injection.  After no sensory deficits were reported, and normal lower extremity motor function was noted,   the above injectate was administered so that equal amounts of the injectate were placed at each foramen  (level) into the transforaminal epidural space.   Additional Comments:  The patient tolerated the procedure well Dressing: 2 x 2 sterile gauze and Band-Aid    Post-procedure details: Patient was observed during the procedure. Post-procedure instructions were reviewed.  Patient left the clinic in stable condition.      Clinical History: MRI LUMBAR SPINE WITHOUT CONTRAST SOS   TECHNIQUE: Multiplanar, multisequence MR imaging of the lumbar spine was performed. No intravenous contrast was administered.   COMPARISON: None.   FINDINGS: The vertebral bodies of the lumbar spine are normal in size. The vertebral bodies of the lumbar spine are normal in alignment. Marrow heterogeneity is present. Although this can be caused by marrow infiltrative processes, the most common causes include anemia, smoking, obesity, or advancing age. There is degenerative disc disease with disc desiccation and disc height loss at L5-S1. There is disc desiccation throughout the remainder of the lumbar spine.   The spinal cord is normal in signal and contour. The cord terminates normally at L1 . The nerve roots of the cauda equina and the filum terminale are normal.   The visualized portions of the SI joints are unremarkable.   The imaged intra-abdominal contents are unremarkable.   T12-L1: No significant disc bulge. No evidence of neural foraminal stenosis. No central canal stenosis.   L1-L2: Mild broad-based disc bulge. No evidence of neural foraminal stenosis. No central canal stenosis.   L2-L3: Mild broad-based disc bulge. No evidence of neural foraminal stenosis. No central canal stenosis.   L3-L4: Mild broad-based disc bulge. No evidence of neural foraminal stenosis. No central canal stenosis.   L4-L5: Mild broad-based disc bulge. Mild bilateral lateral recess stenosis. No evidence of neural foraminal stenosis. No central canal stenosis. Mild bilateral facet arthropathy.   L5-S1:  Mild broad-based disc bulge. Mild bilateral facet arthropathy. No evidence of neural foraminal stenosis. No central canal stenosis.   IMPRESSION: 1. Mild broad-based disc bulges throughout the lumbar spine. 2. Mild bilateral facet arthropathy with mild bilateral lateral recess stenosis at L4-5.     Electronically Signed  By: Kathreen Devoid  On: 01/29/2014 12:05     Objective:  VS:  HT:    WT:   BMI:     BP:(!) 155/86  HR:67bpm  TEMP: ( )  RESP:  Physical Exam Constitutional:      General: She is not in acute distress.    Appearance: Normal appearance. She is not ill-appearing.  HENT:     Head: Normocephalic and atraumatic.     Right Ear: External ear normal.     Left Ear: External ear normal.  Eyes:     Extraocular Movements: Extraocular movements intact.  Cardiovascular:     Rate and Rhythm: Normal rate.     Pulses: Normal pulses.  Musculoskeletal:     Right lower leg: No edema.     Left lower leg: No edema.     Comments: Patient has good distal strength with no pain over the greater trochanters.  No clonus or focal weakness.  Skin:    Findings: No erythema, lesion or rash.  Neurological:     General: No focal  deficit present.     Mental Status: She is alert and oriented to person, place, and time.     Sensory: No sensory deficit.     Motor: No weakness or abnormal muscle tone.     Coordination: Coordination normal.  Psychiatric:        Mood and Affect: Mood normal.        Behavior: Behavior normal.      Imaging: No results found.

## 2020-04-28 NOTE — Telephone Encounter (Signed)
Left message #1

## 2020-04-28 NOTE — Procedures (Signed)
Lumbosacral Transforaminal Epidural Steroid Injection - Sub-Pedicular Approach with Fluoroscopic Guidance  Patient: [name]      Date of Birth: 03-02-1946 MRN: [id number] PCP: Renford Dills, MD      Visit Date: 03/10/2020   Universal Protocol:    Date/Time: 03/10/2020  Consent Given By: the patient  Position: PRONE  Additional Comments: Vital signs were monitored before and after the procedure. Patient was prepped and draped in the usual sterile fashion. The correct patient, procedure, and site was verified.   Injection Procedure Details:   Procedure diagnoses: Lumbar radiculopathy [M54.16]    Meds Administered:  Meds ordered this encounter  Medications  . methylPREDNISolone acetate (DEPO-MEDROL) injection 80 mg    Laterality: Bilateral  Location/Site:  L4-L5  Needle:5.0 in., 22 ga.  Short bevel or Quincke spinal needle  Needle Placement: Transforaminal  Findings:    -Comments: Excellent flow of contrast along the nerve, nerve root and into the epidural space.  Procedure Details: After squaring off the end-plates to get a true AP view, the C-arm was positioned so that an oblique view of the foramen as noted above was visualized. The target area is just inferior to the "nose of the scotty dog" or sub pedicular. The soft tissues overlying this structure were infiltrated with 2-3 ml. of 1% Lidocaine without Epinephrine.  The spinal needle was inserted toward the target using a "trajectory" view along the fluoroscope beam.  Under AP and lateral visualization, the needle was advanced so it did not puncture dura and was located close the 6 O'Clock position of the pedical in AP tracterory. Biplanar projections were used to confirm position. Aspiration was confirmed to be negative for CSF and/or blood. A 1-2 ml. volume of Isovue-250 was injected and flow of contrast was noted at each level. Radiographs were obtained for documentation purposes.   After attaining the  desired flow of contrast documented above, a 0.5 to 1.0 ml test dose of 0.25% Marcaine was injected into each respective transforaminal space.  The patient was observed for 90 seconds post injection.  After no sensory deficits were reported, and normal lower extremity motor function was noted,   the above injectate was administered so that equal amounts of the injectate were placed at each foramen (level) into the transforaminal epidural space.   Additional Comments:  The patient tolerated the procedure well Dressing: 2 x 2 sterile gauze and Band-Aid    Post-procedure details: Patient was observed during the procedure. Post-procedure instructions were reviewed.  Patient left the clinic in stable condition.

## 2020-05-02 ENCOUNTER — Telehealth: Payer: Self-pay | Admitting: Physical Medicine and Rehabilitation

## 2020-05-02 NOTE — Telephone Encounter (Signed)
See previous message

## 2020-05-02 NOTE — Telephone Encounter (Signed)
MRI ordered

## 2020-05-02 NOTE — Addendum Note (Signed)
Addended by: Cindie Crumbly B on: 05/02/2020 01:09 PM   Modules accepted: Orders

## 2020-05-02 NOTE — Telephone Encounter (Signed)
Patient returning a call to [place]. Please call pt back to set appt. Pt phone number is [phone].

## 2020-05-03 ENCOUNTER — Other Ambulatory Visit: Payer: Self-pay

## 2020-05-03 MED ORDER — FUROSEMIDE 20 MG PO TABS: ORAL_TABLET | ORAL | 10 refills | Status: DC

## 2020-05-03 MED ORDER — POTASSIUM CHLORIDE ER 10 MEQ PO TBCR: EXTENDED_RELEASE_TABLET | ORAL | 10 refills | Status: DC

## 2020-05-07 ENCOUNTER — Other Ambulatory Visit: Payer: Self-pay

## 2020-05-07 ENCOUNTER — Ambulatory Visit
Admission: RE | Admit: 2020-05-07 | Discharge: 2020-05-07 | Disposition: A | Discharge status: Home / Self Care | Payer: Medicare Other | Source: Ambulatory Visit | Attending: Physical Medicine and Rehabilitation | Admitting: Physical Medicine and Rehabilitation

## 2020-05-07 DIAGNOSIS — M5416 Radiculopathy, lumbar region: Secondary | ICD-10-CM

## 2020-05-12 ENCOUNTER — Ambulatory Visit (INDEPENDENT_AMBULATORY_CARE_PROVIDER_SITE_OTHER): Payer: Medicare Other | Admitting: Orthopedic Surgery

## 2020-05-12 DIAGNOSIS — M5416 Radiculopathy, lumbar region: Secondary | ICD-10-CM | POA: Diagnosis not present

## 2020-05-12 MED ORDER — PREDNISONE 10 MG PO TABS: 20.0000 mg | ORAL_TABLET | Freq: Every day | ORAL | 0 refills | Status: DC

## 2020-05-13 ENCOUNTER — Encounter: Payer: Self-pay | Admitting: Orthopedic Surgery

## 2020-05-13 NOTE — Progress Notes (Signed)
Office Visit Note   Patient: [name]           Date of Birth: [birth year]           MRN: [id number] Visit Date: 05/12/2020              Requested by: Seward Carol, MD [address] [place],   [address] PCP: Seward Carol, MD  Chief Complaint  Patient presents with  . Right Leg - Pain  . Left Leg - Pain  . Lower Back - Pain      HPI: Patient is a 74 year old woman who is seen in follow-up for lower back pain with radicular symptoms.  Patient is status post recent MRI scan.  Assessment & Plan: Visit Diagnoses:  1. Radiculopathy, lumbar region     Plan: Patient will follow-up with Dr. Ernestina Patches for evaluation for epidural steroid injections.  Discussed prednisone versus Celebrex for her radicular symptoms.  Patient states she would like to proceed with prednisone she will start with 20 mg with breakfast and wean off as instructed.  She has stopped her Neurontin stating that this did not help she states she takes Vicodin for the fibromyalgia and Soma for sleep at night.  Recommend that she try a silicone spacer for the recurrence of the left great toe hallux valgus.  Follow-Up Instructions: Return if symptoms worsen or fail to improve.   Ortho Exam  Patient is alert, oriented, no adenopathy, well-dressed, normal affect, normal respiratory effort. Examination patient has pain sitting to standing which she describes as excruciating she has the pain starts from the posterior buttocks radiates to the lateral thigh to the lateral leg.  She has a positive straight leg raise on the right negative on the left motor strength is symmetric in all motor groups with hip flexion foot plantar flexion dorsiflexion and extension of the toes.  Imaging: No results found. No images are attached to the encounter.  Labs: Lab Results  Component Value Date   HGBA1C 5.4 11/04/2017   HGBA1C 5.2 05/29/2017   HGBA1C 4.9 01/03/2017   REPTSTATUS 01/05/2017 FINAL 01/03/2017    CULT (A) 01/03/2017    40,000 COLONIES/mL ESCHERICHIA COLI Confirmed Extended Spectrum Beta-Lactamase Producer (ESBL) 80,000 COLONIES/mL ENTEROCOCCUS FAECALIS    LABORGA ESCHERICHIA COLI (A) 01/03/2017   LABORGA ENTEROCOCCUS FAECALIS (A) 01/03/2017     Lab Results  Component Value Date   ALBUMIN 4.1 04/11/2020   ALBUMIN 3.7 02/17/2020   ALBUMIN 3.7 01/15/2020    No results found for: MG Lab Results  Component Value Date   VD25OH 34 03/21/2015   VD25OH 30 03/18/2014   VD25OH 58 02/06/2013    No results found for: PREALBUMIN CBC EXTENDED Latest Ref Rng & Units 02/17/2020 01/15/2020 08/04/2019  WBC 4.0 - 10.5 K/uL 7.3 7.3 6.8  RBC 3.87 - 5.11 MIL/uL 4.35 4.22 4.17  HGB 12.0 - 15.0 g/dL 13.9 13.7 13.4  HCT 36.0 - 46.0 % 40.8 41.1 40.5  PLT 150 - 400 K/uL 209 218 233  NEUTROABS 1.7 - 7.7 K/uL 5.0 5.3 4.6  LYMPHSABS 0.7 - 4.0 K/uL 1.7 1.4 1.5     There is no height or weight on file to calculate BMI.  Orders:  No orders of the defined types were placed in this encounter.  Meds ordered this encounter  Medications  . predniSONE (DELTASONE) 10 MG tablet    Sig: Take 2 tablets (20 mg total) by mouth daily with breakfast.    Dispense:  60 tablet    Refill:  0     Procedures: No procedures performed  Clinical Data: No additional findings.  ROS:  All other systems negative, except as noted in the HPI. Review of Systems  Objective: Vital Signs: LMP 04/30/1996 (Approximate)   Specialty Comments:  No specialty comments available.  PMFS History: Patient Active Problem List   Diagnosis Date Noted  . Lumbar radiculopathy 05/11/2019  . Bilateral stenosis of lateral recess of lumbar spine 05/11/2019  . Hyperlipidemia 12/23/2018  . Chronic diastolic CHF (congestive heart failure) ([facility]) 11/24/2018  . Abnormal transaminases 03/24/2018  . CAD (coronary artery disease) 02/11/2017  . 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 Status post coronary artery stent placement   .  Non-ST elevation (NSTEMI) myocardial infarction ([place])   . Depression with anxiety 12/31/2016  . Chest pain 12/31/2016  . Hyponatremia 12/31/2016  . Chronic pain 12/31/2016  . Essential hypertension 12/31/2016  . Unstable angina ([place])   . Family history of breast cancer in female 02/08/2016  . Rash and nonspecific skin eruption 10/03/2015  . 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 Malignant neoplasm of upper-outer quadrant of right breast in female, estrogen receptor positive ([place]) 09/01/2015  . 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 Asthma 09/12/2011  . Menopausal symptoms   . Chronic vaginitis   . Fibromyalgia   . Interstitial cystitis   . UNSPECIFIED HYPOTHYROIDISM 06/14/2009  . DEPRESSIVE DISORDER NOT ELSEWHERE CLASSIFIED 06/14/2009  . Hereditary and idiopathic peripheral neuropathy 06/14/2009  . UNSPEC HEMORRHOIDS WITHOUT MENTION COMPLICATION 11/30/2334  . ALLERGIC RHINITIS CAUSE UNSPECIFIED 06/14/2009  . ESOPHAGEAL REFLUX 06/14/2009  . Irritable bowel syndrome 06/14/2009  . INSOMNIA UNSPECIFIED 06/14/2009  . Osteoporosis 08/27/2007   Past Medical History:  Diagnosis Date  . Anxiety   . Arthritis 2008  . Asthma 1953   age 70  . Breast cancer ([place]) dx'd 2017   right  . Cancer [facility])    breast cancer  . Chronic vaginitis 1998  . Fibromyalgia 1982  . GERD (gastroesophageal reflux disease)    since at least 2011  . Hemorrhoids 2009  . History of radiation therapy 01/18/16-03/05/16   right breast/nodal areas 45 Gy, right breast boost 16 Gy  . Hypoglycemia 2003  . Hypothyroidism 1964   age 33  . Interstitial cystitis 1998  . Irritable bowel syndrome 06/14/2009   Qualifier: Diagnosis of  By: Patsy Baltimore RN, Langley Gauss    . Menopausal symptoms   . Osteoporosis 12/24/2006  . Personal history of chemotherapy   . Personal history of radiation therapy   . Pneumonia 1979   varicella  . PONV (postoperative nausea and vomiting) 1979    Family History   Problem Relation Age of Onset  . Hypertension Mother   . Heart disease Mother        d. 43  . Parkinsonism Father        d. 45  . Heart disease Sister   . Breast cancer Sister 77       s/p mastectomy; reportedly negative BRCA1/2 testing approx 7 years ago  . Melanoma Sister        d. 67s; hx of incresed sun exposure  . Cancer Sister        dx. sarcoma of her face in her 20s; treated w/ MOHS surgery  . Parkinsonism Paternal Aunt        d. 90s  . Parkinsonism Paternal Uncle        d. late  7s  . Kidney failure Maternal Grandmother 28  . Heart attack Maternal Grandfather        d. 51s  . Heart disease Paternal Grandmother        d. 49  . Breast cancer Other 7       maternal great aunt (MGF's sister)  . Kidney cancer Paternal Uncle 48       d. 64s; unsure about smoking status    Past Surgical History:  Procedure Laterality Date  . BLADDER HYPEREXTENSION     X4, prior to 01/2006  . BLADDER SUSPENSION    . BREAST BIOPSY    . BREAST LUMPECTOMY Right    2017  . BREAST LUMPECTOMY WITH NEEDLE LOCALIZATION AND AXILLARY LYMPH NODE DISSECTION Right 12/05/2015   Procedure: RIGHT BREAST NEEDLE LOCALIZED (WIRES X 2) LUMPECTOMY WITH AXILLARY LYMPH NODE DISSECTION;  Surgeon: Fanny Skates, MD;  Location: [facility];  Service: General;  Laterality: Right;  . BREAST LUMPECTOMY WITH RADIOACTIVE SEED LOCALIZATION Left 12/05/2015   Procedure: RADIOACTIVE SEED GUIDED LEFT BREAST LUMPECTOMY;  Surgeon: Fanny Skates, MD;  Location: [facility];  Service: General;  Laterality: Left;  . BREAST SURGERY    . BUNIONECTOMY     prior to 01/2006  . CARDIAC CATHETERIZATION    . CESAREAN SECTION  1979  . CHOLECYSTECTOMY  02/08/2006   DR. INGRAM  . COLONOSCOPY    . CORONARY STENT INTERVENTION N/A 01/01/2017   Procedure: CORONARY STENT INTERVENTION;  Surgeon: Jettie Booze, MD;  Location: [facility];  Service: Cardiovascular;  Laterality: N/A;  . DENTAL SURGERY  2009-2016   13 dental implants  . LEFT  HEART CATH AND CORONARY ANGIOGRAPHY N/A 01/01/2017   Procedure: LEFT HEART CATH AND CORONARY ANGIOGRAPHY;  Surgeon: Jettie Booze, MD;  Location: [facility];  Service: Cardiovascular;  Laterality: N/A;  . LEG SURGERY  07/06/2004   Broken tibia  . NASAL SINUS SURGERY     X4; prior to 01/2006  . PORT-A-CATH REMOVAL Right 12/05/2015   Procedure: REMOVAL PORT-A-CATH;  Surgeon: Fanny Skates, MD;  Location: [facility];  Service: General;  Laterality: Right;  . PORTACATH PLACEMENT Left 09/08/2015   Procedure: INSERTION OF PORT-A-CATH ;  Surgeon: Fanny Skates, MD;  Location: [facility];  Service: General;  Laterality: Left;   Social History   Occupational History  . Not on file  Tobacco Use  . Smoking status: Former Smoker    Packs/day: 0.25    Years: 15.00    Pack years: 3.75    Types: Cigarettes  . Smokeless tobacco: Never Used  . Tobacco comment: social smoker/"on and off"  Vaping Use  . Vaping Use: Never used  Substance and Sexual Activity  . Alcohol use: Yes    Alcohol/week: 0.0 standard drinks    Comment: social  . Drug use: No  . Sexual activity: Not Currently    Birth control/protection: Post-menopausal

## 2020-05-14 ENCOUNTER — Other Ambulatory Visit: Payer: Medicare Other

## 2020-05-19 ENCOUNTER — Encounter: Payer: Self-pay | Admitting: Physical Medicine and Rehabilitation

## 2020-05-19 ENCOUNTER — Ambulatory Visit (INDEPENDENT_AMBULATORY_CARE_PROVIDER_SITE_OTHER): Payer: Medicare Other | Admitting: Physical Medicine and Rehabilitation

## 2020-05-19 ENCOUNTER — Other Ambulatory Visit: Payer: Self-pay

## 2020-05-19 ENCOUNTER — Ambulatory Visit: Payer: Self-pay

## 2020-05-19 VITALS — BP 153/82 | HR 68

## 2020-05-19 DIAGNOSIS — M419 Scoliosis, unspecified: Secondary | ICD-10-CM

## 2020-05-19 DIAGNOSIS — M48062 Spinal stenosis, lumbar region with neurogenic claudication: Secondary | ICD-10-CM

## 2020-05-19 DIAGNOSIS — M5416 Radiculopathy, lumbar region: Secondary | ICD-10-CM | POA: Diagnosis not present

## 2020-05-19 MED ORDER — METHYLPREDNISOLONE ACETATE 80 MG/ML IJ SUSP
80.0000 mg | Freq: Once | INTRAMUSCULAR | Status: AC
  Administered 2020-05-19: 80 mg

## 2020-05-19 NOTE — Patient Instructions (Signed)

## 2020-05-19 NOTE — Progress Notes (Signed)
Pt state lower back pain that travels down both legs. Pt state walking, standing and laying makes the pain worse. Pt state takes pain meds to help ease the pain.    Numeric Pain Rating Scale and Functional Assessment Average Pain 6   In the last MONTH (on 0-10 scale) has pain interfered with the following?  1. General activity like being  able to carry out your everyday physical activities such as walking, climbing stairs, carrying groceries, or moving a chair?  Rating(10)   +Driver, -BT, -Dye Allergies.

## 2020-05-20 NOTE — Procedures (Signed)
Lumbosacral Transforaminal Epidural Steroid Injection - Sub-Pedicular Approach with Fluoroscopic Guidance  Patient: [name]      Date of Birth: Jul 02, 1945 MRN: [id number] PCP: Seward Carol, MD      Visit Date: 05/19/2020   Universal Protocol:    Date/Time: 05/19/2020  Consent Given By: the patient  Position: PRONE  Additional Comments: Vital signs were monitored before and after the procedure. Patient was prepped and draped in the usual sterile fashion. The correct patient, procedure, and site was verified.   Injection Procedure Details:   Procedure diagnoses: Spinal stenosis of lumbar region with neurogenic claudication [M48.062]    Meds Administered:  Meds ordered this encounter  Medications   methylPREDNISolone acetate (DEPO-MEDROL) injection 80 mg    Laterality: Bilateral  Location/Site:  L4-L5  Needle:5.0 in., 22 ga.  Short bevel or Quincke spinal needle  Needle Placement: Transforaminal  Findings:    -Comments: Excellent flow of contrast along the nerve, nerve root and into the epidural space.  Procedure Details: After squaring off the end-plates to get a true AP view, the C-arm was positioned so that an oblique view of the foramen as noted above was visualized. The target area is just inferior to the "nose of the scotty dog" or sub pedicular. The soft tissues overlying this structure were infiltrated with 2-3 ml. of 1% Lidocaine without Epinephrine.  The spinal needle was inserted toward the target using a "trajectory" view along the fluoroscope beam.  Under AP and lateral visualization, the needle was advanced so it did not puncture dura and was located close the 6 O'Clock position of the pedical in AP tracterory. Biplanar projections were used to confirm position. Aspiration was confirmed to be negative for CSF and/or blood. A 1-2 ml. volume of Isovue-250 was injected and flow of contrast was noted at each level. Radiographs were obtained for  documentation purposes.   After attaining the desired flow of contrast documented above, a 0.5 to 1.0 ml test dose of 0.25% Marcaine was injected into each respective transforaminal space.  The patient was observed for 90 seconds post injection.  After no sensory deficits were reported, and normal lower extremity motor function was noted,   the above injectate was administered so that equal amounts of the injectate were placed at each foramen (level) into the transforaminal epidural space.   Additional Comments:  The patient tolerated the procedure well Dressing: 2 x 2 sterile gauze and Band-Aid    Post-procedure details: Patient was observed during the procedure. Post-procedure instructions were reviewed.  Patient left the clinic in stable condition.

## 2020-05-20 NOTE — Progress Notes (Addendum)
[name] - 75 y.o. female MRN [id number]  Date of birth: Feb 01, 1946  Office Visit Note: Visit Date: 05/19/2020 PCP: Brianna Carol, MD Referred by: Brianna Carol, MD  Subjective: Chief Complaint  Patient presents with  . Lower Back - Pain  . Left Leg - Pain  . Right Leg - Pain   HPI:  [name] is a 75 y.o. female who comes in today at the request of Dr. Meridee [name] for planned Bilateral L4-L5 Lumbar epidural steroid injection with fluoroscopic guidance.  The patient has failed conservative care including home exercise, medications, time and activity modification.  This injection will be diagnostic and hopefully therapeutic.  Please see requesting physician notes for further details and justification.  Prior notes can be reviewed.  Patient has done well intermittently with bilateral L4 transforaminal injection for quite some time.  She has history of multilevel spondylitic arthropathy and significant scoliosis.  She had had ongoing moderate narrowing at L4-5 with lateral recess narrowing and this was consistent with her symptoms.  Last injection performed did not do as well as normal but part of this she relates to an issue with her husband who now has dementia and they are going through some issues at home with trying to get caregivers etc.  We did update her MRI because the prior MRI was quite old at this point even though she has had various imaging studies over the years looking at her history of cancer.  New MRI was obtained and this is reviewed with her which is shows some progression of the disease already noted without any high-grade nerve compression.  She did see Dr. Meridee [name] recently from an orthopedic standpoint looking at her knees.  According to the notes and the patient Dr. Sharol [name] felt like her knee pain may be related more specifically to her spine and not really her knees at this point.  Her case is complicated by fibromyalgia which has been a long history with  her.  ROS Otherwise per HPI.  Assessment & Plan: Visit Diagnoses:    ICD-10-CM   1. Spinal stenosis of lumbar region with neurogenic claudication  M48.062 XR C-ARM NO REPORT    Epidural Steroid injection    methylPREDNISolone acetate (DEPO-MEDROL) injection 80 mg  2. Lumbar radiculopathy  M54.16 XR C-ARM NO REPORT    Epidural Steroid injection    methylPREDNISolone acetate (DEPO-MEDROL) injection 80 mg  3. Scoliosis of lumbar spine, unspecified scoliosis type  M41.9 XR C-ARM NO REPORT    Epidural Steroid injection    methylPREDNISolone acetate (DEPO-MEDROL) injection 80 mg    Plan: No additional findings.   Meds & Orders:  Meds ordered this encounter  Medications  . methylPREDNISolone acetate (DEPO-MEDROL) injection 80 mg    Orders Placed This Encounter  Procedures  . XR C-ARM NO REPORT  . Epidural Steroid injection    Follow-up: Return if symptoms worsen or fail to improve.   Procedures: No procedures performed  Lumbosacral Transforaminal Epidural Steroid Injection - Sub-Pedicular Approach with Fluoroscopic Guidance  Patient: [name]      Date of Birth: January 04, 1946 MRN: [id number] PCP: Brianna Carol, MD      Visit Date: 05/19/2020   Universal Protocol:    Date/Time: 05/19/2020  Consent [name] By: the patient  Position: PRONE  Additional Comments: Vital signs were monitored before and after the procedure. Patient was prepped and draped in the usual sterile fashion. The correct patient, procedure, and site was verified.   Injection  Procedure Details:   Procedure diagnoses: Spinal stenosis of lumbar region with neurogenic claudication [M48.062]    Meds Administered:  Meds ordered this encounter  Medications  . methylPREDNISolone acetate (DEPO-MEDROL) injection 80 mg    Laterality: Bilateral  Location/Site:  L4-L5  Needle:5.0 in., 22 ga.  Short bevel or Quincke spinal needle  Needle Placement: Transforaminal  Findings:    -Comments:  Excellent flow of contrast along the nerve, nerve root and into the epidural space.  Procedure Details: After squaring off the end-plates to get a true AP view, the C-arm was positioned so that an oblique view of the foramen as noted above was visualized. The target area is just inferior to the "nose of the scotty dog" or sub pedicular. The soft tissues overlying this structure were infiltrated with 2-3 ml. of 1% Lidocaine without Epinephrine.  The spinal needle was inserted toward the target using a "trajectory" view along the fluoroscope beam.  Under AP and lateral visualization, the needle was advanced so it did not puncture dura and was located close the 6 O'Clock position of the pedical in AP tracterory. Biplanar projections were used to confirm position. Aspiration was confirmed to be negative for CSF and/or blood. A 1-2 ml. volume of Isovue-250 was injected and flow of contrast was noted at each level. Radiographs were obtained for documentation purposes.   After attaining the desired flow of contrast documented above, a 0.5 to 1.0 ml test dose of 0.25% Marcaine was injected into each respective transforaminal space.  The patient was observed for 90 seconds post injection.  After no sensory deficits were reported, and normal lower extremity motor function was noted,   the above injectate was administered so that equal amounts of the injectate were placed at each foramen (level) into the transforaminal epidural space.   Additional Comments:  The patient tolerated the procedure well Dressing: 2 x 2 sterile gauze and Band-Aid    Post-procedure details: Patient was observed during the procedure. Post-procedure instructions were reviewed.  Patient left the clinic in stable condition.      Clinical History: MRI LUMBAR SPINE WITHOUT CONTRAST  TECHNIQUE: Multiplanar, multisequence MR imaging of the lumbar spine was performed. No intravenous contrast was administered.  COMPARISON:   01/29/2014  FINDINGS: Segmentation:  5 lumbar type vertebrae  Alignment: Levoscoliosis. Grade 1 anterolisthesis at L4-5. Mild retrolisthesis at L1-2 and L2-3. Misalignment has progressed from 2015.  Vertebrae:  No fracture, evidence of discitis, or bone lesion.  Conus medullaris and cauda equina: Conus extends to the L1-2 level. Conus and cauda equina appear normal. 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  Disc levels:  T12- L1: Disc narrowing and ventral spurring.  No impingement  L1-L2: Rightward disc collapse with far-lateral bulging and ridging. Asymmetric right facet spurring. Moderate right foraminal narrowing. Noncompressive right subarticular recess stenosis.  L2-L3: Disc narrowing and bulging with retrolisthesis. Mild facet spurring. Mild to moderate left foraminal narrowing.  L3-L4: Disc narrowing and left eccentric bulging. Degenerative facet spurring asymmetric to the left. Mild left subarticular recess stenosis.  L4-L5: Facet osteoarthritis with spurring and anterolisthesis. Moderate disc space narrowing with mild bulging. Bilateral root sleeve cysts. Mild to moderate spinal stenosis with asymmetric left subarticular recess narrowing  L5-S1:Disc narrowing and minor bulging. Mild facet spurring. No neural compression.  IMPRESSION: 1. Diffuse disc and facet degeneration with scoliosis and multilevel listhesis, progressed from 2015. 2. Mild to moderate foraminal narrowing on the  right at L1-2 and left at L2-3. 3. Mild to moderate spinal stenosis at L4-5 with asymmetric left subarticular recess narrowing.   Electronically Signed   By: Monte Fantasia M.D.   On: 05/08/2020 07:15 ---- MRI LUMBAR SPINE WITHOUT CONTRAST  IMPRESSION: 1. Mild broad-based disc bulges throughout the lumbar spine. 2. Mild bilateral facet arthropathy with mild bilateral lateral recess stenosis  at L4-5.   Electronically Signed By: Kathreen Devoid On: 01/29/2014 12:05     Objective:  VS:  HT:    WT:   BMI:     BP:(!) 153/82  HR:68bpm  TEMP: ( )  RESP:  Physical Exam Vitals and nursing note reviewed.  Constitutional:      General: She is not in acute distress.    Appearance: Normal appearance. She is not ill-appearing.  HENT:     Head: Normocephalic and atraumatic.     Right Ear: External ear normal.     Left Ear: External ear normal.  Eyes:     Extraocular Movements: Extraocular movements intact.  Cardiovascular:     Rate and Rhythm: Normal rate.     Pulses: Normal pulses.  Pulmonary:     Effort: Pulmonary effort is normal. No respiratory distress.  Abdominal:     General: There is no distension.     Palpations: Abdomen is soft.  Musculoskeletal:        General: Tenderness present.     Cervical back: Neck supple.     Right lower leg: No edema.     Left lower leg: No edema.     Comments: Patient has good distal strength with no pain over the greater trochanters.  No clonus or focal weakness.  Skin:    Findings: No erythema, lesion or rash.  Neurological:     General: No focal deficit present.     Mental Status: She is alert and oriented to person, place, and time.     Sensory: No sensory deficit.     Motor: No weakness or abnormal muscle tone.     Coordination: Coordination normal.  Psychiatric:        Mood and Affect: Mood normal.        Behavior: Behavior normal.      Imaging: Epidural Steroid injection  Result Date: 05/19/2020 Magnus Sinning, MD     05/20/2020  9:48 AM Lumbosacral Transforaminal Epidural Steroid Injection - Sub-Pedicular Approach with Fluoroscopic Guidance Patient: [name]     Date of Birth: [birth year] MRN: [id number] PCP: Brianna Carol, MD     Visit Date: 05/19/2020  Universal Protocol:   Date/Time: 05/19/2020 Consent [name] By: the patient Position: PRONE Additional Comments: Vital signs were monitored before and after the  procedure. Patient was prepped and draped in the usual sterile fashion. The correct patient, procedure, and site was verified. Injection Procedure Details: Procedure diagnoses: Spinal stenosis of lumbar region with neurogenic claudication [M48.062]  Meds Administered: Meds ordered this encounter Medications . methylPREDNISolone acetate (DEPO-MEDROL) injection 80 mg Laterality: Bilateral Location/Site: L4-L5 Needle:5.0 in., 22 ga.  Short bevel or Quincke spinal needle Needle Placement: Transforaminal Findings:   -Comments: Excellent flow of contrast along the nerve, nerve root and into the epidural space. Procedure Details: After squaring off the end-plates to get a true AP view, the C-arm was positioned so that an oblique view of the foramen as noted above was visualized. The target area is just inferior to the "nose of the scotty dog" or sub pedicular. The soft tissues overlying this structure  were infiltrated with 2-3 ml. of 1% Lidocaine without Epinephrine. The spinal needle was inserted toward the target using a "trajectory" view along the fluoroscope beam.  Under AP and lateral visualization, the needle was advanced so it did not puncture dura and was located close the 6 O'Clock position of the pedical in AP tracterory. Biplanar projections were used to confirm position. Aspiration was confirmed to be negative for CSF and/or blood. A 1-2 ml. volume of Isovue-250 was injected and flow of contrast was noted at each level. Radiographs were obtained for documentation purposes. After attaining the desired flow of contrast documented above, a 0.5 to 1.0 ml test dose of 0.25% Marcaine was injected into each respective transforaminal space.  The patient was observed for 90 seconds post injection.  After no sensory deficits were reported, and normal lower extremity motor function was noted,   the above injectate was administered so that equal amounts of the injectate were placed at each foramen (level) into the  transforaminal epidural space. Additional Comments: The patient tolerated the procedure well Dressing: 2 x 2 sterile gauze and Band-Aid  Post-procedure details: Patient was observed during the procedure. Post-procedure instructions were reviewed. Patient left the clinic in stable condition.   XR C-ARM NO REPORT  Result Date: 05/19/2020 Please see Notes tab for imaging impression.

## 2020-05-31 ENCOUNTER — Other Ambulatory Visit: Payer: Self-pay | Admitting: Oncology

## 2020-07-06 NOTE — Progress Notes (Signed)
[facility]  [phone] [phone]     ID: [name] DOB: July 09, 1945  MR#: [id number]  JXW#:[id number]  Patient Care Team: Brianna Dills, MD as PCP - General (Internal Medicine) Brianna Crafts, MD as PCP - Cardiology (Cardiology) [name], Valentino Hue, MD as Consulting Physician (Oncology) Brianna Kelp, MD as Consulting Physician (General Surgery) Brianna Pickler, MD (Radiology) Brianna Hail, MD as Consulting Physician (Rheumatology) Brianna Neighbor, MD (Urology) Brianna Roberts, MD as Consulting Physician (Dermatology) Brianna Berger, MD as Consulting Physician (Ophthalmology) Brianna Antonio, MD as Consulting Physician (Physical Medicine and Rehabilitation) Brianna Bears, MD as Consulting Physician (Gynecology) Brianna Cairo, MD (Dermatology) OTHER MD:   CHIEF COMPLAINT: Estrogen receptor positive breast cancer  CURRENT TREATMENT:  Anastrozole; PALLAS trial (observation)   INTERVAL HISTORY: [name] returns today for follow-up of her estrogen receptor positive breast cancer.    She continues on anastrozole.  She tolerates this with no significant side effects  [name]'s last bone density screening on 01/30/2017 showed a T-score of -2.7, which is considered osteoporotic.    REVIEW OF SYSTEMS: [name] is feeling pretty overwhelmed and although [name] is doing a little bit better she says the last year has been the most stressful of her life.  She pretty much is beat by the time the evening comes and the hour or 2 she has after supper, which is the only time she has for herself during the day, she spends in bed reading.  She is not exercising partly because of the stress and partly because she does have significant sciatica.  She tells me [name] is being followed here by [name] but also by [name] at the gerontology Center at [facility] and she had many good things to say about that program.   COVID 19 VACCINATION  STATUS: Status post Pfizer x2 followed by the booster August 2021   BREAST CANCER HISTORY: From the original intake note:  [name] had routine mammographic screening in 08/18/2015 at the [facility], showing a right breast mass and possible axillary adenopathy. On 08/23/2015 she underwent bilateral diagnostic mammography with tomography and bilateral breast ultrasonography. The breast density was category B in the right breast there was a spiculated mass in the upper-outer quadrant measuring 1.2 cm and associated with pleomorphic calcifications. There were calcifications extending anteriorly and posteriorly from the mass worrisome for ductal carcinoma in situ. The total area in question is about 3 cm. There were also multiple enlarged right axillary lymph nodes. The mass was palpated as an area of fullness at the 12:00 position of the breast 3 cm from the nipple. There were palpable enlarged right axillary lymph nodes in the right axilla. Ultrasonography of the right breast confirmed an irregular hypoechoic mass in the area in question measuring 1.6 cm. There were multiple abnormal appearing enlarged right axillary lymph nodes, the largest measuring 2.3 cm.  In the left breast, mammography showed an area of architectural distortion associated with the left nipple. This was not palpable. Ultrasonography of the left breast found no correlate. This area of the left breast was biopsied under tomography 08/24/2015. This showed (SAA [id number]) a complex sclerosing lesion and fibroadenoma.  On 08/23/2015 the patient underwent biopsy of the right breast mass in 1 of the abnormal right axillary lymph nodes. 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 Both  biopsies were HER-2 nonamplified, the breast being ratio 1.27 and number per cell 1.90, and the lymph node  1 ratio 1.24 and number per cell 3.0.  On 08/26/2015 the patient underwent bilateral breast MRI. In the left breast there was no suspicious enhancement. The left axilla was benign. In the right breast there was a mass described as in the upper inner quadrant measuring 2.3 cm. Less than a centimeter away from this mass there was a satellite nodule measuring 0.9 cm. In the right axilla there were multiple enlarged right axillary lymph nodes, including a confluent region measuring up to 4 cm. There were also enlarged level II lymph nodes present measuring up to 2.1 cm.  The patient's subsequent history is as detailed below   PAST MEDICAL HISTORY: Past Medical History:  Diagnosis Date  . Anxiety   . Arthritis 2008  . Asthma 1953   age 52  . Breast cancer ([place]) dx'd 2017   right  . Cancer [facility])    breast cancer  . Chronic vaginitis 1998  . Fibromyalgia 1982  . GERD (gastroesophageal reflux disease)    since at least 2011  . Hemorrhoids 2009  . History of radiation therapy 01/18/16-03/05/16   right breast/nodal areas 45 Gy, right breast boost 16 Gy  . Hypoglycemia 2003  . Hypothyroidism 1964   age 64  . Interstitial cystitis 1998  . Irritable bowel syndrome 06/14/2009   Qualifier: Diagnosis of  By: Brianna Baltimore RN, Langley Gauss    . Menopausal symptoms   . Osteoporosis 12/24/2006  . Personal history of chemotherapy   . Personal history of radiation therapy   . Pneumonia 1979   varicella  . PONV (postoperative nausea and vomiting) 1979    PAST SURGICAL HISTORY: Past Surgical History:  Procedure Laterality Date  . BLADDER HYPEREXTENSION     X4, prior to 01/2006  . BLADDER SUSPENSION    . BREAST BIOPSY    . BREAST LUMPECTOMY Right    2017  . BREAST LUMPECTOMY WITH NEEDLE LOCALIZATION AND AXILLARY LYMPH NODE DISSECTION Right 12/05/2015   Procedure: RIGHT BREAST NEEDLE LOCALIZED (WIRES X 2) LUMPECTOMY WITH AXILLARY LYMPH NODE DISSECTION;  Surgeon: Brianna Skates, MD;  Location: [place];   Service: General;  Laterality: Right;  . BREAST LUMPECTOMY WITH RADIOACTIVE SEED LOCALIZATION Left 12/05/2015   Procedure: RADIOACTIVE SEED GUIDED LEFT BREAST LUMPECTOMY;  Surgeon: Brianna Skates, MD;  Location: [place];  Service: General;  Laterality: Left;  . BREAST SURGERY    . BUNIONECTOMY     prior to 01/2006  . CARDIAC CATHETERIZATION    . CESAREAN SECTION  1979  . CHOLECYSTECTOMY  02/08/2006   DR. INGRAM  . COLONOSCOPY    . CORONARY STENT INTERVENTION N/A 01/01/2017   Procedure: CORONARY STENT INTERVENTION;  Surgeon: Jettie Booze, MD;  Location: [place] [facility];  Service: Cardiovascular;  Laterality: N/A;  . DENTAL SURGERY  2009-2016   13 dental implants  . LEFT HEART CATH AND CORONARY ANGIOGRAPHY N/A 01/01/2017   Procedure: LEFT HEART CATH AND CORONARY ANGIOGRAPHY;  Surgeon: Jettie Booze, MD;  Location: [place] [facility];  Service: Cardiovascular;  Laterality: N/A;  . LEG SURGERY  07/06/2004   Broken tibia  . NASAL SINUS SURGERY     X4; prior to 01/2006  . PORT-A-CATH REMOVAL Right 12/05/2015   Procedure: REMOVAL PORT-A-CATH;  Surgeon: Brianna Skates, MD;  Location: [place];  Service: General;  Laterality: Right;  . PORTACATH PLACEMENT Left 09/08/2015   Procedure: INSERTION OF  PORT-A-CATH ;  Surgeon: Brianna Skates, MD;  Location: [facility] OR;  Service: General;  Laterality: Left;    FAMILY HISTORY Family History  Problem Relation Age of Onset  . Hypertension Mother   . Heart disease Mother        d. 16  . Parkinsonism Father        d. 77  . Heart disease Sister   . Breast cancer Sister 93       s/p mastectomy; reportedly negative BRCA1/2 testing approx 7 years ago  . Melanoma Sister        d. 37s; hx of incresed sun exposure  . Cancer Sister        dx. sarcoma of her face in her 29s; treated w/ MOHS surgery  . Parkinsonism Paternal Aunt        d. 90s  . Parkinsonism Paternal Uncle        d. late 44s  . Kidney failure Maternal Grandmother 27  . Heart attack  Maternal Grandfather        d. 24s  . Heart disease Paternal Grandmother        d. 61  . Breast cancer Other 61       maternal great aunt (MGF's sister)  . Kidney cancer Paternal Uncle 75       d. 61s; unsure about smoking status  The patient's father died at age 67 from complications of Parkinson's disease. The patient's mother died age 2 with heart disease. Dequita had no brothers. She had 2 sisters. One sister was diagnosed with breast cancer at age 73. She was tested for the BRCA gene and was negative. The same sister also had melanoma diagnosed in her 2s. The patient has one maternal great aunt diagnosed with breast cancer around age 27. There is no history of ovarian cancer in the family   GYNECOLOGIC HISTORY:  Patient's last menstrual period was 04/30/1996 (approximate). Menarche age 34, first live birth age 25. The patient is GX P2. She stopped having periods in her early 16s and took hormone replacement until her breast cancer diagnosis, April 2017.   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 He has been in remission for more than 10 years. Their daughter Nira Conn lives in [place] were she is Garment/textile technologist of the [employer]. Daughter Belenda Cruise is an infectious disease M.D. working and teaching at [employer] in [place]. The patient has 2 grandchildren. The family attends [employer]: Not in place   HEALTH MAINTENANCE: Social History   Tobacco Use  . Smoking status: Former Smoker    Packs/day: 0.25    Years: 15.00    Pack years: 3.75    Types: Cigarettes  . Smokeless tobacco: Never Used  . Tobacco comment: social smoker/"on and off"  Vaping Use  . Vaping Use: Never used  Substance Use Topics  . Alcohol use: Yes    Alcohol/week: 0.0  standard drinks    Comment: social  . Drug use: No     Colonoscopy: 2016/ [facility]  PAP: 11/2018, negative  Bone density: October 2018, T-score: -2.7  Lipid panel:  Allergies  Allergen Reactions  . Chlorhexidine Other (See Comments)    [place] IS OK. The CLOTH WIPE causes reaction. Causes itching all over the area  .  Other Rash    Adhesive tape    Current Outpatient Medications  Medication Sig Dispense Refill  . ondansetron (ZOFRAN) 4 MG tablet Take 1-2 tablets (4-8 mg total) by mouth every 8 (eight) hours as needed for nausea or vomiting. 60 tablet 4  . albuterol (PROVENTIL HFA;VENTOLIN HFA) 108 (90 Base) MCG/ACT inhaler Inhale 1-2 puffs into the lungs every 6 (six) hours as needed for wheezing or shortness of breath. Started in 1998    . anastrozole (ARIMIDEX) 1 MG tablet Take 1 tablet (1 mg total) by mouth daily. 90 tablet 4  . BRILINTA 90 MG TABS tablet TAKE 1 TABLET(90 MG) BY MOUTH TWICE DAILY 60 tablet 5  . buPROPion (WELLBUTRIN XL) 300 MG 24 hr tablet Take 300 mg by mouth daily.     . Calcium Carbonate-Vitamin D (CALCIUM + D PO) Take 1 tablet by mouth at bedtime. Started approximately 1998    . carisoprodol (SOMA) 350 MG tablet Take 350 mg by mouth 2 (two) times daily as needed for muscle spasms. Started in 2008.  2  . cephALEXin (KEFLEX) 500 MG capsule Take 1 capsule (500 mg total) by mouth 2 (two) times daily. 14 capsule 0  . diazepam (VALIUM) 5 MG tablet Take 5 mg by mouth every 6 (six) hours as needed for muscle spasms (interstitial cystitis). Started approximately 2008.    . furosemide (LASIX) 20 MG tablet TAKE 1 TABLET(20 MG) BY MOUTH DAILY AS NEEDED FOR SHORTNESS OF BREATH 30 tablet 10  . gabapentin (NEURONTIN) 300 MG capsule TAKE 3 TO 4 CAPSULES(900 TO 1200 MG) BY MOUTH AT BEDTIME 120 capsule 4  . HYDROcodone-acetaminophen (NORCO) 10-325 MG tablet Take 1 tablet by mouth every 4 (four) hours as needed for moderate pain or severe pain (as needed for fibromyalgia and IC).     .  hydrOXYzine (ATARAX/VISTARIL) 25 MG tablet Take 1 tablet by mouth at bedtime.    Marland Kitchen levothyroxine (SYNTHROID, LEVOTHROID) 75 MCG tablet Take 75 mcg by mouth daily.  2  . LORazepam (ATIVAN) 2 MG tablet Take 1 tablet by mouth daily as needed for anxiety.   5  . nitroGLYCERIN (NITROSTAT) 0.4 MG SL tablet PLACE 1 TABLET UNDER TONGUE EVERY FIVE MINUTES AS NEEDED FOR CHEST PAIN 25 tablet 3  . NONFORMULARY OR COMPOUNDED ITEM Vit E vaginal suppositories 200mg /ml.  One pv three times weekly.  #36/4RF 36 each 4  . ondansetron (ZOFRAN) 8 MG tablet TAKE 1 TABLET EVERY 8 HOURS AS NEEDED FOR NAUSEA AND VOMITING. 20 tablet 1  . pantoprazole (PROTONIX) 40 MG tablet Take 40 mg by mouth daily. Started in 2012.    . potassium chloride (KLOR-CON) 10 MEQ tablet TAKE 1 TABLET BY MOUTH AS NEEDED. TAKE WITH LASIX 30 tablet 10  . predniSONE (DELTASONE) 10 MG tablet Take 2 tablets (20 mg total) by mouth daily with breakfast. 60 tablet 0  . prochlorperazine (COMPAZINE) 10 MG tablet Take 1 tablet (10 mg total) by mouth every 6 (six) hours as needed for nausea or vomiting. 385 tablet 1  . rosuvastatin (CRESTOR) 40 MG tablet Take 1 tablet (40 mg total) by mouth daily. TAKE 1 TABLET(40 MG) BY MOUTH DAILY 90 tablet 3  . terconazole (TERAZOL 7) 0.4 % vaginal cream Place 1 applicator vaginally at bedtime.    . Vaginal Lubricant (REPLENS VA) Place vaginally daily.    . Vitamin D, Ergocalciferol, (DRISDOL) 1.25 MG (50000 UNIT) CAPS capsule TAKE 1 CAPSULE BY MOUTH EVERY 7 DAYS 12 capsule 0  . zolpidem (  AMBIEN) 10 MG tablet Take 10 mg by mouth at bedtime as needed for sleep.     No current facility-administered medications for this visit.    OBJECTIVE: white woman who appears younger than stated age  28:   07/07/20 1406  BP: (!) 158/82  Pulse: 73  Resp: 18  Temp: (!) 97.5 F (36.4 C)  SpO2: 96%     Body mass index is 28.27 kg/m.    ECOG FS:1 - Symptomatic but completely ambulatory  Sclerae unicteric, EOMs  intact Wearing a mask No cervical or supraclavicular adenopathy Lungs no rales or rhonchi Heart regular rate and rhythm Abd soft, nontender, positive bowel sounds MSK no focal spinal tenderness, no upper extremity lymphedema Neuro: nonfocal, well oriented, appropriate affect Breasts: The right breast is status post lumpectomy and radiation.  The coarseness and brownish nonpalpable lesions previously present are now much softer and pinker as well as smaller.  The left breast and both axillae are benign  LAB RESULTS:  CMP     Component Value Date/Time   NA 138 07/07/2020 1332   NA 138 03/11/2017 1524   K 4.4 07/07/2020 1332   K 3.8 03/11/2017 1524   CL 105 07/07/2020 1332   CO2 27 07/07/2020 1332   CO2 25 03/11/2017 1524   GLUCOSE 100 (H) 07/07/2020 1332   GLUCOSE 136 03/11/2017 1524   BUN 13 07/07/2020 1332   BUN 13.2 03/11/2017 1524   CREATININE 0.92 07/07/2020 1332   CREATININE 0.83 02/24/2019 1224   CREATININE 0.8 03/11/2017 1524   CALCIUM 9.0 07/07/2020 1332   CALCIUM 9.6 03/11/2017 1524   PROT 7.1 07/07/2020 1332   PROT 6.6 04/11/2020 1109   PROT 7.6 03/11/2017 1524   ALBUMIN 3.8 07/07/2020 1332   ALBUMIN 4.1 04/11/2020 1109   ALBUMIN 3.8 03/11/2017 1524   AST 19 07/07/2020 1332   AST 19 02/24/2019 1224   AST 24 03/11/2017 1524   ALT 18 07/07/2020 1332   ALT 15 02/24/2019 1224   ALT 38 03/11/2017 1524   ALKPHOS 82 07/07/2020 1332   ALKPHOS 166 (H) 03/11/2017 1524   BILITOT 0.5 07/07/2020 1332   BILITOT 0.6 04/11/2020 1109   BILITOT 0.5 02/24/2019 1224   BILITOT 0.44 03/11/2017 1524   GFRNONAA >60 07/07/2020 1332   GFRNONAA >60 02/24/2019 1224   GFRAA >60 01/15/2020 1457   GFRAA >60 02/24/2019 1224    INo results found for: SPEP, UPEP  Lab Results  Component Value Date   WBC 7.5 07/07/2020   NEUTROABS 5.4 07/07/2020   HGB 13.8 07/07/2020   HCT 41.6 07/07/2020   MCV 96.3 07/07/2020   PLT 241 07/07/2020      Chemistry      Component Value  Date/Time   NA 138 07/07/2020 1332   NA 138 03/11/2017 1524   K 4.4 07/07/2020 1332   K 3.8 03/11/2017 1524   CL 105 07/07/2020 1332   CO2 27 07/07/2020 1332   CO2 25 03/11/2017 1524   BUN 13 07/07/2020 1332   BUN 13.2 03/11/2017 1524   CREATININE 0.92 07/07/2020 1332   CREATININE 0.83 02/24/2019 1224   CREATININE 0.8 03/11/2017 1524      Component Value Date/Time   CALCIUM 9.0 07/07/2020 1332   CALCIUM 9.6 03/11/2017 1524   ALKPHOS 82 07/07/2020 1332   ALKPHOS 166 (H) 03/11/2017 1524   AST 19 07/07/2020 1332   AST 19 02/24/2019 1224   AST 24 03/11/2017 1524   ALT 18 07/07/2020 1332  ALT 15 02/24/2019 1224   ALT 38 03/11/2017 1524   BILITOT 0.5 07/07/2020 1332   BILITOT 0.6 04/11/2020 1109   BILITOT 0.5 02/24/2019 1224   BILITOT 0.44 03/11/2017 1524       No results found for: LABCA2  No components found for: LABCA125  No results for input(s): INR in the last 168 hours.  Urinalysis    Component Value Date/Time   COLORURINE YELLOW 01/03/2017 1205   APPEARANCEUR HAZY (A) 01/03/2017 1205   LABSPEC 1.006 01/03/2017 1205   PHURINE 7.0 01/03/2017 1205   GLUCOSEU NEGATIVE 01/03/2017 1205   HGBUR SMALL (A) 01/03/2017 1205   BILIRUBINUR NEGATIVE 01/03/2017 1205   BILIRUBINUR n 10/01/2016 1316   KETONESUR NEGATIVE 01/03/2017 1205   PROTEINUR NEGATIVE 01/03/2017 1205   UROBILINOGEN 0.2 10/01/2016 1316   NITRITE NEGATIVE 01/03/2017 1205   LEUKOCYTESUR LARGE (A) 01/03/2017 1205    ELIGIBLE FOR AVAILABLE RESEARCH PROTOCOL: PALLAS   STUDIES: No results found.   ASSESSMENT: 75 y.o. [place] woman status post right breast upper outer quadrant and right axillary lymph node biopsy 08/23/2015, both positive for a clinical T1 N2, stage IIIA invasive ductal carcinoma, grade 2, strongly estrogen receptor positive, 5% progesterone receptor positive, with an MIB-120-30% and no HER-2 amplification  (a) biopsy of a 0.9 cm Right breast satellite nodule 09/13/2015 showed  invasive ductal carcinoma, grade 2, E-cadherin strongly positive, with a prognostic panel identical to the larger right upper quadrant mass  (1) left breast biopsy 08/24/2015 shows a complex sclerosing lesion, excised 12/05/2015  (2) neoadjuvant chemotherapy consisting of cyclophosphamide and docetaxel every 21 days 4, with OnPro support, started 09/12/2015, completed 11/14/2015  (3) status post right lumpectomy and sentinel lymph node dissection 12/05/2015 for a residual pT2 pN3, stage IIIC invasive ductal carcinoma, grade 2, with a close anterior margin (skin)  (4) adjuvant radiation 01/18/16 - 11/6/17with capecitabine chemo-sensitization   1) Right breast/nodal areas: 45 Gy in 25 fractions                         2) Right breast boost: 16 Gy in 8 fractions  (5) capecitabine started 04/13/2016, discontinued January 2018 with mild/moderate side effects developing  (6) anastrozole started 06/01/2016  (a) bone density January 30, 2017 shows a T score of -2.7 (osteoporosis)  (7) genetics testing ([place]kenazi descent) 02/07/2016 through the Breast/Ovarian Cancer Panel offered by [facility] Brianna Cairo, MD) i found no deleterious mutations in  ATM, BARD1, BRCA1, BRCA2, BRIP1, CDH1, CHEK2, FANCC, MLH1, MSH2, MSH6, NBN, PALB2, PMS2, PTEN, RAD51C, RAD51D, TP53, and XRCC2.  This panel also includes deletion/duplication analysis (without sequencing) for one gene, EPCAM.  (8) consented to PALLAS trial   (a) randomized to anastrozole alone   PLAN: Brooks is now just about 5 years out from initial diagnosis of breast cancer with no evidence of disease activity.  This is very favorable.  She continues on the [place] trial, currently of the 48th month and I will see her again in September as part of that protocol.  Given though she is under a great deal of stress at present I emphasized the importance of exercise.  I should mention that she has no symptoms or side effects related to her prior  medications for cancer or her participation in the study.  She is not on any anticancer medications aside from anastrozole which will be continued through February 2023  Total encounter time 25 minutes.   [name] Lemen, Valentino Hue, MD  07/07/20 4:29  PM Medical Oncology and Hematology [facility] [place], [place] [address] Tel. [phone]    Fax. [phone]   I, Wilburn Mylar, am acting as scribe for Dr. Virgie Dad. Samariya Rockhold.  I, Lurline Del MD, have reviewed the above documentation for accuracy and completeness, and I agree with the above.   *Total Encounter Time as defined by the Centers for Medicare and Medicaid Services includes, in addition to the face-to-face time of a patient visit (documented in the note above) non-face-to-face time: obtaining and reviewing outside history, ordering and reviewing medications, tests or procedures, care coordination (communications with other health care professionals or caregivers) and documentation in the medical record.

## 2020-07-07 ENCOUNTER — Telehealth: Payer: Self-pay | Admitting: Oncology

## 2020-07-07 ENCOUNTER — Other Ambulatory Visit: Payer: Self-pay

## 2020-07-07 ENCOUNTER — Inpatient Hospital Stay: Payer: Medicare Other | Attending: Oncology | Admitting: Oncology

## 2020-07-07 ENCOUNTER — Inpatient Hospital Stay: Payer: Medicare Other

## 2020-07-07 ENCOUNTER — Encounter: Payer: Self-pay | Admitting: *Deleted

## 2020-07-07 VITALS — BP 158/82 | HR 73 | Temp 97.5°F | Resp 18 | Ht 61.75 in | Wt 153.3 lb

## 2020-07-07 DIAGNOSIS — C50411 Malignant neoplasm of upper-outer quadrant of right female breast: Secondary | ICD-10-CM | POA: Diagnosis present

## 2020-07-07 DIAGNOSIS — Z87891 Personal history of nicotine dependence: Secondary | ICD-10-CM | POA: Insufficient documentation

## 2020-07-07 DIAGNOSIS — C773 Secondary and unspecified malignant neoplasm of axilla and upper limb lymph nodes: Secondary | ICD-10-CM | POA: Insufficient documentation

## 2020-07-07 DIAGNOSIS — M810 Age-related osteoporosis without current pathological fracture: Secondary | ICD-10-CM | POA: Insufficient documentation

## 2020-07-07 DIAGNOSIS — Z170 Estrogen receptor positive status [ER+]: Secondary | ICD-10-CM

## 2020-07-07 LAB — COMPREHENSIVE METABOLIC PANEL
ALT: 18 U/L (ref 0–44)
AST: 19 U/L (ref 15–41)
Albumin: 3.8 g/dL (ref 3.5–5.0)
Alkaline Phosphatase: 82 U/L (ref 38–126)
Anion gap: 6 (ref 5–15)
BUN: 13 mg/dL (ref 8–23)
CO2: 27 mmol/L (ref 22–32)
Calcium: 9 mg/dL (ref 8.9–10.3)
Chloride: 105 mmol/L (ref 98–111)
Creatinine, Ser: 0.92 mg/dL (ref 0.44–1.00)
GFR, Estimated: >60 mL/min (ref >60)
Glucose, Bld: 100 mg/dL — ABNORMAL HIGH (ref 70–99)
Potassium: 4.4 mmol/L (ref 3.5–5.1)
Sodium: 138 mmol/L (ref 135–145)
Total Bilirubin: 0.5 mg/dL (ref 0.3–1.2)
Total Protein: 7.1 g/dL (ref 6.5–8.1)

## 2020-07-07 LAB — CBC WITH DIFFERENTIAL/PLATELET
Abs Immature Granulocytes: 0.02 10*3/uL (ref 0.00–0.07)
Basophils Absolute: 0 10*3/uL (ref 0.0–0.1)
Basophils Relative: 0 %
Eosinophils Absolute: 0.1 10*3/uL (ref 0.0–0.5)
Eosinophils Relative: 2 %
HCT: 41.6 % (ref 36.0–46.0)
Hemoglobin: 13.8 g/dL (ref 12.0–15.0)
Immature Granulocytes: 0 %
Lymphocytes Relative: 18 %
Lymphs Abs: 1.3 10*3/uL (ref 0.7–4.0)
MCH: 31.9 pg (ref 26.0–34.0)
MCHC: 33.2 g/dL (ref 30.0–36.0)
MCV: 96.3 fL (ref 80.0–100.0)
Monocytes Absolute: 0.6 10*3/uL (ref 0.1–1.0)
Monocytes Relative: 8 %
Neutro Abs: 5.4 10*3/uL (ref 1.7–7.7)
Neutrophils Relative %: 72 %
Platelets: 241 10*3/uL (ref 150–400)
RBC: 4.32 MIL/uL (ref 3.87–5.11)
RDW: 13.7 % (ref 11.5–15.5)
WBC: 7.5 10*3/uL (ref 4.0–10.5)
nRBC: 0 % (ref 0.0–0.2)

## 2020-07-07 MED ORDER — ONDANSETRON HCL 4 MG PO TABS: 4.0000 mg | ORAL_TABLET | Freq: Three times a day (TID) | ORAL | 4 refills | Status: DC | PRN

## 2020-07-07 NOTE — Telephone Encounter (Signed)
Scheduled appts per 3/10 los. Gave pt a print out of AVS.  

## 2020-07-07 NOTE — Research (Signed)
07/07/2020 at 2:27pm - [place] [facility] visit  AFT-05 PALLAS -Month 48 follow-up visit Patient into clinic today unaccompanied for visit with Dr. Jana Hakim.  Research nurse met with patient to collect data for this time point.  Anti-cancer medications - Patient states she is continuing to take anastrozole without interruption; she is on no new anti-cancer medications.  Serious adverse events - Patient denies any serious health problems since her last visit. However, she did report a "pinched nerve" that has caused her to have ongoing sciatica pain.   Disease monitoring - See MD note.  Md stated the pt has "no evidence of disease activity".   COVID-19 - The pt is status post Pfizer vaccination x 2 followed by the booster in August 2021.  Patient updated the research nurse today that she received a 2nd COVID-19 booster vaccine on 06/12/20 because she is considered high risk.    The patient is aware that her next research visit- month 40 is due in September 2022.  She is scheduled to see Dr. Jana Hakim for a clinic visit on 01/11/21. The research staff will see the pt at this visit.  The patient was thanked for her participation in this research study.  Brion Aliment RN, BSN, CCRP Clinical Research Nurse Lead 07/07/2020 2:29 PM

## 2020-07-26 DIAGNOSIS — Z636 Dependent relative needing care at home: Secondary | ICD-10-CM | POA: Insufficient documentation

## 2020-07-29 ENCOUNTER — Encounter: Payer: Self-pay | Admitting: Emergency Medicine

## 2020-07-29 ENCOUNTER — Telehealth: Payer: Medicare Other | Admitting: Emergency Medicine

## 2020-07-29 DIAGNOSIS — R399 Unspecified symptoms and signs involving the genitourinary system: Secondary | ICD-10-CM

## 2020-07-29 MED ORDER — CEPHALEXIN 500 MG PO CAPS: 500.0000 mg | ORAL_CAPSULE | Freq: Two times a day (BID) | ORAL | 0 refills | Status: AC

## 2020-07-29 NOTE — Progress Notes (Signed)
[name], [name] are scheduled for a virtual visit with your provider today.    Just as we do with appointments in the office, we must obtain your consent to participate.  Your consent will be active for this visit and any virtual visit you may have with one of our providers in the next 365 days.    If you have a MyChart account, I can also send a copy of this consent to you electronically.  All virtual visits are billed to your insurance company just like a traditional visit in the office.  As this is a virtual visit, video technology does not allow for your provider to perform a traditional examination.  This may limit your provider's ability to fully assess your condition.  If your provider identifies any concerns that need to be evaluated in person or the need to arrange testing such as labs, EKG, etc, we will make arrangements to do so.    Although advances in technology are sophisticated, we cannot ensure that it will always work on either your end or our end.  If the connection with a video visit is poor, we may have to switch to a telephone visit.  With either a video or telephone visit, we are not always able to ensure that we have a secure connection.   I need to obtain your verbal consent now.   Are you willing to proceed with your visit today?   [name] has provided verbal consent on 07/29/2020 for a virtual visit (video or telephone).   Brianna Gens, PA-C 07/29/2020  5:31 PM   Date:  07/29/2020   ID:  [name], DOB 1945-09-16, MRN [id number]  Patient Location: Home Provider Location: Home Office   Participants: Patient and Provider for Visit and Wrap up  Method of visit: Video  Location of Patient: Home Location of Provider: Home Office Consent was obtain for visit over the video. Services rendered by provider: Visit was performed via video  A video enabled telemedicine application was used and I verified that I am speaking with the correct person using two  identifiers. Pt states her daughter helped in setting up the video visit.   PCP:  Seward Carol, MD   Chief Complaint: dysuria  History of Present Illness:    [name] is a 75 y.o. female with history as stated below including history of interstitial cystitis and UTIs.  Presents video telehealth for an acute care visit   Onset of symptoms was 3 days ago and symptoms have been persistent and include: dysuria, urinary frequency, bladder spasms, mild nausea without vomiting and fatigue.   She called her urologist with [facility] and was advised to drop off a urine sample this afternoon, which she did around 2PM but has not received a call back about the results. Pt does not want symptoms to worsen over the weekend while waiting on a urine culture.  She has done well with cephalexin in the past.   Denies having fevers, shortness of breath, cough, chest pain, ear pain, sore throat or exposure to covid or other sick contacts.  Denies vomiting or diarrhea. Denies headache, dizziness or lightheadedness.   Modifying factors include: [place] and cranberries without relief.  No other aggravating or relieving factors.  No other c/o.   The patient does not have symptoms concerning for COVID-19 infection (fever, chills, cough, or new shortness of breath).  Patient not been tested for COVID during this illness.  Past Medical,  Surgical, Social History, Allergies, and Medications have been Reviewed.  Patient Active Problem List   Diagnosis Date Noted  . Lumbar radiculopathy 05/11/2019  . Bilateral stenosis of lateral recess of lumbar spine 05/11/2019  . Hyperlipidemia 12/23/2018  . Chronic diastolic CHF (congestive heart failure) ([place]) 11/24/2018  . Abnormal transaminases 03/24/2018  . CAD (coronary artery disease) 02/11/2017  . 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 Status post coronary artery stent placement   . Non-ST elevation (NSTEMI) myocardial infarction ([place])   .  Depression with anxiety 12/31/2016  . Chest pain 12/31/2016  . Hyponatremia 12/31/2016  . Chronic pain 12/31/2016  . Essential hypertension 12/31/2016  . Unstable angina ([place])   . Family history of breast cancer in female 02/08/2016  . Rash and nonspecific skin eruption 10/03/2015  . 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 Malignant neoplasm of upper-outer quadrant of right breast in female, estrogen receptor positive ([place]) 09/01/2015  . 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 Asthma 09/12/2011  . Menopausal symptoms   . Chronic vaginitis   . Fibromyalgia   . Interstitial cystitis   . UNSPECIFIED HYPOTHYROIDISM 06/14/2009  . DEPRESSIVE DISORDER NOT ELSEWHERE CLASSIFIED 06/14/2009  . Hereditary and idiopathic peripheral neuropathy 06/14/2009  . UNSPEC HEMORRHOIDS WITHOUT MENTION COMPLICATION 09/81/1914  . ALLERGIC RHINITIS CAUSE UNSPECIFIED 06/14/2009  . ESOPHAGEAL REFLUX 06/14/2009  . Irritable bowel syndrome 06/14/2009  . INSOMNIA UNSPECIFIED 06/14/2009  . Osteoporosis 08/27/2007    Social History   Tobacco Use  . Smoking status: Former Smoker    Packs/day: 0.25    Years: 15.00    Pack years: 3.75    Types: Cigarettes  . Smokeless tobacco: Never Used  . Tobacco comment: social smoker/"on and off"  Substance Use Topics  . Alcohol use: Yes    Alcohol/week: 0.0 standard drinks    Comment: social     Current Outpatient Medications:  .  cephALEXin (KEFLEX) 500 MG capsule, Take 1 capsule (500 mg total) by mouth 2 (two) times daily for 7 days., Disp: 14 capsule, Rfl: 0 .  albuterol (PROVENTIL HFA;VENTOLIN HFA) 108 (90 Base) MCG/ACT inhaler, Inhale 1-2 puffs into the lungs every 6 (six) hours as needed for wheezing or shortness of breath. Started in 1998, Disp: , Rfl:  .  anastrozole (ARIMIDEX) 1 MG tablet, Take 1 tablet (1 mg total) by mouth daily., Disp: 90 tablet, Rfl: 4 .  BRILINTA 90 MG TABS tablet, TAKE 1 TABLET(90 MG) BY MOUTH TWICE DAILY,  Disp: 60 tablet, Rfl: 5 .  buPROPion (WELLBUTRIN XL) 300 MG 24 hr tablet, Take 300 mg by mouth daily. , Disp: , Rfl:  .  Calcium Carbonate-Vitamin D (CALCIUM + D PO), Take 1 tablet by mouth at bedtime. Started approximately 1998, Disp: , Rfl:  .  carisoprodol (SOMA) 350 MG tablet, Take 350 mg by mouth 2 (two) times daily as needed for muscle spasms. Started in 2008., Disp: , Rfl: 2 .  diazepam (VALIUM) 5 MG tablet, Take 5 mg by mouth every 6 (six) hours as needed for muscle spasms (interstitial cystitis). Started approximately 2008., Disp: , Rfl:  .  furosemide (LASIX) 20 MG tablet, TAKE 1 TABLET(20 MG) BY MOUTH DAILY AS NEEDED FOR SHORTNESS OF BREATH, Disp: 30 tablet, Rfl: 10 .  gabapentin (NEURONTIN) 300 MG capsule, TAKE 3 TO 4 CAPSULES(900 TO 1200 MG) BY MOUTH AT BEDTIME, Disp: 120 capsule, Rfl: 4 .  HYDROcodone-acetaminophen (NORCO) 10-325 MG tablet, Take 1 tablet by mouth every 4 (four)  hours as needed for moderate pain or severe pain (as needed for fibromyalgia and IC). , Disp: , Rfl:  .  hydrOXYzine (ATARAX/VISTARIL) 25 MG tablet, Take 1 tablet by mouth at bedtime., Disp: , Rfl:  .  levothyroxine (SYNTHROID, LEVOTHROID) 75 MCG tablet, Take 75 mcg by mouth daily., Disp: , Rfl: 2 .  LORazepam (ATIVAN) 2 MG tablet, Take 1 tablet by mouth daily as needed for anxiety. , Disp: , Rfl: 5 .  nitroGLYCERIN (NITROSTAT) 0.4 MG SL tablet, PLACE 1 TABLET UNDER TONGUE EVERY FIVE MINUTES AS NEEDED FOR CHEST PAIN, Disp: 25 tablet, Rfl: 3 .  NONFORMULARY OR COMPOUNDED ITEM, Vit E vaginal suppositories 200mg /ml.  One pv three times weekly.  #36/4RF, Disp: 36 each, Rfl: 4 .  ondansetron (ZOFRAN) 4 MG tablet, Take 1-2 tablets (4-8 mg total) by mouth every 8 (eight) hours as needed for nausea or vomiting., Disp: 60 tablet, Rfl: 4 .  ondansetron (ZOFRAN) 8 MG tablet, TAKE 1 TABLET EVERY 8 HOURS AS NEEDED FOR NAUSEA AND VOMITING., Disp: 20 tablet, Rfl: 1 .  pantoprazole (PROTONIX) 40 MG tablet, Take 40 mg by mouth  daily. Started in 2012., Disp: , Rfl:  .  potassium chloride (KLOR-CON) 10 MEQ tablet, TAKE 1 TABLET BY MOUTH AS NEEDED. TAKE WITH LASIX, Disp: 30 tablet, Rfl: 10 .  predniSONE (DELTASONE) 10 MG tablet, Take 2 tablets (20 mg total) by mouth daily with breakfast., Disp: 60 tablet, Rfl: 0 .  prochlorperazine (COMPAZINE) 10 MG tablet, Take 1 tablet (10 mg total) by mouth every 6 (six) hours as needed for nausea or vomiting., Disp: 385 tablet, Rfl: 1 .  rosuvastatin (CRESTOR) 40 MG tablet, Take 1 tablet (40 mg total) by mouth daily. TAKE 1 TABLET(40 MG) BY MOUTH DAILY, Disp: 90 tablet, Rfl: 3 .  terconazole (TERAZOL 7) 0.4 % vaginal cream, Place 1 applicator vaginally at bedtime., Disp: , Rfl:  .  Vaginal Lubricant (REPLENS VA), Place vaginally daily., Disp: , Rfl:  .  Vitamin D, Ergocalciferol, (DRISDOL) 1.25 MG (50000 UNIT) CAPS capsule, TAKE 1 CAPSULE BY MOUTH EVERY 7 DAYS, Disp: 12 capsule, Rfl: 0 .  zolpidem (AMBIEN) 10 MG tablet, Take 10 mg by mouth at bedtime as needed for sleep., Disp: , Rfl:    Allergies  Allergen Reactions  . Chlorhexidine Other (See Comments)    [place] IS OK. The CLOTH WIPE causes reaction. Causes itching all over the area  . Other Rash    Adhesive tape     Review of Systems  Constitutional: Positive for chills and malaise/fatigue (mild). Negative for fever.  Genitourinary: Positive for dysuria, frequency and urgency. Negative for flank pain and hematuria.   See HPI for history of present illness.  Physical Exam Constitutional:      General: She is not in acute distress.    Appearance: Normal appearance. She is normal weight. She is not ill-appearing, toxic-appearing or diaphoretic.  HENT:     Head: Normocephalic.  Pulmonary:     Effort: Pulmonary effort is normal.  Neurological:     General: No focal deficit present.     Mental Status: She is alert and oriented to person, place, and time.  Psychiatric:        Mood and Affect: Mood normal.        Behavior:  Behavior normal.               A&P  1. UTI symptoms  -urine culture already pending with her urologist with [facility]  -  will start empirically on cephalexin 500mg  twice daily for 7 days.  -advised to continue to stay well hydrated  -follow up with urologist Monday 08/01/2020, notify urologist she was started on cephalexin this evening.   -discussed signs/symptoms that warrant in-person  evaluation in urgent care or emergency department this evening or this weekend- severe pain, unable to keep down fluids, dizziness/passing out, other new concerns    Patient voiced understanding and agreement to plan.   Time:   Today, I have spent 15 minutes with the patient with telehealth technology discussing the above problems, reviewing the chart, previous notes, medications and orders.    Tests Ordered: No orders of the defined types were placed in this encounter.   Medication Changes: Meds ordered this encounter  Medications  . cephALEXin (KEFLEX) 500 MG capsule    Sig: Take 1 capsule (500 mg total) by mouth 2 (two) times daily for 7 days.    Dispense:  14 capsule    Refill:  0     Disposition:  Follow up urologist early next week. Discussed signs/symptoms for urgent care or emergency department this evening or this weekend.   Etta Grandchild, PA-C  07/29/2020 5:31 PM

## 2020-08-01 ENCOUNTER — Telehealth: Payer: Self-pay

## 2020-08-01 NOTE — Telephone Encounter (Signed)
Patient called she is requesting a appointment for a epidural injection for her back and leg  pain call back:[phone]

## 2020-08-01 NOTE — Telephone Encounter (Signed)
repeat

## 2020-08-01 NOTE — Telephone Encounter (Signed)
Bilateral L4 TF 1/20. Ok to repeat if helped, same problem/side, and no new injury?

## 2020-08-02 NOTE — Telephone Encounter (Signed)
Scheduled for 4/20. Patient wanted me to mention that you said there was a third spot you might be able to inject also.

## 2020-08-02 NOTE — Telephone Encounter (Signed)
Pt called returning Courtney's call for an appt.   [phone]

## 2020-08-02 NOTE — Telephone Encounter (Signed)
Left message #1

## 2020-08-03 NOTE — Telephone Encounter (Signed)
Ok thanks, maybe uppr lumbar vs facet but hard to explain to her that we do not just inject every "abnormal" looking spot

## 2020-08-17 ENCOUNTER — Other Ambulatory Visit: Payer: Self-pay

## 2020-08-17 ENCOUNTER — Ambulatory Visit (INDEPENDENT_AMBULATORY_CARE_PROVIDER_SITE_OTHER): Payer: Medicare Other | Admitting: Physical Medicine and Rehabilitation

## 2020-08-17 ENCOUNTER — Encounter: Payer: Self-pay | Admitting: Physical Medicine and Rehabilitation

## 2020-08-17 ENCOUNTER — Ambulatory Visit: Payer: Self-pay

## 2020-08-17 VITALS — BP 138/81 | HR 86

## 2020-08-17 DIAGNOSIS — M5416 Radiculopathy, lumbar region: Secondary | ICD-10-CM

## 2020-08-17 DIAGNOSIS — M48062 Spinal stenosis, lumbar region with neurogenic claudication: Secondary | ICD-10-CM | POA: Diagnosis not present

## 2020-08-17 MED ORDER — METHYLPREDNISOLONE ACETATE 80 MG/ML IJ SUSP
80.0000 mg | Freq: Once | INTRAMUSCULAR | Status: AC
  Administered 2020-08-17: 80 mg

## 2020-08-17 NOTE — Progress Notes (Signed)
Pt state lower back pain that travels down both legs. Pt state walking and standing makes the pain worse. Pt state getting up from an sitting position makes the pain worse. Pt state pain meds to help ease the pain. Pt has hx of inj on 05/19/20 pt state helped for few weeks.  Numeric Pain Rating Scale and Functional Assessment Average Pain 7   In the last MONTH (on 0-10 scale) has pain interfered with the following?  1. General activity like being  able to carry out your everyday physical activities such as walking, climbing stairs, carrying groceries, or moving a chair?  Rating(9)   +Driver, -BT, -Dye Allergies.

## 2020-08-17 NOTE — Patient Instructions (Signed)

## 2020-08-17 NOTE — Progress Notes (Signed)
[name] - 75 y.o. female MRN [id number]  Date of birth: 1945-09-30  Office Visit Note: Visit Date: 08/17/2020 PCP: Seward Carol, MD Referred by: Seward Carol, MD  Subjective: Chief Complaint  Patient presents with  . Lower Back - Pain  . Left Leg - Pain  . Right Leg - Pain   HPI:  [name] is a 75 y.o. female who comes in today for planned repeat Bilateral L4-L5 Lumbar epidural steroid injection with fluoroscopic guidance.  The patient has failed conservative care including home exercise, medications, time and activity modification.  This injection will be diagnostic and hopefully therapeutic.  Please see requesting physician notes for further details and justification. Patient received more than 50% pain relief from prior injection.  Her case is complicated by ongoing anticoagulation for prior stenting and underlying long-term fibromyalgia.  Could consider interlaminar approach would have to be off of the anticoagulation.  No other new issues or concerning features.  Referring: Dr. Meridee Score    ROS Otherwise per HPI.  Assessment & Plan: Visit Diagnoses:    ICD-10-CM   1. Lumbar radiculopathy  M54.16 XR C-ARM NO REPORT    Epidural Steroid injection    methylPREDNISolone acetate (DEPO-MEDROL) injection 80 mg  2. Spinal stenosis of lumbar region with neurogenic claudication  M48.062 XR C-ARM NO REPORT    Epidural Steroid injection    methylPREDNISolone acetate (DEPO-MEDROL) injection 80 mg    Plan: No additional findings.   Meds & Orders:  Meds ordered this encounter  Medications  . methylPREDNISolone acetate (DEPO-MEDROL) injection 80 mg    Orders Placed This Encounter  Procedures  . XR C-ARM NO REPORT  . Epidural Steroid injection    Follow-up: Return if symptoms worsen or fail to improve.   Procedures: No procedures performed  Lumbosacral Transforaminal Epidural Steroid Injection - Sub-Pedicular Approach with Fluoroscopic Guidance  Patient:  [name]      Date of Birth: May 26, 1945 MRN: [id number] PCP: Seward Carol, MD      Visit Date: 08/17/2020   Universal Protocol:    Date/Time: 08/17/2020  Consent Given By: the patient  Position: PRONE  Additional Comments: Vital signs were monitored before and after the procedure. Patient was prepped and draped in the usual sterile fashion. The correct patient, procedure, and site was verified.   Injection Procedure Details:   Procedure diagnoses: Lumbar radiculopathy [M54.16]    Meds Administered:  Meds ordered this encounter  Medications  . methylPREDNISolone acetate (DEPO-MEDROL) injection 80 mg    Laterality: Bilateral  Location/Site:  L4-L5  Needle:5.0 in., 22 ga.  Short bevel or Quincke spinal needle  Needle Placement: Transforaminal  Findings:    -Comments: Excellent flow of contrast along the nerve, nerve root and into the epidural space.  Procedure Details: After squaring off the end-plates to get a true AP view, the C-arm was positioned so that an oblique view of the foramen as noted above was visualized. The target area is just inferior to the "nose of the scotty dog" or sub pedicular. The soft tissues overlying this structure were infiltrated with 2-3 ml. of 1% Lidocaine without Epinephrine.  The spinal needle was inserted toward the target using a "trajectory" view along the fluoroscope beam.  Under AP and lateral visualization, the needle was advanced so it did not puncture dura and was located close the 6 O'Clock position of the pedical in AP tracterory. Biplanar projections were used to confirm position. Aspiration was confirmed to be negative for  CSF and/or blood. A 1-2 ml. volume of Isovue-250 was injected and flow of contrast was noted at each level. Radiographs were obtained for documentation purposes.   After attaining the desired flow of contrast documented above, a 0.5 to 1.0 ml test dose of 0.25% Marcaine was injected into each respective  transforaminal space.  The patient was observed for 90 seconds post injection.  After no sensory deficits were reported, and normal lower extremity motor function was noted,   the above injectate was administered so that equal amounts of the injectate were placed at each foramen (level) into the transforaminal epidural space.   Additional Comments:  The patient tolerated the procedure well Dressing: 2 x 2 sterile gauze and Band-Aid    Post-procedure details: Patient was observed during the procedure. Post-procedure instructions were reviewed.  Patient left the clinic in stable condition.      Clinical History: MRI LUMBAR SPINE WITHOUT CONTRAST  TECHNIQUE: Multiplanar, multisequence MR imaging of the lumbar spine was performed. No intravenous contrast was administered.  COMPARISON:  01/29/2014  FINDINGS: Segmentation:  5 lumbar type vertebrae  Alignment: Levoscoliosis. Grade 1 anterolisthesis at L4-5. Mild retrolisthesis at L1-2 and L2-3. Misalignment has progressed from 2015.  Vertebrae:  No fracture, evidence of discitis, or bone lesion.  Conus medullaris and cauda equina: Conus extends to the L1-2 level. Conus and cauda equina appear normal. 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  Disc levels:  T12- L1: Disc narrowing and ventral spurring.  No impingement  L1-L2: Rightward disc collapse with far-lateral bulging and ridging. Asymmetric right facet spurring. Moderate right foraminal narrowing. Noncompressive right subarticular recess stenosis.  L2-L3: Disc narrowing and bulging with retrolisthesis. Mild facet spurring. Mild to moderate left foraminal narrowing.  L3-L4: Disc narrowing and left eccentric bulging. Degenerative facet spurring asymmetric to the left. Mild left subarticular recess stenosis.  L4-L5: Facet osteoarthritis with spurring and anterolisthesis. Moderate disc  space narrowing with mild bulging. Bilateral root sleeve cysts. Mild to moderate spinal stenosis with asymmetric left subarticular recess narrowing  L5-S1:Disc narrowing and minor bulging. Mild facet spurring. No neural compression.  IMPRESSION: 1. Diffuse disc and facet degeneration with scoliosis and multilevel listhesis, progressed from 2015. 2. Mild to moderate foraminal narrowing on the right at L1-2 and left at L2-3. 3. Mild to moderate spinal stenosis at L4-5 with asymmetric left subarticular recess narrowing.   Electronically Signed   By: Monte Fantasia M.D.   On: 05/08/2020 07:15 ---- MRI LUMBAR SPINE WITHOUT CONTRAST  IMPRESSION: 1. Mild broad-based disc bulges throughout the lumbar spine. 2. Mild bilateral facet arthropathy with mild bilateral lateral recess stenosis at L4-5.   Electronically Signed By: Kathreen Devoid On: 01/29/2014 12:05     Objective:  VS:  HT:    WT:   BMI:     BP:138/81  HR:86bpm  TEMP: ( )  RESP:  Physical Exam Vitals and nursing note reviewed.  Constitutional:      General: She is not in acute distress.    Appearance: Normal appearance. She is not ill-appearing.  HENT:     Head: Normocephalic and atraumatic.     Right Ear: External ear normal.     Left Ear: External ear normal.  Eyes:     Extraocular Movements: Extraocular movements intact.  Cardiovascular:     Rate and Rhythm: Normal rate.     Pulses: Normal pulses.  Pulmonary:     Effort: Pulmonary effort  is normal. No respiratory distress.  Abdominal:     General: There is no distension.     Palpations: Abdomen is soft.  Musculoskeletal:        General: Tenderness present.     Cervical back: Neck supple.     Right lower leg: No edema.     Left lower leg: No edema.     Comments: Patient has good distal strength with no pain over the greater trochanters.  No clonus or focal weakness.  Skin:    Findings: No erythema, lesion or rash.  Neurological:     General: No  focal deficit present.     Mental Status: She is alert and oriented to person, place, and time.     Sensory: No sensory deficit.     Motor: No weakness or abnormal muscle tone.     Coordination: Coordination normal.  Psychiatric:        Mood and Affect: Mood normal.        Behavior: Behavior normal.      Imaging: No results found.

## 2020-08-22 NOTE — Procedures (Signed)
Lumbosacral Transforaminal Epidural Steroid Injection - Sub-Pedicular Approach with Fluoroscopic Guidance  Patient: [name]      Date of Birth: 1945/05/28 MRN: [id number] PCP: Seward Carol, MD      Visit Date: 08/17/2020   Universal Protocol:    Date/Time: 08/17/2020  Consent Given By: the patient  Position: PRONE  Additional Comments: Vital signs were monitored before and after the procedure. Patient was prepped and draped in the usual sterile fashion. The correct patient, procedure, and site was verified.   Injection Procedure Details:   Procedure diagnoses: Lumbar radiculopathy [M54.16]    Meds Administered:  Meds ordered this encounter  Medications  . methylPREDNISolone acetate (DEPO-MEDROL) injection 80 mg    Laterality: Bilateral  Location/Site:  L4-L5  Needle:5.0 in., 22 ga.  Short bevel or Quincke spinal needle  Needle Placement: Transforaminal  Findings:    -Comments: Excellent flow of contrast along the nerve, nerve root and into the epidural space.  Procedure Details: After squaring off the end-plates to get a true AP view, the C-arm was positioned so that an oblique view of the foramen as noted above was visualized. The target area is just inferior to the "nose of the scotty dog" or sub pedicular. The soft tissues overlying this structure were infiltrated with 2-3 ml. of 1% Lidocaine without Epinephrine.  The spinal needle was inserted toward the target using a "trajectory" view along the fluoroscope beam.  Under AP and lateral visualization, the needle was advanced so it did not puncture dura and was located close the 6 O'Clock position of the pedical in AP tracterory. Biplanar projections were used to confirm position. Aspiration was confirmed to be negative for CSF and/or blood. A 1-2 ml. volume of Isovue-250 was injected and flow of contrast was noted at each level. Radiographs were obtained for documentation purposes.   After attaining the  desired flow of contrast documented above, a 0.5 to 1.0 ml test dose of 0.25% Marcaine was injected into each respective transforaminal space.  The patient was observed for 90 seconds post injection.  After no sensory deficits were reported, and normal lower extremity motor function was noted,   the above injectate was administered so that equal amounts of the injectate were placed at each foramen (level) into the transforaminal epidural space.   Additional Comments:  The patient tolerated the procedure well Dressing: 2 x 2 sterile gauze and Band-Aid    Post-procedure details: Patient was observed during the procedure. Post-procedure instructions were reviewed.  Patient left the clinic in stable condition.

## 2020-09-13 ENCOUNTER — Other Ambulatory Visit (HOSPITAL_BASED_OUTPATIENT_CLINIC_OR_DEPARTMENT_OTHER): Payer: Self-pay

## 2020-09-13 MED ORDER — VITAMIN D (ERGOCALCIFEROL) 1.25 MG (50000 UNIT) PO CAPS: 1.0000 | ORAL_CAPSULE | ORAL | 2 refills | Status: DC

## 2020-10-24 ENCOUNTER — Telehealth: Payer: Self-pay | Admitting: Interventional Cardiology

## 2020-10-24 DIAGNOSIS — I252 Old myocardial infarction: Secondary | ICD-10-CM

## 2020-10-24 DIAGNOSIS — R079 Chest pain, unspecified: Secondary | ICD-10-CM

## 2020-10-24 NOTE — Telephone Encounter (Signed)
pt has been having "twingy" pains in her rib area.. pt is under alot of stress.. she is wanting to get this checked out.

## 2020-10-24 NOTE — Telephone Encounter (Signed)
Called pt in regards to twingy pain.  She reports that she has had intermittent lower left side dull CP.  She took X2 NTG yesterday 6/26 which resolved CP and X1 today that stopped pain a little.  She reports pain is not bad enough to make her call 911.  She had a heart attack previously and this pain is not like heart attack pain.  She does not know if pain is r/t to constipation as she took a stool softner today. Or reflux she as a hx of reflux and sleeps propped up on 2 pillows.  Per pt she is under a lot of stress her husband has end stage renal disease.  She denies jaw/ shoulder pain, NV, and dizziness.  I reviewed with her the symptoms that would warrant an ED visit she verbalizes understanding.  She is requesting an OV with Dr. Irish Lack or an APP.  I requested that she call PCP office to inquire about indigestion/ gerd as she takes pantoprazole.  I will route message to MD nurse to review.

## 2020-10-25 ENCOUNTER — Encounter: Payer: Self-pay | Admitting: *Deleted

## 2020-10-25 ENCOUNTER — Other Ambulatory Visit: Payer: Self-pay

## 2020-10-25 MED ORDER — NITROGLYCERIN 0.4 MG SL SUBL: SUBLINGUAL_TABLET | SUBLINGUAL | 1 refills | Status: DC

## 2020-10-25 NOTE — Telephone Encounter (Signed)
Called and spoke with patient who reports she has not had any chest pain this AM but would like to move forward with the stress testing.  She does not feel as though she will be able to walk on the GXT and therefore a Pershing Proud has been ordered.  Reviewed instruction with her via phone but pt is aware the written instructions will be sent to her via [facility].  Pt aware to c/b if any further questions/concerns.  She was grateful for the call back and assistance.

## 2020-10-27 ENCOUNTER — Other Ambulatory Visit: Payer: Self-pay | Admitting: Cardiology

## 2020-11-02 ENCOUNTER — Encounter (HOSPITAL_COMMUNITY): Payer: Self-pay | Admitting: *Deleted

## 2020-11-02 ENCOUNTER — Telehealth (HOSPITAL_COMMUNITY): Payer: Self-pay | Admitting: *Deleted

## 2020-11-02 NOTE — Telephone Encounter (Signed)
Patient given detailed instructions per Myocardial Perfusion Study Information Sheet for the test on 11/09/20 at 1030. Patient notified to arrive 15 minutes early and that it is imperative to arrive on time for appointment to keep from having the test rescheduled.  If you need to cancel or reschedule your appointment, please call the office within 24 hours of your appointment. . Patient verbalized understanding.[facility] Mychart letter sent with instructions.

## 2020-11-09 ENCOUNTER — Ambulatory Visit (HOSPITAL_COMMUNITY): Payer: Medicare Other | Attending: Cardiology

## 2020-11-09 ENCOUNTER — Other Ambulatory Visit: Payer: Self-pay

## 2020-11-09 DIAGNOSIS — I252 Old myocardial infarction: Secondary | ICD-10-CM | POA: Diagnosis not present

## 2020-11-09 DIAGNOSIS — R079 Chest pain, unspecified: Secondary | ICD-10-CM | POA: Diagnosis not present

## 2020-11-09 LAB — MYOCARDIAL PERFUSION IMAGING
LV dias vol: 54 mL (ref 46–106)
LV sys vol: 16 mL
Peak HR: 90 {beats}/min
Rest HR: 70 {beats}/min
SDS: 5
SRS: 0
SSS: 5
TID: 1.19

## 2020-11-09 MED ORDER — REGADENOSON 0.4 MG/5ML IV SOLN
0.4000 mg | Freq: Once | INTRAVENOUS | Status: AC
  Administered 2020-11-09: 0.4 mg via INTRAVENOUS

## 2020-11-09 MED ORDER — TECHNETIUM TC 99M TETROFOSMIN IV KIT
9.8000 | PACK | Freq: Once | INTRAVENOUS | Status: AC | PRN
Start: 2020-11-09 — End: 2020-11-09
  Administered 2020-11-09: 9.8 via INTRAVENOUS
  Filled 2020-11-09: qty 10

## 2020-11-09 MED ORDER — TECHNETIUM TC 99M TETROFOSMIN IV KIT
30.5000 | PACK | Freq: Once | INTRAVENOUS | Status: AC | PRN
Start: 2020-11-09 — End: 2020-11-09
  Administered 2020-11-09: 30.5 via INTRAVENOUS
  Filled 2020-11-09: qty 31

## 2020-11-10 ENCOUNTER — Telehealth: Payer: Self-pay | Admitting: Physical Medicine and Rehabilitation

## 2020-11-10 NOTE — Telephone Encounter (Signed)
Patient called. She would like an appointment with Dr. Ernestina Patches. Her call back number is [phone]

## 2020-11-10 NOTE — Telephone Encounter (Signed)
Bilateral L4 TF on 08/17/20. Ok to repeat if helped, same problem/side, and no new injury?

## 2020-11-11 NOTE — Telephone Encounter (Signed)
Patient reports good relief from injections. Scheduled for 7/27 at 1400 with driver.

## 2020-11-23 ENCOUNTER — Ambulatory Visit: Payer: Self-pay

## 2020-11-23 ENCOUNTER — Ambulatory Visit (INDEPENDENT_AMBULATORY_CARE_PROVIDER_SITE_OTHER): Payer: Medicare Other | Admitting: Physical Medicine and Rehabilitation

## 2020-11-23 ENCOUNTER — Other Ambulatory Visit: Payer: Self-pay

## 2020-11-23 ENCOUNTER — Encounter: Payer: Self-pay | Admitting: Physical Medicine and Rehabilitation

## 2020-11-23 VITALS — BP 132/80 | HR 83

## 2020-11-23 DIAGNOSIS — M48062 Spinal stenosis, lumbar region with neurogenic claudication: Secondary | ICD-10-CM

## 2020-11-23 DIAGNOSIS — M5416 Radiculopathy, lumbar region: Secondary | ICD-10-CM | POA: Diagnosis not present

## 2020-11-23 MED ORDER — METHYLPREDNISOLONE ACETATE 80 MG/ML IJ SUSP
80.0000 mg | Freq: Once | INTRAMUSCULAR | Status: AC
  Administered 2020-11-23: 80 mg

## 2020-11-23 NOTE — Patient Instructions (Signed)

## 2020-11-23 NOTE — Progress Notes (Signed)
Pt state lower back pain that travels down both legs. Pt state standing and getting up from a sitting position makes the pain worse. Pt state she takes pain meds and uses heating to help ease her pain Pt has hx of inj on 08/17/20 pt state it helped.  Numeric Pain Rating Scale and Functional Assessment Average Pain 7   In the last MONTH (on 0-10 scale) has pain interfered with the following?  1. General activity like being  able to carry out your everyday physical activities such as walking, climbing stairs, carrying groceries, or moving a chair?  Rating(9)   +Driver, -BT, -Dye Allergies.

## 2020-11-25 ENCOUNTER — Other Ambulatory Visit: Payer: Self-pay | Admitting: Cardiology

## 2020-11-25 ENCOUNTER — Telehealth: Payer: Self-pay

## 2020-11-25 NOTE — Telephone Encounter (Signed)
Received voicemail from patient requesting a refill on her Brilinta rx.  Reviewed chart.   Rx was sent over earlier today.   Patient made aware and follow up appointment scheduled with Dr Irish Lack.   Patient voiced understanding.

## 2020-11-25 NOTE — Progress Notes (Signed)
[name] - 75 y.o. female MRN [id number]  Date of birth: 1945-06-09  Office Visit Note: Visit Date: 11/23/2020 PCP: Seward Carol, MD Referred by: Seward Carol, MD  Subjective: Chief Complaint  Patient presents with   Lower Back - Pain   Left Leg - Pain   Right Leg - Pain   HPI:  [name] is a 75 y.o. female who comes in today for planned repeat Bilateral L4-L5  Lumbar Transforaminal epidural steroid injection with fluoroscopic guidance.  The patient has failed conservative care including home exercise, medications, time and activity modification.  This injection will be diagnostic and hopefully therapeutic.  Please see requesting physician notes for further details and justification. Patient received more than 50% pain relief from prior injection.  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  He has since passed away a couple weeks ago.  She is dealing with ongoing stress and having increased pain at this point.  Her case is complicated by fibromyalgia.  Referring: Dr. Meridee Score   ROS Otherwise per HPI.  Assessment & Plan: Visit Diagnoses:    ICD-10-CM   1. Lumbar radiculopathy  M54.16 XR C-ARM NO REPORT    Epidural Steroid injection    methylPREDNISolone acetate (DEPO-MEDROL) injection 80 mg    2. Spinal stenosis of lumbar region with neurogenic claudication  M48.062 XR C-ARM NO REPORT    Epidural Steroid injection    methylPREDNISolone acetate (DEPO-MEDROL) injection 80 mg      Plan: No additional findings.   Meds & Orders:  Meds ordered this encounter  Medications   methylPREDNISolone acetate (DEPO-MEDROL) injection 80 mg    Orders Placed This Encounter  Procedures   XR C-ARM NO REPORT   Epidural Steroid injection    Follow-up: Return if symptoms worsen or fail to improve.   Procedures: No procedures performed  Lumbosacral Transforaminal Epidural Steroid Injection - Sub-Pedicular  Approach with Fluoroscopic Guidance  Patient: [name]      Date of Birth: December 01, 1945 MRN: [id number] PCP: Seward Carol, MD      Visit Date: 11/23/2020   Universal Protocol:    Date/Time: 11/23/2020  Consent Given By: the patient  Position: PRONE  Additional Comments: Vital signs were monitored before and after the procedure. Patient was prepped and draped in the usual sterile fashion. The correct patient, procedure, and site was verified.   Injection Procedure Details:   Procedure diagnoses: Lumbar radiculopathy [M54.16]    Meds Administered:  Meds ordered this encounter  Medications   methylPREDNISolone acetate (DEPO-MEDROL) injection 80 mg    Laterality: Bilateral  Location/Site:  L4-L5  Needle:5.0 in., 22 ga.  Short bevel or Quincke spinal needle  Needle Placement: Transforaminal  Findings:    -Comments: Excellent flow of contrast along the nerve, nerve root and into the epidural space.  Procedure Details: After squaring off the end-plates to get a true AP view, the C-arm was positioned so that an oblique view of the foramen as noted above was visualized. The target area is just inferior to the "nose of the scotty dog" or sub pedicular. The soft tissues overlying this structure were infiltrated with 2-3 ml. of 1% Lidocaine without Epinephrine.  The spinal needle was inserted toward the target using a "trajectory" view along the fluoroscope beam.  Under AP and lateral visualization, the needle was advanced so it did not puncture dura and was located close the 6 O'Clock position of the  pedical in AP tracterory. Biplanar projections were used to confirm position. Aspiration was confirmed to be negative for CSF and/or blood. A 1-2 ml. volume of Isovue-250 was injected and flow of contrast was noted at each level. Radiographs were obtained for documentation purposes.   After attaining the desired flow of contrast documented above, a 0.5 to 1.0 ml test dose of  0.25% Marcaine was injected into each respective transforaminal space.  The patient was observed for 90 seconds post injection.  After no sensory deficits were reported, and normal lower extremity motor function was noted,   the above injectate was administered so that equal amounts of the injectate were placed at each foramen (level) into the transforaminal epidural space.   Additional Comments:  The patient tolerated the procedure well Dressing: 2 x 2 sterile gauze and Band-Aid    Post-procedure details: Patient was observed during the procedure. Post-procedure instructions were reviewed.  Patient left the clinic in stable condition.    Clinical History: MRI LUMBAR SPINE WITHOUT CONTRAST   TECHNIQUE: Multiplanar, multisequence MR imaging of the lumbar spine was performed. No intravenous contrast was administered.   COMPARISON:  01/29/2014   FINDINGS: Segmentation:  5 lumbar type vertebrae   Alignment: Levoscoliosis. Grade 1 anterolisthesis at L4-5. Mild retrolisthesis at L1-2 and L2-3. Misalignment has progressed from 2015.   Vertebrae:  No fracture, evidence of discitis, or bone lesion.   Conus medullaris and cauda equina: Conus extends to the L1-2 level. Conus and cauda equina appear normal. 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   Disc levels:   T12- L1: Disc narrowing and ventral spurring.  No impingement   L1-L2: Rightward disc collapse with far-lateral bulging and ridging. Asymmetric right facet spurring. Moderate right foraminal narrowing. Noncompressive right subarticular recess stenosis.   L2-L3: Disc narrowing and bulging with retrolisthesis. Mild facet spurring. Mild to moderate left foraminal narrowing.   L3-L4: Disc narrowing and left eccentric bulging. Degenerative facet spurring asymmetric to the left. Mild left subarticular recess stenosis.   L4-L5: Facet osteoarthritis with  spurring and anterolisthesis. Moderate disc space narrowing with mild bulging. Bilateral root sleeve cysts. Mild to moderate spinal stenosis with asymmetric left subarticular recess narrowing   L5-S1:Disc narrowing and minor bulging. Mild facet spurring. No neural compression.   IMPRESSION: 1. Diffuse disc and facet degeneration with scoliosis and multilevel listhesis, progressed from 2015. 2. Mild to moderate foraminal narrowing on the right at L1-2 and left at L2-3. 3. Mild to moderate spinal stenosis at L4-5 with asymmetric left subarticular recess narrowing.     Electronically Signed   By: Monte Fantasia M.D.   On: 05/08/2020 07:15 ---- MRI LUMBAR SPINE WITHOUT CONTRAST  IMPRESSION: 1. Mild broad-based disc bulges throughout the lumbar spine. 2. Mild bilateral facet arthropathy with mild bilateral lateral recess stenosis at L4-5.   Electronically Signed By: Kathreen Devoid On: 01/29/2014 12:05     Objective:  VS:  HT:    WT:   BMI:     BP:132/80  HR:83bpm  TEMP: ( )  RESP:  Physical Exam Vitals and nursing note reviewed.  Constitutional:      General: She is not in acute distress.    Appearance: Normal appearance. She is not ill-appearing.  HENT:     Head: Normocephalic and atraumatic.     Right Ear: External ear normal.     Left Ear: External ear normal.  Eyes:  Extraocular Movements: Extraocular movements intact.  Cardiovascular:     Rate and Rhythm: Normal rate.     Pulses: Normal pulses.  Pulmonary:     Effort: Pulmonary effort is normal. No respiratory distress.  Abdominal:     General: There is no distension.     Palpations: Abdomen is soft.  Musculoskeletal:        General: Tenderness present.     Cervical back: Neck supple.     Right lower leg: No edema.     Left lower leg: No edema.     Comments: Patient has good distal strength with no pain over the greater trochanters.  No clonus or focal weakness.  Skin:    Findings: No erythema,  lesion or rash.  Neurological:     General: No focal deficit present.     Mental Status: She is alert and oriented to person, place, and time.     Sensory: No sensory deficit.     Motor: No weakness or abnormal muscle tone.     Coordination: Coordination normal.  Psychiatric:        Mood and Affect: Mood normal.        Behavior: Behavior normal.     Imaging: No results found.

## 2020-11-25 NOTE — Procedures (Signed)
Lumbosacral Transforaminal Epidural Steroid Injection - Sub-Pedicular Approach with Fluoroscopic Guidance  Patient: [name]      Date of Birth: 1945-08-12 MRN: [id number] PCP: Seward Carol, MD      Visit Date: 11/23/2020   Universal Protocol:    Date/Time: 11/23/2020  Consent Given By: the patient  Position: PRONE  Additional Comments: Vital signs were monitored before and after the procedure. Patient was prepped and draped in the usual sterile fashion. The correct patient, procedure, and site was verified.   Injection Procedure Details:   Procedure diagnoses: Lumbar radiculopathy [M54.16]    Meds Administered:  Meds ordered this encounter  Medications   methylPREDNISolone acetate (DEPO-MEDROL) injection 80 mg    Laterality: Bilateral  Location/Site:  L4-L5  Needle:5.0 in., 22 ga.  Short bevel or Quincke spinal needle  Needle Placement: Transforaminal  Findings:    -Comments: Excellent flow of contrast along the nerve, nerve root and into the epidural space.  Procedure Details: After squaring off the end-plates to get a true AP view, the C-arm was positioned so that an oblique view of the foramen as noted above was visualized. The target area is just inferior to the "nose of the scotty dog" or sub pedicular. The soft tissues overlying this structure were infiltrated with 2-3 ml. of 1% Lidocaine without Epinephrine.  The spinal needle was inserted toward the target using a "trajectory" view along the fluoroscope beam.  Under AP and lateral visualization, the needle was advanced so it did not puncture dura and was located close the 6 O'Clock position of the pedical in AP tracterory. Biplanar projections were used to confirm position. Aspiration was confirmed to be negative for CSF and/or blood. A 1-2 ml. volume of Isovue-250 was injected and flow of contrast was noted at each level. Radiographs were obtained for documentation purposes.   After attaining the  desired flow of contrast documented above, a 0.5 to 1.0 ml test dose of 0.25% Marcaine was injected into each respective transforaminal space.  The patient was observed for 90 seconds post injection.  After no sensory deficits were reported, and normal lower extremity motor function was noted,   the above injectate was administered so that equal amounts of the injectate were placed at each foramen (level) into the transforaminal epidural space.   Additional Comments:  The patient tolerated the procedure well Dressing: 2 x 2 sterile gauze and Band-Aid    Post-procedure details: Patient was observed during the procedure. Post-procedure instructions were reviewed.  Patient left the clinic in stable condition.

## 2020-12-25 ENCOUNTER — Other Ambulatory Visit: Payer: Self-pay | Admitting: Cardiology

## 2021-01-03 ENCOUNTER — Other Ambulatory Visit: Payer: Self-pay | Admitting: Oncology

## 2021-01-03 DIAGNOSIS — Z9889 Other specified postprocedural states: Secondary | ICD-10-CM

## 2021-01-05 ENCOUNTER — Encounter: Payer: Self-pay | Admitting: Nurse Practitioner

## 2021-01-05 ENCOUNTER — Other Ambulatory Visit: Payer: Self-pay

## 2021-01-05 ENCOUNTER — Ambulatory Visit (INDEPENDENT_AMBULATORY_CARE_PROVIDER_SITE_OTHER): Payer: Medicare Other | Admitting: Nurse Practitioner

## 2021-01-05 VITALS — BP 130/80

## 2021-01-05 DIAGNOSIS — N3000 Acute cystitis without hematuria: Secondary | ICD-10-CM | POA: Diagnosis not present

## 2021-01-05 DIAGNOSIS — R102 Pelvic and perineal pain: Secondary | ICD-10-CM

## 2021-01-05 DIAGNOSIS — N898 Other specified noninflammatory disorders of vagina: Secondary | ICD-10-CM | POA: Diagnosis not present

## 2021-01-05 LAB — WET PREP FOR TRICH, YEAST, CLUE

## 2021-01-05 MED ORDER — SULFAMETHOXAZOLE-TRIMETHOPRIM 800-160 MG PO TABS: 1.0000 | ORAL_TABLET | Freq: Two times a day (BID) | ORAL | 0 refills | Status: AC

## 2021-01-05 NOTE — Progress Notes (Signed)
   Acute Office Visit  Subjective:    Patient ID: [name], female    DOB: 1945-09-20, 75 y.o.   MRN: [id number]   HPI 75 y.o. presents today for vaginal discharge and irritation that started a couple of weeks ago. She took Diflucan and Terazol about 2 weeks ago with no improvement. She has history of atrophic vaginitis s/t Anastrozole use. Using Vitamin E suppositories and Replens. She also complains of pelvic pressure, malaise, and burning with urination.   Review of Systems  Constitutional:  Positive for fatigue. Negative for chills and fever.  Genitourinary:  Positive for dysuria, frequency, pelvic pain, urgency, vaginal discharge and vaginal pain (burning). Negative for flank pain and hematuria.      Objective:    Physical Exam Constitutional:      Appearance: Normal appearance.  Abdominal:     Tenderness: There is no right CVA tenderness or left CVA tenderness.  Genitourinary:    General: Normal vulva.     Vagina: Normal.     Cervix: Normal.     Comments: Atrophic changes   BP 130/80   LMP 04/30/1996 (Approximate)  Wt Readings from Last 3 Encounters:  11/09/20 151 lb (68.5 kg)  07/07/20 153 lb 4.8 oz (69.5 kg)  04/12/20 154 lb (69.9 kg)   Wet prep negative UA leukocytes 2+, nitrates negative, trace blood, SG 1.010, yellow/cloudy. Microscopic: wbc 10-20, rbc 0-2, few bacteria     Assessment & Plan:   Problem List Items Addressed This Visit   None Visit Diagnoses     Acute cystitis without hematuria    -  Primary   Relevant Medications   sulfamethoxazole-trimethoprim (BACTRIM DS) 800-160 MG tablet   Vaginal discharge       Relevant Orders   WET PREP FOR [place], YEAST, CLUE (Completed)   Female pelvic pain       Relevant Orders   Urinalysis,Complete w/RFL Culture (Completed)      Plan: Urinalysis suggestive of UTI - Bactrim 800-160 mg BID x 3 days. Culture pending. Reassurance provided on negative wet prep and that symptoms are likely from chronic  vaginitis. Recommend using coconut oil for hydration along with Vitamin E suppositories. She is agreeable to plan.      Tamela Gammon DNP, 4:38 PM 01/05/2021

## 2021-01-07 LAB — URINALYSIS, COMPLETE W/RFL CULTURE
Bilirubin Urine: NEGATIVE
Casts: NONE SEEN /LPF
Crystals: NONE SEEN /HPF
Glucose, UA: NEGATIVE
Hyaline Cast: NONE SEEN /LPF
Ketones, ur: NEGATIVE
Nitrites, Initial: NEGATIVE
Protein, ur: NEGATIVE
Specific Gravity, Urine: 1.01 (ref 1.001–1.035)
Yeast: NONE SEEN /HPF
pH: 5.5 (ref 5.0–8.0)

## 2021-01-07 LAB — CULTURE INDICATED

## 2021-01-07 LAB — URINE CULTURE
MICRO NUMBER:: 12347388
SPECIMEN QUALITY:: ADEQUATE

## 2021-01-09 ENCOUNTER — Encounter: Payer: Self-pay | Admitting: Nurse Practitioner

## 2021-01-11 ENCOUNTER — Encounter: Payer: Self-pay | Admitting: *Deleted

## 2021-01-11 ENCOUNTER — Inpatient Hospital Stay: Payer: Medicare Other

## 2021-01-11 ENCOUNTER — Other Ambulatory Visit: Payer: Self-pay

## 2021-01-11 ENCOUNTER — Inpatient Hospital Stay: Payer: Medicare Other | Attending: Oncology | Admitting: Oncology

## 2021-01-11 VITALS — BP 154/81 | HR 67 | Temp 97.9°F | Resp 18 | Ht 62.0 in | Wt 147.2 lb

## 2021-01-11 DIAGNOSIS — Z87891 Personal history of nicotine dependence: Secondary | ICD-10-CM | POA: Insufficient documentation

## 2021-01-11 DIAGNOSIS — Z170 Estrogen receptor positive status [ER+]: Secondary | ICD-10-CM

## 2021-01-11 DIAGNOSIS — Z923 Personal history of irradiation: Secondary | ICD-10-CM | POA: Insufficient documentation

## 2021-01-11 DIAGNOSIS — C773 Secondary and unspecified malignant neoplasm of axilla and upper limb lymph nodes: Secondary | ICD-10-CM | POA: Insufficient documentation

## 2021-01-11 DIAGNOSIS — Z9221 Personal history of antineoplastic chemotherapy: Secondary | ICD-10-CM | POA: Diagnosis not present

## 2021-01-11 DIAGNOSIS — C50411 Malignant neoplasm of upper-outer quadrant of right female breast: Secondary | ICD-10-CM | POA: Diagnosis present

## 2021-01-11 LAB — CBC WITH DIFFERENTIAL/PLATELET
Abs Immature Granulocytes: 0.01 10*3/uL (ref 0.00–0.07)
Basophils Absolute: 0 10*3/uL (ref 0.0–0.1)
Basophils Relative: 1 %
Eosinophils Absolute: 0.1 10*3/uL (ref 0.0–0.5)
Eosinophils Relative: 1 %
HCT: 41.3 % (ref 36.0–46.0)
Hemoglobin: 13.9 g/dL (ref 12.0–15.0)
Immature Granulocytes: 0 %
Lymphocytes Relative: 21 %
Lymphs Abs: 1.7 10*3/uL (ref 0.7–4.0)
MCH: 31.7 pg (ref 26.0–34.0)
MCHC: 33.7 g/dL (ref 30.0–36.0)
MCV: 94.3 fL (ref 80.0–100.0)
Monocytes Absolute: 0.5 10*3/uL (ref 0.1–1.0)
Monocytes Relative: 6 %
Neutro Abs: 5.6 10*3/uL (ref 1.7–7.7)
Neutrophils Relative %: 71 %
Platelets: 255 10*3/uL (ref 150–400)
RBC: 4.38 MIL/uL (ref 3.87–5.11)
RDW: 13.1 % (ref 11.5–15.5)
WBC: 7.9 10*3/uL (ref 4.0–10.5)
nRBC: 0 % (ref 0.0–0.2)

## 2021-01-11 LAB — COMPREHENSIVE METABOLIC PANEL
ALT: 17 U/L (ref 0–44)
AST: 19 U/L (ref 15–41)
Albumin: 3.9 g/dL (ref 3.5–5.0)
Alkaline Phosphatase: 82 U/L (ref 38–126)
Anion gap: 11 (ref 5–15)
BUN: 16 mg/dL (ref 8–23)
CO2: 24 mmol/L (ref 22–32)
Calcium: 9.5 mg/dL (ref 8.9–10.3)
Chloride: 107 mmol/L (ref 98–111)
Creatinine, Ser: 0.87 mg/dL (ref 0.44–1.00)
GFR, Estimated: >60 mL/min (ref >60)
Glucose, Bld: 105 mg/dL — ABNORMAL HIGH (ref 70–99)
Potassium: 4.2 mmol/L (ref 3.5–5.1)
Sodium: 142 mmol/L (ref 135–145)
Total Bilirubin: 0.5 mg/dL (ref 0.3–1.2)
Total Protein: 7.2 g/dL (ref 6.5–8.1)

## 2021-01-11 MED ORDER — PROMETHAZINE HCL 25 MG PO TABS: 12.5000 mg | ORAL_TABLET | Freq: Four times a day (QID) | ORAL | 4 refills | Status: DC | PRN

## 2021-01-11 NOTE — Progress Notes (Signed)
[place]  [phone] [phone]     ID: [name] DOB: 1946/02/27  MR#: [id number]  QVZ#:[id number]  Patient Care Team: Seward Carol, MD as PCP - General (Internal Medicine) Jettie Booze, MD as PCP - Cardiology (Cardiology) Brasen Bundren, Virgie Dad, MD as Consulting Physician (Oncology) Fanny Skates, MD as Consulting Physician (General Surgery) Luberta Robertson, MD (Radiology) Hennie Duos, MD as Consulting Physician (Rheumatology) Domingo Pulse, MD (Urology) Druscilla Brownie, MD as Consulting Physician (Dermatology) Luberta Mutter, MD as Consulting Physician (Ophthalmology) Magnus Sinning, MD as Consulting Physician (Physical Medicine and Rehabilitation) Megan Salon, MD as Consulting Physician (Gynecology) Loney Loh, MD (Dermatology) OTHER MD:   CHIEF COMPLAINT: Estrogen receptor positive breast cancer  CURRENT TREATMENT:  Anastrozole; PALLAS trial (observation)   INTERVAL HISTORY: [name] returns today for follow-up of her estrogen receptor positive breast cancer.    Her husband [name] died in December 02, 2022.  The last few months were very difficult for the whole family and particularly for when.  She is still living in the same house, by herself, and has The caregiver.  She is grieving appropriately.  She continues on anastrozole.  She tolerates this with no significant side effects  [name]'s last bone density screening on 01/30/2017 showed a T-score of -2.7, which is considered osteoporotic.   She is scheduled for annual mammogram on 02/16/2021.   REVIEW OF SYSTEMS: [name] has had some erythema and the right arm and usually she takes her own Keflex but has run out.  She is staying in close contact with her daughters.  She is very active in the local community.  A detailed review of systems today was otherwise    COVID 19 VACCINATION STATUS: Status post [place] x2 followed by the booster August 2021   BREAST  CANCER HISTORY: From the original intake note:  Nadelyn had routine mammographic screening in 08/18/2015 at the [facility], showing a right breast mass and possible axillary adenopathy. On 08/23/2015 she underwent bilateral diagnostic mammography with tomography and bilateral breast ultrasonography. The breast density was category B in the right breast there was a spiculated mass in the upper-outer quadrant measuring 1.2 cm and associated with pleomorphic calcifications. There were calcifications extending anteriorly and posteriorly from the mass worrisome for ductal carcinoma in situ. The total area in question is about 3 cm. There were also multiple enlarged right axillary lymph nodes. The mass was palpated as an area of fullness at the 12:00 position of the breast 3 cm from the nipple. There were palpable enlarged right axillary lymph nodes in the right axilla. Ultrasonography of the right breast confirmed an irregular hypoechoic mass in the area in question measuring 1.6 cm. There were multiple abnormal appearing enlarged right axillary lymph nodes, the largest measuring 2.3 cm.  In the left breast, mammography showed an area of architectural distortion associated with the left nipple. This was not palpable. Ultrasonography of the left breast found no correlate. This area of the left breast was biopsied under tomography 08/24/2015. This showed (SAA [id number]) a complex sclerosing lesion and fibroadenoma.  On 08/23/2015 the patient underwent biopsy of the right breast mass in 1 of the abnormal right axillary lymph nodes. 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 Both biopsies were HER-2 nonamplified, the breast being ratio 1.27 and number per cell 1.90, and the lymph  node 1 ratio 1.24 and number per cell 3.0.  On 08/26/2015 the patient underwent  bilateral breast MRI. In the left breast there was no suspicious enhancement. The left axilla was benign. In the right breast there was a mass described as in the upper inner quadrant measuring 2.3 cm. Less than a centimeter away from this mass there was a satellite nodule measuring 0.9 cm. In the right axilla there were multiple enlarged right axillary lymph nodes, including a confluent region measuring up to 4 cm. There were also enlarged level II lymph nodes present measuring up to 2.1 cm.  The patient's subsequent history is as detailed below   PAST MEDICAL HISTORY: Past Medical History:  Diagnosis Date   Anxiety    Arthritis 2008   Asthma 1953   age 2   Breast cancer ([place]) dx'd 2017   right   Cancer [facility])    breast cancer   Chronic vaginitis 1998   Fibromyalgia 1982   GERD (gastroesophageal reflux disease)    since at least 2011   Hemorrhoids 2009   History of radiation therapy 01/18/16-03/05/16   right breast/nodal areas 45 Gy, right breast boost 16 Gy   Hypoglycemia 2003   Hypothyroidism 1964   age 88   Interstitial cystitis 1998   Irritable bowel syndrome 06/14/2009   Qualifier: Diagnosis of  By: Patsy Baltimore RN, Denise     Menopausal symptoms    Osteoporosis 12/24/2006   Personal history of chemotherapy    Personal history of radiation therapy    Pneumonia 1979   varicella   PONV (postoperative nausea and vomiting) 1979    PAST SURGICAL HISTORY: Past Surgical History:  Procedure Laterality Date   BLADDER HYPEREXTENSION     X4, prior to 01/2006   [place] LUMPECTOMY Right    2017   BREAST LUMPECTOMY WITH NEEDLE LOCALIZATION AND AXILLARY LYMPH NODE DISSECTION Right 12/05/2015   Procedure: RIGHT BREAST NEEDLE LOCALIZED (WIRES X 2) LUMPECTOMY WITH AXILLARY LYMPH NODE DISSECTION;  Surgeon: Fanny Skates, MD;  Location: [place];  Service: General;  Laterality: Right;   BREAST LUMPECTOMY WITH RADIOACTIVE SEED LOCALIZATION Left 12/05/2015    Procedure: RADIOACTIVE SEED GUIDED LEFT BREAST LUMPECTOMY;  Surgeon: Fanny Skates, MD;  Location: [place];  Service: General;  Laterality: Left;   BREAST SURGERY     BUNIONECTOMY     prior to 01/2006   [place]  02/08/2006   DR. INGRAM   COLONOSCOPY     CORONARY STENT INTERVENTION N/A 01/01/2017   Procedure: CORONARY STENT INTERVENTION;  Surgeon: Jettie Booze, MD;  Location: [place] [facility];  Service: Cardiovascular;  Laterality: N/A;   DENTAL SURGERY  2009-2016   13 dental implants   LEFT HEART CATH AND CORONARY ANGIOGRAPHY N/A 01/01/2017   Procedure: LEFT HEART CATH AND CORONARY ANGIOGRAPHY;  Surgeon: Jettie Booze, MD;  Location: [place] [facility];  Service: Cardiovascular;  Laterality: N/A;   LEG SURGERY  07/06/2004   Broken tibia   NASAL SINUS SURGERY     X4; prior to 01/2006   [facility] REMOVAL Right 12/05/2015   Procedure: REMOVAL PORT-A-CATH;  Surgeon: Fanny Skates, MD;  Location: [place];  Service: General;  Laterality: Right;   PORTACATH PLACEMENT Left 09/08/2015   Procedure: INSERTION OF PORT-A-CATH ;  Surgeon: Fanny Skates, MD;  Location: [place];  Service: General;  Laterality: Left;  FAMILY HISTORY Family History  Problem Relation Age of Onset   Hypertension Mother    Heart disease Mother        d. 7   Parkinsonism Father        d. 8   Heart disease Sister    Breast cancer Sister 45       s/p mastectomy; reportedly negative BRCA1/2 testing approx 7 years ago   Melanoma Sister        d. 35s; hx of incresed sun exposure   Cancer Sister        dx. sarcoma of her face in her 39s; treated w/ MOHS surgery   Parkinsonism Paternal Aunt        d. 90s   Parkinsonism Paternal Uncle        d. late 38s   Kidney failure Maternal Grandmother 46   Heart attack Maternal Grandfather        d. 68s   Heart disease Paternal Grandmother        d. 19   Breast cancer Other 52       maternal great  aunt (MGF's sister)   Kidney cancer Paternal Uncle 28       d. 63s; unsure about smoking status  The patient's father died at age 66 from complications of Parkinson's disease. The patient's mother died age 50 with heart disease. Aubrey had no brothers. She had 2 sisters. One sister was diagnosed with breast cancer at age 78. She was tested for the BRCA gene and was negative. The same sister also had melanoma diagnosed in her 43s. The patient has one maternal great aunt diagnosed with breast cancer around age 70. There is no history of ovarian cancer in the family   GYNECOLOGIC HISTORY:  Patient's last menstrual period was 04/30/1996 (approximate). Menarche age 54, first live birth age 35. The patient is GX P2. She stopped having periods in her early 34s and took hormone replacement until her breast cancer diagnosis, April 2017.   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 Husband [name] had a history of non-Hodgkin's lymphoma and was status post first auto and then allo transplants. He had been in remission for more than 10 years. He passed away in 11-26-2020 with dementia. Their daughter Nira Conn lives in [place] were she is Garment/textile technologist of the [employer]. Daughter Belenda Cruise is an infectious disease M.D. working and teaching at [employer] in [place]. The patient has 2 grandchildren. The family attends [employer]: Not in place   HEALTH MAINTENANCE: Social History   Tobacco Use   Smoking status: Former    Packs/day: 0.25    Years: 15.00    Pack years: 3.75    Types: Cigarettes   Smokeless tobacco: Never   Tobacco comments:    social smoker/"on and off"  Vaping Use   Vaping Use: Never used  Substance Use Topics   Alcohol use: Yes    Alcohol/week: 0.0 standard drinks    Comment: social   Drug use: No     Colonoscopy: 2016/ [facility]  PAP: 11/2018,  negative  Bone density: October 2018, T-score: -2.7  Lipid panel:  Allergies  Allergen Reactions   Chlorhexidine Other (See Comments)    [place] IS OK. The CLOTH WIPE causes reaction. Causes itching all over the area   Other Rash    Adhesive tape    Current  Outpatient Medications  Medication Sig Dispense Refill   promethazine (PHENERGAN) 25 MG tablet Take 0.5-1 tablets (12.5-25 mg total) by mouth every 6 (six) hours as needed for nausea or vomiting. 90 tablet 4   albuterol (PROVENTIL HFA;VENTOLIN HFA) 108 (90 Base) MCG/ACT inhaler Inhale 1-2 puffs into the lungs every 6 (six) hours as needed for wheezing or shortness of breath. Started in 1998     anastrozole (ARIMIDEX) 1 MG tablet Take 1 tablet (1 mg total) by mouth daily. 90 tablet 4   BRILINTA 90 MG TABS tablet TAKE 1 TABLET(90 MG) BY MOUTH TWICE DAILY 60 tablet 2   buPROPion (WELLBUTRIN XL) 300 MG 24 hr tablet Take 300 mg by mouth daily.      Calcium Carbonate-Vitamin D (CALCIUM + D PO) Take 1 tablet by mouth at bedtime. Started approximately 1998     carisoprodol (SOMA) 350 MG tablet Take 350 mg by mouth 2 (two) times daily as needed for muscle spasms. Started in 2008.  2   cephALEXin (KEFLEX) 500 MG capsule Take 1 capsule (500 mg total) by mouth 2 (two) times daily. 20 capsule 0   diazepam (VALIUM) 5 MG tablet Take 5 mg by mouth every 6 (six) hours as needed for muscle spasms (interstitial cystitis). Started approximately 2008.     furosemide (LASIX) 20 MG tablet TAKE 1 TABLET(20 MG) BY MOUTH DAILY AS NEEDED FOR SHORTNESS OF BREATH 30 tablet 10   gabapentin (NEURONTIN) 300 MG capsule TAKE 3 TO 4 CAPSULES(900 TO 1200 MG) BY MOUTH AT BEDTIME 120 capsule 4   HYDROcodone-acetaminophen (NORCO) 10-325 MG tablet Take 1 tablet by mouth every 4 (four) hours as needed for moderate pain or severe pain (as needed for fibromyalgia and IC).      hydrOXYzine (ATARAX/VISTARIL) 25 MG tablet Take 1 tablet by mouth at bedtime.     levothyroxine  (SYNTHROID, LEVOTHROID) 75 MCG tablet Take 75 mcg by mouth daily.  2   LORazepam (ATIVAN) 2 MG tablet Take 1 tablet by mouth daily as needed for anxiety.   5   nitroGLYCERIN (NITROSTAT) 0.4 MG SL tablet For chest pain, place 1 tablet under tongue and let dissolve, wait 5 mins if still having chest pain you may repeat this 2 more times. If still having chest pain call 911 or have someone drive you to the closest ER. 25 tablet 1   NONFORMULARY OR COMPOUNDED ITEM Vit E vaginal suppositories 28m/ml.  One pv three times weekly.  #36/4RF 36 each 4   ondansetron (ZOFRAN) 4 MG tablet Take 1-2 tablets (4-8 mg total) by mouth every 8 (eight) hours as needed for nausea or vomiting. 60 tablet 4   ondansetron (ZOFRAN) 8 MG tablet TAKE 1 TABLET EVERY 8 HOURS AS NEEDED FOR NAUSEA AND VOMITING. 20 tablet 1   pantoprazole (PROTONIX) 40 MG tablet Take 40 mg by mouth daily. Started in 2012.     potassium chloride (KLOR-CON) 10 MEQ tablet TAKE 1 TABLET BY MOUTH AS NEEDED. TAKE WITH LASIX 30 tablet 10   prochlorperazine (COMPAZINE) 10 MG tablet Take 1 tablet (10 mg total) by mouth every 6 (six) hours as needed for nausea or vomiting. 385 tablet 1   rosuvastatin (CRESTOR) 40 MG tablet Take 1 tablet (40 mg total) by mouth daily. TAKE 1 TABLET(40 MG) BY MOUTH DAILY 90 tablet 3   Vaginal Lubricant (REPLENS VA) Place vaginally daily.     Vitamin D, Ergocalciferol, (DRISDOL) 1.25 MG (50000 UNIT) CAPS capsule Take 1 capsule (50,000 Units total)  by mouth every 7 (seven) days. 12 capsule 2   zolpidem (AMBIEN) 10 MG tablet Take 10 mg by mouth at bedtime as needed for sleep.     No current facility-administered medications for this visit.    OBJECTIVE: white woman who appears younger than stated age  11:   01/11/21 1544  BP: (!) 154/81  Pulse: 67  Resp: 18  Temp: 97.9 F (36.6 C)  SpO2: 92%     Body mass index is 26.92 kg/m.    ECOG FS:1 - Symptomatic but completely ambulatory  Sclerae unicteric, EOMs  intact Wearing a mask No cervical or supraclavicular adenopathy Lungs no rales or rhonchi Heart regular rate and rhythm Abd soft, nontender, positive bowel sounds MSK no focal spinal tenderness, no upper extremity lymphedema Neuro: nonfocal, well oriented, appropriate affect Breasts: The right breast has undergone lumpectomy and radiation.  There is no evidence of local recurrence.  There is an area of erythema which over the years has become less and less marked.  It is imaged below.  The left breast and both axillae are benign  Right breast 01/11/2021   LAB RESULTS:  CMP     Component Value Date/Time   NA 142 01/11/2021 1434   NA 138 03/11/2017 1524   K 4.2 01/11/2021 1434   K 3.8 03/11/2017 1524   CL 107 01/11/2021 1434   CO2 24 01/11/2021 1434   CO2 25 03/11/2017 1524   GLUCOSE 105 (H) 01/11/2021 1434   GLUCOSE 136 03/11/2017 1524   BUN 16 01/11/2021 1434   BUN 13.2 03/11/2017 1524   CREATININE 0.87 01/11/2021 1434   CREATININE 0.83 02/24/2019 1224   CREATININE 0.8 03/11/2017 1524   CALCIUM 9.5 01/11/2021 1434   CALCIUM 9.6 03/11/2017 1524   PROT 7.2 01/11/2021 1434   PROT 6.6 04/11/2020 1109   PROT 7.6 03/11/2017 1524   ALBUMIN 3.9 01/11/2021 1434   ALBUMIN 4.1 04/11/2020 1109   ALBUMIN 3.8 03/11/2017 1524   AST 19 01/11/2021 1434   AST 19 02/24/2019 1224   AST 24 03/11/2017 1524   ALT 17 01/11/2021 1434   ALT 15 02/24/2019 1224   ALT 38 03/11/2017 1524   ALKPHOS 82 01/11/2021 1434   ALKPHOS 166 (H) 03/11/2017 1524   BILITOT 0.5 01/11/2021 1434   BILITOT 0.6 04/11/2020 1109   BILITOT 0.5 02/24/2019 1224   BILITOT 0.44 03/11/2017 1524   GFRNONAA >60 01/11/2021 1434   GFRNONAA >60 02/24/2019 1224   GFRAA >60 01/15/2020 1457   GFRAA >60 02/24/2019 1224    INo results found for: SPEP, UPEP  Lab Results  Component Value Date   WBC 7.9 01/11/2021   NEUTROABS 5.6 01/11/2021   HGB 13.9 01/11/2021   HCT 41.3 01/11/2021   MCV 94.3 01/11/2021   PLT 255  01/11/2021      Chemistry      Component Value Date/Time   NA 142 01/11/2021 1434   NA 138 03/11/2017 1524   K 4.2 01/11/2021 1434   K 3.8 03/11/2017 1524   CL 107 01/11/2021 1434   CO2 24 01/11/2021 1434   CO2 25 03/11/2017 1524   BUN 16 01/11/2021 1434   BUN 13.2 03/11/2017 1524   CREATININE 0.87 01/11/2021 1434   CREATININE 0.83 02/24/2019 1224   CREATININE 0.8 03/11/2017 1524      Component Value Date/Time   CALCIUM 9.5 01/11/2021 1434   CALCIUM 9.6 03/11/2017 1524   ALKPHOS 82 01/11/2021 1434   ALKPHOS 166 (H) 03/11/2017  1524   AST 19 01/11/2021 1434   AST 19 02/24/2019 1224   AST 24 03/11/2017 1524   ALT 17 01/11/2021 1434   ALT 15 02/24/2019 1224   ALT 38 03/11/2017 1524   BILITOT 0.5 01/11/2021 1434   BILITOT 0.6 04/11/2020 1109   BILITOT 0.5 02/24/2019 1224   BILITOT 0.44 03/11/2017 1524       No results found for: LABCA2  No components found for: LABCA125  No results for input(s): INR in the last 168 hours.  Urinalysis    Component Value Date/Time   COLORURINE YELLOW 01/05/2021 1613   APPEARANCEUR CLOUDY (A) 01/05/2021 1613   LABSPEC 1.010 01/05/2021 1613   PHURINE 5.5 01/05/2021 1613   GLUCOSEU NEGATIVE 01/05/2021 1613   HGBUR TRACE (A) 01/05/2021 1613   BILIRUBINUR NEGATIVE 01/03/2017 1205   BILIRUBINUR n 10/01/2016 1316   KETONESUR NEGATIVE 01/05/2021 1613   PROTEINUR NEGATIVE 01/05/2021 1613   UROBILINOGEN 0.2 10/01/2016 1316   NITRITE NEGATIVE 01/03/2017 1205   LEUKOCYTESUR LARGE (A) 01/03/2017 1205    ELIGIBLE FOR AVAILABLE RESEARCH PROTOCOL: PALLAS   STUDIES: No results found.   ASSESSMENT: 75 y.o. [place] woman status post right breast upper outer quadrant and right axillary lymph node biopsy 08/23/2015, both positive for a clinical T1 N2, stage IIIA invasive ductal carcinoma, grade 2, strongly estrogen receptor positive, 5% progesterone receptor positive, with an MIB-120-30% and no HER-2 amplification  (a) biopsy of a 0.9 cm  Right breast satellite nodule 09/13/2015 showed invasive ductal carcinoma, grade 2, E-cadherin strongly positive, with a prognostic panel identical to the larger right upper quadrant mass  (1) left breast biopsy 08/24/2015 shows a complex sclerosing lesion, excised 12/05/2015  (2) neoadjuvant chemotherapy consisting of cyclophosphamide and docetaxel every 21 days 4, with OnPro support, started 09/12/2015, completed 11/14/2015  (3) status post right lumpectomy and sentinel lymph node dissection 12/05/2015 for a residual pT2 pN3, stage IIIC invasive ductal carcinoma, grade 2, with a close anterior margin (skin)  (4) adjuvant radiation 01/18/16 - 03/05/16 with capecitabine chemo-sensitization   1) Right breast/nodal areas: 45 Gy in 25 fractions                         2) Right breast boost: 16 Gy in 8 fractions  (5) capecitabine started 04/13/2016, discontinued January 2018 with mild/moderate side effects developing  (6) anastrozole started 06/01/2016  (a) bone density January 30, 2017 shows a T score of -2.7 (osteoporosis)  (7) genetics testing (Askenazi descent) 02/07/2016 through the Breast/Ovarian Cancer Panel offered by [facility] Hope Pigeon, MD) i found no deleterious mutations in  ATM, BARD1, BRCA1, BRCA2, BRIP1, CDH1, CHEK2, FANCC, MLH1, MSH2, MSH6, NBN, PALB2, PMS2, PTEN, RAD51C, RAD51D, TP53, and XRCC2.  This panel also includes deletion/duplication analysis (without sequencing) for one gene, EPCAM.  (8) consented to PALLAS trial   (a) randomized to anastrozole alone   PLAN: [name] is now a little over 5 years out from definitive surgery for her breast cancer with no evidence of disease recurrence.  This is very favorable.  She is continuing on anastrozole with no significant side effects.  She will see [name] again as part of the follow-up protocol early next year  Again we discussed grief issues and I plans for the coming year as she adjusts to Jerry's recent death.  Total  encounter time 25 minutes.   Jocilynn Grade, Virgie Dad, MD  01/12/21 8:47 PM Medical Oncology and Hematology [facility] [address]  [place], [place] [address] Tel. [phone]    Fax. [phone]   I, Wilburn Mylar, am acting as scribe for Dr. Virgie Dad. Cabria Micalizzi.  I, Lurline Del MD, have reviewed the above documentation for accuracy and completeness, and I agree with the above.   *Total Encounter Time as defined by the Centers for Medicare and Medicaid Services includes, in addition to the face-to-face time of a patient visit (documented in the note above) non-face-to-face time: obtaining and reviewing outside history, ordering and reviewing medications, tests or procedures, care coordination (communications with other health care professionals or caregivers) and documentation in the medical record.

## 2021-01-11 NOTE — Research (Signed)
PALLAS: PALBOCICLIB COLLABORATIVE ADJUVANT STUDY: A RANDOMIZED PHASE III TRIAL OF PALBOCICLIB WITH STANDARD ADJUVANT ENDOCRINE THERAPY VERSUS STANDARD ADJUVANT ENDOCRINE THERAPY ALONE FOR HORMONE RECEPTOR POSITIVE (HR+)/HUMAN EPIDERMAL GROWTH FACTOR RECEPTOR 2 (HER2)-NEGATIVE EARLY BREAST CANCER   AFT-05 PALLAS - Month 54 follow-up visit Patient into clinic today unaccompanied for a visit with Dr. Jana Hakim.  Research nurse met with patient to collect data for this time point.  Emotional support was given to the pt due to the recent passing of her husband in July 2022.     Anti-cancer medications - Patient states she is continuing to take anastrozole without interruption; she is on no new anti-cancer medications.   Serious adverse events - Patient denies any serious health problems since her last visit.   Disease monitoring - See MD note.  Md examined the pt today including a breast exam, and he stated the pt has "no evidence of disease activity".    COVID-19 - The pt is status post Pfizer vaccination x 2 followed by 3 booster shots.  Patient updated the research nurse today that she received her 3rd COVID-19 booster vaccine on 01/06/21 because she is considered high risk.     The patient is aware that her next research visit- month 36 is due in March 2022.  She will see Dr. Lindi Adie at her next visit because Dr. Jana Hakim is retiring.  The pt was informed that her month 60/ year 5 research samples will be drawn at this visit.  The pt verbalized understanding.  The research staff will see the pt at this visit.  The patient was thanked for her participation in this research study.  Brion Aliment RN, BSN, CCRP Clinical Research Nurse Lead 01/11/2021 4:51 PM

## 2021-01-12 ENCOUNTER — Encounter: Payer: Self-pay | Admitting: Oncology

## 2021-01-12 ENCOUNTER — Other Ambulatory Visit: Payer: Self-pay | Admitting: *Deleted

## 2021-01-12 MED ORDER — CEPHALEXIN 500 MG PO CAPS: 500.0000 mg | ORAL_CAPSULE | Freq: Two times a day (BID) | ORAL | 0 refills | Status: DC

## 2021-01-12 MED ORDER — ANASTROZOLE 1 MG PO TABS: 1.0000 mg | ORAL_TABLET | Freq: Every day | ORAL | 4 refills | Status: DC

## 2021-01-13 ENCOUNTER — Other Ambulatory Visit: Payer: Self-pay | Admitting: *Deleted

## 2021-01-13 DIAGNOSIS — C50411 Malignant neoplasm of upper-outer quadrant of right female breast: Secondary | ICD-10-CM

## 2021-01-13 DIAGNOSIS — Z170 Estrogen receptor positive status [ER+]: Secondary | ICD-10-CM

## 2021-01-28 ENCOUNTER — Other Ambulatory Visit: Payer: Self-pay | Admitting: Interventional Cardiology

## 2021-01-30 ENCOUNTER — Telehealth: Payer: Self-pay | Admitting: Physical Medicine and Rehabilitation

## 2021-01-30 NOTE — Telephone Encounter (Signed)
Patient called. She would like an appointment with Dr. Newton.  

## 2021-01-30 NOTE — Telephone Encounter (Signed)
Bilateral L4 TF on 11/23/20. Ok to repeat if helped, same problem/side, and no new injury?

## 2021-01-31 ENCOUNTER — Telehealth: Payer: Self-pay | Admitting: Physical Medicine and Rehabilitation

## 2021-01-31 NOTE — Telephone Encounter (Signed)
Pt called stating she missed a call from [place] and was just returning it.  [phone]

## 2021-01-31 NOTE — Telephone Encounter (Signed)
Pt called stating she would be free for a CB from now until about 3.

## 2021-01-31 NOTE — Telephone Encounter (Signed)
Left message #1

## 2021-02-01 ENCOUNTER — Telehealth: Payer: Self-pay | Admitting: Physical Medicine and Rehabilitation

## 2021-02-01 NOTE — Telephone Encounter (Signed)
Pt called stating she has been in a phone tag with courtney trying to schedule her epidural. Pt will be free at 1 and if courtney can call her at that time she will for sure answer.   [phone]

## 2021-02-01 NOTE — Telephone Encounter (Signed)
See previous message

## 2021-02-01 NOTE — Telephone Encounter (Signed)
Left message #2

## 2021-02-01 NOTE — Telephone Encounter (Signed)
Patient states that the injection helped some but not as much as previous injections. She cannot say how much it helped. Scheduled for OV with Megan to discuss.

## 2021-02-03 ENCOUNTER — Ambulatory Visit (INDEPENDENT_AMBULATORY_CARE_PROVIDER_SITE_OTHER): Payer: Medicare Other | Admitting: Physical Medicine and Rehabilitation

## 2021-02-03 ENCOUNTER — Encounter: Payer: Self-pay | Admitting: Physical Medicine and Rehabilitation

## 2021-02-03 ENCOUNTER — Other Ambulatory Visit: Payer: Self-pay

## 2021-02-03 VITALS — BP 140/87 | HR 70

## 2021-02-03 DIAGNOSIS — M48061 Spinal stenosis, lumbar region without neurogenic claudication: Secondary | ICD-10-CM | POA: Diagnosis not present

## 2021-02-03 DIAGNOSIS — M797 Fibromyalgia: Secondary | ICD-10-CM

## 2021-02-03 DIAGNOSIS — M5416 Radiculopathy, lumbar region: Secondary | ICD-10-CM

## 2021-02-03 DIAGNOSIS — M48062 Spinal stenosis, lumbar region with neurogenic claudication: Secondary | ICD-10-CM | POA: Diagnosis not present

## 2021-02-03 DIAGNOSIS — G894 Chronic pain syndrome: Secondary | ICD-10-CM

## 2021-02-03 NOTE — Progress Notes (Signed)
[name] - 75 y.o. female MRN [id number]  Date of birth: 07-21-1945  Office Visit Note: Visit Date: 02/03/2021 PCP: Seward Carol, MD Referred by: Seward Carol, MD  Subjective: Chief Complaint  Patient presents with   Lower Back - Pain   HPI: [name] is a 75 y.o. female who comes in today For evaluation of chronic, worsening and severe bilateral lower back pain radiating down bilateral legs to ankles.  Patient reports pain has been ongoing for several years, however has worsened in the last several months since the death of her husband.  Patient states pain is exacerbated by walking and prolonged standing, describes as soreness sensation and currently rates as 8 out of 10.  Patient states some relief of pain with rest and stretching exercises at home.  Patient's recent lumbar MRI exhibits multilevel listhesis and facet degeneration, and mild to moderate spinal canal stenosis at L4-L5.  Patient received bilateral L4 transforaminal epidural steroid injection on 11/23/2020 which she reports gave her greater than 50% relief for approximately 2 months.  Patient states she feels like she gets some relief with epidural steroid injections, however it is short-lived.  Patient did attend formal physical therapy at [facility] several years ago, which she reports did help to alleviate her pain.  Patient states she has been dealing with a lot lately, states she is in the middle of the grieving process and is also trying to sort out her late husband's estate.  Patient states she is under a tremendous amount of stress and does feel like this can contribute to her overall health.  Patient states she is seeing a therapist weekly at [facility] whom is working with her to help her cope with grieving process.  Patient denies focal weakness, numbness and tingling.  Patient denies recent trauma or fall.  Patient's course is complicated by extensive cardiac history, fibromyalgia  and recent issues with increased stress and depression.  Review of Systems  Musculoskeletal:  Positive for back pain and myalgias.  Neurological:  Negative for tingling, sensory change, focal weakness and weakness.  All other systems reviewed and are negative. Otherwise per HPI.  Assessment & Plan: Visit Diagnoses:    ICD-10-CM   1. Lumbar radiculopathy  M54.16     2. Spinal stenosis of lumbar region with neurogenic claudication  M48.062     3. Bilateral stenosis of lateral recess of lumbar spine  M48.061     4. Fibromyalgia  M79.7     5. Chronic pain syndrome  G89.4        Plan: Findings:  Chronic, worsening and severe bilateral lower back pain radiating down lateral legs to the ankles.  Patient did get some relief with last bilateral L4 transforaminal injection in July, however pain relief was short-lived. We did discuss patient's recent lumbar MRI with her today using images and spine model. Patient's clinical presentation and exam are consistent with classic L5 nerve pattern.  We also feel that patient's fibromyalgia and increased stress could be working to exacerbate her pain.  We feel that the next step would be to perform a diagnostic and hopefully therapeutic bilateral L5 transforaminal epidural steroid injection under fluoroscopic guidance.  We also spoke with patient about regrouping with physical therapy if her pain continues after epidural steroid injection.  Consider electrodiagnostic studies through neurology if just not getting much relief.  We will hold off on placing referral for in-house physical therapy at this time until we can re-evaluate  her pain post injection.  Patient encouraged to continue going to counseling to work through her grief, also encouraged to stay active and to continue performing stretching exercises at home.  No red flag symptoms noted today upon exam.   Meds & Orders: No orders of the defined types were placed in this encounter.  No orders of the  defined types were placed in this encounter.   Follow-up: Return for Bilateral L5 transforaminal epidural steroid injection.   Procedures: No procedures performed      Clinical History: MRI LUMBAR SPINE WITHOUT CONTRAST   TECHNIQUE: Multiplanar, multisequence MR imaging of the lumbar spine was performed. No intravenous contrast was administered.   COMPARISON:  01/29/2014   FINDINGS: Segmentation:  5 lumbar type vertebrae   Alignment: Levoscoliosis. Grade 1 anterolisthesis at L4-5. Mild retrolisthesis at L1-2 and L2-3. Misalignment has progressed from 2015.   Vertebrae:  No fracture, evidence of discitis, or bone lesion.   Conus medullaris and cauda equina: Conus extends to the L1-2 level. Conus and cauda equina appear normal. 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   Disc levels:   T12- L1: Disc narrowing and ventral spurring.  No impingement   L1-L2: Rightward disc collapse with far-lateral bulging and ridging. Asymmetric right facet spurring. Moderate right foraminal narrowing. Noncompressive right subarticular recess stenosis.   L2-L3: Disc narrowing and bulging with retrolisthesis. Mild facet spurring. Mild to moderate left foraminal narrowing.   L3-L4: Disc narrowing and left eccentric bulging. Degenerative facet spurring asymmetric to the left. Mild left subarticular recess stenosis.   L4-L5: Facet osteoarthritis with spurring and anterolisthesis. Moderate disc space narrowing with mild bulging. Bilateral root sleeve cysts. Mild to moderate spinal stenosis with asymmetric left subarticular recess narrowing   L5-S1:Disc narrowing and minor bulging. Mild facet spurring. No neural compression.   IMPRESSION: 1. Diffuse disc and facet degeneration with scoliosis and multilevel listhesis, progressed from 2015. 2. Mild to moderate foraminal narrowing on the right at L1-2 and left at  L2-3. 3. Mild to moderate spinal stenosis at L4-5 with asymmetric left subarticular recess narrowing.     Electronically Signed   By: Monte Fantasia M.D.   On: 05/08/2020 07:15 ---- MRI LUMBAR SPINE WITHOUT CONTRAST  IMPRESSION: 1. Mild broad-based disc bulges throughout the lumbar spine. 2. Mild bilateral facet arthropathy with mild bilateral lateral recess stenosis at L4-5.   Electronically Signed By: Kathreen Devoid On: 01/29/2014 12:05   She reports that she has quit smoking. Her smoking use included cigarettes. She has a 3.75 pack-year smoking history. She has never used smokeless tobacco. No results for input(s): HGBA1C, LABURIC in the last 8760 hours.  Objective:  VS:  HT:    WT:   BMI:     BP:140/87  HR:70bpm  TEMP: ( )  RESP:  Physical Exam Vitals and nursing note reviewed.  HENT:     Head: Normocephalic and atraumatic.     Right Ear: External ear normal.     Left Ear: External ear normal.     Nose: Nose normal.     Mouth/Throat:     Mouth: Mucous membranes are moist.  Eyes:     Extraocular Movements: Extraocular movements intact.  Cardiovascular:     Rate and Rhythm: Normal rate.     Pulses: Normal pulses.  Pulmonary:     Effort: Pulmonary effort is normal.  Abdominal:     General: Abdomen is flat. There  is no distension.  Musculoskeletal:        General: Tenderness present.     Cervical back: Normal range of motion.     Comments: Pt is slow to rise from seated position to standing. Good lumbar range of motion. Strong distal strength without clonus, no pain upon palpation of greater trochanters. Sensation intact bilaterally. Dysesthesias noted to bilateral L5 dermatome. Walks independently, gait slow.    Skin:    General: Skin is warm and dry.     Capillary Refill: Capillary refill takes less than 2 seconds.  Neurological:     General: No focal deficit present.     Mental Status: She is alert and oriented to person, place, and time.  Psychiatric:         Mood and Affect: Mood normal.    Ortho Exam  Imaging: No results found.  Past Medical/Family/Surgical/Social History: Medications & Allergies reviewed per EMR, new medications updated. 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   Status post coronary artery stent placement    Non-ST elevation (NSTEMI) myocardial infarction [facility])    Depression with anxiety 12/31/2016   Chest pain 12/31/2016   Hyponatremia 12/31/2016   Chronic pain 12/31/2016   Essential hypertension 12/31/2016   Unstable angina ([place])    Family history of breast cancer in female 02/08/2016   Rash and nonspecific skin eruption 10/03/2015   Sensorineural hearing loss (SNHL) of both ears 09/05/2015   Malignant neoplasm of upper-outer quadrant of right breast in female, estrogen receptor positive ([place]) 09/01/2015   Sclerosing adenosis of left breast 09/01/2015   Asthma 09/12/2011   Menopausal symptoms    Chronic vaginitis    Fibromyalgia    Interstitial cystitis    UNSPECIFIED HYPOTHYROIDISM 06/14/2009   DEPRESSIVE DISORDER NOT ELSEWHERE CLASSIFIED 06/14/2009   Hereditary and idiopathic peripheral neuropathy 06/14/2009   UNSPEC HEMORRHOIDS WITHOUT MENTION COMPLICATION 97/67/3419   ALLERGIC RHINITIS CAUSE UNSPECIFIED 06/14/2009   ESOPHAGEAL REFLUX 06/14/2009   Irritable bowel syndrome 06/14/2009   INSOMNIA UNSPECIFIED 06/14/2009   Osteoporosis 08/27/2007   Past Medical History:  Diagnosis Date   Anxiety    Arthritis 2008   Asthma 1953   age 89   Breast cancer ([place]) dx'd 2017   right   Cancer ([place])    breast cancer   Chronic vaginitis 1998   Fibromyalgia 1982   GERD  (gastroesophageal reflux disease)    since at least 2011   Hemorrhoids 2009   History of radiation therapy 01/18/16-03/05/16   right breast/nodal areas 45 Gy, right breast boost 16 Gy   Hypoglycemia 2003   Hypothyroidism 1964   age 57   Interstitial cystitis 1998   Irritable bowel syndrome 06/14/2009   Qualifier: Diagnosis of  By: Patsy Baltimore RN, Langley Gauss     Menopausal symptoms    Osteoporosis 12/24/2006   Personal history of chemotherapy    Personal history of radiation therapy    Pneumonia 1979   varicella   PONV (postoperative nausea and vomiting) 1979   Family History  Problem Relation Age of Onset   Hypertension Mother    Heart disease Mother        d. 84   Parkinsonism Father  d. 44   Heart disease Sister    Breast cancer Sister 76       s/p mastectomy; reportedly negative BRCA1/2 testing approx 7 years ago   Melanoma Sister        d. 46s; hx of incresed sun exposure   Cancer Sister        dx. sarcoma of her face in her 46s; treated w/ MOHS surgery   Parkinsonism Paternal Aunt        d. 90s   Parkinsonism Paternal Uncle        d. late 64s   Kidney failure Maternal Grandmother 46   Heart attack Maternal Grandfather        d. 58s   Heart disease Paternal Grandmother        d. 26   Breast cancer Other 62       maternal great aunt (MGF's sister)   Kidney cancer Paternal Uncle 93       d. 22s; unsure about smoking status   Past Surgical History:  Procedure Laterality Date   BLADDER HYPEREXTENSION     X4, prior to 01/2006   BLADDER SUSPENSION     BREAST BIOPSY     BREAST LUMPECTOMY Right    2017   BREAST LUMPECTOMY WITH NEEDLE LOCALIZATION AND AXILLARY LYMPH NODE DISSECTION Right 12/05/2015   Procedure: RIGHT BREAST NEEDLE LOCALIZED (WIRES X 2) LUMPECTOMY WITH AXILLARY LYMPH NODE DISSECTION;  Surgeon: Fanny Skates, MD;  Location: [place];  Service: General;  Laterality: Right;   BREAST LUMPECTOMY WITH RADIOACTIVE SEED LOCALIZATION Left 12/05/2015   Procedure:  RADIOACTIVE SEED GUIDED LEFT BREAST LUMPECTOMY;  Surgeon: Fanny Skates, MD;  Location: [place];  Service: General;  Laterality: Left;   BREAST SURGERY     BUNIONECTOMY     prior to 01/2006   [place]  02/08/2006   DR. INGRAM   COLONOSCOPY     CORONARY STENT INTERVENTION N/A 01/01/2017   Procedure: CORONARY STENT INTERVENTION;  Surgeon: Jettie Booze, MD;  Location: [place] [facility];  Service: Cardiovascular;  Laterality: N/A;   DENTAL SURGERY  2009-2016   13 dental implants   LEFT HEART CATH AND CORONARY ANGIOGRAPHY N/A 01/01/2017   Procedure: LEFT HEART CATH AND CORONARY ANGIOGRAPHY;  Surgeon: Jettie Booze, MD;  Location: [place] [facility];  Service: Cardiovascular;  Laterality: N/A;   LEG SURGERY  07/06/2004   Broken tibia   NASAL SINUS SURGERY     X4; prior to 01/2006   [facility] REMOVAL Right 12/05/2015   Procedure: REMOVAL PORT-A-CATH;  Surgeon: Fanny Skates, MD;  Location: [place];  Service: General;  Laterality: Right;   PORTACATH PLACEMENT Left 09/08/2015   Procedure: INSERTION OF PORT-A-CATH ;  Surgeon: Fanny Skates, MD;  Location: [place];  Service: General;  Laterality: Left;   Social History   Occupational History   Not on file  Tobacco Use   Smoking status: Former    Packs/day: 0.25    Years: 15.00    Pack years: 3.75    Types: Cigarettes   Smokeless tobacco: Never   Tobacco comments:    social smoker/"on and off"  Vaping Use   Vaping Use: Never used  Substance and Sexual Activity   Alcohol use: Yes    Alcohol/week: 0.0 standard drinks    Comment: social   Drug use: No   Sexual activity: Not Currently    Birth control/protection: Post-menopausal

## 2021-02-03 NOTE — Progress Notes (Signed)
Pt state lower back pain that travels down both legs. Pt state when getting up out of bed or a sitting position the pain gets worse. Pt state walking makes the pain worse. Pt state she uses heat and pain meds to help ease her pain. Pt has hx of inj on 11/23/20 pt state it helped but not as good has the one in April of this year.  Numeric Pain Rating Scale and Functional Assessment Average Pain 9 Pain Right Now 6 My pain is intermittent, burning, tingling, and aching Pain is worse with: sitting, standing, and laying in bed Pain improves with: heat/ice and medication   In the last MONTH (on 0-10 scale) has pain interfered with the following?  1. General activity like being  able to carry out your everyday physical activities such as walking, climbing stairs, carrying groceries, or moving a chair?  Rating(7)  2. Relation with others like being able to carry out your usual social activities and roles such as  activities at home, at work and in your community. Rating(8)  3. Enjoyment of life such that you have  been bothered by emotional problems such as feeling anxious, depressed or irritable?  Rating(9)

## 2021-02-05 ENCOUNTER — Encounter: Payer: Self-pay | Admitting: Physical Medicine and Rehabilitation

## 2021-02-09 ENCOUNTER — Ambulatory Visit (INDEPENDENT_AMBULATORY_CARE_PROVIDER_SITE_OTHER): Payer: Medicare Other | Admitting: Physical Medicine and Rehabilitation

## 2021-02-09 ENCOUNTER — Encounter: Payer: Self-pay | Admitting: Physical Medicine and Rehabilitation

## 2021-02-09 ENCOUNTER — Ambulatory Visit: Payer: Self-pay

## 2021-02-09 ENCOUNTER — Other Ambulatory Visit: Payer: Self-pay

## 2021-02-09 VITALS — BP 144/76 | HR 79

## 2021-02-09 DIAGNOSIS — M5416 Radiculopathy, lumbar region: Secondary | ICD-10-CM

## 2021-02-09 MED ORDER — METHYLPREDNISOLONE ACETATE 80 MG/ML IJ SUSP
80.0000 mg | Freq: Once | INTRAMUSCULAR | Status: AC
  Administered 2021-02-09: 80 mg

## 2021-02-09 NOTE — Progress Notes (Signed)
Pt state lower back pain that travels down both legs. Pt state when getting up out of bed or a sitting position the pain gets worse. Pt state walking makes the pain worse. Pt state she uses heat and pain meds to help ease her pain. Pt has hx of inj on 11/23/20 pt state it helped a little bit.   Numeric Pain Rating Scale and Functional Assessment Average Pain 7   In the last MONTH (on 0-10 scale) has pain interfered with the following?  1. General activity like being  able to carry out your everyday physical activities such as walking, climbing stairs, carrying groceries, or moving a chair?  Rating(8)   +Driver, -BT, -Dye Allergies.

## 2021-02-09 NOTE — Patient Instructions (Signed)

## 2021-02-13 ENCOUNTER — Other Ambulatory Visit: Payer: Self-pay | Admitting: *Deleted

## 2021-02-13 MED ORDER — GABAPENTIN 300 MG PO CAPS: ORAL_CAPSULE | ORAL | 4 refills | Status: DC

## 2021-02-16 ENCOUNTER — Ambulatory Visit
Admission: RE | Admit: 2021-02-16 | Discharge: 2021-02-16 | Disposition: A | Discharge status: Home / Self Care | Payer: Medicare Other | Source: Ambulatory Visit | Attending: Oncology | Admitting: Oncology

## 2021-02-16 ENCOUNTER — Other Ambulatory Visit: Payer: Self-pay

## 2021-02-16 DIAGNOSIS — Z9889 Other specified postprocedural states: Secondary | ICD-10-CM

## 2021-02-21 ENCOUNTER — Encounter: Payer: Self-pay | Admitting: Oncology

## 2021-02-23 NOTE — Procedures (Signed)
Lumbosacral Transforaminal Epidural Steroid Injection - Sub-Pedicular Approach with Fluoroscopic Guidance  Patient: [name]      Date of Birth: 1945/12/01 MRN: [id number] PCP: Seward Carol, MD      Visit Date: 02/09/2021   Universal Protocol:    Date/Time: 02/09/2021  Consent Given By: the patient  Position: PRONE  Additional Comments: Vital signs were monitored before and after the procedure. Patient was prepped and draped in the usual sterile fashion. The correct patient, procedure, and site was verified.   Injection Procedure Details:   Procedure diagnoses: Lumbar radiculopathy [M54.16]    Meds Administered:  Meds ordered this encounter  Medications   methylPREDNISolone acetate (DEPO-MEDROL) injection 80 mg    Laterality: Bilateral  Location/Site: L5  Needle:5.0 in., 22 ga.  Short bevel or Quincke spinal needle  Needle Placement: Transforaminal  Findings:    -Comments: Excellent flow of contrast along the nerve, nerve root and into the epidural space.  Procedure Details: After squaring off the end-plates to get a true AP view, the C-arm was positioned so that an oblique view of the foramen as noted above was visualized. The target area is just inferior to the "nose of the scotty dog" or sub pedicular. The soft tissues overlying this structure were infiltrated with 2-3 ml. of 1% Lidocaine without Epinephrine.  The spinal needle was inserted toward the target using a "trajectory" view along the fluoroscope beam.  Under AP and lateral visualization, the needle was advanced so it did not puncture dura and was located close the 6 O'Clock position of the pedical in AP tracterory. Biplanar projections were used to confirm position. Aspiration was confirmed to be negative for CSF and/or blood. A 1-2 ml. volume of Isovue-250 was injected and flow of contrast was noted at each level. Radiographs were obtained for documentation purposes.   After attaining the desired  flow of contrast documented above, a 0.5 to 1.0 ml test dose of 0.25% Marcaine was injected into each respective transforaminal space.  The patient was observed for 90 seconds post injection.  After no sensory deficits were reported, and normal lower extremity motor function was noted,   the above injectate was administered so that equal amounts of the injectate were placed at each foramen (level) into the transforaminal epidural space.   Additional Comments:  The patient tolerated the procedure well Dressing: 2 x 2 sterile gauze and Band-Aid    Post-procedure details: Patient was observed during the procedure. Post-procedure instructions were reviewed.  Patient left the clinic in stable condition.

## 2021-02-23 NOTE — Progress Notes (Signed)
[name] - 75 y.o. female MRN [id number]  Date of birth: [birth year]  Office Visit Note: Visit Date: 02/09/2021 PCP: Seward Carol, MD Referred by: Seward Carol, MD  Subjective: Chief Complaint  Patient presents with   Lower Back - Pain   Left Leg - Pain   Right Leg - Pain   HPI:  [name] is a 75 y.o. female who comes in today for planned Bilateral L5-S1 Lumbar Transforaminal epidural steroid injection with fluoroscopic guidance.  The patient has failed conservative care including home exercise, medications, time and activity modification.  This injection will be diagnostic and hopefully therapeutic.  Please see requesting physician notes for further details and justification.   ROS Otherwise per HPI.  Assessment & Plan: Visit Diagnoses:    ICD-10-CM   1. Lumbar radiculopathy  M54.16 XR C-ARM NO REPORT    Epidural Steroid injection    methylPREDNISolone acetate (DEPO-MEDROL) injection 80 mg      Plan: No additional findings.   Meds & Orders:  Meds ordered this encounter  Medications   methylPREDNISolone acetate (DEPO-MEDROL) injection 80 mg    Orders Placed This Encounter  Procedures   XR C-ARM NO REPORT   Epidural Steroid injection    Follow-up: Return if symptoms worsen or fail to improve.   Procedures: No procedures performed  Lumbosacral Transforaminal Epidural Steroid Injection - Sub-Pedicular Approach with Fluoroscopic Guidance  Patient: [name]      Date of Birth: 1945-09-21 MRN: [id number] PCP: Seward Carol, MD      Visit Date: 02/09/2021   Universal Protocol:    Date/Time: 02/09/2021  Consent Given By: the patient  Position: PRONE  Additional Comments: Vital signs were monitored before and after the procedure. Patient was prepped and draped in the usual sterile fashion. The correct patient, procedure, and site was verified.   Injection Procedure Details:   Procedure diagnoses: Lumbar radiculopathy [M54.16]    Meds  Administered:  Meds ordered this encounter  Medications   methylPREDNISolone acetate (DEPO-MEDROL) injection 80 mg    Laterality: Bilateral  Location/Site: L5  Needle:5.0 in., 22 ga.  Short bevel or Quincke spinal needle  Needle Placement: Transforaminal  Findings:    -Comments: Excellent flow of contrast along the nerve, nerve root and into the epidural space.  Procedure Details: After squaring off the end-plates to get a true AP view, the C-arm was positioned so that an oblique view of the foramen as noted above was visualized. The target area is just inferior to the "nose of the scotty dog" or sub pedicular. The soft tissues overlying this structure were infiltrated with 2-3 ml. of 1% Lidocaine without Epinephrine.  The spinal needle was inserted toward the target using a "trajectory" view along the fluoroscope beam.  Under AP and lateral visualization, the needle was advanced so it did not puncture dura and was located close the 6 O'Clock position of the pedical in AP tracterory. Biplanar projections were used to confirm position. Aspiration was confirmed to be negative for CSF and/or blood. A 1-2 ml. volume of Isovue-250 was injected and flow of contrast was noted at each level. Radiographs were obtained for documentation purposes.   After attaining the desired flow of contrast documented above, a 0.5 to 1.0 ml test dose of 0.25% Marcaine was injected into each respective transforaminal space.  The patient was observed for 90 seconds post injection.  After no sensory deficits were reported, and normal lower extremity motor function was noted,   the  above injectate was administered so that equal amounts of the injectate were placed at each foramen (level) into the transforaminal epidural space.   Additional Comments:  The patient tolerated the procedure well Dressing: 2 x 2 sterile gauze and Band-Aid    Post-procedure details: Patient was observed during the  procedure. Post-procedure instructions were reviewed.  Patient left the clinic in stable condition.    Clinical History: MRI LUMBAR SPINE WITHOUT CONTRAST   TECHNIQUE: Multiplanar, multisequence MR imaging of the lumbar spine was performed. No intravenous contrast was administered.   COMPARISON:  01/29/2014   FINDINGS: Segmentation:  5 lumbar type vertebrae   Alignment: Levoscoliosis. Grade 1 anterolisthesis at L4-5. Mild retrolisthesis at L1-2 and L2-3. Misalignment has progressed from 2015.   Vertebrae:  No fracture, evidence of discitis, or bone lesion.   Conus medullaris and cauda equina: Conus extends to the L1-2 level. Conus and cauda equina appear normal. 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   Disc levels:   T12- L1: Disc narrowing and ventral spurring.  No impingement   L1-L2: Rightward disc collapse with far-lateral bulging and ridging. Asymmetric right facet spurring. Moderate right foraminal narrowing. Noncompressive right subarticular recess stenosis.   L2-L3: Disc narrowing and bulging with retrolisthesis. Mild facet spurring. Mild to moderate left foraminal narrowing.   L3-L4: Disc narrowing and left eccentric bulging. Degenerative facet spurring asymmetric to the left. Mild left subarticular recess stenosis.   L4-L5: Facet osteoarthritis with spurring and anterolisthesis. Moderate disc space narrowing with mild bulging. Bilateral root sleeve cysts. Mild to moderate spinal stenosis with asymmetric left subarticular recess narrowing   L5-S1:Disc narrowing and minor bulging. Mild facet spurring. No neural compression.   IMPRESSION: 1. Diffuse disc and facet degeneration with scoliosis and multilevel listhesis, progressed from 2015. 2. Mild to moderate foraminal narrowing on the right at L1-2 and left at L2-3. 3. Mild to moderate spinal stenosis at L4-5 with asymmetric  left subarticular recess narrowing.     Electronically Signed   By: Monte Fantasia M.D.   On: 05/08/2020 07:15 ---- MRI LUMBAR SPINE WITHOUT CONTRAST  IMPRESSION: 1. Mild broad-based disc bulges throughout the lumbar spine. 2. Mild bilateral facet arthropathy with mild bilateral lateral recess stenosis at L4-5.   Electronically Signed By: Kathreen Devoid On: 01/29/2014 12:05     Objective:  VS:  HT:    WT:   BMI:     BP:(!) 144/76  HR:79bpm  TEMP: ( )  RESP:  Physical Exam Vitals and nursing note reviewed.  Constitutional:      General: She is not in acute distress.    Appearance: Normal appearance. She is not ill-appearing.  HENT:     Head: Normocephalic and atraumatic.     Right Ear: External ear normal.     Left Ear: External ear normal.  Eyes:     Extraocular Movements: Extraocular movements intact.  Cardiovascular:     Rate and Rhythm: Normal rate.     Pulses: Normal pulses.  Pulmonary:     Effort: Pulmonary effort is normal. No respiratory distress.  Abdominal:     General: There is no distension.     Palpations: Abdomen is soft.  Musculoskeletal:        General: Tenderness present.     Cervical back: Neck supple.     Right lower leg: No edema.     Left lower leg: No edema.  Comments: Patient has good distal strength with no pain over the greater trochanters.  No clonus or focal weakness.  Skin:    Findings: No erythema, lesion or rash.  Neurological:     General: No focal deficit present.     Mental Status: She is alert and oriented to person, place, and time.     Sensory: No sensory deficit.     Motor: No weakness or abnormal muscle tone.     Coordination: Coordination normal.  Psychiatric:        Mood and Affect: Mood normal.        Behavior: Behavior normal.     Imaging: No results found.

## 2021-03-14 ENCOUNTER — Other Ambulatory Visit: Payer: Self-pay

## 2021-03-14 ENCOUNTER — Telehealth: Payer: Self-pay | Admitting: Interventional Cardiology

## 2021-03-14 MED ORDER — NITROGLYCERIN 0.4 MG SL SUBL: SUBLINGUAL_TABLET | SUBLINGUAL | 1 refills | Status: DC

## 2021-03-14 NOTE — Telephone Encounter (Signed)
[facility] is calling back to check on the status of this clearance due to not hearing back yet and the patient being scheduled for tomorrow.

## 2021-03-14 NOTE — Telephone Encounter (Signed)
Notes faxed to surgeon. This phone note will be removed from the preop pool. Richardson Dopp, PA-C  03/14/2021 1:23 PM

## 2021-03-14 NOTE — Telephone Encounter (Signed)
   Name: [name] DOB: 07/26/1945  MRN: [id number]  Primary Cardiologist: Larae Grooms, MD  Chart reviewed as part of pre-operative protocol coverage.   75 year old female with: Coronary artery disease S/p non-STEMI 12/2016 treated with DES to the LCx, LAD and RCA x2 Myoview 7/22: Low risk (HFpEF) heart failure with preserved ejection fraction  Hypertension  Hyperlipidemia   She was last seen by Dr. Irish Lack in 01/2020.  She called in with chest pain around the time her husband passed away in 21-Nov-2020 and was set up for a stress test.  This was low risk.  Patient was contacted 03/14/2021 in reference to pre-operative risk assessment for pending surgery as outlined below.    Since last seen, [name] has done well without chest pain, shortness of breath, syncope.  She did have some indigestion last night that she thinks is related to the prep for her colonoscopy as well as anxiety.  She did take Protonix and NTG with relief.  I suspect her symptoms were GI related, especially with a recent low risk stress test, which is reassuring. She knows to contact us and her GI if she starts having more and more of these symptoms.    I did review her antiplatelet Rx with Dr. Irish Lack.  Her PCI was 4 years ago.  Therefore, she can hold her Brilinta for the procedure.    Recommendations: Based on ACC/AHA guidelines, the patient is at acceptable risk for the planned procedure and may proceed without further cardiovascular testing.  Procedure is scheduled for tomorrow.  It is ok to hold Ticagrelor (Brilinta) today for the procedure.  It should be resumed post op when felt to be safe.     Please call with questions. Richardson Dopp, PA-C 03/14/2021, 1:11 PM

## 2021-03-14 NOTE — Telephone Encounter (Signed)
   [facility] Pre-operative Risk Assessment    Patient Name: [name]  DOB: 08/01/1945 MRN: [id number]  HEARTCARE STAFF:  - IMPORTANT!!!!!! Under Visit Info/Reason for Call, type in Other and utilize the format Clearance MM/DD/YY or Clearance TBD. Do not use dashes or single digits. - Please review there is not already an duplicate clearance open for this procedure. - If request is for dental extraction, please clarify the # of teeth to be extracted. - If the patient is currently at the dentist's office, call Pre-Op Callback Staff (MA/nurse) to input urgent request.  - If the patient is not currently in the dentist office, please route to the Pre-Op pool.  Request for surgical clearance:  What type of surgery is being performed? Colonoscopy   When is this surgery scheduled? 03/15/21  What type of clearance is required (medical clearance vs. Pharmacy clearance to hold med vs. Both)? Medical & Pharmacy   Are there any medications that need to be held prior to surgery and how long? BRILINTA 90 MG TABS tablet   would like Dr. To advise of when to hold   Practice name and name of physician performing surgery? [facility]   What is the office phone number? [phone]   7.   What is the office fax number? [phone]  8.   Anesthesia type (None, local, MAC, general) ? propofol   Kamira J Martinique 03/14/2021, 9:18 AM  _________________________________________________________________   (provider comments below)

## 2021-03-27 ENCOUNTER — Other Ambulatory Visit: Payer: Self-pay | Admitting: Cardiology

## 2021-04-11 NOTE — Progress Notes (Signed)
Cardiology Office Note   Date:  04/13/2021   ID:  [name], DOB 1945-05-25, MRN [id number]  PCP:  Seward Carol, MD    No chief complaint on file.  CAD  Wt Readings from Last 3 Encounters:  04/13/21 152 lb 9.6 oz (69.2 kg)  01/11/21 147 lb 3.2 oz (66.8 kg)  11/09/20 151 lb (68.5 kg)       History of Present Illness: [name] is a 75 y.o. female  with history of CAD status post NSTEMI 01/01/17 treated with DES 4 to the circumflex, mid LAD, mid RCA, and distal RCA.  Angina was more like indigestion, followed by pain in the center of chest, up to the jaw.  She worked at [employer] many years ago and knows Dr. Velora Heckler.   "The left ventricular systolic function is normal. LV end diastolic pressure is normal. The left ventricular ejection fraction is 55-65% by visual estimate. There is no aortic valve stenosis. Prox Cx to Mid Cx lesion, 95 %stenosed. A STENT PROMUS PREM MR 2.75X16 drug eluting stent was successfully placed, postdilated to >20mm. Post intervention, there is a 0% residual stenosis. Mid LAD-2 lesion, 80 %stenosed. A STENT PROMUS PREM MR 6.29B28 drug eluting stent was successfully placed, postdilated to >46mm. Post intervention, there is a 0% residual stenosis. Mid RCA lesion, 90 %stenosed. A STENT PROMUS PREM MR 3.0X20 drug eluting stent was successfully placed, postdilated to 3.3 mm. Post intervention, there is a 0% residual stenosis. Dist RCA-2 lesion, 70 %stenosed. A STENT PROMUS PREM MR 2.5X12 drug eluting stent was successfully placed. Post intervention, there is a 10% residual stenosis."   Patient has history of hypertension, breast CA status post lumpectomy chemotherapy and radiation, anxiety, fibromyalgia and hypothyroidism.   Seen on 01/18/17 with shortness of breath and BNP was up to 415. She was given some Lasix to take for a week and breathing improved. She thought it could be asthma related also. 2-D echo 01/02/17 showed normal LVEF 60-65% with  grade 1 DD.She went to [place].   Crestor increased to 20 mg daily in 2019.    Since the last visit, her husband developed dementia.  She had a lot of stress due to increased responsibilities.     With stress, she has had palpitations and some chest tightness.  It resolves with relaxation.   No problems with walking distances.    Due to LDL 82, Crestor was increased to 40 mg daily.  She is tolerating this well.    Stress in 2022: "The left ventricular ejection fraction is hyperdynamic (>65%). Nuclear stress EF: 71%. There was no ST segment deviation noted during stress. The study is normal. This is a low risk study.   IMPRESSIONS Negative for stress induced arrhythmias.  Hyperdynamic left ventricular function. No perfusion defects.  There is subdiaphragmatic extra-cardiac activity in the stress and rest images.  Sensitivity is decreased with the presence this finding.   Raw images do not show evidence of non-cardiac thoracic uptake.   Conclusions: Stress test is negative. This is consistent with a low risk study ."  Husband passed away in 04-May-2021.  Denies : Chest pain. Dizziness. Leg edema. Nitroglycerin use. Orthopnea. Palpitations. Paroxysmal nocturnal dyspnea. Shortness of breath. Syncope.    Past Medical History:  Diagnosis Date   Anxiety    Arthritis 2008   Asthma 1953   age 77   Breast cancer ([place]) dx'd 2017   right   Cancer [facility])    breast cancer  Chronic vaginitis 1998   Fibromyalgia 1982   GERD (gastroesophageal reflux disease)    since at least 2011   Hemorrhoids 2009   History of radiation therapy 01/18/16-03/05/16   right breast/nodal areas 45 Gy, right breast boost 16 Gy   Hypoglycemia 2003   Hypothyroidism 1964   age 78   Interstitial cystitis 1998   Irritable bowel syndrome 06/14/2009   Qualifier: Diagnosis of  By: Patsy Baltimore RN, Denise     Menopausal symptoms    Osteoporosis 12/24/2006   Personal history of chemotherapy    Personal history of  radiation therapy    Pneumonia 1979   varicella   PONV (postoperative nausea and vomiting) 1979    Past Surgical History:  Procedure Laterality Date   BLADDER HYPEREXTENSION     X4, prior to 01/2006   [place] LUMPECTOMY Right    2017   BREAST LUMPECTOMY WITH NEEDLE LOCALIZATION AND AXILLARY LYMPH NODE DISSECTION Right 12/05/2015   Procedure: RIGHT BREAST NEEDLE LOCALIZED (WIRES X 2) LUMPECTOMY WITH AXILLARY LYMPH NODE DISSECTION;  Surgeon: Fanny Skates, MD;  Location: [place];  Service: General;  Laterality: Right;   BREAST LUMPECTOMY WITH RADIOACTIVE SEED LOCALIZATION Left 12/05/2015   Procedure: RADIOACTIVE SEED GUIDED LEFT BREAST LUMPECTOMY;  Surgeon: Fanny Skates, MD;  Location: [place];  Service: General;  Laterality: Left;   BREAST SURGERY     BUNIONECTOMY     prior to 01/2006   [place]  02/08/2006   DR. INGRAM   COLONOSCOPY     CORONARY STENT INTERVENTION N/A 01/01/2017   Procedure: CORONARY STENT INTERVENTION;  Surgeon: Jettie Booze, MD;  Location: [facility];  Service: Cardiovascular;  Laterality: N/A;   DENTAL SURGERY  2009-2016   13 dental implants   LEFT HEART CATH AND CORONARY ANGIOGRAPHY N/A 01/01/2017   Procedure: LEFT HEART CATH AND CORONARY ANGIOGRAPHY;  Surgeon: Jettie Booze, MD;  Location: [facility];  Service: Cardiovascular;  Laterality: N/A;   LEG SURGERY  07/06/2004   Broken tibia   NASAL SINUS SURGERY     X4; prior to 01/2006   [facility] REMOVAL Right 12/05/2015   Procedure: REMOVAL PORT-A-CATH;  Surgeon: Fanny Skates, MD;  Location: [facility];  Service: General;  Laterality: Right;   PORTACATH PLACEMENT Left 09/08/2015   Procedure: INSERTION OF PORT-A-CATH ;  Surgeon: Fanny Skates, MD;  Location: [place];  Service: General;  Laterality: Left;     Current Outpatient Medications  Medication Sig Dispense Refill   albuterol  (PROVENTIL HFA;VENTOLIN HFA) 108 (90 Base) MCG/ACT inhaler Inhale 1-2 puffs into the lungs every 6 (six) hours as needed for wheezing or shortness of breath. Started in 1998     anastrozole (ARIMIDEX) 1 MG tablet Take 1 tablet (1 mg total) by mouth daily. 90 tablet 4   buPROPion (WELLBUTRIN XL) 300 MG 24 hr tablet Take 300 mg by mouth daily.      Calcium Carbonate-Vitamin D (CALCIUM + D PO) Take 1 tablet by mouth at bedtime. Started approximately 1998     carisoprodol (SOMA) 350 MG tablet Take 350 mg by mouth 2 (two) times daily as needed for muscle spasms. Started in 2008.  2   cephALEXin (KEFLEX) 500 MG capsule Take 1 capsule (500 mg total) by mouth 2 (two) times daily. 20 capsule 0   diazepam (VALIUM) 5 MG tablet Take  5 mg by mouth every 6 (six) hours as needed for muscle spasms (interstitial cystitis). Started approximately 2008.     furosemide (LASIX) 20 MG tablet TAKE 1 TABLET(20 MG) BY MOUTH DAILY AS NEEDED FOR SHORTNESS OF BREATH 30 tablet 10   gabapentin (NEURONTIN) 300 MG capsule TAKE 3 TO 4 CAPSULES(900 TO 1200 MG) BY MOUTH AT BEDTIME 120 capsule 4   HYDROcodone-acetaminophen (NORCO) 10-325 MG tablet Take 1 tablet by mouth every 4 (four) hours as needed for moderate pain or severe pain (as needed for fibromyalgia and IC).      hydrOXYzine (ATARAX/VISTARIL) 25 MG tablet Take 1 tablet by mouth at bedtime.     levothyroxine (SYNTHROID, LEVOTHROID) 75 MCG tablet Take 75 mcg by mouth daily.  2   LORazepam (ATIVAN) 2 MG tablet Take 1 tablet by mouth daily as needed for anxiety.   5   nitroGLYCERIN (NITROSTAT) 0.4 MG SL tablet For chest pain, place 1 tablet under tongue and let dissolve, wait 5 mins if still having chest pain you may repeat this 2 more times. If still having chest pain call 911 or have someone drive you to the closest ER. 25 tablet 1   NONFORMULARY OR COMPOUNDED ITEM Vit E vaginal suppositories 200mg /ml.  One pv three times weekly.  #36/4RF 36 each 4   ondansetron (ZOFRAN) 4 MG  tablet Take 1-2 tablets (4-8 mg total) by mouth every 8 (eight) hours as needed for nausea or vomiting. 60 tablet 4   ondansetron (ZOFRAN) 8 MG tablet TAKE 1 TABLET EVERY 8 HOURS AS NEEDED FOR NAUSEA AND VOMITING. 20 tablet 1   pantoprazole (PROTONIX) 40 MG tablet Take 40 mg by mouth daily. Started in 2012.     potassium chloride (KLOR-CON) 10 MEQ tablet TAKE 1 TABLET BY MOUTH AS NEEDED. TAKE WITH LASIX 30 tablet 10   prochlorperazine (COMPAZINE) 10 MG tablet Take 1 tablet (10 mg total) by mouth every 6 (six) hours as needed for nausea or vomiting. 385 tablet 1   promethazine (PHENERGAN) 25 MG tablet Take 0.5-1 tablets (12.5-25 mg total) by mouth every 6 (six) hours as needed for nausea or vomiting. 90 tablet 4   rosuvastatin (CRESTOR) 40 MG tablet TAKE 1 TABLET(40 MG) BY MOUTH DAILY 90 tablet 3   ticagrelor (BRILINTA) 90 MG TABS tablet Take 1 tablet (90 mg total) by mouth 2 (two) times daily. Please keep upcoming appt in December 2022 with Dr. Irish Lack before anymore refills. Thank you 60 tablet 0   Vaginal Lubricant (REPLENS VA) Place vaginally daily.     Vitamin D, Ergocalciferol, (DRISDOL) 1.25 MG (50000 UNIT) CAPS capsule Take 1 capsule (50,000 Units total) by mouth every 7 (seven) days. 12 capsule 2   zolpidem (AMBIEN) 10 MG tablet Take 10 mg by mouth at bedtime as needed for sleep.     No current facility-administered medications for this visit.    Allergies:   Chlorhexidine and Other    Social History:  The patient  reports that she has quit smoking. Her smoking use included cigarettes. She has a 3.75 pack-year smoking history. She has never used smokeless tobacco. She reports current alcohol use. She reports that she does not use drugs.   Family History:  The patient's family history includes Breast cancer (age of onset: 66) in her sister; Breast cancer (age of onset: 17) in an other family member; Cancer in her sister; Heart attack in her maternal grandfather; Heart disease in her  mother, paternal grandmother, and sister; Hypertension in  her mother; Kidney cancer (age of onset: 53) in her paternal uncle; Kidney failure (age of onset: 21) in her maternal grandmother; Melanoma in her sister; Parkinsonism in her father, paternal aunt, and paternal uncle.    ROS:  Please see the history of present illness.   Otherwise, review of systems are positive for stress since her husband died.   All other systems are reviewed and negative.    PHYSICAL EXAM: VS:  BP (!) 154/76    Pulse 67    Ht 5\' 2"  (1.575 m)    Wt 152 lb 9.6 oz (69.2 kg)    LMP 04/30/1996 (Approximate)    SpO2 93%    BMI 27.91 kg/m  , BMI Body mass index is 27.91 kg/m. GEN: Well nourished, well developed, in no acute distress HEENT: normal Neck: no JVD, carotid bruits, or masses Cardiac: RRR; no murmurs, rubs, or gallops,no edema  Respiratory:  clear to auscultation bilaterally, normal work of breathing GI: soft, nontender, nondistended, + BS MS: no deformity or atrophy Skin: warm and dry, no rash Neuro:  Strength and sensation are intact Psych: euthymic mood, full affect   EKG:   The ekg ordered today demonstrates NSR, nonspecific ST changes   Recent Labs: 01/11/2021: ALT 17; BUN 16; Creatinine, Ser 0.87; Hemoglobin 13.9; Platelets 255; Potassium 4.2; Sodium 142   Lipid Panel    Component Value Date/Time   CHOL 169 04/11/2020 1109   TRIG 78 04/11/2020 1109   HDL 96 04/11/2020 1109   CHOLHDL 1.8 04/11/2020 1109   CHOLHDL 2.8 01/03/2017 0402   VLDL 17 01/03/2017 0402   LDLCALC 59 04/11/2020 1109     Other studies Reviewed: Additional studies/ records that were reviewed today with results demonstrating: LDL 78.   ASSESSMENT AND PLAN:  CAD/Old MI: Recent stress test was normal.   She was busy care giving which limited exercise.  She intends to get back to regular activity. HTN: Slightly high today.  Has a machine at home.  She should check at home.  She has actually been under a lot of stress  recently.  She will send [name] some home readings via MyChart. Chronic diastolic heart failure: Appears euvolemic.  Continue current medications. Hyperlipidemia: LDL 78 in 01/2021.  Continue high potency rosuvastatin.  Whole food, plant-based diet.   Current medicines are reviewed at length with the patient today.  The patient concerns regarding her medicines were addressed.  The following changes have been made:  No change  Labs/ tests ordered today include:  No orders of the defined types were placed in this encounter.   Recommend 150 minutes/week of aerobic exercise Low fat, low carb, high fiber diet recommended  Disposition:   FU in 1 year   Signed, Larae Grooms, MD  04/13/2021 4:26 PM    [facility] [address], [place], [place]  [address] Phone: [phone]; Fax: [phone]

## 2021-04-13 ENCOUNTER — Other Ambulatory Visit: Payer: Self-pay

## 2021-04-13 ENCOUNTER — Ambulatory Visit (INDEPENDENT_AMBULATORY_CARE_PROVIDER_SITE_OTHER): Payer: Medicare Other | Admitting: Interventional Cardiology

## 2021-04-13 ENCOUNTER — Encounter: Payer: Self-pay | Admitting: Interventional Cardiology

## 2021-04-13 VITALS — BP 154/76 | HR 67 | Ht 62.0 in | Wt 152.6 lb

## 2021-04-13 DIAGNOSIS — I252 Old myocardial infarction: Secondary | ICD-10-CM | POA: Diagnosis not present

## 2021-04-13 DIAGNOSIS — E782 Mixed hyperlipidemia: Secondary | ICD-10-CM | POA: Diagnosis not present

## 2021-04-13 DIAGNOSIS — I2510 Atherosclerotic heart disease of native coronary artery without angina pectoris: Secondary | ICD-10-CM

## 2021-04-13 DIAGNOSIS — I5032 Chronic diastolic (congestive) heart failure: Secondary | ICD-10-CM

## 2021-04-13 NOTE — Patient Instructions (Signed)

## 2021-04-28 ENCOUNTER — Other Ambulatory Visit: Payer: Self-pay | Admitting: Interventional Cardiology

## 2021-05-09 ENCOUNTER — Telehealth: Payer: Self-pay | Admitting: Physical Medicine and Rehabilitation

## 2021-05-09 NOTE — Telephone Encounter (Signed)
Pt called and would like to repeat BIlateral L5 TF. Last injection 02/06/2021. Seems to be helping   CB [phone]

## 2021-05-12 ENCOUNTER — Other Ambulatory Visit (HOSPITAL_BASED_OUTPATIENT_CLINIC_OR_DEPARTMENT_OTHER): Payer: Self-pay | Admitting: Obstetrics & Gynecology

## 2021-05-18 ENCOUNTER — Telehealth (HOSPITAL_BASED_OUTPATIENT_CLINIC_OR_DEPARTMENT_OTHER): Payer: Self-pay | Admitting: *Deleted

## 2021-05-18 DIAGNOSIS — E559 Vitamin D deficiency, unspecified: Secondary | ICD-10-CM

## 2021-05-18 NOTE — Telephone Encounter (Signed)
RX held until patient has bloodwork on 05/23/21 for Vit D to ensure need of Rx refill KW CMA/Dr Sabra Heck

## 2021-05-18 NOTE — Telephone Encounter (Signed)
TC to pt regarding Vit D refill at [facility].  Dr Sabra Heck reviewed chart.  No documented Vit D level.  Advised needs Vit D level drawn to ensure that Vit D refill is still needed.  Pt understood and lab apt is for 1/24 at [facility].  Told pt to check in on third floor and that we would have the results the next day.  I will let [facility] know to hold the refill.. Order placed. KW CMA

## 2021-05-23 ENCOUNTER — Telehealth (HOSPITAL_BASED_OUTPATIENT_CLINIC_OR_DEPARTMENT_OTHER): Payer: Self-pay | Admitting: Obstetrics & Gynecology

## 2021-05-23 ENCOUNTER — Other Ambulatory Visit (HOSPITAL_BASED_OUTPATIENT_CLINIC_OR_DEPARTMENT_OTHER): Payer: Medicare Other

## 2021-05-23 NOTE — Telephone Encounter (Signed)
Patient called and canceled her appointment today for labs .Patient would like to know if Dr.Miller can put a order in for for her to get her labs done at [facility] .Patient stated it would be closer for her to go.

## 2021-05-29 ENCOUNTER — Ambulatory Visit (INDEPENDENT_AMBULATORY_CARE_PROVIDER_SITE_OTHER): Payer: Medicare Other | Admitting: Physical Medicine and Rehabilitation

## 2021-05-29 ENCOUNTER — Encounter: Payer: Self-pay | Admitting: Physical Medicine and Rehabilitation

## 2021-05-29 ENCOUNTER — Other Ambulatory Visit: Payer: Self-pay

## 2021-05-29 ENCOUNTER — Ambulatory Visit: Payer: Self-pay

## 2021-05-29 VITALS — BP 153/86 | HR 72

## 2021-05-29 DIAGNOSIS — M5416 Radiculopathy, lumbar region: Secondary | ICD-10-CM

## 2021-05-29 DIAGNOSIS — M48062 Spinal stenosis, lumbar region with neurogenic claudication: Secondary | ICD-10-CM

## 2021-05-29 DIAGNOSIS — M797 Fibromyalgia: Secondary | ICD-10-CM

## 2021-05-29 MED ORDER — METHYLPREDNISOLONE ACETATE 80 MG/ML IJ SUSP
80.0000 mg | Freq: Once | INTRAMUSCULAR | Status: AC
  Administered 2021-05-29: 80 mg

## 2021-05-29 NOTE — Patient Instructions (Signed)

## 2021-05-29 NOTE — Progress Notes (Signed)
Pt state lower back pain that travels down both legs. Pt state when getting up out of bed or a sitting position the pain gets worse. Pt state walking makes the pain worse. Pt state she uses heat and pain meds to help ease her pain. Pt has hx of inj on 02/09/21 pt state it helped.  Numeric Pain Rating Scale and Functional Assessment Average Pain 7   In the last MONTH (on 0-10 scale) has pain interfered with the following?  1. General activity like being  able to carry out your everyday physical activities such as walking, climbing stairs, carrying groceries, or moving a chair?  Rating(9)   +Driver, -BT, -Dye Allergies.

## 2021-06-01 ENCOUNTER — Telehealth: Payer: Self-pay | Admitting: *Deleted

## 2021-06-01 ENCOUNTER — Other Ambulatory Visit (HOSPITAL_BASED_OUTPATIENT_CLINIC_OR_DEPARTMENT_OTHER): Payer: Self-pay | Admitting: Obstetrics & Gynecology

## 2021-06-01 NOTE — Telephone Encounter (Signed)
TC to pt.  It was noticed that patient cancelled labwork on 05/23/21 for Vit D recheck.  Pt has been extremely busy due to husband's death and felt like didn't have time to come to office for blood work.  Pt is seeing her oncologist in March and asked if could have it done then. I said yes, just make sure she asked Dr Lindi Adie.  In the meantime, the patient was advised to take 5000 units of Vit D over the counter until we get the results in March.  Pt was thankful that I called to check on her. Dr Sabra Heck informed. Sharrie Rothman CMA

## 2021-06-10 NOTE — Procedures (Signed)
Lumbosacral Transforaminal Epidural Steroid Injection - Sub-Pedicular Approach with Fluoroscopic Guidance  Patient: [name]      Date of Birth: June 02, 1945 MRN: [id number] PCP: Seward Carol, MD      Visit Date: 05/29/2021   Universal Protocol:    Date/Time: 05/29/2021  Consent Given By: the patient  Position: PRONE  Additional Comments: Vital signs were monitored before and after the procedure. Patient was prepped and draped in the usual sterile fashion. The correct patient, procedure, and site was verified.   Injection Procedure Details:   Procedure diagnoses: Lumbar radiculopathy [M54.16]    Meds Administered:  Meds ordered this encounter  Medications   methylPREDNISolone acetate (DEPO-MEDROL) injection 80 mg    Laterality: Bilateral  Location/Site: L5  Needle:5.0 in., 22 ga.  Short bevel or Quincke spinal needle  Needle Placement: Transforaminal  Findings:    -Comments: Excellent flow of contrast along the nerve, nerve root and into the epidural space.  Procedure Details: After squaring off the end-plates to get a true AP view, the C-arm was positioned so that an oblique view of the foramen as noted above was visualized. The target area is just inferior to the "nose of the scotty dog" or sub pedicular. The soft tissues overlying this structure were infiltrated with 2-3 ml. of 1% Lidocaine without Epinephrine.  The spinal needle was inserted toward the target using a "trajectory" view along the fluoroscope beam.  Under AP and lateral visualization, the needle was advanced so it did not puncture dura and was located close the 6 O'Clock position of the pedical in AP tracterory. Biplanar projections were used to confirm position. Aspiration was confirmed to be negative for CSF and/or blood. A 1-2 ml. volume of Isovue-250 was injected and flow of contrast was noted at each level. Radiographs were obtained for documentation purposes.   After attaining the desired  flow of contrast documented above, a 0.5 to 1.0 ml test dose of 0.25% Marcaine was injected into each respective transforaminal space.  The patient was observed for 90 seconds post injection.  After no sensory deficits were reported, and normal lower extremity motor function was noted,   the above injectate was administered so that equal amounts of the injectate were placed at each foramen (level) into the transforaminal epidural space.   Additional Comments:  The patient tolerated the procedure well Dressing: 2 x 2 sterile gauze and Band-Aid    Post-procedure details: Patient was observed during the procedure. Post-procedure instructions were reviewed.  Patient left the clinic in stable condition.

## 2021-06-10 NOTE — Progress Notes (Signed)
[name] - 76 y.o. female MRN [id number]  Date of birth: 04/20/1946  Office Visit Note: Visit Date: 05/29/2021 PCP: Seward Carol, MD Referred by: Seward Carol, MD  Subjective: Chief Complaint  Patient presents with   Lower Back - Pain   Right Leg - Pain   Left Leg - Pain   HPI:  [name] is a 76 y.o. female who comes in todayHPI ROS Otherwise per HPI.  Assessment & Plan: Visit Diagnoses:    ICD-10-CM   1. Lumbar radiculopathy  M54.16 XR C-ARM NO REPORT    Epidural Steroid injection    methylPREDNISolone acetate (DEPO-MEDROL) injection 80 mg    2. Spinal stenosis of lumbar region with neurogenic claudication  M48.062     3. Fibromyalgia  M79.7       Plan: No additional findings.   Meds & Orders:  Meds ordered this encounter  Medications   methylPREDNISolone acetate (DEPO-MEDROL) injection 80 mg    Orders Placed This Encounter  Procedures   XR C-ARM NO REPORT   Epidural Steroid injection    Follow-up: Return if symptoms worsen or fail to improve.   Procedures: No procedures performed  Lumbosacral Transforaminal Epidural Steroid Injection - Sub-Pedicular Approach with Fluoroscopic Guidance  Patient: [name]      Date of Birth: 12/15/1945 MRN: [id number] PCP: Seward Carol, MD      Visit Date: 05/29/2021   Universal Protocol:    Date/Time: 05/29/2021  Consent Given By: the patient  Position: PRONE  Additional Comments: Vital signs were monitored before and after the procedure. Patient was prepped and draped in the usual sterile fashion. The correct patient, procedure, and site was verified.   Injection Procedure Details:   Procedure diagnoses: Lumbar radiculopathy [M54.16]    Meds Administered:  Meds ordered this encounter  Medications   methylPREDNISolone acetate (DEPO-MEDROL) injection 80 mg    Laterality: Bilateral  Location/Site: L5  Needle:5.0 in., 22 ga.  Short bevel or Quincke spinal needle  Needle  Placement: Transforaminal  Findings:    -Comments: Excellent flow of contrast along the nerve, nerve root and into the epidural space.  Procedure Details: After squaring off the end-plates to get a true AP view, the C-arm was positioned so that an oblique view of the foramen as noted above was visualized. The target area is just inferior to the "nose of the scotty dog" or sub pedicular. The soft tissues overlying this structure were infiltrated with 2-3 ml. of 1% Lidocaine without Epinephrine.  The spinal needle was inserted toward the target using a "trajectory" view along the fluoroscope beam.  Under AP and lateral visualization, the needle was advanced so it did not puncture dura and was located close the 6 O'Clock position of the pedical in AP tracterory. Biplanar projections were used to confirm position. Aspiration was confirmed to be negative for CSF and/or blood. A 1-2 ml. volume of Isovue-250 was injected and flow of contrast was noted at each level. Radiographs were obtained for documentation purposes.   After attaining the desired flow of contrast documented above, a 0.5 to 1.0 ml test dose of 0.25% Marcaine was injected into each respective transforaminal space.  The patient was observed for 90 seconds post injection.  After no sensory deficits were reported, and normal lower extremity motor function was noted,   the above injectate was administered so that equal amounts of the injectate were placed at each foramen (level) into the transforaminal epidural space.   Additional Comments:  The patient tolerated the procedure well Dressing: 2 x 2 sterile gauze and Band-Aid    Post-procedure details: Patient was observed during the procedure. Post-procedure instructions were reviewed.  Patient left the clinic in stable condition.    Clinical History: MRI LUMBAR SPINE WITHOUT CONTRAST   TECHNIQUE: Multiplanar, multisequence MR imaging of the lumbar spine was performed. No  intravenous contrast was administered.   COMPARISON:  01/29/2014   FINDINGS: Segmentation:  5 lumbar type vertebrae   Alignment: Levoscoliosis. Grade 1 anterolisthesis at L4-5. Mild retrolisthesis at L1-2 and L2-3. Misalignment has progressed from 2015.   Vertebrae:  No fracture, evidence of discitis, or bone lesion.   Conus medullaris and cauda equina: Conus extends to the L1-2 level. Conus and cauda equina appear normal. 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   Disc levels:   T12- L1: Disc narrowing and ventral spurring.  No impingement   L1-L2: Rightward disc collapse with far-lateral bulging and ridging. Asymmetric right facet spurring. Moderate right foraminal narrowing. Noncompressive right subarticular recess stenosis.   L2-L3: Disc narrowing and bulging with retrolisthesis. Mild facet spurring. Mild to moderate left foraminal narrowing.   L3-L4: Disc narrowing and left eccentric bulging. Degenerative facet spurring asymmetric to the left. Mild left subarticular recess stenosis.   L4-L5: Facet osteoarthritis with spurring and anterolisthesis. Moderate disc space narrowing with mild bulging. Bilateral root sleeve cysts. Mild to moderate spinal stenosis with asymmetric left subarticular recess narrowing   L5-S1:Disc narrowing and minor bulging. Mild facet spurring. No neural compression.   IMPRESSION: 1. Diffuse disc and facet degeneration with scoliosis and multilevel listhesis, progressed from 2015. 2. Mild to moderate foraminal narrowing on the right at L1-2 and left at L2-3. 3. Mild to moderate spinal stenosis at L4-5 with asymmetric left subarticular recess narrowing.     Electronically Signed   By: Monte Fantasia M.D.   On: 05/08/2020 07:15 ---- MRI LUMBAR SPINE WITHOUT CONTRAST  IMPRESSION: 1. Mild broad-based disc bulges throughout the lumbar spine. 2. Mild bilateral facet  arthropathy with mild bilateral lateral recess stenosis at L4-5.   Electronically Signed By: Kathreen Devoid On: 01/29/2014 12:05     Objective:  VS:  HT:     WT:    BMI:      BP:(!) 153/86   HR:72bpm   TEMP: ( )   RESP:  Physical Exam   Imaging: No results found.

## 2021-07-10 NOTE — Progress Notes (Incomplete)
Patient Care Team: Seward Carol, MD as PCP - General (Internal Medicine) Jettie Booze, MD as PCP - Cardiology (Cardiology) Magrinat, Virgie Dad, MD (Inactive) as Consulting Physician (Oncology) Fanny Skates, MD as Consulting Physician (General Surgery) Luberta Robertson, MD (Radiology) Hennie Duos, MD as Consulting Physician (Rheumatology) Domingo Pulse, MD (Urology) Druscilla Brownie, MD as Consulting Physician (Dermatology) Luberta Mutter, MD as Consulting Physician (Ophthalmology) Magnus Sinning, MD as Consulting Physician (Physical Medicine and Rehabilitation) Megan Salon, MD as Consulting Physician (Gynecology) Loney Loh, MD (Dermatology)  DIAGNOSIS: No diagnosis found.  SUMMARY OF ONCOLOGIC HISTORY: Oncology History  Malignant neoplasm of upper-outer quadrant of right breast in female, estrogen receptor positive ([place])  08/23/2015 Initial Biopsy   Right breast upper outer quadrant and lymph node biposy: IDC, grade 2, ER+, PR 5% +, Ki-67 20-30%, HER-2 negative.    09/01/2015 Initial Diagnosis   Malignant neoplasm of upper-outer quadrant of right breast in female, estrogen receptor positive ([place])   09/12/2015 - 11/14/2015 Neo-Adjuvant Chemotherapy   Docetaxel and Cyclophosphamide x 4   09/13/2015 Initial Biopsy   Satelite nodule biopsy: IDC, grade 2, identical prognostic panel to previous biopsy   12/05/2015 Surgery   Right lumpectomy with ALND Dalbert Batman): IDC grade 2, 1.5cm, DCIS, + lymphovascular invasion, 15/17 SLN positive   01/18/2016 - 03/05/2016 Radiation Therapy   Adjuvant radiation with capecitabine ([place]): 1) Right breast/nodal areas: 45 Gy in 25 fractions  2) Right breast boost: 16 Gy in 8 fractions   02/07/2016 Genetic Testing   Genetic testing was normal, and did not reveal a deleterious mutation.  Additionally, no variants of uncertain significance (VUSes) were found.  Genes tested include: ATM, BARD1, BRCA1, BRCA2, BRIP1, CDH1, CHEK2,  FANCC, MLH1, MSH2, MSH6, NBN, PALB2, PMS2, PTEN, RAD51C, RAD51D, TP53, and XRCC2.  This panel also includes deletion/duplication analysis (without sequencing) for one gene, EPCAM.   06/01/2016 -  Anti-estrogen oral therapy   Anastrozole daily (on pallas)     CHIEF COMPLIANT: Estrogen receptor positive breast cancer  INTERVAL HISTORY: [name] is a 76 yo with the above mention Estrogen receptor positive breast cancer. She presents to the clinic today for a follow-up. She states that   ALLERGIES:  is allergic to chlorhexidine and other.  MEDICATIONS:  Current Outpatient Medications  Medication Sig Dispense Refill   albuterol (PROVENTIL HFA;VENTOLIN HFA) 108 (90 Base) MCG/ACT inhaler Inhale 1-2 puffs into the lungs every 6 (six) hours as needed for wheezing or shortness of breath. Started in 1998     anastrozole (ARIMIDEX) 1 MG tablet Take 1 tablet (1 mg total) by mouth daily. 90 tablet 4   BRILINTA 90 MG TABS tablet TAKE 1 TABLET BY MOUTH TWICE DAILY. 60 tablet 11   buPROPion (WELLBUTRIN XL) 300 MG 24 hr tablet Take 300 mg by mouth daily.      Calcium Carbonate-Vitamin D (CALCIUM + D PO) Take 1 tablet by mouth at bedtime. Started approximately 1998     carisoprodol (SOMA) 350 MG tablet Take 350 mg by mouth 2 (two) times daily as needed for muscle spasms. Started in 2008.  2   cephALEXin (KEFLEX) 500 MG capsule Take 1 capsule (500 mg total) by mouth 2 (two) times daily. 20 capsule 0   diazepam (VALIUM) 5 MG tablet Take 5 mg by mouth every 6 (six) hours as needed for muscle spasms (interstitial cystitis). Started approximately 2008.     furosemide (LASIX) 20 MG tablet TAKE 1 TABLET(20 MG) BY MOUTH DAILY AS  NEEDED FOR SHORTNESS OF BREATH 30 tablet 10   gabapentin (NEURONTIN) 300 MG capsule TAKE 3 TO 4 CAPSULES(900 TO 1200 MG) BY MOUTH AT BEDTIME 120 capsule 4   HYDROcodone-acetaminophen (NORCO) 10-325 MG tablet Take 1 tablet by mouth every 4 (four) hours as needed for moderate pain or  severe pain (as needed for fibromyalgia and IC).      hydrOXYzine (ATARAX/VISTARIL) 25 MG tablet Take 1 tablet by mouth at bedtime.     levothyroxine (SYNTHROID, LEVOTHROID) 75 MCG tablet Take 75 mcg by mouth daily.  2   LORazepam (ATIVAN) 2 MG tablet Take 1 tablet by mouth daily as needed for anxiety.   5   nitroGLYCERIN (NITROSTAT) 0.4 MG SL tablet For chest pain, place 1 tablet under tongue and let dissolve, wait 5 mins if still having chest pain you may repeat this 2 more times. If still having chest pain call 911 or have someone drive you to the closest ER. 25 tablet 1   NONFORMULARY OR COMPOUNDED ITEM Vit E vaginal suppositories 2107m/ml.  One pv three times weekly.  #36/4RF 36 each 4   ondansetron (ZOFRAN) 4 MG tablet Take 1-2 tablets (4-8 mg total) by mouth every 8 (eight) hours as needed for nausea or vomiting. 60 tablet 4   ondansetron (ZOFRAN) 8 MG tablet TAKE 1 TABLET EVERY 8 HOURS AS NEEDED FOR NAUSEA AND VOMITING. 20 tablet 1   pantoprazole (PROTONIX) 40 MG tablet Take 40 mg by mouth daily. Started in 2012.     potassium chloride (KLOR-CON) 10 MEQ tablet TAKE 1 TABLET BY MOUTH AS NEEDED. TAKE WITH LASIX 30 tablet 10   prochlorperazine (COMPAZINE) 10 MG tablet Take 1 tablet (10 mg total) by mouth every 6 (six) hours as needed for nausea or vomiting. 385 tablet 1   promethazine (PHENERGAN) 25 MG tablet Take 0.5-1 tablets (12.5-25 mg total) by mouth every 6 (six) hours as needed for nausea or vomiting. 90 tablet 4   rosuvastatin (CRESTOR) 40 MG tablet TAKE 1 TABLET(40 MG) BY MOUTH DAILY 90 tablet 3   Vaginal Lubricant (REPLENS VA) Place vaginally daily.     Vitamin D, Ergocalciferol, (DRISDOL) 1.25 MG (50000 UNIT) CAPS capsule Take 1 capsule (50,000 Units total) by mouth every 7 (seven) days. 12 capsule 2   zolpidem (AMBIEN) 10 MG tablet Take 10 mg by mouth at bedtime as needed for sleep.     No current facility-administered medications for this visit.    PHYSICAL EXAMINATION: ECOG  PERFORMANCE STATUS: {CHL ONC ECOG PS:[phone]}  There were no vitals filed for this visit. There were no vitals filed for this visit.  BREAST:*** No palpable masses or nodules in either right or left breasts. No palpable axillary supraclavicular or infraclavicular adenopathy no breast tenderness or nipple discharge. (exam performed in the presence of a chaperone)  LABORATORY DATA:  I have reviewed the data as listed CMP Latest Ref Rng & Units 01/11/2021 07/07/2020 04/11/2020  Glucose 70 - 99 mg/dL 105(H) 100(H) -  BUN 8 - 23 mg/dL 16 13 -  Creatinine 0.44 - 1.00 mg/dL 0.87 0.92 -  Sodium 135 - 145 mmol/L 142 138 -  Potassium 3.5 - 5.1 mmol/L 4.2 4.4 -  Chloride 98 - 111 mmol/L 107 105 -  CO2 22 - 32 mmol/L 24 27 -  Calcium 8.9 - 10.3 mg/dL 9.5 9.0 -  Total Protein 6.5 - 8.1 g/dL 7.2 7.1 6.6  Total Bilirubin 0.3 - 1.2 mg/dL 0.5 0.5 0.6  Alkaline Phos 38 -  126 U/L 82 82 82  AST 15 - 41 U/L 19 19 18   ALT 0 - 44 U/L 17 18 15     Lab Results  Component Value Date   WBC 7.9 01/11/2021   HGB 13.9 01/11/2021   HCT 41.3 01/11/2021   MCV 94.3 01/11/2021   PLT 255 01/11/2021   NEUTROABS 5.6 01/11/2021    ASSESSMENT & PLAN:  No problem-specific Assessment & Plan notes found for this encounter.    No orders of the defined types were placed in this encounter.  The patient has a good understanding of the overall plan. she agrees with it. she will call with any problems that may develop before the next visit here. Total time spent: 30 mins including face to face time and time spent for planning, charting and co-ordination of care   Suzzette Righter, [place] 07/10/21    I, Gardiner Coins, am acting as a scribe for Dr. Lindi Adie

## 2021-07-11 ENCOUNTER — Other Ambulatory Visit: Payer: Self-pay

## 2021-07-11 ENCOUNTER — Other Ambulatory Visit: Payer: Self-pay | Admitting: *Deleted

## 2021-07-11 ENCOUNTER — Inpatient Hospital Stay (HOSPITAL_BASED_OUTPATIENT_CLINIC_OR_DEPARTMENT_OTHER): Payer: Medicare Other | Admitting: Hematology and Oncology

## 2021-07-11 ENCOUNTER — Inpatient Hospital Stay: Payer: Medicare Other | Attending: Hematology and Oncology

## 2021-07-11 ENCOUNTER — Encounter: Payer: Self-pay | Admitting: *Deleted

## 2021-07-11 DIAGNOSIS — T451X5A Adverse effect of antineoplastic and immunosuppressive drugs, initial encounter: Secondary | ICD-10-CM | POA: Insufficient documentation

## 2021-07-11 DIAGNOSIS — C50411 Malignant neoplasm of upper-outer quadrant of right female breast: Secondary | ICD-10-CM | POA: Insufficient documentation

## 2021-07-11 DIAGNOSIS — Z79899 Other long term (current) drug therapy: Secondary | ICD-10-CM | POA: Insufficient documentation

## 2021-07-11 DIAGNOSIS — Z923 Personal history of irradiation: Secondary | ICD-10-CM | POA: Diagnosis not present

## 2021-07-11 DIAGNOSIS — Z170 Estrogen receptor positive status [ER+]: Secondary | ICD-10-CM | POA: Insufficient documentation

## 2021-07-11 DIAGNOSIS — Z79811 Long term (current) use of aromatase inhibitors: Secondary | ICD-10-CM | POA: Insufficient documentation

## 2021-07-11 DIAGNOSIS — M818 Other osteoporosis without current pathological fracture: Secondary | ICD-10-CM

## 2021-07-11 DIAGNOSIS — Z006 Encounter for examination for normal comparison and control in clinical research program: Secondary | ICD-10-CM | POA: Diagnosis present

## 2021-07-11 DIAGNOSIS — Z9221 Personal history of antineoplastic chemotherapy: Secondary | ICD-10-CM | POA: Insufficient documentation

## 2021-07-11 DIAGNOSIS — G620 Drug-induced polyneuropathy: Secondary | ICD-10-CM | POA: Diagnosis not present

## 2021-07-11 LAB — CMP (CANCER CENTER ONLY)
ALT: 14 U/L (ref 0–44)
AST: 18 U/L (ref 15–41)
Albumin: 4.3 g/dL (ref 3.5–5.0)
Alkaline Phosphatase: 71 U/L (ref 38–126)
Anion gap: 9 (ref 5–15)
BUN: 16 mg/dL (ref 8–23)
CO2: 24 mmol/L (ref 22–32)
Calcium: 9.2 mg/dL (ref 8.9–10.3)
Chloride: 104 mmol/L (ref 98–111)
Creatinine: 0.81 mg/dL (ref 0.44–1.00)
GFR, Estimated: >60 mL/min (ref >60)
Glucose, Bld: 103 mg/dL — ABNORMAL HIGH (ref 70–99)
Potassium: 4.2 mmol/L (ref 3.5–5.1)
Sodium: 137 mmol/L (ref 135–145)
Total Bilirubin: 0.5 mg/dL (ref 0.3–1.2)
Total Protein: 7.1 g/dL (ref 6.5–8.1)

## 2021-07-11 LAB — CBC WITH DIFFERENTIAL (CANCER CENTER ONLY)
Abs Immature Granulocytes: 0.03 10*3/uL (ref 0.00–0.07)
Basophils Absolute: 0 10*3/uL (ref 0.0–0.1)
Basophils Relative: 1 %
Eosinophils Absolute: 0.1 10*3/uL (ref 0.0–0.5)
Eosinophils Relative: 1 %
HCT: 42.9 % (ref 36.0–46.0)
Hemoglobin: 14.3 g/dL (ref 12.0–15.0)
Immature Granulocytes: 0 %
Lymphocytes Relative: 18 %
Lymphs Abs: 1.4 10*3/uL (ref 0.7–4.0)
MCH: 31.5 pg (ref 26.0–34.0)
MCHC: 33.3 g/dL (ref 30.0–36.0)
MCV: 94.5 fL (ref 80.0–100.0)
Monocytes Absolute: 0.6 10*3/uL (ref 0.1–1.0)
Monocytes Relative: 8 %
Neutro Abs: 5.5 10*3/uL (ref 1.7–7.7)
Neutrophils Relative %: 72 %
Platelet Count: 282 10*3/uL (ref 150–400)
RBC: 4.54 MIL/uL (ref 3.87–5.11)
RDW: 13.7 % (ref 11.5–15.5)
WBC Count: 7.6 10*3/uL (ref 4.0–10.5)
nRBC: 0 % (ref 0.0–0.2)

## 2021-07-11 LAB — RESEARCH LABS

## 2021-07-11 MED ORDER — CEPHALEXIN 500 MG PO CAPS: 500.0000 mg | ORAL_CAPSULE | Freq: Two times a day (BID) | ORAL | 0 refills | Status: DC

## 2021-07-11 NOTE — Assessment & Plan Note (Addendum)
08/23/2015: Right breast T1N2 stage IIIa grade 2 IDC ER positive, PR 5%, Ki-67 20 to 30%, HER2 negative ?08/24/15: Left breast biopsy: CSL ?09/12/2015-11/14/2015: Neoadjuvant TC x4 ?12/05/2015: Right lumpectomy: T2N3 stage IIIc IDC grade 2 ?01/18/2016-03/15/2016: Adjuvant radiation with capecitabine (capecitabine maintenance completed January 2018) ?Current treatment: Anastrozole started 06/01/2016 (PALLAS clinical trial: Randomized to anastrozole alone) ?Anastrozole toxicities: Tolerating it well without any major problems or concerns. ? 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 ? ?Breast cancer surveillance: ?1.  Breast exam 07/11/2021: Benign ?2. mammogram 02/16/2021: Benign breast density category B ? ?Return to clinic in 1 year for follow-up ?

## 2021-07-11 NOTE — Research (Signed)
PALLAS: PALBOCICLIB COLLABORATIVE ADJUVANT STUDY: A RANDOMIZED PHASE III TRIAL OF PALBOCICLIB WITH STANDARD ADJUVANT ENDOCRINE THERAPY VERSUS STANDARD ADJUVANT ENDOCRINE THERAPY ALONE FOR HORMONE RECEPTOR POSITIVE (HR+)/HUMAN EPIDERMAL GROWTH FACTOR RECEPTOR 2 (HER2)-NEGATIVE EARLY BREAST CANCER  ? ?AFT-05 PALLAS - Month 60 follow-up visit ?Patient into clinic today unaccompanied for a study visit with Dr.Gudena.  Research nurse met with patient to collect data for this time point. ? ?Lab- Research specimens collected per protocol.  ?  ?Anti-cancer medications - Patient states she is continuing to take anastrozole without interruption; she is on no new anti-cancer medications. ?  ?Serious adverse events - Patient denies any serious health problems since her last visit. ?  ?Disease monitoring - See MD note.  Dr. Lindi Adie examined the pt today including a breast exam, and he states patient has no evidence of disease recurrence.   ?  ?COVID-19 - The pt has not had any Covid-19 vaccines or positive Covid-19 tests since last study visit.    ?  ?The patient is aware that her next research visit- year 6 is due in March 2024.  The pt verbalized understanding.  The research staff will see the pt at this visit.  The patient was thanked for her participation in this research study. ? ?Foye Spurling, BSN, RN, CCRP ?Clinical Research Nurse II ?07/11/2021 3:42 PM ? ? ?

## 2021-07-11 NOTE — Research (Signed)
Trial:  ACCRU-[employer]-2102 - TREATMENT OF ESTABLISHED CHEMOTHERAPY-INDUCED NEUROPATHY WITH N-PALMITOYLETHANOLAMIDE, A CANNABIMIMETIC NUTRACEUTICAL: A RANDOMIZED DOUBLE-BLIND PHASE II PILOT TRIAL  ?Patient [name] was identified by Dr. Lindi Adie as a potential candidate for the above listed study.  This Clinical Research Nurse met with [name], [id number], on 07/11/21 in a manner and location that ensures patient privacy to discuss participation in the above listed research study.  Patient is Unaccompanied.  A copy of the informed consent document with embedded HIPAA language was provided to the patient.  Patient reads, speaks, and understands [place].   ?Approximately 10 minutes were spent with the patient reviewing the informed consent documents.  Brief discussion of eligibility revealed patient is taking opioid pain medication daily for chronic pain and therefore is not eligible to participate on this study.  Patient verbalized understanding. Dr. Lindi Adie notified.  ?Foye Spurling, BSN, RN, CCRP ?Clinical Research Nurse II ?07/11/2021 3:50 PM ? ?  ?

## 2021-07-12 ENCOUNTER — Encounter: Payer: Self-pay | Admitting: Oncology

## 2021-07-12 ENCOUNTER — Telehealth: Payer: Self-pay | Admitting: Hematology and Oncology

## 2021-07-12 NOTE — Telephone Encounter (Signed)
Scheduled appointment per 3/14 los. Patient is aware. ?

## 2021-07-18 ENCOUNTER — Ambulatory Visit (INDEPENDENT_AMBULATORY_CARE_PROVIDER_SITE_OTHER): Payer: Medicare Other

## 2021-07-18 ENCOUNTER — Ambulatory Visit (INDEPENDENT_AMBULATORY_CARE_PROVIDER_SITE_OTHER): Payer: Medicare Other | Admitting: Orthopedic Surgery

## 2021-07-18 DIAGNOSIS — M25512 Pain in left shoulder: Secondary | ICD-10-CM

## 2021-07-18 LAB — 25-HYDROXY VITAMIN D LCMS D2+D3
25-Hydroxy, Vitamin D-2: 47 ng/mL
25-Hydroxy, Vitamin D-3: 8.8 ng/mL
25-Hydroxy, Vitamin D: 56 ng/mL

## 2021-07-18 MED ORDER — PREDNISONE 10 MG PO TABS: 10.0000 mg | ORAL_TABLET | Freq: Every day | ORAL | 0 refills | Status: DC

## 2021-07-19 ENCOUNTER — Telehealth (HOSPITAL_BASED_OUTPATIENT_CLINIC_OR_DEPARTMENT_OTHER): Payer: Self-pay

## 2021-07-19 NOTE — Telephone Encounter (Signed)
Patient called today. She states that while she was seeing oncology last week, she had her vitamin d levels drawn and would like for you to take a look at the results to let her know if she needs treatment. Please advise. tbw ?

## 2021-07-26 ENCOUNTER — Encounter: Payer: Self-pay | Admitting: Orthopedic Surgery

## 2021-07-26 ENCOUNTER — Other Ambulatory Visit (HOSPITAL_BASED_OUTPATIENT_CLINIC_OR_DEPARTMENT_OTHER): Payer: Self-pay

## 2021-07-26 DIAGNOSIS — E559 Vitamin D deficiency, unspecified: Secondary | ICD-10-CM

## 2021-07-26 DIAGNOSIS — M25512 Pain in left shoulder: Secondary | ICD-10-CM

## 2021-07-26 MED ORDER — METHYLPREDNISOLONE ACETATE 40 MG/ML IJ SUSP
40.0000 mg | INTRAMUSCULAR | Status: AC | PRN
  Administered 2021-07-26: 40 mg via INTRA_ARTICULAR

## 2021-07-26 MED ORDER — VITAMIN D (ERGOCALCIFEROL) 1.25 MG (50000 UNIT) PO CAPS: 50000.0000 [IU] | ORAL_CAPSULE | ORAL | 2 refills | Status: DC

## 2021-07-26 MED ORDER — LIDOCAINE HCL 1 % IJ SOLN
5.0000 mL | INTRAMUSCULAR | Status: AC | PRN
  Administered 2021-07-26: 5 mL

## 2021-07-26 NOTE — Telephone Encounter (Signed)
Called and spoke with patient about the below message per Dr. Sabra Heck. Patient expresses understanding and will continue on vitamin d 50,000IU/weekly. She asked that we send Rx into the [facility]. Rx sent. tbw ?

## 2021-07-26 NOTE — Progress Notes (Signed)
? ?Office Visit Note ?  ?Patient: [name]           ?Date of Birth: 1945/06/11           ?MRN: [id number] ?Visit Date: 07/18/2021 ?             ?Requested by: Seward Carol, MD ?[address] ?[address] ?[place],  [place] [address] ?PCP: Seward Carol, MD ? ?Chief Complaint  ?Patient presents with  ? Left Shoulder - Pain  ? ? ? ? ?HPI: ?Patient is a 76 year old woman who presents with a 3-week history of left shoulder pain.  She denies any falls she states that the beagle can pull on her leash. ? ?Patient states her husband died 8 months ago and she is in a lot of stress at home.  She is status post cardiac stents she states she cannot take nonsteroidals.  Patient reports a recent history of medial epicondyle pain.  She reports a history of fibromyalgia. ? ?Assessment & Plan: ?Visit Diagnoses:  ?1. Acute pain of left shoulder   ? ? ?Plan: The shoulder was injected she tolerated this well follow-up in 4 weeks.  Discussed that we could try low-dose prednisone.  Prescription sent in for prednisone. ? ?Follow-Up Instructions: Return in about 4 weeks (around 08/15/2021).  ? ?Ortho Exam ? ?Patient is alert, oriented, no adenopathy, well-dressed, normal affect, normal respiratory effort. ?Examination patient is tender to palpation of the medial epicondyle of right elbow consistent with medial epicondylitis.  There is no redness no cellulitis no signs of infection.  Examination of the left shoulder she has pain with Neer and Hawkins impingement test no adhesive capsulitis. ? ?Imaging: ?No results found. ?No images are attached to the encounter. ? ?Labs: ?Lab Results  ?Component Value Date  ? HGBA1C 5.4 11/04/2017  ? HGBA1C 5.2 05/29/2017  ? HGBA1C 4.9 01/03/2017  ? REPTSTATUS 01/05/2017 FINAL 01/03/2017  ? CULT (A) 01/03/2017  ?  40,000 COLONIES/mL ESCHERICHIA COLI ?Confirmed Extended Spectrum Beta-Lactamase Producer (ESBL) ?80,000 COLONIES/mL ENTEROCOCCUS FAECALIS ?  ? LABORGA ESCHERICHIA COLI (A) 01/03/2017  ?  LABORGA ENTEROCOCCUS FAECALIS (A) 01/03/2017  ? ? ? ?Lab Results  ?Component Value Date  ? ALBUMIN 4.3 07/11/2021  ? ALBUMIN 3.9 01/11/2021  ? ALBUMIN 3.8 07/07/2020  ? ? ?No results found for: MG ?Lab Results  ?Component Value Date  ? VD25OH 34 03/21/2015  ? VD25OH 30 03/18/2014  ? VD25OH 58 02/06/2013  ? ? ?No results found for: PREALBUMIN ? ?  Latest Ref Rng & Units 07/11/2021  ?  2:28 PM 01/11/2021  ?  2:34 PM 07/07/2020  ?  1:32 PM  ?CBC EXTENDED  ?WBC 4.0 - 10.5 K/uL 7.6   7.9   7.5    ?RBC 3.87 - 5.11 MIL/uL 4.54   4.38   4.32    ?Hemoglobin 12.0 - 15.0 g/dL 14.3   13.9   13.8    ?HCT 36.0 - 46.0 % 42.9   41.3   41.6    ?Platelets 150 - 400 K/uL 282   255   241    ?NEUT# 1.7 - 7.7 K/uL 5.5   5.6   5.4    ?Lymph# 0.7 - 4.0 K/uL 1.4   1.7   1.3    ? ? ? ?There is no height or weight on file to calculate BMI. ? ?Orders:  ?Orders Placed This Encounter  ?Procedures  ? XR Shoulder Left  ? ?Meds ordered this encounter  ?Medications  ?  predniSONE (DELTASONE) 10 MG tablet  ?  Sig: Take 1 tablet (10 mg total) by mouth daily with breakfast.  ?  Dispense:  30 tablet  ?  Refill:  0  ? ? ? Procedures: ?Large Joint Inj: L subacromial bursa on 07/26/2021 5:24 PM ?Indications: diagnostic evaluation and pain ?Details: 22 G 1.5 in needle, posterior approach ? ?Arthrogram: No ? ?Medications: 5 mL lidocaine 1 %; 40 mg methylPREDNISolone acetate 40 MG/ML ?Outcome: tolerated well, no immediate complications ?Procedure, treatment alternatives, risks and benefits explained, specific risks discussed. Consent was given by the patient. Immediately prior to procedure a time out was called to verify the correct patient, procedure, equipment, support staff and site/side marked as required. Patient was prepped and draped in the usual sterile fashion.  ? ? ? ?Clinical Data: ?No additional findings. ? ?ROS: ? ?All other systems negative, except as noted in the HPI. ?Review of Systems ? ?Objective: ?Vital Signs: LMP 04/30/1996 (Approximate)   ? ?Specialty Comments:  ?No specialty comments available. ? ?PMFS History: ?Patient Active Problem List  ? Diagnosis Date Noted  ? Lumbar radiculopathy 05/11/2019  ? Bilateral stenosis of lateral recess of lumbar spine 05/11/2019  ? Hyperlipidemia 12/23/2018  ? Chronic diastolic CHF (congestive heart failure) ([place]) 11/24/2018  ? Abnormal transaminases 03/24/2018  ? CAD (coronary artery disease) 02/11/2017  ? 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 Status post coronary artery stent placement   ? Non-ST elevation (NSTEMI) myocardial infarction [facility])   ? Depression with anxiety 12/31/2016  ? Chest pain 12/31/2016  ? Hyponatremia 12/31/2016  ? Chronic pain 12/31/2016  ? Essential hypertension 12/31/2016  ? Unstable angina (HCC)   ? Family history of breast cancer in female 02/08/2016  ? Rash and nonspecific skin eruption 10/03/2015  ? Sensorineural hearing loss (SNHL) of both ears 09/05/2015  ? Malignant neoplasm of upper-outer quadrant of right breast in female, estrogen receptor positive ([place]) 09/01/2015  ? Sclerosing adenosis of left breast 09/01/2015  ? Asthma 09/12/2011  ? Menopausal symptoms   ? Chronic vaginitis   ? Fibromyalgia   ? Interstitial cystitis   ? UNSPECIFIED HYPOTHYROIDISM 06/14/2009  ? DEPRESSIVE DISORDER NOT ELSEWHERE CLASSIFIED 06/14/2009  ? Hereditary and idiopathic peripheral neuropathy 06/14/2009  ? UNSPEC HEMORRHOIDS WITHOUT MENTION COMPLICATION 59/93/5701  ? ALLERGIC RHINITIS CAUSE UNSPECIFIED 06/14/2009  ? ESOPHAGEAL REFLUX 06/14/2009  ? Irritable bowel syndrome 06/14/2009  ? INSOMNIA UNSPECIFIED 06/14/2009  ? Osteoporosis 08/27/2007  ? ?Past Medical History:  ?Diagnosis Date  ? Anxiety   ? Arthritis 2008  ? Asthma 1953  ? age 50  ? Breast cancer ([place]) dx'd 2017  ? right  ? Cancer [facility])   ? breast cancer  ? Chronic vaginitis 1998  ? Fibromyalgia 1982  ? GERD (gastroesophageal reflux disease)   ? since at least 2011  ? Hemorrhoids 2009  ? History of radiation therapy 01/18/16-03/05/16  ? right  breast/nodal areas 45 Gy, right breast boost 16 Gy  ? Hypoglycemia 2003  ? Hypothyroidism 1964  ? age 72  ? Interstitial cystitis 1998  ? Irritable bowel syndrome 06/14/2009  ? Qualifier: Diagnosis of  By: Gustavo Lah    ? Menopausal symptoms   ? Osteoporosis 12/24/2006  ? Personal history of chemotherapy   ? Personal history of radiation therapy   ? Pneumonia 1979  ? varicella  ? PONV (postoperative nausea and vomiting) 1979  ?  ?Family History  ?Problem Relation Age of Onset  ? Hypertension Mother   ? Heart disease Mother   ?  d. 18  ? Parkinsonism Father   ?     d. 51  ? Heart disease Sister   ? Breast cancer Sister 18  ?     s/p mastectomy; reportedly negative BRCA1/2 testing approx 7 years ago  ? Melanoma Sister   ?     d. 68s; hx of incresed sun exposure  ? Cancer Sister   ?     dx. sarcoma of her face in her 57s; treated w/ MOHS surgery  ? Parkinsonism Paternal Aunt   ?     d. 90s  ? Parkinsonism Paternal Uncle   ?     d. late 81s  ? Kidney failure Maternal Grandmother 75  ? Heart attack Maternal Grandfather   ?     d. 51s  ? Heart disease Paternal Grandmother   ?     d. 69  ? Breast cancer Other 55  ?     maternal great aunt (MGF's sister)  ? Kidney cancer Paternal Uncle 73  ?     d. 48s; unsure about smoking status  ?  ?Past Surgical History:  ?Procedure Laterality Date  ? BLADDER HYPEREXTENSION    ? X4, prior to 01/2006  ? BLADDER SUSPENSION    ? BREAST BIOPSY    ? BREAST LUMPECTOMY Right   ? 2017  ? BREAST LUMPECTOMY WITH NEEDLE LOCALIZATION AND AXILLARY LYMPH NODE DISSECTION Right 12/05/2015  ? Procedure: RIGHT BREAST NEEDLE LOCALIZED (WIRES X 2) LUMPECTOMY WITH AXILLARY LYMPH NODE DISSECTION;  Surgeon: Fanny Skates, MD;  Location: [place];  Service: General;  Laterality: Right;  ? BREAST LUMPECTOMY WITH RADIOACTIVE SEED LOCALIZATION Left 12/05/2015  ? Procedure: RADIOACTIVE SEED GUIDED LEFT BREAST LUMPECTOMY;  Surgeon: Fanny Skates, MD;  Location: [place];  Service: General;  Laterality: Left;  ?  BREAST SURGERY    ? BUNIONECTOMY    ? prior to 01/2006  ? CARDIAC CATHETERIZATION    ? [place]  ? CHOLECYSTECTOMY  02/08/2006  ? DR. INGRAM  ? COLONOSCOPY    ? CORONARY STENT INTERVENTION N/A 9/4/

## 2021-08-15 ENCOUNTER — Encounter: Payer: Self-pay | Admitting: Orthopedic Surgery

## 2021-08-15 ENCOUNTER — Ambulatory Visit (INDEPENDENT_AMBULATORY_CARE_PROVIDER_SITE_OTHER): Payer: Medicare Other | Admitting: Orthopedic Surgery

## 2021-08-15 DIAGNOSIS — M25512 Pain in left shoulder: Secondary | ICD-10-CM | POA: Diagnosis not present

## 2021-08-15 DIAGNOSIS — M25521 Pain in right elbow: Secondary | ICD-10-CM

## 2021-08-15 MED ORDER — PREDNISONE 10 MG PO TABS: 10.0000 mg | ORAL_TABLET | Freq: Every day | ORAL | 0 refills | Status: DC

## 2021-08-15 NOTE — Progress Notes (Signed)
? ?Office Visit Note ?  ?Patient: [name]           ?Date of Birth: April 12, 1946           ?MRN: [id number] ?Visit Date: 08/15/2021 ?             ?Requested by: Seward Carol, MD ?[address] ?[address] ?[place],  [place] [address] ?PCP: Seward Carol, MD ? ?Chief Complaint  ?Patient presents with  ? Left Shoulder - Pain, Follow-up  ? Right Elbow - Pain, Follow-up  ? ? ? ? ?HPI: ?Patient is a 76 year old woman who presents in follow-up for 2 issues.  #1 she had left shoulder impingement symptoms which she states have resolved after the subacromial injection.  #2 she had some musculotendinous pain at the elbow just distal to the medial epicondyle.  She states the pain does not radiate it is localized in the musculotendinous area.  Patient states that she had been told she had cellulitis and did take some antibiotics but this did not seem to change anything.  She states she has had good interval relief with using prednisone 10 mg a day. ? ?Assessment & Plan: ?Visit Diagnoses:  ?1. Acute pain of left shoulder   ?2. Pain in right elbow   ? ? ?Plan: A refill prescription was called in for prednisone.  Again discussed risks of using prednisone including connective tissue bone density avascular necrosis.  Patient states she understands will follow-up as needed. ? ?Follow-Up Instructions: Return if symptoms worsen or fail to improve.  ? ?Ortho Exam ? ?Patient is alert, oriented, no adenopathy, well-dressed, normal affect, normal respiratory effort. ?Examination patient has no pain to palpation of the medial or lateral epicondyle of the right elbow there is no pain with supination or pronation.  The flexor tendon musculotendinous junction area proximal just distal to the medial epicondyle is where she is having pain there is no redness no cellulitis there is some swelling.  Patient's left shoulder is asymptomatic. ? ?Imaging: ?No results found. ?No images are attached to the encounter. ? ?Labs: ?Lab Results   ?Component Value Date  ? HGBA1C 5.4 11/04/2017  ? HGBA1C 5.2 05/29/2017  ? HGBA1C 4.9 01/03/2017  ? REPTSTATUS 01/05/2017 FINAL 01/03/2017  ? CULT (A) 01/03/2017  ?  40,000 COLONIES/mL ESCHERICHIA COLI ?Confirmed Extended Spectrum Beta-Lactamase Producer (ESBL) ?80,000 COLONIES/mL ENTEROCOCCUS FAECALIS ?  ? LABORGA ESCHERICHIA COLI (A) 01/03/2017  ? LABORGA ENTEROCOCCUS FAECALIS (A) 01/03/2017  ? ? ? ?Lab Results  ?Component Value Date  ? ALBUMIN 4.3 07/11/2021  ? ALBUMIN 3.9 01/11/2021  ? ALBUMIN 3.8 07/07/2020  ? ? ?No results found for: MG ?Lab Results  ?Component Value Date  ? VD25OH 34 03/21/2015  ? VD25OH 30 03/18/2014  ? VD25OH 58 02/06/2013  ? ? ?No results found for: PREALBUMIN ? ?  Latest Ref Rng & Units 07/11/2021  ?  2:28 PM 01/11/2021  ?  2:34 PM 07/07/2020  ?  1:32 PM  ?CBC EXTENDED  ?WBC 4.0 - 10.5 K/uL 7.6   7.9   7.5    ?RBC 3.87 - 5.11 MIL/uL 4.54   4.38   4.32    ?Hemoglobin 12.0 - 15.0 g/dL 14.3   13.9   13.8    ?HCT 36.0 - 46.0 % 42.9   41.3   41.6    ?Platelets 150 - 400 K/uL 282   255   241    ?NEUT# 1.7 - 7.7 K/uL 5.5   5.6  5.4    ?Lymph# 0.7 - 4.0 K/uL 1.4   1.7   1.3    ? ? ? ?There is no height or weight on file to calculate BMI. ? ?Orders:  ?No orders of the defined types were placed in this encounter. ? ?No orders of the defined types were placed in this encounter. ? ? ? Procedures: ?No procedures performed ? ?Clinical Data: ?No additional findings. ? ?ROS: ? ?All other systems negative, except as noted in the HPI. ?Review of Systems ? ?Objective: ?Vital Signs: LMP 04/30/1996 (Approximate)  ? ?Specialty Comments:  ?No specialty comments available. ? ?PMFS History: ?Patient Active Problem List  ? Diagnosis Date Noted  ? Lumbar radiculopathy 05/11/2019  ? Bilateral stenosis of lateral recess of lumbar spine 05/11/2019  ? Hyperlipidemia 12/23/2018  ? Chronic diastolic CHF (congestive heart failure) ([place]) 11/24/2018  ? Abnormal transaminases 03/24/2018  ? CAD (coronary artery disease)  02/11/2017  ? 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 Status post coronary artery stent placement   ? Non-ST elevation (NSTEMI) myocardial infarction [facility])   ? Depression with anxiety 12/31/2016  ? Chest pain 12/31/2016  ? Hyponatremia 12/31/2016  ? Chronic pain 12/31/2016  ? Essential hypertension 12/31/2016  ? Unstable angina (HCC)   ? Family history of breast cancer in female 02/08/2016  ? Rash and nonspecific skin eruption 10/03/2015  ? Sensorineural hearing loss (SNHL) of both ears 09/05/2015  ? Malignant neoplasm of upper-outer quadrant of right breast in female, estrogen receptor positive ([place]) 09/01/2015  ? Sclerosing adenosis of left breast 09/01/2015  ? Asthma 09/12/2011  ? Menopausal symptoms   ? Chronic vaginitis   ? Fibromyalgia   ? Interstitial cystitis   ? UNSPECIFIED HYPOTHYROIDISM 06/14/2009  ? DEPRESSIVE DISORDER NOT ELSEWHERE CLASSIFIED 06/14/2009  ? Hereditary and idiopathic peripheral neuropathy 06/14/2009  ? UNSPEC HEMORRHOIDS WITHOUT MENTION COMPLICATION 70/35/0093  ? ALLERGIC RHINITIS CAUSE UNSPECIFIED 06/14/2009  ? ESOPHAGEAL REFLUX 06/14/2009  ? Irritable bowel syndrome 06/14/2009  ? INSOMNIA UNSPECIFIED 06/14/2009  ? Osteoporosis 08/27/2007  ? ?Past Medical History:  ?Diagnosis Date  ? Anxiety   ? Arthritis 2008  ? Asthma 1953  ? age 14  ? Breast cancer ([place]) dx'd 2017  ? right  ? Cancer [facility])   ? breast cancer  ? Chronic vaginitis 1998  ? Fibromyalgia 1982  ? GERD (gastroesophageal reflux disease)   ? since at least 2011  ? Hemorrhoids 2009  ? History of radiation therapy 01/18/16-03/05/16  ? right breast/nodal areas 45 Gy, right breast boost 16 Gy  ? Hypoglycemia 2003  ? Hypothyroidism 1964  ? age 42  ? Interstitial cystitis 1998  ? Irritable bowel syndrome 06/14/2009  ? Qualifier: Diagnosis of  By: Gustavo Lah    ? Menopausal symptoms   ? Osteoporosis 12/24/2006  ? Personal history of chemotherapy   ? Personal history of radiation therapy   ? Pneumonia 1979  ? varicella  ? PONV  (postoperative nausea and vomiting) 1979  ?  ?Family History  ?Problem Relation Age of Onset  ? Hypertension Mother   ? Heart disease Mother   ?     d. 22  ? Parkinsonism Father   ?     d. 58  ? Heart disease Sister   ? Breast cancer Sister 28  ?     s/p mastectomy; reportedly negative BRCA1/2 testing approx 7 years ago  ? Melanoma Sister   ?     d. 24s; hx of incresed sun exposure  ?  Cancer Sister   ?     dx. sarcoma of her face in her 39s; treated w/ MOHS surgery  ? Parkinsonism Paternal Aunt   ?     d. 90s  ? Parkinsonism Paternal Uncle   ?     d. late 20s  ? Kidney failure Maternal Grandmother 46  ? Heart attack Maternal Grandfather   ?     d. 30s  ? Heart disease Paternal Grandmother   ?     d. 56  ? Breast cancer Other 12  ?     maternal great aunt (MGF's sister)  ? Kidney cancer Paternal Uncle 22  ?     d. 70s; unsure about smoking status  ?  ?Past Surgical History:  ?Procedure Laterality Date  ? BLADDER HYPEREXTENSION    ? X4, prior to 01/2006  ? BLADDER SUSPENSION    ? BREAST BIOPSY    ? BREAST LUMPECTOMY Right   ? 2017  ? BREAST LUMPECTOMY WITH NEEDLE LOCALIZATION AND AXILLARY LYMPH NODE DISSECTION Right 12/05/2015  ? Procedure: RIGHT BREAST NEEDLE LOCALIZED (WIRES X 2) LUMPECTOMY WITH AXILLARY LYMPH NODE DISSECTION;  Surgeon: Fanny Skates, MD;  Location: [place];  Service: General;  Laterality: Right;  ? BREAST LUMPECTOMY WITH RADIOACTIVE SEED LOCALIZATION Left 12/05/2015  ? Procedure: RADIOACTIVE SEED GUIDED LEFT BREAST LUMPECTOMY;  Surgeon: Fanny Skates, MD;  Location: [place];  Service: General;  Laterality: Left;  ? BREAST SURGERY    ? BUNIONECTOMY    ? prior to 01/2006  ? CARDIAC CATHETERIZATION    ? [place]  ? CHOLECYSTECTOMY  02/08/2006  ? DR. INGRAM  ? COLONOSCOPY    ? CORONARY STENT INTERVENTION N/A 01/01/2017  ? Procedure: CORONARY STENT INTERVENTION;  Surgeon: Jettie Booze, MD;  Location: [place] [facility];  Service: Cardiovascular;  Laterality: N/A;  ? DENTAL SURGERY   2009-2016  ? 13 dental implants  ? LEFT HEART CATH AND CORONARY ANGIOGRAPHY N/A 01/01/2017  ? Procedure: LEFT HEART CATH AND CORONARY ANGIOGRAPHY;  Surgeon: Jettie Booze, MD;  Location: [place] [facility];  Service: Card

## 2021-08-22 ENCOUNTER — Telehealth: Payer: Self-pay | Admitting: Physical Medicine and Rehabilitation

## 2021-08-22 NOTE — Telephone Encounter (Signed)
Pt called to set an appt for injection. Please call pt at [phone]. ?

## 2021-08-29 ENCOUNTER — Encounter: Payer: Self-pay | Admitting: Physical Medicine and Rehabilitation

## 2021-08-29 ENCOUNTER — Ambulatory Visit (INDEPENDENT_AMBULATORY_CARE_PROVIDER_SITE_OTHER): Payer: Medicare Other | Admitting: Physical Medicine and Rehabilitation

## 2021-08-29 ENCOUNTER — Ambulatory Visit: Payer: Self-pay

## 2021-08-29 VITALS — BP 161/77 | HR 80

## 2021-08-29 DIAGNOSIS — M5416 Radiculopathy, lumbar region: Secondary | ICD-10-CM | POA: Diagnosis not present

## 2021-08-29 MED ORDER — METHYLPREDNISOLONE ACETATE 80 MG/ML IJ SUSP
80.0000 mg | Freq: Once | INTRAMUSCULAR | Status: AC
  Administered 2021-08-29: 80 mg

## 2021-08-29 NOTE — Patient Instructions (Signed)

## 2021-08-29 NOTE — Progress Notes (Signed)
Pt state lower back pain that travels down both legs. Pt state when getting up out of bed or a sitting position the pain gets worse. Pt state walking makes the pain worse. Pt state she uses heat and pain meds to help ease her pain.  Numeric Pain Rating Scale and Functional Assessment Average Pain 4   In the last MONTH (on 0-10 scale) has pain interfered with the following?  1. General activity like being  able to carry out your everyday physical activities such as walking, climbing stairs, carrying groceries, or moving a chair?  Rating(8)   +Driver, +BT, -Dye Allergies.

## 2021-09-18 NOTE — Progress Notes (Signed)
[name] - 76 y.o. female MRN [id number]  Date of birth: 1945/07/21  Office Visit Note: Visit Date: 08/29/2021 PCP: Seward Carol, MD Referred by: Seward Carol, MD  Subjective: Chief Complaint  Patient presents with   Lower Back - Pain   Right Leg - Pain   Left Leg - Pain   HPI:  [name] is a 76 y.o. female who comes in today for planned repeat Bilateral L5-S1  Lumbar Transforaminal epidural steroid injection with fluoroscopic guidance.  The patient has failed conservative care including home exercise, medications, time and activity modification.  This injection will be diagnostic and hopefully therapeutic.  Please see requesting physician notes for further details and justification. Patient received more than 50% pain relief from prior injection.  She will typically get between 3 to 6 months of relief of her back and hip and leg pain.  More recently we had completed L5 transforaminal injections into the worsening facet arthropathy and subarticular lateral recess narrowing.  Still no high-grade stenosis no nerve compression.  Patient's case is complicated by a long history of fibromyalgia and now with adjustment disorder status post death of her husband.  Referring: Dr. Meridee Score  ROS Otherwise per HPI.  Assessment & Plan: Visit Diagnoses:    ICD-10-CM   1. Lumbar radiculopathy  M54.16 XR C-ARM NO REPORT    Epidural Steroid injection    methylPREDNISolone acetate (DEPO-MEDROL) injection 80 mg      Plan: No additional findings.   Meds & Orders:  Meds ordered this encounter  Medications   methylPREDNISolone acetate (DEPO-MEDROL) injection 80 mg    Orders Placed This Encounter  Procedures   XR C-ARM NO REPORT   Epidural Steroid injection    Follow-up: Return if symptoms worsen or fail to improve.   Procedures: No procedures performed  Lumbosacral Transforaminal Epidural Steroid Injection - Sub-Pedicular Approach with Fluoroscopic Guidance  Patient:  [name]      Date of Birth: 06-Oct-1945 MRN: [id number] PCP: Seward Carol, MD      Visit Date: 08/29/2021   Universal Protocol:    Date/Time: 08/29/2021  Consent Given By: the patient  Position: PRONE  Additional Comments: Vital signs were monitored before and after the procedure. Patient was prepped and draped in the usual sterile fashion. The correct patient, procedure, and site was verified.   Injection Procedure Details:   Procedure diagnoses: Lumbar radiculopathy [M54.16]    Meds Administered:  Meds ordered this encounter  Medications   methylPREDNISolone acetate (DEPO-MEDROL) injection 80 mg    Laterality: Bilateral  Location/Site: L5  Needle:5.0 in., 22 ga.  Short bevel or Quincke spinal needle  Needle Placement: Transforaminal  Findings:    -Comments: Excellent flow of contrast along the nerve, nerve root and into the epidural space.  Procedure Details: After squaring off the end-plates to get a true AP view, the C-arm was positioned so that an oblique view of the foramen as noted above was visualized. The target area is just inferior to the "nose of the scotty dog" or sub pedicular. The soft tissues overlying this structure were infiltrated with 2-3 ml. of 1% Lidocaine without Epinephrine.  The spinal needle was inserted toward the target using a "trajectory" view along the fluoroscope beam.  Under AP and lateral visualization, the needle was advanced so it did not puncture dura and was located close the 6 O'Clock position of the pedical in AP tracterory. Biplanar projections were used to confirm position. Aspiration was confirmed to  be negative for CSF and/or blood. A 1-2 ml. volume of Isovue-250 was injected and flow of contrast was noted at each level. Radiographs were obtained for documentation purposes.   After attaining the desired flow of contrast documented above, a 0.5 to 1.0 ml test dose of 0.25% Marcaine was injected into each respective  transforaminal space.  The patient was observed for 90 seconds post injection.  After no sensory deficits were reported, and normal lower extremity motor function was noted,   the above injectate was administered so that equal amounts of the injectate were placed at each foramen (level) into the transforaminal epidural space.   Additional Comments:  The patient tolerated the procedure well Dressing: 2 x 2 sterile gauze and Band-Aid    Post-procedure details: Patient was observed during the procedure. Post-procedure instructions were reviewed.  Patient left the clinic in stable condition.    Clinical History: MRI LUMBAR SPINE WITHOUT CONTRAST     TECHNIQUE:  Multiplanar, multisequence MR imaging of the lumbar spine was  performed. No intravenous contrast was administered.     COMPARISON:  01/29/2014     FINDINGS:  Segmentation:  5 lumbar type vertebrae     Alignment: Levoscoliosis. Grade 1 anterolisthesis at L4-5. Mild  retrolisthesis at L1-2 and L2-3. Misalignment has progressed from  2015.     Vertebrae:  No fracture, evidence of discitis, or bone lesion.     Conus medullaris and cauda equina: Conus extends to the L1-2 level.  Conus and cauda equina appear normal. 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     Disc levels:     T12- L1: Disc narrowing and ventral spurring.  No impingement     L1-L2: Rightward disc collapse with far-lateral bulging and ridging.  Asymmetric right facet spurring. Moderate right foraminal narrowing.  Noncompressive right subarticular recess stenosis.     L2-L3: Disc narrowing and bulging with retrolisthesis. Mild facet  spurring. Mild to moderate left foraminal narrowing.     L3-L4: Disc narrowing and left eccentric bulging. Degenerative facet  spurring asymmetric to the left. Mild left subarticular recess  stenosis.     L4-L5: Facet osteoarthritis with spurring  and anterolisthesis.  Moderate disc space narrowing with mild bulging. Bilateral root  sleeve cysts. Mild to moderate spinal stenosis with asymmetric left  subarticular recess narrowing     L5-S1:Disc narrowing and minor bulging. Mild facet spurring. No  neural compression.     IMPRESSION:  1. 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  2. Mild to moderate foraminal narrowing on the right at L1-2 and  left at L2-3.  3. Mild to moderate spinal stenosis at L4-5 with asymmetric left  subarticular recess narrowing.        Electronically Signed    By: Monte Fantasia M.D.    On: 05/08/2020 07:15     Objective:  VS:  HT:    WT:   BMI:     BP:(!) 161/77  HR:80bpm  TEMP: ( )  RESP:  Physical Exam Vitals and nursing note reviewed.  Constitutional:      General: She is not in acute distress.    Appearance: Normal appearance. She is not ill-appearing.  HENT:     Head: Normocephalic and atraumatic.     Right Ear: External ear normal.     Left Ear: External ear normal.  Eyes:     Extraocular Movements: Extraocular movements intact.  Cardiovascular:     Rate and Rhythm: Normal rate.     Pulses: Normal pulses.  Pulmonary:     Effort: Pulmonary effort is normal. No respiratory distress.  Abdominal:     General: There is no distension.     Palpations: Abdomen is soft.  Musculoskeletal:        General: Tenderness present.     Cervical back: Neck supple.     Right lower leg: No edema.     Left lower leg: No edema.     Comments: Patient has good distal strength with no pain over the greater trochanters.  No clonus or focal weakness.  Skin:    Findings: No erythema, lesion or rash.  Neurological:     General: No focal deficit present.     Mental Status: She is alert and oriented to person, place, and time.     Sensory: No sensory deficit.     Motor: No weakness or abnormal muscle tone.     Coordination: Coordination  normal.  Psychiatric:        Mood and Affect: Mood normal.        Behavior: Behavior normal.     Imaging: No results found.

## 2021-09-18 NOTE — Procedures (Signed)
Lumbosacral Transforaminal Epidural Steroid Injection - Sub-Pedicular Approach with Fluoroscopic Guidance  Patient: [name]      Date of Birth: 11-Nov-1945 MRN: [id number] PCP: Seward Carol, MD      Visit Date: 08/29/2021   Universal Protocol:    Date/Time: 08/29/2021  Consent Given By: the patient  Position: PRONE  Additional Comments: Vital signs were monitored before and after the procedure. Patient was prepped and draped in the usual sterile fashion. The correct patient, procedure, and site was verified.   Injection Procedure Details:   Procedure diagnoses: Lumbar radiculopathy [M54.16]    Meds Administered:  Meds ordered this encounter  Medications   methylPREDNISolone acetate (DEPO-MEDROL) injection 80 mg    Laterality: Bilateral  Location/Site: L5  Needle:5.0 in., 22 ga.  Short bevel or Quincke spinal needle  Needle Placement: Transforaminal  Findings:    -Comments: Excellent flow of contrast along the nerve, nerve root and into the epidural space.  Procedure Details: After squaring off the end-plates to get a true AP view, the C-arm was positioned so that an oblique view of the foramen as noted above was visualized. The target area is just inferior to the "nose of the scotty dog" or sub pedicular. The soft tissues overlying this structure were infiltrated with 2-3 ml. of 1% Lidocaine without Epinephrine.  The spinal needle was inserted toward the target using a "trajectory" view along the fluoroscope beam.  Under AP and lateral visualization, the needle was advanced so it did not puncture dura and was located close the 6 O'Clock position of the pedical in AP tracterory. Biplanar projections were used to confirm position. Aspiration was confirmed to be negative for CSF and/or blood. A 1-2 ml. volume of Isovue-250 was injected and flow of contrast was noted at each level. Radiographs were obtained for documentation purposes.   After attaining the desired  flow of contrast documented above, a 0.5 to 1.0 ml test dose of 0.25% Marcaine was injected into each respective transforaminal space.  The patient was observed for 90 seconds post injection.  After no sensory deficits were reported, and normal lower extremity motor function was noted,   the above injectate was administered so that equal amounts of the injectate were placed at each foramen (level) into the transforaminal epidural space.   Additional Comments:  The patient tolerated the procedure well Dressing: 2 x 2 sterile gauze and Band-Aid    Post-procedure details: Patient was observed during the procedure. Post-procedure instructions were reviewed.  Patient left the clinic in stable condition.

## 2021-09-23 ENCOUNTER — Telehealth: Payer: Medicare Other | Admitting: Physician Assistant

## 2021-09-23 DIAGNOSIS — R051 Acute cough: Secondary | ICD-10-CM

## 2021-09-23 MED ORDER — DOXYCYCLINE HYCLATE 100 MG PO TABS: 100.0000 mg | ORAL_TABLET | Freq: Two times a day (BID) | ORAL | 0 refills | Status: DC

## 2021-09-23 MED ORDER — BENZONATATE 200 MG PO CAPS: 200.0000 mg | ORAL_CAPSULE | Freq: Three times a day (TID) | ORAL | 0 refills | Status: DC | PRN

## 2021-09-23 NOTE — Progress Notes (Deleted)
Virtual Visit Consent   [name], you are scheduled for a virtual visit with a [facility] provider today. Just as with appointments in the office, your consent must be obtained to participate. Your consent will be active for this visit and any virtual visit you may have with one of our providers in the next 365 days. If you have a MyChart account, a copy of this consent can be sent to you electronically.  As this is a virtual visit, video technology does not allow for your provider to perform a traditional examination. This may limit your provider's ability to fully assess your condition. If your provider identifies any concerns that need to be evaluated in person or the need to arrange testing (such as labs, EKG, etc.), we will make arrangements to do so. Although advances in technology are sophisticated, we cannot ensure that it will always work on either your end or our end. If the connection with a video visit is poor, the visit may have to be switched to a telephone visit. With either a video or telephone visit, we are not always able to ensure that we have a secure connection.  By engaging in this virtual visit, you consent to the provision of healthcare and authorize for your insurance to be billed (if applicable) for the services provided during this visit. Depending on your insurance coverage, you may receive a charge related to this service.  I need to obtain your verbal consent now. Are you willing to proceed with your visit today? [name] has provided verbal consent on 09/23/2021 for a virtual visit (video or telephone). [name], [place]  Date: 09/23/2021 10:34 AM  Virtual Visit via Video Note   I, [name], connected with  [name]  ([id number], 1945-06-14) on 09/23/21 at 10:45 AM EDT by a video-enabled telemedicine application and verified that I am speaking with the correct person using two identifiers.  Location: Patient: {Virtual Visit Location  Patient:[id number]::"Home"} Provider: {Virtual Visit Location Provider:[id number]::"Office/Clinic"}   I discussed the limitations of evaluation and management by telemedicine and the availability of in person appointments. The patient expressed understanding and agreed to proceed.    History of Present Illness: [name] is a 76 y.o. who identifies as a female who was assigned female at birth, and is being seen today for ***.  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   Status post coronary artery stent placement    Non-ST elevation (NSTEMI) myocardial infarction [facility])    Depression with anxiety 12/31/2016   Chest pain 12/31/2016   Hyponatremia 12/31/2016   Chronic pain 12/31/2016   Essential hypertension 12/31/2016   Unstable angina ([place])    Family history of breast cancer in female 02/08/2016   Rash and nonspecific skin eruption 10/03/2015   Sensorineural hearing loss (SNHL) of both ears 09/05/2015   Malignant neoplasm of upper-outer quadrant of right breast in female, estrogen receptor positive ([place]) 09/01/2015   Sclerosing adenosis of left breast 09/01/2015   Asthma 09/12/2011   Menopausal symptoms    Chronic vaginitis    Fibromyalgia    Interstitial cystitis    UNSPECIFIED HYPOTHYROIDISM 06/14/2009   DEPRESSIVE  DISORDER NOT ELSEWHERE CLASSIFIED 06/14/2009   Hereditary and idiopathic peripheral neuropathy 06/14/2009   UNSPEC HEMORRHOIDS WITHOUT MENTION COMPLICATION 17/40/8144   ALLERGIC RHINITIS CAUSE UNSPECIFIED 06/14/2009   ESOPHAGEAL REFLUX 06/14/2009   Irritable bowel syndrome 06/14/2009   INSOMNIA UNSPECIFIED 06/14/2009    Osteoporosis 08/27/2007    Allergies:  Allergies  Allergen Reactions   Chlorhexidine Other (See Comments)    [place] IS OK. The CLOTH WIPE causes reaction. Causes itching all over the area   Other Rash    Adhesive tape   Medications:  Current Outpatient Medications:    albuterol (PROVENTIL HFA;VENTOLIN HFA) 108 (90 Base) MCG/ACT inhaler, Inhale 1-2 puffs into the lungs every 6 (six) hours as needed for wheezing or shortness of breath. Started in 1998, Disp: , Rfl:    anastrozole (ARIMIDEX) 1 MG tablet, Take 1 tablet (1 mg total) by mouth daily., Disp: 90 tablet, Rfl: 4   BRILINTA 90 MG TABS tablet, TAKE 1 TABLET BY MOUTH TWICE DAILY., Disp: 60 tablet, Rfl: 11   buPROPion (WELLBUTRIN XL) 300 MG 24 hr tablet, Take 300 mg by mouth daily. , Disp: , Rfl:    Calcium Carbonate-Vitamin D (CALCIUM + D PO), Take 1 tablet by mouth at bedtime. Started approximately 1998, Disp: , Rfl:    carisoprodol (SOMA) 350 MG tablet, Take 350 mg by mouth 2 (two) times daily as needed for muscle spasms. Started in 2008., Disp: , Rfl: 2   cephALEXin (KEFLEX) 500 MG capsule, Take 1 capsule (500 mg total) by mouth 2 (two) times daily., Disp: 20 capsule, Rfl: 0   diazepam (VALIUM) 5 MG tablet, Take 5 mg by mouth every 6 (six) hours as needed for muscle spasms (interstitial cystitis). Started approximately 2008., Disp: , Rfl:    furosemide (LASIX) 20 MG tablet, TAKE 1 TABLET(20 MG) BY MOUTH DAILY AS NEEDED FOR SHORTNESS OF BREATH, Disp: 30 tablet, Rfl: 10   gabapentin (NEURONTIN) 300 MG capsule, TAKE 3 TO 4 CAPSULES(900 TO 1200 MG) BY MOUTH AT BEDTIME, Disp: 120 capsule, Rfl: 4   HYDROcodone-acetaminophen (NORCO) 10-325 MG tablet, Take 1 tablet by mouth every 4 (four) hours as needed for moderate pain or severe pain (as needed for fibromyalgia and IC). , Disp: , Rfl:    hydrOXYzine (ATARAX/VISTARIL) 25 MG tablet, Take 1 tablet by mouth at bedtime., Disp: , Rfl:    levothyroxine (SYNTHROID, LEVOTHROID) 75 MCG tablet, Take 75  mcg by mouth daily., Disp: , Rfl: 2   LORazepam (ATIVAN) 2 MG tablet, Take 1 tablet by mouth daily as needed for anxiety. , Disp: , Rfl: 5   nitroGLYCERIN (NITROSTAT) 0.4 MG SL tablet, For chest pain, place 1 tablet under tongue and let dissolve, wait 5 mins if still having chest pain you may repeat this 2 more times. If still having chest pain call 911 or have someone drive you to the closest ER., Disp: 25 tablet, Rfl: 1   NONFORMULARY OR COMPOUNDED ITEM, Vit E vaginal suppositories '200mg'$ /ml.  One pv three times weekly.  #36/4RF, Disp: 36 each, Rfl: 4   ondansetron (ZOFRAN) 4 MG tablet, Take 1-2 tablets (4-8 mg total) by mouth every 8 (eight) hours as needed for nausea or vomiting., Disp: 60 tablet, Rfl: 4   ondansetron (ZOFRAN) 8 MG tablet, TAKE 1 TABLET EVERY 8 HOURS AS NEEDED FOR NAUSEA AND VOMITING., Disp: 20 tablet, Rfl: 1   pantoprazole (PROTONIX) 40 MG tablet, Take 40 mg by mouth daily. Started in 2012., Disp: , Rfl:    potassium chloride (  KLOR-CON) 10 MEQ tablet, TAKE 1 TABLET BY MOUTH AS NEEDED. TAKE WITH LASIX, Disp: 30 tablet, Rfl: 10   predniSONE (DELTASONE) 10 MG tablet, Take 1 tablet (10 mg total) by mouth daily with breakfast., Disp: 30 tablet, Rfl: 0   prochlorperazine (COMPAZINE) 10 MG tablet, Take 1 tablet (10 mg total) by mouth every 6 (six) hours as needed for nausea or vomiting., Disp: 385 tablet, Rfl: 1   promethazine (PHENERGAN) 25 MG tablet, Take 0.5-1 tablets (12.5-25 mg total) by mouth every 6 (six) hours as needed for nausea or vomiting., Disp: 90 tablet, Rfl: 4   rosuvastatin (CRESTOR) 40 MG tablet, TAKE 1 TABLET(40 MG) BY MOUTH DAILY, Disp: 90 tablet, Rfl: 3   Vaginal Lubricant (REPLENS VA), Place vaginally daily., Disp: , Rfl:    Vitamin D, Ergocalciferol, (DRISDOL) 1.25 MG (50000 UNIT) CAPS capsule, Take 1 capsule (50,000 Units total) by mouth every 7 (seven) days., Disp: 12 capsule, Rfl: 2   zolpidem (AMBIEN) 10 MG tablet, Take 10 mg by mouth at bedtime as needed for  sleep., Disp: , Rfl:   Observations/Objective: Patient is well-developed, well-nourished in no acute distress.  Resting comfortably *** at home.  Head is normocephalic, atraumatic.  No labored breathing. *** Speech is clear and coherent with logical content.  Patient is alert and oriented at baseline.  ***  Assessment and Plan: There are no diagnoses linked to this encounter. ***  Follow Up Instructions: I discussed the assessment and treatment plan with the patient. The patient was provided an opportunity to ask questions and all were answered. The patient agreed with the plan and demonstrated an understanding of the instructions.  A copy of instructions were sent to the patient via MyChart unless otherwise noted below.   {EMAIL BDZ:[id number]::"[facility] has requested to receive PHI (AVS, Work Notes, etc) pertaining to this video visit through e-mail as they are currently without active MyChart. They have voiced understand that email is not considered secure and their health information could be viewed by someone other than the patient. "}  The patient was advised to call back or seek an in-person evaluation if the symptoms worsen or if the condition fails to improve as anticipated.  Time:  I spent *** minutes with the patient via telehealth technology discussing the above problems/concerns.    [name], [place]

## 2021-09-23 NOTE — Progress Notes (Signed)
Virtual Visit Consent   TARAJI MUNGO, you are scheduled for a virtual visit with a [facility] provider today. Just as with appointments in the office, your consent must be obtained to participate. Your consent will be active for this visit and any virtual visit you may have with one of our providers in the next 365 days. If you have a MyChart account, a copy of this consent can be sent to you electronically.  As this is a virtual visit, video technology does not allow for your provider to perform a traditional examination. This may limit your provider's ability to fully assess your condition. If your provider identifies any concerns that need to be evaluated in person or the need to arrange testing (such as labs, EKG, etc.), we will make arrangements to do so. Although advances in technology are sophisticated, we cannot ensure that it will always work on either your end or our end. If the connection with a video visit is poor, the visit may have to be switched to a telephone visit. With either a video or telephone visit, we are not always able to ensure that we have a secure connection.  By engaging in this virtual visit, you consent to the provision of healthcare and authorize for your insurance to be billed (if applicable) for the services provided during this visit. Depending on your insurance coverage, you may receive a charge related to this service.  I need to obtain your verbal consent now. Are you willing to proceed with your visit today? MADINA GALATI has provided verbal consent on 09/23/2021 for a virtual visit (video or telephone). Inda Coke, [place]  Date: 09/23/2021 10:52 AM  Virtual Visit via Video Note   I, Inda Coke, connected with  CHANIN FRUMKIN  ([id number], March 17, 1946) on 09/23/21 at 10:45 AM EDT by a video-enabled telemedicine application and verified that I am speaking with the correct person using two identifiers.  Location: Patient: Virtual Visit Location Patient:  Home Provider: Virtual Visit Location Provider: Home Office   I discussed the limitations of evaluation and management by telemedicine and the availability of in person appointments. The patient expressed understanding and agreed to proceed.    History of Present Illness: FLORINA GLAS is a 76 y.o. who identifies as a female who was assigned female at birth, and is being seen today for cough.  She recently traveled to [place] -- she wore a mask but feels like she may have picked up a virus while traveling. Symptoms started Wednesday. COVID test negative on Wednesday. Has had body aches with this. Has tried cough drops, tea, soup, hydration. Has an albuterol inhaler for asthma but has not needed it -- she denies SOB or wheezing, chest pain, LE swelling   Problems:  Patient Active Problem List   Diagnosis Date Noted   Lumbar radiculopathy 05/11/2019   Bilateral stenosis of lateral recess of lumbar spine 05/11/2019   Hyperlipidemia 12/23/2018   Chronic diastolic CHF (congestive heart failure) ([place]) 11/24/2018   Abnormal transaminases 03/24/2018   CAD (coronary artery disease) 02/11/2017   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   Status post coronary artery stent placement    Non-ST elevation (NSTEMI) myocardial infarction [facility])    Depression with anxiety 12/31/2016   Chest pain 12/31/2016   Hyponatremia 12/31/2016   Chronic pain 12/31/2016   Essential hypertension 12/31/2016   Unstable angina ([place])    Family history of breast cancer in female 02/08/2016   Rash and nonspecific skin eruption 10/03/2015  Sensorineural hearing loss (SNHL) of both ears 09/05/2015   Malignant neoplasm of upper-outer quadrant of right breast in female, estrogen receptor positive ([place]) 09/01/2015   Sclerosing adenosis of left breast 09/01/2015   Asthma 09/12/2011   Menopausal symptoms    Chronic vaginitis    Fibromyalgia    Interstitial cystitis    UNSPECIFIED HYPOTHYROIDISM 06/14/2009   DEPRESSIVE  DISORDER NOT ELSEWHERE CLASSIFIED 06/14/2009   Hereditary and idiopathic peripheral neuropathy 06/14/2009   UNSPEC HEMORRHOIDS WITHOUT MENTION COMPLICATION 61/95/0932   ALLERGIC RHINITIS CAUSE UNSPECIFIED 06/14/2009   ESOPHAGEAL REFLUX 06/14/2009   Irritable bowel syndrome 06/14/2009   INSOMNIA UNSPECIFIED 06/14/2009   Osteoporosis 08/27/2007    Allergies:  Allergies  Allergen Reactions   Chlorhexidine Other (See Comments)    [place] IS OK. The CLOTH WIPE causes reaction. Causes itching all over the area   Other Rash    Adhesive tape   Medications:  Current Outpatient Medications:    benzonatate (TESSALON) 200 MG capsule, Take 1 capsule (200 mg total) by mouth 3 (three) times daily as needed for cough., Disp: 30 capsule, Rfl: 0   doxycycline (VIBRA-TABS) 100 MG tablet, Take 1 tablet (100 mg total) by mouth 2 (two) times daily., Disp: 14 tablet, Rfl: 0   albuterol (PROVENTIL HFA;VENTOLIN HFA) 108 (90 Base) MCG/ACT inhaler, Inhale 1-2 puffs into the lungs every 6 (six) hours as needed for wheezing or shortness of breath. Started in 1998, Disp: , Rfl:    anastrozole (ARIMIDEX) 1 MG tablet, Take 1 tablet (1 mg total) by mouth daily., Disp: 90 tablet, Rfl: 4   BRILINTA 90 MG TABS tablet, TAKE 1 TABLET BY MOUTH TWICE DAILY., Disp: 60 tablet, Rfl: 11   buPROPion (WELLBUTRIN XL) 300 MG 24 hr tablet, Take 300 mg by mouth daily. , Disp: , Rfl:    Calcium Carbonate-Vitamin D (CALCIUM + D PO), Take 1 tablet by mouth at bedtime. Started approximately 1998, Disp: , Rfl:    carisoprodol (SOMA) 350 MG tablet, Take 350 mg by mouth 2 (two) times daily as needed for muscle spasms. Started in 2008., Disp: , Rfl: 2   diazepam (VALIUM) 5 MG tablet, Take 5 mg by mouth every 6 (six) hours as needed for muscle spasms (interstitial cystitis). Started approximately 2008., Disp: , Rfl:    furosemide (LASIX) 20 MG tablet, TAKE 1 TABLET(20 MG) BY MOUTH DAILY AS NEEDED FOR SHORTNESS OF BREATH, Disp: 30 tablet, Rfl: 10    gabapentin (NEURONTIN) 300 MG capsule, TAKE 3 TO 4 CAPSULES(900 TO 1200 MG) BY MOUTH AT BEDTIME, Disp: 120 capsule, Rfl: 4   HYDROcodone-acetaminophen (NORCO) 10-325 MG tablet, Take 1 tablet by mouth every 4 (four) hours as needed for moderate pain or severe pain (as needed for fibromyalgia and IC). , Disp: , Rfl:    hydrOXYzine (ATARAX/VISTARIL) 25 MG tablet, Take 1 tablet by mouth at bedtime., Disp: , Rfl:    levothyroxine (SYNTHROID, LEVOTHROID) 75 MCG tablet, Take 75 mcg by mouth daily., Disp: , Rfl: 2   LORazepam (ATIVAN) 2 MG tablet, Take 1 tablet by mouth daily as needed for anxiety. , Disp: , Rfl: 5   nitroGLYCERIN (NITROSTAT) 0.4 MG SL tablet, For chest pain, place 1 tablet under tongue and let dissolve, wait 5 mins if still having chest pain you may repeat this 2 more times. If still having chest pain call 911 or have someone drive you to the closest ER., Disp: 25 tablet, Rfl: 1   NONFORMULARY OR COMPOUNDED ITEM, Vit E vaginal suppositories  $'200mg'O$ /ml.  One pv three times weekly.  #36/4RF, Disp: 36 each, Rfl: 4   ondansetron (ZOFRAN) 4 MG tablet, Take 1-2 tablets (4-8 mg total) by mouth every 8 (eight) hours as needed for nausea or vomiting., Disp: 60 tablet, Rfl: 4   ondansetron (ZOFRAN) 8 MG tablet, TAKE 1 TABLET EVERY 8 HOURS AS NEEDED FOR NAUSEA AND VOMITING., Disp: 20 tablet, Rfl: 1   pantoprazole (PROTONIX) 40 MG tablet, Take 40 mg by mouth daily. Started in 2012., Disp: , Rfl:    potassium chloride (KLOR-CON) 10 MEQ tablet, TAKE 1 TABLET BY MOUTH AS NEEDED. TAKE WITH LASIX, Disp: 30 tablet, Rfl: 10   predniSONE (DELTASONE) 10 MG tablet, Take 1 tablet (10 mg total) by mouth daily with breakfast., Disp: 30 tablet, Rfl: 0   prochlorperazine (COMPAZINE) 10 MG tablet, Take 1 tablet (10 mg total) by mouth every 6 (six) hours as needed for nausea or vomiting., Disp: 385 tablet, Rfl: 1   promethazine (PHENERGAN) 25 MG tablet, Take 0.5-1 tablets (12.5-25 mg total) by mouth every 6 (six) hours as  needed for nausea or vomiting., Disp: 90 tablet, Rfl: 4   rosuvastatin (CRESTOR) 40 MG tablet, TAKE 1 TABLET(40 MG) BY MOUTH DAILY, Disp: 90 tablet, Rfl: 3   Vaginal Lubricant (REPLENS VA), Place vaginally daily., Disp: , Rfl:    Vitamin D, Ergocalciferol, (DRISDOL) 1.25 MG (50000 UNIT) CAPS capsule, Take 1 capsule (50,000 Units total) by mouth every 7 (seven) days., Disp: 12 capsule, Rfl: 2   zolpidem (AMBIEN) 10 MG tablet, Take 10 mg by mouth at bedtime as needed for sleep., Disp: , Rfl:   Observations/Objective: Patient is well-developed, well-nourished in no acute distress.  Resting comfortably  at home.  Head is normocephalic, atraumatic.  No labored breathing.  Speech is clear and coherent with logical content.  Patient is alert and oriented at baseline.   Assessment and Plan: 1. Acute cough No red flags on discussion. Will initiate doxycycline and tessalon perles per orders. Discussed taking medications as prescribed. Reviewed return precautions including worsening fever, SOB, worsening cough or other concerns. Push fluids and rest. I recommend that patient follow-up if symptoms worsen or persist despite treatment x 7-10 days, sooner if needed.   Follow Up Instructions: I discussed the assessment and treatment plan with the patient. The patient was provided an opportunity to ask questions and all were answered. The patient agreed with the plan and demonstrated an understanding of the instructions.  A copy of instructions were sent to the patient via MyChart unless otherwise noted below.   The patient was advised to call back or seek an in-person evaluation if the symptoms worsen or if the condition fails to improve as anticipated.  Time:  I spent 15 minutes with the patient via telehealth technology discussing the above problems/concerns.    Inda Coke, [place]

## 2021-11-08 ENCOUNTER — Telehealth: Payer: Self-pay

## 2021-11-08 NOTE — Telephone Encounter (Signed)
Pt wanting repeat inj of bil L5 Tf she had on 08/29/21 that helped for a month with relief more than 50%, same pain, side and no new injury. Please advise

## 2021-11-09 ENCOUNTER — Telehealth: Payer: Self-pay | Admitting: Physical Medicine and Rehabilitation

## 2021-11-09 NOTE — Telephone Encounter (Signed)
Pt called requesting a call back to set an appt. Please call pt at [phone]

## 2021-11-15 ENCOUNTER — Encounter: Payer: Self-pay | Admitting: Physical Medicine and Rehabilitation

## 2021-11-15 ENCOUNTER — Ambulatory Visit (INDEPENDENT_AMBULATORY_CARE_PROVIDER_SITE_OTHER): Payer: Medicare Other | Admitting: Physical Medicine and Rehabilitation

## 2021-11-15 ENCOUNTER — Ambulatory Visit: Payer: Self-pay

## 2021-11-15 DIAGNOSIS — M5416 Radiculopathy, lumbar region: Secondary | ICD-10-CM

## 2021-11-15 MED ORDER — METHYLPREDNISOLONE ACETATE 80 MG/ML IJ SUSP
80.0000 mg | Freq: Once | INTRAMUSCULAR | Status: AC
  Administered 2021-11-15: 80 mg

## 2021-11-15 NOTE — Progress Notes (Signed)
Pt state lower back pain that travels down both legs. Pt state when getting up out of bed or a sitting position the pain gets worse. Pt state walking makes the pain worse. Pt state she uses heat and pain meds to help ease her pain.  Numeric Pain Rating Scale and Functional Assessment Average Pain 7   In the last MONTH (on 0-10 scale) has pain interfered with the following?  1. General activity like being  able to carry out your everyday physical activities such as walking, climbing stairs, carrying groceries, or moving a chair?  Rating(10)   +Driver, -BT, -Dye Allergies.

## 2021-11-15 NOTE — Patient Instructions (Signed)

## 2021-11-27 ENCOUNTER — Other Ambulatory Visit: Payer: Self-pay

## 2021-11-27 MED ORDER — FUROSEMIDE 20 MG PO TABS: ORAL_TABLET | ORAL | 5 refills | Status: DC

## 2021-11-27 MED ORDER — POTASSIUM CHLORIDE ER 10 MEQ PO TBCR: EXTENDED_RELEASE_TABLET | ORAL | 5 refills | Status: DC

## 2021-11-27 NOTE — Progress Notes (Signed)
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 female MRN [id number]  Date of birth: 1945-07-02  Office Visit Note: Visit Date: 11/15/2021 PCP: Seward Carol, MD Referred by: Seward Carol, MD  Subjective: Chief Complaint  Patient presents with   Lower Back - Pain   Right Leg - Pain   Left Leg - Pain   HPI:  ISZABELLA HEBENSTREIT is a 76 y.o. female who comes in today for planned repeat Bilateral L5-S1  Lumbar Transforaminal epidural steroid injection with fluoroscopic guidance.  The patient has failed conservative care including home exercise, medications, time and activity modification.  This injection will be diagnostic and hopefully therapeutic.  Please see requesting physician notes for further details and justification. Patient received more than 50% pain relief from prior injection.  The last injection did help fairly well gave her probably 70% relief but just has not lasted as long as the previous injections.  She still under a lot of stress from losing her husband last year and dealing with closure of his business.  She does have history of chronic fibromyalgia which may come into play at this as well.  Depending on relief time would look at L4 transforaminal injection at level of narrowing.  Referring: Barnet Pall, FNP   ROS Otherwise per HPI.  Assessment & Plan: Visit Diagnoses:    ICD-10-CM   1. Lumbar radiculopathy  M54.16 XR C-ARM NO REPORT    Epidural Steroid injection    methylPREDNISolone acetate (DEPO-MEDROL) injection 80 mg      Plan: No additional findings.   Meds & Orders:  Meds ordered this encounter  Medications   methylPREDNISolone acetate (DEPO-MEDROL) injection 80 mg    Orders Placed This Encounter  Procedures   XR C-ARM NO REPORT   Epidural Steroid injection    Follow-up: No follow-ups on file.   Procedures: No procedures performed  Lumbosacral Transforaminal Epidural Steroid Injection - Sub-Pedicular Approach with Fluoroscopic Guidance  Patient: DOAA KENDZIERSKI      Date of Birth: June 02, 1945 MRN: [id number] PCP: Seward Carol, MD      Visit Date: 11/15/2021   Universal Protocol:    Date/Time: 11/15/2021  Consent Given By: the patient  Position: PRONE  Additional Comments: Vital signs were monitored before and after the procedure. Patient was prepped and draped in the usual sterile fashion. The correct patient, procedure, and site was verified.   Injection Procedure Details:   Procedure diagnoses: Lumbar radiculopathy [M54.16]    Meds Administered:  Meds ordered this encounter  Medications   methylPREDNISolone acetate (DEPO-MEDROL) injection 80 mg    Laterality: Bilateral  Location/Site: L5  Needle:5.0 in., 22 ga.  Short bevel or Quincke spinal needle  Needle Placement: Transforaminal  Findings:    -Comments: Excellent flow of contrast along the nerve, nerve root and into the epidural space.  Procedure Details: After squaring off the end-plates to get a true AP view, the C-arm was positioned so that an oblique view of the foramen as noted above was visualized. The target area is just inferior to the "nose of the scotty dog" or sub pedicular. The soft tissues overlying this structure were infiltrated with 2-3 ml. of 1% Lidocaine without Epinephrine.  The spinal needle was inserted toward the target using a "trajectory" view along the fluoroscope beam.  Under AP and lateral visualization, the needle was advanced so it did not puncture dura and was located close the 6 O'Clock position of the pedical in AP tracterory. Biplanar projections were used to confirm  position. Aspiration was confirmed to be negative for CSF and/or blood. A 1-2 ml. volume of Isovue-250 was injected and flow of contrast was noted at each level. Radiographs were obtained for documentation purposes.   After attaining the desired flow of contrast documented above, a 0.5 to 1.0 ml test dose of 0.25% Marcaine was injected into each respective transforaminal  space.  The patient was observed for 90 seconds post injection.  After no sensory deficits were reported, and normal lower extremity motor function was noted,   the above injectate was administered so that equal amounts of the injectate were placed at each foramen (level) into the transforaminal epidural space.   Additional Comments:  The patient tolerated the procedure well Dressing: 2 x 2 sterile gauze and Band-Aid    Post-procedure details: Patient was observed during the procedure. Post-procedure instructions were reviewed.  Patient left the clinic in stable condition.    Clinical History: MRI LUMBAR SPINE WITHOUT CONTRAST     TECHNIQUE:  Multiplanar, multisequence MR imaging of the lumbar spine was  performed. No intravenous contrast was administered.     COMPARISON:  01/29/2014     FINDINGS:  Segmentation:  5 lumbar type vertebrae     Alignment: Levoscoliosis. Grade 1 anterolisthesis at L4-5. Mild  retrolisthesis at L1-2 and L2-3. Misalignment has progressed from  2015.     Vertebrae:  No fracture, evidence of discitis, or bone lesion.     Conus medullaris and cauda equina: Conus extends to the L1-2 level.  Conus and cauda equina appear normal. 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     Disc levels:     T12- L1: Disc narrowing and ventral spurring.  No impingement     L1-L2: Rightward disc collapse with far-lateral bulging and ridging.  Asymmetric right facet spurring. Moderate right foraminal narrowing.  Noncompressive right subarticular recess stenosis.     L2-L3: Disc narrowing and bulging with retrolisthesis. Mild facet  spurring. Mild to moderate left foraminal narrowing.     L3-L4: Disc narrowing and left eccentric bulging. Degenerative facet  spurring asymmetric to the left. Mild left subarticular recess  stenosis.     L4-L5: Facet osteoarthritis with spurring and  anterolisthesis.  Moderate disc space narrowing with mild bulging. Bilateral root  sleeve cysts. Mild to moderate spinal stenosis with asymmetric left  subarticular recess narrowing     L5-S1:Disc narrowing and minor bulging. Mild facet spurring. No  neural compression.     IMPRESSION:  1. 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  2. Mild to moderate foraminal narrowing on the right at L1-2 and  left at L2-3.  3. Mild to moderate spinal stenosis at L4-5 with asymmetric left  subarticular recess narrowing.        Electronically Signed    By: Monte Fantasia M.D.    On: 05/08/2020 07:15     Objective:  VS:  HT:    WT:   BMI:     BP:   HR: bpm  TEMP: ( )  RESP:  Physical Exam Vitals and nursing note reviewed.  Constitutional:      General: She is not in acute distress.    Appearance: Normal appearance. She is not ill-appearing.  HENT:     Head: Normocephalic and atraumatic.     Right Ear: External ear normal.  Left Ear: External ear normal.  Eyes:     Extraocular Movements: Extraocular movements intact.  Cardiovascular:     Rate and Rhythm: Normal rate.     Pulses: Normal pulses.  Pulmonary:     Effort: Pulmonary effort is normal. No respiratory distress.  Abdominal:     General: There is no distension.     Palpations: Abdomen is soft.  Musculoskeletal:        General: Tenderness present.     Cervical back: Neck supple.     Right lower leg: No edema.     Left lower leg: No edema.     Comments: Patient has good distal strength with no pain over the greater trochanters.  No clonus or focal weakness.  Skin:    Findings: No erythema, lesion or rash.  Neurological:     General: No focal deficit present.     Mental Status: She is alert and oriented to person, place, and time.     Sensory: No sensory deficit.     Motor: No weakness or abnormal muscle tone.     Coordination: Coordination normal.   Psychiatric:        Mood and Affect: Mood normal.        Behavior: Behavior normal.      Imaging: No results found.

## 2021-11-27 NOTE — Procedures (Signed)
Lumbosacral Transforaminal Epidural Steroid Injection - Sub-Pedicular Approach with Fluoroscopic Guidance  Patient: [name]      Date of Birth: Jun 20, 1945 MRN: [id number] PCP: Seward Carol, MD      Visit Date: 11/15/2021   Universal Protocol:    Date/Time: 11/15/2021  Consent Given By: the patient  Position: PRONE  Additional Comments: Vital signs were monitored before and after the procedure. Patient was prepped and draped in the usual sterile fashion. The correct patient, procedure, and site was verified.   Injection Procedure Details:   Procedure diagnoses: Lumbar radiculopathy [M54.16]    Meds Administered:  Meds ordered this encounter  Medications   methylPREDNISolone acetate (DEPO-MEDROL) injection 80 mg    Laterality: Bilateral  Location/Site: L5  Needle:5.0 in., 22 ga.  Short bevel or Quincke spinal needle  Needle Placement: Transforaminal  Findings:    -Comments: Excellent flow of contrast along the nerve, nerve root and into the epidural space.  Procedure Details: After squaring off the end-plates to get a true AP view, the C-arm was positioned so that an oblique view of the foramen as noted above was visualized. The target area is just inferior to the "nose of the scotty dog" or sub pedicular. The soft tissues overlying this structure were infiltrated with 2-3 ml. of 1% Lidocaine without Epinephrine.  The spinal needle was inserted toward the target using a "trajectory" view along the fluoroscope beam.  Under AP and lateral visualization, the needle was advanced so it did not puncture dura and was located close the 6 O'Clock position of the pedical in AP tracterory. Biplanar projections were used to confirm position. Aspiration was confirmed to be negative for CSF and/or blood. A 1-2 ml. volume of Isovue-250 was injected and flow of contrast was noted at each level. Radiographs were obtained for documentation purposes.   After attaining the desired  flow of contrast documented above, a 0.5 to 1.0 ml test dose of 0.25% Marcaine was injected into each respective transforaminal space.  The patient was observed for 90 seconds post injection.  After no sensory deficits were reported, and normal lower extremity motor function was noted,   the above injectate was administered so that equal amounts of the injectate were placed at each foramen (level) into the transforaminal epidural space.   Additional Comments:  The patient tolerated the procedure well Dressing: 2 x 2 sterile gauze and Band-Aid    Post-procedure details: Patient was observed during the procedure. Post-procedure instructions were reviewed.  Patient left the clinic in stable condition.

## 2022-01-03 ENCOUNTER — Other Ambulatory Visit: Payer: Self-pay | Admitting: *Deleted

## 2022-01-03 ENCOUNTER — Telehealth: Payer: Self-pay | Admitting: Licensed Clinical Social Worker

## 2022-01-03 DIAGNOSIS — C50411 Malignant neoplasm of upper-outer quadrant of right female breast: Secondary | ICD-10-CM

## 2022-01-03 NOTE — Patient Outreach (Signed)
  Care Coordination   01/03/2022 Name: [name] MRN: [id number] DOB: Feb 05, 1946   Care Coordination Outreach Attempts:  An unsuccessful telephone outreach was attempted today to offer the patient information about available care coordination services as a benefit of their health plan.   Follow Up Plan:  Additional outreach attempts will be made to offer the patient care coordination information and services.   Encounter Outcome:  No Answer  Care Coordination Interventions Activated:  No   Care Coordination Interventions:  No, not indicated    Casimer Lanius, [place] [phone]

## 2022-01-03 NOTE — Progress Notes (Signed)
Received call from pt with complaint of right arm swelling x several day.  Pt states in the past she has been diagnosed with right arm cellulitis and treated with Keflex.  Pt denies redness or warmth to the extremity.  Pt denies recent injury or trauma but does state she is currently in the process of moving.  Per MD pt needing labs and [facility] f/u for further evaluation and tx.  Appt scheduled, pt verbalized understanding of appt date and time.

## 2022-01-04 ENCOUNTER — Ambulatory Visit (HOSPITAL_BASED_OUTPATIENT_CLINIC_OR_DEPARTMENT_OTHER)
Admission: RE | Admit: 2022-01-04 | Discharge: 2022-01-04 | Disposition: A | Discharge status: Home / Self Care | Payer: Medicare Other | Source: Ambulatory Visit | Attending: Adult Health | Admitting: Adult Health

## 2022-01-04 ENCOUNTER — Other Ambulatory Visit: Payer: Self-pay

## 2022-01-04 ENCOUNTER — Encounter: Payer: Self-pay | Admitting: Adult Health

## 2022-01-04 ENCOUNTER — Inpatient Hospital Stay: Payer: Medicare Other | Attending: Hematology and Oncology

## 2022-01-04 ENCOUNTER — Inpatient Hospital Stay (HOSPITAL_BASED_OUTPATIENT_CLINIC_OR_DEPARTMENT_OTHER): Payer: Medicare Other | Admitting: Adult Health

## 2022-01-04 VITALS — BP 161/93 | HR 65 | Temp 97.3°F | Resp 16 | Ht 62.0 in | Wt 156.7 lb

## 2022-01-04 DIAGNOSIS — Z170 Estrogen receptor positive status [ER+]: Secondary | ICD-10-CM

## 2022-01-04 DIAGNOSIS — C50411 Malignant neoplasm of upper-outer quadrant of right female breast: Secondary | ICD-10-CM | POA: Insufficient documentation

## 2022-01-04 LAB — CBC WITH DIFFERENTIAL (CANCER CENTER ONLY)
Abs Immature Granulocytes: 0.01 10*3/uL (ref 0.00–0.07)
Basophils Absolute: 0.1 10*3/uL (ref 0.0–0.1)
Basophils Relative: 1 %
Eosinophils Absolute: 0.2 10*3/uL (ref 0.0–0.5)
Eosinophils Relative: 3 %
HCT: 40.1 % (ref 36.0–46.0)
Hemoglobin: 13.7 g/dL (ref 12.0–15.0)
Immature Granulocytes: 0 %
Lymphocytes Relative: 27 %
Lymphs Abs: 1.9 10*3/uL (ref 0.7–4.0)
MCH: 32.2 pg (ref 26.0–34.0)
MCHC: 34.2 g/dL (ref 30.0–36.0)
MCV: 94.1 fL (ref 80.0–100.0)
Monocytes Absolute: 0.7 10*3/uL (ref 0.1–1.0)
Monocytes Relative: 9 %
Neutro Abs: 4.4 10*3/uL (ref 1.7–7.7)
Neutrophils Relative %: 60 %
Platelet Count: 252 10*3/uL (ref 150–400)
RBC: 4.26 MIL/uL (ref 3.87–5.11)
RDW: 14.5 % (ref 11.5–15.5)
WBC Count: 7.2 10*3/uL (ref 4.0–10.5)
nRBC: 0 % (ref 0.0–0.2)

## 2022-01-04 LAB — CMP (CANCER CENTER ONLY)
ALT: 24 U/L (ref 0–44)
AST: 28 U/L (ref 15–41)
Albumin: 4 g/dL (ref 3.5–5.0)
Alkaline Phosphatase: 79 U/L (ref 38–126)
Anion gap: 6 (ref 5–15)
BUN: 15 mg/dL (ref 8–23)
CO2: 27 mmol/L (ref 22–32)
Calcium: 9.2 mg/dL (ref 8.9–10.3)
Chloride: 105 mmol/L (ref 98–111)
Creatinine: 0.79 mg/dL (ref 0.44–1.00)
GFR, Estimated: >60 mL/min (ref >60)
Glucose, Bld: 97 mg/dL (ref 70–99)
Potassium: 4.1 mmol/L (ref 3.5–5.1)
Sodium: 138 mmol/L (ref 135–145)
Total Bilirubin: 0.7 mg/dL (ref 0.3–1.2)
Total Protein: 7 g/dL (ref 6.5–8.1)

## 2022-01-04 MED ORDER — CEPHALEXIN 500 MG PO CAPS: 500.0000 mg | ORAL_CAPSULE | Freq: Two times a day (BID) | ORAL | 0 refills | Status: DC

## 2022-01-04 NOTE — Progress Notes (Signed)
[facility] up:    Brianna Carol, MD [address]  [place] [address]   DIAGNOSIS:  Cancer Staging  Malignant neoplasm of upper-outer quadrant of right breast in female, estrogen receptor positive ([place]) Staging form: Breast, AJCC 7th Edition - Clinical: Stage IIIA (T2, N2, M0) - Signed by Chauncey Cruel, MD on 09/01/2015 - Pathologic: Stage IIIC (T2, N3, cM0) - Signed by Chauncey Cruel, MD on 12/17/2015   SUMMARY OF ONCOLOGIC HISTORY: Oncology History  Malignant neoplasm of upper-outer quadrant of right breast in female, estrogen receptor positive ([place])  08/23/2015 Initial Biopsy   Right breast upper outer quadrant and lymph node biposy: IDC, grade 2, ER+, PR 5% +, Ki-67 20-30%, HER-2 negative.    09/01/2015 Initial Diagnosis   Malignant neoplasm of upper-outer quadrant of right breast in female, estrogen receptor positive ([place])   09/12/2015 - 11/14/2015 Neo-Adjuvant Chemotherapy   Docetaxel and Cyclophosphamide x 4   09/13/2015 Initial Biopsy   Satelite nodule biopsy: IDC, grade 2, identical prognostic panel to previous biopsy   12/05/2015 Surgery   Right lumpectomy with ALND Dalbert Batman): IDC grade 2, 1.5cm, DCIS, + lymphovascular invasion, 15/17 SLN positive   01/18/2016 - 03/05/2016 Radiation Therapy   Adjuvant radiation with capecitabine ([place]): 1) Right breast/nodal areas: 45 Gy in 25 fractions  2) Right breast boost: 16 Gy in 8 fractions   02/07/2016 Genetic Testing   Genetic testing was normal, and did not reveal a deleterious mutation.  Additionally, no variants of uncertain significance ([place]US[place]) were found.  Genes tested include: ATM, BARD1, BRCA1, BRCA2, BRIP1, CDH1, CHEK2, FANCC, MLH1, MSH2, MSH6, NBN, PALB2, PMS2, PTEN, RAD51C, RAD51D, TP53, and XRCC2.  This panel also includes deletion/duplication analysis (without sequencing) for one gene, EPCAM.   06/01/2016 -  Anti-estrogen oral therapy   Anastrozole daily (on pallas)      CURRENT THERAPY: Anastrozole  INTERVAL HISTORY: [name] 76 y.o. female returns for f/u of right arm swelling.  She first started experiencing intermittent arm swelling about 1-2 years ago and she saw Dr. Jana Hakim each time.  The swelling would be accompanied by erythema, and she was diagnosed with cellulitis, treated with antibiotics and then it improved.  She is unsure why her arm has now swollen for a third time.  She denies any injury, or any recent lump, bump, fever or chills.     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   Status post coronary artery stent placement    Non-ST elevation (NSTEMI) myocardial infarction [facility])    Depression with anxiety 12/31/2016   Chest pain 12/31/2016   Hyponatremia 12/31/2016   Chronic pain 12/31/2016   Essential hypertension 12/31/2016   Unstable angina ([place])    Family history of breast cancer in female 02/08/2016   Rash and nonspecific skin eruption 10/03/2015   Sensorineural hearing loss (SNHL) of both ears 09/05/2015   Malignant neoplasm of upper-outer quadrant of right breast in female, estrogen receptor positive ([place]) 09/01/2015   Sclerosing adenosis of left breast 09/01/2015   Asthma 09/12/2011   Menopausal symptoms    Chronic vaginitis    Fibromyalgia    Interstitial cystitis    UNSPECIFIED  HYPOTHYROIDISM 06/14/2009   DEPRESSIVE DISORDER NOT ELSEWHERE CLASSIFIED 06/14/2009   Hereditary and idiopathic peripheral neuropathy 06/14/2009   UNSPEC HEMORRHOIDS WITHOUT MENTION COMPLICATION 56/15/3794   ALLERGIC RHINITIS CAUSE UNSPECIFIED 06/14/2009   ESOPHAGEAL REFLUX 06/14/2009   Irritable bowel syndrome  06/14/2009   INSOMNIA UNSPECIFIED 06/14/2009   Osteoporosis 08/27/2007    is allergic to chlorhexidine and other.  MEDICAL HISTORY: Past Medical History:  Diagnosis Date   Anxiety    Arthritis 2008   Asthma 1953   age 34   Breast cancer ([place]) dx'd 2017   right   Cancer [facility])    breast cancer   Chronic vaginitis 1998   Fibromyalgia 1982   GERD (gastroesophageal reflux disease)    since at least 2011   Hemorrhoids 2009   History of radiation therapy 01/18/16-03/05/16   right breast/nodal areas 45 Gy, right breast boost 16 Gy   Hypoglycemia 2003   Hypothyroidism 1964   age 57   Interstitial cystitis 1998   Irritable bowel syndrome 06/14/2009   Qualifier: Diagnosis of  By: Patsy Baltimore RN, Denise     Menopausal symptoms    Osteoporosis 12/24/2006   Personal history of chemotherapy    Personal history of radiation therapy    Pneumonia 1979   varicella   PONV (postoperative nausea and vomiting) 1979    SURGICAL HISTORY: Past Surgical History:  Procedure Laterality Date   BLADDER HYPEREXTENSION     X4, prior to 01/2006   [place] LUMPECTOMY Right    2017   BREAST LUMPECTOMY WITH NEEDLE LOCALIZATION AND AXILLARY LYMPH NODE DISSECTION Right 12/05/2015   Procedure: RIGHT BREAST NEEDLE LOCALIZED (WIRES X 2) LUMPECTOMY WITH AXILLARY LYMPH NODE DISSECTION;  Surgeon: Fanny Skates, MD;  Location: [place];  Service: General;  Laterality: Right;   BREAST LUMPECTOMY WITH RADIOACTIVE SEED LOCALIZATION Left 12/05/2015   Procedure: RADIOACTIVE SEED GUIDED LEFT BREAST LUMPECTOMY;  Surgeon: Fanny Skates, MD;  Location: [place];  Service: General;  Laterality: Left;   BREAST SURGERY     BUNIONECTOMY     prior to 01/2006   [place]  02/08/2006   DR. INGRAM   COLONOSCOPY     CORONARY STENT INTERVENTION N/A 01/01/2017   Procedure: CORONARY STENT INTERVENTION;  Surgeon: Jettie Booze, MD;   Location: [place] [facility];  Service: Cardiovascular;  Laterality: N/A;   DENTAL SURGERY  2009-2016   13 dental implants   LEFT HEART CATH AND CORONARY ANGIOGRAPHY N/A 01/01/2017   Procedure: LEFT HEART CATH AND CORONARY ANGIOGRAPHY;  Surgeon: Jettie Booze, MD;  Location: [place] [facility];  Service: Cardiovascular;  Laterality: N/A;   LEG SURGERY  07/06/2004   Broken tibia   NASAL SINUS SURGERY     X4; prior to 01/2006   [facility] REMOVAL Right 12/05/2015   Procedure: REMOVAL PORT-A-CATH;  Surgeon: Fanny Skates, MD;  Location: [place];  Service: General;  Laterality: Right;   PORTACATH PLACEMENT Left 09/08/2015   Procedure: INSERTION OF PORT-A-CATH ;  Surgeon: Fanny Skates, MD;  Location: [place];  Service: General;  Laterality: Left;    SOCIAL HISTORY: Social History   Socioeconomic History   Marital status: Widowed    Spouse name: Not on file   Number of children: 2   Years of education: Not on file   Highest education level: Not on file  Occupational History  Not on file  Tobacco Use   Smoking status: Former    Packs/day: 0.25    Years: 15.00    Total pack years: 3.75    Types: Cigarettes   Smokeless tobacco: Never   Tobacco comments:    social smoker/"on and off"  Vaping Use   Vaping Use: Never used  Substance and Sexual Activity   Alcohol use: Yes    Alcohol/week: 0.0 standard drinks of alcohol    Comment: social   Drug use: No   Sexual activity: Not Currently    Birth control/protection: Post-menopausal  Other Topics Concern   Not on file  Social History Narrative   Not on file   Social Determinants of Health   Financial Resource Strain: Not on file  Food Insecurity: Not on file  Transportation Needs: Not on file  Physical Activity: Not on file  Stress: Not on file  Social Connections: Not on file  Intimate Partner Violence: Not on file    FAMILY HISTORY: Family History  Problem Relation Age of Onset   Hypertension Mother    Heart  disease Mother        d. 11   Parkinsonism Father        d. 39   Heart disease Sister    Breast cancer Sister 65       s/p mastectomy; reportedly negative BRCA1/2 testing approx 7 years ago   Melanoma Sister        d. 60s; hx of incresed sun exposure   Cancer Sister        dx. sarcoma of her face in her 95s; treated w/ MOHS surgery   Parkinsonism Paternal Aunt        d. 90s   Parkinsonism Paternal Uncle        d. late 58s   Kidney failure Maternal Grandmother 46   Heart attack Maternal Grandfather        d. 71s   Heart disease Paternal Grandmother        d. 43   Breast cancer Other 67       maternal great aunt (MGF's sister)   Kidney cancer Paternal Uncle 50       d. 32s; unsure about smoking status    Review of Systems  Constitutional:  Negative for appetite change, chills, fatigue, fever and unexpected weight change.  HENT:   Negative for hearing loss, lump/mass and trouble swallowing.   Eyes:  Negative for eye problems and icterus.  Respiratory:  Negative for chest tightness, cough and shortness of breath.   Cardiovascular:  Negative for chest pain, leg swelling and palpitations.  Gastrointestinal:  Negative for abdominal distention, abdominal pain, constipation, diarrhea, nausea and vomiting.  Endocrine: Negative for hot flashes.  Genitourinary:  Negative for difficulty urinating.   Musculoskeletal:  Negative for arthralgias.  Skin:  Negative for itching and rash.  Neurological:  Negative for dizziness, extremity weakness, headaches and numbness.  Hematological:  Negative for adenopathy. Does not bruise/bleed easily.  Psychiatric/Behavioral:  Negative for depression. The patient is not nervous/anxious.       PHYSICAL EXAMINATION  ECOG PERFORMANCE STATUS: 1 - Symptomatic but completely ambulatory  Vitals:   01/04/22 1033  BP: (!) 161/93  Pulse: 65  Resp: 16  Temp: (!) 97.3 F (36.3 C)  SpO2: 97%    Physical Exam Constitutional:      General: She is not in  acute distress.    Appearance: Normal appearance. She is not toxic-appearing.  HENT:  Head: Normocephalic and atraumatic.  Eyes:     General: No scleral icterus. Cardiovascular:     Rate and Rhythm: Normal rate and regular rhythm.     Pulses: Normal pulses.     Heart sounds: Normal heart sounds.  Pulmonary:     Effort: Pulmonary effort is normal.     Breath sounds: Normal breath sounds.  Abdominal:     General: Abdomen is flat. Bowel sounds are normal. There is no distension.     Palpations: Abdomen is soft.     Tenderness: There is no abdominal tenderness.  Musculoskeletal:        General: Swelling (right upper arm with swelling proximal to shoulder, limited in forearm, absent in hand) present.     Cervical back: Neck supple.  Lymphadenopathy:     Cervical: No cervical adenopathy.  Skin:    General: Skin is warm and dry.     Findings: No rash.  Neurological:     General: No focal deficit present.     Mental Status: She is alert.  Psychiatric:        Mood and Affect: Mood normal.        Behavior: Behavior normal.     LABORATORY DATA:  CBC    Component Value Date/Time   WBC 7.2 01/04/2022 1006   WBC 7.9 01/11/2021 1434   RBC 4.26 01/04/2022 1006   HGB 13.7 01/04/2022 1006   HGB 14.3 03/11/2017 1524   HCT 40.1 01/04/2022 1006   HCT 42.0 03/11/2017 1524   PLT 252 01/04/2022 1006   PLT 331 03/11/2017 1524   MCV 94.1 01/04/2022 1006   MCV 94.8 03/11/2017 1524   MCH 32.2 01/04/2022 1006   MCHC 34.2 01/04/2022 1006   RDW 14.5 01/04/2022 1006   RDW 13.6 03/11/2017 1524   LYMPHSABS 1.9 01/04/2022 1006   LYMPHSABS 0.9 03/11/2017 1524   MONOABS 0.7 01/04/2022 1006   MONOABS 0.1 03/11/2017 1524   EOSABS 0.2 01/04/2022 1006   EOSABS 0.0 03/11/2017 1524   BASOSABS 0.1 01/04/2022 1006   BASOSABS 0.0 03/11/2017 1524    CMP     Component Value Date/Time   NA 138 01/04/2022 1006   NA 138 03/11/2017 1524   K 4.1 01/04/2022 1006   K 3.8 03/11/2017 1524   CL 105  01/04/2022 1006   CO2 27 01/04/2022 1006   CO2 25 03/11/2017 1524   GLUCOSE 97 01/04/2022 1006   GLUCOSE 136 03/11/2017 1524   BUN 15 01/04/2022 1006   BUN 13.2 03/11/2017 1524   CREATININE 0.79 01/04/2022 1006   CREATININE 0.8 03/11/2017 1524   CALCIUM 9.2 01/04/2022 1006   CALCIUM 9.6 03/11/2017 1524   PROT 7.0 01/04/2022 1006   PROT 6.6 04/11/2020 1109   PROT 7.6 03/11/2017 1524   ALBUMIN 4.0 01/04/2022 1006   ALBUMIN 4.1 04/11/2020 1109   ALBUMIN 3.8 03/11/2017 1524   AST 28 01/04/2022 1006   AST 24 03/11/2017 1524   ALT 24 01/04/2022 1006   ALT 38 03/11/2017 1524   ALKPHOS 79 01/04/2022 1006   ALKPHOS 166 (H) 03/11/2017 1524   BILITOT 0.7 01/04/2022 1006   BILITOT 0.44 03/11/2017 1524   GFRNONAA >60 01/04/2022 1006   GFRAA >60 01/15/2020 1457   GFRAA >60 02/24/2019 1224      ASSESSMENT and THERAPY PLAN:   Malignant neoplasm of upper-outer quadrant of right breast in female, estrogen receptor positive ([place]) Ashawnti is here today for evaluation of her right upper arm swelling.  I recommended the following.  1. Doppler to r/o dvt due to atypical appearance and if negative 2. CT chest w/contrast to r/o recurrence and obstructive lymphedema 3. Keflex 516m PO BID x 2 weeks 4. Referral to PT   LGlendoriswill return in 4 weeks for f/u and evaluation.    All questions were answered. The patient knows to call the clinic with any problems, questions or concerns. We can certainly see the patient much sooner if necessary.  Total encounter time:30 minutes*in face-to-face visit time, chart review, lab review, care coordination, order entry, and documentation of the encounter time.  LWilber Bihari NP 01/04/22 10:00 PM Medical Oncology and H[facility]ology [facility] [place] [address]Tel. [phone]   Fax. [phone] *Total Encounter Time as defined by the Centers for Medicare and Medicaid Services includes, in addition to the face-to-face  time of a patient visit (documented in the note above) non-face-to-face time: obtaining and reviewing outside history, ordering and reviewing medications, tests or procedures, care coordination (communications with other health care professionals or caregivers) and documentation in the medical record.

## 2022-01-04 NOTE — Assessment & Plan Note (Signed)
[name] is here today for evaluation of her right upper arm swelling.  I recommended the following.  1. Doppler to r/o dvt due to atypical appearance and if negative 2. CT chest w/contrast to r/o recurrence and obstructive lymphedema 3. Keflex '500mg'$  PO BID x 2 weeks 4. Referral to PT   [name] will return in 4 weeks for f/u and evaluation.

## 2022-01-04 NOTE — Progress Notes (Signed)
Right upper extremity venous duplex has been completed. Preliminary results can be found in CV Proc through chart review.  Results were given to Wilber Bihari NP.  01/04/22 11:19 AM [name] RVT

## 2022-01-05 ENCOUNTER — Other Ambulatory Visit: Payer: Self-pay | Admitting: *Deleted

## 2022-01-05 DIAGNOSIS — Z170 Estrogen receptor positive status [ER+]: Secondary | ICD-10-CM

## 2022-01-05 MED ORDER — CEPHALEXIN 500 MG PO CAPS: 500.0000 mg | ORAL_CAPSULE | Freq: Two times a day (BID) | ORAL | 0 refills | Status: DC

## 2022-01-08 ENCOUNTER — Telehealth: Payer: Self-pay

## 2022-01-08 NOTE — Telephone Encounter (Signed)
Pt called to state she is unable to receive Keflex as the original rx was sent to wrong phx and they have a claim open. Called [facility] to speak with the pharmacist Cassandra, who was able to cancel and close the claim on their end. Pt is aware and states she will let phx at [facility] know about this.   Pt also asks if she has CT this Thursday, how long it will take for results to come back, and states she is very nervous for results. Advised pt typically results are back within 2 business days. She asked if we could call for a STAT readout. Advised pt we are only able to call for a STAT read out if we are concerned for abnormality or if results are not back within 5 business days. She verbalized thanks and understanding andf knows to call for any further concerns.

## 2022-01-11 ENCOUNTER — Ambulatory Visit (HOSPITAL_COMMUNITY)
Admission: RE | Admit: 2022-01-11 | Discharge: 2022-01-11 | Disposition: A | Discharge status: Home / Self Care | Payer: Medicare Other | Source: Ambulatory Visit | Attending: Adult Health | Admitting: Adult Health

## 2022-01-11 DIAGNOSIS — Z170 Estrogen receptor positive status [ER+]: Secondary | ICD-10-CM | POA: Diagnosis present

## 2022-01-11 DIAGNOSIS — C50411 Malignant neoplasm of upper-outer quadrant of right female breast: Secondary | ICD-10-CM | POA: Insufficient documentation

## 2022-01-11 MED ORDER — IOHEXOL 300 MG/ML  SOLN
75.0000 mL | Freq: Once | INTRAMUSCULAR | Status: AC | PRN
  Administered 2022-01-11: 75 mL via INTRAVENOUS

## 2022-01-11 MED ORDER — SODIUM CHLORIDE (PF) 0.9 % IJ SOLN
INTRAMUSCULAR | Status: AC
  Filled 2022-01-11: qty 50

## 2022-01-15 ENCOUNTER — Telehealth: Payer: Self-pay

## 2022-01-15 NOTE — Telephone Encounter (Signed)
-----   Message from Gardenia Phlegm, NP sent at 01/15/2022 12:05 PM EDT ----- Please make sure patient knows that CT scan results do not show any breast cancer recurrence or acute findings in the chest.  This is good news. ----- Message ----- From: Interface, Rad Results In Sent: 01/13/2022   1:57 PM EDT To: Gardenia Phlegm, NP

## 2022-01-15 NOTE — Telephone Encounter (Signed)
Patient called and informed of recent CT results. Patient appreciative of call and had no further questions.

## 2022-01-25 ENCOUNTER — Telehealth: Payer: Self-pay

## 2022-01-25 ENCOUNTER — Telehealth: Payer: Self-pay | Admitting: Physical Medicine and Rehabilitation

## 2022-01-25 NOTE — Telephone Encounter (Signed)
Patient called needing to schedule an appointment with Dr. Ernestina Patches for her back. The number to contact patient is  [phone]

## 2022-01-25 NOTE — Telephone Encounter (Signed)
Pt called and states she finished her course of Keflex and while she was taking it, her RUE swelling decreased and helped, but as soon as she finished keflex, swelling returned. Attempted to call pt to offer [facility] visit or MD visit. LVM for call back.

## 2022-01-26 ENCOUNTER — Other Ambulatory Visit: Payer: Self-pay | Admitting: Physical Medicine and Rehabilitation

## 2022-01-26 ENCOUNTER — Telehealth: Payer: Self-pay | Admitting: *Deleted

## 2022-01-26 ENCOUNTER — Telehealth: Payer: Self-pay | Admitting: Physical Medicine and Rehabilitation

## 2022-01-26 DIAGNOSIS — M5416 Radiculopathy, lumbar region: Secondary | ICD-10-CM

## 2022-01-26 NOTE — Telephone Encounter (Signed)
I left voicemail for patient requesting return call to schedule office visit with [facility].

## 2022-01-26 NOTE — Telephone Encounter (Signed)
Pt states she repeats right tf every 3 months can we sch for October?   CB [phone]

## 2022-01-26 NOTE — Telephone Encounter (Signed)
Patient called back and spoke with [name]. [name] is going to put in a referral for the injection and Dr. Ernestina Patches will speak with her at that appointment.  [name] to reach back out to patient to advise.

## 2022-01-26 NOTE — Telephone Encounter (Signed)
duplicate

## 2022-01-26 NOTE — Telephone Encounter (Signed)
Received call from pt with complaint of ongoing right arm swelling despite oral Keflex.  Denies redness, warmth or recent injury/trauma to extremity.  Pt states she does experience sensation and feeling in the extremity and is able to lift her arm and move her fingers with no pain or discomfort. Pt states she has applied her lymphedema sleeve and will keep the extremity elevated over the weekend.  Pt requesting to be seen by provider on Monday for further evaluation.  Appt scheduled, pt verbalized understanding of appt date and time.

## 2022-01-29 ENCOUNTER — Encounter: Payer: Self-pay | Admitting: Adult Health

## 2022-01-29 ENCOUNTER — Inpatient Hospital Stay: Payer: Medicare Other | Attending: Adult Health | Admitting: Adult Health

## 2022-01-29 VITALS — BP 139/66 | HR 83 | Temp 97.5°F | Resp 18 | Ht 62.0 in | Wt 152.1 lb

## 2022-01-29 DIAGNOSIS — Z79811 Long term (current) use of aromatase inhibitors: Secondary | ICD-10-CM | POA: Diagnosis not present

## 2022-01-29 DIAGNOSIS — I890 Lymphedema, not elsewhere classified: Secondary | ICD-10-CM

## 2022-01-29 DIAGNOSIS — C50411 Malignant neoplasm of upper-outer quadrant of right female breast: Secondary | ICD-10-CM | POA: Insufficient documentation

## 2022-01-29 DIAGNOSIS — Z170 Estrogen receptor positive status [ER+]: Secondary | ICD-10-CM | POA: Insufficient documentation

## 2022-01-29 DIAGNOSIS — L539 Erythematous condition, unspecified: Secondary | ICD-10-CM | POA: Diagnosis not present

## 2022-01-29 DIAGNOSIS — R609 Edema, unspecified: Secondary | ICD-10-CM | POA: Insufficient documentation

## 2022-01-29 MED ORDER — DOXYCYCLINE HYCLATE 100 MG PO TABS: 100.0000 mg | ORAL_TABLET | Freq: Two times a day (BID) | ORAL | 0 refills | Status: DC

## 2022-01-29 NOTE — Therapy (Signed)
OUTPATIENT PHYSICAL THERAPY ONCOLOGY EVALUATION  Patient Name: [name] MRN: [id number] DOB:26-Jan-1946, 76 y.o., female Today's Date: 01/31/2022   PT End of Session - 01/31/22 1718     Visit Number 1    Number of Visits 19    Date for PT Re-Evaluation 03/14/22    PT Start Time 1600    PT Stop Time 1700    PT Time Calculation (min) 60 min    Activity Tolerance Patient tolerated treatment well    Behavior During Therapy [facility] for tasks assessed/performed             Past Medical History:  Diagnosis Date   Anxiety    Arthritis 2008   Asthma 1953   age 56   Breast cancer ([place]) dx'd 2017   right   Cancer ([place])    breast cancer   Chronic vaginitis 1998   Fibromyalgia 1982   GERD (gastroesophageal reflux disease)    since at least 2011   Hemorrhoids 2009   History of radiation therapy 01/18/16-03/05/16   right breast/nodal areas 45 Gy, right breast boost 16 Gy   Hypoglycemia 2003   Hypothyroidism 1964   age 72   Interstitial cystitis 1998   Irritable bowel syndrome 06/14/2009   Qualifier: Diagnosis of  By: Patsy Baltimore RN, Denise     Menopausal symptoms    Osteoporosis 12/24/2006   Personal history of chemotherapy    Personal history of radiation therapy    Pneumonia 1979   varicella   PONV (postoperative nausea and vomiting) 1979   Past Surgical History:  Procedure Laterality Date   BLADDER HYPEREXTENSION     X4, prior to 01/2006   [place] LUMPECTOMY Right    2017   BREAST LUMPECTOMY WITH NEEDLE LOCALIZATION AND AXILLARY LYMPH NODE DISSECTION Right 12/05/2015   Procedure: RIGHT BREAST NEEDLE LOCALIZED (WIRES X 2) LUMPECTOMY WITH AXILLARY LYMPH NODE DISSECTION;  Surgeon: Fanny Skates, MD;  Location: [place];  Service: General;  Laterality: Right;   BREAST LUMPECTOMY WITH RADIOACTIVE SEED LOCALIZATION Left 12/05/2015   Procedure: RADIOACTIVE SEED GUIDED LEFT BREAST LUMPECTOMY;  Surgeon: Fanny Skates, MD;  Location: [place];   Service: General;  Laterality: Left;   BREAST SURGERY     BUNIONECTOMY     prior to 01/2006   [place]  02/08/2006   DR. INGRAM   COLONOSCOPY     CORONARY STENT INTERVENTION N/A 01/01/2017   Procedure: CORONARY STENT INTERVENTION;  Surgeon: Jettie Booze, MD;  Location: [place] CV LAB;  Service: Cardiovascular;  Laterality: N/A;   DENTAL SURGERY  2009-2016   13 dental implants   LEFT HEART CATH AND CORONARY ANGIOGRAPHY N/A 01/01/2017   Procedure: LEFT HEART CATH AND CORONARY ANGIOGRAPHY;  Surgeon: Jettie Booze, MD;  Location: [place] [facility];  Service: Cardiovascular;  Laterality: N/A;   LEG SURGERY  07/06/2004   Broken tibia   NASAL SINUS SURGERY     X4; prior to 01/2006   [facility] REMOVAL Right 12/05/2015   Procedure: REMOVAL PORT-A-CATH;  Surgeon: Fanny Skates, MD;  Location: [place];  Service: General;  Laterality: Right;   PORTACATH PLACEMENT Left 09/08/2015   Procedure: INSERTION OF PORT-A-CATH ;  Surgeon: Fanny Skates, MD;  Location: [place];  Service: General;  Laterality: Left;   Patient Active Problem List   Diagnosis Date Noted   Caregiver stress  07/26/2020   Adjustment disorder with mixed anxiety and depressed mood 12/01/2019   Lumbar radiculopathy 05/11/2019   Bilateral stenosis of lateral recess of lumbar spine 05/11/2019   Hyperlipidemia 12/23/2018   Chronic diastolic CHF (congestive heart failure) ([place]) 11/24/2018   Abnormal transaminases 03/24/2018   CAD (coronary artery disease) 02/11/2017   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   Status post coronary artery stent placement    Non-ST elevation (NSTEMI) myocardial infarction [facility])    Depression with anxiety 12/31/2016   Chest pain 12/31/2016   Hyponatremia 12/31/2016   Chronic pain 12/31/2016   Essential hypertension 12/31/2016   Unstable angina (HCC)    Family history of breast cancer in female 02/08/2016   Rash and nonspecific  skin eruption 10/03/2015   Sensorineural hearing loss (SNHL) of both ears 09/05/2015   Malignant neoplasm of upper-outer quadrant of right breast in female, estrogen receptor positive ([place]) 09/01/2015   Sclerosing adenosis of left breast 09/01/2015   Asthma 09/12/2011   Chronic vaginitis    Fibromyalgia    Interstitial cystitis    UNSPECIFIED HYPOTHYROIDISM 06/14/2009   DEPRESSIVE DISORDER NOT ELSEWHERE CLASSIFIED 06/14/2009   Hereditary and idiopathic peripheral neuropathy 06/14/2009   UNSPEC HEMORRHOIDS WITHOUT MENTION COMPLICATION 03/50/0938   ALLERGIC RHINITIS CAUSE UNSPECIFIED 06/14/2009   ESOPHAGEAL REFLUX 06/14/2009   Irritable bowel syndrome 06/14/2009   INSOMNIA UNSPECIFIED 06/14/2009   Osteoporosis 08/27/2007    PCP: [place] Carol, MD  REFERRING PROVIDER: Wilber Bihari, NP  REFERRING DIAG: Rt UE lymphedema  THERAPY DIAG:  Malignant neoplasm of upper-outer quadrant of right breast in female, estrogen receptor positive ([place])  Lymphedema, not elsewhere classified  ONSET DATE: 12/30/19  Rationale for Evaluation and Treatment Rehabilitation  SUBJECTIVE                                                                                                                                                                                           SUBJECTIVE STATEMENT: I started to have to care for my husband about 1 year ago, he has passed away now, and I started to get some arm swelling.    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 She was treated with neoadjuvant chemotherapy and had a right and left lumpectomy with right axillary lymph node dissection on 12/05/15, with 15 out of 17 lymph nodes being positive. Radiation completed. She has a history of fibromyalgia and osteoporosis. They kept thinking that I had cellulitis in the arm starting 2 years ago and had been off and on antibiotics.  I have a compression  sleeve.  CT and doppler  negative.    PAIN:  Are you having pain? No  PRECAUTIONS: Rt lymphedema  WEIGHT BEARING RESTRICTIONS No  FALLS:  Has patient fallen in last 6 months? No  LIVING ENVIRONMENT: Lives with: lives alone with caregiver 24/7 - will be going to [place] next month  Lives in: House/apartment  OCCUPATION: Retired   LEISURE: not discussed  HAND DOMINANCE : right   PRIOR LEVEL OF FUNCTION: uses care aide 24/7  PATIENT GOALS what to do for the swelling   OBJECTIVE  COGNITION:  Overall cognitive status: Within functional limits for tasks assessed   PALPATION: Non pitting  OBSERVATIONS / OTHER ASSESSMENTS: Rt UE significantly larger  POSTURE: rounded shoulders    LYMPHEDEMA ASSESSMENTS:  LANDMARK RIGHT  eval  axilla 33  15cm 33.5  10 cm proximal to olecranon process 25  Olecranon process 28.5  15cm proximal  26  10 cm proximal to ulnar styloid process 22.2  Just proximal to ulnar styloid process 15  Across hand at thumb web space 18.3  At base of 2nd digit 7.4  (Blank rows = not tested) Length of 10 2058m  LANDMARK LEFT  eval  axilla 31  15cm 30  10 cm proximal to olecranon process 28.8  Olecranon process 24.5  15cm 20.8  10 cm proximal to ulnar styloid process 18.2  Just proximal to ulnar styloid process 14  Across hand at thumb web space 17  At base of 2nd digit 5.8  (Blank rows = not tested)   16742m LLIS:  10  TODAY'S TREATMENT  01/30/22: Eval visit discussed POC options showing pt day and night garments, velcro, bandaging options  PATIENT EDUCATION:  Education details: per today's visit Person educated: Patient Education method: ExCustomer service managerducation comprehension: verbalized understanding   HOME EXERCISE PROGRAM: none  ASSESSMENT:  CLINICAL IMPRESSION: Patient is a [age].o. female who was seen today for physical therapy evaluation and treatment for her Rt UE lymphedema. This lymphedema started  around 2017 but pt was always told that is was nothing or that it was cellulitis and given antibiotics as the edema had some red coloring.  Pt has a 33626mifference between sides and visible size difference in the UE.  This is no longer pitting so pt was educated on how we can attempt bandaging but that it may not decrease much.  Pt is emotional about the new diagnosis of lymphedema and the treatment options as she does not like to be seen as a patient.  All options were discussed and pt is wanting to attempt bandaging and knows she can switch to garments as needed.  Pt is moving to [place] around 1 months but currently has a caregiver "RitVelva Harmanho will come and learn bandaging.    OBJECTIVE IMPAIRMENTS decreased knowledge of condition, decreased knowledge of use of DME, and increased edema.   ACTIVITY LIMITATIONS lifting  PARTICIPATION LIMITATIONS: meal prep, cleaning, laundry, and community activity  PERSONAL FACTORS Age, Time since onset of injury/illness/exacerbation, and 1-2 comorbidities: sugical and radiation hx  are also affecting patient's functional outcome.   REHAB POTENTIAL: Good  CLINICAL DECISION MAKING: Stable/uncomplicated  EVALUATION COMPLEXITY: Low  GOALS: Goals reviewed with patient? Yes   LONG TERM GOALS: Target date: 03/14/2022    Pt will be educated on lymphedema and CDT principles Baseline:  Goal status: INITIAL  2.  Pt will decrease UE volume by at least 100m38m decrease risk of infection Baseline:  Goal status: INITIAL  3.  Pt  will decrease LLIS by 4 points to demonstrate improved status Baseline:  Goal status: INITIAL  4.  Pt will obtain compression for the UE Baseline:  Goal status: INITIAL    PLAN: PT FREQUENCY: 3x/week  PT DURATION: 6 weeks  PLANNED INTERVENTIONS: Therapeutic exercises, Therapeutic activity, Patient/Family education, Self Care, DME instructions, Manual therapy, and Re-evaluation  PLAN FOR NEXT SESSION: Education on CDT  principles, start with MLD and light bandaging as pt is unsure about this, progress as able until plateau.    Stark Bray, PT 01/31/2022, 5:19 PM

## 2022-01-29 NOTE — Progress Notes (Signed)
[place] [facility] Follow up:    Brianna Carol, MD [address] [place]  [address]   DIAGNOSIS:  Cancer Staging  Malignant neoplasm of upper-outer quadrant of right breast in female, estrogen receptor positive ([place]) Staging form: Breast, AJCC 7th Edition - Clinical: Stage IIIA (T2, N2, M0) - Signed by Chauncey Cruel, MD on 09/01/2015 - Pathologic: Stage IIIC (T2, N3, cM0) - Signed by Chauncey Cruel, MD on 12/17/2015   SUMMARY OF ONCOLOGIC HISTORY: Oncology History  Malignant neoplasm of upper-outer quadrant of right breast in female, estrogen receptor positive ([place])  08/23/2015 Initial Biopsy   Right breast upper outer quadrant and lymph node biposy: IDC, grade 2, ER+, PR 5% +, Ki-67 20-30%, HER-2 negative.    09/01/2015 Initial Diagnosis   Malignant neoplasm of upper-outer quadrant of right breast in female, estrogen receptor positive ([place])   09/12/2015 - 11/14/2015 Neo-Adjuvant Chemotherapy   Docetaxel and Cyclophosphamide x 4   09/13/2015 Initial Biopsy   Satelite nodule biopsy: IDC, grade 2, identical prognostic panel to previous biopsy   12/05/2015 Surgery   Right lumpectomy with ALND [place]): IDC grade 2, 1.5cm, DCIS, + lymphovascular invasion, 15/17 SLN positive   01/18/2016 - 03/05/2016 Radiation Therapy   Adjuvant radiation with capecitabine ([place]): 1) Right breast/nodal areas: 45 Gy in 25 fractions  2) Right breast boost: 16 Gy in 8 fractions   02/07/2016 Genetic Testing   Genetic testing was normal, and did not reveal a deleterious mutation.  Additionally, no variants of uncertain significance ([place]) were found.  Genes tested include: ATM, BARD1, BRCA1, BRCA2, BRIP1, CDH1, CHEK2, FANCC, MLH1, MSH2, MSH6, NBN, PALB2, PMS2, PTEN, RAD51C, RAD51D, TP53, and XRCC2.  This panel also includes deletion/duplication analysis (without sequencing) for one gene, EPCAM.   06/01/2016 -  Anti-estrogen oral therapy   Anastrozole daily (on p[place])      CURRENT THERAPY: Anastrozole  INTERVAL HISTORY: [name] 76 y.o. female returns for follow-up of her history of breast cancer.  She has recurrent right arm erythema and swelling.  I last saw her for this on September 7.  She underwent a right upper extremity Doppler that was negative for DVT and a CT chest that showed no lymphadenopathy or obstructive reason for the late onset lymphedema.  She was prescribed Keflex and took this and the arm improved however the redness returned about a week or so ago.  She denies any fevers or chills.  Her mammogram is due later this month.   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   Status post coronary artery stent placement    Non-ST elevation (NSTEMI) myocardial infarction [facility])    Depression with anxiety 12/31/2016   Chest pain 12/31/2016   Hyponatremia 12/31/2016   Chronic pain 12/31/2016   Essential hypertension 12/31/2016   Unstable angina ([place])    Family history of breast cancer in female 02/08/2016   Rash and nonspecific skin eruption 10/03/2015   Sensorineural hearing loss (SNHL) of both ears 09/05/2015   Malignant neoplasm of upper-outer quadrant of right breast in female, estrogen receptor positive ([place]) 09/01/2015  Sclerosing adenosis of left breast 09/01/2015   Asthma 09/12/2011   Chronic vaginitis    Fibromyalgia    Interstitial cystitis    UNSPECIFIED HYPOTHYROIDISM 06/14/2009   DEPRESSIVE DISORDER NOT ELSEWHERE CLASSIFIED 06/14/2009   Hereditary and idiopathic peripheral neuropathy 06/14/2009    UNSPEC HEMORRHOIDS WITHOUT MENTION COMPLICATION 23/53/6144   ALLERGIC RHINITIS CAUSE UNSPECIFIED 06/14/2009   ESOPHAGEAL REFLUX 06/14/2009   Irritable bowel syndrome 06/14/2009   INSOMNIA UNSPECIFIED 06/14/2009   Osteoporosis 08/27/2007    is allergic to chlorhexidine and other.  MEDICAL HISTORY: Past Medical History:  Diagnosis Date   Anxiety    Arthritis 2008   Asthma 1953   age 20   Breast cancer ([place]) dx'd 2017   right   Cancer [facility])    breast cancer   Chronic vaginitis 1998   Fibromyalgia 1982   GERD (gastroesophageal reflux disease)    since at least 2011   Hemorrhoids 2009   History of radiation therapy 01/18/16-03/05/16   right breast/nodal areas 45 Gy, right breast boost 16 Gy   Hypoglycemia 2003   Hypothyroidism 1964   age 62   Interstitial cystitis 1998   Irritable bowel syndrome 06/14/2009   Qualifier: Diagnosis of  By: Patsy Baltimore RN, Denise     Menopausal symptoms    Osteoporosis 12/24/2006   Personal history of chemotherapy    Personal history of radiation therapy    Pneumonia 1979   varicella   PONV (postoperative nausea and vomiting) 1979    SURGICAL HISTORY: Past Surgical History:  Procedure Laterality Date   BLADDER HYPEREXTENSION     X4, prior to 01/2006   [place] LUMPECTOMY Right    2017   BREAST LUMPECTOMY WITH NEEDLE LOCALIZATION AND AXILLARY LYMPH NODE DISSECTION Right 12/05/2015   Procedure: RIGHT BREAST NEEDLE LOCALIZED (WIRES X 2) LUMPECTOMY WITH AXILLARY LYMPH NODE DISSECTION;  Surgeon: Fanny Skates, MD;  Location: [place];  Service: General;  Laterality: Right;   BREAST LUMPECTOMY WITH RADIOACTIVE SEED LOCALIZATION Left 12/05/2015   Procedure: RADIOACTIVE SEED GUIDED LEFT BREAST LUMPECTOMY;  Surgeon: Fanny Skates, MD;  Location: [place];  Service: General;  Laterality: Left;   BREAST SURGERY     BUNIONECTOMY     prior to 01/2006   [place]   02/08/2006   DR. INGRAM   COLONOSCOPY     CORONARY STENT INTERVENTION N/A 01/01/2017   Procedure: CORONARY STENT INTERVENTION;  Surgeon: Jettie Booze, MD;  Location: [place] CV LAB;  Service: Cardiovascular;  Laterality: N/A;   DENTAL SURGERY  2009-2016   13 dental implants   LEFT HEART CATH AND CORONARY ANGIOGRAPHY N/A 01/01/2017   Procedure: LEFT HEART CATH AND CORONARY ANGIOGRAPHY;  Surgeon: Jettie Booze, MD;  Location: [place] [facility];  Service: Cardiovascular;  Laterality: N/A;   LEG SURGERY  07/06/2004   Broken tibia   NASAL SINUS SURGERY     X4; prior to 01/2006   [facility] REMOVAL Right 12/05/2015   Procedure: REMOVAL PORT-A-CATH;  Surgeon: Fanny Skates, MD;  Location: [place];  Service: General;  Laterality: Right;   PORTACATH PLACEMENT Left 09/08/2015   Procedure: INSERTION OF PORT-A-CATH ;  Surgeon: Fanny Skates, MD;  Location: [place];  Service: General;  Laterality: Left;    SOCIAL HISTORY: Social History   Socioeconomic History   Marital status: Widowed    Spouse name: Not on file  Number of children: 2   Years of education: Not on file   Highest education level: Not on file  Occupational History   Not on file  Tobacco Use   Smoking status: Former    Packs/day: 0.25    Years: 15.00    Total pack years: 3.75    Types: Cigarettes   Smokeless tobacco: Never   Tobacco comments:    social smoker/"on and off"  Vaping Use   Vaping Use: Never used  Substance and Sexual Activity   Alcohol use: Yes    Alcohol/week: 0.0 standard drinks of alcohol    Comment: social   Drug use: No   Sexual activity: Not Currently    Birth control/protection: Post-menopausal  Other Topics Concern   Not on file  Social History Narrative   Not on file   Social Determinants of Health   Financial Resource Strain: Not on file  Food Insecurity: Not on file  Transportation Needs: Not on file  Physical Activity: Not on file  Stress: Not on file  Social  Connections: Not on file  Intimate Partner Violence: Not on file    FAMILY HISTORY: Family History  Problem Relation Age of Onset   Hypertension Mother    Heart disease Mother        d. 81   Parkinsonism Father        d. 45   Heart disease Sister    Breast cancer Sister 82       s/p mastectomy; reportedly negative BRCA1/2 testing approx 7 years ago   Melanoma Sister        d. 9s; hx of incresed sun exposure   Cancer Sister        dx. sarcoma of her face in her 36s; treated w/ MOHS surgery   Parkinsonism Paternal Aunt        d. 90s   Parkinsonism Paternal Uncle        d. late 37s   Kidney failure Maternal Grandmother 46   Heart attack Maternal Grandfather        d. 67s   Heart disease Paternal Grandmother        d. 27   Breast cancer Other 25       maternal great aunt (MGF's sister)   Kidney cancer Paternal Uncle 50       d. 7s; unsure about smoking status    Review of Systems  Constitutional:  Negative for appetite change, chills, fatigue, fever and unexpected weight change.  HENT:   Negative for hearing loss, lump/mass and trouble swallowing.   Eyes:  Negative for eye problems and icterus.  Respiratory:  Negative for chest tightness, cough and shortness of breath.   Cardiovascular:  Negative for chest pain, leg swelling and palpitations.  Gastrointestinal:  Negative for abdominal distention, abdominal pain, constipation, diarrhea, nausea and vomiting.  Endocrine: Negative for hot flashes.  Genitourinary:  Negative for difficulty urinating.   Musculoskeletal:  Negative for arthralgias.  Skin:  Negative for itching and rash.  Neurological:  Negative for dizziness, extremity weakness, headaches and numbness.  Hematological:  Negative for adenopathy. Does not bruise/bleed easily.  Psychiatric/Behavioral:  Negative for depression. The patient is not nervous/anxious.       PHYSICAL EXAMINATION  ECOG PERFORMANCE STATUS: 1 - Symptomatic but completely  ambulatory  Vitals:   01/29/22 1155  BP: 139/66  Pulse: 83  Resp: 18  Temp: (!) 97.5 F (36.4 C)  SpO2: 96%    Physical Exam Constitutional:  General: She is not in acute distress.    Appearance: Normal appearance. She is not toxic-appearing.  HENT:     Head: Normocephalic and atraumatic.  Eyes:     General: No scleral icterus. Cardiovascular:     Rate and Rhythm: Normal rate and regular rhythm.     Pulses: Normal pulses.     Heart sounds: Normal heart sounds.  Pulmonary:     Effort: Pulmonary effort is normal.     Breath sounds: Normal breath sounds.  Abdominal:     General: Abdomen is flat. Bowel sounds are normal. There is no distension.     Palpations: Abdomen is soft.     Tenderness: There is no abdominal tenderness. There is no right CVA tenderness.  Musculoskeletal:        General: No swelling.     Cervical back: Neck supple.  Lymphadenopathy:     Cervical: No cervical adenopathy.  Skin:    General: Skin is warm and dry.     Findings: No rash.     Comments: Right arm with erythema swelling slight tenderness  Neurological:     General: No focal deficit present.     Mental Status: She is alert.  Psychiatric:        Mood and Affect: Mood normal.        Behavior: Behavior normal.     LABORATORY DATA: None for this vi    ASSESSMENT and THERAPY PLAN:   Malignant neoplasm of upper-outer quadrant of right breast in female, estrogen receptor positive ([place]) [name] is here today for evaluation of her recurrent right upper arm erythema and swelling:  I recommended that she do the following:  1.  Doxycycline 100 mg p.o. twice daily 2.  Referral to physical therapy for evaluation and treatment.  She will let us know if this does not help with improving her arm.     All questions were answered. The patient knows to call the clinic with any problems, questions or concerns. We can certainly see the patient much sooner if necessary.  Total encounter  time:20 minutes*in face-to-face visit time, chart review, lab review, care coordination, order entry, and documentation of the encounter time.    Wilber Bihari, NP 01/29/22 3:41 PM Medical Oncology and Hematology [facility] [place], [place] [address] Tel. [phone]    Fax. [phone]  *Total Encounter Time as defined by the Centers for Medicare and Medicaid Services includes, in addition to the face-to-face time of a patient visit (documented in the note above) non-face-to-face time: obtaining and reviewing outside history, ordering and reviewing medications, tests or procedures, care coordination (communications with other health care professionals or caregivers) and documentation in the medical record.

## 2022-01-29 NOTE — Assessment & Plan Note (Signed)
[name] is here today for evaluation of her recurrent right upper arm erythema and swelling:  I recommended that she do the following:  1.  Doxycycline 100 mg p.o. twice daily 2.  Referral to physical therapy for evaluation and treatment.  She will let us know if this does not help with improving her arm.

## 2022-01-30 ENCOUNTER — Encounter: Payer: Self-pay | Admitting: Rehabilitation

## 2022-01-30 ENCOUNTER — Ambulatory Visit: Payer: Medicare Other | Attending: Adult Health | Admitting: Rehabilitation

## 2022-01-30 ENCOUNTER — Other Ambulatory Visit: Payer: Self-pay

## 2022-01-30 ENCOUNTER — Telehealth: Payer: Self-pay | Admitting: Physical Medicine and Rehabilitation

## 2022-01-30 DIAGNOSIS — I890 Lymphedema, not elsewhere classified: Secondary | ICD-10-CM | POA: Diagnosis present

## 2022-01-30 DIAGNOSIS — C50411 Malignant neoplasm of upper-outer quadrant of right female breast: Secondary | ICD-10-CM

## 2022-01-30 NOTE — Telephone Encounter (Signed)
Pt returned call to [facility] to set an appt. Please call pt at [phone]

## 2022-01-31 ENCOUNTER — Other Ambulatory Visit: Payer: Self-pay | Admitting: Interventional Cardiology

## 2022-02-01 ENCOUNTER — Ambulatory Visit: Payer: Medicare Other | Admitting: Adult Health

## 2022-02-01 ENCOUNTER — Other Ambulatory Visit: Payer: Self-pay | Admitting: Diagnostic Radiology

## 2022-02-01 DIAGNOSIS — R928 Other abnormal and inconclusive findings on diagnostic imaging of breast: Secondary | ICD-10-CM

## 2022-02-12 ENCOUNTER — Ambulatory Visit: Payer: Medicare Other | Admitting: Rehabilitation

## 2022-02-12 ENCOUNTER — Encounter: Payer: Self-pay | Admitting: Rehabilitation

## 2022-02-12 DIAGNOSIS — Z170 Estrogen receptor positive status [ER+]: Secondary | ICD-10-CM

## 2022-02-12 DIAGNOSIS — I890 Lymphedema, not elsewhere classified: Secondary | ICD-10-CM

## 2022-02-12 NOTE — Therapy (Signed)
OUTPATIENT PHYSICAL THERAPY ONCOLOGYTREATMENT  Patient Name: MATTISON GOLAY MRN: [id number] DOB:[birth year], 76 y.o., female Today's Date: 02/12/2022   PT End of Session - 02/12/22 1146     Visit Number 2    Number of Visits 19    Date for PT Re-Evaluation 03/14/22    PT Start Time 3614    PT Stop Time 4315    PT Time Calculation (min) 60 min    Activity Tolerance Patient tolerated treatment well    Behavior During Therapy [facility] for tasks assessed/performed              Past Medical History:  Diagnosis Date   Anxiety    Arthritis 2008   Asthma 1953   age 81   Breast cancer ([place]) dx'd 2017   right   Cancer ([place])    breast cancer   Chronic vaginitis 1998   Fibromyalgia 1982   GERD (gastroesophageal reflux disease)    since at least 2011   Hemorrhoids 2009   History of radiation therapy 01/18/16-03/05/16   right breast/nodal areas 45 Gy, right breast boost 16 Gy   Hypoglycemia 2003   Hypothyroidism 1964   age 78   Interstitial cystitis 1998   Irritable bowel syndrome 06/14/2009   Qualifier: Diagnosis of  By: Patsy Baltimore RN, Denise     Menopausal symptoms    Osteoporosis 12/24/2006   Personal history of chemotherapy    Personal history of radiation therapy    Pneumonia 1979   varicella   PONV (postoperative nausea and vomiting) 1979   Past Surgical History:  Procedure Laterality Date   BLADDER HYPEREXTENSION     X4, prior to 01/2006   [place] LUMPECTOMY Right    2017   BREAST LUMPECTOMY WITH NEEDLE LOCALIZATION AND AXILLARY LYMPH NODE DISSECTION Right 12/05/2015   Procedure: RIGHT BREAST NEEDLE LOCALIZED (WIRES X 2) LUMPECTOMY WITH AXILLARY LYMPH NODE DISSECTION;  Surgeon: Fanny Skates, MD;  Location: [place];  Service: General;  Laterality: Right;   BREAST LUMPECTOMY WITH RADIOACTIVE SEED LOCALIZATION Left 12/05/2015   Procedure: RADIOACTIVE SEED GUIDED LEFT BREAST LUMPECTOMY;  Surgeon: Fanny Skates, MD;  Location: [place];   Service: General;  Laterality: Left;   BREAST SURGERY     BUNIONECTOMY     prior to 01/2006   [place]  02/08/2006   DR. INGRAM   COLONOSCOPY     CORONARY STENT INTERVENTION N/A 01/01/2017   Procedure: CORONARY STENT INTERVENTION;  Surgeon: Jettie Booze, MD;  Location: [place] CV [facility];  Service: Cardiovascular;  Laterality: N/A;   DENTAL SURGERY  2009-2016   13 dental implants   LEFT HEART CATH AND CORONARY ANGIOGRAPHY N/A 01/01/2017   Procedure: LEFT HEART CATH AND CORONARY ANGIOGRAPHY;  Surgeon: Jettie Booze, MD;  Location: [place] [facility];  Service: Cardiovascular;  Laterality: N/A;   LEG SURGERY  07/06/2004   Broken tibia   NASAL SINUS SURGERY     X4; prior to 01/2006   [facility] REMOVAL Right 12/05/2015   Procedure: REMOVAL PORT-A-CATH;  Surgeon: Fanny Skates, MD;  Location: [place];  Service: General;  Laterality: Right;   PORTACATH PLACEMENT Left 09/08/2015   Procedure: INSERTION OF PORT-A-CATH ;  Surgeon: Fanny Skates, MD;  Location: [place];  Service: General;  Laterality: Left;   Patient Active Problem List   Diagnosis Date Noted   Caregiver stress  07/26/2020   Adjustment disorder with mixed anxiety and depressed mood 12/01/2019   Lumbar radiculopathy 05/11/2019   Bilateral stenosis of lateral recess of lumbar spine 05/11/2019   Hyperlipidemia 12/23/2018   Chronic diastolic CHF (congestive heart failure) ([place]) 11/24/2018   Abnormal transaminases 03/24/2018   CAD (coronary artery disease) 02/11/2017   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   Status post coronary artery stent placement    Non-ST elevation (NSTEMI) myocardial infarction [facility])    Depression with anxiety 12/31/2016   Chest pain 12/31/2016   Hyponatremia 12/31/2016   Chronic pain 12/31/2016   Essential hypertension 12/31/2016   Unstable angina (HCC)    Family history of breast cancer in female 02/08/2016   Rash and nonspecific  skin eruption 10/03/2015   Sensorineural hearing loss (SNHL) of both ears 09/05/2015   Malignant neoplasm of upper-outer quadrant of right breast in female, estrogen receptor positive ([place]) 09/01/2015   Sclerosing adenosis of left breast 09/01/2015   Asthma 09/12/2011   Chronic vaginitis    Fibromyalgia    Interstitial cystitis    UNSPECIFIED HYPOTHYROIDISM 06/14/2009   DEPRESSIVE DISORDER NOT ELSEWHERE CLASSIFIED 06/14/2009   Hereditary and idiopathic peripheral neuropathy 06/14/2009   UNSPEC HEMORRHOIDS WITHOUT MENTION COMPLICATION 09/32/3557   ALLERGIC RHINITIS CAUSE UNSPECIFIED 06/14/2009   ESOPHAGEAL REFLUX 06/14/2009   Irritable bowel syndrome 06/14/2009   INSOMNIA UNSPECIFIED 06/14/2009   Osteoporosis 08/27/2007    PCP: Seward Carol, MD  REFERRING PROVIDER: Wilber Bihari, NP  REFERRING DIAG: Rt UE lymphedema  THERAPY DIAG:  Malignant neoplasm of upper-outer quadrant of right breast in female, estrogen receptor positive ([place])  Lymphedema, not elsewhere classified  ONSET DATE: 12/30/19  Rationale for Evaluation and Treatment Rehabilitation  SUBJECTIVE                                                                                                                                                                                           SUBJECTIVE STATEMENT:  Nothing new.    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 She was treated with neoadjuvant chemotherapy and had a right and left lumpectomy with right axillary lymph node dissection on 12/05/15, with 15 out of 17 lymph nodes being positive. Radiation completed. She has a history of fibromyalgia and osteoporosis. They kept thinking that I had cellulitis in the arm starting 2 years ago and had been off and on antibiotics.  I have a compression sleeve.  CT and doppler negative.    PAIN:  Are you having pain? No  PRECAUTIONS: Rt lymphedema  WEIGHT  BEARING RESTRICTIONS No  FALLS:  Has patient fallen in last 6 months? No  LIVING ENVIRONMENT: Lives with: lives alone with caregiver 24/7 - will be going to [place] next month  Lives in: House/apartment  OCCUPATION: Retired   LEISURE: not discussed  HAND DOMINANCE : right   PRIOR LEVEL OF FUNCTION: uses care aide 24/7  PATIENT GOALS what to do for the swelling   OBJECTIVE  PALPATION: Non pitting  OBSERVATIONS / OTHER ASSESSMENTS: Rt UE significantly larger  POSTURE: rounded shoulders    LYMPHEDEMA ASSESSMENTS:  LANDMARK RIGHT  eval 02/12/22  axilla 33 33  15cm 33.5 33.5  10 cm proximal to olecranon process 35 34  Olecranon process 28.5 28.5  15cm proximal  26 26.2  10 cm proximal to ulnar styloid process 22.2 23  Just proximal to ulnar styloid process 15 15  Across hand at thumb web space 18.3 18.3  At base of 2nd digit 7.4 6.2  (Blank rows = not tested) Length of 10 2032m  LANDMARK LEFT  eval  axilla 31  15cm 30  10 cm proximal to olecranon process 28.8  Olecranon process 24.5  15cm 20.8  10 cm proximal to ulnar styloid process 18.2  Just proximal to ulnar styloid process 14  Across hand at thumb web space 17  At base of 2nd digit 5.8  (Blank rows = not tested)   16763m LLIS:  10  TODAY'S TREATMENT   02/12/22:  Education on CDT principles MLD: In supine with 2 pillows: MLD: In supine: Short neck, 5 diaphragmatic breaths, L axillary nodes and establishment of interaxillary pathway, R inguinal nodes and establishment of axilloinguinal pathway, then R breast moving fluid towards pathways spending extra time in any areas of fibrosis then retracing all steps. With education on principles of MLD, use of pump, and answering all questions.   Compression bandaging: eucerin lotion, tg soft inside out, fingers 1-4, artiflex from hand to axilla, 1 6cm hand bandage, 1 8cm and 1 10cm from wrist to axilla with 8cm in herringbone.  Education throughout on when to  remove, bandage care, etc.   01/30/22:   Eval visit discussed POC options showing pt day and night garments, velcro, bandaging options  PATIENT EDUCATION:  Education details: per today's visit Person educated: Patient Education method: ExCustomer service managerducation comprehension: verbalized understanding   HOME EXERCISE PROGRAM: none  ASSESSMENT:  CLINICAL IMPRESSION: Pt started CDT for the Rt UE today.  Will continue with POC.  Pt will try her best with bandaging but feels a bit overwhelmed with it.    OBJECTIVE IMPAIRMENTS decreased knowledge of condition, decreased knowledge of use of DME, and increased edema.   ACTIVITY LIMITATIONS lifting  PARTICIPATION LIMITATIONS: meal prep, cleaning, laundry, and community activity  PERSONAL FACTORS Age, Time since onset of injury/illness/exacerbation, and 1-2 comorbidities: sugical and radiation hx  are also affecting patient's functional outcome.   REHAB POTENTIAL: Good  CLINICAL DECISION MAKING: Stable/uncomplicated  EVALUATION COMPLEXITY: Low  GOALS: Goals reviewed with patient? Yes   LONG TERM GOALS: Target date: 03/14/2022    Pt will be educated on lymphedema and CDT principles Baseline:  Goal status: INITIAL  2.  Pt will decrease UE volume by at least 10043mo decrease risk of infection Baseline:  Goal status: INITIAL  3.  Pt will decrease LLIS by 4 points to demonstrate improved status Baseline:  Goal status: INITIAL  4.  Pt will obtain compression for the UE Baseline:  Goal status: INITIAL    PLAN: PT FREQUENCY: 3x/week  PT DURATION: 6 weeks  PLANNED INTERVENTIONS: Therapeutic exercises, Therapeutic activity, Patient/Family education, Self Care, DME instructions, Manual therapy, and Re-evaluation  PLAN FOR NEXT SESSION: Education on CDT principles, start with MLD and light bandaging as pt is unsure about this, progress as able until plateau.    Stark Bray, PT 02/12/2022, 9:27 PM

## 2022-02-12 NOTE — Patient Instructions (Signed)
PLEASE KEEP YOUR BANDAGES ON AS LONG AS POSSIBLE TO GET THE BEST SWELLING REDUCTION. Should your bandages become uncomfortable or feel too tight, follow these steps: Elevate your extremity higher than your heart.  Try to move your arm or leg joints against the firmness of the bandage to help with moving the fluid and allow the bandages to loosen a bit.  If the bandaging is still is too tight, it is ok to carefully remove the top layer.  There will still be more layers under it that can provide compression to your extremity. Finally, if you STILL have significant pain after trying these steps, it is ok to take the bandage off.  Check your skin carefully for any signs of irritation  PLEASE bring ALL bandage materials back to your next appointment as we will reuse what we can  TAKE CARE OF YOUR BANDAGES SO THEY WILL LAST LONGER AND STAY IN BETTER CONDITION Washing bandages:  Wash periodically using a mild detergent in warm water.  Do not use fabric softener or bleach.  Place bandages in a mesh lingerie bag or in a tied off pillow case and use the gentle cycle of the washing machine or hand wash. If you hand wash, you may want to put them in the spin cycle of your washer to get the extra water out, but make sure you put them in a mesh bag first. Do not wring or stretch them while they are wet.  Drying bandages: Lay the bandages out smoothly on a towel away from direct sunlight or heating sources that can damage the fabric. Rolling bandages in a towel and gently squeezing the towel to remove excess water before laying them out can speed up the process.  If you use a drying rack, place a towel on top of the rack to lay the bandages on.  If they hang down to dry, they fabric could be stretched out and the bandage will lose its compression.   Or, keep bandages in the mesh bag and dry them in the dryer on the low or no heat cycle. Rolling bandages: Please roll your bandages after drying them so they are ready for  your next treatment. If they are rolled too loose, they will be difficult to apply.  If rolled too tight, they can get stretched out.   TAKE CARE OF YOUR SKIN Apply a low pH moisturizing lotion to your skin daily Avoid scratching your skin Treat skin irritations quickly  Know the 5 warning signs of infection: redness, pain, warmth to touch, fever and increased swelling.  Call your physician immediately if you notice any of these signs of a possible infection.  Self bandaging the arm:  This video is for caregiver assistance:  Report[employer].com  -OR-  Enter " MD [place] Lymphedema Management: Caregiver Bandaging your arm" into your search browser and your video should appear in the results

## 2022-02-13 ENCOUNTER — Ambulatory Visit: Payer: Self-pay

## 2022-02-13 ENCOUNTER — Ambulatory Visit (INDEPENDENT_AMBULATORY_CARE_PROVIDER_SITE_OTHER): Payer: Medicare Other | Admitting: Physical Medicine and Rehabilitation

## 2022-02-13 VITALS — BP 159/80 | HR 65

## 2022-02-13 DIAGNOSIS — M5416 Radiculopathy, lumbar region: Secondary | ICD-10-CM

## 2022-02-13 MED ORDER — METHYLPREDNISOLONE ACETATE 80 MG/ML IJ SUSP
80.0000 mg | Freq: Once | INTRAMUSCULAR | Status: AC
  Administered 2022-02-13: 80 mg

## 2022-02-13 NOTE — Progress Notes (Signed)
Numeric Pain Rating Scale and Functional Assessment Average Pain 5   In the last MONTH (on 0-10 scale) has pain interfered with the following?  1. General activity like being  able to carry out your everyday physical activities such as walking, climbing stairs, carrying groceries, or moving a chair?  Rating(7)   +Driver, -BT- Brilinta, -Dye Allergies.  Has spasms with standing, hard to climb stairs.

## 2022-02-13 NOTE — Patient Instructions (Signed)

## 2022-02-14 ENCOUNTER — Encounter: Payer: Self-pay | Admitting: Physical Therapy

## 2022-02-14 ENCOUNTER — Ambulatory Visit: Payer: Medicare Other | Admitting: Physical Therapy

## 2022-02-14 DIAGNOSIS — I890 Lymphedema, not elsewhere classified: Secondary | ICD-10-CM

## 2022-02-14 DIAGNOSIS — C50411 Malignant neoplasm of upper-outer quadrant of right female breast: Secondary | ICD-10-CM

## 2022-02-14 NOTE — Therapy (Signed)
OUTPATIENT PHYSICAL THERAPY ONCOLOGYTREATMENT  Patient Name: MCKENZI BUONOMO MRN: [id number] DOB:Jun 04, 1945, 76 y.o., female Today's Date: 02/14/2022   PT End of Session - 02/14/22 1156     Visit Number 3    Number of Visits 19    Date for PT Re-Evaluation 03/14/22    PT Start Time 4098    PT Stop Time 1302    PT Time Calculation (min) 67 min    Activity Tolerance Patient tolerated treatment well    Behavior During Therapy [facility] for tasks assessed/performed              Past Medical History:  Diagnosis Date   Anxiety    Arthritis 2008   Asthma 1953   age 82   Breast cancer ([place]) dx'd 2017   right   Cancer ([place])    breast cancer   Chronic vaginitis 1998   Fibromyalgia 1982   GERD (gastroesophageal reflux disease)    since at least 2011   Hemorrhoids 2009   History of radiation therapy 01/18/16-03/05/16   right breast/nodal areas 45 Gy, right breast boost 16 Gy   Hypoglycemia 2003   Hypothyroidism 1964   age 26   Interstitial cystitis 1998   Irritable bowel syndrome 06/14/2009   Qualifier: Diagnosis of  By: Patsy Baltimore RN, Denise     Menopausal symptoms    Osteoporosis 12/24/2006   Personal history of chemotherapy    Personal history of radiation therapy    Pneumonia 1979   varicella   PONV (postoperative nausea and vomiting) 1979   Past Surgical History:  Procedure Laterality Date   BLADDER HYPEREXTENSION     X4, prior to 01/2006   [place] LUMPECTOMY Right    2017   BREAST LUMPECTOMY WITH NEEDLE LOCALIZATION AND AXILLARY LYMPH NODE DISSECTION Right 12/05/2015   Procedure: RIGHT BREAST NEEDLE LOCALIZED (WIRES X 2) LUMPECTOMY WITH AXILLARY LYMPH NODE DISSECTION;  Surgeon: Fanny Skates, MD;  Location: [place];  Service: General;  Laterality: Right;   BREAST LUMPECTOMY WITH RADIOACTIVE SEED LOCALIZATION Left 12/05/2015   Procedure: RADIOACTIVE SEED GUIDED LEFT BREAST LUMPECTOMY;  Surgeon: Fanny Skates, MD;  Location: [place];   Service: General;  Laterality: Left;   BREAST SURGERY     BUNIONECTOMY     prior to 01/2006   [place]  02/08/2006   DR. INGRAM   COLONOSCOPY     CORONARY STENT INTERVENTION N/A 01/01/2017   Procedure: CORONARY STENT INTERVENTION;  Surgeon: Jettie Booze, MD;  Location: [place] [facility];  Service: Cardiovascular;  Laterality: N/A;   DENTAL SURGERY  2009-2016   13 dental implants   LEFT HEART CATH AND CORONARY ANGIOGRAPHY N/A 01/01/2017   Procedure: LEFT HEART CATH AND CORONARY ANGIOGRAPHY;  Surgeon: Jettie Booze, MD;  Location: [place] [facility];  Service: Cardiovascular;  Laterality: N/A;   LEG SURGERY  07/06/2004   Broken tibia   NASAL SINUS SURGERY     X4; prior to 01/2006   [facility] REMOVAL Right 12/05/2015   Procedure: REMOVAL PORT-A-CATH;  Surgeon: Fanny Skates, MD;  Location: [place];  Service: General;  Laterality: Right;   PORTACATH PLACEMENT Left 09/08/2015   Procedure: INSERTION OF PORT-A-CATH ;  Surgeon: Fanny Skates, MD;  Location: [place];  Service: General;  Laterality: Left;   Patient Active Problem List   Diagnosis Date Noted   Caregiver stress  07/26/2020   Adjustment disorder with mixed anxiety and depressed mood 12/01/2019   Lumbar radiculopathy 05/11/2019   Bilateral stenosis of lateral recess of lumbar spine 05/11/2019   Hyperlipidemia 12/23/2018   Chronic diastolic CHF (congestive heart failure) ([place]) 11/24/2018   Abnormal transaminases 03/24/2018   CAD (coronary artery disease) 02/11/2017   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   Status post coronary artery stent placement    Non-ST elevation (NSTEMI) myocardial infarction [facility])    Depression with anxiety 12/31/2016   Chest pain 12/31/2016   Hyponatremia 12/31/2016   Chronic pain 12/31/2016   Essential hypertension 12/31/2016   Unstable angina (HCC)    Family history of breast cancer in female 02/08/2016   Rash and nonspecific  skin eruption 10/03/2015   Sensorineural hearing loss (SNHL) of both ears 09/05/2015   Malignant neoplasm of upper-outer quadrant of right breast in female, estrogen receptor positive ([place]) 09/01/2015   Sclerosing adenosis of left breast 09/01/2015   Asthma 09/12/2011   Chronic vaginitis    Fibromyalgia    Interstitial cystitis    UNSPECIFIED HYPOTHYROIDISM 06/14/2009   DEPRESSIVE DISORDER NOT ELSEWHERE CLASSIFIED 06/14/2009   Hereditary and idiopathic peripheral neuropathy 06/14/2009   UNSPEC HEMORRHOIDS WITHOUT MENTION COMPLICATION 16/01/9603   ALLERGIC RHINITIS CAUSE UNSPECIFIED 06/14/2009   ESOPHAGEAL REFLUX 06/14/2009   Irritable bowel syndrome 06/14/2009   INSOMNIA UNSPECIFIED 06/14/2009   Osteoporosis 08/27/2007    PCP: Seward Carol, MD  REFERRING PROVIDER: Wilber Bihari, NP  REFERRING DIAG: Rt UE lymphedema  THERAPY DIAG:  Lymphedema, not elsewhere classified  Malignant neoplasm of upper-outer quadrant of right breast in female, estrogen receptor positive ([place])  ONSET DATE: 12/30/19  Rationale for Evaluation and Treatment Rehabilitation  SUBJECTIVE                                                                                                                                                                                           SUBJECTIVE STATEMENT: There is some claustrophobia that goes along with wearing the bandages.    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 She was treated with neoadjuvant chemotherapy and had a right and left lumpectomy with right axillary lymph node dissection on 12/05/15, with 15 out of 17 lymph nodes being positive. Radiation completed. She has a history of fibromyalgia and osteoporosis. They kept thinking that I had cellulitis in the arm starting 2 years ago and had been off and on antibiotics.  I have a compression sleeve.  CT and doppler negative.    PAIN:  Are you  having pain? No  PRECAUTIONS: Rt lymphedema  WEIGHT BEARING RESTRICTIONS No  FALLS:  Has patient fallen in last 6 months? No  LIVING ENVIRONMENT: Lives with: lives alone with caregiver 24/7 - will be going to [place] next month  Lives in: House/apartment  OCCUPATION: Retired   LEISURE: not discussed  HAND DOMINANCE : right   PRIOR LEVEL OF FUNCTION: uses care aide 24/7  PATIENT GOALS what to do for the swelling   OBJECTIVE  PALPATION: Non pitting  OBSERVATIONS / OTHER ASSESSMENTS: Rt UE significantly larger  POSTURE: rounded shoulders    LYMPHEDEMA ASSESSMENTS:  LANDMARK RIGHT  eval 02/12/22 02/14/22  axilla 33 33   15cm 33.5 33.5 33  10 cm proximal to olecranon process 35 34 33  Olecranon process 28.5 28.5 27  15cm proximal  26 26.2 22.9  10 cm proximal to ulnar styloid process 22.2 23 20.3  Just proximal to ulnar styloid process 15 15 15.7  Across hand at thumb web space 18.3 18.3 17.1  At base of 2nd digit 7.4 6.2 5.9  (Blank rows = not tested) Length of 10 2031m  LANDMARK LEFT  eval  axilla 31  15cm 30  10 cm proximal to olecranon process 28.8  Olecranon process 24.5  15cm 20.8  10 cm proximal to ulnar styloid process 18.2  Just proximal to ulnar styloid process 14  Across hand at thumb web space 17  At base of 2nd digit 5.8  (Blank rows = not tested)   16749m LLIS:  10  TODAY'S TREATMENT  02/14/22:  Education on CDT principles MLD: In supine with 2 pillows: MLD: In supine: Short neck, 5 diaphragmatic breaths, L axillary nodes and establishment of interaxillary pathway, R inguinal nodes and establishment of axilloinguinal pathway, then R UE moving fluid towards pathways spending extra time in any areas of fibrosis then retracing all steps. With education on principles of MLD, use of pump, and answering all questions.   Compression bandaging: tg soft inside out, elastomull to fingers 1-5 then covered with paper tape, artiflex from hand to  axilla, 1 6cm hand bandage, 1 8cm and 1 10cm from wrist to axilla   02/12/22:  Education on CDT principles MLD: In supine with 2 pillows: MLD: In supine: Short neck, 5 diaphragmatic breaths, L axillary nodes and establishment of interaxillary pathway, R inguinal nodes and establishment of axilloinguinal pathway, then R UE moving fluid towards pathways spending extra time in any areas of fibrosis then retracing all steps. With education on principles of MLD, use of pump, and answering all questions.   Compression bandaging: eucerin lotion, tg soft inside out, fingers 1-4, artiflex from hand to axilla, 1 6cm hand bandage, 1 8cm and 1 10cm from wrist to axilla with 8cm in herringbone.  Education throughout on when to remove, bandage care, etc.   01/30/22:   Eval visit discussed POC options showing pt day and night garments, velcro, bandaging options  PATIENT EDUCATION:  Education details: per today's visit Person educated: Patient Education method: ExCustomer service managerducation comprehension: verbalized understanding   HOME EXERCISE PROGRAM: none  ASSESSMENT:  CLINICAL IMPRESSION: Remeasured circumferences and pt demonstrates great reduction today throughout RUE. Continued with MLD followed by compression bandaging.   OBJECTIVE IMPAIRMENTS decreased knowledge of condition, decreased knowledge of use of DME, and increased edema.   ACTIVITY LIMITATIONS lifting  PARTICIPATION LIMITATIONS: meal prep, cleaning, laundry, and community activity  PERSONAL FACTORS Age, Time since onset of injury/illness/exacerbation, and 1-2 comorbidities: sugical and radiation hx  are also affecting patient's functional outcome.  REHAB POTENTIAL: Good  CLINICAL DECISION MAKING: Stable/uncomplicated  EVALUATION COMPLEXITY: Low  GOALS: Goals reviewed with patient? Yes   LONG TERM GOALS: Target date: 03/14/2022    Pt will be educated on lymphedema and CDT principles Baseline:  Goal status:  INITIAL  2.  Pt will decrease UE volume by at least 153m to decrease risk of infection Baseline:  Goal status: INITIAL  3.  Pt will decrease LLIS by 4 points to demonstrate improved status Baseline:  Goal status: INITIAL  4.  Pt will obtain compression for the UE Baseline:  Goal status: INITIAL    PLAN: PT FREQUENCY: 3x/week  PT DURATION: 6 weeks  PLANNED INTERVENTIONS: Therapeutic exercises, Therapeutic activity, Patient/Family education, Self Care, DME instructions, Manual therapy, and Re-evaluation  PLAN FOR NEXT SESSION: Education on CDT principles, start with MLD and light bandaging as pt is unsure about this, progress as able until plateau.    BGuadalupe Regional Medical CenterBDelavan PT 02/14/2022, 1:07 PM

## 2022-02-16 ENCOUNTER — Ambulatory Visit: Payer: Medicare Other | Admitting: Physical Therapy

## 2022-02-16 ENCOUNTER — Encounter: Payer: Self-pay | Admitting: Physical Therapy

## 2022-02-16 DIAGNOSIS — I890 Lymphedema, not elsewhere classified: Secondary | ICD-10-CM | POA: Diagnosis not present

## 2022-02-16 DIAGNOSIS — Z170 Estrogen receptor positive status [ER+]: Secondary | ICD-10-CM

## 2022-02-16 NOTE — Therapy (Signed)
OUTPATIENT PHYSICAL THERAPY ONCOLOGYTREATMENT  Patient Name: SANAIYA WELLIVER MRN: [id number] DOB:10-31-1945, 76 y.o., female Today's Date: 02/16/2022   PT End of Session - 02/16/22 1211     Visit Number 4    Number of Visits 19    Date for PT Re-Evaluation 03/14/22    PT Start Time 1107    PT Stop Time 1205    PT Time Calculation (min) 58 min    Activity Tolerance Patient tolerated treatment well    Behavior During Therapy [facility] for tasks assessed/performed              Past Medical History:  Diagnosis Date   Anxiety    Arthritis 2008   Asthma 1953   age 31   Breast cancer ([place]) dx'd 2017   right   Cancer ([place])    breast cancer   Chronic vaginitis 1998   Fibromyalgia 1982   GERD (gastroesophageal reflux disease)    since at least 2011   Hemorrhoids 2009   History of radiation therapy 01/18/16-03/05/16   right breast/nodal areas 45 Gy, right breast boost 16 Gy   Hypoglycemia 2003   Hypothyroidism 1964   age 34   Interstitial cystitis 1998   Irritable bowel syndrome 06/14/2009   Qualifier: Diagnosis of  By: Patsy Baltimore RN, Denise     Menopausal symptoms    Osteoporosis 12/24/2006   Personal history of chemotherapy    Personal history of radiation therapy    Pneumonia 1979   varicella   PONV (postoperative nausea and vomiting) 1979   Past Surgical History:  Procedure Laterality Date   BLADDER HYPEREXTENSION     X4, prior to 01/2006   [place] LUMPECTOMY Right    2017   BREAST LUMPECTOMY WITH NEEDLE LOCALIZATION AND AXILLARY LYMPH NODE DISSECTION Right 12/05/2015   Procedure: RIGHT BREAST NEEDLE LOCALIZED (WIRES X 2) LUMPECTOMY WITH AXILLARY LYMPH NODE DISSECTION;  Surgeon: Fanny Skates, MD;  Location: [place];  Service: General;  Laterality: Right;   BREAST LUMPECTOMY WITH RADIOACTIVE SEED LOCALIZATION Left 12/05/2015   Procedure: RADIOACTIVE SEED GUIDED LEFT BREAST LUMPECTOMY;  Surgeon: Fanny Skates, MD;  Location: [place];   Service: General;  Laterality: Left;   BREAST SURGERY     BUNIONECTOMY     prior to 01/2006   [place]  02/08/2006   DR. INGRAM   COLONOSCOPY     CORONARY STENT INTERVENTION N/A 01/01/2017   Procedure: CORONARY STENT INTERVENTION;  Surgeon: Jettie Booze, MD;  Location: [place] CV LAB;  Service: Cardiovascular;  Laterality: N/A;   DENTAL SURGERY  2009-2016   13 dental implants   LEFT HEART CATH AND CORONARY ANGIOGRAPHY N/A 01/01/2017   Procedure: LEFT HEART CATH AND CORONARY ANGIOGRAPHY;  Surgeon: Jettie Booze, MD;  Location: [place] [facility];  Service: Cardiovascular;  Laterality: N/A;   LEG SURGERY  07/06/2004   Broken tibia   NASAL SINUS SURGERY     X4; prior to 01/2006   [facility] REMOVAL Right 12/05/2015   Procedure: REMOVAL PORT-A-CATH;  Surgeon: Fanny Skates, MD;  Location: [place];  Service: General;  Laterality: Right;   PORTACATH PLACEMENT Left 09/08/2015   Procedure: INSERTION OF PORT-A-CATH ;  Surgeon: Fanny Skates, MD;  Location: [place];  Service: General;  Laterality: Left;   Patient Active Problem List   Diagnosis Date Noted   Caregiver stress  07/26/2020   Adjustment disorder with mixed anxiety and depressed mood 12/01/2019   Lumbar radiculopathy 05/11/2019   Bilateral stenosis of lateral recess of lumbar spine 05/11/2019   Hyperlipidemia 12/23/2018   Chronic diastolic CHF (congestive heart failure) ([place]) 11/24/2018   Abnormal transaminases 03/24/2018   CAD (coronary artery disease) 02/11/2017   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   Status post coronary artery stent placement    Non-ST elevation (NSTEMI) myocardial infarction [facility])    Depression with anxiety 12/31/2016   Chest pain 12/31/2016   Hyponatremia 12/31/2016   Chronic pain 12/31/2016   Essential hypertension 12/31/2016   Unstable angina (HCC)    Family history of breast cancer in female 02/08/2016   Rash and nonspecific  skin eruption 10/03/2015   Sensorineural hearing loss (SNHL) of both ears 09/05/2015   Malignant neoplasm of upper-outer quadrant of right breast in female, estrogen receptor positive ([place]) 09/01/2015   Sclerosing adenosis of left breast 09/01/2015   Asthma 09/12/2011   Chronic vaginitis    Fibromyalgia    Interstitial cystitis    UNSPECIFIED HYPOTHYROIDISM 06/14/2009   DEPRESSIVE DISORDER NOT ELSEWHERE CLASSIFIED 06/14/2009   Hereditary and idiopathic peripheral neuropathy 06/14/2009   UNSPEC HEMORRHOIDS WITHOUT MENTION COMPLICATION 35/36/1443   ALLERGIC RHINITIS CAUSE UNSPECIFIED 06/14/2009   ESOPHAGEAL REFLUX 06/14/2009   Irritable bowel syndrome 06/14/2009   INSOMNIA UNSPECIFIED 06/14/2009   Osteoporosis 08/27/2007    PCP: Seward Carol, MD  REFERRING PROVIDER: Wilber Bihari, NP  REFERRING DIAG: Rt UE lymphedema  THERAPY DIAG:  Lymphedema, not elsewhere classified  Malignant neoplasm of upper-outer quadrant of right breast in female, estrogen receptor positive ([place])  ONSET DATE: 12/30/19  Rationale for Evaluation and Treatment Rehabilitation  SUBJECTIVE                                                                                                                                                                                           SUBJECTIVE STATEMENT: I was able to leave them on. The paper tape helped.    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 She was treated with neoadjuvant chemotherapy and had a right and left lumpectomy with right axillary lymph node dissection on 12/05/15, with 15 out of 17 lymph nodes being positive. Radiation completed. She has a history of fibromyalgia and osteoporosis. They kept thinking that I had cellulitis in the arm starting 2 years ago and had been off and on antibiotics.  I have a compression sleeve.  CT and doppler negative.    PAIN:  Are you having pain?  No  PRECAUTIONS: Rt lymphedema  WEIGHT BEARING RESTRICTIONS No  FALLS:  Has patient fallen in last 6 months? No  LIVING ENVIRONMENT: Lives with: lives alone with caregiver 24/7 - will be going to [place] next month  Lives in: House/apartment  OCCUPATION: Retired   LEISURE: not discussed  HAND DOMINANCE : right   PRIOR LEVEL OF FUNCTION: uses care aide 24/7  PATIENT GOALS what to do for the swelling   OBJECTIVE  PALPATION: Non pitting  OBSERVATIONS / OTHER ASSESSMENTS: Rt UE significantly larger  POSTURE: rounded shoulders    LYMPHEDEMA ASSESSMENTS:  LANDMARK RIGHT  eval 02/12/22 02/14/22  axilla 33 33   15cm 33.5 33.5 33  10 cm proximal to olecranon process 35 34 33  Olecranon process 28.5 28.5 27  15cm proximal  26 26.2 22.9  10 cm proximal to ulnar styloid process 22.2 23 20.3  Just proximal to ulnar styloid process 15 15 15.7  Across hand at thumb web space 18.3 18.3 17.1  At base of 2nd digit 7.4 6.2 5.9  (Blank rows = not tested) Length of 10 2056m  LANDMARK LEFT  eval  axilla 31  15cm 30  10 cm proximal to olecranon process 28.8  Olecranon process 24.5  15cm 20.8  10 cm proximal to ulnar styloid process 18.2  Just proximal to ulnar styloid process 14  Across hand at thumb web space 17  At base of 2nd digit 5.8  (Blank rows = not tested)   16765m LLIS:  10  TODAY'S TREATMENT  02/16/22:  Removed bandages and pt washed arm and hand with soap and water prior to MLD.  MLD: In supine with 2 pillows: MLD: In supine: Short neck, 5 diaphragmatic breaths, L axillary nodes and establishment of interaxillary pathway, R inguinal nodes and establishment of axilloinguinal pathway, then R UE moving fluid towards pathways spending extra time in any areas of fibrosis then retracing all steps. With education on principles of MLD, use of pump, and answering all questions.   Compression bandaging: tg soft inside out, elastomull to fingers 1-5 then covered  with paper tape, artiflex from hand to axilla, 1 6cm hand bandage, 1 8cm in herringbone fashion and 1 10cm from wrist to axilla   02/14/22:  Education on CDT principles MLD: In supine with 2 pillows: MLD: In supine: Short neck, 5 diaphragmatic breaths, L axillary nodes and establishment of interaxillary pathway, R inguinal nodes and establishment of axilloinguinal pathway, then R UE moving fluid towards pathways spending extra time in any areas of fibrosis then retracing all steps. With education on principles of MLD, use of pump, and answering all questions.   Compression bandaging: tg soft inside out, elastomull to fingers 1-5 then covered with paper tape, artiflex from hand to axilla, 1 6cm hand bandage, 1 8cm and 1 10cm from wrist to axilla   02/12/22:  Education on CDT principles MLD: In supine with 2 pillows: MLD: In supine: Short neck, 5 diaphragmatic breaths, L axillary nodes and establishment of interaxillary pathway, R inguinal nodes and establishment of axilloinguinal pathway, then R UE moving fluid towards pathways spending extra time in any areas of fibrosis then retracing all steps. With education on principles of MLD, use of pump, and answering all questions.   Compression bandaging: eucerin lotion, tg soft inside out, fingers 1-4, artiflex from hand to axilla, 1 6cm hand bandage, 1 8cm and 1 10cm from wrist to axilla with 8cm in herringbone.  Education throughout on when to remove, bandage care, etc.   01/30/22:  Eval visit discussed POC options showing pt day and night garments, velcro, bandaging options  PATIENT EDUCATION:  Education details: per today's visit Person educated: Patient Education method: Customer service manager Education comprehension: verbalized understanding   HOME EXERCISE PROGRAM: none  ASSESSMENT:  CLINICAL IMPRESSION: Pt continues to demonstrate reduction per visual estimate today. Continued with complete CDT today. Will begin discussing  compression garment options once pt approaches maximal reduction.   OBJECTIVE IMPAIRMENTS decreased knowledge of condition, decreased knowledge of use of DME, and increased edema.   ACTIVITY LIMITATIONS lifting  PARTICIPATION LIMITATIONS: meal prep, cleaning, laundry, and community activity  PERSONAL FACTORS Age, Time since onset of injury/illness/exacerbation, and 1-2 comorbidities: sugical and radiation hx  are also affecting patient's functional outcome.   REHAB POTENTIAL: Good  CLINICAL DECISION MAKING: Stable/uncomplicated  EVALUATION COMPLEXITY: Low  GOALS: Goals reviewed with patient? Yes   LONG TERM GOALS: Target date: 03/14/2022    Pt will be educated on lymphedema and CDT principles Baseline:  Goal status: INITIAL  2.  Pt will decrease UE volume by at least 158m to decrease risk of infection Baseline:  Goal status: INITIAL  3.  Pt will decrease LLIS by 4 points to demonstrate improved status Baseline:  Goal status: INITIAL  4.  Pt will obtain compression for the UE Baseline:  Goal status: INITIAL    PLAN: PT FREQUENCY: 3x/week  PT DURATION: 6 weeks  PLANNED INTERVENTIONS: Therapeutic exercises, Therapeutic activity, Patient/Family education, Self Care, DME instructions, Manual therapy, and Re-evaluation  PLAN FOR NEXT SESSION: add grey foam to antecubital fossa, Education on CDT principles, start with MLD and light bandaging as pt is unsure about this, progress as able until plateau.    [facility] PT 02/16/2022, 12:18 PM

## 2022-02-19 ENCOUNTER — Ambulatory Visit: Payer: Medicare Other | Admitting: Rehabilitation

## 2022-02-19 ENCOUNTER — Encounter: Payer: Self-pay | Admitting: Rehabilitation

## 2022-02-19 DIAGNOSIS — I890 Lymphedema, not elsewhere classified: Secondary | ICD-10-CM

## 2022-02-19 DIAGNOSIS — C50411 Malignant neoplasm of upper-outer quadrant of right female breast: Secondary | ICD-10-CM

## 2022-02-19 NOTE — Therapy (Signed)
OUTPATIENT PHYSICAL THERAPY ONCOLOGYTREATMENT  Patient Name: [name] MRN: [id number] DOB:11-22-1945, 76 y.o., female Today's Date: 02/19/2022   PT End of Session - 02/19/22 1256     Visit Number 5    Number of Visits 19    Date for PT Re-Evaluation 03/14/22    PT Start Time 1203    PT Stop Time 3716    PT Time Calculation (min) 53 min    Activity Tolerance Patient tolerated treatment well    Behavior During Therapy [facility] for tasks assessed/performed              Past Medical History:  Diagnosis Date   Anxiety    Arthritis 2008   Asthma 1953   age 17   Breast cancer ([place]) dx'd 2017   right   Cancer ([place])    breast cancer   Chronic vaginitis 1998   Fibromyalgia 1982   GERD (gastroesophageal reflux disease)    since at least 2011   Hemorrhoids 2009   History of radiation therapy 01/18/16-03/05/16   right breast/nodal areas 45 Gy, right breast boost 16 Gy   Hypoglycemia 2003   Hypothyroidism 1964   age 35   Interstitial cystitis 1998   Irritable bowel syndrome 06/14/2009   Qualifier: Diagnosis of  By: Patsy Baltimore RN, Denise     Menopausal symptoms    Osteoporosis 12/24/2006   Personal history of chemotherapy    Personal history of radiation therapy    Pneumonia 1979   varicella   PONV (postoperative nausea and vomiting) 1979   Past Surgical History:  Procedure Laterality Date   BLADDER HYPEREXTENSION     X4, prior to 01/2006   [place] LUMPECTOMY Right    2017   BREAST LUMPECTOMY WITH NEEDLE LOCALIZATION AND AXILLARY LYMPH NODE DISSECTION Right 12/05/2015   Procedure: RIGHT BREAST NEEDLE LOCALIZED (WIRES X 2) LUMPECTOMY WITH AXILLARY LYMPH NODE DISSECTION;  Surgeon: Fanny Skates, MD;  Location: [place];  Service: General;  Laterality: Right;   BREAST LUMPECTOMY WITH RADIOACTIVE SEED LOCALIZATION Left 12/05/2015   Procedure: RADIOACTIVE SEED GUIDED LEFT BREAST LUMPECTOMY;  Surgeon: Fanny Skates, MD;  Location: [place];   Service: General;  Laterality: Left;   BREAST SURGERY     BUNIONECTOMY     prior to 01/2006   [place]  02/08/2006   DR. INGRAM   COLONOSCOPY     CORONARY STENT INTERVENTION N/A 01/01/2017   Procedure: CORONARY STENT INTERVENTION;  Surgeon: Jettie Booze, MD;  Location: [place] CV LAB;  Service: Cardiovascular;  Laterality: N/A;   DENTAL SURGERY  2009-2016   13 dental implants   LEFT HEART CATH AND CORONARY ANGIOGRAPHY N/A 01/01/2017   Procedure: LEFT HEART CATH AND CORONARY ANGIOGRAPHY;  Surgeon: Jettie Booze, MD;  Location: [place] CV LAB;  Service: Cardiovascular;  Laterality: N/A;   LEG SURGERY  07/06/2004   Broken tibia   NASAL SINUS SURGERY     X4; prior to 01/2006   [facility] REMOVAL Right 12/05/2015   Procedure: REMOVAL PORT-A-CATH;  Surgeon: Fanny Skates, MD;  Location: [place];  Service: General;  Laterality: Right;   PORTACATH PLACEMENT Left 09/08/2015   Procedure: INSERTION OF PORT-A-CATH ;  Surgeon: Fanny Skates, MD;  Location: [place];  Service: General;  Laterality: Left;   Patient Active Problem List   Diagnosis Date Noted   Caregiver stress  07/26/2020   Adjustment disorder with mixed anxiety and depressed mood 12/01/2019   Lumbar radiculopathy 05/11/2019   Bilateral stenosis of lateral recess of lumbar spine 05/11/2019   Hyperlipidemia 12/23/2018   Chronic diastolic CHF (congestive heart failure) ([place]) 11/24/2018   Abnormal transaminases 03/24/2018   CAD (coronary artery disease) 02/11/2017   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   Status post coronary artery stent placement    Non-ST elevation (NSTEMI) myocardial infarction [facility])    Depression with anxiety 12/31/2016   Chest pain 12/31/2016   Hyponatremia 12/31/2016   Chronic pain 12/31/2016   Essential hypertension 12/31/2016   Unstable angina (HCC)    Family history of breast cancer in female 02/08/2016   Rash and nonspecific  skin eruption 10/03/2015   Sensorineural hearing loss (SNHL) of both ears 09/05/2015   Malignant neoplasm of upper-outer quadrant of right breast in female, estrogen receptor positive ([place]) 09/01/2015   Sclerosing adenosis of left breast 09/01/2015   Asthma 09/12/2011   Chronic vaginitis    Fibromyalgia    Interstitial cystitis    UNSPECIFIED HYPOTHYROIDISM 06/14/2009   DEPRESSIVE DISORDER NOT ELSEWHERE CLASSIFIED 06/14/2009   Hereditary and idiopathic peripheral neuropathy 06/14/2009   UNSPEC HEMORRHOIDS WITHOUT MENTION COMPLICATION 41/66/0630   ALLERGIC RHINITIS CAUSE UNSPECIFIED 06/14/2009   ESOPHAGEAL REFLUX 06/14/2009   Irritable bowel syndrome 06/14/2009   INSOMNIA UNSPECIFIED 06/14/2009   Osteoporosis 08/27/2007    PCP: Seward Carol, MD  REFERRING PROVIDER: Wilber Bihari, NP  REFERRING DIAG: Rt UE lymphedema  THERAPY DIAG:  Lymphedema, not elsewhere classified  Malignant neoplasm of upper-outer quadrant of right breast in female, estrogen receptor positive ([place])  ONSET DATE: 12/30/19  Rationale for Evaluation and Treatment Rehabilitation  SUBJECTIVE                                                                                                                                                                                           SUBJECTIVE STATEMENT: I am doing it, but it is hard to get ready for my move, what is the earliest we can do a regular sleeve.   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 She was treated with neoadjuvant chemotherapy and had a right and left lumpectomy with right axillary lymph node dissection on 12/05/15, with 15 out of 17 lymph nodes being positive. Radiation completed. She has a history of fibromyalgia and osteoporosis. They kept thinking that I had cellulitis in the arm starting 2 years ago and had been off and on antibiotics.  I have a compression sleeve.  CT and doppler  negative.  PAIN:  Are you having pain? No  PRECAUTIONS: Rt lymphedema  WEIGHT BEARING RESTRICTIONS No  FALLS:  Has patient fallen in last 6 months? No  LIVING ENVIRONMENT: Lives with: lives alone with caregiver 24/7 - will be going to [place] next month  Lives in: House/apartment  OCCUPATION: Retired   LEISURE: not discussed  HAND DOMINANCE : right   PRIOR LEVEL OF FUNCTION: uses care aide 24/7  PATIENT GOALS what to do for the swelling   OBJECTIVE  PALPATION: Non pitting  OBSERVATIONS / OTHER ASSESSMENTS: Rt UE significantly larger  POSTURE: rounded shoulders    LYMPHEDEMA ASSESSMENTS:  LANDMARK RIGHT  eval 02/12/22 02/14/22 02/19/22  axilla 33 33  31  15cm 33.5 33.5 33 32  10 cm proximal to olecranon process 35 34 33 32  Olecranon process 28.5 28.5 27 27.2  15cm proximal  26 26.2 22.9 23.5  10 cm proximal to ulnar styloid process 22.2 23 20.3 20.7  Just proximal to ulnar styloid process 15 15 15.7 15.0  Across hand at thumb web space 18.3 18.3 17.1 17.4  At base of 2nd digit 7.4 6.2 5.9 5.9  (Blank rows = not tested) Length of 10 2044m  LANDMARK LEFT  eval  axilla 31  15cm 30  10 cm proximal to olecranon process 28.8  Olecranon process 24.5  15cm 20.8  10 cm proximal to ulnar styloid process 18.2  Just proximal to ulnar styloid process 14  Across hand at thumb web space 17  At base of 2nd digit 5.8  (Blank rows = not tested)   16775m LLIS:  10  TODAY'S TREATMENT  02/19/22:  Removed bandages and pt washed arm and hand with soap and water  Remeasured UE - discussed compression options - compared Rt UE size to medi mondi size chart and pt would be in a size 2 unless reducing quite a bit so she would like to order this size 2 long with a matching gauntlet to have ready until she gets a custom flat knit in a color she would like.   Pt was given compression guru information.  Also discussed night garment which pt would like to wait on at this  time.   Compression bandaging: tg soft inside out, elastomull to fingers 1-4 then covered with paper tape, artiflex from hand to axilla, rosidal soft rectangle used at antecubital fossa for padding, 2nd set of 1 6cm hand bandage, 1 8cm in herringbone fashion and 1 10cm from wrist to axilla given.   02/16/22:  Removed bandages and pt washed arm and hand with soap and water prior to MLD.  MLD: In supine with 2 pillows: MLD: In supine: Short neck, 5 diaphragmatic breaths, L axillary nodes and establishment of interaxillary pathway, R inguinal nodes and establishment of axilloinguinal pathway, then R UE moving fluid towards pathways spending extra time in any areas of fibrosis then retracing all steps. With education on principles of MLD, use of pump, and answering all questions.   Compression bandaging: tg soft inside out, elastomull to fingers 1-5 then covered with paper tape, artiflex from hand to axilla, 1 6cm hand bandage, 1 8cm in herringbone fashion and 1 10cm from wrist to axilla   02/14/22:  Education on CDT principles MLD: In supine with 2 pillows: MLD: In supine: Short neck, 5 diaphragmatic breaths, L axillary nodes and establishment of interaxillary pathway, R inguinal nodes and establishment of axilloinguinal pathway, then R UE moving fluid towards pathways spending extra time in any  areas of fibrosis then retracing all steps. With education on principles of MLD, use of pump, and answering all questions.   Compression bandaging: tg soft inside out, elastomull to fingers 1-5 then covered with paper tape, artiflex from hand to axilla, 1 6cm hand bandage, 1 8cm and 1 10cm from wrist to axilla   02/12/22:  Education on CDT principles MLD: In supine with 2 pillows: MLD: In supine: Short neck, 5 diaphragmatic breaths, L axillary nodes and establishment of interaxillary pathway, R inguinal nodes and establishment of axilloinguinal pathway, then R UE moving fluid towards pathways spending extra time  in any areas of fibrosis then retracing all steps. With education on principles of MLD, use of pump, and answering all questions.   Compression bandaging: eucerin lotion, tg soft inside out, fingers 1-4, artiflex from hand to axilla, 1 6cm hand bandage, 1 8cm and 1 10cm from wrist to axilla with 8cm in herringbone.  Education throughout on when to remove, bandage care, etc.   01/30/22:   Eval visit discussed POC options showing pt day and night garments, velcro, bandaging options  PATIENT EDUCATION:  Education details: per today's visit Person educated: Patient Education method: Customer service manager Education comprehension: verbalized understanding   HOME EXERCISE PROGRAM: None  GARMENTS: ordering OTS size 2 long medi mondi and size 2 medi harmony gauntlet to have ready, would like to wait on night garment, and would like to get a custom made - Okay with self pay for all.    ASSESSMENT:  CLINICAL IMPRESSION: Pt continues to demonstrate reduction but in smaller increments.  Pt is very ready to stop bandaging so she may order an OTS mondi and gauntlet to have with the ultimate goal of getting into a custom flat knit of a different color.   Continued with complete CDT today.   OBJECTIVE IMPAIRMENTS decreased knowledge of condition, decreased knowledge of use of DME, and increased edema.   ACTIVITY LIMITATIONS lifting  PARTICIPATION LIMITATIONS: meal prep, cleaning, laundry, and community activity  PERSONAL FACTORS Age, Time since onset of injury/illness/exacerbation, and 1-2 comorbidities: sugical and radiation hx  are also affecting patient's functional outcome.   REHAB POTENTIAL: Good  CLINICAL DECISION MAKING: Stable/uncomplicated  EVALUATION COMPLEXITY: Low  GOALS: Goals reviewed with patient? Yes   LONG TERM GOALS: Target date: 03/14/2022    Pt will be educated on lymphedema and CDT principles Baseline:  Goal status: INITIAL  2.  Pt will decrease UE volume by  at least 131m to decrease risk of infection Baseline:  Goal status: INITIAL  3.  Pt will decrease LLIS by 4 points to demonstrate improved status Baseline:  Goal status: INITIAL  4.  Pt will obtain compression for the UE Baseline:  Goal status: INITIAL    PLAN: PT FREQUENCY: 3x/week  PT DURATION: 6 weeks  PLANNED INTERVENTIONS: Therapeutic exercises, Therapeutic activity, Patient/Family education, Self Care, DME instructions, Manual therapy, and Re-evaluation  PLAN FOR NEXT SESSION:  CDT Lt UE, start teaching MLD, progress as able until plateau.    TStark Bray PT 02/19/2022, 12:57 PM

## 2022-02-21 ENCOUNTER — Encounter: Payer: Self-pay | Admitting: Physical Therapy

## 2022-02-21 ENCOUNTER — Ambulatory Visit: Payer: Medicare Other | Admitting: Physical Therapy

## 2022-02-21 ENCOUNTER — Other Ambulatory Visit: Payer: Self-pay | Admitting: Diagnostic Radiology

## 2022-02-21 DIAGNOSIS — Z853 Personal history of malignant neoplasm of breast: Secondary | ICD-10-CM

## 2022-02-21 DIAGNOSIS — I890 Lymphedema, not elsewhere classified: Secondary | ICD-10-CM

## 2022-02-21 DIAGNOSIS — Z170 Estrogen receptor positive status [ER+]: Secondary | ICD-10-CM

## 2022-02-21 NOTE — Therapy (Signed)
OUTPATIENT PHYSICAL THERAPY ONCOLOGYTREATMENT  Patient Name: [name] MRN: [id number] DOB:Sep 16, 1945, 76 y.o., female Today's Date: 02/21/2022   PT End of Session - 02/21/22 1404     Visit Number 6    Number of Visits 19    Date for PT Re-Evaluation 03/14/22    PT Start Time 5361    PT Stop Time 1502    PT Time Calculation (min) 59 min    Activity Tolerance Patient tolerated treatment well    Behavior During Therapy [facility] for tasks assessed/performed              Past Medical History:  Diagnosis Date   Anxiety    Arthritis 2008   Asthma 1953   age 13   Breast cancer ([place]) dx'd 2017   right   Cancer ([place])    breast cancer   Chronic vaginitis 1998   Fibromyalgia 1982   GERD (gastroesophageal reflux disease)    since at least 2011   Hemorrhoids 2009   History of radiation therapy 01/18/16-03/05/16   right breast/nodal areas 45 Gy, right breast boost 16 Gy   Hypoglycemia 2003   Hypothyroidism 1964   age 62   Interstitial cystitis 1998   Irritable bowel syndrome 06/14/2009   Qualifier: Diagnosis of  By: Patsy Baltimore RN, Denise     Menopausal symptoms    Osteoporosis 12/24/2006   Personal history of chemotherapy    Personal history of radiation therapy    Pneumonia 1979   varicella   PONV (postoperative nausea and vomiting) 1979   Past Surgical History:  Procedure Laterality Date   BLADDER HYPEREXTENSION     X4, prior to 01/2006   [place] LUMPECTOMY Right    2017   BREAST LUMPECTOMY WITH NEEDLE LOCALIZATION AND AXILLARY LYMPH NODE DISSECTION Right 12/05/2015   Procedure: RIGHT BREAST NEEDLE LOCALIZED (WIRES X 2) LUMPECTOMY WITH AXILLARY LYMPH NODE DISSECTION;  Surgeon: Fanny Skates, MD;  Location: [place];  Service: General;  Laterality: Right;   BREAST LUMPECTOMY WITH RADIOACTIVE SEED LOCALIZATION Left 12/05/2015   Procedure: RADIOACTIVE SEED GUIDED LEFT BREAST LUMPECTOMY;  Surgeon: Fanny Skates, MD;  Location: [place];   Service: General;  Laterality: Left;   BREAST SURGERY     BUNIONECTOMY     prior to 01/2006   [place]  02/08/2006   DR. INGRAM   COLONOSCOPY     CORONARY STENT INTERVENTION N/A 01/01/2017   Procedure: CORONARY STENT INTERVENTION;  Surgeon: Jettie Booze, MD;  Location: [place] [facility];  Service: Cardiovascular;  Laterality: N/A;   DENTAL SURGERY  2009-2016   13 dental implants   LEFT HEART CATH AND CORONARY ANGIOGRAPHY N/A 01/01/2017   Procedure: LEFT HEART CATH AND CORONARY ANGIOGRAPHY;  Surgeon: Jettie Booze, MD;  Location: [place] [facility];  Service: Cardiovascular;  Laterality: N/A;   LEG SURGERY  07/06/2004   Broken tibia   NASAL SINUS SURGERY     X4; prior to 01/2006   [facility] REMOVAL Right 12/05/2015   Procedure: REMOVAL PORT-A-CATH;  Surgeon: Fanny Skates, MD;  Location: [place];  Service: General;  Laterality: Right;   PORTACATH PLACEMENT Left 09/08/2015   Procedure: INSERTION OF PORT-A-CATH ;  Surgeon: Fanny Skates, MD;  Location: [place];  Service: General;  Laterality: Left;   Patient Active Problem List   Diagnosis Date Noted   Caregiver stress  07/26/2020   Adjustment disorder with mixed anxiety and depressed mood 12/01/2019   Lumbar radiculopathy 05/11/2019   Bilateral stenosis of lateral recess of lumbar spine 05/11/2019   Hyperlipidemia 12/23/2018   Chronic diastolic CHF (congestive heart failure) ([place]) 11/24/2018   Abnormal transaminases 03/24/2018   CAD (coronary artery disease) 02/11/2017   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   Status post coronary artery stent placement    Non-ST elevation (NSTEMI) myocardial infarction [facility])    Depression with anxiety 12/31/2016   Chest pain 12/31/2016   Hyponatremia 12/31/2016   Chronic pain 12/31/2016   Essential hypertension 12/31/2016   Unstable angina (HCC)    Family history of breast cancer in female 02/08/2016   Rash and nonspecific  skin eruption 10/03/2015   Sensorineural hearing loss (SNHL) of both ears 09/05/2015   Malignant neoplasm of upper-outer quadrant of right breast in female, estrogen receptor positive () 09/01/2015   Sclerosing adenosis of left breast 09/01/2015   Asthma 09/12/2011   Chronic vaginitis    Fibromyalgia    Interstitial cystitis    UNSPECIFIED HYPOTHYROIDISM 06/14/2009   DEPRESSIVE DISORDER NOT ELSEWHERE CLASSIFIED 06/14/2009   Hereditary and idiopathic peripheral neuropathy 06/14/2009   UNSPEC HEMORRHOIDS WITHOUT MENTION COMPLICATION 22/05/5425   ALLERGIC RHINITIS CAUSE UNSPECIFIED 06/14/2009   ESOPHAGEAL REFLUX 06/14/2009   Irritable bowel syndrome 06/14/2009   INSOMNIA UNSPECIFIED 06/14/2009   Osteoporosis 08/27/2007    PCP: Seward Carol, MD  REFERRING PROVIDER: Wilber Bihari, NP  REFERRING DIAG: Rt UE lymphedema  THERAPY DIAG:  Lymphedema, not elsewhere classified  Malignant neoplasm of upper-outer quadrant of right breast in female, estrogen receptor positive ([place])  ONSET DATE: 12/30/19  Rationale for Evaluation and Treatment Rehabilitation  SUBJECTIVE                                                                                                                                                                                           SUBJECTIVE STATEMENT: I ordered a sleeve.   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 She was treated with neoadjuvant chemotherapy and had a right and left lumpectomy with right axillary lymph node dissection on 12/05/15, with 15 out of 17 lymph nodes being positive. Radiation completed. She has a history of fibromyalgia and osteoporosis. They kept thinking that I had cellulitis in the arm starting 2 years ago and had been off and on antibiotics.  I have a compression sleeve.  CT and doppler negative.    PAIN:  Are you having pain? No  PRECAUTIONS: Rt  lymphedema  WEIGHT BEARING RESTRICTIONS No  FALLS:  Has patient fallen in last 6 months? No  LIVING ENVIRONMENT: Lives with: lives alone with caregiver 24/7 - will be going to [place] next month  Lives in: House/apartment  OCCUPATION: Retired   LEISURE: not discussed  HAND DOMINANCE : right   PRIOR LEVEL OF FUNCTION: uses care aide 24/7  PATIENT GOALS what to do for the swelling   OBJECTIVE  PALPATION: Non pitting  OBSERVATIONS / OTHER ASSESSMENTS: Rt UE significantly larger  POSTURE: rounded shoulders    LYMPHEDEMA ASSESSMENTS:  LANDMARK RIGHT  eval 02/12/22 02/14/22 02/19/22 02/21/22  axilla 33 33  31 31.9  15cm 33.5 33.5 33 32 33  10 cm proximal to olecranon process 35 34 33 32 32  Olecranon process 28.5 28.5 27 27.2 27.1  15cm proximal  26 26.2 22.9 23.5 23.5  10 cm proximal to ulnar styloid process 22.2 23 20.3 20.7 21.4  Just proximal to ulnar styloid process 15 15 15.7 15.0 15  Across hand at thumb web space 18.3 18.3 17.1 17.4 17  At base of 2nd digit 7.4 6.2 5.9 5.9 5.9  (Blank rows = not tested) Length of 10 2064m  LANDMARK LEFT  eval  axilla 31  15cm 30  10 cm proximal to olecranon process 28.8  Olecranon process 24.5  15cm 20.8  10 cm proximal to ulnar styloid process 18.2  Just proximal to ulnar styloid process 14  Across hand at thumb web space 17  At base of 2nd digit 5.8  (Blank rows = not tested)   16782m LLIS:  10  TODAY'S TREATMENT  02/21/22:  Removed bandages and pt washed arm and hand with soap and water  Remeasured UE - discussed night time compression options - Educated pt about importance of a night time garment. Showed pt several different night time garment options and pt was agreeable to a velcro garment because it was not as thick as a tribute night or circaid profile. She fit in to a medium L&R arm wrap - avg length and a small L&R gauntlet. She was given info to obtain from compression guru.  Compression bandaging:  tg soft inside out, elastomull to fingers 1-4 then covered with paper tape, artiflex from hand to axilla, 1/2 grey foam used at antecubital fossa for padding, 2nd set of 1 6cm hand bandage, 1 8cm in herringbone fashion and 1 10cm from wrist to axilla given.  02/19/22:  Removed bandages and pt washed arm and hand with soap and water  Remeasured UE - discussed compression options - compared Rt UE size to medi mondi size chart and pt would be in a size 2 unless reducing quite a bit so she would like to order this size 2 long with a matching gauntlet to have ready until she gets a custom flat knit in a color she would like.   Pt was given compression guru information.  Also discussed night garment which pt would like to wait on at this time.   Compression bandaging: tg soft inside out, elastomull to fingers 1-4 then covered with paper tape, artiflex from hand to axilla, rosidal soft rectangle used at antecubital fossa for padding, 2nd set of 1 6cm hand bandage, 1 8cm in herringbone fashion and 1 10cm from wrist to axilla given.   02/16/22:  Removed bandages and pt washed arm and hand with soap and water prior to MLD.  MLD: In supine with 2 pillows: MLD: In supine: Short neck, 5 diaphragmatic breaths, L axillary nodes and establishment of  interaxillary pathway, R inguinal nodes and establishment of axilloinguinal pathway, then R UE moving fluid towards pathways spending extra time in any areas of fibrosis then retracing all steps. With education on principles of MLD, use of pump, and answering all questions.   Compression bandaging: tg soft inside out, elastomull to fingers 1-5 then covered with paper tape, artiflex from hand to axilla, 1 6cm hand bandage, 1 8cm in herringbone fashion and 1 10cm from wrist to axilla   02/14/22:  Education on CDT principles MLD: In supine with 2 pillows: MLD: In supine: Short neck, 5 diaphragmatic breaths, L axillary nodes and establishment of interaxillary pathway, R  inguinal nodes and establishment of axilloinguinal pathway, then R UE moving fluid towards pathways spending extra time in any areas of fibrosis then retracing all steps. With education on principles of MLD, use of pump, and answering all questions.   Compression bandaging: tg soft inside out, elastomull to fingers 1-5 then covered with paper tape, artiflex from hand to axilla, 1 6cm hand bandage, 1 8cm and 1 10cm from wrist to axilla   02/12/22:  Education on CDT principles MLD: In supine with 2 pillows: MLD: In supine: Short neck, 5 diaphragmatic breaths, L axillary nodes and establishment of interaxillary pathway, R inguinal nodes and establishment of axilloinguinal pathway, then R UE moving fluid towards pathways spending extra time in any areas of fibrosis then retracing all steps. With education on principles of MLD, use of pump, and answering all questions.   Compression bandaging: eucerin lotion, tg soft inside out, fingers 1-4, artiflex from hand to axilla, 1 6cm hand bandage, 1 8cm and 1 10cm from wrist to axilla with 8cm in herringbone.  Education throughout on when to remove, bandage care, etc.   01/30/22:   Eval visit discussed POC options showing pt day and night garments, velcro, bandaging options  PATIENT EDUCATION:  Education details: per today's visit Person educated: Patient Education method: Customer service manager Education comprehension: verbalized understanding   HOME EXERCISE PROGRAM: None  GARMENTS: ordering OTS size 2 long medi mondi and size 2 medi harmony gauntlet to have ready, would like to wait on night garment, and would like to get a custom made - Okay with self pay for all.  Pt ordering L&R ready wrap for her arm and gauntlet from compression guru.   ASSESSMENT:  CLINICAL IMPRESSION: Pt seems to be reaching maximal reduction as her measurements have not changed much since last session. Educated pt in the importance of a night time garment to prevent  fluid from continuing to accumulate in her arm overnight. Pt agreeable to a velcro garment and was issued info on obtaining.   OBJECTIVE IMPAIRMENTS decreased knowledge of condition, decreased knowledge of use of DME, and increased edema.   ACTIVITY LIMITATIONS lifting  PARTICIPATION LIMITATIONS: meal prep, cleaning, laundry, and community activity  PERSONAL FACTORS Age, Time since onset of injury/illness/exacerbation, and 1-2 comorbidities: sugical and radiation hx  are also affecting patient's functional outcome.   REHAB POTENTIAL: Good  CLINICAL DECISION MAKING: Stable/uncomplicated  EVALUATION COMPLEXITY: Low  GOALS: Goals reviewed with patient? Yes   LONG TERM GOALS: Target date: 03/14/2022    Pt will be educated on lymphedema and CDT principles Baseline:  Goal status: INITIAL  2.  Pt will decrease UE volume by at least 133m to decrease risk of infection Baseline:  Goal status: INITIAL  3.  Pt will decrease LLIS by 4 points to demonstrate improved status Baseline:  Goal status: INITIAL  4.  Pt will obtain compression for the UE Baseline:  Goal status: INITIAL    PLAN: PT FREQUENCY: 3x/week  PT DURATION: 6 weeks  PLANNED INTERVENTIONS: Therapeutic exercises, Therapeutic activity, Patient/Family education, Self Care, DME instructions, Manual therapy, and Re-evaluation  PLAN FOR NEXT SESSION:  CDT Lt UE, start teaching MLD, progress as able until plateau. - did she receive garments?   Allyson Sabal Clinton, PT 02/21/2022, 4:15 PM

## 2022-02-23 ENCOUNTER — Other Ambulatory Visit: Payer: Self-pay | Admitting: *Deleted

## 2022-02-23 ENCOUNTER — Ambulatory Visit: Payer: Medicare Other | Admitting: Physical Therapy

## 2022-02-23 DIAGNOSIS — I890 Lymphedema, not elsewhere classified: Secondary | ICD-10-CM | POA: Diagnosis not present

## 2022-02-23 DIAGNOSIS — Z170 Estrogen receptor positive status [ER+]: Secondary | ICD-10-CM

## 2022-02-23 MED ORDER — ANASTROZOLE 1 MG PO TABS: 1.0000 mg | ORAL_TABLET | Freq: Every day | ORAL | 3 refills | Status: DC

## 2022-02-23 NOTE — Therapy (Signed)
OUTPATIENT PHYSICAL THERAPY ONCOLOGYTREATMENT  Patient Name: [name] MRN: [id number] DOB:[birth year], 76 y.o., female Today's Date: 02/23/2022   PT End of Session - 02/23/22 1131     Visit Number 7    Number of Visits 19    Date for PT Re-Evaluation 03/14/22    PT Start Time 1950    PT Stop Time 1130    PT Time Calculation (min) 28 min    Activity Tolerance Patient tolerated treatment well    Behavior During Therapy [facility] for tasks assessed/performed               Past Medical History:  Diagnosis Date   Anxiety    Arthritis 2008   Asthma 1953   age 51   Breast cancer ([place]) dx'd 2017   right   Cancer ([place])    breast cancer   Chronic vaginitis 1998   Fibromyalgia 1982   GERD (gastroesophageal reflux disease)    since at least 2011   Hemorrhoids 2009   History of radiation therapy 01/18/16-03/05/16   right breast/nodal areas 45 Gy, right breast boost 16 Gy   Hypoglycemia 2003   Hypothyroidism 1964   age 29   Interstitial cystitis 1998   Irritable bowel syndrome 06/14/2009   Qualifier: Diagnosis of  By: Patsy Baltimore RN, Denise     Menopausal symptoms    Osteoporosis 12/24/2006   Personal history of chemotherapy    Personal history of radiation therapy    Pneumonia 1979   varicella   PONV (postoperative nausea and vomiting) 1979   Past Surgical History:  Procedure Laterality Date   BLADDER HYPEREXTENSION     X4, prior to 01/2006   [place] LUMPECTOMY Right    2017   BREAST LUMPECTOMY WITH NEEDLE LOCALIZATION AND AXILLARY LYMPH NODE DISSECTION Right 12/05/2015   Procedure: RIGHT BREAST NEEDLE LOCALIZED (WIRES X 2) LUMPECTOMY WITH AXILLARY LYMPH NODE DISSECTION;  Surgeon: Fanny Skates, MD;  Location: [place];  Service: General;  Laterality: Right;   BREAST LUMPECTOMY WITH RADIOACTIVE SEED LOCALIZATION Left 12/05/2015   Procedure: RADIOACTIVE SEED GUIDED LEFT BREAST LUMPECTOMY;  Surgeon: Fanny Skates, MD;  Location: [place];   Service: General;  Laterality: Left;   BREAST SURGERY     BUNIONECTOMY     prior to 01/2006   [place]  02/08/2006   DR. INGRAM   COLONOSCOPY     CORONARY STENT INTERVENTION N/A 01/01/2017   Procedure: CORONARY STENT INTERVENTION;  Surgeon: Jettie Booze, MD;  Location: [place] [facility];  Service: Cardiovascular;  Laterality: N/A;   DENTAL SURGERY  2009-2016   13 dental implants   LEFT HEART CATH AND CORONARY ANGIOGRAPHY N/A 01/01/2017   Procedure: LEFT HEART CATH AND CORONARY ANGIOGRAPHY;  Surgeon: Jettie Booze, MD;  Location: [place] [facility];  Service: Cardiovascular;  Laterality: N/A;   LEG SURGERY  07/06/2004   Broken tibia   NASAL SINUS SURGERY     X4; prior to 01/2006   [facility] REMOVAL Right 12/05/2015   Procedure: REMOVAL PORT-A-CATH;  Surgeon: Fanny Skates, MD;  Location: [place];  Service: General;  Laterality: Right;   PORTACATH PLACEMENT Left 09/08/2015   Procedure: INSERTION OF PORT-A-CATH ;  Surgeon: Fanny Skates, MD;  Location: [place];  Service: General;  Laterality: Left;   Patient Active Problem List   Diagnosis Date Noted   Caregiver  stress 07/26/2020   Adjustment disorder with mixed anxiety and depressed mood 12/01/2019   Lumbar radiculopathy 05/11/2019   Bilateral stenosis of lateral recess of lumbar spine 05/11/2019   Hyperlipidemia 12/23/2018   Chronic diastolic CHF (congestive heart failure) ([place]) 11/24/2018   Abnormal transaminases 03/24/2018   CAD (coronary artery disease) 02/11/2017   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   Status post coronary artery stent placement    Non-ST elevation (NSTEMI) myocardial infarction [facility])    Depression with anxiety 12/31/2016   Chest pain 12/31/2016   Hyponatremia 12/31/2016   Chronic pain 12/31/2016   Essential hypertension 12/31/2016   Unstable angina (HCC)    Family history of breast cancer in female 02/08/2016   Rash and nonspecific  skin eruption 10/03/2015   Sensorineural hearing loss (SNHL) of both ears 09/05/2015   Malignant neoplasm of upper-outer quadrant of right breast in female, estrogen receptor positive ([place]) 09/01/2015   Sclerosing adenosis of left breast 09/01/2015   Asthma 09/12/2011   Chronic vaginitis    Fibromyalgia    Interstitial cystitis    UNSPECIFIED HYPOTHYROIDISM 06/14/2009   DEPRESSIVE DISORDER NOT ELSEWHERE CLASSIFIED 06/14/2009   Hereditary and idiopathic peripheral neuropathy 06/14/2009   UNSPEC HEMORRHOIDS WITHOUT MENTION COMPLICATION 30/10/6224   ALLERGIC RHINITIS CAUSE UNSPECIFIED 06/14/2009   ESOPHAGEAL REFLUX 06/14/2009   Irritable bowel syndrome 06/14/2009   INSOMNIA UNSPECIFIED 06/14/2009   Osteoporosis 08/27/2007    PCP: Seward Carol, MD  REFERRING PROVIDER: Wilber Bihari, NP  REFERRING DIAG: Rt UE lymphedema  THERAPY DIAG:  Lymphedema, not elsewhere classified  Malignant neoplasm of upper-outer quadrant of right breast in female, estrogen receptor positive ([place])  ONSET DATE: 12/30/19  Rationale for Evaluation and Treatment Rehabilitation  SUBJECTIVE                                                                                                                                                                                           SUBJECTIVE STATEMENT: I tried to order the night time garment last night but could not remember my password.  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 She was treated with neoadjuvant chemotherapy and had a right and left lumpectomy with right axillary lymph node dissection on 12/05/15, with 15 out of 17 lymph nodes being positive. Radiation completed. She has a history of fibromyalgia and osteoporosis. They kept thinking that I had cellulitis in the arm starting 2 years ago and had been off and on antibiotics.  I have a compression sleeve.  CT and doppler negative.     PAIN:  Are you having pain? No  PRECAUTIONS: Rt lymphedema  WEIGHT BEARING RESTRICTIONS No  FALLS:  Has patient fallen in last 6 months? No  LIVING ENVIRONMENT: Lives with: lives alone with caregiver 24/7 - will be going to [place] next month  Lives in: House/apartment  OCCUPATION: Retired   LEISURE: not discussed  HAND DOMINANCE : right   PRIOR LEVEL OF FUNCTION: uses care aide 24/7  PATIENT GOALS what to do for the swelling   OBJECTIVE  PALPATION: Non pitting  OBSERVATIONS / OTHER ASSESSMENTS: Rt UE significantly larger  POSTURE: rounded shoulders    LYMPHEDEMA ASSESSMENTS:  LANDMARK RIGHT  eval 02/12/22 02/14/22 02/19/22 02/21/22 02/23/22  axilla 33 33  31 31.9 32  15cm 33.5 33.5 33 32 33 32  10 cm proximal to olecranon process 35 34 33 32 32 30.5  Olecranon process 28.5 28.5 27 27.2 27.1 26.2  15cm proximal  26 26.2 22.9 23.5 23.'5 23  10 '$ cm proximal to ulnar styloid process 22.2 23 20.3 20.7 21.4 20  Just proximal to ulnar styloid process 15 15 15.7 15.0 15 14.8  Across hand at thumb web space 18.3 18.3 17.1 17.'4 17 17  '$ At base of 2nd digit 7.4 6.2 5.9 5.9 5.9 5.5  (Blank rows = not tested) Length of 10 2035m  LANDMARK LEFT  eval  axilla 31  15cm 30  10 cm proximal to olecranon process 28.8  Olecranon process 24.5  15cm 20.8  10 cm proximal to ulnar styloid process 18.2  Just proximal to ulnar styloid process 14  Across hand at thumb web space 17  At base of 2nd digit 5.8  (Blank rows = not tested)   16759m LLIS:  10  TODAY'S TREATMENT  02/23/22:  Removed bandages and pt washed arm and hand with soap and water  Remeasured UE - it has decreased throughout since last session. Compression bandaging: tg soft inside out, elastomull to fingers 1-4 then covered with paper tape, artiflex from hand to axilla, 1/2 grey foam used at antecubital fossa for padding, 2nd set of 1 6cm hand bandage, 1 8cm in herringbone fashion and 1 10cm from wrist to  axilla given.   02/21/22:  Removed bandages and pt washed arm and hand with soap and water  Remeasured UE - discussed night time compression options - Educated pt about importance of a night time garment. Showed pt several different night time garment options and pt was agreeable to a velcro garment because it was not as thick as a tribute night or circaid profile. She fit in to a medium L&R arm wrap - avg length and a small L&R gauntlet. She was given info to obtain from compression guru.  Compression bandaging: tg soft inside out, elastomull to fingers 1-4 then covered with paper tape, artiflex from hand to axilla, 1/2 grey foam used at antecubital fossa for padding, 2nd set of 1 6cm hand bandage, 1 8cm in herringbone fashion and 1 10cm from wrist to axilla given.  02/19/22:  Removed bandages and pt washed arm and hand with soap and water  Remeasured UE - discussed compression options - compared Rt UE size to medi mondi size chart and pt would be in a size 2 unless reducing quite a bit so she would like to order this size 2 long with a matching gauntlet to have ready until she gets a custom flat knit in a color she would like.   Pt was given compression guru information.  Also discussed night garment which pt would like  to wait on at this time.   Compression bandaging: tg soft inside out, elastomull to fingers 1-4 then covered with paper tape, artiflex from hand to axilla, rosidal soft rectangle used at antecubital fossa for padding, 2nd set of 1 6cm hand bandage, 1 8cm in herringbone fashion and 1 10cm from wrist to axilla given.   02/16/22:  Removed bandages and pt washed arm and hand with soap and water prior to MLD.  MLD: In supine with 2 pillows: MLD: In supine: Short neck, 5 diaphragmatic breaths, L axillary nodes and establishment of interaxillary pathway, R inguinal nodes and establishment of axilloinguinal pathway, then R UE moving fluid towards pathways spending extra time in any areas of  fibrosis then retracing all steps. With education on principles of MLD, use of pump, and answering all questions.   Compression bandaging: tg soft inside out, elastomull to fingers 1-5 then covered with paper tape, artiflex from hand to axilla, 1 6cm hand bandage, 1 8cm in herringbone fashion and 1 10cm from wrist to axilla   02/14/22:  Education on CDT principles MLD: In supine with 2 pillows: MLD: In supine: Short neck, 5 diaphragmatic breaths, L axillary nodes and establishment of interaxillary pathway, R inguinal nodes and establishment of axilloinguinal pathway, then R UE moving fluid towards pathways spending extra time in any areas of fibrosis then retracing all steps. With education on principles of MLD, use of pump, and answering all questions.   Compression bandaging: tg soft inside out, elastomull to fingers 1-5 then covered with paper tape, artiflex from hand to axilla, 1 6cm hand bandage, 1 8cm and 1 10cm from wrist to axilla   02/12/22:  Education on CDT principles MLD: In supine with 2 pillows: MLD: In supine: Short neck, 5 diaphragmatic breaths, L axillary nodes and establishment of interaxillary pathway, R inguinal nodes and establishment of axilloinguinal pathway, then R UE moving fluid towards pathways spending extra time in any areas of fibrosis then retracing all steps. With education on principles of MLD, use of pump, and answering all questions.   Compression bandaging: eucerin lotion, tg soft inside out, fingers 1-4, artiflex from hand to axilla, 1 6cm hand bandage, 1 8cm and 1 10cm from wrist to axilla with 8cm in herringbone.  Education throughout on when to remove, bandage care, etc.   01/30/22:   Eval visit discussed POC options showing pt day and night garments, velcro, bandaging options  PATIENT EDUCATION:  Education details: per today's visit Person educated: Patient Education method: Customer service manager Education comprehension: verbalized  understanding   HOME EXERCISE PROGRAM: None  GARMENTS: ordering OTS size 2 long medi mondi and size 2 medi harmony gauntlet to have ready, would like to wait on night garment, and would like to get a custom made - Okay with self pay for all.  Pt ordering L&R ready wrap for her arm and gauntlet from compression guru.   ASSESSMENT:  CLINICAL IMPRESSION: Pt's circumferences have decreased throughout since last visit. She reports it began feeling tight and she did loosen it. She is planning to order a night time garment. She reports 1/2 grey foam helped with comfort at antecubital fossa but she still has itching with the bandages. Will continue until maximal reduction is reached.   OBJECTIVE IMPAIRMENTS decreased knowledge of condition, decreased knowledge of use of DME, and increased edema.   ACTIVITY LIMITATIONS lifting  PARTICIPATION LIMITATIONS: meal prep, cleaning, laundry, and community activity  PERSONAL FACTORS Age, Time since onset of injury/illness/exacerbation, and  1-2 comorbidities: sugical and radiation hx  are also affecting patient's functional outcome.   REHAB POTENTIAL: Good  CLINICAL DECISION MAKING: Stable/uncomplicated  EVALUATION COMPLEXITY: Low  GOALS: Goals reviewed with patient? Yes   LONG TERM GOALS: Target date: 03/14/2022    Pt will be educated on lymphedema and CDT principles Baseline:  Goal status: INITIAL  2.  Pt will decrease UE volume by at least 174m to decrease risk of infection Baseline:  Goal status: INITIAL  3.  Pt will decrease LLIS by 4 points to demonstrate improved status Baseline:  Goal status: INITIAL  4.  Pt will obtain compression for the UE Baseline:  Goal status: INITIAL    PLAN: PT FREQUENCY: 3x/week  PT DURATION: 6 weeks  PLANNED INTERVENTIONS: Therapeutic exercises, Therapeutic activity, Patient/Family education, Self Care, DME instructions, Manual therapy, and Re-evaluation  PLAN FOR NEXT SESSION:  CDT Lt UE,  start teaching MLD, progress as able until plateau. - did she receive garments?   [facility] 02/23/2022, 11:32 AM

## 2022-02-25 NOTE — Progress Notes (Signed)
[name] - 76 y.o. female MRN [id number]  Date of birth: 1945-07-30  Office Visit Note: Visit Date: 02/13/2022 PCP: Seward Carol, MD Referred by: Lorine Bears, NP  Subjective: Chief Complaint  Patient presents with   Lower Back - Pain   HPI:  [name] is a 76 y.o. female who comes in today at the request of Barnet Pall, FNP for planned Bilateral L5-S1 Lumbar Transforaminal epidural steroid injection with fluoroscopic guidance.  The patient has failed conservative care including home exercise, medications, time and activity modification.  This injection will be diagnostic and hopefully therapeutic.  Please see requesting physician notes for further details and justification.   ROS Otherwise per HPI.  Assessment & Plan: Visit Diagnoses:    ICD-10-CM   1. Lumbar radiculopathy  M54.16 XR C-ARM NO REPORT    Epidural Steroid injection    methylPREDNISolone acetate (DEPO-MEDROL) injection 80 mg      Plan: No additional findings.   Meds & Orders:  Meds ordered this encounter  Medications   methylPREDNISolone acetate (DEPO-MEDROL) injection 80 mg    Orders Placed This Encounter  Procedures   XR C-ARM NO REPORT   Epidural Steroid injection    Follow-up: Return for visit to requesting provider as needed.   Procedures: No procedures performed  Lumbosacral Transforaminal Epidural Steroid Injection - Sub-Pedicular Approach with Fluoroscopic Guidance  Patient: [name]      Date of Birth: 19-Jan-1946 MRN: [id number] PCP: Seward Carol, MD      Visit Date: 02/13/2022   Universal Protocol:    Date/Time: 02/13/2022  Consent Given By: the patient  Position: PRONE  Additional Comments: Vital signs were monitored before and after the procedure. Patient was prepped and draped in the usual sterile fashion. The correct patient, procedure, and site was verified.   Injection Procedure Details:   Procedure diagnoses: Lumbar radiculopathy [M54.16]     Meds Administered:  Meds ordered this encounter  Medications   methylPREDNISolone acetate (DEPO-MEDROL) injection 80 mg    Laterality: Bilateral  Location/Site: L5  Needle:5.0 in., 22 ga.  Short bevel or Quincke spinal needle  Needle Placement: Transforaminal  Findings:    -Comments: Excellent flow of contrast along the nerve, nerve root and into the epidural space.  Procedure Details: After squaring off the end-plates to get a true AP view, the C-arm was positioned so that an oblique view of the foramen as noted above was visualized. The target area is just inferior to the "nose of the scotty dog" or sub pedicular. The soft tissues overlying this structure were infiltrated with 2-3 ml. of 1% Lidocaine without Epinephrine.  The spinal needle was inserted toward the target using a "trajectory" view along the fluoroscope beam.  Under AP and lateral visualization, the needle was advanced so it did not puncture dura and was located close the 6 O'Clock position of the pedical in AP tracterory. Biplanar projections were used to confirm position. Aspiration was confirmed to be negative for CSF and/or blood. A 1-2 ml. volume of Isovue-250 was injected and flow of contrast was noted at each level. Radiographs were obtained for documentation purposes.   After attaining the desired flow of contrast documented above, a 0.5 to 1.0 ml test dose of 0.25% Marcaine was injected into each respective transforaminal space.  The patient was observed for 90 seconds post injection.  After no sensory deficits were reported, and normal lower extremity motor function was noted,   the above injectate was administered  so that equal amounts of the injectate were placed at each foramen (level) into the transforaminal epidural space.   Additional Comments:  The patient tolerated the procedure well Dressing: 2 x 2 sterile gauze and Band-Aid    Post-procedure details: Patient was observed during the  procedure. Post-procedure instructions were reviewed.  Patient left the clinic in stable condition.    Clinical History: MRI LUMBAR SPINE WITHOUT CONTRAST     TECHNIQUE:  Multiplanar, multisequence MR imaging of the lumbar spine was  performed. No intravenous contrast was administered.     COMPARISON:  01/29/2014     FINDINGS:  Segmentation:  5 lumbar type vertebrae     Alignment: Levoscoliosis. Grade 1 anterolisthesis at L4-5. Mild  retrolisthesis at L1-2 and L2-3. Misalignment has progressed from  2015.     Vertebrae:  No fracture, evidence of discitis, or bone lesion.     Conus medullaris and cauda equina: Conus extends to the L1-2 level.  Conus and cauda equina appear normal. 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     Disc levels:     T12- L1: Disc narrowing and ventral spurring.  No impingement     L1-L2: Rightward disc collapse with far-lateral bulging and ridging.  Asymmetric right facet spurring. Moderate right foraminal narrowing.  Noncompressive right subarticular recess stenosis.     L2-L3: Disc narrowing and bulging with retrolisthesis. Mild facet  spurring. Mild to moderate left foraminal narrowing.     L3-L4: Disc narrowing and left eccentric bulging. Degenerative facet  spurring asymmetric to the left. Mild left subarticular recess  stenosis.     L4-L5: Facet osteoarthritis with spurring and anterolisthesis.  Moderate disc space narrowing with mild bulging. Bilateral root  sleeve cysts. Mild to moderate spinal stenosis with asymmetric left  subarticular recess narrowing     L5-S1:Disc narrowing and minor bulging. Mild facet spurring. No  neural compression.     IMPRESSION:  1. 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  2. Mild to moderate foraminal narrowing on the right at L1-2 and  left at L2-3.  3. Mild to  moderate spinal stenosis at L4-5 with asymmetric left  subarticular recess narrowing.        Electronically Signed    By: Monte Fantasia M.D.    On: 05/08/2020 07:15     Objective:  VS:  HT:    WT:   BMI:     BP:(!) 159/80  HR:65bpm  TEMP: ( )  RESP:  Physical Exam Vitals and nursing note reviewed.  Constitutional:      General: She is not in acute distress.    Appearance: Normal appearance. She is not ill-appearing.  HENT:     Head: Normocephalic and atraumatic.     Right Ear: External ear normal.     Left Ear: External ear normal.  Eyes:     Extraocular Movements: Extraocular movements intact.  Cardiovascular:     Rate and Rhythm: Normal rate.     Pulses: Normal pulses.  Pulmonary:     Effort: Pulmonary effort is normal. No respiratory distress.  Abdominal:     General: There is no distension.     Palpations: Abdomen is soft.  Musculoskeletal:        General: Tenderness present.     Cervical back: Neck supple.     Right lower leg: No  edema.     Left lower leg: No edema.     Comments: Patient has good distal strength with no pain over the greater trochanters.  No clonus or focal weakness.  Skin:    Findings: No erythema, lesion or rash.  Neurological:     General: No focal deficit present.     Mental Status: She is alert and oriented to person, place, and time.     Sensory: No sensory deficit.     Motor: No weakness or abnormal muscle tone.     Coordination: Coordination normal.  Psychiatric:        Mood and Affect: Mood normal.        Behavior: Behavior normal.      Imaging: No results found.

## 2022-02-25 NOTE — Procedures (Signed)
Lumbosacral Transforaminal Epidural Steroid Injection - Sub-Pedicular Approach with Fluoroscopic Guidance  Patient: [name]      Date of Birth: 1946-01-29 MRN: [id number] PCP: Seward Carol, MD      Visit Date: 02/13/2022   Universal Protocol:    Date/Time: 02/13/2022  Consent Given By: the patient  Position: PRONE  Additional Comments: Vital signs were monitored before and after the procedure. Patient was prepped and draped in the usual sterile fashion. The correct patient, procedure, and site was verified.   Injection Procedure Details:   Procedure diagnoses: Lumbar radiculopathy [M54.16]    Meds Administered:  Meds ordered this encounter  Medications   methylPREDNISolone acetate (DEPO-MEDROL) injection 80 mg    Laterality: Bilateral  Location/Site: L5  Needle:5.0 in., 22 ga.  Short bevel or Quincke spinal needle  Needle Placement: Transforaminal  Findings:    -Comments: Excellent flow of contrast along the nerve, nerve root and into the epidural space.  Procedure Details: After squaring off the end-plates to get a true AP view, the C-arm was positioned so that an oblique view of the foramen as noted above was visualized. The target area is just inferior to the "nose of the scotty dog" or sub pedicular. The soft tissues overlying this structure were infiltrated with 2-3 ml. of 1% Lidocaine without Epinephrine.  The spinal needle was inserted toward the target using a "trajectory" view along the fluoroscope beam.  Under AP and lateral visualization, the needle was advanced so it did not puncture dura and was located close the 6 O'Clock position of the pedical in AP tracterory. Biplanar projections were used to confirm position. Aspiration was confirmed to be negative for CSF and/or blood. A 1-2 ml. volume of Isovue-250 was injected and flow of contrast was noted at each level. Radiographs were obtained for documentation purposes.   After attaining the desired  flow of contrast documented above, a 0.5 to 1.0 ml test dose of 0.25% Marcaine was injected into each respective transforaminal space.  The patient was observed for 90 seconds post injection.  After no sensory deficits were reported, and normal lower extremity motor function was noted,   the above injectate was administered so that equal amounts of the injectate were placed at each foramen (level) into the transforaminal epidural space.   Additional Comments:  The patient tolerated the procedure well Dressing: 2 x 2 sterile gauze and Band-Aid    Post-procedure details: Patient was observed during the procedure. Post-procedure instructions were reviewed.  Patient left the clinic in stable condition.

## 2022-02-26 ENCOUNTER — Encounter: Payer: Self-pay | Admitting: Rehabilitation

## 2022-02-26 ENCOUNTER — Ambulatory Visit: Payer: Medicare Other | Admitting: Rehabilitation

## 2022-02-26 DIAGNOSIS — I890 Lymphedema, not elsewhere classified: Secondary | ICD-10-CM

## 2022-02-26 DIAGNOSIS — Z170 Estrogen receptor positive status [ER+]: Secondary | ICD-10-CM

## 2022-02-26 NOTE — Patient Instructions (Signed)
Deep Effective Breath   1.) Standing, sitting, or laying down, place both hands on the belly. Take a deep breath IN, expanding the belly; then breath OUT, contracting the belly.   Axilla to Axilla - Sweep   2.) On both sides make 5 circles in the armpits  3.) then pump _5__ times from involved armpit across chest to uninvolved armpit, making a pathway.   Axilla to Inguinal Nodes - Sweep   4.) On involved side, make 5 circles at groin at panty line,   5.) then pump _5__ times from armpit along side of trunk to outer hip, making your other pathway.   Arm Posterior: Elbow to Shoulder - Sweep   6.) Pump _5__ times from back of elbow to top of shoulder.   7.)Then inner to outer upper arm _5_ times, then outer arm again _5_ times.   8.) Then back to the pathways _2-3_ times.     9.) Pump or stationary circles _5__ times from wrist to elbow making sure to do both sides of the forearm.     10.) Pump or stationary circles _5__ times on back of hand including knuckle spaces and individual fingers if needed working up towards the wrist  11.) then retrace all your steps working back up the forearm, doing both sides; upper outer arm and back to your pathways _2-3_ times each. Then do 5 circles again at uninvolved armpit and involved groin where you started! Good job!!   Do __1_ time per day.  Copyright  VHI. All rights reserved.

## 2022-02-26 NOTE — Therapy (Signed)
OUTPATIENT PHYSICAL THERAPY ONCOLOGYTREATMENT  Patient Name: [name] MRN: [id number] DOB:[birth year], 76 y.o., female Today's Date: 02/26/2022   PT End of Session - 02/26/22 1200     Visit Number 8    Number of Visits 19    Date for PT Re-Evaluation 03/14/22    PT Start Time 1202    PT Stop Time 1315    PT Time Calculation (min) 73 min    Activity Tolerance Patient tolerated treatment well    Behavior During Therapy [facility] for tasks assessed/performed               Past Medical History:  Diagnosis Date   Anxiety    Arthritis 2008   Asthma 1953   age 65   Breast cancer ([place]) dx'd 2017   right   Cancer ([place])    breast cancer   Chronic vaginitis 1998   Fibromyalgia 1982   GERD (gastroesophageal reflux disease)    since at least 2011   Hemorrhoids 2009   History of radiation therapy 01/18/16-03/05/16   right breast/nodal areas 45 Gy, right breast boost 16 Gy   Hypoglycemia 2003   Hypothyroidism 1964   age 51   Interstitial cystitis 1998   Irritable bowel syndrome 06/14/2009   Qualifier: Diagnosis of  By: Patsy Baltimore RN, Denise     Menopausal symptoms    Osteoporosis 12/24/2006   Personal history of chemotherapy    Personal history of radiation therapy    Pneumonia 1979   varicella   PONV (postoperative nausea and vomiting) 1979   Past Surgical History:  Procedure Laterality Date   BLADDER HYPEREXTENSION     X4, prior to 01/2006   [place] LUMPECTOMY Right    2017   BREAST LUMPECTOMY WITH NEEDLE LOCALIZATION AND AXILLARY LYMPH NODE DISSECTION Right 12/05/2015   Procedure: RIGHT BREAST NEEDLE LOCALIZED (WIRES X 2) LUMPECTOMY WITH AXILLARY LYMPH NODE DISSECTION;  Surgeon: Fanny Skates, MD;  Location: [place];  Service: General;  Laterality: Right;   BREAST LUMPECTOMY WITH RADIOACTIVE SEED LOCALIZATION Left 12/05/2015   Procedure: RADIOACTIVE SEED GUIDED LEFT BREAST LUMPECTOMY;  Surgeon: Fanny Skates, MD;  Location: [place];   Service: General;  Laterality: Left;   BREAST SURGERY     BUNIONECTOMY     prior to 01/2006   [place]  02/08/2006   DR. INGRAM   COLONOSCOPY     CORONARY STENT INTERVENTION N/A 01/01/2017   Procedure: CORONARY STENT INTERVENTION;  Surgeon: Jettie Booze, MD;  Location: [place] CV [facility];  Service: Cardiovascular;  Laterality: N/A;   DENTAL SURGERY  2009-2016   13 dental implants   LEFT HEART CATH AND CORONARY ANGIOGRAPHY N/A 01/01/2017   Procedure: LEFT HEART CATH AND CORONARY ANGIOGRAPHY;  Surgeon: Jettie Booze, MD;  Location: [place] CV [facility];  Service: Cardiovascular;  Laterality: N/A;   LEG SURGERY  07/06/2004   Broken tibia   NASAL SINUS SURGERY     X4; prior to 01/2006   [facility] REMOVAL Right 12/05/2015   Procedure: REMOVAL PORT-A-CATH;  Surgeon: Fanny Skates, MD;  Location: [place];  Service: General;  Laterality: Right;   PORTACATH PLACEMENT Left 09/08/2015   Procedure: INSERTION OF PORT-A-CATH ;  Surgeon: Fanny Skates, MD;  Location: [place];  Service: General;  Laterality: Left;   Patient Active Problem List   Diagnosis Date Noted   Caregiver  stress 07/26/2020   Adjustment disorder with mixed anxiety and depressed mood 12/01/2019   Lumbar radiculopathy 05/11/2019   Bilateral stenosis of lateral recess of lumbar spine 05/11/2019   Hyperlipidemia 12/23/2018   Chronic diastolic CHF (congestive heart failure) ([place]) 11/24/2018   Abnormal transaminases 03/24/2018   CAD (coronary artery disease) 02/11/2017   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   Status post coronary artery stent placement    Non-ST elevation (NSTEMI) myocardial infarction [facility])    Depression with anxiety 12/31/2016   Chest pain 12/31/2016   Hyponatremia 12/31/2016   Chronic pain 12/31/2016   Essential hypertension 12/31/2016   Unstable angina (HCC)    Family history of breast cancer in female 02/08/2016   Rash and nonspecific  skin eruption 10/03/2015   Sensorineural hearing loss (SNHL) of both ears 09/05/2015   Malignant neoplasm of upper-outer quadrant of right breast in female, estrogen receptor positive ([place]) 09/01/2015   Sclerosing adenosis of left breast 09/01/2015   Asthma 09/12/2011   Chronic vaginitis    Fibromyalgia    Interstitial cystitis    UNSPECIFIED HYPOTHYROIDISM 06/14/2009   DEPRESSIVE DISORDER NOT ELSEWHERE CLASSIFIED 06/14/2009   Hereditary and idiopathic peripheral neuropathy 06/14/2009   UNSPEC HEMORRHOIDS WITHOUT MENTION COMPLICATION 26/94/8546   ALLERGIC RHINITIS CAUSE UNSPECIFIED 06/14/2009   ESOPHAGEAL REFLUX 06/14/2009   Irritable bowel syndrome 06/14/2009   INSOMNIA UNSPECIFIED 06/14/2009   Osteoporosis 08/27/2007    PCP: Seward Carol, MD  REFERRING PROVIDER: Wilber Bihari, NP  REFERRING DIAG: Rt UE lymphedema  THERAPY DIAG:  Lymphedema, not elsewhere classified  Malignant neoplasm of upper-outer quadrant of right breast in female, estrogen receptor positive ([place])  ONSET DATE: 12/30/19  Rationale for Evaluation and Treatment Rehabilitation  SUBJECTIVE                                                                                                                                                                                           SUBJECTIVE STATEMENT: I got my day garments.   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 She was treated with neoadjuvant chemotherapy and had a right and left lumpectomy with right axillary lymph node dissection on 12/05/15, with 15 out of 17 lymph nodes being positive. Radiation completed. She has a history of fibromyalgia and osteoporosis. They kept thinking that I had cellulitis in the arm starting 2 years ago and had been off and on antibiotics.  I have a compression sleeve.  CT and doppler negative.    PAIN:  Are you having pain? No  PRECAUTIONS: Rt  lymphedema  WEIGHT BEARING RESTRICTIONS No  FALLS:  Has patient fallen in last 6 months? No  LIVING ENVIRONMENT: Lives with: lives alone with caregiver 24/7 - will be going to [place] next month  Lives in: House/apartment  OCCUPATION: Retired   LEISURE: not discussed  HAND DOMINANCE : right   PRIOR LEVEL OF FUNCTION: uses care aide 24/7  PATIENT GOALS what to do for the swelling   OBJECTIVE  PALPATION: Non pitting  OBSERVATIONS / OTHER ASSESSMENTS: Rt UE significantly larger  POSTURE: rounded shoulders    LYMPHEDEMA ASSESSMENTS:  LANDMARK RIGHT  eval 02/12/22 02/14/22 02/19/22 02/21/22 02/23/22 02/26/22  axilla 33 33  31 31.9 32   15cm 33.5 33.5 33 32 33 32 32  10 cm proximal to olecranon process 35 34 33 32 32 30.5 31.5  Olecranon process 28.5 28.5 27 27.2 27.1 26.2 27.5  15cm proximal  26 26.2 22.9 23.5 23.'5 23   10 '$ cm proximal to ulnar styloid process 22.2 23 20.3 20.7 21.4 20 20.3  Just proximal to ulnar styloid process 15 15 15.7 15.0 15 14.8 14.7  Across hand at thumb web space 18.3 18.3 17.1 17.'4 17 17 '$ 17.5  At base of 2nd digit 7.4 6.2 5.9 5.9 5.9 5.5 5.5  (Blank rows = not tested) Length of 10 2060m  LANDMARK LEFT  eval  axilla 31  15cm 30  10 cm proximal to olecranon process 28.8  Olecranon process 24.5  15cm 20.8  10 cm proximal to ulnar styloid process 18.2  Just proximal to ulnar styloid process 14  Across hand at thumb web space 17  At base of 2nd digit 5.8  (Blank rows = not tested)   16710m LLIS:  10  TODAY'S TREATMENT  02/26/22:  Removed bandages and pt washed arm and hand with soap and water  Remeasured UE Applied new day sleeve which fits well.  Pt practiced putting on sleeve x 4 (without donning aid x 2, and with arm butler and glove x 2).  Pt was given donning instruction and how to order arm butler and donning gloves.  Gauntlet was also donned with education on washing, care, and use of both garments.   Started education on  self MLD for the UE with brief education session with PT performing all steps with vcs, tcs, and demonstration.  Pt performed in seated and handout given.   Initially decided on pt wearing sleeve home, but after discussion we decided to bandage until night garments have arrived.   Compression bandaging: tg soft inside out, elastomull to fingers 1-4 then covered with paper tape, artiflex from hand to axilla, 1/2 grey foam used at antecubital fossa for padding, 2nd set of 1 6cm hand bandage, 1 8cm in herringbone fashion and 1 10cm from wrist to axilla given.   02/23/22:  Removed bandages and pt washed arm and hand with soap and water  Remeasured UE - it has decreased throughout since last session. Compression bandaging: tg soft inside out, elastomull to fingers 1-4 then covered with paper tape, artiflex from hand to axilla, 1/2 grey foam used at antecubital fossa for padding, 2nd set of 1 6cm hand bandage, 1 8cm in herringbone fashion and 1 10cm from wrist to axilla given.   02/21/22:  Removed bandages and pt washed arm and hand with soap and water  Remeasured UE - discussed night time compression options - Educated pt about importance of a night time garment. Showed pt several different night time garment options and pt was agreeable to a velcro  garment because it was not as thick as a tribute night or circaid profile. She fit in to a medium L&R arm wrap - avg length and a small L&R gauntlet. She was given info to obtain from compression guru.  Compression bandaging: tg soft inside out, elastomull to fingers 1-4 then covered with paper tape, artiflex from hand to axilla, 1/2 grey foam used at antecubital fossa for padding, 2nd set of 1 6cm hand bandage, 1 8cm in herringbone fashion and 1 10cm from wrist to axilla given.  02/19/22:  Removed bandages and pt washed arm and hand with soap and water  Remeasured UE - discussed compression options - compared Rt UE size to medi mondi size chart and pt would be  in a size 2 unless reducing quite a bit so she would like to order this size 2 long with a matching gauntlet to have ready until she gets a custom flat knit in a color she would like.   Pt was given compression guru information.  Also discussed night garment which pt would like to wait on at this time.   Compression bandaging: tg soft inside out, elastomull to fingers 1-4 then covered with paper tape, artiflex from hand to axilla, rosidal soft rectangle used at antecubital fossa for padding, 2nd set of 1 6cm hand bandage, 1 8cm in herringbone fashion and 1 10cm from wrist to axilla given.   02/16/22:  Removed bandages and pt washed arm and hand with soap and water prior to MLD.  MLD: In supine with 2 pillows: MLD: In supine: Short neck, 5 diaphragmatic breaths, L axillary nodes and establishment of interaxillary pathway, R inguinal nodes and establishment of axilloinguinal pathway, then R UE moving fluid towards pathways spending extra time in any areas of fibrosis then retracing all steps. With education on principles of MLD, use of pump, and answering all questions.   Compression bandaging: tg soft inside out, elastomull to fingers 1-5 then covered with paper tape, artiflex from hand to axilla, 1 6cm hand bandage, 1 8cm in herringbone fashion and 1 10cm from wrist to axilla   02/14/22:  Education on CDT principles MLD: In supine with 2 pillows: MLD: In supine: Short neck, 5 diaphragmatic breaths, L axillary nodes and establishment of interaxillary pathway, R inguinal nodes and establishment of axilloinguinal pathway, then R UE moving fluid towards pathways spending extra time in any areas of fibrosis then retracing all steps. With education on principles of MLD, use of pump, and answering all questions.   Compression bandaging: tg soft inside out, elastomull to fingers 1-5 then covered with paper tape, artiflex from hand to axilla, 1 6cm hand bandage, 1 8cm and 1 10cm from wrist to axilla   02/12/22:   Education on CDT principles MLD: In supine with 2 pillows: MLD: In supine: Short neck, 5 diaphragmatic breaths, L axillary nodes and establishment of interaxillary pathway, R inguinal nodes and establishment of axilloinguinal pathway, then R UE moving fluid towards pathways spending extra time in any areas of fibrosis then retracing all steps. With education on principles of MLD, use of pump, and answering all questions.   Compression bandaging: eucerin lotion, tg soft inside out, fingers 1-4, artiflex from hand to axilla, 1 6cm hand bandage, 1 8cm and 1 10cm from wrist to axilla with 8cm in herringbone.  Education throughout on when to remove, bandage care, etc.   01/30/22:   Eval visit discussed POC options showing pt day and night garments, velcro, bandaging options  PATIENT EDUCATION:  Education details: per today's visit Person educated: Patient Education method: Customer service manager Education comprehension: verbalized understanding   HOME EXERCISE PROGRAM: None  GARMENTS: ordering OTS size 2 long medi mondi and size 2 medi harmony gauntlet to have ready, would like to wait on night garment, and would like to get a custom made - Okay with self pay for all.  Pt ordering L&R ready wrap for her arm and gauntlet from compression guru.   ASSESSMENT:  CLINICAL IMPRESSION: Pt's circumferences have remained constant since last visit. Pt now has well fitting day garment but is still waiting for a velcro night garment.    OBJECTIVE IMPAIRMENTS decreased knowledge of condition, decreased knowledge of use of DME, and increased edema.   ACTIVITY LIMITATIONS lifting  PARTICIPATION LIMITATIONS: meal prep, cleaning, laundry, and community activity  PERSONAL FACTORS Age, Time since onset of injury/illness/exacerbation, and 1-2 comorbidities: sugical and radiation hx  are also affecting patient's functional outcome.   REHAB POTENTIAL: Good  CLINICAL DECISION MAKING:  Stable/uncomplicated  EVALUATION COMPLEXITY: Low  GOALS: Goals reviewed with patient? Yes   LONG TERM GOALS: Target date: 03/14/2022    Pt will be educated on lymphedema and CDT principles Baseline:  Goal status: INITIAL  2.  Pt will decrease UE volume by at least 142m to decrease risk of infection Baseline:  Goal status: INITIAL  3.  Pt will decrease LLIS by 4 points to demonstrate improved status Baseline:  Goal status: INITIAL  4.  Pt will obtain compression for the UE Baseline:  Goal status: INITIAL    PLAN: PT FREQUENCY: 3x/week  PT DURATION: 6 weeks  PLANNED INTERVENTIONS: Therapeutic exercises, Therapeutic activity, Patient/Family education, Self Care, DME instructions, Manual therapy, and Re-evaluation  PLAN FOR NEXT SESSION:  CDT Lt UE, start teaching MLD, progress as able until plateau. - did she receive garments?   TStark Bray PT 02/26/2022, 8:46 PM

## 2022-02-28 ENCOUNTER — Ambulatory Visit: Payer: Medicare Other | Attending: Adult Health | Admitting: Rehabilitation

## 2022-02-28 ENCOUNTER — Encounter: Payer: Self-pay | Admitting: Rehabilitation

## 2022-02-28 DIAGNOSIS — C50411 Malignant neoplasm of upper-outer quadrant of right female breast: Secondary | ICD-10-CM | POA: Insufficient documentation

## 2022-02-28 DIAGNOSIS — Z170 Estrogen receptor positive status [ER+]: Secondary | ICD-10-CM | POA: Diagnosis present

## 2022-02-28 DIAGNOSIS — I890 Lymphedema, not elsewhere classified: Secondary | ICD-10-CM | POA: Diagnosis present

## 2022-02-28 NOTE — Therapy (Signed)
OUTPATIENT PHYSICAL THERAPY ONCOLOGYTREATMENT  Patient Name: [name] MRN: [id number] DOB:[birth year], 76 y.o., female Today's Date: 02/28/2022   PT End of Session - 02/28/22 1623     Visit Number 9    Number of Visits 19    Date for PT Re-Evaluation 03/14/22    PT Start Time 1200    PT Stop Time 1254    PT Time Calculation (min) 54 min    Activity Tolerance Patient tolerated treatment well    Behavior During Therapy [facility] for tasks assessed/performed                Past Medical History:  Diagnosis Date   Anxiety    Arthritis 2008   Asthma 1953   age 73   Breast cancer ([place]) dx'd 2017   right   Cancer ([place])    breast cancer   Chronic vaginitis 1998   Fibromyalgia 1982   GERD (gastroesophageal reflux disease)    since at least 2011   Hemorrhoids 2009   History of radiation therapy 01/18/16-03/05/16   right breast/nodal areas 45 Gy, right breast boost 16 Gy   Hypoglycemia 2003   Hypothyroidism 1964   age 32   Interstitial cystitis 1998   Irritable bowel syndrome 06/14/2009   Qualifier: Diagnosis of  By: Patsy Baltimore RN, Denise     Menopausal symptoms    Osteoporosis 12/24/2006   Personal history of chemotherapy    Personal history of radiation therapy    Pneumonia 1979   varicella   PONV (postoperative nausea and vomiting) 1979   Past Surgical History:  Procedure Laterality Date   BLADDER HYPEREXTENSION     X4, prior to 01/2006   [place] LUMPECTOMY Right    2017   BREAST LUMPECTOMY WITH NEEDLE LOCALIZATION AND AXILLARY LYMPH NODE DISSECTION Right 12/05/2015   Procedure: RIGHT BREAST NEEDLE LOCALIZED (WIRES X 2) LUMPECTOMY WITH AXILLARY LYMPH NODE DISSECTION;  Surgeon: Fanny Skates, MD;  Location: [place];  Service: General;  Laterality: Right;   BREAST LUMPECTOMY WITH RADIOACTIVE SEED LOCALIZATION Left 12/05/2015   Procedure: RADIOACTIVE SEED GUIDED LEFT BREAST LUMPECTOMY;  Surgeon: Fanny Skates, MD;  Location: [place];   Service: General;  Laterality: Left;   BREAST SURGERY     BUNIONECTOMY     prior to 01/2006   [place]  02/08/2006   DR. INGRAM   COLONOSCOPY     CORONARY STENT INTERVENTION N/A 01/01/2017   Procedure: CORONARY STENT INTERVENTION;  Surgeon: Jettie Booze, MD;  Location: [place] [facility];  Service: Cardiovascular;  Laterality: N/A;   DENTAL SURGERY  2009-2016   13 dental implants   LEFT HEART CATH AND CORONARY ANGIOGRAPHY N/A 01/01/2017   Procedure: LEFT HEART CATH AND CORONARY ANGIOGRAPHY;  Surgeon: Jettie Booze, MD;  Location: [place] [facility];  Service: Cardiovascular;  Laterality: N/A;   LEG SURGERY  07/06/2004   Broken tibia   NASAL SINUS SURGERY     X4; prior to 01/2006   [facility] REMOVAL Right 12/05/2015   Procedure: REMOVAL PORT-A-CATH;  Surgeon: Fanny Skates, MD;  Location: [place];  Service: General;  Laterality: Right;   PORTACATH PLACEMENT Left 09/08/2015   Procedure: INSERTION OF PORT-A-CATH ;  Surgeon: Fanny Skates, MD;  Location: [place];  Service: General;  Laterality: Left;   Patient Active Problem List   Diagnosis Date Noted  Caregiver stress 07/26/2020   Adjustment disorder with mixed anxiety and depressed mood 12/01/2019   Lumbar radiculopathy 05/11/2019   Bilateral stenosis of lateral recess of lumbar spine 05/11/2019   Hyperlipidemia 12/23/2018   Chronic diastolic CHF (congestive heart failure) ([place]) 11/24/2018   Abnormal transaminases 03/24/2018   CAD (coronary artery disease) 02/11/2017   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   Status post coronary artery stent placement    Non-ST elevation (NSTEMI) myocardial infarction [facility])    Depression with anxiety 12/31/2016   Chest pain 12/31/2016   Hyponatremia 12/31/2016   Chronic pain 12/31/2016   Essential hypertension 12/31/2016   Unstable angina (HCC)    Family history of breast cancer in female 02/08/2016   Rash and nonspecific  skin eruption 10/03/2015   Sensorineural hearing loss (SNHL) of both ears 09/05/2015   Malignant neoplasm of upper-outer quadrant of right breast in female, estrogen receptor positive ([place]) 09/01/2015   Sclerosing adenosis of left breast 09/01/2015   Asthma 09/12/2011   Chronic vaginitis    Fibromyalgia    Interstitial cystitis    UNSPECIFIED HYPOTHYROIDISM 06/14/2009   DEPRESSIVE DISORDER NOT ELSEWHERE CLASSIFIED 06/14/2009   Hereditary and idiopathic peripheral neuropathy 06/14/2009   UNSPEC HEMORRHOIDS WITHOUT MENTION COMPLICATION 03/55/9741   ALLERGIC RHINITIS CAUSE UNSPECIFIED 06/14/2009   ESOPHAGEAL REFLUX 06/14/2009   Irritable bowel syndrome 06/14/2009   INSOMNIA UNSPECIFIED 06/14/2009   Osteoporosis 08/27/2007    PCP: Seward Carol, MD  REFERRING PROVIDER: Wilber Bihari, NP  REFERRING DIAG: Rt UE lymphedema  THERAPY DIAG:  Lymphedema, not elsewhere classified  Malignant neoplasm of upper-outer quadrant of right breast in female, estrogen receptor positive ([place])  ONSET DATE: 12/30/19  Rationale for Evaluation and Treatment Rehabilitation  SUBJECTIVE                                                                                                                                                                                           SUBJECTIVE STATEMENT: I am ready to not wrap. 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 She was treated with neoadjuvant chemotherapy and had a right and left lumpectomy with right axillary lymph node dissection on 12/05/15, with 15 out of 17 lymph nodes being positive. Radiation completed. She has a history of fibromyalgia and osteoporosis. They kept thinking that I had cellulitis in the arm starting 2 years ago and had been off and on antibiotics.  I have a compression sleeve.  CT and doppler negative.    PAIN:  Are you having  pain? No  PRECAUTIONS: Rt lymphedema  WEIGHT BEARING RESTRICTIONS No  FALLS:  Has patient fallen in last 6 months? No  LIVING ENVIRONMENT: Lives with: lives alone with caregiver 24/7 - will be going to [place] next month  Lives in: House/apartment  OCCUPATION: Retired   LEISURE: not discussed  HAND DOMINANCE : right   PRIOR LEVEL OF FUNCTION: uses care aide 24/7  PATIENT GOALS what to do for the swelling   OBJECTIVE  PALPATION: Non pitting  OBSERVATIONS / OTHER ASSESSMENTS: Rt UE significantly larger  POSTURE: rounded shoulders    LYMPHEDEMA ASSESSMENTS:  LANDMARK RIGHT  eval 02/12/22 02/14/22 02/19/22 02/21/22 02/23/22 02/26/22  axilla 33 33  31 31.9 32   15cm 33.5 33.5 33 32 33 32 32  10 cm proximal to olecranon process 35 34 33 32 32 30.5 31.5  Olecranon process 28.5 28.5 27 27.2 27.1 26.2 27.5  15cm proximal  26 26.2 22.9 23.5 23.'5 23   10 '$ cm proximal to ulnar styloid process 22.2 23 20.3 20.7 21.4 20 20.3  Just proximal to ulnar styloid process 15 15 15.7 15.0 15 14.8 14.7  Across hand at thumb web space 18.3 18.3 17.1 17.'4 17 17 '$ 17.5  At base of 2nd digit 7.4 6.2 5.9 5.9 5.9 5.5 5.5  (Blank rows = not tested) Length of 10 201m  LANDMARK LEFT  eval  axilla 31  15cm 30  10 cm proximal to olecranon process 28.8  Olecranon process 24.5  15cm 20.8  10 cm proximal to ulnar styloid process 18.2  Just proximal to ulnar styloid process 14  Across hand at thumb web space 17  At base of 2nd digit 5.8  (Blank rows = not tested)   1674m LLIS:  10  TODAY'S TREATMENT  02/27/22:  Removed bandages and pt washed arm and hand with soap and water  Reviewed self MLD for the UE with PT performing all steps with vcs, tcs, and demonstration.  Pt performed in seated with cueing as needed. Pt donned day sleeve and we reviewed how to use velcro garment for when it comes.   02/26/22:  Removed bandages and pt washed arm and hand with soap and water  Remeasured  UE Applied new day sleeve which fits well.  Pt practiced putting on sleeve x 4 (without donning aid x 2, and with arm butler and glove x 2).  Pt was given donning instruction and how to order arm butler and donning gloves.  Gauntlet was also donned with education on washing, care, and use of both garments.   Started education on self MLD for the UE with brief education session with PT performing all steps with vcs, tcs, and demonstration.  Pt performed in seated and handout given.   Initially decided on pt wearing sleeve home, but after discussion we decided to bandage until night garments have arrived.   Compression bandaging: tg soft inside out, elastomull to fingers 1-4 then covered with paper tape, artiflex from hand to axilla, 1/2 grey foam used at antecubital fossa for padding, 2nd set of 1 6cm hand bandage, 1 8cm in herringbone fashion and 1 10cm from wrist to axilla given.   02/23/22:  Removed bandages and pt washed arm and hand with soap and water  Remeasured UE - it has decreased throughout since last session. Compression bandaging: tg soft inside out, elastomull to fingers 1-4 then covered with paper tape, artiflex from hand to axilla, 1/2 grey foam used at antecubital fossa for padding, 2nd set of  1 6cm hand bandage, 1 8cm in herringbone fashion and 1 10cm from wrist to axilla given.   02/21/22:  Removed bandages and pt washed arm and hand with soap and water  Remeasured UE - discussed night time compression options - Educated pt about importance of a night time garment. Showed pt several different night time garment options and pt was agreeable to a velcro garment because it was not as thick as a tribute night or circaid profile. She fit in to a medium L&R arm wrap - avg length and a small L&R gauntlet. She was given info to obtain from compression guru.  Compression bandaging: tg soft inside out, elastomull to fingers 1-4 then covered with paper tape, artiflex from hand to axilla, 1/2 grey  foam used at antecubital fossa for padding, 2nd set of 1 6cm hand bandage, 1 8cm in herringbone fashion and 1 10cm from wrist to axilla given.  02/19/22:  Removed bandages and pt washed arm and hand with soap and water  Remeasured UE - discussed compression options - compared Rt UE size to medi mondi size chart and pt would be in a size 2 unless reducing quite a bit so she would like to order this size 2 long with a matching gauntlet to have ready until she gets a custom flat knit in a color she would like.   Pt was given compression guru information.  Also discussed night garment which pt would like to wait on at this time.   Compression bandaging: tg soft inside out, elastomull to fingers 1-4 then covered with paper tape, artiflex from hand to axilla, rosidal soft rectangle used at antecubital fossa for padding, 2nd set of 1 6cm hand bandage, 1 8cm in herringbone fashion and 1 10cm from wrist to axilla given.   02/16/22:  Removed bandages and pt washed arm and hand with soap and water prior to MLD.  MLD: In supine with 2 pillows: MLD: In supine: Short neck, 5 diaphragmatic breaths, L axillary nodes and establishment of interaxillary pathway, R inguinal nodes and establishment of axilloinguinal pathway, then R UE moving fluid towards pathways spending extra time in any areas of fibrosis then retracing all steps. With education on principles of MLD, use of pump, and answering all questions.   Compression bandaging: tg soft inside out, elastomull to fingers 1-5 then covered with paper tape, artiflex from hand to axilla, 1 6cm hand bandage, 1 8cm in herringbone fashion and 1 10cm from wrist to axilla   02/14/22:  Education on CDT principles MLD: In supine with 2 pillows: MLD: In supine: Short neck, 5 diaphragmatic breaths, L axillary nodes and establishment of interaxillary pathway, R inguinal nodes and establishment of axilloinguinal pathway, then R UE moving fluid towards pathways spending extra time in  any areas of fibrosis then retracing all steps. With education on principles of MLD, use of pump, and answering all questions.   Compression bandaging: tg soft inside out, elastomull to fingers 1-5 then covered with paper tape, artiflex from hand to axilla, 1 6cm hand bandage, 1 8cm and 1 10cm from wrist to axilla   02/12/22:  Education on CDT principles MLD: In supine with 2 pillows: MLD: In supine: Short neck, 5 diaphragmatic breaths, L axillary nodes and establishment of interaxillary pathway, R inguinal nodes and establishment of axilloinguinal pathway, then R UE moving fluid towards pathways spending extra time in any areas of fibrosis then retracing all steps. With education on principles of MLD, use of pump, and  answering all questions.   Compression bandaging: eucerin lotion, tg soft inside out, fingers 1-4, artiflex from hand to axilla, 1 6cm hand bandage, 1 8cm and 1 10cm from wrist to axilla with 8cm in herringbone.  Education throughout on when to remove, bandage care, etc.   01/30/22:   Eval visit discussed POC options showing pt day and night garments, velcro, bandaging options  PATIENT EDUCATION:  Education details: per today's visit Person educated: Patient Education method: Customer service manager Education comprehension: verbalized understanding   HOME EXERCISE PROGRAM: None  GARMENTS: ordering OTS size 2 long medi mondi and size 2 medi harmony gauntlet to have ready, would like to wait on night garment, and would like to get a custom made - Okay with self pay for all.  Pt ordering L&R ready wrap for her arm and gauntlet from compression guru.   ASSESSMENT:  CLINICAL IMPRESSION: Pts arm is starting to appear red from skin irritation so we decided to swtich to her day garment and she will wear nothing at night until her velcro comes. Pt is doing very well with MLD and will do that every day as well.   OBJECTIVE IMPAIRMENTS decreased knowledge of condition, decreased  knowledge of use of DME, and increased edema.   ACTIVITY LIMITATIONS lifting  PARTICIPATION LIMITATIONS: meal prep, cleaning, laundry, and community activity  PERSONAL FACTORS Age, Time since onset of injury/illness/exacerbation, and 1-2 comorbidities: sugical and radiation hx  are also affecting patient's functional outcome.   REHAB POTENTIAL: Good  CLINICAL DECISION MAKING: Stable/uncomplicated  EVALUATION COMPLEXITY: Low  GOALS: Goals reviewed with patient? Yes   LONG TERM GOALS: Target date: 03/14/2022    Pt will be educated on lymphedema and CDT principles Baseline:  Goal status: INITIAL  2.  Pt will decrease UE volume by at least 158m to decrease risk of infection Baseline:  Goal status: INITIAL  3.  Pt will decrease LLIS by 4 points to demonstrate improved status Baseline:  Goal status: INITIAL  4.  Pt will obtain compression for the UE Baseline:  Goal status: INITIAL    PLAN: PT FREQUENCY: 3x/week  PT DURATION: 6 weeks  PLANNED INTERVENTIONS: Therapeutic exercises, Therapeutic activity, Patient/Family education, Self Care, DME instructions, Manual therapy, and Re-evaluation  PLAN FOR NEXT SESSION:  CDT Lt UE, start teaching MLD, progress as able until plateau. - did she receive garments?   TStark Bray PT 02/28/2022, 4:24 PM

## 2022-03-01 ENCOUNTER — Ambulatory Visit: Payer: Medicare Other

## 2022-03-02 ENCOUNTER — Encounter: Payer: Self-pay | Admitting: Physical Therapy

## 2022-03-02 ENCOUNTER — Ambulatory Visit: Payer: Medicare Other | Admitting: Physical Therapy

## 2022-03-02 DIAGNOSIS — I890 Lymphedema, not elsewhere classified: Secondary | ICD-10-CM | POA: Diagnosis not present

## 2022-03-02 DIAGNOSIS — C50411 Malignant neoplasm of upper-outer quadrant of right female breast: Secondary | ICD-10-CM

## 2022-03-02 NOTE — Therapy (Signed)
OUTPATIENT PHYSICAL THERAPY ONCOLOGYTREATMENT  Patient Name: BURNICE VASSEL MRN: [id number] DOB:1945/06/22, 76 y.o., female Today's Date: 03/02/2022   PT End of Session - 03/02/22 1100     Visit Number 10    Number of Visits 19    Date for PT Re-Evaluation 03/14/22    PT Start Time 1059    PT Stop Time 7673    PT Time Calculation (min) 59 min    Activity Tolerance Patient tolerated treatment well    Behavior During Therapy [facility] for tasks assessed/performed                Past Medical History:  Diagnosis Date   Anxiety    Arthritis 2008   Asthma 1953   age 65   Breast cancer ([place]) dx'd 2017   right   Cancer ([place])    breast cancer   Chronic vaginitis 1998   Fibromyalgia 1982   GERD (gastroesophageal reflux disease)    since at least 2011   Hemorrhoids 2009   History of radiation therapy 01/18/16-03/05/16   right breast/nodal areas 45 Gy, right breast boost 16 Gy   Hypoglycemia 2003   Hypothyroidism 1964   age 58   Interstitial cystitis 1998   Irritable bowel syndrome 06/14/2009   Qualifier: Diagnosis of  By: Patsy Baltimore RN, Denise     Menopausal symptoms    Osteoporosis 12/24/2006   Personal history of chemotherapy    Personal history of radiation therapy    Pneumonia 1979   varicella   PONV (postoperative nausea and vomiting) 1979   Past Surgical History:  Procedure Laterality Date   BLADDER HYPEREXTENSION     X4, prior to 01/2006   [place] LUMPECTOMY Right    2017   BREAST LUMPECTOMY WITH NEEDLE LOCALIZATION AND AXILLARY LYMPH NODE DISSECTION Right 12/05/2015   Procedure: RIGHT BREAST NEEDLE LOCALIZED (WIRES X 2) LUMPECTOMY WITH AXILLARY LYMPH NODE DISSECTION;  Surgeon: Fanny Skates, MD;  Location: [place];  Service: General;  Laterality: Right;   BREAST LUMPECTOMY WITH RADIOACTIVE SEED LOCALIZATION Left 12/05/2015   Procedure: RADIOACTIVE SEED GUIDED LEFT BREAST LUMPECTOMY;  Surgeon: Fanny Skates, MD;  Location: [place];  Service: General;  Laterality: Left;   BREAST SURGERY     BUNIONECTOMY     prior to 01/2006   [place]  02/08/2006   DR. INGRAM   COLONOSCOPY     CORONARY STENT INTERVENTION N/A 01/01/2017   Procedure: CORONARY STENT INTERVENTION;  Surgeon: Jettie Booze, MD;  Location: [place] [facility];  Service: Cardiovascular;  Laterality: N/A;   DENTAL SURGERY  2009-2016   13 dental implants   LEFT HEART CATH AND CORONARY ANGIOGRAPHY N/A 01/01/2017   Procedure: LEFT HEART CATH AND CORONARY ANGIOGRAPHY;  Surgeon: Jettie Booze, MD;  Location: [place] [facility];  Service: Cardiovascular;  Laterality: N/A;   LEG SURGERY  07/06/2004   Broken tibia   NASAL SINUS SURGERY     X4; prior to 01/2006   [facility] REMOVAL Right 12/05/2015   Procedure: REMOVAL PORT-A-CATH;  Surgeon: Fanny Skates, MD;  Location: [place];  Service: General;  Laterality: Right;   PORTACATH PLACEMENT Left 09/08/2015   Procedure: INSERTION OF PORT-A-CATH ;  Surgeon: Fanny Skates, MD;  Location: [place];  Service: General;  Laterality: Left;   Patient Active Problem List   Diagnosis Date Noted  Caregiver stress 07/26/2020   Adjustment disorder with mixed anxiety and depressed mood 12/01/2019   Lumbar radiculopathy 05/11/2019   Bilateral stenosis of lateral recess of lumbar spine 05/11/2019   Hyperlipidemia 12/23/2018   Chronic diastolic CHF (congestive heart failure) ([place]) 11/24/2018   Abnormal transaminases 03/24/2018   CAD (coronary artery disease) 02/11/2017   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   Status post coronary artery stent placement    Non-ST elevation (NSTEMI) myocardial infarction [facility])    Depression with anxiety 12/31/2016   Chest pain 12/31/2016   Hyponatremia 12/31/2016   Chronic pain 12/31/2016   Essential hypertension 12/31/2016   Unstable angina (HCC)    Family history of breast cancer in female 02/08/2016   Rash and  nonspecific skin eruption 10/03/2015   Sensorineural hearing loss (SNHL) of both ears 09/05/2015   Malignant neoplasm of upper-outer quadrant of right breast in female, estrogen receptor positive ([place]) 09/01/2015   Sclerosing adenosis of left breast 09/01/2015   Asthma 09/12/2011   Chronic vaginitis    Fibromyalgia    Interstitial cystitis    UNSPECIFIED HYPOTHYROIDISM 06/14/2009   DEPRESSIVE DISORDER NOT ELSEWHERE CLASSIFIED 06/14/2009   Hereditary and idiopathic peripheral neuropathy 06/14/2009   UNSPEC HEMORRHOIDS WITHOUT MENTION COMPLICATION 99/37/1696   ALLERGIC RHINITIS CAUSE UNSPECIFIED 06/14/2009   ESOPHAGEAL REFLUX 06/14/2009   Irritable bowel syndrome 06/14/2009   INSOMNIA UNSPECIFIED 06/14/2009   Osteoporosis 08/27/2007    PCP: Seward Carol, MD  REFERRING PROVIDER: Wilber Bihari, NP  REFERRING DIAG: Rt UE lymphedema  THERAPY DIAG:  Lymphedema, not elsewhere classified  Malignant neoplasm of upper-outer quadrant of right breast in female, estrogen receptor positive ([place])  ONSET DATE: 12/30/19  Rationale for Evaluation and Treatment Rehabilitation  SUBJECTIVE                                                                                                                                                                                           SUBJECTIVE STATEMENT: I am ready to not wrap. 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 She was treated with neoadjuvant chemotherapy and had a right and left lumpectomy with right axillary lymph node dissection on 12/05/15, with 15 out of 17 lymph nodes being positive. Radiation completed. She has a history of fibromyalgia and osteoporosis. They kept thinking that I had cellulitis in the arm starting 2 years ago and had been off and on antibiotics.  I have a compression sleeve.  CT and doppler negative.    PAIN:  Are  you having pain? No  PRECAUTIONS: Rt lymphedema  WEIGHT BEARING RESTRICTIONS No  FALLS:  Has patient fallen in last 6 months? No  LIVING ENVIRONMENT: Lives with: lives alone with caregiver 24/7 - will be going to [place] next month  Lives in: House/apartment  OCCUPATION: Retired   LEISURE: not discussed  HAND DOMINANCE : right   PRIOR LEVEL OF FUNCTION: uses care aide 24/7  PATIENT GOALS what to do for the swelling   OBJECTIVE  PALPATION: Non pitting  OBSERVATIONS / OTHER ASSESSMENTS: Rt UE significantly larger  POSTURE: rounded shoulders    LYMPHEDEMA ASSESSMENTS:  LANDMARK RIGHT  eval 02/12/22 02/14/22 02/19/22 02/21/22 02/23/22 02/26/22 03/02/22  axilla 33 33  31 31.9 32    15cm 33.5 33.5 33 32 33 32 32   10 cm proximal to olecranon process 35 34 33 32 32 30.5 31.5 31.4  Olecranon process 28.5 28.5 27 27.2 27.1 26.2 27.5 26  15cm proximal  26 26.2 22.9 23.5 23.'5 23    10 '$ cm proximal to ulnar styloid process 22.2 23 20.3 20.7 21.4 20 20.3 21  Just proximal to ulnar styloid process 15 15 15.7 15.0 15 14.8 14.7 15.5  Across hand at thumb web space 18.3 18.3 17.1 17.'4 17 17 '$ 17.5 18  At base of 2nd digit 7.4 6.2 5.9 5.9 5.9 5.5 5.5 6.1  (Blank rows = not tested) Length of 10 2039m  LANDMARK LEFT  eval  axilla 31  15cm 30  10 cm proximal to olecranon process 28.8  Olecranon process 24.5  15cm 20.8  10 cm proximal to ulnar styloid process 18.2  Just proximal to ulnar styloid process 14  Across hand at thumb web space 17  At base of 2nd digit 5.8  (Blank rows = not tested)   16788m LLIS:  10  TODAY'S TREATMENT  03/02/22:  Removed sleeve and re measured circumferences. Her circumferences are up especially at her hand. The night time garment should arrive today. Manual lymph drainage in supine as follows: short neck, left axillary nodes, right inguinal nodes, superficial and deep abdominals; anterior inter-axillary anastamoses, right axillo-inguinal  anastamoses. Right upper extremity from fingers and dorsal hand to lateral shoulder redirecting along pathways. Donned sleeve at end of session.  02/27/22:  Removed bandages and pt washed arm and hand with soap and water  Reviewed self MLD for the UE with PT performing all steps with vcs, tcs, and demonstration.  Pt performed in seated with cueing as needed. Pt donned day sleeve and we reviewed how to use velcro garment for when it comes.   02/26/22:  Removed bandages and pt washed arm and hand with soap and water  Remeasured UE Applied new day sleeve which fits well.  Pt practiced putting on sleeve x 4 (without donning aid x 2, and with arm butler and glove x 2).  Pt was given donning instruction and how to order arm butler and donning gloves.  Gauntlet was also donned with education on washing, care, and use of both garments.   Started education on self MLD for the UE with brief education session with PT performing all steps with vcs, tcs, and demonstration.  Pt performed in seated and handout given.   Initially decided on pt wearing sleeve home, but after discussion we decided to bandage until night garments have arrived.   Compression bandaging: tg soft inside out, elastomull to fingers 1-4 then covered with paper tape, artiflex from hand to axilla, 1/2 grey foam used at antecubital fossa for padding,  2nd set of 1 6cm hand bandage, 1 8cm in herringbone fashion and 1 10cm from wrist to axilla given.   02/23/22:  Removed bandages and pt washed arm and hand with soap and water  Remeasured UE - it has decreased throughout since last session. Compression bandaging: tg soft inside out, elastomull to fingers 1-4 then covered with paper tape, artiflex from hand to axilla, 1/2 grey foam used at antecubital fossa for padding, 2nd set of 1 6cm hand bandage, 1 8cm in herringbone fashion and 1 10cm from wrist to axilla given.   02/21/22:  Removed bandages and pt washed arm and hand with soap and water   Remeasured UE - discussed night time compression options - Educated pt about importance of a night time garment. Showed pt several different night time garment options and pt was agreeable to a velcro garment because it was not as thick as a tribute night or circaid profile. She fit in to a medium L&R arm wrap - avg length and a small L&R gauntlet. She was given info to obtain from compression guru.  Compression bandaging: tg soft inside out, elastomull to fingers 1-4 then covered with paper tape, artiflex from hand to axilla, 1/2 grey foam used at antecubital fossa for padding, 2nd set of 1 6cm hand bandage, 1 8cm in herringbone fashion and 1 10cm from wrist to axilla given.  02/19/22:  Removed bandages and pt washed arm and hand with soap and water  Remeasured UE - discussed compression options - compared Rt UE size to medi mondi size chart and pt would be in a size 2 unless reducing quite a bit so she would like to order this size 2 long with a matching gauntlet to have ready until she gets a custom flat knit in a color she would like.   Pt was given compression guru information.  Also discussed night garment which pt would like to wait on at this time.   Compression bandaging: tg soft inside out, elastomull to fingers 1-4 then covered with paper tape, artiflex from hand to axilla, rosidal soft rectangle used at antecubital fossa for padding, 2nd set of 1 6cm hand bandage, 1 8cm in herringbone fashion and 1 10cm from wrist to axilla given.   02/16/22:  Removed bandages and pt washed arm and hand with soap and water prior to MLD.  MLD: In supine with 2 pillows: MLD: In supine: Short neck, 5 diaphragmatic breaths, L axillary nodes and establishment of interaxillary pathway, R inguinal nodes and establishment of axilloinguinal pathway, then R UE moving fluid towards pathways spending extra time in any areas of fibrosis then retracing all steps. With education on principles of MLD, use of pump, and  answering all questions.   Compression bandaging: tg soft inside out, elastomull to fingers 1-5 then covered with paper tape, artiflex from hand to axilla, 1 6cm hand bandage, 1 8cm in herringbone fashion and 1 10cm from wrist to axilla   02/14/22:  Education on CDT principles MLD: In supine with 2 pillows: MLD: In supine: Short neck, 5 diaphragmatic breaths, L axillary nodes and establishment of interaxillary pathway, R inguinal nodes and establishment of axilloinguinal pathway, then R UE moving fluid towards pathways spending extra time in any areas of fibrosis then retracing all steps. With education on principles of MLD, use of pump, and answering all questions.   Compression bandaging: tg soft inside out, elastomull to fingers 1-5 then covered with paper tape, artiflex from hand to axilla, 1  6cm hand bandage, 1 8cm and 1 10cm from wrist to axilla   02/12/22:  Education on CDT principles MLD: In supine with 2 pillows: MLD: In supine: Short neck, 5 diaphragmatic breaths, L axillary nodes and establishment of interaxillary pathway, R inguinal nodes and establishment of axilloinguinal pathway, then R UE moving fluid towards pathways spending extra time in any areas of fibrosis then retracing all steps. With education on principles of MLD, use of pump, and answering all questions.   Compression bandaging: eucerin lotion, tg soft inside out, fingers 1-4, artiflex from hand to axilla, 1 6cm hand bandage, 1 8cm and 1 10cm from wrist to axilla with 8cm in herringbone.  Education throughout on when to remove, bandage care, etc.   01/30/22:   Eval visit discussed POC options showing pt day and night garments, velcro, bandaging options  PATIENT EDUCATION:  Education details: per today's visit Person educated: Patient Education method: Customer service manager Education comprehension: verbalized understanding   HOME EXERCISE PROGRAM: None  GARMENTS: ordering OTS size 2 long medi mondi and size  2 medi harmony gauntlet to have ready, would like to wait on night garment, and would like to get a custom made - Okay with self pay for all.  Pt ordering L&R ready wrap for her arm and gauntlet from compression guru.   ASSESSMENT:  CLINICAL IMPRESSION: Pt demonstrates increased edema throughout her RUE especially at her hand. Educated pt on the importance of always wearing the glove with the sleeve to manage her hand swelling. Once she receives her night time garment (hopefully today) she will begin to wear that which should help with her increased swelling. Pt verbalized understanding.   OBJECTIVE IMPAIRMENTS decreased knowledge of condition, decreased knowledge of use of DME, and increased edema.   ACTIVITY LIMITATIONS lifting  PARTICIPATION LIMITATIONS: meal prep, cleaning, laundry, and community activity  PERSONAL FACTORS Age, Time since onset of injury/illness/exacerbation, and 1-2 comorbidities: sugical and radiation hx  are also affecting patient's functional outcome.   REHAB POTENTIAL: Good  CLINICAL DECISION MAKING: Stable/uncomplicated  EVALUATION COMPLEXITY: Low  GOALS: Goals reviewed with patient? Yes   LONG TERM GOALS: Target date: 03/14/2022    Pt will be educated on lymphedema and CDT principles Baseline:  Goal status: INITIAL  2.  Pt will decrease UE volume by at least 139m to decrease risk of infection Baseline:  Goal status: INITIAL  3.  Pt will decrease LLIS by 4 points to demonstrate improved status Baseline:  Goal status: INITIAL  4.  Pt will obtain compression for the UE Baseline:  Goal status: INITIAL    PLAN: PT FREQUENCY: 3x/week  PT DURATION: 6 weeks  PLANNED INTERVENTIONS: Therapeutic exercises, Therapeutic activity, Patient/Family education, Self Care, DME instructions, Manual therapy, and Re-evaluation  PLAN FOR NEXT SESSION:  CDT Lt UE, start teaching MLD, progress as able until plateau. - did she receive garments?   [facility] PT 03/02/2022, 12:03 PM

## 2022-03-05 ENCOUNTER — Ambulatory Visit: Payer: Medicare Other

## 2022-03-07 ENCOUNTER — Ambulatory Visit: Payer: Medicare Other | Admitting: Physical Therapy

## 2022-03-07 DIAGNOSIS — Z170 Estrogen receptor positive status [ER+]: Secondary | ICD-10-CM

## 2022-03-07 DIAGNOSIS — I890 Lymphedema, not elsewhere classified: Secondary | ICD-10-CM | POA: Diagnosis not present

## 2022-03-07 NOTE — Therapy (Signed)
OUTPATIENT PHYSICAL THERAPY ONCOLOGYTREATMENT  Patient Name: [name] MRN: [id number] DOB:1945/12/07, 76 y.o., female Today's Date: 03/07/2022   PT End of Session - 03/07/22 1356     PT Stop Time --   then 13:30-13:40 for sleeve donning               Past Medical History:  Diagnosis Date   Anxiety    Arthritis 2008   Asthma 1953   age 80   Breast cancer ([place]) dx'd 2017   right   Cancer ([place])    breast cancer   Chronic vaginitis 1998   Fibromyalgia 1982   GERD (gastroesophageal reflux disease)    since at least 2011   Hemorrhoids 2009   History of radiation therapy 01/18/16-03/05/16   right breast/nodal areas 45 Gy, right breast boost 16 Gy   Hypoglycemia 2003   Hypothyroidism 1964   age 75   Interstitial cystitis 1998   Irritable bowel syndrome 06/14/2009   Qualifier: Diagnosis of  By: Patsy Baltimore RN, Denise     Menopausal symptoms    Osteoporosis 12/24/2006   Personal history of chemotherapy    Personal history of radiation therapy    Pneumonia 1979   varicella   PONV (postoperative nausea and vomiting) 1979   Past Surgical History:  Procedure Laterality Date   BLADDER HYPEREXTENSION     X4, prior to 01/2006   [place] LUMPECTOMY Right    2017   BREAST LUMPECTOMY WITH NEEDLE LOCALIZATION AND AXILLARY LYMPH NODE DISSECTION Right 12/05/2015   Procedure: RIGHT BREAST NEEDLE LOCALIZED (WIRES X 2) LUMPECTOMY WITH AXILLARY LYMPH NODE DISSECTION;  Surgeon: Fanny Skates, MD;  Location: [place];  Service: General;  Laterality: Right;   BREAST LUMPECTOMY WITH RADIOACTIVE SEED LOCALIZATION Left 12/05/2015   Procedure: RADIOACTIVE SEED GUIDED LEFT BREAST LUMPECTOMY;  Surgeon: Fanny Skates, MD;  Location: [place];  Service: General;  Laterality: Left;   BREAST SURGERY     BUNIONECTOMY     prior to 01/2006   [place]  02/08/2006   DR. INGRAM   COLONOSCOPY      CORONARY STENT INTERVENTION N/A 01/01/2017   Procedure: CORONARY STENT INTERVENTION;  Surgeon: Jettie Booze, MD;  Location: [place] [facility];  Service: Cardiovascular;  Laterality: N/A;   DENTAL SURGERY  2009-2016   13 dental implants   LEFT HEART CATH AND CORONARY ANGIOGRAPHY N/A 01/01/2017   Procedure: LEFT HEART CATH AND CORONARY ANGIOGRAPHY;  Surgeon: Jettie Booze, MD;  Location: [place] [facility];  Service: Cardiovascular;  Laterality: N/A;   LEG SURGERY  07/06/2004   Broken tibia   NASAL SINUS SURGERY     X4; prior to 01/2006   [facility] REMOVAL Right 12/05/2015   Procedure: REMOVAL PORT-A-CATH;  Surgeon: Fanny Skates, MD;  Location: [place];  Service: General;  Laterality: Right;   PORTACATH PLACEMENT Left 09/08/2015   Procedure: INSERTION OF PORT-A-CATH ;  Surgeon: Fanny Skates, MD;  Location: [place];  Service: General;  Laterality: Left;   Patient Active Problem List   Diagnosis Date Noted   Caregiver stress 07/26/2020   Adjustment disorder with mixed anxiety and depressed mood 12/01/2019   Lumbar radiculopathy 05/11/2019   Bilateral stenosis of lateral recess of lumbar spine 05/11/2019   Hyperlipidemia 12/23/2018   Chronic diastolic CHF (congestive heart failure) ([place]) 11/24/2018   Abnormal transaminases 03/24/2018  CAD (coronary artery disease) 02/11/2017   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   Status post coronary artery stent placement    Non-ST elevation (NSTEMI) myocardial infarction [facility])    Depression with anxiety 12/31/2016   Chest pain 12/31/2016   Hyponatremia 12/31/2016   Chronic pain 12/31/2016   Essential hypertension 12/31/2016   Unstable angina ([place])    Family history of breast cancer in female 02/08/2016   Rash and nonspecific skin eruption 10/03/2015   Sensorineural hearing loss (SNHL) of both ears 09/05/2015   Malignant neoplasm of upper-outer quadrant of right breast in female, estrogen receptor positive ([place]) 09/01/2015   Sclerosing  adenosis of left breast 09/01/2015   Asthma 09/12/2011   Chronic vaginitis    Fibromyalgia    Interstitial cystitis    UNSPECIFIED HYPOTHYROIDISM 06/14/2009   DEPRESSIVE DISORDER NOT ELSEWHERE CLASSIFIED 06/14/2009   Hereditary and idiopathic peripheral neuropathy 06/14/2009   UNSPEC HEMORRHOIDS WITHOUT MENTION COMPLICATION 81/19/1478   ALLERGIC RHINITIS CAUSE UNSPECIFIED 06/14/2009   ESOPHAGEAL REFLUX 06/14/2009   Irritable bowel syndrome 06/14/2009   INSOMNIA UNSPECIFIED 06/14/2009   Osteoporosis 08/27/2007    PCP: Seward Carol, MD  REFERRING PROVIDER: Wilber Bihari, NP  REFERRING DIAG: Rt UE lymphedema  THERAPY DIAG:  Lymphedema, not elsewhere classified  Malignant neoplasm of upper-outer quadrant of right breast in female, estrogen receptor positive ([place])  ONSET DATE: 12/30/19  Rationale for Evaluation and Treatment Rehabilitation  SUBJECTIVE                                                                                                                                                                                           SUBJECTIVE STATEMENT: Pt states her sister died earlier this week.  She also is preparing to move to [place]. Pt would like to know if she can decrease her visits to one time a week since she has so much going on right now.  She understands she needs to  come in to make sure that her new garments work out well and that her skin is healing.   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 She was treated with neoadjuvant chemotherapy and had a right and left lumpectomy with right axillary lymph node dissection on 12/05/15, with 15 out of 17 lymph nodes being positive. Radiation completed. She has a history of fibromyalgia and osteoporosis. They kept thinking that I had cellulitis in the arm starting 2 years ago and had been off and on antibiotics.  I have a compression sleeve.  CT and doppler  negative.    PAIN:  Are you having  pain? No  PRECAUTIONS: Rt lymphedema  WEIGHT BEARING RESTRICTIONS No  FALLS:  Has patient fallen in last 6 months? No  LIVING ENVIRONMENT: Lives with: lives alone with caregiver 24/7 - will be going to [place] next month  Lives in: House/apartment  OCCUPATION: Retired   LEISURE: not discussed  HAND DOMINANCE : right   PRIOR LEVEL OF FUNCTION: uses care aide 24/7  PATIENT GOALS what to do for the swelling   OBJECTIVE  PALPATION: Non pitting  OBSERVATIONS / OTHER ASSESSMENTS: Rt UE significantly larger  POSTURE: rounded shoulders    LYMPHEDEMA ASSESSMENTS:  LANDMARK RIGHT  eval 02/12/22 02/14/22 02/19/22 02/21/22 02/23/22 02/26/22 03/02/22  axilla 33 33  31 31.9 32    15cm 33.5 33.5 33 32 33 32 32   10 cm proximal to olecranon process 35 34 33 32 32 30.5 31.5 31.4  Olecranon process 28.5 28.5 27 27.2 27.1 26.2 27.5 26  15cm proximal  26 26.2 22.9 23.5 23.'5 23    10 '$ cm proximal to ulnar styloid process 22.2 23 20.3 20.7 21.4 20 20.3 21  Just proximal to ulnar styloid process 15 15 15.7 15.0 15 14.8 14.7 15.5  Across hand at thumb web space 18.3 18.3 17.1 17.'4 17 17 '$ 17.5 18  At base of 2nd digit 7.4 6.2 5.9 5.9 5.9 5.5 5.5 6.1  (Blank rows = not tested) Length of 10 2082m  LANDMARK LEFT  eval  axilla 31  15cm 30  10 cm proximal to olecranon process 28.8  Olecranon process 24.5  15cm 20.8  10 cm proximal to ulnar styloid process 18.2  Just proximal to ulnar styloid process 14  Across hand at thumb web space 17  At base of 2nd digit 5.8  (Blank rows = not tested)   16776m LLIS:  10  TODAY'S TREATMENT   03/07/2022 Removed sleeve. Noted pt to have red marks at right elbow that is slighly raised and itchy.  Possibly could be from skin folding under her velcro compression nighttime garment applying pressure against her distal humerus/olecranon.  Pt made aware of skin condidtion with mirror. Gave her tg soft to apply  under velcro making sure that she has no folds of tg soft of skin folds under velcro,Also gave pt a piece of thin gray foam to pad elbow if needed. She will ask her caregiver RiVelva Harmano check her application of the velcro garment to make sure there is no skin wrinkles or pressure on the elbow.  Manual lymph drainage in supine as follows: short neck, left axillary nodes, right inguinal nodes, superficial and deep abdominals; anterior inter-axillary anastamoses, right axillo-inguinal anastamoses. Right upper extremity from fingers and dorsal hand to lateral shoulder redirecting along pathways Pt measured for her custom sleeve and gauntlet. Applied garement with use of Medi BuMelina CopaAsked pt bring in her velcro garment     03/02/22:  Removed sleeve and re measured circumferences. Her circumferences are up especially at her hand. The night time garment should arrive today. Manual lymph drainage in supine as follows: short neck, left axillary nodes, right inguinal nodes, superficial and deep abdominals; anterior inter-axillary anastamoses, right axillo-inguinal anastamoses. Right upper extremity from fingers and dorsal hand to lateral shoulder redirecting along pathways. Donned sleeve at end of session.  02/27/22:  Removed bandages and pt washed arm and hand with soap and water  Reviewed self MLD for the UE with PT performing all steps with vcs, tcs, and demonstration.  Pt performed in seated with  cueing as needed. Pt donned day sleeve and we reviewed how to use velcro garment for when it comes.   02/26/22:  Removed bandages and pt washed arm and hand with soap and water  Remeasured UE Applied new day sleeve which fits well.  Pt practiced putting on sleeve x 4 (without donning aid x 2, and with arm butler and glove x 2).  Pt was given donning instruction and how to order arm butler and donning gloves.  Gauntlet was also donned with education on washing, care, and use of both garments.   Started education on  self MLD for the UE with brief education session with PT performing all steps with vcs, tcs, and demonstration.  Pt performed in seated and handout given.   Initially decided on pt wearing sleeve home, but after discussion we decided to bandage until night garments have arrived.   Compression bandaging: tg soft inside out, elastomull to fingers 1-4 then covered with paper tape, artiflex from hand to axilla, 1/2 grey foam used at antecubital fossa for padding, 2nd set of 1 6cm hand bandage, 1 8cm in herringbone fashion and 1 10cm from wrist to axilla given.   02/23/22:  Removed bandages and pt washed arm and hand with soap and water  Remeasured UE - it has decreased throughout since last session. Compression bandaging: tg soft inside out, elastomull to fingers 1-4 then covered with paper tape, artiflex from hand to axilla, 1/2 grey foam used at antecubital fossa for padding, 2nd set of 1 6cm hand bandage, 1 8cm in herringbone fashion and 1 10cm from wrist to axilla given.   02/21/22:  Removed bandages and pt washed arm and hand with soap and water  Remeasured UE - discussed night time compression options - Educated pt about importance of a night time garment. Showed pt several different night time garment options and pt was agreeable to a velcro garment because it was not as thick as a tribute night or circaid profile. She fit in to a medium L&R arm wrap - avg length and a small L&R gauntlet. She was given info to obtain from compression guru.  Compression bandaging: tg soft inside out, elastomull to fingers 1-4 then covered with paper tape, artiflex from hand to axilla, 1/2 grey foam used at antecubital fossa for padding, 2nd set of 1 6cm hand bandage, 1 8cm in herringbone fashion and 1 10cm from wrist to axilla given.  02/19/22:  Removed bandages and pt washed arm and hand with soap and water  Remeasured UE - discussed compression options - compared Rt UE size to medi mondi size chart and pt would be  in a size 2 unless reducing quite a bit so she would like to order this size 2 long with a matching gauntlet to have ready until she gets a custom flat knit in a color she would like.   Pt was given compression guru information.  Also discussed night garment which pt would like to wait on at this time.   Compression bandaging: tg soft inside out, elastomull to fingers 1-4 then covered with paper tape, artiflex from hand to axilla, rosidal soft rectangle used at antecubital fossa for padding, 2nd set of 1 6cm hand bandage, 1 8cm in herringbone fashion and 1 10cm from wrist to axilla given.   02/16/22:  Removed bandages and pt washed arm and hand with soap and water prior to MLD.  MLD: In supine with 2 pillows: MLD: In supine: Short neck, 5 diaphragmatic  breaths, L axillary nodes and establishment of interaxillary pathway, R inguinal nodes and establishment of axilloinguinal pathway, then R UE moving fluid towards pathways spending extra time in any areas of fibrosis then retracing all steps. With education on principles of MLD, use of pump, and answering all questions.   Compression bandaging: tg soft inside out, elastomull to fingers 1-5 then covered with paper tape, artiflex from hand to axilla, 1 6cm hand bandage, 1 8cm in herringbone fashion and 1 10cm from wrist to axilla   02/14/22:  Education on CDT principles MLD: In supine with 2 pillows: MLD: In supine: Short neck, 5 diaphragmatic breaths, L axillary nodes and establishment of interaxillary pathway, R inguinal nodes and establishment of axilloinguinal pathway, then R UE moving fluid towards pathways spending extra time in any areas of fibrosis then retracing all steps. With education on principles of MLD, use of pump, and answering all questions.   Compression bandaging: tg soft inside out, elastomull to fingers 1-5 then covered with paper tape, artiflex from hand to axilla, 1 6cm hand bandage, 1 8cm and 1 10cm from wrist to axilla   02/12/22:   Education on CDT principles MLD: In supine with 2 pillows: MLD: In supine: Short neck, 5 diaphragmatic breaths, L axillary nodes and establishment of interaxillary pathway, R inguinal nodes and establishment of axilloinguinal pathway, then R UE moving fluid towards pathways spending extra time in any areas of fibrosis then retracing all steps. With education on principles of MLD, use of pump, and answering all questions.   Compression bandaging: eucerin lotion, tg soft inside out, fingers 1-4, artiflex from hand to axilla, 1 6cm hand bandage, 1 8cm and 1 10cm from wrist to axilla with 8cm in herringbone.  Education throughout on when to remove, bandage care, etc.   01/30/22:   Eval visit discussed POC options showing pt day and night garments, velcro, bandaging options  PATIENT EDUCATION:  Education details: per today's visit Person educated: Patient Education method: Customer service manager Education comprehension: verbalized understanding   HOME EXERCISE PROGRAM: None  GARMENTS: ordering OTS size 2 long medi mondi and size 2 medi harmony gauntlet to have ready, would like to wait on night garment, and would like to get a custom made - Okay with self pay for all.  Pt ordering L&R ready wrap for her arm and gauntlet from compression guru.   ASSESSMENT:  CLINICAL IMPRESSION: Pt seems to be doing well with her lymphedema management though she still needs PT to make sure that her garments fit well and that her skin redness had decreased she is ok to decrease to one time a week for now.    OBJECTIVE IMPAIRMENTS decreased knowledge of condition, decreased knowledge of use of DME, and increased edema.   ACTIVITY LIMITATIONS lifting  PARTICIPATION LIMITATIONS: meal prep, cleaning, laundry, and community activity  PERSONAL FACTORS Age, Time since onset of injury/illness/exacerbation, and 1-2 comorbidities: sugical and radiation hx  are also affecting patient's functional outcome.    REHAB POTENTIAL: Good  CLINICAL DECISION MAKING: Stable/uncomplicated  EVALUATION COMPLEXITY: Low  GOALS: Goals reviewed with patient? Yes   LONG TERM GOALS: Target date: 03/14/2022    Pt will be educated on lymphedema and CDT principles Baseline:  Goal status: INITIAL  2.  Pt will decrease UE volume by at least 176m to decrease risk of infection Baseline:  Goal status: INITIAL  3.  Pt will decrease LLIS by 4 points to demonstrate improved status Baseline:  Goal status: INITIAL  4.  Pt will obtain compression for the UE Baseline:  Goal status: INITIAL    PLAN: PT FREQUENCY: 3x/week  PT DURATION: 6 weeks  PLANNED INTERVENTIONS: Therapeutic exercises, Therapeutic activity, Patient/Family education, Self Care, DME instructions, Manual therapy, and Re-evaluation  PLAN FOR NEXT SESSION:  Check skin at elbow. Check velcro garment, and sleeve CDT Lt UE, start teaching MLD, progress as able until plateau. Decrease to one time a week if ok to do so.   Donato Heinz. Owens Shark PT  Norwood Levo, PT 03/07/2022, 1:58 PM

## 2022-03-09 ENCOUNTER — Ambulatory Visit: Payer: Medicare Other

## 2022-03-12 ENCOUNTER — Encounter: Payer: Self-pay | Admitting: Rehabilitation

## 2022-03-12 ENCOUNTER — Ambulatory Visit: Payer: Medicare Other | Admitting: Rehabilitation

## 2022-03-12 DIAGNOSIS — I890 Lymphedema, not elsewhere classified: Secondary | ICD-10-CM

## 2022-03-12 DIAGNOSIS — Z170 Estrogen receptor positive status [ER+]: Secondary | ICD-10-CM

## 2022-03-12 NOTE — Therapy (Addendum)
OUTPATIENT PHYSICAL THERAPY ONCOLOGYTREATMENT  Patient Name: [name] MRN: [id number] DOB:02/19/1946, 76 y.o., female Today's Date: 03/13/2022   PT End of Session - 03/13/22 0849     Visit Number 12    Number of Visits 19    Date for PT Re-Evaluation 03/14/22    PT Start Time 1210    PT Stop Time 1300    PT Time Calculation (min) 50 min    Activity Tolerance Patient tolerated treatment well    Behavior During Therapy [facility] for tasks assessed/performed                Past Medical History:  Diagnosis Date   Anxiety    Arthritis 2008   Asthma 1953   age 31   Breast cancer ([place]) dx'd 2017   right   Cancer ([place])    breast cancer   Chronic vaginitis 1998   Fibromyalgia 1982   GERD (gastroesophageal reflux disease)    since at least 2011   Hemorrhoids 2009   History of radiation therapy 01/18/16-03/05/16   right breast/nodal areas 45 Gy, right breast boost 16 Gy   Hypoglycemia 2003   Hypothyroidism 1964   age 33   Interstitial cystitis 1998   Irritable bowel syndrome 06/14/2009   Qualifier: Diagnosis of  By: Patsy Baltimore RN, Denise     Menopausal symptoms    Osteoporosis 12/24/2006   Personal history of chemotherapy    Personal history of radiation therapy    Pneumonia 1979   varicella   PONV (postoperative nausea and vomiting) 1979   Past Surgical History:  Procedure Laterality Date   BLADDER HYPEREXTENSION     X4, prior to 01/2006   [place] LUMPECTOMY Right    2017   BREAST LUMPECTOMY WITH NEEDLE LOCALIZATION AND AXILLARY LYMPH NODE DISSECTION Right 12/05/2015   Procedure: RIGHT BREAST NEEDLE LOCALIZED (WIRES X 2) LUMPECTOMY WITH AXILLARY LYMPH NODE DISSECTION;  Surgeon: Fanny Skates, MD;  Location: [place];  Service: General;  Laterality: Right;   BREAST LUMPECTOMY WITH RADIOACTIVE SEED LOCALIZATION Left 12/05/2015   Procedure: RADIOACTIVE SEED GUIDED LEFT BREAST LUMPECTOMY;  Surgeon: Fanny Skates, MD;  Location: [place];  Service: General;  Laterality: Left;   BREAST SURGERY     BUNIONECTOMY     prior to 01/2006   [place]  02/08/2006   DR. INGRAM   COLONOSCOPY     CORONARY STENT INTERVENTION N/A 01/01/2017   Procedure: CORONARY STENT INTERVENTION;  Surgeon: Jettie Booze, MD;  Location: [place] CV LAB;  Service: Cardiovascular;  Laterality: N/A;   DENTAL SURGERY  2009-2016   13 dental implants   LEFT HEART CATH AND CORONARY ANGIOGRAPHY N/A 01/01/2017   Procedure: LEFT HEART CATH AND CORONARY ANGIOGRAPHY;  Surgeon: Jettie Booze, MD;  Location: [place] CV LAB;  Service: Cardiovascular;  Laterality: N/A;   LEG SURGERY  07/06/2004   Broken tibia   NASAL SINUS SURGERY     X4; prior to 01/2006   [facility] REMOVAL Right 12/05/2015   Procedure: REMOVAL PORT-A-CATH;  Surgeon: Fanny Skates, MD;  Location: [place];  Service: General;  Laterality: Right;   PORTACATH PLACEMENT Left 09/08/2015   Procedure: INSERTION OF PORT-A-CATH ;  Surgeon: Fanny Skates, MD;  Location: [place];  Service: General;  Laterality: Left;   Patient Active Problem List   Diagnosis Date Noted  Caregiver stress 07/26/2020   Adjustment disorder with mixed anxiety and depressed mood 12/01/2019   Lumbar radiculopathy 05/11/2019   Bilateral stenosis of lateral recess of lumbar spine 05/11/2019   Hyperlipidemia 12/23/2018   Chronic diastolic CHF (congestive heart failure) ([place]) 11/24/2018   Abnormal transaminases 03/24/2018   CAD (coronary artery disease) 02/11/2017   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   Status post coronary artery stent placement    Non-ST elevation (NSTEMI) myocardial infarction [facility])    Depression with anxiety 12/31/2016   Chest pain 12/31/2016   Hyponatremia 12/31/2016   Chronic pain 12/31/2016   Essential hypertension 12/31/2016   Unstable angina (HCC)    Family history of breast cancer in female 02/08/2016   Rash and  nonspecific skin eruption 10/03/2015   Sensorineural hearing loss (SNHL) of both ears 09/05/2015   Malignant neoplasm of upper-outer quadrant of right breast in female, estrogen receptor positive ([place]) 09/01/2015   Sclerosing adenosis of left breast 09/01/2015   Asthma 09/12/2011   Chronic vaginitis    Fibromyalgia    Interstitial cystitis    UNSPECIFIED HYPOTHYROIDISM 06/14/2009   DEPRESSIVE DISORDER NOT ELSEWHERE CLASSIFIED 06/14/2009   Hereditary and idiopathic peripheral neuropathy 06/14/2009   UNSPEC HEMORRHOIDS WITHOUT MENTION COMPLICATION 20/25/4270   ALLERGIC RHINITIS CAUSE UNSPECIFIED 06/14/2009   ESOPHAGEAL REFLUX 06/14/2009   Irritable bowel syndrome 06/14/2009   INSOMNIA UNSPECIFIED 06/14/2009   Osteoporosis 08/27/2007    PCP: Seward Carol, MD  REFERRING PROVIDER: Wilber Bihari, NP  REFERRING DIAG: Rt UE lymphedema  THERAPY DIAG:  Lymphedema, not elsewhere classified  Malignant neoplasm of upper-outer quadrant of right breast in female, estrogen receptor positive ([place])  ONSET DATE: 12/30/19  Rationale for Evaluation and Treatment Rehabilitation  SUBJECTIVE                                                                                                                                                                                           SUBJECTIVE STATEMENT: This move is just a lot.  I am very busy.    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 She was treated with neoadjuvant chemotherapy and had a right and left lumpectomy with right axillary lymph node dissection on 12/05/15, with 15 out of 17 lymph nodes being positive. Radiation completed. She has a history of fibromyalgia and osteoporosis. They kept thinking that I had cellulitis in the arm starting 2 years ago and had been off and on antibiotics.  I have a compression sleeve.  CT and doppler negative.    PAIN:  Are you having pain?  No  PRECAUTIONS:  Rt lymphedema  WEIGHT BEARING RESTRICTIONS No  FALLS:  Has patient fallen in last 6 months? No  LIVING ENVIRONMENT: Lives with: lives alone with caregiver 24/7 - will be going to [place] next month  Lives in: House/apartment  OCCUPATION: Retired   LEISURE: not discussed  HAND DOMINANCE : right   PRIOR LEVEL OF FUNCTION: uses care aide 24/7  PATIENT GOALS what to do for the swelling   OBJECTIVE  PALPATION: Non pitting  OBSERVATIONS / OTHER ASSESSMENTS: Rt UE significantly larger  POSTURE: rounded shoulders    LYMPHEDEMA ASSESSMENTS:  [facility] RIGHT  eval 02/12/22 02/14/22 02/19/22 02/21/22 02/23/22 02/26/22 03/02/22 03/12/22  axilla 33 33  31 31.9 32     15cm 33.5 33.5 33 32 33 32 32    10 cm proximal to olecranon process 35 34 33 32 32 30.5 31.5 31.4 31.2  Olecranon process 28.5 28.5 27 27.2 27.1 26.2 27.5 26 27.3  15cm proximal  26 26.2 22.9 23.5 23._0 cm proximal to ulnar styloid process 22.2 23 20.3 20.7 21.4 20 20.3 21 21.9  Just proximal to ulnar styloid process 15 15 15.7 15.0 15 14.8 14.7 15.5 16  Across hand at thumb web space 18.3 18.3 17.1 17._1 17.5 18 17.3  At base of 2nd digit 7.4 6.2 5.9 5.9 5.9 5.5 5.5 6.1 6.1  (Blank rows = not tested) Length of 10 2021m  [facility] LEFT  eval  axilla 31  15cm 30  10 cm proximal to olecranon process 28.8  Olecranon process 24.5  15cm 20.8  10 cm proximal to ulnar styloid process 18.2  Just proximal to ulnar styloid process 14  Across hand at thumb web space 17  At base of 2nd digit 5.8  (Blank rows = not tested)   16729m LLIS:  10  TODAY'S TREATMENT  03/12/2022 Remeasured UE. Assessed fit of velcro and reviewed self donning - had pt adjust placement so the elbow was more covered.  Also practiced donning flat knit with butler which pt is now doing very well.  Pt will be ind with self MlD and garments and return as she gets her new sleeve for fit check.    03/07/2022 Removed sleeve. Noted pt to have red marks at right elbow that is slighly raised and itchy.  Possibly could be from skin folding under her velcro compression nighttime garment applying pressure against her distal humerus/olecranon.  Pt made aware of skin condidtion with mirror. Gave her tg soft to apply under velcro making sure that she has no folds of tg soft of skin folds under velcro,Also gave pt a piece of thin gray foam to pad elbow if needed. She will ask her caregiver RiVelva Harmano check her application of the velcro garment to make sure there is no skin wrinkles or pressure on the elbow.  Manual lymph drainage in supine as follows: short neck, left axillary nodes, right inguinal nodes, superficial and deep abdominals; anterior inter-axillary anastamoses, right axillo-inguinal anastamoses. Right upper extremity from fingers and dorsal hand to lateral shoulder redirecting along pathways Pt measured for her custom sleeve and gauntlet. Applied garement with use of Medi BuMelina CopaAsked pt bring in her velcro garment   03/02/22: Removed sleeve and re measured circumferences. Her circumferences are up especially at her hand. The night time garment should arrive today. Manual lymph drainage in supine as follows: short neck, left axillary nodes, right inguinal nodes, superficial and deep abdominals; anterior inter-axillary  anastamoses, right axillo-inguinal anastamoses. Right upper extremity from fingers and dorsal hand to lateral shoulder redirecting along pathways. Donned sleeve at end of session.   PATIENT EDUCATION:  Education details: per today's visit Person educated: Patient Education method: Customer service manager Education comprehension: verbalized understanding   HOME EXERCISE PROGRAM: None  GARMENTS: ordering OTS size 2 long medi mondi and size 2 medi harmony gauntlet to have ready, would like to wait on night garment, and would like to get a custom made - Okay with self pay for  all.  Pt ordering L&R ready wrap for her arm and gauntlet from compression guru.   ASSESSMENT:  CLINICAL IMPRESSION: Pt seems to be doing well with her lymphedema management and is just awaiting her custom flat knit.     OBJECTIVE IMPAIRMENTS decreased knowledge of condition, decreased knowledge of use of DME, and increased edema.   ACTIVITY LIMITATIONS lifting  PARTICIPATION LIMITATIONS: meal prep, cleaning, laundry, and community activity  PERSONAL FACTORS Age, Time since onset of injury/illness/exacerbation, and 1-2 comorbidities: sugical and radiation hx  are also affecting patient's functional outcome.   REHAB POTENTIAL: Good  CLINICAL DECISION MAKING: Stable/uncomplicated  EVALUATION COMPLEXITY: Low  GOALS: Goals reviewed with patient? Yes   LONG TERM GOALS: Target date: 03/14/2022    Pt will be educated on lymphedema and CDT principles Baseline:  Goal status: MET  2.  Pt will decrease UE volume by at least 146ml to decrease risk of infection Baseline:  Goal status: MET  3.  Pt will decrease LLIS by 4 points to demonstrate improved status Baseline:  Goal status: MET  4.  Pt will obtain compression for the UE Baseline:  Goal status:MET    PLAN: PT FREQUENCY: 3x/week  PT DURATION: 6 weeks  PLANNED INTERVENTIONS: Therapeutic exercises, Therapeutic activity, Patient/Family education, Self Care, DME instructions, Manual therapy, and Re-evaluation  PLAN FOR NEXT SESSION:  Final DC visit?    Stark Bray, PT 03/13/2022, 8:50 AM  PHYSICAL THERAPY DISCHARGE SUMMARY  Visits from Start of Care: 12  Current functional level related to goals / functional outcomes: See above   Remaining deficits: Chronic lymphedema   Education / Equipment: Final garments and self care instruction  Plan: Patient agrees to discharge.  Patient is being discharged due to meeting the stated rehab goals.     Addendum 07/25/22:  Pt would like to order a new velcro night  garment and we will use insurance now that medicare is paying for garments.  Verified that patient would like to order a L&R readywrap size medium regular and size small gauntlet.  This will work for her night garment and for any reduction as needed to continue to wear her day time garment.  She has had success with this garment so far.  Shan Levans, PT, CLT

## 2022-03-14 ENCOUNTER — Encounter: Payer: Medicare Other | Admitting: Rehabilitation

## 2022-03-16 ENCOUNTER — Encounter: Payer: Medicare Other | Admitting: Rehabilitation

## 2022-03-19 ENCOUNTER — Encounter: Payer: Medicare Other | Admitting: Rehabilitation

## 2022-03-21 ENCOUNTER — Encounter: Payer: Medicare Other | Admitting: Rehabilitation

## 2022-03-28 ENCOUNTER — Telehealth: Payer: Self-pay | Admitting: Orthopedic Surgery

## 2022-03-28 NOTE — Telephone Encounter (Signed)
Pt states she was on prednisolone in march and that she need another Rx called in she is in pain. Can she get a refill. Please advise..[phone]

## 2022-03-28 NOTE — Telephone Encounter (Signed)
This pt has not been in the office since 07/2021 and would need appt for eval. Can you please call to sch with either Erin or Dr. Sharol Given at the first available please?

## 2022-03-31 ENCOUNTER — Other Ambulatory Visit: Payer: Self-pay | Admitting: Interventional Cardiology

## 2022-04-09 ENCOUNTER — Encounter: Payer: Self-pay | Admitting: Orthopedic Surgery

## 2022-04-09 ENCOUNTER — Ambulatory Visit (INDEPENDENT_AMBULATORY_CARE_PROVIDER_SITE_OTHER): Payer: Medicare Other | Admitting: Orthopedic Surgery

## 2022-04-09 DIAGNOSIS — M5416 Radiculopathy, lumbar region: Secondary | ICD-10-CM | POA: Diagnosis not present

## 2022-04-09 DIAGNOSIS — M797 Fibromyalgia: Secondary | ICD-10-CM | POA: Diagnosis not present

## 2022-04-09 DIAGNOSIS — M419 Scoliosis, unspecified: Secondary | ICD-10-CM | POA: Diagnosis not present

## 2022-04-09 DIAGNOSIS — G894 Chronic pain syndrome: Secondary | ICD-10-CM

## 2022-04-09 MED ORDER — PREDNISONE 10 MG PO TABS: 20.0000 mg | ORAL_TABLET | Freq: Every day | ORAL | 0 refills | Status: DC

## 2022-04-09 NOTE — Progress Notes (Signed)
Office Visit Note   Patient: [name]           Date of Birth: [birth year]           MRN: [id number] Visit Date: 04/09/2022              Requested by: Seward Carol, MD [address] [place],  [place] [address] PCP: Seward Carol, MD  Chief Complaint  Patient presents with   Right Shoulder - Pain   Left Shoulder - Pain      HPI: Patient is a 76 year old woman who presents with multiple joints with arthritis.  She states that her whole body is in so much pain with chronic back neck and leg pain.  She complains that her legs are weak and has difficulty getting from a sitting to a standing position.  Patient has a history of arthritis fibromyalgia and sciatica.  Patient states she is currently in independent living at [facility].  Assessment & Plan: Visit Diagnoses:  1. Fibromyalgia   2. Lumbar radiculopathy   3. Scoliosis of lumbar spine, unspecified scoliosis type   4. Chronic pain syndrome     Plan: A prescription is called into [facility] in [place] for prednisone to take 20 mg with breakfast and wean down to 10 mg and then wean off as she feels comfortable.  Follow-Up Instructions: Return if symptoms worsen or fail to improve.   Ortho Exam  Patient is alert, oriented, no adenopathy, well-dressed, normal affect, normal respiratory effort. Examination patient ambulates with a kyphotic back.  She has pain getting from a sitting to a standing position and both knees.  Patient has no focal motor weakness.  Imaging: No results found. No images are attached to the encounter.  Labs: Lab Results  Component Value Date   HGBA1C 5.4 11/04/2017   HGBA1C 5.2 05/29/2017   HGBA1C 4.9 01/03/2017   REPTSTATUS 01/05/2017 FINAL 01/03/2017   CULT (A) 01/03/2017    40,000 COLONIES/mL ESCHERICHIA COLI Confirmed Extended Spectrum Beta-Lactamase Producer (ESBL) 80,000 COLONIES/mL ENTEROCOCCUS FAECALIS    LABORGA ESCHERICHIA COLI (A) 01/03/2017   LABORGA  ENTEROCOCCUS FAECALIS (A) 01/03/2017     Lab Results  Component Value Date   ALBUMIN 4.0 01/04/2022   ALBUMIN 4.3 07/11/2021   ALBUMIN 3.9 01/11/2021    No results found for: "MG" Lab Results  Component Value Date   VD25OH 34 03/21/2015   VD25OH 30 03/18/2014   VD25OH 58 02/06/2013    No results found for: "PREALBUMIN"    Latest Ref Rng & Units 01/04/2022   10:06 AM 07/11/2021    2:28 PM 01/11/2021    2:34 PM  CBC EXTENDED  WBC 4.0 - 10.5 K/uL 7.2  7.6  7.9   RBC 3.87 - 5.11 MIL/uL 4.26  4.54  4.38   Hemoglobin 12.0 - 15.0 g/dL 13.7  14.3  13.9   HCT 36.0 - 46.0 % 40.1  42.9  41.3   Platelets 150 - 400 K/uL 252  282  255   NEUT# 1.7 - 7.7 K/uL 4.4  5.5  5.6   Lymph# 0.7 - 4.0 K/uL 1.9  1.4  1.7      There is no height or weight on file to calculate BMI.  Orders:  No orders of the defined types were placed in this encounter.  No orders of the defined types were placed in this encounter.    Procedures: No procedures performed  Clinical Data: No additional findings.  ROS:  All other systems negative, except as noted in the HPI. Review of Systems  Objective: Vital Signs: LMP 04/30/1996 (Approximate)   Specialty Comments:  MRI LUMBAR SPINE WITHOUT CONTRAST     TECHNIQUE:  Multiplanar, multisequence MR imaging of the lumbar spine was  performed. No intravenous contrast was administered.     COMPARISON:  01/29/2014     FINDINGS:  Segmentation:  5 lumbar type vertebrae     Alignment: Levoscoliosis. Grade 1 anterolisthesis at L4-5. Mild  retrolisthesis at L1-2 and L2-3. Misalignment has progressed from  2015.     Vertebrae:  No fracture, evidence of discitis, or bone lesion.     Conus medullaris and cauda equina: Conus extends to the L1-2 level.  Conus and cauda equina appear normal. 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     Disc levels:     T12- L1: Disc narrowing and  ventral spurring.  No impingement     L1-L2: Rightward disc collapse with far-lateral bulging and ridging.  Asymmetric right facet spurring. Moderate right foraminal narrowing.  Noncompressive right subarticular recess stenosis.     L2-L3: Disc narrowing and bulging with retrolisthesis. Mild facet  spurring. Mild to moderate left foraminal narrowing.     L3-L4: Disc narrowing and left eccentric bulging. Degenerative facet  spurring asymmetric to the left. Mild left subarticular recess  stenosis.     L4-L5: Facet osteoarthritis with spurring and anterolisthesis.  Moderate disc space narrowing with mild bulging. Bilateral root  sleeve cysts. Mild to moderate spinal stenosis with asymmetric left  subarticular recess narrowing     L5-S1:Disc narrowing and minor bulging. Mild facet spurring. No  neural compression.     IMPRESSION:  1. 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  2. Mild to moderate foraminal narrowing on the right at L1-2 and  left at L2-3.  3. Mild to moderate spinal stenosis at L4-5 with asymmetric left  subarticular recess narrowing.        Electronically Signed    By: Monte Fantasia M.D.    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   Status post coronary artery stent placement    Non-ST elevation (NSTEMI) myocardial infarction [facility])    Depression with anxiety 12/31/2016   Chest pain 12/31/2016   Hyponatremia 12/31/2016   Chronic pain 12/31/2016    Essential hypertension 12/31/2016   Unstable angina ([place])    Family history of breast cancer in female 02/08/2016   Rash and nonspecific skin eruption 10/03/2015   Sensorineural hearing loss (SNHL) of both ears 09/05/2015   Malignant neoplasm of upper-outer quadrant of right breast in female, estrogen receptor positive ([place]) 09/01/2015   Sclerosing adenosis of left breast 09/01/2015   Asthma 09/12/2011   Chronic vaginitis    Fibromyalgia    Interstitial cystitis    UNSPECIFIED HYPOTHYROIDISM 06/14/2009   DEPRESSIVE DISORDER NOT ELSEWHERE CLASSIFIED 06/14/2009   Hereditary and idiopathic peripheral neuropathy 06/14/2009  UNSPEC HEMORRHOIDS WITHOUT MENTION COMPLICATION 16/01/9603   ALLERGIC RHINITIS CAUSE UNSPECIFIED 06/14/2009   ESOPHAGEAL REFLUX 06/14/2009   Irritable bowel syndrome 06/14/2009   INSOMNIA UNSPECIFIED 06/14/2009   Osteoporosis 08/27/2007   Past Medical History:  Diagnosis Date   Anxiety    Arthritis 2008   Asthma 1953   age 17   Breast cancer ([place]) dx'd 2017   right   Cancer ([place])    breast cancer   Chronic vaginitis 1998   Fibromyalgia 1982   GERD (gastroesophageal reflux disease)    since at least 2011   Hemorrhoids 2009   History of radiation therapy 01/18/16-03/05/16   right breast/nodal areas 45 Gy, right breast boost 16 Gy   Hypoglycemia 2003   Hypothyroidism 1964   age 84   Interstitial cystitis 1998   Irritable bowel syndrome 06/14/2009   Qualifier: Diagnosis of  By: Patsy Baltimore RN, Langley Gauss     Menopausal symptoms    Osteoporosis 12/24/2006   Personal history of chemotherapy    Personal history of radiation therapy    Pneumonia 1979   varicella   PONV (postoperative nausea and vomiting) 1979    Family History  Problem Relation Age of Onset   Hypertension Mother    Heart disease Mother        d. 28   Parkinsonism Father        d. 64   Heart disease Sister    Breast cancer Sister 56       s/p mastectomy; reportedly negative BRCA1/2 testing  approx 7 years ago   Melanoma Sister        d. 70s; hx of incresed sun exposure   Cancer Sister        dx. sarcoma of her face in her 66s; treated w/ MOHS surgery   Parkinsonism Paternal Aunt        d. 90s   Parkinsonism Paternal Uncle        d. late 15s   Kidney failure Maternal Grandmother 46   Heart attack Maternal Grandfather        d. 38s   Heart disease Paternal Grandmother        d. 10   Breast cancer Other 61       maternal great aunt (MGF's sister)   Kidney cancer Paternal Uncle 9       d. 16s; unsure about smoking status    Past Surgical History:  Procedure Laterality Date   BLADDER HYPEREXTENSION     X4, prior to 01/2006   BLADDER SUSPENSION     BREAST BIOPSY     BREAST LUMPECTOMY Right    2017   BREAST LUMPECTOMY WITH NEEDLE LOCALIZATION AND AXILLARY LYMPH NODE DISSECTION Right 12/05/2015   Procedure: RIGHT BREAST NEEDLE LOCALIZED (WIRES X 2) LUMPECTOMY WITH AXILLARY LYMPH NODE DISSECTION;  Surgeon: Fanny Skates, MD;  Location: [place];  Service: General;  Laterality: Right;   BREAST LUMPECTOMY WITH RADIOACTIVE SEED LOCALIZATION Left 12/05/2015   Procedure: RADIOACTIVE SEED GUIDED LEFT BREAST LUMPECTOMY;  Surgeon: Fanny Skates, MD;  Location: [place];  Service: General;  Laterality: Left;   BREAST SURGERY     BUNIONECTOMY     prior to 01/2006   [place]  02/08/2006   DR. INGRAM   COLONOSCOPY     CORONARY STENT INTERVENTION N/A 01/01/2017   Procedure: CORONARY STENT INTERVENTION;  Surgeon: Jettie Booze, MD;  Location: [place] CV LAB;  Service: Cardiovascular;  Laterality: N/A;   DENTAL SURGERY  2009-2016   13 dental implants   LEFT HEART CATH AND CORONARY ANGIOGRAPHY N/A 01/01/2017   Procedure: LEFT HEART CATH AND CORONARY ANGIOGRAPHY;  Surgeon: Jettie Booze, MD;  Location: [place] CV LAB;  Service: Cardiovascular;  Laterality: N/A;   LEG SURGERY  07/06/2004   Broken tibia   NASAL  SINUS SURGERY     X4; prior to 01/2006   [facility] REMOVAL Right 12/05/2015   Procedure: REMOVAL PORT-A-CATH;  Surgeon: Fanny Skates, MD;  Location: [place];  Service: General;  Laterality: Right;   PORTACATH PLACEMENT Left 09/08/2015   Procedure: INSERTION OF PORT-A-CATH ;  Surgeon: Fanny Skates, MD;  Location: [place];  Service: General;  Laterality: Left;   Social History   Occupational History   Not on file  Tobacco Use   Smoking status: Former    Packs/day: 0.25    Years: 15.00    Total pack years: 3.75    Types: Cigarettes   Smokeless tobacco: Never   Tobacco comments:    social smoker/"on and off"  Vaping Use   Vaping Use: Never used  Substance and Sexual Activity   Alcohol use: Yes    Alcohol/week: 0.0 standard drinks of alcohol    Comment: social   Drug use: No   Sexual activity: Not Currently    Birth control/protection: Post-menopausal

## 2022-04-10 ENCOUNTER — Other Ambulatory Visit: Payer: Self-pay | Admitting: Adult Health

## 2022-04-10 DIAGNOSIS — Z853 Personal history of malignant neoplasm of breast: Secondary | ICD-10-CM

## 2022-04-11 ENCOUNTER — Ambulatory Visit
Admission: RE | Admit: 2022-04-11 | Discharge: 2022-04-11 | Disposition: A | Discharge status: Home / Self Care | Payer: Medicare Other | Source: Ambulatory Visit | Attending: Diagnostic Radiology | Admitting: Diagnostic Radiology

## 2022-04-11 ENCOUNTER — Other Ambulatory Visit: Payer: Self-pay | Admitting: Adult Health

## 2022-04-11 DIAGNOSIS — Z853 Personal history of malignant neoplasm of breast: Secondary | ICD-10-CM

## 2022-04-11 DIAGNOSIS — R921 Mammographic calcification found on diagnostic imaging of breast: Secondary | ICD-10-CM

## 2022-04-17 ENCOUNTER — Telehealth: Payer: Self-pay | Admitting: Physical Medicine and Rehabilitation

## 2022-04-17 NOTE — Telephone Encounter (Signed)
Patient would like an appt for back injections in January

## 2022-04-18 ENCOUNTER — Other Ambulatory Visit: Payer: Self-pay | Admitting: Physical Medicine and Rehabilitation

## 2022-04-18 ENCOUNTER — Telehealth: Payer: Self-pay | Admitting: Physical Medicine and Rehabilitation

## 2022-04-18 DIAGNOSIS — M5416 Radiculopathy, lumbar region: Secondary | ICD-10-CM

## 2022-04-18 NOTE — Telephone Encounter (Signed)
Pt called requesting a call back to set an appt for injection. Please call pt at [phone]

## 2022-04-18 NOTE — Telephone Encounter (Signed)
See previous encounter

## 2022-04-18 NOTE — Telephone Encounter (Signed)
Spoke with patient and she is requesting another injection. She stated the last injection lasted for about 2 months. No new falls, injury or accidents. Please advise

## 2022-04-27 ENCOUNTER — Other Ambulatory Visit: Payer: Self-pay | Admitting: Interventional Cardiology

## 2022-05-01 NOTE — Progress Notes (Signed)
Cardiology Office Note:    Date:  05/08/2022   ID:  [name], DOB [birth year], MRN [id number]  PCP:  Seward Carol, MD  [place] [facility] Cardiologist:  Larae Grooms, MD     Referring MD: Seward Carol, MD   Chief Complaint:  Follow-up     History of Present Illness:   [name] is a 77 y.o. female with history of CAD status post NSTEMI 01/01/17 treated with DES 4 to the circumflex, mid LAD, mid RCA, and distal RCA.  Angina was more like indigestion, followed by pain in the center of chest, up to the jaw.   Also has HTN, breast CA, fibromyalgia and hypothyroidism.   Low risk NST 10/2020.  Patient last saw Dr. Irish Lack 03/2021 and doing well.  Patient comes in for f/u. Her husband passed away a year ago of dementia. Sister died of dementia as well. 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 She moved to [place] independent living.  Her daughter is an infectious disease MD at [employer].   Patient denies any recent chest pain, dyspnea, palpitations, dizziness or presyncope. Only takes lasix prn. Currently having some lymphedema in right arm. Wearing a sleeve which has helped.       Past Medical History:  Diagnosis Date   Anxiety    Arthritis 2008   Asthma 1953   age 17   Breast cancer ([employer]) dx'd 2017   right   Cancer [facility])    breast cancer   Chronic vaginitis 1998   Fibromyalgia 1982   GERD (gastroesophageal reflux disease)    since at least 2011   Hemorrhoids 2009   History of radiation therapy 01/18/16-03/05/16   right breast/nodal areas 45 Gy, right breast boost 16 Gy   Hypoglycemia 2003   Hypothyroidism 1964   age 41   Interstitial cystitis 1998   Irritable bowel syndrome 06/14/2009   Qualifier: Diagnosis of  By: Patsy Baltimore RN, Langley Gauss     Menopausal symptoms    Osteoporosis 12/24/2006   Personal history of chemotherapy    Personal history of radiation therapy    Pneumonia 1979   varicella   PONV (postoperative nausea and  vomiting) 1979   Current Medications: Current Meds  Medication Sig   albuterol (PROVENTIL HFA;VENTOLIN HFA) 108 (90 Base) MCG/ACT inhaler Inhale 1-2 puffs into the lungs every 6 (six) hours as needed for wheezing or shortness of breath. Started in 1998   anastrozole (ARIMIDEX) 1 MG tablet Take 1 tablet (1 mg total) by mouth daily.   buPROPion (WELLBUTRIN XL) 300 MG 24 hr tablet Take 300 mg by mouth daily.    Calcium Carbonate-Vitamin D (CALCIUM + D PO) Take 1 tablet by mouth at bedtime. Started approximately 1998   carisoprodol (SOMA) 350 MG tablet Take 350 mg by mouth 2 (two) times daily as needed for muscle spasms. Started in 2008.   diazepam (VALIUM) 5 MG tablet Take 5 mg by mouth every 6 (six) hours as needed for muscle spasms (interstitial cystitis). Started approximately 2008.   furosemide (LASIX) 20 MG tablet TAKE 1 TABLET(20 MG) BY MOUTH DAILY AS NEEDED FOR SHORTNESS OF BREATH   gabapentin (NEURONTIN) 300 MG capsule TAKE 3 TO 4 CAPSULES(900 TO 1200 MG) BY MOUTH AT BEDTIME   HYDROcodone-acetaminophen (NORCO) 10-325 MG tablet Take 1 tablet by mouth every 4 (four) hours as needed for moderate pain or severe pain (as needed for fibromyalgia and IC).    hydrOXYzine (ATARAX/VISTARIL) 25 MG tablet Take  1 tablet by mouth at bedtime.   levothyroxine (SYNTHROID, LEVOTHROID) 75 MCG tablet Take 75 mcg by mouth daily.   LORazepam (ATIVAN) 2 MG tablet Take 1 tablet by mouth daily as needed for anxiety.    nitroGLYCERIN (NITROSTAT) 0.4 MG SL tablet FOR CHEST PAIN 1 TABLET UNDER THE TONGUE WAIT 5 MINUTES AND IF STILL HAVING CHEST PAIN REPEAT 2 MORE TIME IF STILL PAIN CALL 911 OR GO TO ER   ondansetron (ZOFRAN) 4 MG tablet Take 1-2 tablets (4-8 mg total) by mouth every 8 (eight) hours as needed for nausea or vomiting.   pantoprazole (PROTONIX) 40 MG tablet Take 40 mg by mouth daily. Started in 2012.   potassium chloride (KLOR-CON) 10 MEQ tablet TAKE 1 TABLET BY MOUTH AS NEEDED. TAKE WITH LASIX    promethazine (PHENERGAN) 25 MG tablet Take 0.5-1 tablets (12.5-25 mg total) by mouth every 6 (six) hours as needed for nausea or vomiting.   rosuvastatin (CRESTOR) 40 MG tablet TAKE 1 TABLET(40 MG) BY MOUTH DAILY   ticagrelor (BRILINTA) 90 MG TABS tablet Take 1 tablet (90 mg total) by mouth 2 (two) times daily. Please keep upcoming appointment for future refills. Thank you.   Vaginal Lubricant (REPLENS VA) Place vaginally daily.   Vitamin D, Ergocalciferol, (DRISDOL) 1.25 MG (50000 UNIT) CAPS capsule Take 1 capsule (50,000 Units total) by mouth every 7 (seven) days.   zolpidem (AMBIEN) 10 MG tablet Take 10 mg by mouth at bedtime as needed for sleep.   [DISCONTINUED] cephALEXin (KEFLEX) 500 MG capsule Take 1 capsule (500 mg total) by mouth 2 (two) times daily.   [DISCONTINUED] doxycycline (VIBRA-TABS) 100 MG tablet Take 1 tablet (100 mg total) by mouth 2 (two) times daily.   [DISCONTINUED] NONFORMULARY OR COMPOUNDED ITEM Vit E vaginal suppositories '200mg'$ /ml.  One pv three times weekly.  #36/4RF   [DISCONTINUED] predniSONE (DELTASONE) 10 MG tablet Take 1 tablet (10 mg total) by mouth daily with breakfast.   [DISCONTINUED] predniSONE (DELTASONE) 10 MG tablet Take 2 tablets (20 mg total) by mouth daily with breakfast.    Allergies:   Chlorhexidine and Other   Social History   Tobacco Use   Smoking status: Former    Packs/day: 0.25    Years: 15.00    Total pack years: 3.75    Types: Cigarettes   Smokeless tobacco: Never   Tobacco comments:    social smoker/"on and off"  Vaping Use   Vaping Use: Never used  Substance Use Topics   Alcohol use: Yes    Alcohol/week: 0.0 standard drinks of alcohol    Comment: social   Drug use: No    Family Hx: The patient's family history includes Breast cancer (age of onset: 23) in her sister; Breast cancer (age of onset: 46) in an other family member; Cancer in her sister; Heart attack in her maternal grandfather; Heart disease in her mother, paternal  grandmother, and sister; Hypertension in her mother; Kidney cancer (age of onset: 18) in her paternal uncle; Kidney failure (age of onset: 41) in her maternal grandmother; Melanoma in her sister; Parkinsonism in her father, paternal aunt, and paternal uncle.  ROS     Physical Exam:    VS:  BP 138/85   Pulse 80   Ht '5\' 2"'$  (1.575 m)   Wt 149 lb (67.6 kg)   LMP 04/30/1996 (Approximate)   SpO2 92%   BMI 27.25 kg/m     Wt Readings from Last 3 Encounters:  05/08/22 149 lb (67.6 kg)  01/29/22 152 lb 1.6 oz (69 kg)  01/04/22 156 lb 11.2 oz (71.1 kg)    Physical Exam  GEN: Well nourished, well developed, in no acute distress  Neck: no JVD, carotid bruits, or masses Cardiac:RRR; no murmurs, rubs, or gallops  Respiratory:  clear to auscultation bilaterally, normal work of breathing GI: soft, nontender, nondistended, + BS Ext: without cyanosis, clubbing, or edema, Good distal pulses bilaterally Neuro:  Alert and Oriented x 3,  Psych: euthymic mood, full affect        EKGs/Labs/Other Test Reviewed:    EKG:  EKG is  ordered today.  The ekg ordered today demonstrates NSR with nonspecific ST changes no acute change  Recent Labs: 01/04/2022: ALT 24; BUN 15; Creatinine 0.79; Hemoglobin 13.7; Platelet Count 252; Potassium 4.1; Sodium 138   Recent Lipid Panel No results for input(s): "CHOL", "TRIG", "HDL", "VLDL", "LDLCALC", "LDLDIRECT" in the last 8760 hours.   Prior CV Studies:     Stress in 2022: "The left ventricular ejection fraction is hyperdynamic (>65%). Nuclear stress EF: 71%. There was no ST segment deviation noted during stress. The study is normal. This is a low risk study.   IMPRESSIONS Negative for stress induced arrhythmias.  Hyperdynamic left ventricular function. No perfusion defects.  There is subdiaphragmatic extra-cardiac activity in the stress and rest images.  Sensitivity is decreased with the presence this finding.   Raw images do not show evidence of  non-cardiac thoracic uptake.   Conclusions: Stress test is negative. This is consistent with a low risk study ."  Risk Assessment/Calculations/Metrics:              ASSESSMENT & PLAN:   No problem-specific Assessment & Plan notes found for this encounter.   CAD NSTEMI 11/2016 DES x 4Cfx, mLAD, mRCA and dRCA, low risk NST 10/2020 on Brilinta, crestor. No angina. No bleeding problems on brilinta. Increase exercise otherwise doing well.   HTN BP up initially but came down on re-check. She'll monitor at home.  Chronic diastolic CHF-prn lasix   HLD LDL 69 on 01/2022 on crestor.            Dispo:  No follow-ups on file.   Medication Adjustments/Labs and Tests Ordered: Current medicines are reviewed at length with the patient today.  Concerns regarding medicines are outlined above.  Tests Ordered: Orders Placed This Encounter  Procedures   EKG 12-Lead   Medication Changes: No orders of the defined types were placed in this encounter.  Signed, Ermalinda Barrios, PA-C  05/08/2022 1:19 PM    [facility] [address], [place], [place]  [address] Phone: [phone]; Fax: [phone]

## 2022-05-03 ENCOUNTER — Encounter: Payer: Self-pay | Admitting: Oncology

## 2022-05-06 ENCOUNTER — Other Ambulatory Visit: Payer: Self-pay | Admitting: Interventional Cardiology

## 2022-05-08 ENCOUNTER — Encounter: Payer: Self-pay | Admitting: Oncology

## 2022-05-08 ENCOUNTER — Encounter: Payer: Self-pay | Admitting: Physician Assistant

## 2022-05-08 ENCOUNTER — Ambulatory Visit: Payer: Medicare Other | Attending: Physician Assistant | Admitting: Physician Assistant

## 2022-05-08 VITALS — BP 138/85 | HR 80 | Ht 62.0 in | Wt 149.0 lb

## 2022-05-08 DIAGNOSIS — I10 Essential (primary) hypertension: Secondary | ICD-10-CM | POA: Diagnosis not present

## 2022-05-08 DIAGNOSIS — E785 Hyperlipidemia, unspecified: Secondary | ICD-10-CM | POA: Insufficient documentation

## 2022-05-08 DIAGNOSIS — I5032 Chronic diastolic (congestive) heart failure: Secondary | ICD-10-CM | POA: Insufficient documentation

## 2022-05-08 DIAGNOSIS — I2510 Atherosclerotic heart disease of native coronary artery without angina pectoris: Secondary | ICD-10-CM | POA: Diagnosis not present

## 2022-05-08 NOTE — Patient Instructions (Signed)
Medication Instructions:  Your physician recommends that you continue on your current medications as directed. Please refer to the Current Medication list given to you today.  *If you need a refill on your cardiac medications before your next appointment, please call your pharmacy*   Lab Work: None ordered If you have labs (blood work) drawn today and your tests are completely normal, you will receive your results only by: [place] (if you have MyChart) OR A paper copy in the mail If you have any lab test that is abnormal or we need to change your treatment, we will call you to review the results.   Follow-Up: At [facility], you and your health needs are our priority.  As part of our continuing mission to provide you with exceptional heart care, we have created designated Provider Care Teams.  These Care Teams include your primary Cardiologist (physician) and Advanced Practice Providers (APPs -  Physician Assistants and Nurse Practitioners) who all work together to provide you with the care you need, when you need it.  Your next appointment:   1 year(s)  The format for your next appointment:   In Person  Provider:   Larae Grooms, MD    Important Information About Sugar

## 2022-05-10 ENCOUNTER — Ambulatory Visit (INDEPENDENT_AMBULATORY_CARE_PROVIDER_SITE_OTHER): Payer: Medicare Other | Admitting: Physical Medicine and Rehabilitation

## 2022-05-10 ENCOUNTER — Ambulatory Visit: Payer: Self-pay

## 2022-05-10 VITALS — BP 160/88 | HR 65

## 2022-05-10 DIAGNOSIS — I2510 Atherosclerotic heart disease of native coronary artery without angina pectoris: Secondary | ICD-10-CM | POA: Diagnosis not present

## 2022-05-10 DIAGNOSIS — G894 Chronic pain syndrome: Secondary | ICD-10-CM

## 2022-05-10 DIAGNOSIS — M5416 Radiculopathy, lumbar region: Secondary | ICD-10-CM | POA: Diagnosis not present

## 2022-05-10 DIAGNOSIS — M48062 Spinal stenosis, lumbar region with neurogenic claudication: Secondary | ICD-10-CM | POA: Diagnosis not present

## 2022-05-10 DIAGNOSIS — M797 Fibromyalgia: Secondary | ICD-10-CM

## 2022-05-10 DIAGNOSIS — M47816 Spondylosis without myelopathy or radiculopathy, lumbar region: Secondary | ICD-10-CM

## 2022-05-10 MED ORDER — METHYLPREDNISOLONE ACETATE 80 MG/ML IJ SUSP
80.0000 mg | Freq: Once | INTRAMUSCULAR | Status: AC
  Administered 2022-05-10: 80 mg

## 2022-05-10 NOTE — Progress Notes (Signed)
Functional Pain Scale - descriptive words and definitions  Unmanageable (7)  Pain interferes with normal ADL's/nothing seems to help/sleep is very difficult/active distractions are very difficult to concentrate on. Severe range order  Average Pain 7, but can get higher at times   +Driver, -BT, -Dye Allergies.  Back pain on both sides that radiates into the legs

## 2022-05-10 NOTE — Patient Instructions (Signed)

## 2022-05-15 DIAGNOSIS — I252 Old myocardial infarction: Secondary | ICD-10-CM | POA: Insufficient documentation

## 2022-05-20 ENCOUNTER — Encounter: Payer: Self-pay | Admitting: Physical Medicine and Rehabilitation

## 2022-05-20 NOTE — Progress Notes (Signed)
[name] - 77 y.o. female MRN [id number]  Date of birth: [birth year]  Office Visit Note: Visit Date: 05/10/2022 PCP: Seward Carol, MD Referred by: Seward Carol, MD  Subjective: Chief Complaint  Patient presents with   Lower Back - Pain   HPI: [name] is a 77 y.o. female who comes in today for evaluation and management chronic and worsening and recalcitrant low back pain with bilateral hip and leg pain which is mostly posterior lateral with some paresthesia.  She reports 7 out of 10 unmanageable pain which interferes with her normal daily activities and is very distractive and disruptive to her sleep and daily functioning.  This would coincide with the severe range order.  This is despite the fact that she is taking oxycodone 10 mg 4 times a day prescribed by her PCP Seward Carol, MD and Rochele Raring, MD. she also receives lorazepam 1 mg once daily from them.  The PDMP was checked today.  She also receives from her rheumatologist Leigh Aurora, MD Ambien 10 mg daily as well as Soma 350 mg 4 times per day.  [name] is a medication fairly frowned upon in the pain management oral.  She appears to be fairly steady on these medications although she was taking hydrocodone and now oxycodone.  Her case is complicated by centralized central sensitization ablation pain syndrome and fibromyalgia as well as a diagnosis of neuropathy although it is very unclear how this is treated.  Also complicated by death of her husband last year.  She is now moved into assisted living at [facility].  She has not noted any new injury or falls.  She does try to maintain activity.  She reports injections do help temporarily.  She gets anywhere from 2 to 6 months of relief although lately they seem to be helping a little bit less than in the past.  Prior injection was 3 months ago.      Review of Systems  Musculoskeletal:  Positive for back pain and joint pain.  Neurological:  Positive for tingling.  All other  systems reviewed and are negative.  Otherwise per HPI.  Assessment & Plan: Visit Diagnoses:    ICD-10-CM   1. Lumbar radiculopathy  M54.16 XR C-ARM NO REPORT    Epidural Steroid injection    methylPREDNISolone acetate (DEPO-MEDROL) injection 80 mg    2. Spinal stenosis of lumbar region with neurogenic claudication  M48.062     3. Spondylosis without myelopathy or radiculopathy, lumbar region  M47.816     4. Fibromyalgia  M79.7     5. Chronic pain syndrome  G89.4        Plan: Findings:  Chronic worsening back pain with radicular type leg pain in the setting of slowly progressive lumbar spondylosis with mild retrolisthesis and main finding is mild to moderate stenosis with left more than right lateral recess stenosis at L4-5.  Facet joint arthritis noted.  Last MRI in 2022.  Does get relief with injection temporarily but is on significant amount of pain medication with benzodiazepine type medication as well as Soma and Ambien.  This combination is monitored by her primary care physician as well as her rheumatologist.  We do not have any facility here to manage this type of medication regimen and if she is stable on this regimen I feel like she can continue safely as long as they are watching her.  There may be some level of integration into a comprehensive pain management facility if needed.  We did complete the injection today at the L5 levels bilaterally.  We talked about exercises and physical therapy particularly now that she is at [facility].    Meds & Orders:  Meds ordered this encounter  Medications   methylPREDNISolone acetate (DEPO-MEDROL) injection 80 mg    Orders Placed This Encounter  Procedures   XR C-ARM NO REPORT   Epidural Steroid injection    Follow-up: Return for visit to requesting provider as needed.   Procedures: No procedures performed  Lumbosacral Transforaminal Epidural Steroid Injection - Sub-Pedicular Approach with Fluoroscopic Guidance  Patient: [name]      Date of Birth: 04-03-1946 MRN: [id number] PCP: Seward Carol, MD      Visit Date: 05/10/2022   Universal Protocol:    Date/Time: 05/10/2022  Consent Given By: the patient  Position: PRONE  Additional Comments: Vital signs were monitored before and after the procedure. Patient was prepped and draped in the usual sterile fashion. The correct patient, procedure, and site was verified.   Injection Procedure Details:   Procedure diagnoses: Lumbar radiculopathy [M54.16]    Meds Administered:  Meds ordered this encounter  Medications   methylPREDNISolone acetate (DEPO-MEDROL) injection 80 mg    Laterality: Bilateral  Location/Site: L5  Needle:5.0 in., 22 ga.  Short bevel or Quincke spinal needle  Needle Placement: Transforaminal  Findings:    -Comments: Excellent flow of contrast along the nerve, nerve root and into the epidural space.  Procedure Details: After squaring off the end-plates to get a true AP view, the C-arm was positioned so that an oblique view of the foramen as noted above was visualized. The target area is just inferior to the "nose of the scotty dog" or sub pedicular. The soft tissues overlying this structure were infiltrated with 2-3 ml. of 1% Lidocaine without Epinephrine.  The spinal needle was inserted toward the target using a "trajectory" view along the fluoroscope beam.  Under AP and lateral visualization, the needle was advanced so it did not puncture dura and was located close the 6 O'Clock position of the pedical in AP tracterory. Biplanar projections were used to confirm position. Aspiration was confirmed to be negative for CSF and/or blood. A 1-2 ml. volume of Isovue-250 was injected and flow of contrast was noted at each level. Radiographs were obtained for documentation purposes.   After attaining the desired flow of contrast documented above, a 0.5 to 1.0 ml test dose of 0.25% Marcaine was injected into each respective transforaminal  space.  The patient was observed for 90 seconds post injection.  After no sensory deficits were reported, and normal lower extremity motor function was noted,   the above injectate was administered so that equal amounts of the injectate were placed at each foramen (level) into the transforaminal epidural space.   Additional Comments:  No complications occurred Dressing: 2 x 2 sterile gauze and Band-Aid    Post-procedure details: Patient was observed during the procedure. Post-procedure instructions were reviewed.  Patient left the clinic in stable condition.    Clinical History: MRI LUMBAR SPINE WITHOUT CONTRAST     TECHNIQUE:  Multiplanar, multisequence MR imaging of the lumbar spine was  performed. No intravenous contrast was administered.     COMPARISON:  01/29/2014     FINDINGS:  Segmentation:  5 lumbar type vertebrae     Alignment: Levoscoliosis. Grade 1 anterolisthesis at L4-5. Mild  retrolisthesis at L1-2 and L2-3. Misalignment has progressed from  2015.     Vertebrae:  No  fracture, evidence of discitis, or bone lesion.     Conus medullaris and cauda equina: Conus extends to the L1-2 level.  Conus and cauda equina appear normal. 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     Disc levels:     T12- L1: Disc narrowing and ventral spurring.  No impingement     L1-L2: Rightward disc collapse with far-lateral bulging and ridging.  Asymmetric right facet spurring. Moderate right foraminal narrowing.  Noncompressive right subarticular recess stenosis.     L2-L3: Disc narrowing and bulging with retrolisthesis. Mild facet  spurring. Mild to moderate left foraminal narrowing.     L3-L4: Disc narrowing and left eccentric bulging. Degenerative facet  spurring asymmetric to the left. Mild left subarticular recess  stenosis.     L4-L5: Facet osteoarthritis with spurring and anterolisthesis.  Moderate  disc space narrowing with mild bulging. Bilateral root  sleeve cysts. Mild to moderate spinal stenosis with asymmetric left  subarticular recess narrowing     L5-S1:Disc narrowing and minor bulging. Mild facet spurring. No  neural compression.     IMPRESSION:  1. 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  2. Mild to moderate foraminal narrowing on the right at L1-2 and  left at L2-3.  3. Mild to moderate spinal stenosis at L4-5 with asymmetric left  subarticular recess narrowing.        Electronically Signed    By: Monte Fantasia M.D.    On: 05/08/2020 07:15   She reports that she has quit smoking. Her smoking use included cigarettes. She has a 3.75 pack-year smoking history. She has never used smokeless tobacco. No results for input(s): "HGBA1C", "LABURIC" in the last 8760 hours.  Objective:  VS:  HT:    WT:   BMI:     BP:(!) 160/88  HR:65bpm  TEMP: ( )  RESP:  Physical Exam Vitals and nursing note reviewed.  Constitutional:      General: She is not in acute distress.    Appearance: Normal appearance. She is well-developed. She is not ill-appearing.  HENT:     Head: Normocephalic and atraumatic.     Right Ear: External ear normal.     Left Ear: External ear normal.     Nose: Nose normal.     Mouth/Throat:     Mouth: Mucous membranes are moist.     Pharynx: Oropharynx is clear.  Eyes:     Extraocular Movements: Extraocular movements intact.     Conjunctiva/sclera: Conjunctivae normal.     Pupils: Pupils are equal, round, and reactive to light.  Cardiovascular:     Rate and Rhythm: Normal rate and regular rhythm.     Pulses: Normal pulses.  Pulmonary:     Effort: Pulmonary effort is normal. No respiratory distress.  Abdominal:     General: There is no distension.     Palpations: Abdomen is soft.     Tenderness: There is no guarding.  Musculoskeletal:        General: Tenderness present.     Cervical  back: Normal range of motion and neck supple.     Right lower leg: No edema.     Left lower leg: No edema.     Comments: Patient has good distal strength with no pain over the greater trochanters.  No clonus or focal weakness.  Skin:  General: Skin is warm and dry.     Findings: No erythema, lesion or rash.  Neurological:     General: No focal deficit present.     Mental Status: She is alert and oriented to person, place, and time.     Sensory: No sensory deficit.     Motor: No weakness or abnormal muscle tone.     Coordination: Coordination normal.     Gait: Gait normal.  Psychiatric:        Mood and Affect: Mood normal.        Behavior: Behavior normal.        Thought Content: Thought content normal.     Ortho Exam  Imaging: No results found.  Past Medical/Family/Surgical/Social History: Medications & Allergies reviewed per EMR, new medications updated. 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   Status post coronary artery stent placement    Non-ST elevation (NSTEMI) myocardial infarction [facility])    Depression with anxiety 12/31/2016   Chest pain 12/31/2016   Hyponatremia 12/31/2016   Chronic pain 12/31/2016   Essential hypertension 12/31/2016   Unstable angina ([place])    Family history of breast cancer in female 02/08/2016   Rash and nonspecific skin eruption 10/03/2015   Sensorineural hearing loss (SNHL) of both ears 09/05/2015   Malignant neoplasm of upper-outer quadrant of  right breast in female, estrogen receptor positive ([place]) 09/01/2015   Sclerosing adenosis of left breast 09/01/2015   Asthma 09/12/2011   Chronic vaginitis    Fibromyalgia    Interstitial cystitis    UNSPECIFIED HYPOTHYROIDISM 06/14/2009   DEPRESSIVE DISORDER NOT ELSEWHERE CLASSIFIED 06/14/2009   Hereditary and idiopathic peripheral neuropathy 06/14/2009   UNSPEC HEMORRHOIDS WITHOUT MENTION COMPLICATION 22/97/9892   ALLERGIC RHINITIS CAUSE UNSPECIFIED 06/14/2009   ESOPHAGEAL REFLUX 06/14/2009   Irritable bowel syndrome 06/14/2009   INSOMNIA UNSPECIFIED 06/14/2009   Osteoporosis 08/27/2007   Past Medical History:  Diagnosis Date   Anxiety    Arthritis 2008   Asthma 1953   age 61   Breast cancer ([place]) dx'd 2017   right   Cancer ([place])    breast cancer   Chronic vaginitis 1998   Fibromyalgia 1982   GERD (gastroesophageal reflux disease)    since at least 2011   Hemorrhoids 2009   History of radiation therapy 01/18/16-03/05/16   right breast/nodal areas 45 Gy, right breast boost 16 Gy   Hypoglycemia 2003   Hypothyroidism 1964   age 25   Interstitial cystitis 1998   Irritable bowel syndrome 06/14/2009   Qualifier: Diagnosis of  By: Patsy Baltimore RN, Langley Gauss     Menopausal symptoms    Osteoporosis 12/24/2006   Personal history of chemotherapy    Personal history of radiation therapy    Pneumonia 1979   varicella   PONV (postoperative nausea and vomiting) 1979   Family History  Problem Relation Age of Onset   Hypertension Mother    Heart disease Mother        d. 21   Parkinsonism Father        d.  65   Heart disease Sister    Breast cancer Sister 45       s/p mastectomy; reportedly negative BRCA1/2 testing approx 7 years ago   Melanoma Sister        d. 48s; hx of incresed sun exposure   Cancer Sister        dx. sarcoma of her face in her 103s; treated w/ MOHS surgery   Parkinsonism Paternal Aunt        d. 90s   Parkinsonism Paternal Uncle        d. late 75s   Kidney  failure Maternal Grandmother 46   Heart attack Maternal Grandfather        d. 67s   Heart disease Paternal Grandmother        d. 72   Breast cancer Other 67       maternal great aunt (MGF's sister)   Kidney cancer Paternal Uncle 13       d. 22s; unsure about smoking status   Past Surgical History:  Procedure Laterality Date   BLADDER HYPEREXTENSION     X4, prior to 01/2006   BLADDER SUSPENSION     BREAST BIOPSY     BREAST LUMPECTOMY Right    2017   BREAST LUMPECTOMY WITH NEEDLE LOCALIZATION AND AXILLARY LYMPH NODE DISSECTION Right 12/05/2015   Procedure: RIGHT BREAST NEEDLE LOCALIZED (WIRES X 2) LUMPECTOMY WITH AXILLARY LYMPH NODE DISSECTION;  Surgeon: Fanny Skates, MD;  Location: [place];  Service: General;  Laterality: Right;   BREAST LUMPECTOMY WITH RADIOACTIVE SEED LOCALIZATION Left 12/05/2015   Procedure: RADIOACTIVE SEED GUIDED LEFT BREAST LUMPECTOMY;  Surgeon: Fanny Skates, MD;  Location: [place];  Service: General;  Laterality: Left;   BREAST SURGERY     BUNIONECTOMY     prior to 01/2006   [place]  02/08/2006   DR. INGRAM   COLONOSCOPY     CORONARY STENT INTERVENTION N/A 01/01/2017   Procedure: CORONARY STENT INTERVENTION;  Surgeon: Jettie Booze, MD;  Location: [place] [facility];  Service: Cardiovascular;  Laterality: N/A;   DENTAL SURGERY  2009-2016   13 dental implants   LEFT HEART CATH AND CORONARY ANGIOGRAPHY N/A 01/01/2017   Procedure: LEFT HEART CATH AND CORONARY ANGIOGRAPHY;  Surgeon: Jettie Booze, MD;  Location: [place] [facility];  Service: Cardiovascular;  Laterality: N/A;   LEG SURGERY  07/06/2004   Broken tibia   NASAL SINUS SURGERY     X4; prior to 01/2006   [facility] REMOVAL Right 12/05/2015   Procedure: REMOVAL PORT-A-CATH;  Surgeon: Fanny Skates, MD;  Location: [place];  Service: General;  Laterality: Right;   PORTACATH PLACEMENT Left 09/08/2015   Procedure: INSERTION OF PORT-A-CATH  ;  Surgeon: Fanny Skates, MD;  Location: [place];  Service: General;  Laterality: Left;   Social History   Occupational History   Not on file  Tobacco Use   Smoking status: Former    Packs/day: 0.25    Years: 15.00    Total pack years: 3.75    Types: Cigarettes   Smokeless tobacco: Never   Tobacco comments:    social smoker/"on and off"  Vaping Use   Vaping Use: Never used  Substance and Sexual Activity   Alcohol use: Yes    Alcohol/week: 0.0 standard drinks of alcohol    Comment: social   Drug use: No   Sexual activity: Not Currently    Birth  control/protection: Post-menopausal

## 2022-05-20 NOTE — Procedures (Signed)
Lumbosacral Transforaminal Epidural Steroid Injection - Sub-Pedicular Approach with Fluoroscopic Guidance  Patient: [name]      Date of Birth: 1945-08-04 MRN: [id number] PCP: Seward Carol, MD      Visit Date: 05/10/2022   Universal Protocol:    Date/Time: 05/10/2022  Consent Given By: the patient  Position: PRONE  Additional Comments: Vital signs were monitored before and after the procedure. Patient was prepped and draped in the usual sterile fashion. The correct patient, procedure, and site was verified.   Injection Procedure Details:   Procedure diagnoses: Lumbar radiculopathy [M54.16]    Meds Administered:  Meds ordered this encounter  Medications   methylPREDNISolone acetate (DEPO-MEDROL) injection 80 mg    Laterality: Bilateral  Location/Site: L5  Needle:5.0 in., 22 ga.  Short bevel or Quincke spinal needle  Needle Placement: Transforaminal  Findings:    -Comments: Excellent flow of contrast along the nerve, nerve root and into the epidural space.  Procedure Details: After squaring off the end-plates to get a true AP view, the C-arm was positioned so that an oblique view of the foramen as noted above was visualized. The target area is just inferior to the "nose of the scotty dog" or sub pedicular. The soft tissues overlying this structure were infiltrated with 2-3 ml. of 1% Lidocaine without Epinephrine.  The spinal needle was inserted toward the target using a "trajectory" view along the fluoroscope beam.  Under AP and lateral visualization, the needle was advanced so it did not puncture dura and was located close the 6 O'Clock position of the pedical in AP tracterory. Biplanar projections were used to confirm position. Aspiration was confirmed to be negative for CSF and/or blood. A 1-2 ml. volume of Isovue-250 was injected and flow of contrast was noted at each level. Radiographs were obtained for documentation purposes.   After attaining the desired  flow of contrast documented above, a 0.5 to 1.0 ml test dose of 0.25% Marcaine was injected into each respective transforaminal space.  The patient was observed for 90 seconds post injection.  After no sensory deficits were reported, and normal lower extremity motor function was noted,   the above injectate was administered so that equal amounts of the injectate were placed at each foramen (level) into the transforaminal epidural space.   Additional Comments:  No complications occurred Dressing: 2 x 2 sterile gauze and Band-Aid    Post-procedure details: Patient was observed during the procedure. Post-procedure instructions were reviewed.  Patient left the clinic in stable condition.

## 2022-05-30 ENCOUNTER — Other Ambulatory Visit: Payer: Self-pay | Admitting: Interventional Cardiology

## 2022-05-30 MED ORDER — TICAGRELOR 90 MG PO TABS: 90.0000 mg | ORAL_TABLET | Freq: Two times a day (BID) | ORAL | 3 refills | Status: DC

## 2022-07-10 NOTE — Progress Notes (Signed)
Patient Care Team: Seward Carol, MD as PCP - General (Internal Medicine) Jettie Booze, MD as PCP - Cardiology (Cardiology) Luberta Robertson, MD (Radiology) Hennie Duos, MD as Consulting Physician (Rheumatology) Domingo Pulse, MD (Urology) Druscilla Brownie, MD as Consulting Physician (Dermatology) Luberta Mutter, MD as Consulting Physician (Ophthalmology) Magnus Sinning, MD as Consulting Physician (Physical Medicine and Rehabilitation) Megan Salon, MD as Consulting Physician (Gynecology) Loney Loh, MD (Dermatology)  DIAGNOSIS:  Encounter Diagnosis  Name Primary?   Malignant neoplasm of upper-outer quadrant of right breast in female, estrogen receptor positive ([place]) Yes    SUMMARY OF ONCOLOGIC HISTORY: Oncology History  Malignant neoplasm of upper-outer quadrant of right breast in female, estrogen receptor positive ([place])  08/23/2015 Initial Biopsy   Right breast upper outer quadrant and lymph node biposy: IDC, grade 2, ER+, PR 5% +, Ki-67 20-30%, HER-2 negative.    09/01/2015 Initial Diagnosis   Malignant neoplasm of upper-outer quadrant of right breast in female, estrogen receptor positive ([place])   09/12/2015 - 11/14/2015 Neo-Adjuvant Chemotherapy   Docetaxel and Cyclophosphamide x 4   09/13/2015 Initial Biopsy   Satelite nodule biopsy: IDC, grade 2, identical prognostic panel to previous biopsy   12/05/2015 Surgery   Right lumpectomy with ALND Dalbert Batman): IDC grade 2, 1.5cm, DCIS, + lymphovascular invasion, 15/17 SLN positive   01/18/2016 - 03/05/2016 Radiation Therapy   Adjuvant radiation with capecitabine ([place]): 1) Right breast/nodal areas: 45 Gy in 25 fractions  2) Right breast boost: 16 Gy in 8 fractions   02/07/2016 Genetic Testing   Genetic testing was normal, and did not reveal a deleterious mutation.  Additionally, no variants of uncertain significance (VUSes) were found.  Genes tested include: ATM, BARD1, BRCA1, BRCA2, BRIP1, CDH1, CHEK2,  FANCC, MLH1, MSH2, MSH6, NBN, PALB2, PMS2, PTEN, RAD51C, RAD51D, TP53, and XRCC2.  This panel also includes deletion/duplication analysis (without sequencing) for one gene, EPCAM.   06/01/2016 -  Anti-estrogen oral therapy   Anastrozole daily (on pallas)     CHIEF COMPLIANT: Estrogen receptor positive breast cancer   INTERVAL HISTORY: [name] is a 77 yo with the above mention Estrogen receptor positive breast cancer. [name] presents to the clinic today for a follow-up.  [name] was enrolled in [place] study and [name] is here for annual follow-up.  [name] has been randomized to anastrozole alone.  [name] is tolerating it fairly well without any major problems or concerns.  Denies symptoms or nodules in the breast.   ALLERGIES:  is allergic to chlorhexidine and other.  MEDICATIONS:  Current Outpatient Medications  Medication Sig Dispense Refill   albuterol (PROVENTIL HFA;VENTOLIN HFA) 108 (90 Base) MCG/ACT inhaler Inhale 1-2 puffs into the lungs every 6 (six) hours as needed for wheezing or shortness of breath. Started in 1998     anastrozole (ARIMIDEX) 1 MG tablet Take 1 tablet (1 mg total) by mouth daily. 90 tablet 3   buPROPion (WELLBUTRIN XL) 300 MG 24 hr tablet Take 300 mg by mouth daily.      Calcium Carbonate-Vitamin D (CALCIUM + D PO) Take 1 tablet by mouth at bedtime. Started approximately 1998     carisoprodol (SOMA) 350 MG tablet Take 350 mg by mouth 2 (two) times daily as needed for muscle spasms. Started in 2008.  2   diazepam (VALIUM) 5 MG tablet Take 5 mg by mouth every 6 (six) hours as needed for muscle spasms (interstitial cystitis). Started approximately 2008.     furosemide (LASIX) 20 MG tablet TAKE  1 TABLET(20 MG) BY MOUTH DAILY AS NEEDED FOR SHORTNESS OF BREATH 30 tablet 5   gabapentin (NEURONTIN) 300 MG capsule TAKE 3 TO 4 CAPSULES(900 TO 1200 MG) BY MOUTH AT BEDTIME 120 capsule 4   HYDROcodone-acetaminophen (NORCO) 10-325 MG tablet Take 1 tablet by mouth every 4 (four) hours as  needed for moderate pain or severe pain (as needed for fibromyalgia and IC).      hydrOXYzine (ATARAX/VISTARIL) 25 MG tablet Take 1 tablet by mouth at bedtime.     levothyroxine (SYNTHROID, LEVOTHROID) 75 MCG tablet Take 75 mcg by mouth daily.  2   LORazepam (ATIVAN) 2 MG tablet Take 1 tablet by mouth daily as needed for anxiety.   5   nitroGLYCERIN (NITROSTAT) 0.4 MG SL tablet FOR CHEST PAIN 1 TABLET UNDER THE TONGUE WAIT 5 MINUTES AND IF STILL HAVING CHEST PAIN REPEAT 2 MORE TIME IF STILL PAIN CALL 911 OR GO TO ER 25 tablet 0   ondansetron (ZOFRAN) 4 MG tablet Take 1-2 tablets (4-8 mg total) by mouth every 8 (eight) hours as needed for nausea or vomiting. 60 tablet 4   pantoprazole (PROTONIX) 40 MG tablet Take 40 mg by mouth daily. Started in 2012.     potassium chloride (KLOR-CON) 10 MEQ tablet TAKE 1 TABLET BY MOUTH AS NEEDED. TAKE WITH LASIX 30 tablet 5   promethazine (PHENERGAN) 25 MG tablet Take 0.5-1 tablets (12.5-25 mg total) by mouth every 6 (six) hours as needed for nausea or vomiting. 90 tablet 4   rosuvastatin (CRESTOR) 40 MG tablet TAKE 1 TABLET(40 MG) BY MOUTH DAILY 90 tablet 0   ticagrelor (BRILINTA) 90 MG TABS tablet Take 1 tablet (90 mg total) by mouth 2 (two) times daily. 180 tablet 3   Vaginal Lubricant (REPLENS VA) Place vaginally daily.     Vitamin D, Ergocalciferol, (DRISDOL) 1.25 MG (50000 UNIT) CAPS capsule Take 1 capsule (50,000 Units total) by mouth every 7 (seven) days. 12 capsule 2   zolpidem (AMBIEN) 10 MG tablet Take 10 mg by mouth at bedtime as needed for sleep.     No current facility-administered medications for this visit.    PHYSICAL EXAMINATION: ECOG PERFORMANCE STATUS: 1 - Symptomatic but completely ambulatory  Vitals:   07/16/22 1424  BP: (!) 151/64  Pulse: 79  Resp: 14  Temp: 97.9 F (36.6 C)  SpO2: 95%   Filed Weights   07/16/22 1424  Weight: 145 lb 8 oz (66 kg)     LABORATORY DATA:  I have reviewed the data as listed    Latest Ref Rng &  Units 01/04/2022   10:06 AM 07/11/2021    2:28 PM 01/11/2021    2:34 PM  CMP  Glucose 70 - 99 mg/dL 97  103  105   BUN 8 - 23 mg/dL 15  16  16    Creatinine 0.44 - 1.00 mg/dL 0.79  0.81  0.87   Sodium 135 - 145 mmol/L 138  137  142   Potassium 3.5 - 5.1 mmol/L 4.1  4.2  4.2   Chloride 98 - 111 mmol/L 105  104  107   CO2 22 - 32 mmol/L 27  24  24    Calcium 8.9 - 10.3 mg/dL 9.2  9.2  9.5   Total Protein 6.5 - 8.1 g/dL 7.0  7.1  7.2   Total Bilirubin 0.3 - 1.2 mg/dL 0.7  0.5  0.5   Alkaline Phos 38 - 126 U/L 79  71  82   AST  15 - 41 U/L 28  18  19    ALT 0 - 44 U/L 24  14  17      Lab Results  Component Value Date   WBC 7.2 01/04/2022   HGB 13.7 01/04/2022   HCT 40.1 01/04/2022   MCV 94.1 01/04/2022   PLT 252 01/04/2022   NEUTROABS 4.4 01/04/2022    ASSESSMENT & PLAN:  Malignant neoplasm of upper-outer quadrant of right breast in female, estrogen receptor positive ([place]) 08/23/2015: Right breast T1N2 stage IIIa grade 2 IDC ER positive, PR 5%, Ki-67 20 to 30%, HER2 negative 08/24/15: Left breast biopsy: CSL 09/12/2015-11/14/2015: Neoadjuvant TC x4 12/05/2015: Right lumpectomy: T2N3 stage IIIc IDC grade 2 01/18/2016-03/15/2016: Adjuvant radiation with capecitabine (capecitabine maintenance completed January 2018) Current treatment: Anastrozole started 06/01/2016 ([facility] clinical trial: Randomized to anastrozole alone) Anastrozole toxicities: Tolerating it well without any major problems or concerns.   Chemo-induced peripheral neuropathy   Breast cancer surveillance: 1.  Breast exam 07/16/2022: Benign 2. mammogram scheduled for 10/11/2022   Return to clinic in 1 year for follow-up    No orders of the defined types were placed in this encounter.  The patient has a good understanding of the overall plan. [name] agrees with it. [name] will call with any problems that may develop before the next visit here. Total time spent: 30 mins including face to face time and time spent for planning, charting  and co-ordination of care   Harriette Ohara, MD 07/16/22    I Gardiner Coins am acting as a Education administrator for [employer]  I have reviewed the above documentation for accuracy and completeness, and I agree with the above.

## 2022-07-16 ENCOUNTER — Inpatient Hospital Stay: Payer: Medicare Other | Attending: Hematology and Oncology | Admitting: Hematology and Oncology

## 2022-07-16 ENCOUNTER — Encounter: Payer: Self-pay | Admitting: *Deleted

## 2022-07-16 ENCOUNTER — Other Ambulatory Visit: Payer: Self-pay

## 2022-07-16 VITALS — BP 151/64 | HR 79 | Temp 97.9°F | Resp 14 | Wt 145.5 lb

## 2022-07-16 DIAGNOSIS — C50411 Malignant neoplasm of upper-outer quadrant of right female breast: Secondary | ICD-10-CM | POA: Diagnosis not present

## 2022-07-16 DIAGNOSIS — Z170 Estrogen receptor positive status [ER+]: Secondary | ICD-10-CM | POA: Insufficient documentation

## 2022-07-16 DIAGNOSIS — Z79811 Long term (current) use of aromatase inhibitors: Secondary | ICD-10-CM | POA: Insufficient documentation

## 2022-07-16 DIAGNOSIS — Z79899 Other long term (current) drug therapy: Secondary | ICD-10-CM | POA: Insufficient documentation

## 2022-07-16 DIAGNOSIS — Z9221 Personal history of antineoplastic chemotherapy: Secondary | ICD-10-CM | POA: Insufficient documentation

## 2022-07-16 DIAGNOSIS — Z923 Personal history of irradiation: Secondary | ICD-10-CM | POA: Insufficient documentation

## 2022-07-16 MED ORDER — GABAPENTIN 300 MG PO CAPS: ORAL_CAPSULE | ORAL | 4 refills | Status: DC

## 2022-07-16 MED ORDER — PROMETHAZINE HCL 25 MG PO TABS: 12.5000 mg | ORAL_TABLET | Freq: Four times a day (QID) | ORAL | 4 refills | Status: AC | PRN

## 2022-07-16 NOTE — Research (Addendum)
PALLAS: PALBOCICLIB COLLABORATIVE ADJUVANT STUDY: A RANDOMIZED PHASE III TRIAL OF PALBOCICLIB WITH STANDARD ADJUVANT ENDOCRINE THERAPY VERSUS STANDARD ADJUVANT ENDOCRINE THERAPY ALONE FOR HORMONE RECEPTOR POSITIVE (HR+)/HUMAN EPIDERMAL GROWTH FACTOR RECEPTOR 2 (HER2)-NEGATIVE EARLY BREAST CANCER   AFT-05 PALLAS - Month 72 follow-up visit Patient into clinic today unaccompanied for a study visit with Dr.Gudena.  Research nurse met with patient to collect data for this time point.  Lab- No research specimens required today.    Anti-cancer medications - Patient states she is continuing to take anastrozole without interruption; she is on no new anti-cancer medications.   Serious adverse events - Patient denies any serious health problems since her last visit.   Disease monitoring - See MD note. Dr. Pamelia Hoit examined the pt today he states patient has no evidence of disease recurrence.     COVID-19 - Patient reports she has not had any Covid-19 vaccines or positive Covid-19 tests since last study visit.      The patient is aware that her next research visit- year 7 is due in March 2025 and research specimens will be collected at that time. The research staff will see the pt at this visit.  The patient was thanked for her participation in this research study.  Domenica Reamer, BSN, RN, CCRP Clinical Research Nurse II 07/16/2022 2:39 PM

## 2022-07-16 NOTE — Assessment & Plan Note (Addendum)
08/23/2015: Right breast T1N2 stage IIIa grade 2 IDC ER positive, PR 5%, Ki-67 20 to 30%, HER2 negative 08/24/15: Left breast biopsy: CSL 09/12/2015-11/14/2015: Neoadjuvant TC x4 12/05/2015: Right lumpectomy: T2N3 stage IIIc IDC grade 2 01/18/2016-03/15/2016: Adjuvant radiation with capecitabine (capecitabine maintenance completed January 2018) Current treatment: Anastrozole started 06/01/2016 ([facility] clinical trial: Randomized to anastrozole alone) Anastrozole toxicities: Tolerating it well without any major problems or concerns.   Chemo-induced peripheral neuropathy: 7 out of 10:    Breast cancer surveillance: 1.  Breast exam 07/16/2022: Benign 2. mammogram scheduled for 10/11/2022   Return to clinic in 1 year for follow-up

## 2022-07-25 ENCOUNTER — Encounter: Payer: Self-pay | Admitting: Rehabilitation

## 2022-08-26 ENCOUNTER — Encounter: Payer: Self-pay | Admitting: Rehabilitation

## 2022-09-07 ENCOUNTER — Other Ambulatory Visit: Payer: Self-pay | Admitting: Interventional Cardiology

## 2022-09-30 ENCOUNTER — Encounter: Payer: Self-pay | Admitting: Rehabilitation

## 2022-10-03 ENCOUNTER — Ambulatory Visit
Admission: RE | Admit: 2022-10-03 | Discharge: 2022-10-03 | Disposition: A | Discharge status: Home / Self Care | Payer: Medicare Other | Source: Ambulatory Visit | Attending: Adult Health | Admitting: Adult Health

## 2022-10-03 DIAGNOSIS — R921 Mammographic calcification found on diagnostic imaging of breast: Secondary | ICD-10-CM

## 2022-10-04 ENCOUNTER — Other Ambulatory Visit: Payer: Self-pay | Admitting: Adult Health

## 2022-10-04 DIAGNOSIS — R921 Mammographic calcification found on diagnostic imaging of breast: Secondary | ICD-10-CM

## 2022-10-25 ENCOUNTER — Telehealth: Payer: Self-pay | Admitting: Physical Medicine and Rehabilitation

## 2022-10-25 NOTE — Telephone Encounter (Signed)
Patient called. She would like an appointment with Dr. Newton.  

## 2022-10-29 ENCOUNTER — Other Ambulatory Visit: Payer: Self-pay | Admitting: Physical Medicine and Rehabilitation

## 2022-10-29 DIAGNOSIS — M5416 Radiculopathy, lumbar region: Secondary | ICD-10-CM

## 2022-10-29 NOTE — Telephone Encounter (Signed)
Spoke with patient and she is requesting an injection. The injection she received on /11/24 lasted 5 months and she had 75% relief. No new falls, accidents or injuries. Please advise

## 2022-10-29 NOTE — Progress Notes (Signed)
Patient reports greater than 75% relief of pain for greater than 5 months with bilateral L5 transforaminal epidural steroid injection performed in our office on 05/10/2022. I will place order for repeat injection.

## 2022-11-08 ENCOUNTER — Telehealth: Payer: Self-pay | Admitting: Physical Medicine and Rehabilitation

## 2022-11-08 NOTE — Telephone Encounter (Signed)
Patient called needing a call back to schedule an appointment with Dr. Alvester Morin for her back. The number to contact patient is [phone]

## 2022-11-08 NOTE — Telephone Encounter (Signed)
Spoke with patient and scheduled injection for 11/20/22. Patient aware driver needed

## 2022-11-20 ENCOUNTER — Ambulatory Visit (INDEPENDENT_AMBULATORY_CARE_PROVIDER_SITE_OTHER): Payer: Medicare Other | Admitting: Physical Medicine and Rehabilitation

## 2022-11-20 ENCOUNTER — Other Ambulatory Visit: Payer: Self-pay

## 2022-11-20 VITALS — BP 146/87 | HR 70

## 2022-11-20 DIAGNOSIS — M5416 Radiculopathy, lumbar region: Secondary | ICD-10-CM | POA: Diagnosis not present

## 2022-11-20 MED ORDER — METHYLPREDNISOLONE ACETATE 80 MG/ML IJ SUSP
80.0000 mg | Freq: Once | INTRAMUSCULAR | Status: AC
  Administered 2022-11-20: 80 mg

## 2022-11-20 NOTE — Patient Instructions (Signed)

## 2022-11-20 NOTE — Progress Notes (Signed)
Functional Pain Scale - descriptive words and definitions  Distressing (6)    Pain is present/unable to complete most ADLs limited by pain/sleep is difficult and active distraction is only marginal. Moderate range order  Average Pain 8-9   +Driver, -BT, -Dye Allergies.  Lower back pain on both sides that radiates into both legs

## 2022-11-28 NOTE — Progress Notes (Signed)
[name] - 76 y.o. female MRN [id number]  Date of birth: 08/31/1945  Office Visit Note: Visit Date: 11/20/2022 PCP: Renford Dills, MD Referred by: Renford Dills, MD  Subjective: Chief Complaint  Patient presents with   Lower Back - Pain   HPI:  [name] is a 77 y.o. female who comes in today for planned repeat Bilateral L5-S1  Lumbar Transforaminal epidural steroid injection with fluoroscopic guidance.  The patient has failed conservative care including home exercise, medications, time and activity modification.  This injection will be diagnostic and hopefully therapeutic.  Please see requesting physician notes for further details and justification. Patient received more than 50% pain relief from prior injection.   Referring: Ellin Goodie, FNP and Dr. Aldean Baker   ROS Otherwise per HPI.  Assessment & Plan: Visit Diagnoses:    ICD-10-CM   1. Lumbar radiculopathy  M54.16 XR C-ARM NO REPORT    Epidural Steroid injection    methylPREDNISolone acetate (DEPO-MEDROL) injection 80 mg      Plan: No additional findings.   Meds & Orders:  Meds ordered this encounter  Medications   methylPREDNISolone acetate (DEPO-MEDROL) injection 80 mg    Orders Placed This Encounter  Procedures   XR C-ARM NO REPORT   Epidural Steroid injection    Follow-up: Return if symptoms worsen or fail to improve.   Procedures: No procedures performed  Lumbosacral Transforaminal Epidural Steroid Injection - Sub-Pedicular Approach with Fluoroscopic Guidance  Patient: [name]      Date of Birth: 1945/10/03 MRN: [id number] PCP: Renford Dills, MD      Visit Date: 11/20/2022   Universal Protocol:    Date/Time: 11/20/2022  Consent Given By: the patient  Position: PRONE  Additional Comments: Vital signs were monitored before and after the procedure. Patient was prepped and draped in the usual sterile fashion. The correct patient, procedure, and site was  verified.   Injection Procedure Details:   Procedure diagnoses: Lumbar radiculopathy [M54.16]    Meds Administered:  Meds ordered this encounter  Medications   methylPREDNISolone acetate (DEPO-MEDROL) injection 80 mg    Laterality: Bilateral  Location/Site: L5  Needle:5.0 in., 22 ga.  Short bevel or Quincke spinal needle  Needle Placement: Transforaminal  Findings:    -Comments: Excellent flow of contrast along the nerve, nerve root and into the epidural space.  Procedure Details: After squaring off the end-plates to get a true AP view, the C-arm was positioned so that an oblique view of the foramen as noted above was visualized. The target area is just inferior to the "nose of the scotty dog" or sub pedicular. The soft tissues overlying this structure were infiltrated with 2-3 ml. of 1% Lidocaine without Epinephrine.  The spinal needle was inserted toward the target using a "trajectory" view along the fluoroscope beam.  Under AP and lateral visualization, the needle was advanced so it did not puncture dura and was located close the 6 O'Clock position of the pedical in AP tracterory. Biplanar projections were used to confirm position. Aspiration was confirmed to be negative for CSF and/or blood. A 1-2 ml. volume of Isovue-250 was injected and flow of contrast was noted at each level. Radiographs were obtained for documentation purposes.   After attaining the desired flow of contrast documented above, a 0.5 to 1.0 ml test dose of 0.25% Marcaine was injected into each respective transforaminal space.  The patient was observed for 90 seconds post injection.  After no sensory deficits were reported, and  normal lower extremity motor function was noted,   the above injectate was administered so that equal amounts of the injectate were placed at each foramen (level) into the transforaminal epidural space.   Additional Comments:  No complications occurred Dressing: 2 x 2 sterile gauze and  Band-Aid    Post-procedure details: Patient was observed during the procedure. Post-procedure instructions were reviewed.  Patient left the clinic in stable condition.    Clinical History: MRI LUMBAR SPINE WITHOUT CONTRAST     TECHNIQUE:  Multiplanar, multisequence MR imaging of the lumbar spine was  performed. No intravenous contrast was administered.     COMPARISON:  01/29/2014     FINDINGS:  Segmentation:  5 lumbar type vertebrae     Alignment: Levoscoliosis. Grade 1 anterolisthesis at L4-5. Mild  retrolisthesis at L1-2 and L2-3. Misalignment has progressed from  2015.     Vertebrae:  No fracture, evidence of discitis, or bone lesion.     Conus medullaris and cauda equina: Conus extends to the L1-2 level.  Conus and cauda equina appear normal. 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     Disc levels:     T12- L1: Disc narrowing and ventral spurring.  No impingement     L1-L2: Rightward disc collapse with far-lateral bulging and ridging.  Asymmetric right facet spurring. Moderate right foraminal narrowing.  Noncompressive right subarticular recess stenosis.     L2-L3: Disc narrowing and bulging with retrolisthesis. Mild facet  spurring. Mild to moderate left foraminal narrowing.     L3-L4: Disc narrowing and left eccentric bulging. Degenerative facet  spurring asymmetric to the left. Mild left subarticular recess  stenosis.     L4-L5: Facet osteoarthritis with spurring and anterolisthesis.  Moderate disc space narrowing with mild bulging. Bilateral root  sleeve cysts. Mild to moderate spinal stenosis with asymmetric left  subarticular recess narrowing     L5-S1:Disc narrowing and minor bulging. Mild facet spurring. No  neural compression.     IMPRESSION:  1. 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  2. Mild to moderate  foraminal narrowing on the right at L1-2 and  left at L2-3.  3. Mild to moderate spinal stenosis at L4-5 with asymmetric left  subarticular recess narrowing.        Electronically Signed    By: Marnee Spring M.D.    On: 05/08/2020 07:15     Objective:  VS:  HT:    WT:   BMI:     BP:(!) 146/87  HR:70bpm  TEMP: ( )  RESP:  Physical Exam Vitals and nursing note reviewed.  Constitutional:      General: She is not in acute distress.    Appearance: Normal appearance. She is not ill-appearing.  HENT:     Head: Normocephalic and atraumatic.     Right Ear: External ear normal.     Left Ear: External ear normal.  Eyes:     Extraocular Movements: Extraocular movements intact.  Cardiovascular:     Rate and Rhythm: Normal rate.     Pulses: Normal pulses.  Pulmonary:     Effort: Pulmonary effort is normal. No respiratory distress.  Abdominal:     General: There is no distension.     Palpations: Abdomen is soft.  Musculoskeletal:        General: Tenderness present.     Cervical  back: Neck supple.     Right lower leg: No edema.     Left lower leg: No edema.     Comments: Patient has good distal strength with no pain over the greater trochanters.  No clonus or focal weakness.  Skin:    Findings: No erythema, lesion or rash.  Neurological:     General: No focal deficit present.     Mental Status: She is alert and oriented to person, place, and time.     Sensory: No sensory deficit.     Motor: No weakness or abnormal muscle tone.     Coordination: Coordination normal.  Psychiatric:        Mood and Affect: Mood normal.        Behavior: Behavior normal.      Imaging: No results found.

## 2022-11-28 NOTE — Procedures (Signed)
Lumbosacral Transforaminal Epidural Steroid Injection - Sub-Pedicular Approach with Fluoroscopic Guidance  Patient: [name]      Date of Birth: 08-May-1945 MRN: [id number] PCP: Renford Dills, MD      Visit Date: 11/20/2022   Universal Protocol:    Date/Time: 11/20/2022  Consent Given By: the patient  Position: PRONE  Additional Comments: Vital signs were monitored before and after the procedure. Patient was prepped and draped in the usual sterile fashion. The correct patient, procedure, and site was verified.   Injection Procedure Details:   Procedure diagnoses: Lumbar radiculopathy [M54.16]    Meds Administered:  Meds ordered this encounter  Medications   methylPREDNISolone acetate (DEPO-MEDROL) injection 80 mg    Laterality: Bilateral  Location/Site: L5  Needle:5.0 in., 22 ga.  Short bevel or Quincke spinal needle  Needle Placement: Transforaminal  Findings:    -Comments: Excellent flow of contrast along the nerve, nerve root and into the epidural space.  Procedure Details: After squaring off the end-plates to get a true AP view, the C-arm was positioned so that an oblique view of the foramen as noted above was visualized. The target area is just inferior to the "nose of the scotty dog" or sub pedicular. The soft tissues overlying this structure were infiltrated with 2-3 ml. of 1% Lidocaine without Epinephrine.  The spinal needle was inserted toward the target using a "trajectory" view along the fluoroscope beam.  Under AP and lateral visualization, the needle was advanced so it did not puncture dura and was located close the 6 O'Clock position of the pedical in AP tracterory. Biplanar projections were used to confirm position. Aspiration was confirmed to be negative for CSF and/or blood. A 1-2 ml. volume of Isovue-250 was injected and flow of contrast was noted at each level. Radiographs were obtained for documentation purposes.   After attaining the desired  flow of contrast documented above, a 0.5 to 1.0 ml test dose of 0.25% Marcaine was injected into each respective transforaminal space.  The patient was observed for 90 seconds post injection.  After no sensory deficits were reported, and normal lower extremity motor function was noted,   the above injectate was administered so that equal amounts of the injectate were placed at each foramen (level) into the transforaminal epidural space.   Additional Comments:  No complications occurred Dressing: 2 x 2 sterile gauze and Band-Aid    Post-procedure details: Patient was observed during the procedure. Post-procedure instructions were reviewed.  Patient left the clinic in stable condition.

## 2022-11-30 ENCOUNTER — Other Ambulatory Visit: Payer: Self-pay | Admitting: Interventional Cardiology

## 2022-12-17 ENCOUNTER — Other Ambulatory Visit: Payer: Self-pay

## 2022-12-17 MED ORDER — ANASTROZOLE 1 MG PO TABS: 1.0000 mg | ORAL_TABLET | Freq: Every day | ORAL | 3 refills | Status: AC

## 2022-12-17 NOTE — Telephone Encounter (Signed)
Pt called and requested refill for anastrozole. She reports she is now living at [facility] in their independent living and states she is very happy there. She knows to call with any concerns.

## 2022-12-21 ENCOUNTER — Telehealth: Payer: Self-pay | Admitting: Hematology and Oncology

## 2022-12-21 NOTE — Telephone Encounter (Signed)
Scheduled appointment per staff message. Patient is aware of the made appointment.

## 2022-12-28 ENCOUNTER — Telehealth: Payer: Self-pay | Admitting: Radiology

## 2022-12-28 NOTE — Telephone Encounter (Signed)
P[facility]AS: PALBOCICLIB COLLABORATIVE ADJUVANT STUDY: A RANDOMIZED PHASE III TRIAL OF PALBOCICLIB WITH STANDARD ADJUVANT ENDOCRINE THERAPY VERSUS STANDARD ADJUVANT ENDOCRINE THERAPY ALONE FOR HORMONE RECEPTOR POSITIVE (HR+)/HUMAN EPIDERMAL GROWTH FACTOR RECEPTOR 2 (HER2)-NEGATIVE EARLY BREAST CANCER   12/28/2022  PHONE CALL: LVM for patient requesting a call back to confirm if patient is okay with 7 yr lab collection for the above mentioned study. If she is okay with the labs being collected, we can add a lab appt onto her visit w/ Dr. Pamelia Hoit in March 2025.   [name], RT(R)(T) Clinical Research Coordinator

## 2023-01-01 ENCOUNTER — Telehealth: Payer: Self-pay | Admitting: Radiology

## 2023-01-01 NOTE — Telephone Encounter (Signed)
[facility]: PALBOCICLIB COLLABORATIVE ADJUVANT STUDY: A RANDOMIZED PHASE III TRIAL OF PALBOCICLIB WITH STANDARD ADJUVANT ENDOCRINE THERAPY VERSUS STANDARD ADJUVANT ENDOCRINE THERAPY ALONE FOR HORMONE RECEPTOR POSITIVE (HR+)/HUMAN EPIDERMAL GROWTH FACTOR RECEPTOR 2 (HER2)-NEGATIVE EARLY BREAST CANCER   01/01/23  PHONE CALL: Confirmed I was speaking with [name] . Patient returned call in reference to 59yr and 10 yr labs now being optional for the above mentioned study. Patient stated she would like to complete labs at these time points. Informed patient her 7 yr labs will be due at her next visit in March 2025 and we scheduled this lab visit for 2PM on 07/16/23. Thanked patient for her time and continued participation in the above mentioned study.   [name], RT(R)(T) Clinical Research Coordinator

## 2023-01-08 ENCOUNTER — Other Ambulatory Visit: Payer: Self-pay | Admitting: Pharmacist

## 2023-01-10 ENCOUNTER — Telehealth: Payer: Self-pay | Admitting: Interventional Cardiology

## 2023-01-10 NOTE — Telephone Encounter (Signed)
I spoke with patient.  She is asking who Dr Eldridge Dace recommends she see when he leaves.

## 2023-01-10 NOTE — Telephone Encounter (Signed)
Pt would like to speak with Jacolyn Reedy. Please advise

## 2023-01-14 ENCOUNTER — Telehealth (HOSPITAL_BASED_OUTPATIENT_CLINIC_OR_DEPARTMENT_OTHER): Payer: Self-pay

## 2023-01-14 MED ORDER — NITROGLYCERIN 0.4 MG SL SUBL: SUBLINGUAL_TABLET | SUBLINGUAL | 6 refills | Status: DC

## 2023-01-14 NOTE — Telephone Encounter (Signed)
Pt returning nurse's call. Please advise

## 2023-01-14 NOTE — Telephone Encounter (Signed)
Left message to call office

## 2023-01-14 NOTE — Telephone Encounter (Signed)
Patient called today requesting to see Dr. Hyacinth Meeker for spotting and "female" infection. Patient was advised that Dr. Hyacinth Meeker is out of the office for the week and we would be happy to make her an appointment with an available provider. Patient was ok with seeing Collene Gobble on Thursday at 11:15. tbw

## 2023-01-14 NOTE — Telephone Encounter (Signed)
I spoke with patient.  She will call office back to schedule January appointment once she decides if she would like to see Dr Clifton James, Dr Thukkani,Dr [place] or Dr Herbie Baltimore

## 2023-01-17 ENCOUNTER — Other Ambulatory Visit (HOSPITAL_COMMUNITY)
Admission: RE | Admit: 2023-01-17 | Discharge: 2023-01-17 | Disposition: A | Discharge status: Home / Self Care | Payer: Medicare Other | Source: Ambulatory Visit | Attending: Advanced Practice Midwife | Admitting: Advanced Practice Midwife

## 2023-01-17 ENCOUNTER — Ambulatory Visit (HOSPITAL_BASED_OUTPATIENT_CLINIC_OR_DEPARTMENT_OTHER): Payer: Medicare Other | Admitting: Advanced Practice Midwife

## 2023-01-17 ENCOUNTER — Encounter (HOSPITAL_BASED_OUTPATIENT_CLINIC_OR_DEPARTMENT_OTHER): Payer: Self-pay | Admitting: Advanced Practice Midwife

## 2023-01-17 VITALS — BP 172/81 | HR 63 | Ht 62.0 in | Wt 148.6 lb

## 2023-01-17 DIAGNOSIS — I2510 Atherosclerotic heart disease of native coronary artery without angina pectoris: Secondary | ICD-10-CM

## 2023-01-17 DIAGNOSIS — R030 Elevated blood-pressure reading, without diagnosis of hypertension: Secondary | ICD-10-CM | POA: Diagnosis not present

## 2023-01-17 DIAGNOSIS — N939 Abnormal uterine and vaginal bleeding, unspecified: Secondary | ICD-10-CM

## 2023-01-17 DIAGNOSIS — N898 Other specified noninflammatory disorders of vagina: Secondary | ICD-10-CM

## 2023-01-17 DIAGNOSIS — R109 Unspecified abdominal pain: Secondary | ICD-10-CM

## 2023-01-17 LAB — POCT URINALYSIS DIPSTICK
Bilirubin, UA: NEGATIVE
Blood, UA: NEGATIVE
Glucose, UA: NEGATIVE
Ketones, UA: NEGATIVE
Leukocytes, UA: NEGATIVE
Nitrite, UA: NEGATIVE
Protein, UA: NEGATIVE
Spec Grav, UA: 1.02 (ref 1.010–1.025)
Urobilinogen, UA: 0.2 E.U./dL
pH, UA: 7 (ref 5.0–8.0)

## 2023-01-17 MED ORDER — METRONIDAZOLE 0.75 % VA GEL: 1.0000 | Freq: Every day | VAGINAL | 0 refills | Status: AC

## 2023-01-17 NOTE — Progress Notes (Signed)
   GYNECOLOGY PROGRESS NOTE  History:  77 y.o. G2P2002 presents to [facility] office today for problem gyn visit. Her medical hx is significant for breast cancer, cardiac stent placement, congestive heart failure, interstitial cystitis, and fibromyalgia.  She reports vaginal irritation and discharge and concerns for bacterial infection.  Discharge was light brown/tan without bright red or pink bleeding noted. Symptoms started ~ 1 week ago and were associated with some general malaise and lower abdominal cramping as well. This has accompanied bacterial infections for the patient in the past. She also has interstitial cystitis and reports some increased frequency this week.   She denies h/a, dizziness, shortness of breath, n/v, or fever/chills.    The following portions of the patient's history were reviewed and updated as appropriate: allergies, current medications, past family history, past medical history, past social history, past surgical history and problem list.   Health Maintenance Due  Topic Date Due   Medicare Annual Wellness (AWV)  Never done   Hepatitis C Screening  Never done   DTaP/Tdap/Td (1 - Tdap) Never done   Pneumonia Vaccine 16+ Years old (1 of 1 - PCV) Never done   Zoster Vaccines- Shingrix (2 of 2) 07/15/2018   Cervical Cancer Screening (Pap smear)  12/28/2020   INFLUENZA VACCINE  11/29/2022   COVID-19 Vaccine (4 - 2023-24 season) 12/30/2022     Review of Systems:  Pertinent items are noted in HPI.   Objective:  Physical Exam Blood pressure (!) 172/81, pulse 63, height 5\' 2"  (1.575 m), weight 148 lb 9.6 oz (67.4 kg), last menstrual period 04/30/1996. VS reviewed, nursing note reviewed,  Constitutional: well developed, well nourished, no distress HEENT: normocephalic CV: normal rate Pulm/chest wall: normal effort Breast Exam: deferred Abdomen: soft Neuro: alert and oriented x 3 Skin: warm, dry Psych: affect normal Pelvic exam: Cervix pink, visually closed,  without lesion, scant white creamy discharge, vaginal walls and external genitalia normal Bimanual exam: Cervix 0/long/high, firm, anterior, neg CMT, uterus nontender, nonenlarged, adnexa without tenderness, enlargement, or mass  Assessment & Plan:  1. Vaginal irritation --Exam wnl - Cervicovaginal ancillary only( [place]) - POCT Urinalysis Dipstick  2. Elevated blood pressure reading without diagnosis of hypertension --BP elevated today, on retake with machine, and done manually by CNM.   --Pt denies chest pain, SOB, or dizziness. --Pt does not take BP medications regularly, but has Lasix at home to take PRN.   --Plan for patient to take Lasix when she gets home and monitor home BP today and tomorrow. If remains elevated, follow up with PCP for blood pressure check as soon as possible.  --Reviewed s/sx, reasons to seek emergency care.  3. Coronary artery disease involving native coronary artery of native heart without angina pectoris --Given hx of cardiac stent placement, will have pt take medication and monitor BP closely, see above.   4. Vaginal spotting --No active bleeding  5. Abdominal cramping  - Urine Culture   No follow-ups on file.   Sharen Counter, CNM 11:53 AM

## 2023-01-18 LAB — CERVICOVAGINAL ANCILLARY ONLY
Bacterial Vaginitis (gardnerella): NEGATIVE
Candida Glabrata: NEGATIVE
Candida Vaginitis: NEGATIVE
Comment: NEGATIVE
Comment: NEGATIVE
Comment: NEGATIVE

## 2023-01-19 LAB — URINE CULTURE

## 2023-01-22 ENCOUNTER — Other Ambulatory Visit: Payer: Self-pay

## 2023-01-22 ENCOUNTER — Telehealth: Payer: Self-pay | Admitting: Internal Medicine

## 2023-01-22 ENCOUNTER — Ambulatory Visit: Payer: Medicare Other | Admitting: Orthopedic Surgery

## 2023-01-22 DIAGNOSIS — M5412 Radiculopathy, cervical region: Secondary | ICD-10-CM | POA: Diagnosis not present

## 2023-01-22 DIAGNOSIS — M542 Cervicalgia: Secondary | ICD-10-CM

## 2023-01-22 DIAGNOSIS — M5416 Radiculopathy, lumbar region: Secondary | ICD-10-CM | POA: Diagnosis not present

## 2023-01-22 MED ORDER — FLUCONAZOLE 150 MG PO TABS: 150.0000 mg | ORAL_TABLET | Freq: Once | ORAL | 0 refills | Status: AC

## 2023-01-22 MED ORDER — PREDNISONE 10 MG PO TABS: 20.0000 mg | ORAL_TABLET | Freq: Every day | ORAL | 0 refills | Status: DC

## 2023-01-22 NOTE — Telephone Encounter (Signed)
Pt calling in asking to speak on to [place], [place].

## 2023-01-22 NOTE — Addendum Note (Signed)
Addended by: Sharen Counter A on: 01/22/2023 07:22 PM   Modules accepted: Orders

## 2023-01-22 NOTE — Telephone Encounter (Signed)
I spoke with patient. She will see Dr Lynnette Caffey in Feb 2025.  She is aware she can call office if any problems before her appointment.

## 2023-01-27 ENCOUNTER — Encounter: Payer: Self-pay | Admitting: Orthopedic Surgery

## 2023-01-27 NOTE — Progress Notes (Signed)
Office Visit Note   Patient: [name]           Date of Birth: 06-26-1945           MRN: [id number] Visit Date: 01/22/2023              Requested by: Renford Dills, MD [address] [place],  [place] [address] PCP: Renford Dills, MD  Chief Complaint  Patient presents with   Neck - Pain      HPI: Patient is a 77 year old woman who has cervical radicular symptoms down the left shoulder.  Patient states that rotation of her neck reproduces the pain.  Patient has seen Dr. Alvester Morin for lumbar spine epidural injections in the past.  Patient cannot take anti-inflammatories due to her blood thinners and cardiac stent.  Assessment & Plan: Visit Diagnoses:  1. Neck pain   2. Lumbar radiculopathy   3. Cervical radicular pain     Plan: Prescription was provided for prednisone she will take 10 to 20 mg with breakfast and wean off as tolerated.  Reevaluate in 4 weeks.  Follow-Up Instructions: No follow-ups on file.   Ortho Exam  Patient is alert, oriented, no adenopathy, well-dressed, normal affect, normal respiratory effort. Examination patient has cervical radicular symptoms no focal motor weakness of the upper or lower extremities.  Imaging: No results found. No images are attached to the encounter.  Labs: Lab Results  Component Value Date   HGBA1C 5.4 11/04/2017   HGBA1C 5.2 05/29/2017   HGBA1C 4.9 01/03/2017   REPTSTATUS 01/05/2017 FINAL 01/03/2017   CULT (A) 01/03/2017    40,000 COLONIES/mL ESCHERICHIA COLI Confirmed Extended Spectrum Beta-Lactamase Producer (ESBL) 80,000 COLONIES/mL ENTEROCOCCUS FAECALIS    LABORGA ESCHERICHIA COLI (A) 01/03/2017   LABORGA ENTEROCOCCUS FAECALIS (A) 01/03/2017     Lab Results  Component Value Date   ALBUMIN 4.0 01/04/2022   ALBUMIN 4.3 07/11/2021   ALBUMIN 3.9 01/11/2021    No results found for: "MG" Lab Results  Component Value Date   VD25OH 34 03/21/2015   VD25OH 30 03/18/2014   VD25OH 58 02/06/2013     No results found for: "PREALBUMIN"    Latest Ref Rng & Units 01/04/2022   10:06 AM 07/11/2021    2:28 PM 01/11/2021    2:34 PM  CBC EXTENDED  WBC 4.0 - 10.5 K/uL 7.2  7.6  7.9   RBC 3.87 - 5.11 MIL/uL 4.26  4.54  4.38   Hemoglobin 12.0 - 15.0 g/dL 19.1  47.8  29.5   HCT 36.0 - 46.0 % 40.1  42.9  41.3   Platelets 150 - 400 K/uL 252  282  255   NEUT# 1.7 - 7.7 K/uL 4.4  5.5  5.6   Lymph# 0.7 - 4.0 K/uL 1.9  1.4  1.7      There is no height or weight on file to calculate BMI.  Orders:  Orders Placed This Encounter  Procedures   XR Cervical Spine 2 or 3 views   Meds ordered this encounter  Medications   predniSONE (DELTASONE) 10 MG tablet    Sig: Take 2 tablets (20 mg total) by mouth daily with breakfast.    Dispense:  60 tablet    Refill:  0     Procedures: No procedures performed  Clinical Data: No additional findings.  ROS:  All other systems negative, except as noted in the HPI. Review of Systems  Objective: Vital Signs: LMP 04/30/1996 (Approximate)   Specialty  Comments:  MRI LUMBAR SPINE WITHOUT CONTRAST     TECHNIQUE:  Multiplanar, multisequence MR imaging of the lumbar spine was  performed. No intravenous contrast was administered.     COMPARISON:  01/29/2014     FINDINGS:  Segmentation:  5 lumbar type vertebrae     Alignment: Levoscoliosis. Grade 1 anterolisthesis at L4-5. Mild  retrolisthesis at L1-2 and L2-3. Misalignment has progressed from  2015.     Vertebrae:  No fracture, evidence of discitis, or bone lesion.     Conus medullaris and cauda equina: Conus extends to the L1-2 level.  Conus and cauda equina appear normal. 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     Disc levels:     T12- L1: Disc narrowing and ventral spurring.  No impingement     L1-L2: Rightward disc collapse with far-lateral bulging and ridging.  Asymmetric right facet spurring. Moderate right  foraminal narrowing.  Noncompressive right subarticular recess stenosis.     L2-L3: Disc narrowing and bulging with retrolisthesis. Mild facet  spurring. Mild to moderate left foraminal narrowing.     L3-L4: Disc narrowing and left eccentric bulging. Degenerative facet  spurring asymmetric to the left. Mild left subarticular recess  stenosis.     L4-L5: Facet osteoarthritis with spurring and anterolisthesis.  Moderate disc space narrowing with mild bulging. Bilateral root  sleeve cysts. Mild to moderate spinal stenosis with asymmetric left  subarticular recess narrowing     L5-S1:Disc narrowing and minor bulging. Mild facet spurring. No  neural compression.     IMPRESSION:  1. 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  2. Mild to moderate foraminal narrowing on the right at L1-2 and  left at L2-3.  3. Mild to moderate spinal stenosis at L4-5 with asymmetric left  subarticular recess narrowing.        Electronically Signed    By: Marnee Spring M.D.    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   Status post coronary artery stent placement    Non-ST elevation (NSTEMI) myocardial infarction [facility]    Depression with anxiety 12/31/2016   Chest pain 12/31/2016   Hyponatremia 12/31/2016   Chronic pain 12/31/2016   Essential hypertension 12/31/2016   Unstable angina (HCC)    Family history of  breast cancer in female 02/08/2016   Rash and nonspecific skin eruption 10/03/2015   Sensorineural hearing loss (SNHL) of both ears 09/05/2015   Malignant neoplasm of upper-outer quadrant of right breast in female, estrogen receptor positive (HCC) 09/01/2015   Sclerosing adenosis of left breast 09/01/2015   Asthma 09/12/2011   Chronic vaginitis    Fibromyalgia    Interstitial cystitis    UNSPECIFIED HYPOTHYROIDISM 06/14/2009   DEPRESSIVE DISORDER NOT ELSEWHERE CLASSIFIED 06/14/2009   Hereditary and idiopathic peripheral neuropathy 06/14/2009   UNSPEC HEMORRHOIDS WITHOUT MENTION COMPLICATION 06/14/2009   ALLERGIC RHINITIS CAUSE UNSPECIFIED 06/14/2009  ESOPHAGEAL REFLUX 06/14/2009   Irritable bowel syndrome 06/14/2009   INSOMNIA UNSPECIFIED 06/14/2009   Osteoporosis 08/27/2007   Past Medical History:  Diagnosis Date   Anxiety    Arthritis 2008   Asthma 1953   age 52   Breast cancer (HCC) dx'd 2017   right   Cancer (HCC)    breast cancer   Chronic vaginitis 1998   Fibromyalgia 1982   GERD (gastroesophageal reflux disease)    since at least 2011   Hemorrhoids 2009   History of radiation therapy 01/18/16-03/05/16   right breast/nodal areas 45 Gy, right breast boost 16 Gy   Hypoglycemia 2003   Hypothyroidism 1964   age 23   Interstitial cystitis 1998   Irritable bowel syndrome 06/14/2009   Qualifier: Diagnosis of  By: Lennox Laity RN, Angelique Blonder     Menopausal symptoms    Osteoporosis 12/24/2006   Personal history of chemotherapy    Personal history of radiation therapy    Pneumonia 1979   varicella   PONV (postoperative nausea and vomiting) 1979    Family History  Problem Relation Age of Onset   Hypertension Mother    Heart disease Mother        d. 71   Parkinsonism Father        d. 38   Heart disease Sister    Breast cancer Sister 36       s/p mastectomy; reportedly negative BRCA1/2 testing approx 7 years ago   Melanoma Sister        d. 39s; hx of incresed sun exposure    Cancer Sister        dx. sarcoma of her face in her 2s; treated w/ MOHS surgery   Parkinsonism Paternal Aunt        d. 90s   Parkinsonism Paternal Uncle        d. late 48s   Kidney failure Maternal Grandmother 46   Heart attack Maternal Grandfather        d. 41s   Heart disease Paternal Grandmother        d. 60   Breast cancer Other 38       maternal great aunt (MGF's sister)   Kidney cancer Paternal Uncle 37       d. 60s; unsure about smoking status    Past Surgical History:  Procedure Laterality Date   BLADDER HYPEREXTENSION     X4, prior to 01/2006   BLADDER SUSPENSION     BREAST BIOPSY     BREAST LUMPECTOMY Right    2017   BREAST LUMPECTOMY WITH NEEDLE LOCALIZATION AND AXILLARY LYMPH NODE DISSECTION Right 12/05/2015   Procedure: RIGHT BREAST NEEDLE LOCALIZED (WIRES X 2) LUMPECTOMY WITH AXILLARY LYMPH NODE DISSECTION;  Surgeon: Claud Kelp, MD;  Location: [facility] OR;  Service: General;  Laterality: Right;   BREAST LUMPECTOMY WITH RADIOACTIVE SEED LOCALIZATION Left 12/05/2015   Procedure: RADIOACTIVE SEED GUIDED LEFT BREAST LUMPECTOMY;  Surgeon: Claud Kelp, MD;  Location: [facility] OR;  Service: General;  Laterality: Left;   BREAST SURGERY     BUNIONECTOMY     prior to 01/2006   CARDIAC CATHETERIZATION     CESAREAN SECTION  1979   CHOLECYSTECTOMY  02/08/2006   DR. INGRAM   COLONOSCOPY     CORONARY STENT INTERVENTION N/A 01/01/2017   Procedure: CORONARY STENT INTERVENTION;  Surgeon: Corky Crafts, MD;  Location[facility] INVASIVE CV LAB;  Service: Cardiovascular;  Laterality: N/A;   DENTAL SURGERY  2009-2016   13 dental  implants   LEFT HEART CATH AND CORONARY ANGIOGRAPHY N/A 01/01/2017   Procedure: LEFT HEART CATH AND CORONARY ANGIOGRAPHY;  Surgeon: Corky Crafts, MD;  Location: [facility] INVASIVE CV LAB;  Service: Cardiovascular;  Laterality: N/A;   LEG SURGERY  07/06/2004   Broken tibia   NASAL SINUS SURGERY     X4; prior to 01/2006   [facility] REMOVAL Right 12/05/2015    Procedure: REMOVAL PORT-A-CATH;  Surgeon: Claud Kelp, MD;  Location: [facility] OR;  Service: General;  Laterality: Right;   PORTACATH PLACEMENT Left 09/08/2015   Procedure: INSERTION OF PORT-A-CATH ;  Surgeon: Claud Kelp, MD;  Location: [facility] OR;  Service: General;  Laterality: Left;   Social History   Occupational History   Not on file  Tobacco Use   Smoking status: Former    Current packs/day: 0.25    Average packs/day: 0.3 packs/day for 15.0 years (3.8 ttl pk-yrs)    Types: Cigarettes   Smokeless tobacco: Never   Tobacco comments:    social smoker/"on and off"  Vaping Use   Vaping status: Never Used  Substance and Sexual Activity   Alcohol use: Yes    Alcohol/week: 0.0 standard drinks of alcohol    Comment: social   Drug use: No   Sexual activity: Not Currently    Birth control/protection: Post-menopausal

## 2023-02-19 ENCOUNTER — Ambulatory Visit: Payer: Medicare Other | Admitting: Orthopedic Surgery

## 2023-02-26 ENCOUNTER — Ambulatory Visit (INDEPENDENT_AMBULATORY_CARE_PROVIDER_SITE_OTHER): Payer: Medicare Other | Admitting: Orthopedic Surgery

## 2023-02-26 ENCOUNTER — Encounter: Payer: Self-pay | Admitting: Orthopedic Surgery

## 2023-02-26 DIAGNOSIS — M5412 Radiculopathy, cervical region: Secondary | ICD-10-CM

## 2023-02-26 DIAGNOSIS — M7711 Lateral epicondylitis, right elbow: Secondary | ICD-10-CM | POA: Diagnosis not present

## 2023-02-26 DIAGNOSIS — M205X9 Other deformities of toe(s) (acquired), unspecified foot: Secondary | ICD-10-CM | POA: Diagnosis not present

## 2023-02-26 DIAGNOSIS — M21619 Bunion of unspecified foot: Secondary | ICD-10-CM | POA: Diagnosis not present

## 2023-02-26 MED ORDER — COLCHICINE 0.6 MG PO CAPS: 0.6000 mg | ORAL_CAPSULE | Freq: Every day | ORAL | 1 refills | Status: DC | PRN

## 2023-02-26 MED ORDER — PREDNISONE 10 MG PO TABS: 20.0000 mg | ORAL_TABLET | Freq: Every day | ORAL | 0 refills | Status: DC

## 2023-02-26 NOTE — Progress Notes (Signed)
Office Visit Note   Patient: [name]           Date of Birth: 07-19-1945           MRN: [id number] Visit Date: 02/26/2023              Requested by: Renford Dills, MD [address] [place],  [place] [address] PCP: Renford Dills, MD  Chief Complaint  Patient presents with   Neck - Follow-up      HPI: Patient is a 77 year old woman who presents in follow-up for cervical spine radicular pain.  Patient used the prednisone and states she feels completely resolved after the prednisone treatment for cervical radiculopathy.  Patient also has bunion and claw toe deformities both feet and has been having lateral epicondylitis of her right elbow.  Assessment & Plan: Visit Diagnoses:  1. Bunion   2. Acquired claw toe, unspecified laterality   3. Cervical radicular pain   4. Lateral epicondylitis of right elbow     Plan: A refill prescription was provided for prednisone if needed.  She will try colchicine 0.6 mg a day as needed for musculoskeletal inflammation.  Patient states she cannot take nonsteroidals.  Recommended a tennis elbow strap for the right elbow.  Follow-Up Instructions: No follow-ups on file.   Ortho Exam  Patient is alert, oriented, no adenopathy, well-dressed, normal affect, normal respiratory effort. Examination patient's radicular symptoms have resolved.  She is point tender to palpation over the lateral epicondyle right elbow resisted extension of the wrist is painful.  Examination of her feet she does have bilateral great toe bunion deformities with the great toe overlapping the second toe with fixed clawing of the second toe.  There are no ulcers.  Imaging: No results found. No images are attached to the encounter.  Labs: Lab Results  Component Value Date   HGBA1C 5.4 11/04/2017   HGBA1C 5.2 05/29/2017   HGBA1C 4.9 01/03/2017   REPTSTATUS 01/05/2017 FINAL 01/03/2017   CULT (A) 01/03/2017    40,000 COLONIES/mL ESCHERICHIA  COLI Confirmed Extended Spectrum Beta-Lactamase Producer (ESBL) 80,000 COLONIES/mL ENTEROCOCCUS FAECALIS    LABORGA ESCHERICHIA COLI (A) 01/03/2017   LABORGA ENTEROCOCCUS FAECALIS (A) 01/03/2017     Lab Results  Component Value Date   ALBUMIN 4.0 01/04/2022   ALBUMIN 4.3 07/11/2021   ALBUMIN 3.9 01/11/2021    No results found for: "MG" Lab Results  Component Value Date   VD25OH 34 03/21/2015   VD25OH 30 03/18/2014   VD25OH 58 02/06/2013    No results found for: "PREALBUMIN"    Latest Ref Rng & Units 01/04/2022   10:06 AM 07/11/2021    2:28 PM 01/11/2021    2:34 PM  CBC EXTENDED  WBC 4.0 - 10.5 K/uL 7.2  7.6  7.9   RBC 3.87 - 5.11 MIL/uL 4.26  4.54  4.38   Hemoglobin 12.0 - 15.0 g/dL 35.5  73.2  20.2   HCT 36.0 - 46.0 % 40.1  42.9  41.3   Platelets 150 - 400 K/uL 252  282  255   NEUT# 1.7 - 7.7 K/uL 4.4  5.5  5.6   Lymph# 0.7 - 4.0 K/uL 1.9  1.4  1.7      There is no height or weight on file to calculate BMI.  Orders:  No orders of the defined types were placed in this encounter.  Meds ordered this encounter  Medications   predniSONE (DELTASONE) 10 MG tablet  Sig: Take 2 tablets (20 mg total) by mouth daily with breakfast.    Dispense:  60 tablet    Refill:  0   Colchicine 0.6 MG CAPS    Sig: Take 1 capsule (0.6 mg total) by mouth daily as needed.    Dispense:  60 capsule    Refill:  1     Procedures: No procedures performed  Clinical Data: No additional findings.  ROS:  All other systems negative, except as noted in the HPI. Review of Systems  Objective: Vital Signs: LMP 04/30/1996 (Approximate)   Specialty Comments:  MRI LUMBAR SPINE WITHOUT CONTRAST     TECHNIQUE:  Multiplanar, multisequence MR imaging of the lumbar spine was  performed. No intravenous contrast was administered.     COMPARISON:  01/29/2014     FINDINGS:  Segmentation:  5 lumbar type vertebrae     Alignment: Levoscoliosis. Grade 1 anterolisthesis at L4-5. Mild   retrolisthesis at L1-2 and L2-3. Misalignment has progressed from  2015.     Vertebrae:  No fracture, evidence of discitis, or bone lesion.     Conus medullaris and cauda equina: Conus extends to the L1-2 level.  Conus and cauda equina appear normal. 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     Disc levels:     T12- L1: Disc narrowing and ventral spurring.  No impingement     L1-L2: Rightward disc collapse with far-lateral bulging and ridging.  Asymmetric right facet spurring. Moderate right foraminal narrowing.  Noncompressive right subarticular recess stenosis.     L2-L3: Disc narrowing and bulging with retrolisthesis. Mild facet  spurring. Mild to moderate left foraminal narrowing.     L3-L4: Disc narrowing and left eccentric bulging. Degenerative facet  spurring asymmetric to the left. Mild left subarticular recess  stenosis.     L4-L5: Facet osteoarthritis with spurring and anterolisthesis.  Moderate disc space narrowing with mild bulging. Bilateral root  sleeve cysts. Mild to moderate spinal stenosis with asymmetric left  subarticular recess narrowing     L5-S1:Disc narrowing and minor bulging. Mild facet spurring. No  neural compression.     IMPRESSION:  1. 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  2. Mild to moderate foraminal narrowing on the right at L1-2 and  left at L2-3.  3. Mild to moderate spinal stenosis at L4-5 with asymmetric left  subarticular recess narrowing.        Electronically Signed    By: Marnee Spring M.D.    On: 05/08/2020 07:15  PMFS History: Patient Active Problem List   Diagnosis Date Noted   History of non-ST elevation myocardial infarction (NSTEMI) 05/15/2022   Caregiver stress 07/26/2020   Adjustment disorder with mixed anxiety and depressed mood 12/01/2019   Lumbar radiculopathy 05/11/2019    Bilateral stenosis of lateral recess of lumbar spine 05/11/2019   Hyperlipidemia 12/23/2018   Chronic diastolic CHF (congestive heart failure) (HCC) 11/24/2018   Abnormal transaminases 03/24/2018   CAD (coronary artery disease) 02/11/2017   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   Status post coronary artery stent placement    Non-ST elevation (NSTEMI) myocardial infarction [facility])    Depression with anxiety 12/31/2016   Chest pain 12/31/2016   Hyponatremia 12/31/2016   Chronic pain 12/31/2016   Essential hypertension 12/31/2016   Unstable angina (HCC)    Family history of breast cancer in  female 02/08/2016   Rash and nonspecific skin eruption 10/03/2015   Sensorineural hearing loss (SNHL) of both ears 09/05/2015   Malignant neoplasm of upper-outer quadrant of right breast in female, estrogen receptor positive (HCC) 09/01/2015   Sclerosing adenosis of left breast 09/01/2015   Asthma 09/12/2011   Chronic vaginitis    Fibromyalgia    Interstitial cystitis    UNSPECIFIED HYPOTHYROIDISM 06/14/2009   DEPRESSIVE DISORDER NOT ELSEWHERE CLASSIFIED 06/14/2009   Hereditary and idiopathic peripheral neuropathy 06/14/2009   UNSPEC HEMORRHOIDS WITHOUT MENTION COMPLICATION 06/14/2009   ALLERGIC RHINITIS CAUSE UNSPECIFIED 06/14/2009   ESOPHAGEAL REFLUX 06/14/2009   Irritable bowel syndrome 06/14/2009   INSOMNIA UNSPECIFIED 06/14/2009   Osteoporosis 08/27/2007   Past Medical History:  Diagnosis Date   Anxiety    Arthritis 2008   Asthma 1953   age 70   Breast cancer (HCC) dx'd 2017   right   Cancer (HCC)    breast cancer   Chronic vaginitis 1998   Fibromyalgia 1982   GERD (gastroesophageal reflux disease)    since at least 2011   Hemorrhoids 2009   History of radiation therapy 01/18/16-03/05/16   right breast/nodal areas 45 Gy, right breast boost 16 Gy   Hypoglycemia 2003   Hypothyroidism 1964   age 73   Interstitial cystitis 1998   Irritable bowel syndrome 06/14/2009   Qualifier:  Diagnosis of  By: Lennox Laity RN, Angelique Blonder     Menopausal symptoms    Osteoporosis 12/24/2006   Personal history of chemotherapy    Personal history of radiation therapy    Pneumonia 1979   varicella   PONV (postoperative nausea and vomiting) 1979    Family History  Problem Relation Age of Onset   Hypertension Mother    Heart disease Mother        d. 38   Parkinsonism Father        d. 56   Heart disease Sister    Breast cancer Sister 37       s/p mastectomy; reportedly negative BRCA1/2 testing approx 7 years ago   Melanoma Sister        d. 87s; hx of incresed sun exposure   Cancer Sister        dx. sarcoma of her face in her 83s; treated w/ MOHS surgery   Parkinsonism Paternal Aunt        d. 90s   Parkinsonism Paternal Uncle        d. late 28s   Kidney failure Maternal Grandmother 46   Heart attack Maternal Grandfather        d. 81s   Heart disease Paternal Grandmother        d. 60   Breast cancer Other 28       maternal great aunt (MGF's sister)   Kidney cancer Paternal Uncle 94       d. 76s; unsure about smoking status    Past Surgical History:  Procedure Laterality Date   BLADDER HYPEREXTENSION     X4, prior to 01/2006   BLADDER SUSPENSION     BREAST BIOPSY     BREAST LUMPECTOMY Right    2017   BREAST LUMPECTOMY WITH NEEDLE LOCALIZATION AND AXILLARY LYMPH NODE DISSECTION Right 12/05/2015   Procedure: RIGHT BREAST NEEDLE LOCALIZED (WIRES X 2) LUMPECTOMY WITH AXILLARY LYMPH NODE DISSECTION;  Surgeon: Claud Kelp, MD;  Location: [facility] OR;  Service: General;  Laterality: Right;   BREAST LUMPECTOMY WITH RADIOACTIVE SEED LOCALIZATION Left 12/05/2015   Procedure: RADIOACTIVE SEED GUIDED  LEFT BREAST LUMPECTOMY;  Surgeon: Claud Kelp, MD;  Location: [facility] OR;  Service: General;  Laterality: Left;   BREAST SURGERY     BUNIONECTOMY     prior to 01/2006   CARDIAC CATHETERIZATION     CESAREAN SECTION  1979   CHOLECYSTECTOMY  02/08/2006   DR. INGRAM   COLONOSCOPY     CORONARY  STENT INTERVENTION N/A 01/01/2017   Procedure: CORONARY STENT INTERVENTION;  Surgeon: Corky Crafts, MD;  Location: [facility] INVASIVE CV LAB;  Service: Cardiovascular;  Laterality: N/A;   DENTAL SURGERY  2009-2016   13 dental implants   LEFT HEART CATH AND CORONARY ANGIOGRAPHY N/A 01/01/2017   Procedure: LEFT HEART CATH AND CORONARY ANGIOGRAPHY;  Surgeon: Corky Crafts, MD;  Location: [facility] INVASIVE CV LAB;  Service: Cardiovascular;  Laterality: N/A;   LEG SURGERY  07/06/2004   Broken tibia   NASAL SINUS SURGERY     X4; prior to 01/2006   [facility] REMOVAL Right 12/05/2015   Procedure: REMOVAL PORT-A-CATH;  Surgeon: Claud Kelp, MD;  Location: [facility] OR;  Service: General;  Laterality: Right;   PORTACATH PLACEMENT Left 09/08/2015   Procedure: INSERTION OF PORT-A-CATH ;  Surgeon: Claud Kelp, MD;  Location: [facility] OR;  Service: General;  Laterality: Left;   Social History   Occupational History   Not on file  Tobacco Use   Smoking status: Former    Current packs/day: 0.25    Average packs/day: 0.3 packs/day for 15.0 years (3.8 ttl pk-yrs)    Types: Cigarettes   Smokeless tobacco: Never   Tobacco comments:    social smoker/"on and off"  Vaping Use   Vaping status: Never Used  Substance and Sexual Activity   Alcohol use: Yes    Alcohol/week: 0.0 standard drinks of alcohol    Comment: social   Drug use: No   Sexual activity: Not Currently    Birth control/protection: Post-menopausal

## 2023-04-03 ENCOUNTER — Telehealth: Payer: Self-pay | Admitting: Physical Medicine and Rehabilitation

## 2023-04-03 NOTE — Telephone Encounter (Signed)
Patient called and wants to make an appointment for the shots in her back. CB#[phone]

## 2023-04-05 ENCOUNTER — Other Ambulatory Visit: Payer: Self-pay | Admitting: Physical Medicine and Rehabilitation

## 2023-04-05 DIAGNOSIS — M5416 Radiculopathy, lumbar region: Secondary | ICD-10-CM

## 2023-04-15 ENCOUNTER — Other Ambulatory Visit: Payer: Self-pay | Admitting: Hematology and Oncology

## 2023-04-16 ENCOUNTER — Ambulatory Visit
Admission: RE | Admit: 2023-04-16 | Discharge: 2023-04-16 | Disposition: A | Discharge status: Home / Self Care | Payer: Medicare Other | Source: Ambulatory Visit | Attending: Adult Health | Admitting: Adult Health

## 2023-04-16 DIAGNOSIS — R921 Mammographic calcification found on diagnostic imaging of breast: Secondary | ICD-10-CM

## 2023-04-17 ENCOUNTER — Other Ambulatory Visit: Payer: Self-pay

## 2023-04-17 ENCOUNTER — Ambulatory Visit: Payer: Medicare Other | Admitting: Physical Medicine and Rehabilitation

## 2023-04-17 VITALS — BP 138/84 | HR 68

## 2023-04-17 DIAGNOSIS — M419 Scoliosis, unspecified: Secondary | ICD-10-CM

## 2023-04-17 DIAGNOSIS — M797 Fibromyalgia: Secondary | ICD-10-CM

## 2023-04-17 DIAGNOSIS — M48062 Spinal stenosis, lumbar region with neurogenic claudication: Secondary | ICD-10-CM | POA: Diagnosis not present

## 2023-04-17 DIAGNOSIS — M5416 Radiculopathy, lumbar region: Secondary | ICD-10-CM

## 2023-04-17 DIAGNOSIS — G894 Chronic pain syndrome: Secondary | ICD-10-CM | POA: Diagnosis not present

## 2023-04-17 MED ORDER — METHYLPREDNISOLONE ACETATE 40 MG/ML IJ SUSP
40.0000 mg | Freq: Once | INTRAMUSCULAR | Status: AC
  Administered 2023-04-17: 40 mg

## 2023-04-17 NOTE — Progress Notes (Signed)
Has driver Brilinta thinner has taken this morning No contrast allergy BP 138/84 P 67 Bilat L 5 TF

## 2023-04-17 NOTE — Patient Instructions (Signed)

## 2023-04-28 ENCOUNTER — Encounter: Payer: Self-pay | Admitting: Physical Medicine and Rehabilitation

## 2023-04-28 NOTE — Procedures (Signed)
Lumbosacral Transforaminal Epidural Steroid Injection - Sub-Pedicular Approach with Fluoroscopic Guidance  Patient: [name]      Date of Birth: 1946-02-16 MRN: [id number] PCP: Renford Dills, MD      Visit Date: 04/17/2023   Universal Protocol:    Date/Time: 04/17/2023  Consent Given By: the patient  Position: PRONE  Additional Comments: Vital signs were monitored before and after the procedure. Patient was prepped and draped in the usual sterile fashion. The correct patient, procedure, and site was verified.   Injection Procedure Details:   Procedure diagnoses: Lumbar radiculopathy [M54.16]    Meds Administered:  Meds ordered this encounter  Medications   methylPREDNISolone acetate (DEPO-MEDROL) injection 40 mg    Laterality: Bilateral  Location/Site: L5  Needle:5.0 in., 22 ga.  Short bevel or Quincke spinal needle  Needle Placement: Transforaminal  Findings:    -Comments: Excellent flow of contrast along the nerve, nerve root and into the epidural space.  Procedure Details: After squaring off the end-plates to get a true AP view, the C-arm was positioned so that an oblique view of the foramen as noted above was visualized. The target area is just inferior to the "nose of the scotty dog" or sub pedicular. The soft tissues overlying this structure were infiltrated with 2-3 ml. of 1% Lidocaine without Epinephrine.  The spinal needle was inserted toward the target using a "trajectory" view along the fluoroscope beam.  Under AP and lateral visualization, the needle was advanced so it did not puncture dura and was located close the 6 O'Clock position of the pedical in AP tracterory. Biplanar projections were used to confirm position. Aspiration was confirmed to be negative for CSF and/or blood. A 1-2 ml. volume of Isovue-250 was injected and flow of contrast was noted at each level. Radiographs were obtained for documentation purposes.   After attaining the desired  flow of contrast documented above, a 0.5 to 1.0 ml test dose of 0.25% Marcaine was injected into each respective transforaminal space.  The patient was observed for 90 seconds post injection.  After no sensory deficits were reported, and normal lower extremity motor function was noted,   the above injectate was administered so that equal amounts of the injectate were placed at each foramen (level) into the transforaminal epidural space.   Additional Comments:  The patient tolerated the procedure well Dressing: 2 x 2 sterile gauze and Band-Aid    Post-procedure details: Patient was observed during the procedure. Post-procedure instructions were reviewed.  Patient left the clinic in stable condition.

## 2023-04-28 NOTE — Progress Notes (Signed)
CAMPBELL DEALE - 77 y.o. female MRN [id number]  Date of birth: 11-Feb-1946  Office Visit Note: Visit Date: 04/17/2023 PCP: Renford Dills, MD Referred by: Renford Dills, MD  Subjective: Chief Complaint  Patient presents with   Lower Back - Pain   HPI: [name] is a 77 y.o. female who comes in today for chronic and worsening and severe low back pain with bilateral radicular leg pain.  I last saw her in July and completed bilateral L5 transforaminal epidural steroid injection with decent relief of pain for several months and then return of symptoms.  Since we have last seen her she has not had any falls or trauma.  She complains of progressive worsening hip and leg pain now to the point of severity and limiting activities of daily living and ambulation.  She has known degenerative scoliosis change with lateral recess and foraminal narrowing but without high-grade central stenosis.  Her case is complicated by long-term fibromyalgia and chronic pain syndrome.  She takes oxycodone 10 mg 4 times daily along with lorazepam and gabapentin and Soma.  She has most of her medications by her primary care physician Dr. Nehemiah Settle and some of her medications through Dr. Dierdre Forth in rheumatology.  Despite these medications she really is in quite a bit of pain.  Her case has also been complicated more recently with adjustment disorder which she is currently having treatment for since her husband died.  She reports continued orthopedic complaints with neck and shoulder pain for which she is followed up with Dr. Aldean Baker.  Her main orthopedic doctor in the office is Dr. Lajoyce Corners and we see her off and on for her back.   I spent more than 30 minutes speaking face-to-face with the patient with 50% of the time in counseling and discussing coordination of care.      Review of Systems  Constitutional:  Positive for malaise/fatigue.  Musculoskeletal:  Positive for back pain and joint pain. Negative for neck pain.   Neurological:  Positive for tingling and weakness.  All other systems reviewed and are negative.  Otherwise per HPI.  Assessment & Plan: Visit Diagnoses:    ICD-10-CM   1. Lumbar radiculopathy  M54.16 XR C-ARM NO REPORT    Epidural Steroid injection    methylPREDNISolone acetate (DEPO-MEDROL) injection 40 mg    2. Spinal stenosis of lumbar region with neurogenic claudication  M48.062     3. Scoliosis of lumbar spine, unspecified scoliosis type  M41.9     4. Chronic pain syndrome  G89.4     5. Fibromyalgia  M79.7        Plan: No additional findings.   Meds & Orders:  Meds ordered this encounter  Medications   methylPREDNISolone acetate (DEPO-MEDROL) injection 40 mg    Orders Placed This Encounter  Procedures   XR C-ARM NO REPORT   Epidural Steroid injection    Follow-up: Return if symptoms worsen or fail to improve.   Procedures: No procedures performed  Lumbosacral Transforaminal Epidural Steroid Injection - Sub-Pedicular Approach with Fluoroscopic Guidance  Patient: [name]      Date of Birth: August 11, 1945 MRN: [id number] PCP: Renford Dills, MD      Visit Date: 04/17/2023   Universal Protocol:    Date/Time: 04/17/2023  Consent Given By: the patient  Position: PRONE  Additional Comments: Vital signs were monitored before and after the procedure. Patient was prepped and draped in the usual sterile fashion. The correct patient, procedure, and site  was verified.   Injection Procedure Details:   Procedure diagnoses: Lumbar radiculopathy [M54.16]    Meds Administered:  Meds ordered this encounter  Medications   methylPREDNISolone acetate (DEPO-MEDROL) injection 40 mg    Laterality: Bilateral  Location/Site: L5  Needle:5.0 in., 22 ga.  Short bevel or Quincke spinal needle  Needle Placement: Transforaminal  Findings:    -Comments: Excellent flow of contrast along the nerve, nerve root and into the epidural space.  Procedure  Details: After squaring off the end-plates to get a true AP view, the C-arm was positioned so that an oblique view of the foramen as noted above was visualized. The target area is just inferior to the "nose of the scotty dog" or sub pedicular. The soft tissues overlying this structure were infiltrated with 2-3 ml. of 1% Lidocaine without Epinephrine.  The spinal needle was inserted toward the target using a "trajectory" view along the fluoroscope beam.  Under AP and lateral visualization, the needle was advanced so it did not puncture dura and was located close the 6 O'Clock position of the pedical in AP tracterory. Biplanar projections were used to confirm position. Aspiration was confirmed to be negative for CSF and/or blood. A 1-2 ml. volume of Isovue-250 was injected and flow of contrast was noted at each level. Radiographs were obtained for documentation purposes.   After attaining the desired flow of contrast documented above, a 0.5 to 1.0 ml test dose of 0.25% Marcaine was injected into each respective transforaminal space.  The patient was observed for 90 seconds post injection.  After no sensory deficits were reported, and normal lower extremity motor function was noted,   the above injectate was administered so that equal amounts of the injectate were placed at each foramen (level) into the transforaminal epidural space.   Additional Comments:  The patient tolerated the procedure well Dressing: 2 x 2 sterile gauze and Band-Aid    Post-procedure details: Patient was observed during the procedure. Post-procedure instructions were reviewed.  Patient left the clinic in stable condition.    Clinical History: MRI LUMBAR SPINE WITHOUT CONTRAST     TECHNIQUE:  Multiplanar, multisequence MR imaging of the lumbar spine was  performed. No intravenous contrast was administered.     COMPARISON:  01/29/2014     FINDINGS:  Segmentation:  5 lumbar type vertebrae     Alignment:  Levoscoliosis. Grade 1 anterolisthesis at L4-5. Mild  retrolisthesis at L1-2 and L2-3. Misalignment has progressed from  2015.     Vertebrae:  No fracture, evidence of discitis, or bone lesion.     Conus medullaris and cauda equina: Conus extends to the L1-2 level.  Conus and cauda equina appear normal. 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     Disc levels:     T12- L1: Disc narrowing and ventral spurring.  No impingement     L1-L2: Rightward disc collapse with far-lateral bulging and ridging.  Asymmetric right facet spurring. Moderate right foraminal narrowing.  Noncompressive right subarticular recess stenosis.     L2-L3: Disc narrowing and bulging with retrolisthesis. Mild facet  spurring. Mild to moderate left foraminal narrowing.     L3-L4: Disc narrowing and left eccentric bulging. Degenerative facet  spurring asymmetric to the left. Mild left subarticular recess  stenosis.     L4-L5: Facet osteoarthritis with spurring and anterolisthesis.  Moderate disc space narrowing with mild bulging. Bilateral root  sleeve cysts. Mild to moderate spinal stenosis with asymmetric left  subarticular recess narrowing     L5-S1:Disc narrowing and minor bulging. Mild facet spurring. No  neural compression.     IMPRESSION:  1. 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  2. Mild to moderate foraminal narrowing on the right at L1-2 and  left at L2-3.  3. Mild to moderate spinal stenosis at L4-5 with asymmetric left  subarticular recess narrowing.        Electronically Signed    By: Marnee Spring M.D.    On: 05/08/2020 07:15   She reports that she has quit smoking. Her smoking use included cigarettes. She has a 3.8 pack-year smoking history. She has never used smokeless tobacco. No results for input(s): "HGBA1C", "LABURIC" in the last 8760  hours.  Objective:  VS:  HT:    WT:   BMI:     BP:138/84  HR:68bpm  TEMP: ( )  RESP:  Physical Exam Vitals and nursing note reviewed.  Constitutional:      General: She is not in acute distress.    Appearance: Normal appearance. She is well-developed. She is not ill-appearing.  HENT:     Head: Normocephalic and atraumatic.     Right Ear: External ear normal.     Left Ear: External ear normal.  Eyes:     Extraocular Movements: Extraocular movements intact.     Conjunctiva/sclera: Conjunctivae normal.     Pupils: Pupils are equal, round, and reactive to light.  Cardiovascular:     Rate and Rhythm: Normal rate.     Pulses: Normal pulses.  Pulmonary:     Effort: Pulmonary effort is normal. No respiratory distress.  Abdominal:     General: There is no distension.     Palpations: Abdomen is soft.  Musculoskeletal:        General: Tenderness present.     Cervical back: Neck supple.     Right lower leg: No edema.     Left lower leg: No edema.     Comments: Patient has good distal strength with no pain over the greater trochanters.  No clonus or focal weakness.  Skin:    General: Skin is warm and dry.     Findings: No erythema, lesion or rash.  Neurological:     General: No focal deficit present.     Mental Status: She is alert and oriented to person, place, and time.     Cranial Nerves: No cranial nerve deficit.     Sensory: No sensory deficit.     Motor: No weakness or abnormal muscle tone.     Coordination: Coordination normal.     Gait: Gait abnormal.  Psychiatric:        Mood and Affect: Mood normal.        Behavior: Behavior normal.     Ortho Exam  Imaging: No results found.  Past Medical/Family/Surgical/Social History: Medications & Allergies reviewed per EMR, new medications updated. 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   Status post coronary artery stent placement    Non-ST elevation (NSTEMI) myocardial infarction [facility])    Depression with anxiety 12/31/2016   Chest pain 12/31/2016   Hyponatremia 12/31/2016   Chronic pain 12/31/2016   Essential hypertension 12/31/2016   Unstable angina (HCC)    Family history of breast cancer in female 02/08/2016   Rash and nonspecific skin eruption 10/03/2015   Sensorineural hearing loss (SNHL) of both ears 09/05/2015   Malignant neoplasm of upper-outer quadrant of right breast in female, estrogen receptor positive (HCC) 09/01/2015   Sclerosing adenosis of left breast 09/01/2015   Asthma 09/12/2011   Chronic vaginitis    Fibromyalgia    Interstitial cystitis    UNSPECIFIED HYPOTHYROIDISM 06/14/2009   DEPRESSIVE DISORDER NOT ELSEWHERE CLASSIFIED 06/14/2009   Hereditary and idiopathic peripheral neuropathy 06/14/2009   UNSPEC HEMORRHOIDS WITHOUT MENTION COMPLICATION 06/14/2009   ALLERGIC RHINITIS CAUSE UNSPECIFIED 06/14/2009   ESOPHAGEAL REFLUX 06/14/2009   Irritable bowel syndrome 06/14/2009   INSOMNIA UNSPECIFIED 06/14/2009   Osteoporosis 08/27/2007   Past Medical History:  Diagnosis Date   Anxiety    Arthritis 2008   Asthma 1953   age 63   Breast cancer (HCC) dx'd 2017   right   Cancer (HCC)    breast cancer   Chronic vaginitis 1998   Fibromyalgia 1982   GERD (gastroesophageal reflux disease)    since at least 2011   Hemorrhoids 2009   History of radiation therapy 01/18/16-03/05/16   right breast/nodal areas 45 Gy, right breast boost 16 Gy   Hypoglycemia 2003   Hypothyroidism 1964   age 34    Interstitial cystitis 1998   Irritable bowel syndrome 06/14/2009   Qualifier: Diagnosis of  By: Lennox Laity RN, Angelique Blonder     Menopausal symptoms    Osteoporosis 12/24/2006   Personal history of chemotherapy    Personal history of radiation therapy    Pneumonia 1979   varicella   PONV (postoperative nausea and vomiting) 1979   Family History  Problem Relation Age of Onset   Hypertension Mother    Heart disease Mother        d. 81   Parkinsonism Father        d. 33   Heart disease Sister    Breast cancer Sister 24       s/p mastectomy; reportedly negative BRCA1/2 testing approx 7 years ago   Melanoma Sister        d. 27s; hx of incresed sun exposure   Cancer Sister        dx. sarcoma of her face in her 61s; treated w/ MOHS surgery   Parkinsonism Paternal Aunt        d. 90s   Parkinsonism Paternal Uncle        d. late 3s   Kidney failure Maternal Grandmother 46   Heart attack Maternal Grandfather        d. 78s   Heart disease Paternal Grandmother        d. 60   Breast cancer Other 34       maternal great aunt (MGF's sister)   Kidney cancer Paternal Uncle 87       d. 73s; unsure about smoking status   Past Surgical History:  Procedure Laterality Date   BLADDER HYPEREXTENSION     X4, prior to 01/2006   BLADDER SUSPENSION     BREAST BIOPSY     BREAST LUMPECTOMY Right    2017   BREAST LUMPECTOMY  WITH NEEDLE LOCALIZATION AND AXILLARY LYMPH NODE DISSECTION Right 12/05/2015   Procedure: RIGHT BREAST NEEDLE LOCALIZED (WIRES X 2) LUMPECTOMY WITH AXILLARY LYMPH NODE DISSECTION;  Surgeon: Claud Kelp, MD;  Location: [facility] OR;  Service: General;  Laterality: Right;   BREAST LUMPECTOMY WITH RADIOACTIVE SEED LOCALIZATION Left 12/05/2015   Procedure: RADIOACTIVE SEED GUIDED LEFT BREAST LUMPECTOMY;  Surgeon: Claud Kelp, MD;  Location: [facility] OR;  Service: General;  Laterality: Left;   BREAST SURGERY     BUNIONECTOMY     prior to 01/2006   CARDIAC CATHETERIZATION     CESAREAN SECTION   1979   CHOLECYSTECTOMY  02/08/2006   DR. INGRAM   COLONOSCOPY     CORONARY STENT INTERVENTION N/A 01/01/2017   Procedure: CORONARY STENT INTERVENTION;  Surgeon: Corky Crafts, MD;  Location: [facility] INVASIVE CV LAB;  Service: Cardiovascular;  Laterality: N/A;   DENTAL SURGERY  2009-2016   13 dental implants   LEFT HEART CATH AND CORONARY ANGIOGRAPHY N/A 01/01/2017   Procedure: LEFT HEART CATH AND CORONARY ANGIOGRAPHY;  Surgeon: Corky Crafts, MD;  Location: [facility] INVASIVE CV LAB;  Service: Cardiovascular;  Laterality: N/A;   LEG SURGERY  07/06/2004   Broken tibia   NASAL SINUS SURGERY     X4; prior to 01/2006   [facility] REMOVAL Right 12/05/2015   Procedure: REMOVAL PORT-A-CATH;  Surgeon: Claud Kelp, MD;  Location: [facility] OR;  Service: General;  Laterality: Right;   PORTACATH PLACEMENT Left 09/08/2015   Procedure: INSERTION OF PORT-A-CATH ;  Surgeon: Claud Kelp, MD;  Location: [facility] OR;  Service: General;  Laterality: Left;   Social History   Occupational History   Not on file  Tobacco Use   Smoking status: Former    Current packs/day: 0.25    Average packs/day: 0.3 packs/day for 15.0 years (3.8 ttl pk-yrs)    Types: Cigarettes   Smokeless tobacco: Never   Tobacco comments:    social smoker/"on and off"  Vaping Use   Vaping status: Never Used  Substance and Sexual Activity   Alcohol use: Yes    Alcohol/week: 0.0 standard drinks of alcohol    Comment: social   Drug use: No   Sexual activity: Not Currently    Birth control/protection: Post-menopausal

## 2023-05-09 ENCOUNTER — Other Ambulatory Visit: Payer: Self-pay | Admitting: Interventional Cardiology

## 2023-06-06 ENCOUNTER — Other Ambulatory Visit: Payer: Self-pay | Admitting: Interventional Cardiology

## 2023-06-21 NOTE — Progress Notes (Signed)
 Cardiology Office Note:   Date:  06/26/2023  ID:  [name], DOB 19-Apr-1946, MRN [id number] PCP:  Renford Dills, MD  [facility] Providers Cardiologist:  Alverda Skeans, MD Referring MD: Renford Dills, MD  Chief Complaint/Reason for Referral: Follow-up for coronary artery disease ASSESSMENT:    1. ACS (acute coronary syndrome) (HCC)   2. Hyperlipidemia LDL goal <55   3. Aortic atherosclerosis (HCC)   4. Essential hypertension   5. Chronic diastolic CHF (congestive heart failure) (HCC)   6. BMI 26.0-26.9,adult   7. Wheezing     PLAN:   In order of problems listed above: History of acute coronary syndrome: Continue ticagrelor 90 twice daily, rosuvastatin 40 mg daily, as needed nitroglycerin. Hyperlipidemia: Check lipid panel, LFTs, LP(a) next week Aortic atherosclerosis: Continue ticagrelor 90 mg twice daily, rosuvastatin 40 mg daily, and strict blood pressure control. Hypertension: Start Micardis 40 mg daily and check BMP next week. Chronic diastolic heart failure: Start losartan 12.5 mg daily, continue Lasix 20 mg as needed, start Jardiance 10 mg daily.  Consider spironolactone in the future. Elevated BMI: Diet and exercise therapy. Wheezing: Due to flu which she is recovering from.  I have asked her to reach out to her PCP on Monday if she is not feeling significantly improved.            Dispo:  Return in about 6 months (around 12/24/2023).      Medication Adjustments/Labs and Tests Ordered: Current medicines are reviewed at length with the patient today.  Concerns regarding medicines are outlined above.  The following changes have been made:     Labs/tests ordered: Orders Placed This Encounter  Procedures   Lipoprotein A (LPA)   Lipid panel   Comprehensive metabolic panel   EKG 12-Lead    Medication Changes: Meds ordered this encounter  Medications   telmisartan (MICARDIS) 40 MG tablet    Sig: Take 1 tablet (40 mg total) by mouth daily.     Dispense:  90 tablet    Refill:  3    Current medicines are reviewed at length with the patient today.  The patient does not have concerns regarding medicines.  I spent 38 minutes reviewing all clinical data during and prior to this visit including all relevant imaging studies, laboratories, clinical information from other health systems and prior notes from both Cardiology and other specialties, interviewing the patient, conducting a complete physical examination, and coordinating care in order to formulate a comprehensive and personalized evaluation and treatment plan.   History of Present Illness:      FOCUSED PROBLEM LIST:   CAD ACS 2018 > DES circumflex, mid LAD, mid RCA, distal RCA Hyperlipidemia Intolerant of atorvastatin Aortic atherosclerosis Chest CT 2023 Hypertension Diastolic dysfunction G1 DD, EF 60 to 65%, no valve problems TTE 2018 Not a candidate for SGL 2 inhibitor due to UTIs. R Breast cancer Status post lumpectomy, chemotherapy, and XRT Hypothyroidism BMI 28  2/25: Patient consents to use of AI scribe.  The patient returns for routine follow-up.  She was seen last year and was doing well without angina or dyspnea.  She was thought to be euvolemic on exam.  Her blood pressure was mildly elevated above goal.    She has been experiencing an exacerbation of her asthma symptoms for the past week, which is atypical as she usually does not require daily use of her inhaler. This exacerbation coincided with the onset of flu-like symptoms, confirmed by a test. She has been managing  her symptoms by staying indoors, wearing a mask, maintaining distance, resting, and increasing fluid intake. She continues to experience body aches, cough, and wheezing, necessitating more frequent use of her inhaler.  Her past medical history is significant for a heart attack, and she is currently on Crestor for cholesterol management. She has experienced body aches from Lipitor in the past,  which exacerbated her fibromyalgia symptoms. She also has a history of cancer and is currently wearing a wig due to hair loss. Additionally, she has a history of anxiety, which developed during her husband's illness.  Her family history includes a daughter who is an infectious disease doctor and another daughter involved in health education. She has a strong family connection to [place].  She has a strong family connection to [place] .  No current issues with urinary tract infections or yeast infections.          Current Medications: Current Meds  Medication Sig   albuterol (PROVENTIL HFA;VENTOLIN HFA) 108 (90 Base) MCG/ACT inhaler Inhale 1-2 puffs into the lungs every 6 (six) hours as needed for wheezing or shortness of breath. Started in 1998   anastrozole (ARIMIDEX) 1 MG tablet Take 1 tablet (1 mg total) by mouth daily.   buPROPion (WELLBUTRIN XL) 300 MG 24 hr tablet Take 300 mg by mouth daily.    Calcium Carbonate-Vitamin D (CALCIUM + D PO) Take 1 tablet by mouth at bedtime. Started approximately 1998   carisoprodol (SOMA) 350 MG tablet Take 350 mg by mouth 2 (two) times daily as needed for muscle spasms. Started in 2008.   diazepam (VALIUM) 5 MG tablet Take 5 mg by mouth every 6 (six) hours as needed for muscle spasms (interstitial cystitis). Started approximately 2008.   furosemide (LASIX) 20 MG tablet TAKE 1 TABLET(20 MG) BY MOUTH DAILY AS NEEDED FOR SHORTNESS OF BREATH   gabapentin (NEURONTIN) 300 MG capsule TAKE 3 TO 4 CAPSULES(900 TO 1200 MG) BY MOUTH AT BEDTIME   HYDROcodone-acetaminophen (NORCO) 10-325 MG tablet Take 1 tablet by mouth every 4 (four) hours as needed for moderate pain or severe pain (as needed for fibromyalgia and IC).    hydrOXYzine (ATARAX/VISTARIL) 25 MG tablet Take 1 tablet by mouth at bedtime.   levothyroxine (SYNTHROID, LEVOTHROID) 75 MCG tablet Take 75 mcg by mouth daily.   LORazepam (ATIVAN) 1 MG tablet 1 tablet by mouth Once a day at bedtime as needed  for 30 days   metroNIDAZOLE (METROGEL) 0.75 % vaginal gel Place 1 Applicatorful vaginally at bedtime. Apply one applicatorful to vagina at bedtime for 5 days   nitroGLYCERIN (NITROSTAT) 0.4 MG SL tablet FOR CHEST PAIN 1 TABLET UNDER THE TONGUE WAIT 5 MINUTES AND IF STILL HAVING CHEST PAIN REPEAT 2 MORE TIME IF STILL PAIN CALL 911 OR GO TO ER   ondansetron (ZOFRAN) 4 MG tablet Take 1-2 tablets (4-8 mg total) by mouth every 8 (eight) hours as needed for nausea or vomiting.   pantoprazole (PROTONIX) 40 MG tablet Take 40 mg by mouth daily. Started in 2012.   potassium chloride (KLOR-CON) 10 MEQ tablet TAKE 1 TABLET BY MOUTH AS NEEDED. TAKE WITH LASIX   predniSONE (DELTASONE) 10 MG tablet Take 2 tablets (20 mg total) by mouth daily with breakfast.   promethazine (PHENERGAN) 25 MG tablet Take 0.5-1 tablets (12.5-25 mg total) by mouth every 6 (six) hours as needed for nausea or vomiting.   rosuvastatin (CRESTOR) 40 MG tablet TAKE 1 TABLET(40 MG) BY MOUTH DAILY   telmisartan (MICARDIS) 40 MG  tablet Take 1 tablet (40 mg total) by mouth daily.   ticagrelor (BRILINTA) 90 MG TABS tablet Take 1 tablet (90 mg total) by mouth 2 (two) times daily. Pt needs to keep upcoming appt in Feb for additional refills   Vaginal Lubricant (REPLENS VA) Place vaginally daily.   Vitamin D, Ergocalciferol, (DRISDOL) 1.25 MG (50000 UNIT) CAPS capsule Take 1 capsule (50,000 Units total) by mouth every 7 (seven) days.   zolpidem (AMBIEN) 10 MG tablet Take 10 mg by mouth at bedtime as needed for sleep.     Review of Systems:   Please see the history of present illness.    All other systems reviewed and are negative.     EKGs/Labs/Other Test Reviewed:   EKG: EKG from January 2024 demonstrates sinus rhythm  EKG Interpretation Date/Time:  Wednesday June 26 2023 14:14:05 EST Ventricular Rate:  88 PR Interval:  152 QRS Duration:  72 QT Interval:  356 QTC Calculation: 430 R Axis:   66  Text Interpretation: Normal sinus  rhythm Nonspecific ST abnormality When compared with ECG of 02-Jan-2017 06:46, Vent. rate has increased BY  32 BPM T wave inversion no longer evident in Inferior leads T wave inversion no longer evident in Anterolateral leads Confirmed by Alverda Skeans ([id number]) on 06/26/2023 2:22:32 PM         Risk Assessment/Calculations:          Physical Exam:   VS:  BP (!) 145/80   Pulse 88   Ht 5\' 2"  (1.575 m)   Wt 152 lb 3.2 oz (69 kg)   LMP 04/30/1996 (Approximate)   SpO2 90%   BMI 27.84 kg/m    HYPERTENSION CONTROL Vitals:   06/26/23 1410 06/26/23 1457  BP: (!) 144/68 (!) 145/80    The patient's blood pressure is elevated above target today.  In order to address the patient's elevated BP: A new medication was prescribed today.      Wt Readings from Last 3 Encounters:  06/26/23 152 lb 3.2 oz (69 kg)  01/17/23 148 lb 9.6 oz (67.4 kg)  07/16/22 145 lb 8 oz (66 kg)      GENERAL:  No apparent distress, AOx3 HEENT:  No carotid bruits, +2 carotid impulses, no scleral icterus CAR: RRR  no murmurs, gallops, rubs, or thrills RES: Diffuse end expiratory wheezes ABD:  Soft, nontender, nondistended, positive bowel sounds x 4 VASC:  +2 radial pulses, +2 carotid pulses NEURO:  CN 2-12 grossly intact; motor and sensory grossly intact PSYCH:  No active depression or anxiety EXT:  No edema, ecchymosis, or cyanosis  Signed, Orbie Pyo, MD  06/26/2023 3:01 PM    [facility] [address] [place], [place], [place]  [address] Phone: [phone]; Fax: [phone]   Note:  This document was prepared using Dragon voice recognition software and may include unintentional dictation errors.

## 2023-06-26 ENCOUNTER — Ambulatory Visit: Payer: Medicare Other | Attending: Internal Medicine | Admitting: Internal Medicine

## 2023-06-26 ENCOUNTER — Encounter: Payer: Self-pay | Admitting: Internal Medicine

## 2023-06-26 VITALS — BP 145/80 | HR 88 | Ht 62.0 in | Wt 152.2 lb

## 2023-06-26 DIAGNOSIS — E785 Hyperlipidemia, unspecified: Secondary | ICD-10-CM | POA: Insufficient documentation

## 2023-06-26 DIAGNOSIS — I10 Essential (primary) hypertension: Secondary | ICD-10-CM | POA: Insufficient documentation

## 2023-06-26 DIAGNOSIS — I700 Atherosclerosis of aorta: Secondary | ICD-10-CM | POA: Diagnosis present

## 2023-06-26 DIAGNOSIS — R062 Wheezing: Secondary | ICD-10-CM | POA: Diagnosis present

## 2023-06-26 DIAGNOSIS — I5032 Chronic diastolic (congestive) heart failure: Secondary | ICD-10-CM | POA: Insufficient documentation

## 2023-06-26 DIAGNOSIS — Z6826 Body mass index (BMI) 26.0-26.9, adult: Secondary | ICD-10-CM | POA: Diagnosis present

## 2023-06-26 DIAGNOSIS — I249 Acute ischemic heart disease, unspecified: Secondary | ICD-10-CM | POA: Diagnosis not present

## 2023-06-26 MED ORDER — TELMISARTAN 40 MG PO TABS: 40.0000 mg | ORAL_TABLET | Freq: Every day | ORAL | 3 refills | Status: AC

## 2023-06-26 NOTE — Patient Instructions (Addendum)
 Medication Instructions:  Your physician has recommended you make the following change in your medication:   1) START telmisartan (Micardis) 40 mg daily  *If you need a refill on your cardiac medications before your next appointment, please call your pharmacy*  Lab Work: Next week at [facility]: Lipid panel, LP(a), CMP You may go to any of these [facility] locations:   [facility] - [address] ([facility]) - [address] [phone] [address]  If you have labs (blood work) drawn today and your tests are completely normal, you will receive your results only by: [employer] (if you have MyChart) OR A paper copy in the mail If you have any lab test that is abnormal or we need to change your treatment, we will call you to review the results.  Follow-Up: At [facility], you and your health needs are our priority.  As part of our continuing mission to provide you with exceptional heart care, we have created designated Provider Care Teams.  These Care Teams include your primary Cardiologist (physician) and Advanced Practice Providers (APPs -  Physician Assistants and Nurse Practitioners) who all work together to provide you with the care you need, when you need it.  Your next appointment:   6 month(s)  The format for your next appointment:   In Person  Provider:   Tereso Newcomer, PA-C  Other Instructions   1st Floor: - Lobby - Registration  - Pharmacy  - Lab - Cafe  2nd Floor: - PV Lab - Diagnostic Testing (echo, CT, nuclear med)  3rd Floor: - Vacant  4th Floor: - TCTS (cardiothoracic surgery) - AFib Clinic - Structural Heart Clinic - Vascular Surgery  - Vascular Ultrasound  5th Floor: - HeartCare Cardiology (general and EP) - Clinical Pharmacy for coumadin, hypertension, lipid, weight-loss medications, and med management appointments    Valet parking services will be available as well.

## 2023-07-02 ENCOUNTER — Encounter: Payer: Self-pay | Admitting: Internal Medicine

## 2023-07-02 LAB — LIPOPROTEIN A (LPA): Lipoprotein (a): 94 nmol/L — ABNORMAL HIGH (ref <75.0)

## 2023-07-02 LAB — LIPID PANEL
Chol/HDL Ratio: 2.1 ratio (ref 0.0–4.4)
Cholesterol, Total: 168 mg/dL (ref 100–199)
HDL: 81 mg/dL (ref >39)
LDL Chol Calc (NIH): 71 mg/dL (ref 0–99)
Triglycerides: 85 mg/dL (ref 0–149)
VLDL Cholesterol Cal: 16 mg/dL (ref 5–40)

## 2023-07-02 LAB — COMPREHENSIVE METABOLIC PANEL
ALT: 44 IU/L — ABNORMAL HIGH (ref 0–32)
AST: 41 IU/L — ABNORMAL HIGH (ref 0–40)
Albumin: 4.3 g/dL (ref 3.8–4.8)
Alkaline Phosphatase: 134 IU/L — ABNORMAL HIGH (ref 44–121)
BUN/Creatinine Ratio: 15 (ref 12–28)
BUN: 13 mg/dL (ref 8–27)
Bilirubin Total: 0.6 mg/dL (ref 0.0–1.2)
CO2: 22 mmol/L (ref 20–29)
Calcium: 9.4 mg/dL (ref 8.7–10.3)
Chloride: 101 mmol/L (ref 96–106)
Creatinine, Ser: 0.85 mg/dL (ref 0.57–1.00)
Globulin, Total: 2.3 g/dL (ref 1.5–4.5)
Glucose: 109 mg/dL — ABNORMAL HIGH (ref 70–99)
Potassium: 4 mmol/L (ref 3.5–5.2)
Sodium: 140 mmol/L (ref 134–144)
Total Protein: 6.6 g/dL (ref 6.0–8.5)
eGFR: 70 mL/min/{1.73_m2} (ref >59)

## 2023-07-04 ENCOUNTER — Other Ambulatory Visit: Payer: Self-pay

## 2023-07-04 DIAGNOSIS — E785 Hyperlipidemia, unspecified: Secondary | ICD-10-CM

## 2023-07-10 ENCOUNTER — Telehealth: Payer: Self-pay | Admitting: Pharmacy Technician

## 2023-07-10 NOTE — Telephone Encounter (Signed)
 LVM reminding patient of appt next week. Asked patient if they would call back to complete study related questions over the phone.  Crystal Harlow Ohms BS [facility]  Clinical Research Specialist II Oncology Services  Direct Dial: [phone] Fax: [phone]   07/10/2023 9:31 AM

## 2023-07-11 ENCOUNTER — Other Ambulatory Visit: Payer: Self-pay | Admitting: *Deleted

## 2023-07-11 DIAGNOSIS — Z170 Estrogen receptor positive status [ER+]: Secondary | ICD-10-CM

## 2023-07-12 ENCOUNTER — Other Ambulatory Visit: Payer: Self-pay | Admitting: Internal Medicine

## 2023-07-12 NOTE — Telephone Encounter (Signed)
 AFT-05/PALLAS - month 33month/7 year follow up visit-  Called the patient to remind them of their appt on 07/16/23 at the cancer center to have labs drawn at 2:00 and see MD at 2:15. Asked patient study related questions   Anti-cancer medications - Patient states they contine to take anastrozole.   Serious adverse events - Patient denies any new, serious changes to medical history/medical diagnosis or medical conditions since last visit. No admissions to the ER or Urgent Care in the last year. No cardiac events. Patient did mention she had the flu for about 2 weeks in February.    COVID-19 - Patients reports no positive COVID-19 infections in the last year.  They have not had any COVID-19 vaccines in the last year.    Crystal Harlow Ohms BS [facility]  Clinical Research Specialist II Oncology Services  Direct Dial: [phone] Fax: [phone]   07/12/2023 2:20 PM

## 2023-07-16 ENCOUNTER — Inpatient Hospital Stay: Payer: Medicare Other

## 2023-07-16 ENCOUNTER — Inpatient Hospital Stay: Payer: Medicare Other | Attending: Hematology and Oncology | Admitting: Hematology and Oncology

## 2023-07-16 VITALS — BP 134/56 | HR 72 | Temp 97.4°F | Resp 17 | Ht 62.0 in | Wt 154.3 lb

## 2023-07-16 DIAGNOSIS — Z170 Estrogen receptor positive status [ER+]: Secondary | ICD-10-CM

## 2023-07-16 DIAGNOSIS — T451X5A Adverse effect of antineoplastic and immunosuppressive drugs, initial encounter: Secondary | ICD-10-CM | POA: Diagnosis not present

## 2023-07-16 DIAGNOSIS — Z853 Personal history of malignant neoplasm of breast: Secondary | ICD-10-CM | POA: Diagnosis present

## 2023-07-16 DIAGNOSIS — Z79899 Other long term (current) drug therapy: Secondary | ICD-10-CM | POA: Diagnosis not present

## 2023-07-16 DIAGNOSIS — C50411 Malignant neoplasm of upper-outer quadrant of right female breast: Secondary | ICD-10-CM

## 2023-07-16 DIAGNOSIS — N898 Other specified noninflammatory disorders of vagina: Secondary | ICD-10-CM | POA: Insufficient documentation

## 2023-07-16 DIAGNOSIS — I890 Lymphedema, not elsewhere classified: Secondary | ICD-10-CM | POA: Diagnosis not present

## 2023-07-16 DIAGNOSIS — G620 Drug-induced polyneuropathy: Secondary | ICD-10-CM | POA: Insufficient documentation

## 2023-07-16 LAB — RESEARCH LABS

## 2023-07-16 MED ORDER — ESTRADIOL 0.1 MG/GM VA CREA: 0.2500 | TOPICAL_CREAM | Freq: Every day | VAGINAL | 6 refills | Status: AC

## 2023-07-16 NOTE — Progress Notes (Signed)
 Patient Care Team: Renford Dills, MD as PCP - General (Internal Medicine) Orbie Pyo, MD as PCP - Cardiology (Cardiology) Anselmo Pickler, MD (Radiology) Donnetta Hail, MD as Consulting Physician (Rheumatology) Jamison Neighbor, MD (Urology) Cherlyn Roberts, MD as Consulting Physician (Dermatology) Maris Berger, MD as Consulting Physician (Ophthalmology) Tyrell Antonio, MD as Consulting Physician (Physical Medicine and Rehabilitation) Jerene Bears, MD as Consulting Physician (Gynecology) Basilio Cairo, MD (Dermatology)  DIAGNOSIS:  Encounter Diagnosis  Name Primary?   Malignant neoplasm of upper-outer quadrant of right breast in female, estrogen receptor positive (HCC) Yes    SUMMARY OF ONCOLOGIC HISTORY: Oncology History  Malignant neoplasm of upper-outer quadrant of right breast in female, estrogen receptor positive (HCC)  08/23/2015 Initial Biopsy   Right breast upper outer quadrant and lymph node biposy: IDC, grade 2, ER+, PR 5% +, Ki-67 20-30%, HER-2 negative.    09/01/2015 Initial Diagnosis   Malignant neoplasm of upper-outer quadrant of right breast in female, estrogen receptor positive (HCC)   09/12/2015 - 11/14/2015 Neo-Adjuvant Chemotherapy   Docetaxel and Cyclophosphamide x 4   09/13/2015 Initial Biopsy   Satelite nodule biopsy: IDC, grade 2, identical prognostic panel to previous biopsy   12/05/2015 Surgery   Right lumpectomy with ALND Derrell Lolling): IDC grade 2, 1.5cm, DCIS, + lymphovascular invasion, 15/17 SLN positive   01/18/2016 - 03/05/2016 Radiation Therapy   Adjuvant radiation with capecitabine ([place]): 1) Right breast/nodal areas: 45 Gy in 25 fractions  2) Right breast boost: 16 Gy in 8 fractions   02/07/2016 Genetic Testing   Genetic testing was normal, and did not reveal a deleterious mutation.  Additionally, no variants of uncertain significance (VUSes) were found.  Genes tested include: ATM, BARD1, BRCA1, BRCA2, BRIP1, CDH1, CHEK2, FANCC,  MLH1, MSH2, MSH6, NBN, PALB2, PMS2, PTEN, RAD51C, RAD51D, TP53, and XRCC2.  This panel also includes deletion/duplication analysis (without sequencing) for one gene, EPCAM.   06/01/2016 -  Anti-estrogen oral therapy   Anastrozole daily (on pallas)     CHIEF COMPLIANT: Follow-up on anastrozole  HISTORY OF PRESENT ILLNESS:   History of Present Illness [name], a breast cancer survivor, presents for her annual follow-up. She has completed seven years of hormone therapy and is interested in discontinuing the medication. She has been experiencing dryness, occasional bleeding, urinary tract infections, and yeast infections, which she attributes to the hormone therapy. She also has neuropathy, which she reports as a combination of pain and total numbness. This neuropathy is a side effect of her chemotherapy treatment, not a hereditary condition. [name] also has lymphedema, for which she wears a sleeve. She reports that the lymphedema can sometimes be painful. Additionally, [name] has fibromyalgia, which she has been managing for 43 years. She is a widow, her husband having passed away two years ago from dementia. Despite her medical conditions, [name] reports that she is living a good life. She stays active and busy in her independent living facility.     ALLERGIES:  is allergic to chlorhexidine and other.  MEDICATIONS:  Current Outpatient Medications  Medication Sig Dispense Refill   albuterol (PROVENTIL HFA;VENTOLIN HFA) 108 (90 Base) MCG/ACT inhaler Inhale 1-2 puffs into the lungs every 6 (six) hours as needed for wheezing or shortness of breath. Started in 1998     anastrozole (ARIMIDEX) 1 MG tablet Take 1 tablet (1 mg total) by mouth daily. 90 tablet 3   buPROPion (WELLBUTRIN XL) 300 MG 24 hr tablet Take 300 mg by mouth daily.  Calcium Carbonate-Vitamin D (CALCIUM + D PO) Take 1 tablet by mouth at bedtime. Started approximately 1998     carisoprodol (SOMA) 350 MG tablet Take 350 mg by mouth 2  (two) times daily as needed for muscle spasms. Started in 2008.  2   diazepam (VALIUM) 5 MG tablet Take 5 mg by mouth every 6 (six) hours as needed for muscle spasms (interstitial cystitis). Started approximately 2008.     furosemide (LASIX) 20 MG tablet TAKE 1 TABLET(20 MG) BY MOUTH DAILY AS NEEDED FOR SHORTNESS OF BREATH 30 tablet 5   gabapentin (NEURONTIN) 300 MG capsule TAKE 3 TO 4 CAPSULES(900 TO 1200 MG) BY MOUTH AT BEDTIME 120 capsule 4   HYDROcodone-acetaminophen (NORCO) 10-325 MG tablet Take 1 tablet by mouth every 4 (four) hours as needed for moderate pain or severe pain (as needed for fibromyalgia and IC).      hydrOXYzine (ATARAX/VISTARIL) 25 MG tablet Take 1 tablet by mouth at bedtime.     levothyroxine (SYNTHROID, LEVOTHROID) 75 MCG tablet Take 75 mcg by mouth daily.  2   LORazepam (ATIVAN) 1 MG tablet 1 tablet by mouth Once a day at bedtime as needed for 30 days     metroNIDAZOLE (METROGEL) 0.75 % vaginal gel Place 1 Applicatorful vaginally at bedtime. Apply one applicatorful to vagina at bedtime for 5 days 70 g 0   nitroGLYCERIN (NITROSTAT) 0.4 MG SL tablet FOR CHEST PAIN 1 TABLET UNDER THE TONGUE WAIT 5 MINUTES AND IF STILL HAVING CHEST PAIN REPEAT 2 MORE TIME IF STILL PAIN CALL 911 OR GO TO ER 25 tablet 6   ondansetron (ZOFRAN) 4 MG tablet Take 1-2 tablets (4-8 mg total) by mouth every 8 (eight) hours as needed for nausea or vomiting. 60 tablet 4   pantoprazole (PROTONIX) 40 MG tablet Take 40 mg by mouth daily. Started in 2012.     potassium chloride (KLOR-CON) 10 MEQ tablet TAKE 1 TABLET BY MOUTH AS NEEDED. TAKE WITH LASIX 30 tablet 5   predniSONE (DELTASONE) 10 MG tablet Take 2 tablets (20 mg total) by mouth daily with breakfast. 60 tablet 0   promethazine (PHENERGAN) 25 MG tablet Take 0.5-1 tablets (12.5-25 mg total) by mouth every 6 (six) hours as needed for nausea or vomiting. 90 tablet 4   rosuvastatin (CRESTOR) 40 MG tablet TAKE 1 TABLET(40 MG) BY MOUTH DAILY 90 tablet 0    telmisartan (MICARDIS) 40 MG tablet Take 1 tablet (40 mg total) by mouth daily. 90 tablet 3   ticagrelor (BRILINTA) 90 MG TABS tablet TAKE 1 TABLET(90 MG) BY MOUTH TWICE DAILY 60 tablet 5   Vaginal Lubricant (REPLENS VA) Place vaginally daily.     Vitamin D, Ergocalciferol, (DRISDOL) 1.25 MG (50000 UNIT) CAPS capsule Take 1 capsule (50,000 Units total) by mouth every 7 (seven) days. 12 capsule 2   zolpidem (AMBIEN) 10 MG tablet Take 10 mg by mouth at bedtime as needed for sleep.     No current facility-administered medications for this visit.    PHYSICAL EXAMINATION: ECOG PERFORMANCE STATUS: 1 - Symptomatic but completely ambulatory  There were no vitals filed for this visit. There were no vitals filed for this visit.  Physical Exam BREAST: Breasts normal on examination. SKIN: Sebaceous cyst on skin.  (exam performed in the presence of a chaperone)  LABORATORY DATA:  I have reviewed the data as listed    Latest Ref Rng & Units 07/01/2023    9:51 AM 01/04/2022   10:06 AM 07/11/2021  2:28 PM  CMP  Glucose 70 - 99 mg/dL 098  97  119   BUN 8 - 27 mg/dL 13  15  16    Creatinine 0.57 - 1.00 mg/dL 1.47  8.29  5.62   Sodium 134 - 144 mmol/L 140  138  137   Potassium 3.5 - 5.2 mmol/L 4.0  4.1  4.2   Chloride 96 - 106 mmol/L 101  105  104   CO2 20 - 29 mmol/L 22  27  24    Calcium 8.7 - 10.3 mg/dL 9.4  9.2  9.2   Total Protein 6.0 - 8.5 g/dL 6.6  7.0  7.1   Total Bilirubin 0.0 - 1.2 mg/dL 0.6  0.7  0.5   Alkaline Phos 44 - 121 IU/L 134  79  71   AST 0 - 40 IU/L 41  28  18   ALT 0 - 32 IU/L 44  24  14     Lab Results  Component Value Date   WBC 7.2 01/04/2022   HGB 13.7 01/04/2022   HCT 40.1 01/04/2022   MCV 94.1 01/04/2022   PLT 252 01/04/2022   NEUTROABS 4.4 01/04/2022    ASSESSMENT & PLAN:  Malignant neoplasm of upper-outer quadrant of right breast in female, estrogen receptor positive (HCC) 08/23/2015: Right breast T1N2 stage IIIa grade 2 IDC ER positive, PR 5%, Ki-67 20 to  30%, HER2 negative 08/24/15: Left breast biopsy: CSL 09/12/2015-11/14/2015: Neoadjuvant TC x4 12/05/2015: Right lumpectomy: T2N3 stage IIIc IDC grade 2 01/18/2016-03/15/2016: Adjuvant radiation with capecitabine (capecitabine maintenance completed January 2018)  Current treatment: Anastrozole started 06/01/2016 (PALLAS clinical trial: Randomized to anastrozole alone)  Anastrozole toxicities: Tolerating it well without any major problems or concerns. With this she concludes her 7 years of anastrozole therapy.  I recommended that she discontinue it at this time. Chemo-induced peripheral neuropathy   Breast cancer surveillance: 1.  Breast exam 07/16/2023: Benign 2. mammogram 04/16/2023: Stable probably benign calcifications at lumpectomy site favored benign fat necrosis, breast density category B     Assessment & Plan Malignant neoplasm of upper-outer quadrant of right breast, estrogen receptor positive Completed seven years of anastrozole therapy. Benefit expected to last 10-12 years post-cessation. Reassured her of persistent benefit despite concerns about relapse risk. Hormone therapy likely caused vaginal dryness, bleeding, UTIs, and yeast infections. Recent studies support estrogen cream use for quality of life improvement without increasing breast cancer risk. - Discontinue anastrozole. - Prescribe estrogen cream twice weekly with applicator.  Chemo-induced peripheral neuropathy Pain and numbness managed with gabapentin, which alleviates pain. - Continue gabapentin.  Lymphedema Managed effectively with compression garments. - Continue use of compression garments.      No orders of the defined types were placed in this encounter.  The patient has a good understanding of the overall plan. she agrees with it. she will call with any problems that may develop before the next visit here. Total time spent: 30 mins including face to face time and time spent for planning, charting and  co-ordination of care   Tamsen Meek, MD 07/16/23

## 2023-07-16 NOTE — Assessment & Plan Note (Signed)
 08/23/2015: Right breast T1N2 stage IIIa grade 2 IDC ER positive, PR 5%, Ki-67 20 to 30%, HER2 negative 08/24/15: Left breast biopsy: CSL 09/12/2015-11/14/2015: Neoadjuvant TC x4 12/05/2015: Right lumpectomy: T2N3 stage IIIc IDC grade 2 01/18/2016-03/15/2016: Adjuvant radiation with capecitabine (capecitabine maintenance completed January 2018) Current treatment: Anastrozole started 06/01/2016 (PALLAS clinical trial: Randomized to anastrozole alone) Anastrozole toxicities: Tolerating it well without any major problems or concerns.   Chemo-induced peripheral neuropathy   Breast cancer surveillance: 1.  Breast exam 07/16/2023: Benign 2. mammogram 04/16/2023: Stable probably benign calcifications at lumpectomy site favored benign fat necrosis, breast density category B   Return to clinic in 1 year for follow-up

## 2023-07-16 NOTE — Telephone Encounter (Addendum)
 PALLAS: PALBOCICLIB COLLABORATIVE ADJUVANT STUDY: A RANDOMIZED PHASE III TRIAL OF PALBOCICLIB WITH STANDARD ADJUVANT ENDOCRINE THERAPY VERSUS STANDARD ADJUVANT ENDOCRINE THERAPY ALONE FOR HORMONE RECEPTOR POSITIVE (HR+)/HUMAN EPIDERMAL GROWTH FACTOR RECEPTOR 2 (HER2)-NEGATIVE EARLY BREAST CANCER    AFT-05/PALLAS - 24month/7year follow up visit-      Research labs- The patient came into the cancer center to have optional labs drawn per protocol time point.  Disease monitoring - See MD note.The patient was examined Dr. Pamelia Hoit today and will see him again in 1 year for continued follow up. Most recent MMG 04/16/23.    Crystal Harlow Ohms BS [place] [facility]  Clinical Research Specialist II Oncology Services  Direct Dial: [phone] Fax: [phone]   07/16/2023 3:25 PM

## 2023-08-01 ENCOUNTER — Other Ambulatory Visit: Payer: Self-pay | Admitting: Physical Medicine and Rehabilitation

## 2023-08-01 ENCOUNTER — Telehealth: Payer: Self-pay | Admitting: Physical Medicine and Rehabilitation

## 2023-08-01 DIAGNOSIS — M797 Fibromyalgia: Secondary | ICD-10-CM

## 2023-08-01 DIAGNOSIS — M48062 Spinal stenosis, lumbar region with neurogenic claudication: Secondary | ICD-10-CM

## 2023-08-01 DIAGNOSIS — G894 Chronic pain syndrome: Secondary | ICD-10-CM

## 2023-08-01 DIAGNOSIS — M5416 Radiculopathy, lumbar region: Secondary | ICD-10-CM

## 2023-08-01 NOTE — Telephone Encounter (Signed)
 Patient called and wants to make an appointment for shot. CB#[phone]

## 2023-08-14 ENCOUNTER — Other Ambulatory Visit: Payer: Self-pay

## 2023-08-14 ENCOUNTER — Ambulatory Visit: Admitting: Physical Medicine and Rehabilitation

## 2023-08-14 VITALS — BP 124/72 | HR 68

## 2023-08-14 DIAGNOSIS — M5416 Radiculopathy, lumbar region: Secondary | ICD-10-CM | POA: Diagnosis not present

## 2023-08-14 MED ORDER — METHYLPREDNISOLONE ACETATE 40 MG/ML IJ SUSP: 40.0000 mg | Freq: Once | INTRAMUSCULAR | Status: DC

## 2023-08-14 NOTE — Progress Notes (Unsigned)
 [name] - 78 y.o. female MRN [id number]  Date of birth: February 08, 1946  Office Visit Note: Visit Date: 08/14/2023 PCP: Renford Dills, MD Referred by: Renford Dills, MD  Subjective: Chief Complaint  Patient presents with   Lower Back - Pain   HPI: [name] is a 78 y.o. female who comes in todayHPI   ROS Otherwise per HPI.  Assessment & Plan: Visit Diagnoses:    ICD-10-CM   1. Lumbar radiculopathy  M54.16 XR C-ARM NO REPORT    Epidural Steroid injection    methylPREDNISolone acetate (DEPO-MEDROL) injection 40 mg       Plan: No additional findings.   Meds & Orders:  Meds ordered this encounter  Medications   methylPREDNISolone acetate (DEPO-MEDROL) injection 40 mg    Orders Placed This Encounter  Procedures   XR C-ARM NO REPORT   Epidural Steroid injection    Follow-up: No follow-ups on file.   Procedures: No procedures performed      Clinical History: MRI LUMBAR SPINE WITHOUT CONTRAST     TECHNIQUE:  Multiplanar, multisequence MR imaging of the lumbar spine was  performed. No intravenous contrast was administered.     COMPARISON:  01/29/2014     FINDINGS:  Segmentation:  5 lumbar type vertebrae     Alignment: Levoscoliosis. Grade 1 anterolisthesis at L4-5. Mild  retrolisthesis at L1-2 and L2-3. Misalignment has progressed from  2015.     Vertebrae:  No fracture, evidence of discitis, or bone lesion.     Conus medullaris and cauda equina: Conus extends to the L1-2 level.  Conus and cauda equina appear normal. 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     Disc levels:     T12- L1: Disc narrowing and ventral spurring.  No impingement     L1-L2: Rightward disc collapse with far-lateral bulging and ridging.  Asymmetric right facet spurring. Moderate right foraminal narrowing.  Noncompressive right subarticular recess stenosis.     L2-L3: Disc narrowing and bulging  with retrolisthesis. Mild facet  spurring. Mild to moderate left foraminal narrowing.     L3-L4: Disc narrowing and left eccentric bulging. Degenerative facet  spurring asymmetric to the left. Mild left subarticular recess  stenosis.     L4-L5: Facet osteoarthritis with spurring and anterolisthesis.  Moderate disc space narrowing with mild bulging. Bilateral root  sleeve cysts. Mild to moderate spinal stenosis with asymmetric left  subarticular recess narrowing     L5-S1:Disc narrowing and minor bulging. Mild facet spurring. No  neural compression.     IMPRESSION:  1. 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  2. Mild to moderate foraminal narrowing on the right at L1-2 and  left at L2-3.  3. Mild to moderate spinal stenosis at L4-5 with asymmetric left  subarticular recess narrowing.        Electronically Signed    By: Marnee Spring M.D.    On: 05/08/2020 07:15   She reports that she has quit smoking. Her smoking use included cigarettes. She has a 3.8 pack-year smoking history. She has never used smokeless tobacco. No results for input(s): "HGBA1C", "LABURIC" in the last 8760 hours.  Objective:  VS:  HT:    WT:   BMI:     BP:124/72  HR:68bpm  TEMP: ( )  RESP:  Physical Exam  Ortho Exam  Imaging: No  results found.  Past Medical/Family/Surgical/Social History: Medications & Allergies reviewed per EMR, new medications updated. Patient Active Problem List   Diagnosis Date Noted   History of non-ST elevation myocardial infarction (NSTEMI) 05/15/2022   Caregiver stress 07/26/2020   Adjustment disorder with mixed anxiety and depressed mood 12/01/2019   Lumbar radiculopathy 05/11/2019   Bilateral stenosis of lateral recess of lumbar spine 05/11/2019   Hyperlipidemia 12/23/2018   Chronic diastolic CHF (congestive heart failure) (HCC) 11/24/2018   Abnormal transaminases 03/24/2018   CAD (coronary artery disease)  02/11/2017   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   Status post coronary artery stent placement    Non-ST elevation (NSTEMI) myocardial infarction [facility])    Depression with anxiety 12/31/2016   Chest pain 12/31/2016   Hyponatremia 12/31/2016   Chronic pain 12/31/2016   Essential hypertension 12/31/2016   Unstable angina (HCC)    Family history of breast cancer in female 02/08/2016   Rash and nonspecific skin eruption 10/03/2015   Sensorineural hearing loss (SNHL) of both ears 09/05/2015   Malignant neoplasm of upper-outer quadrant of right breast in female, estrogen receptor positive (HCC) 09/01/2015   Sclerosing adenosis of left breast 09/01/2015   Asthma 09/12/2011   Chronic vaginitis    Fibromyalgia    Interstitial cystitis    UNSPECIFIED HYPOTHYROIDISM 06/14/2009   DEPRESSIVE DISORDER NOT ELSEWHERE CLASSIFIED 06/14/2009   Hereditary and idiopathic peripheral neuropathy 06/14/2009   UNSPEC HEMORRHOIDS WITHOUT MENTION COMPLICATION 06/14/2009   ALLERGIC RHINITIS CAUSE UNSPECIFIED 06/14/2009   ESOPHAGEAL REFLUX 06/14/2009   Irritable bowel syndrome 06/14/2009   INSOMNIA UNSPECIFIED 06/14/2009   Osteoporosis 08/27/2007   Past Medical History:  Diagnosis Date   Anxiety    Arthritis 2008   Asthma 1953   age 20   Breast cancer (HCC) dx'd 2017   right   Cancer (HCC)    breast cancer   Chronic vaginitis 1998   Fibromyalgia 1982   GERD (gastroesophageal reflux disease)    since at least 2011   Hemorrhoids 2009   History of radiation therapy 01/18/16-03/05/16   right breast/nodal areas 45 Gy, right breast boost 16 Gy   Hypoglycemia 2003   Hypothyroidism 1964   age 71   Interstitial cystitis 1998   Irritable bowel syndrome 06/14/2009   Qualifier: Diagnosis of  By: Leroy Ranks RN, Tyra Galley     Menopausal symptoms    Osteoporosis 12/24/2006   Personal history of chemotherapy    Personal history of radiation therapy    Pneumonia 1979   varicella   PONV (postoperative nausea and  vomiting) 1979   Family History  Problem Relation Age of Onset   Hypertension Mother    Heart disease Mother        d. 55   Parkinsonism Father        d. 5   Heart disease Sister    Breast cancer Sister 56       s/p mastectomy; reportedly negative BRCA1/2 testing approx 7 years ago   Melanoma Sister        d. 11s; hx of incresed sun exposure   Cancer Sister        dx. sarcoma of her face in her 56s; treated w/ MOHS surgery   Parkinsonism Paternal Aunt        d. 90s   Parkinsonism Paternal Uncle        d. late 48s   Kidney failure Maternal Grandmother 46   Heart attack Maternal Grandfather        d.  70s   Heart disease Paternal Grandmother        d. 60   Breast cancer Other 64       maternal great aunt (MGF's sister)   Kidney cancer Paternal Uncle 32       d. 81s; unsure about smoking status   Past Surgical History:  Procedure Laterality Date   BLADDER HYPEREXTENSION     X4, prior to 01/2006   BLADDER SUSPENSION     BREAST BIOPSY     BREAST LUMPECTOMY Right    2017   BREAST LUMPECTOMY WITH NEEDLE LOCALIZATION AND AXILLARY LYMPH NODE DISSECTION Right 12/05/2015   Procedure: RIGHT BREAST NEEDLE LOCALIZED (WIRES X 2) LUMPECTOMY WITH AXILLARY LYMPH NODE DISSECTION;  Surgeon: Boyce Byes, MD;  Location: [facility] OR;  Service: General;  Laterality: Right;   BREAST LUMPECTOMY WITH RADIOACTIVE SEED LOCALIZATION Left 12/05/2015   Procedure: RADIOACTIVE SEED GUIDED LEFT BREAST LUMPECTOMY;  Surgeon: Boyce Byes, MD;  Location: [facility] OR;  Service: General;  Laterality: Left;   BREAST SURGERY     BUNIONECTOMY     prior to 01/2006   CARDIAC CATHETERIZATION     CESAREAN SECTION  1979   CHOLECYSTECTOMY  02/08/2006   DR. INGRAM   COLONOSCOPY     CORONARY STENT INTERVENTION N/A 01/01/2017   Procedure: CORONARY STENT INTERVENTION;  Surgeon: Lucendia Rusk, MD;  Location: [facility] INVASIVE CV LAB;  Service: Cardiovascular;  Laterality: N/A;   DENTAL SURGERY  2009-2016   13 dental implants    LEFT HEART CATH AND CORONARY ANGIOGRAPHY N/A 01/01/2017   Procedure: LEFT HEART CATH AND CORONARY ANGIOGRAPHY;  Surgeon: Lucendia Rusk, MD;  Location: [facility] INVASIVE CV LAB;  Service: Cardiovascular;  Laterality: N/A;   LEG SURGERY  07/06/2004   Broken tibia   NASAL SINUS SURGERY     X4; prior to 01/2006   [facility] REMOVAL Right 12/05/2015   Procedure: REMOVAL PORT-A-CATH;  Surgeon: Boyce Byes, MD;  Location: [facility] OR;  Service: General;  Laterality: Right;   PORTACATH PLACEMENT Left 09/08/2015   Procedure: INSERTION OF PORT-A-CATH ;  Surgeon: Boyce Byes, MD;  Location: [facility] OR;  Service: General;  Laterality: Left;   Social History   Occupational History   Not on file  Tobacco Use   Smoking status: Former    Current packs/day: 0.25    Average packs/day: 0.3 packs/day for 15.0 years (3.8 ttl pk-yrs)    Types: Cigarettes   Smokeless tobacco: Never   Tobacco comments:    social smoker/"on and off"  Vaping Use   Vaping status: Never Used  Substance and Sexual Activity   Alcohol use: Yes    Alcohol/week: 0.0 standard drinks of alcohol    Comment: social   Drug use: No   Sexual activity: Not Currently    Birth control/protection: Post-menopausal

## 2023-08-14 NOTE — Progress Notes (Unsigned)
 Pain Scale   Average Pain 8 Patient advising he lower back pain is constant and at time it radiates bilaterally to hips.        +Driver, -BT, -Dye Allergies.

## 2023-08-14 NOTE — Patient Instructions (Signed)

## 2023-08-15 NOTE — Procedures (Signed)
 Lumbosacral Transforaminal Epidural Steroid Injection - Sub-Pedicular Approach with Fluoroscopic Guidance  Patient: [name]      Date of Birth: 01-Jan-1946 MRN: [id number] PCP: Merl Star, MD      Visit Date: 08/14/2023   Universal Protocol:    Date/Time: 08/14/2023  Consent Given By: the patient  Position: PRONE  Additional Comments: Vital signs were monitored before and after the procedure. Patient was prepped and draped in the usual sterile fashion. The correct patient, procedure, and site was verified.   Injection Procedure Details:   Procedure diagnoses: Lumbar radiculopathy [M54.16]    Meds Administered:  Meds ordered this encounter  Medications   methylPREDNISolone acetate (DEPO-MEDROL) injection 40 mg    Laterality: Bilateral  Location/Site: L4  Needle:5.0 in., 22 ga.  Short bevel or Quincke spinal needle  Needle Placement: Transforaminal  Findings:    -Comments: Excellent flow of contrast along the nerve, nerve root and into the epidural space.  Procedure Details: After squaring off the end-plates to get a true AP view, the C-arm was positioned so that an oblique view of the foramen as noted above was visualized. The target area is just inferior to the "nose of the scotty dog" or sub pedicular. The soft tissues overlying this structure were infiltrated with 2-3 ml. of 1% Lidocaine without Epinephrine.  The spinal needle was inserted toward the target using a "trajectory" view along the fluoroscope beam.  Under AP and lateral visualization, the needle was advanced so it did not puncture dura and was located close the 6 O'Clock position of the pedical in AP tracterory. Biplanar projections were used to confirm position. Aspiration was confirmed to be negative for CSF and/or blood. A 1-2 ml. volume of Isovue-250 was injected and flow of contrast was noted at each level. Radiographs were obtained for documentation purposes.   After attaining the desired  flow of contrast documented above, a 0.5 to 1.0 ml test dose of 0.25% Marcaine was injected into each respective transforaminal space.  The patient was observed for 90 seconds post injection.  After no sensory deficits were reported, and normal lower extremity motor function was noted,   the above injectate was administered so that equal amounts of the injectate were placed at each foramen (level) into the transforaminal epidural space.   Additional Comments:  No complications occurred Dressing: 2 x 2 sterile gauze and Band-Aid    Post-procedure details: Patient was observed during the procedure. Post-procedure instructions were reviewed.  Patient left the clinic in stable condition.

## 2023-08-31 ENCOUNTER — Other Ambulatory Visit: Payer: Self-pay | Admitting: Internal Medicine

## 2023-09-12 ENCOUNTER — Ambulatory Visit (INDEPENDENT_AMBULATORY_CARE_PROVIDER_SITE_OTHER): Admitting: Orthopedic Surgery

## 2023-09-12 DIAGNOSIS — G8929 Other chronic pain: Secondary | ICD-10-CM | POA: Diagnosis not present

## 2023-09-12 DIAGNOSIS — M25511 Pain in right shoulder: Secondary | ICD-10-CM | POA: Diagnosis not present

## 2023-09-12 DIAGNOSIS — M5416 Radiculopathy, lumbar region: Secondary | ICD-10-CM | POA: Diagnosis not present

## 2023-09-12 MED ORDER — COLCHICINE 0.6 MG PO TABS: 0.6000 mg | ORAL_TABLET | Freq: Every day | ORAL | 3 refills | Status: AC

## 2023-09-12 MED ORDER — PREDNISONE 10 MG PO TABS: 20.0000 mg | ORAL_TABLET | Freq: Every day | ORAL | 0 refills | Status: DC

## 2023-09-13 ENCOUNTER — Encounter: Payer: Self-pay | Admitting: Orthopedic Surgery

## 2023-09-13 DIAGNOSIS — M25511 Pain in right shoulder: Secondary | ICD-10-CM

## 2023-09-13 DIAGNOSIS — G8929 Other chronic pain: Secondary | ICD-10-CM

## 2023-09-13 MED ORDER — METHYLPREDNISOLONE ACETATE 40 MG/ML IJ SUSP
40.0000 mg | INTRAMUSCULAR | Status: AC | PRN
  Administered 2023-09-13: 40 mg via INTRA_ARTICULAR

## 2023-09-13 MED ORDER — LIDOCAINE HCL 1 % IJ SOLN
5.0000 mL | INTRAMUSCULAR | Status: AC | PRN
  Administered 2023-09-13: 5 mL

## 2023-09-13 NOTE — Progress Notes (Signed)
 Office Visit Note   Patient: [name]           Date of Birth: August 18, 1945           MRN: [id number] Visit Date: 09/12/2023              Requested by: Merl Star, MD [address] [place],  [place] [address] PCP: Merl Star, MD  Chief Complaint  Patient presents with   Right Foot - Follow-up    Needs refill for prednisone    Left Foot - Follow-up      HPI: Patient is a 78 year old woman who presents with 2 separate issues.  She has persistent lower back pain with radicular symptoms.  She states she has had an injection with Dr. Daisey Dryer about a month ago.  Radicular pain down both lower extremities.  Patient states she has had an exacerbation of her right shoulder pain primarily anteriorly and laterally.  Assessment & Plan: Visit Diagnoses:  1. Lumbar radiculopathy   2. Chronic right shoulder pain     Plan: Right shoulder was injected she tolerated this well she is provided a prescription for prednisone  and colchicine  for her back symptoms.  Follow-Up Instructions: Return if symptoms worsen or fail to improve.   Ortho Exam  Patient is alert, oriented, no adenopathy, well-dressed, normal affect, normal respiratory effort. Examination patient has no focal motor weakness in either lower extremity.  Examination of the right shoulder she has pain with Neer and Hawkins impingement test.  Pain to palpation over the biceps tendon.  Imaging: No results found. No images are attached to the encounter.  Labs: Lab Results  Component Value Date   HGBA1C 5.4 11/04/2017   HGBA1C 5.2 05/29/2017   HGBA1C 4.9 01/03/2017   REPTSTATUS 01/05/2017 FINAL 01/03/2017   CULT (A) 01/03/2017    40,000 COLONIES/mL ESCHERICHIA COLI Confirmed Extended Spectrum Beta-Lactamase Producer (ESBL) 80,000 COLONIES/mL ENTEROCOCCUS FAECALIS    LABORGA ESCHERICHIA COLI (A) 01/03/2017   LABORGA ENTEROCOCCUS FAECALIS (A) 01/03/2017     Lab Results  Component Value Date    ALBUMIN 4.3 07/01/2023   ALBUMIN 4.0 01/04/2022   ALBUMIN 4.3 07/11/2021    No results found for: "MG" Lab Results  Component Value Date   VD25OH 34 03/21/2015   VD25OH 30 03/18/2014   VD25OH 58 02/06/2013    No results found for: "PREALBUMIN"    Latest Ref Rng & Units 01/04/2022   10:06 AM 07/11/2021    2:28 PM 01/11/2021    2:34 PM  CBC EXTENDED  WBC 4.0 - 10.5 K/uL 7.2  7.6  7.9   RBC 3.87 - 5.11 MIL/uL 4.26  4.54  4.38   Hemoglobin 12.0 - 15.0 g/dL 53.6  64.4  03.4   HCT 36.0 - 46.0 % 40.1  42.9  41.3   Platelets 150 - 400 K/uL 252  282  255   NEUT# 1.7 - 7.7 K/uL 4.4  5.5  5.6   Lymph# 0.7 - 4.0 K/uL 1.9  1.4  1.7      There is no height or weight on file to calculate BMI.  Orders:  No orders of the defined types were placed in this encounter.  Meds ordered this encounter  Medications   predniSONE  (DELTASONE ) 10 MG tablet    Sig: Take 2 tablets (20 mg total) by mouth daily with breakfast.    Dispense:  60 tablet    Refill:  0   colchicine  0.6 MG tablet  Sig: Take 1 tablet (0.6 mg total) by mouth daily.    Dispense:  90 tablet    Refill:  3     Procedures: Large Joint Inj: R subacromial bursa on 09/13/2023 3:02 PM Indications: diagnostic evaluation and pain Details: 22 G 1.5 in needle, posterior approach  Arthrogram: No  Medications: 5 mL lidocaine  1 %; 40 mg methylPREDNISolone  acetate 40 MG/ML Outcome: tolerated well, no immediate complications Procedure, treatment alternatives, risks and benefits explained, specific risks discussed. Consent was given by the patient. Immediately prior to procedure a time out was called to verify the correct patient, procedure, equipment, support staff and site/side marked as required. Patient was prepped and draped in the usual sterile fashion.      Clinical Data: No additional findings.  ROS:  All other systems negative, except as noted in the HPI. Review of Systems  Objective: Vital Signs: LMP 04/30/1996  (Approximate)   Specialty Comments:  MRI LUMBAR SPINE WITHOUT CONTRAST     TECHNIQUE:  Multiplanar, multisequence MR imaging of the lumbar spine was  performed. No intravenous contrast was administered.     COMPARISON:  01/29/2014     FINDINGS:  Segmentation:  5 lumbar type vertebrae     Alignment: Levoscoliosis. Grade 1 anterolisthesis at L4-5. Mild  retrolisthesis at L1-2 and L2-3. Misalignment has progressed from  2015.     Vertebrae:  No fracture, evidence of discitis, or bone lesion.     Conus medullaris and cauda equina: Conus extends to the L1-2 level.  Conus and cauda equina appear normal. 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     Disc levels:     T12- L1: Disc narrowing and ventral spurring.  No impingement     L1-L2: Rightward disc collapse with far-lateral bulging and ridging.  Asymmetric right facet spurring. Moderate right foraminal narrowing.  Noncompressive right subarticular recess stenosis.     L2-L3: Disc narrowing and bulging with retrolisthesis. Mild facet  spurring. Mild to moderate left foraminal narrowing.     L3-L4: Disc narrowing and left eccentric bulging. Degenerative facet  spurring asymmetric to the left. Mild left subarticular recess  stenosis.     L4-L5: Facet osteoarthritis with spurring and anterolisthesis.  Moderate disc space narrowing with mild bulging. Bilateral root  sleeve cysts. Mild to moderate spinal stenosis with asymmetric left  subarticular recess narrowing     L5-S1:Disc narrowing and minor bulging. Mild facet spurring. No  neural compression.     IMPRESSION:  1. 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  2. Mild to moderate foraminal narrowing on the right at L1-2 and  left at L2-3.  3. Mild to moderate spinal stenosis at L4-5 with asymmetric left  subarticular recess narrowing.         Electronically Signed    By: Aleta Hutch M.D.    On: 05/08/2020 07:15  PMFS History: Patient Active Problem List   Diagnosis Date Noted   History of non-ST elevation myocardial infarction (NSTEMI) 05/15/2022   Caregiver stress 07/26/2020   Adjustment disorder with mixed anxiety and depressed mood 12/01/2019   Lumbar radiculopathy 05/11/2019   Bilateral stenosis of lateral recess of lumbar spine 05/11/2019   Hyperlipidemia 12/23/2018   Chronic diastolic CHF (congestive heart failure) (HCC) 11/24/2018   Abnormal transaminases 03/24/2018   CAD (coronary artery disease) 02/11/2017   Female pattern hair  loss 01/21/2017   Status post coronary artery stent placement    Non-ST elevation (NSTEMI) myocardial infarction [facility])    Depression with anxiety 12/31/2016   Chest pain 12/31/2016   Hyponatremia 12/31/2016   Chronic pain 12/31/2016   Essential hypertension 12/31/2016   Unstable angina (HCC)    Family history of breast cancer in female 02/08/2016   Rash and nonspecific skin eruption 10/03/2015   Sensorineural hearing loss (SNHL) of both ears 09/05/2015   Malignant neoplasm of upper-outer quadrant of right breast in female, estrogen receptor positive (HCC) 09/01/2015   Sclerosing adenosis of left breast 09/01/2015   Asthma 09/12/2011   Chronic vaginitis    Fibromyalgia    Interstitial cystitis    UNSPECIFIED HYPOTHYROIDISM 06/14/2009   DEPRESSIVE DISORDER NOT ELSEWHERE CLASSIFIED 06/14/2009   Hereditary and idiopathic peripheral neuropathy 06/14/2009   UNSPEC HEMORRHOIDS WITHOUT MENTION COMPLICATION 06/14/2009   ALLERGIC RHINITIS CAUSE UNSPECIFIED 06/14/2009   ESOPHAGEAL REFLUX 06/14/2009   Irritable bowel syndrome 06/14/2009   INSOMNIA UNSPECIFIED 06/14/2009   Osteoporosis 08/27/2007   Past Medical History:  Diagnosis Date   Anxiety    Arthritis 2008   Asthma 1953   age 11   Breast cancer (HCC) dx'd 2017   right   Cancer (HCC)    breast cancer   Chronic vaginitis  1998   Fibromyalgia 1982   GERD (gastroesophageal reflux disease)    since at least 2011   Hemorrhoids 2009   History of radiation therapy 01/18/16-03/05/16   right breast/nodal areas 45 Gy, right breast boost 16 Gy   Hypoglycemia 2003   Hypothyroidism 1964   age 56   Interstitial cystitis 1998   Irritable bowel syndrome 06/14/2009   Qualifier: Diagnosis of  By: Leroy Ranks RN, Tyra Galley     Menopausal symptoms    Osteoporosis 12/24/2006   Personal history of chemotherapy    Personal history of radiation therapy    Pneumonia 1979   varicella   PONV (postoperative nausea and vomiting) 1979    Family History  Problem Relation Age of Onset   Hypertension Mother    Heart disease Mother        d. 56   Parkinsonism Father        d. 47   Heart disease Sister    Breast cancer Sister 22       s/p mastectomy; reportedly negative BRCA1/2 testing approx 7 years ago   Melanoma Sister        d. 50s; hx of incresed sun exposure   Cancer Sister        dx. sarcoma of her face in her 16s; treated w/ MOHS surgery   Parkinsonism Paternal Aunt        d. 90s   Parkinsonism Paternal Uncle        d. late 65s   Kidney failure Maternal Grandmother 46   Heart attack Maternal Grandfather        d. 39s   Heart disease Paternal Grandmother        d. 60   Breast cancer Other 6       maternal great aunt (MGF's sister)   Kidney cancer Paternal Uncle 51       d. 39s; unsure about smoking status    Past Surgical History:  Procedure Laterality Date   BLADDER HYPEREXTENSION     X4, prior to 01/2006   BLADDER SUSPENSION     BREAST BIOPSY     BREAST LUMPECTOMY Right    2017  BREAST LUMPECTOMY WITH NEEDLE LOCALIZATION AND AXILLARY LYMPH NODE DISSECTION Right 12/05/2015   Procedure: RIGHT BREAST NEEDLE LOCALIZED (WIRES X 2) LUMPECTOMY WITH AXILLARY LYMPH NODE DISSECTION;  Surgeon: Boyce Byes, MD;  Location: [facility] OR;  Service: General;  Laterality: Right;   BREAST LUMPECTOMY WITH RADIOACTIVE SEED  LOCALIZATION Left 12/05/2015   Procedure: RADIOACTIVE SEED GUIDED LEFT BREAST LUMPECTOMY;  Surgeon: Boyce Byes, MD;  Location: [facility] OR;  Service: General;  Laterality: Left;   BREAST SURGERY     BUNIONECTOMY     prior to 01/2006   CARDIAC CATHETERIZATION     CESAREAN SECTION  1979   CHOLECYSTECTOMY  02/08/2006   DR. INGRAM   COLONOSCOPY     CORONARY STENT INTERVENTION N/A 01/01/2017   Procedure: CORONARY STENT INTERVENTION;  Surgeon: Lucendia Rusk, MD;  Location: [facility] INVASIVE CV LAB;  Service: Cardiovascular;  Laterality: N/A;   DENTAL SURGERY  2009-2016   13 dental implants   LEFT HEART CATH AND CORONARY ANGIOGRAPHY N/A 01/01/2017   Procedure: LEFT HEART CATH AND CORONARY ANGIOGRAPHY;  Surgeon: Lucendia Rusk, MD;  Location: [facility] INVASIVE CV LAB;  Service: Cardiovascular;  Laterality: N/A;   LEG SURGERY  07/06/2004   Broken tibia   NASAL SINUS SURGERY     X4; prior to 01/2006   [facility] REMOVAL Right 12/05/2015   Procedure: REMOVAL PORT-A-CATH;  Surgeon: Boyce Byes, MD;  Location: [facility] OR;  Service: General;  Laterality: Right;   PORTACATH PLACEMENT Left 09/08/2015   Procedure: INSERTION OF PORT-A-CATH ;  Surgeon: Boyce Byes, MD;  Location: [facility] OR;  Service: General;  Laterality: Left;   Social History   Occupational History   Not on file  Tobacco Use   Smoking status: Former    Current packs/day: 0.25    Average packs/day: 0.3 packs/day for 15.0 years (3.8 ttl pk-yrs)    Types: Cigarettes   Smokeless tobacco: Never   Tobacco comments:    social smoker/"on and off"  Vaping Use   Vaping status: Never Used  Substance and Sexual Activity   Alcohol  use: Yes    Alcohol /week: 0.0 standard drinks of alcohol     Comment: social   Drug use: No   Sexual activity: Not Currently    Birth control/protection: Post-menopausal

## 2023-09-17 ENCOUNTER — Encounter: Payer: Self-pay | Admitting: Pharmacist Clinician (PhC)/ Clinical Pharmacy Specialist

## 2023-09-17 ENCOUNTER — Telehealth: Payer: Self-pay | Admitting: Pharmacist Clinician (PhC)/ Clinical Pharmacy Specialist

## 2023-09-17 ENCOUNTER — Telehealth: Payer: Self-pay | Admitting: Pharmacy Technician

## 2023-09-17 ENCOUNTER — Ambulatory Visit: Attending: Cardiovascular Disease | Admitting: Pharmacist Clinician (PhC)/ Clinical Pharmacy Specialist

## 2023-09-17 ENCOUNTER — Other Ambulatory Visit (HOSPITAL_COMMUNITY): Payer: Self-pay

## 2023-09-17 DIAGNOSIS — E785 Hyperlipidemia, unspecified: Secondary | ICD-10-CM | POA: Diagnosis present

## 2023-09-17 NOTE — Patient Instructions (Signed)
 Your Results:             Your most recent labs Goal  Total Cholesterol 168 < 200  Triglycerides 85 < 150  HDL (happy/good cholesterol) 81 > 40  LDL (lousy/bad cholesterol 71 < 55   Medication changes:  We will start the process to get Repatha covered by your insurance.  Once the prior authorization is complete, I will send a MyChart message to let you know and confirm pharmacy information.   You will take one injection every 14 days  Lab orders:  We want to repeat labs after 2-3 months.  We will send you a lab order to remind you once we get closer to that time.     Thank you for choosing [facility]

## 2023-09-17 NOTE — Assessment & Plan Note (Signed)
 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  Discussed mechanisms of action, dosing, side effects, potential decreases in LDL cholesterol and costs.  Also reviewed potential options for patient assistance.  Plan: Patient agreeable to starting Repatha 140 mg q14d Repeat labs after:  3 months Lipid Liver function Patient was given information on [employer] - will sign patient up if patient feels med will be cost prohibitive. Marital status Income < $72,000 (single) or < $102,000 (married)

## 2023-09-17 NOTE — Telephone Encounter (Signed)
 Pharmacy Patient Advocate Encounter   Received notification from Pt Calls Messages that prior authorization for repatha is required/requested.   Insurance verification completed.   The patient is insured through [facility] .   Per test claim: PA required; PA submitted to above mentioned insurance via CoverMyMeds Key/confirmation #/EOC [id number] Status is pending

## 2023-09-17 NOTE — Telephone Encounter (Signed)
 Please do PA for Repatha

## 2023-09-17 NOTE — Progress Notes (Signed)
 Office Visit    Patient Name: [name] Date of Encounter: 09/17/2023  Primary Care Provider:  Merl Star, MD Primary Cardiologist:  Arun K Thukkani, MD  Chief Complaint    Hyperlipidemia   Significant Past Medical History   CAD NSTEMI 2018 w/DES to mLAD, mRCA, dRCA, Cx; aortic atherosclerosis seen on chest CT  HTN Not controlled, on telmisartan  40  CHF Chronic diastolic, grade 1  Breast cancer S/p lumpectomy, chemo and xrt  hypothyroid on levothyroxine  75 mcg     Allergies  Allergen Reactions   Chlorhexidine  Other (See Comments)    WASH IS OK. The CLOTH WIPE causes reaction. Causes itching all over the area   Other Rash    Adhesive tape    History of Present Illness    [name] is a 78 y.o. female patient of Dr Lorie Rook, in the office today to discuss options for cholesterol management.    Insurance Carrier: [employer] supplemental  Pharmacy:  [facility]   [facility]:  pt thinks can afford without, aware to reach out if cost prohibitive  LDL Cholesterol goal:  LDL < 55  Current Medications:  rosuvastatin  40 mg  Previously tried:  atorvastatin   Family Hx:  father had angina, mother had MI, CABG x 4 30 years ago, did well then died from MI later; sister had stenting x 2, died at 67; daughters healthy   Social Hx: Tobacco: no Alcohol :  socially    Diet: berries, granola and yogurt for breakfast; not much lunch, protein Ensure for lunch, chicken mostly for protein with fresh vegetables and fruit   Exercise:  walks some, gets 1.5-2 miles per day in steps   Accessory Clinical Findings   Lab Results  Component Value Date   CHOL 168 07/01/2023   HDL 81 07/01/2023   LDLCALC 71 07/01/2023   TRIG 85 07/01/2023   CHOLHDL 2.1 07/01/2023    Lipoprotein (a)  Date/Time Value Ref Range Status  07/01/2023 09:51 AM 94.0 (H) <75.0 nmol/L Final    Comment:    Note:  Values greater than or equal to 75.0 nmol/L may        indicate an  independent risk factor for CHD,        but must be evaluated with caution when applied        to non-Caucasian populations due to the        influence of genetic factors on Lp(a) across        ethnicities.     Lab Results  Component Value Date   ALT 44 (H) 07/01/2023   AST 41 (H) 07/01/2023   ALKPHOS 134 (H) 07/01/2023   BILITOT 0.6 07/01/2023   Lab Results  Component Value Date   CREATININE 0.85 07/01/2023   BUN 13 07/01/2023   NA 140 07/01/2023   K 4.0 07/01/2023   CL 101 07/01/2023   CO2 22 07/01/2023   Lab Results  Component Value Date   HGBA1C 5.4 11/04/2017    Home Medications    Current Outpatient Medications  Medication Sig Dispense Refill   albuterol  (PROVENTIL  HFA;VENTOLIN  HFA) 108 (90 Base) MCG/ACT inhaler Inhale 1-2 puffs into the lungs every 6 (six) hours as needed for wheezing or shortness of breath. Started in 1998     anastrozole  (ARIMIDEX ) 1 MG tablet Take 1 tablet (1 mg total) by mouth daily. 90 tablet 3   buPROPion  (WELLBUTRIN  XL) 300 MG 24 hr tablet Take 300 mg by mouth daily.  Calcium  Carbonate-Vitamin D  (CALCIUM  + D PO) Take 1 tablet by mouth at bedtime. Started approximately 1998     carisoprodol  (SOMA ) 350 MG tablet Take 350 mg by mouth 2 (two) times daily as needed for muscle spasms. Started in 2008.  2   colchicine  0.6 MG tablet Take 1 tablet (0.6 mg total) by mouth daily. 90 tablet 3   diazepam  (VALIUM ) 5 MG tablet Take 5 mg by mouth every 6 (six) hours as needed for muscle spasms (interstitial cystitis). Started approximately 2008.     estradiol  (ESTRACE  VAGINAL) 0.1 MG/GM vaginal cream Place 0.25 Applicatorfuls vaginally at bedtime. Twice weekly 42.5 g 6   furosemide  (LASIX ) 20 MG tablet TAKE 1 TABLET(20 MG) BY MOUTH DAILY AS NEEDED FOR SHORTNESS OF BREATH 30 tablet 5   gabapentin  (NEURONTIN ) 300 MG capsule TAKE 3 TO 4 CAPSULES(900 TO 1200 MG) BY MOUTH AT BEDTIME 120 capsule 4   HYDROcodone -acetaminophen  (NORCO) 10-325 MG tablet Take 1  tablet by mouth every 4 (four) hours as needed for moderate pain or severe pain (as needed for fibromyalgia and IC).      hydrOXYzine  (ATARAX /VISTARIL ) 25 MG tablet Take 1 tablet by mouth at bedtime.     levothyroxine  (SYNTHROID , LEVOTHROID) 75 MCG tablet Take 75 mcg by mouth daily.  2   LORazepam  (ATIVAN ) 1 MG tablet 1 tablet by mouth Once a day at bedtime as needed for 30 days     metroNIDAZOLE  (METROGEL ) 0.75 % vaginal gel Place 1 Applicatorful vaginally at bedtime. Apply one applicatorful to vagina at bedtime for 5 days 70 g 0   nitroGLYCERIN  (NITROSTAT ) 0.4 MG SL tablet FOR CHEST PAIN 1 TABLET UNDER THE TONGUE WAIT 5 MINUTES AND IF STILL HAVING CHEST PAIN REPEAT 2 MORE TIME IF STILL PAIN CALL 911 OR GO TO ER 25 tablet 6   ondansetron  (ZOFRAN ) 4 MG tablet Take 1-2 tablets (4-8 mg total) by mouth every 8 (eight) hours as needed for nausea or vomiting. 60 tablet 4   pantoprazole  (PROTONIX ) 40 MG tablet Take 40 mg by mouth daily. Started in 2012.     potassium chloride  (KLOR-CON ) 10 MEQ tablet TAKE 1 TABLET BY MOUTH AS NEEDED. TAKE WITH LASIX  30 tablet 5   predniSONE  (DELTASONE ) 10 MG tablet Take 2 tablets (20 mg total) by mouth daily with breakfast. 60 tablet 0   promethazine  (PHENERGAN ) 25 MG tablet Take 0.5-1 tablets (12.5-25 mg total) by mouth every 6 (six) hours as needed for nausea or vomiting. 90 tablet 4   rosuvastatin  (CRESTOR ) 40 MG tablet TAKE 1 TABLET(40 MG) BY MOUTH DAILY 90 tablet 3   telmisartan  (MICARDIS ) 40 MG tablet Take 1 tablet (40 mg total) by mouth daily. 90 tablet 3   ticagrelor  (BRILINTA ) 90 MG TABS tablet TAKE 1 TABLET(90 MG) BY MOUTH TWICE DAILY 60 tablet 5   Vaginal Lubricant (REPLENS VA) Place vaginally daily.     Vitamin D , Ergocalciferol , (DRISDOL ) 1.25 MG (50000 UNIT) CAPS capsule Take 1 capsule (50,000 Units total) by mouth every 7 (seven) days. 12 capsule 2   zolpidem  (AMBIEN ) 10 MG tablet Take 10 mg by mouth at bedtime as needed for sleep.     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  Discussed mechanisms of action, dosing, side effects, potential decreases in LDL cholesterol and costs.  Also reviewed potential options for patient assistance.  Plan: Patient agreeable to starting Repatha 140 mg q14d Repeat labs after:  3 months Lipid Liver function Patient was given information on [employer] - will sign patient up if patient feels med will be cost prohibitive. Marital status Income < $72,000 (single) or < $102,000 (married)   Donivan Furry, PharmD CPP [facility] [address]  [place], [place] [address] [phone]  09/17/2023, 3:43 PM

## 2023-09-18 ENCOUNTER — Other Ambulatory Visit (HOSPITAL_COMMUNITY): Payer: Self-pay

## 2023-09-18 NOTE — Telephone Encounter (Signed)
 Pharmacy Patient Advocate Encounter  Received notification from [facility] that Prior Authorization for repatha has been APPROVED from 09/17/23 to 09/16/24. Ran test claim, Copay is $45.00- one month. This test claim was processed through [facility]- copay amounts may vary at other pharmacies due to pharmacy/plan contracts, or as the patient moves through the different stages of their insurance plan.   PA #/Case ID/Reference #: [id number]

## 2023-09-22 MED ORDER — REPATHA SURECLICK 140 MG/ML ~~LOC~~ SOAJ: 140.0000 mg | SUBCUTANEOUS | 3 refills | Status: AC

## 2023-09-22 NOTE — Addendum Note (Signed)
 Addended by: Tajha Sammarco L on: 09/22/2023 12:29 PM   Modules accepted: Orders

## 2023-11-02 ENCOUNTER — Other Ambulatory Visit: Payer: Self-pay | Admitting: Hematology and Oncology

## 2023-11-13 ENCOUNTER — Telehealth: Payer: Self-pay

## 2023-11-13 NOTE — Telephone Encounter (Signed)
 Received message from patient requesting another injection. LVM to ask her the repeat questions.

## 2023-11-14 ENCOUNTER — Telehealth: Payer: Self-pay

## 2023-11-14 DIAGNOSIS — M5416 Radiculopathy, lumbar region: Secondary | ICD-10-CM

## 2023-11-14 NOTE — Telephone Encounter (Signed)
 Last injection 08/14/2023 % 70 relief and function ability Duration of relief/ improvement--3 months Current pain score-8 Recent falls or injuries---none Location Same pain as last time, same area.

## 2023-11-18 ENCOUNTER — Encounter: Payer: Self-pay | Admitting: Physical Medicine and Rehabilitation

## 2023-12-03 ENCOUNTER — Other Ambulatory Visit: Payer: Self-pay

## 2023-12-03 ENCOUNTER — Ambulatory Visit: Admitting: Physical Medicine and Rehabilitation

## 2023-12-03 VITALS — BP 139/78 | HR 63

## 2023-12-03 DIAGNOSIS — M5416 Radiculopathy, lumbar region: Secondary | ICD-10-CM

## 2023-12-03 MED ORDER — METHYLPREDNISOLONE ACETATE 40 MG/ML IJ SUSP
40.0000 mg | Freq: Once | INTRAMUSCULAR | Status: AC
  Administered 2023-12-03: 40 mg

## 2023-12-03 NOTE — Progress Notes (Signed)
 Pain Scale   Average Pain 8 Patient advising she has chronic lower back pain radiating bilaterally to both legs. Patient advising her pain is constant        +Driver, -BT, -Dye Allergies.

## 2023-12-03 NOTE — Patient Instructions (Signed)

## 2023-12-07 NOTE — Progress Notes (Signed)
 [name] - 78 y.o. female MRN [id number]  Date of birth: [birth year]  Office Visit Note: Visit Date: 12/03/2023 PCP: Rexanne Ingle, MD Referred by: Rexanne Ingle, MD  Subjective: Chief Complaint  Patient presents with   Lower Back - Pain   HPI:  [name] is a 78 y.o. female who comes in today for planned repeat Bilateral L4-5  Lumbar Transforaminal epidural steroid injection with fluoroscopic guidance.  The patient has failed conservative care including home exercise, medications, time and activity modification.  This injection will be diagnostic and hopefully therapeutic.  Please see requesting physician notes for further details and justification. Patient received more than 50% pain relief from prior injection.   Referring: Duwaine Pouch, FNP   ROS Otherwise per HPI.  Assessment & Plan: Visit Diagnoses:    ICD-10-CM   1. Lumbar radiculopathy  M54.16 XR C-ARM NO REPORT    Epidural Steroid injection    methylPREDNISolone  acetate (DEPO-MEDROL ) injection 40 mg      Plan: No additional findings.   Meds & Orders:  Meds ordered this encounter  Medications   methylPREDNISolone  acetate (DEPO-MEDROL ) injection 40 mg    Orders Placed This Encounter  Procedures   XR C-ARM NO REPORT   Epidural Steroid injection    Follow-up: Return for visit to requesting provider as needed.   Procedures: No procedures performed  Lumbosacral Transforaminal Epidural Steroid Injection - Sub-Pedicular Approach with Fluoroscopic Guidance  Patient: [name]      Date of Birth: 02/16/1946 MRN: [id number] PCP: Rexanne Ingle, MD      Visit Date: 12/03/2023   Universal Protocol:    Date/Time: 12/03/2023  Consent Given By: the patient  Position: PRONE  Additional Comments: Vital signs were monitored before and after the procedure. Patient was prepped and draped in the usual sterile fashion. The correct patient, procedure, and site was verified.   Injection Procedure  Details:   Procedure diagnoses: Lumbar radiculopathy [M54.16]    Meds Administered:  Meds ordered this encounter  Medications   methylPREDNISolone  acetate (DEPO-MEDROL ) injection 40 mg    Laterality: Bilateral  Location/Site: L4  Needle:5.0 in., 22 ga.  Short bevel or Quincke spinal needle  Needle Placement: Transforaminal  Findings:    -Comments: Excellent flow of contrast along the nerve, nerve root and into the epidural space.  Procedure Details: After squaring off the end-plates to get a true AP view, the C-arm was positioned so that an oblique view of the foramen as noted above was visualized. The target area is just inferior to the nose of the scotty dog or sub pedicular. The soft tissues overlying this structure were infiltrated with 2-3 ml. of 1% Lidocaine  without Epinephrine .  The spinal needle was inserted toward the target using a trajectory view along the fluoroscope beam.  Under AP and lateral visualization, the needle was advanced so it did not puncture dura and was located close the 6 O'Clock position of the pedical in AP tracterory. Biplanar projections were used to confirm position. Aspiration was confirmed to be negative for CSF and/or blood. A 1-2 ml. volume of Isovue -250 was injected and flow of contrast was noted at each level. Radiographs were obtained for documentation purposes.   After attaining the desired flow of contrast documented above, a 0.5 to 1.0 ml test dose of 0.25% Marcaine  was injected into each respective transforaminal space.  The patient was observed for 90 seconds post injection.  After no sensory deficits were reported, and normal lower extremity motor  function was noted,   the above injectate was administered so that equal amounts of the injectate were placed at each foramen (level) into the transforaminal epidural space.   Additional Comments:  The patient tolerated the procedure well Dressing: 2 x 2 sterile gauze and Band-Aid     Post-procedure details: Patient was observed during the procedure. Post-procedure instructions were reviewed.  Patient left the clinic in stable condition.    Clinical History: MRI LUMBAR SPINE WITHOUT CONTRAST     TECHNIQUE:  Multiplanar, multisequence MR imaging of the lumbar spine was  performed. No intravenous contrast was administered.     COMPARISON:  01/29/2014     FINDINGS:  Segmentation:  5 lumbar type vertebrae     Alignment: Levoscoliosis. Grade 1 anterolisthesis at L4-5. Mild  retrolisthesis at L1-2 and L2-3. Misalignment has progressed from  2015.     Vertebrae:  No fracture, evidence of discitis, or bone lesion.     Conus medullaris and cauda equina: Conus extends to the L1-2 level.  Conus and cauda equina appear normal. 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     Disc levels:     T12- L1: Disc narrowing and ventral spurring.  No impingement     L1-L2: Rightward disc collapse with far-lateral bulging and ridging.  Asymmetric right facet spurring. Moderate right foraminal narrowing.  Noncompressive right subarticular recess stenosis.     L2-L3: Disc narrowing and bulging with retrolisthesis. Mild facet  spurring. Mild to moderate left foraminal narrowing.     L3-L4: Disc narrowing and left eccentric bulging. Degenerative facet  spurring asymmetric to the left. Mild left subarticular recess  stenosis.     L4-L5: Facet osteoarthritis with spurring and anterolisthesis.  Moderate disc space narrowing with mild bulging. Bilateral root  sleeve cysts. Mild to moderate spinal stenosis with asymmetric left  subarticular recess narrowing     L5-S1:Disc narrowing and minor bulging. Mild facet spurring. No  neural compression.     IMPRESSION:  1. 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  2. Mild to moderate foraminal  narrowing on the right at L1-2 and  left at L2-3.  3. Mild to moderate spinal stenosis at L4-5 with asymmetric left  subarticular recess narrowing.        Electronically Signed    By: Cassondra Roulette M.D.    On: 05/08/2020 07:15     Objective:  VS:  HT:    WT:   BMI:     BP:139/78  HR:63bpm  TEMP: ( )  RESP:  Physical Exam Vitals and nursing note reviewed.  Constitutional:      General: She is not in acute distress.    Appearance: Normal appearance. She is not ill-appearing.  HENT:     Head: Normocephalic and atraumatic.     Right Ear: External ear normal.     Left Ear: External ear normal.  Eyes:     Extraocular Movements: Extraocular movements intact.  Cardiovascular:     Rate and Rhythm: Normal rate.     Pulses: Normal pulses.  Pulmonary:     Effort: Pulmonary effort is normal. No respiratory distress.  Abdominal:     General: There is no distension.     Palpations: Abdomen is soft.  Musculoskeletal:        General: Tenderness present.     Cervical back: Neck  supple.     Right lower leg: No edema.     Left lower leg: No edema.     Comments: Patient has good distal strength with no pain over the greater trochanters.  No clonus or focal weakness.  Skin:    Findings: No erythema, lesion or rash.  Neurological:     General: No focal deficit present.     Mental Status: She is alert and oriented to person, place, and time.     Sensory: No sensory deficit.     Motor: No weakness or abnormal muscle tone.     Coordination: Coordination normal.  Psychiatric:        Mood and Affect: Mood normal.        Behavior: Behavior normal.      Imaging: No results found.

## 2023-12-07 NOTE — Procedures (Signed)
 Lumbosacral Transforaminal Epidural Steroid Injection - Sub-Pedicular Approach with Fluoroscopic Guidance  Patient: [name]      Date of Birth: 01-Sep-1945 MRN: [id number] PCP: Rexanne Ingle, MD      Visit Date: 12/03/2023   Universal Protocol:    Date/Time: 12/03/2023  Consent Given By: the patient  Position: PRONE  Additional Comments: Vital signs were monitored before and after the procedure. Patient was prepped and draped in the usual sterile fashion. The correct patient, procedure, and site was verified.   Injection Procedure Details:   Procedure diagnoses: Lumbar radiculopathy [M54.16]    Meds Administered:  Meds ordered this encounter  Medications   methylPREDNISolone  acetate (DEPO-MEDROL ) injection 40 mg    Laterality: Bilateral  Location/Site: L4  Needle:5.0 in., 22 ga.  Short bevel or Quincke spinal needle  Needle Placement: Transforaminal  Findings:    -Comments: Excellent flow of contrast along the nerve, nerve root and into the epidural space.  Procedure Details: After squaring off the end-plates to get a true AP view, the C-arm was positioned so that an oblique view of the foramen as noted above was visualized. The target area is just inferior to the nose of the scotty dog or sub pedicular. The soft tissues overlying this structure were infiltrated with 2-3 ml. of 1% Lidocaine  without Epinephrine .  The spinal needle was inserted toward the target using a trajectory view along the fluoroscope beam.  Under AP and lateral visualization, the needle was advanced so it did not puncture dura and was located close the 6 O'Clock position of the pedical in AP tracterory. Biplanar projections were used to confirm position. Aspiration was confirmed to be negative for CSF and/or blood. A 1-2 ml. volume of Isovue -250 was injected and flow of contrast was noted at each level. Radiographs were obtained for documentation purposes.   After attaining the desired  flow of contrast documented above, a 0.5 to 1.0 ml test dose of 0.25% Marcaine  was injected into each respective transforaminal space.  The patient was observed for 90 seconds post injection.  After no sensory deficits were reported, and normal lower extremity motor function was noted,   the above injectate was administered so that equal amounts of the injectate were placed at each foramen (level) into the transforaminal epidural space.   Additional Comments:  The patient tolerated the procedure well Dressing: 2 x 2 sterile gauze and Band-Aid    Post-procedure details: Patient was observed during the procedure. Post-procedure instructions were reviewed.  Patient left the clinic in stable condition.

## 2024-01-08 ENCOUNTER — Telehealth: Payer: Self-pay | Admitting: Orthopedic Surgery

## 2024-01-08 MED ORDER — PREDNISONE 10 MG PO TABS: 20.0000 mg | ORAL_TABLET | Freq: Every day | ORAL | 0 refills | Status: AC

## 2024-01-08 NOTE — Telephone Encounter (Signed)
 Patient called and needs a refill on the Prednisone . [phone]

## 2024-01-08 NOTE — Addendum Note (Signed)
 Addended by: Ronold Hardgrove R on: 01/08/2024 04:26 PM   Modules accepted: Orders

## 2024-01-08 NOTE — Telephone Encounter (Signed)
 Last OV 08/2023 LBP with rad s/s and has had ESI with FN requesting refill on prednisone  please advise.

## 2024-01-22 ENCOUNTER — Other Ambulatory Visit: Payer: Self-pay | Admitting: Internal Medicine

## 2024-03-02 ENCOUNTER — Encounter: Payer: Self-pay | Admitting: Radiology

## 2024-03-06 ENCOUNTER — Telehealth: Payer: Self-pay | Admitting: Physical Medicine and Rehabilitation

## 2024-03-06 DIAGNOSIS — M5416 Radiculopathy, lumbar region: Secondary | ICD-10-CM

## 2024-03-06 NOTE — Telephone Encounter (Signed)
 Patient called and said that she wants to schedule for back injection . [phone]

## 2024-03-09 NOTE — Addendum Note (Signed)
 Addended by: ELDONNA GARDINER POUR on: 03/09/2024 10:47 AM   Modules accepted: Orders

## 2024-03-23 ENCOUNTER — Other Ambulatory Visit: Payer: Self-pay | Admitting: Hematology and Oncology

## 2024-03-23 DIAGNOSIS — Z09 Encounter for follow-up examination after completed treatment for conditions other than malignant neoplasm: Secondary | ICD-10-CM

## 2024-04-06 ENCOUNTER — Telehealth: Payer: Self-pay | Admitting: Internal Medicine

## 2024-04-06 ENCOUNTER — Encounter: Payer: Self-pay | Admitting: Internal Medicine

## 2024-04-06 MED ORDER — FUROSEMIDE 20 MG PO TABS: ORAL_TABLET | ORAL | 1 refills | Status: AC

## 2024-04-06 MED ORDER — POTASSIUM CHLORIDE ER 10 MEQ PO TBCR: EXTENDED_RELEASE_TABLET | ORAL | 1 refills | Status: AC

## 2024-04-06 NOTE — Telephone Encounter (Signed)
*  STAT* If patient is at the pharmacy, call can be transferred to refill team.   1. Which medications need to be refilled? (please list name of each medication and dose if known) furosemide  (LASIX ) 20 MG tablet   potassium chloride  (KLOR-CON ) 10 MEQ tablet   2. Which pharmacy/location (including street and city if local pharmacy) is medication to be sent to? [id number] - [place], [place] - [address] AT [facility] [address]   3. Do they need a 30 day or 90 day supply? 90

## 2024-04-06 NOTE — Telephone Encounter (Signed)
 Refills has been sent to the pharmacy.

## 2024-04-07 ENCOUNTER — Ambulatory Visit: Admitting: Physical Medicine and Rehabilitation

## 2024-04-07 ENCOUNTER — Other Ambulatory Visit: Payer: Self-pay

## 2024-04-07 VITALS — BP 157/80 | HR 70

## 2024-04-07 DIAGNOSIS — M5416 Radiculopathy, lumbar region: Secondary | ICD-10-CM | POA: Diagnosis not present

## 2024-04-07 MED ORDER — METHYLPREDNISOLONE ACETATE 40 MG/ML IJ SUSP
40.0000 mg | Freq: Once | INTRAMUSCULAR | Status: AC
  Administered 2024-04-07: 40 mg

## 2024-04-07 NOTE — Progress Notes (Unsigned)
 Pain Scale   Average Pain 8  Patient advising her pain is constant in her lower back and pain radiates to bilateral legs.        +Driver, -BT, -Dye Allergies.

## 2024-04-16 ENCOUNTER — Encounter

## 2024-04-20 NOTE — Progress Notes (Signed)
 "  [name] - 78 y.o. female MRN [id number]  Date of birth: 06/16/1945  Office Visit Note: Visit Date: 04/07/2024 PCP: Rexanne Ingle, MD Referred by: Rexanne Ingle, MD  Subjective: Chief Complaint  Patient presents with   Lower Back - Pain   HPI:  [name] is a 78 y.o. female who comes in today for planned repeat Bilateral L5 Lumbar Transforaminal epidural steroid injection with fluoroscopic guidance.  The patient has failed conservative care including home exercise, medications, time and activity modification.  This injection will be diagnostic and hopefully therapeutic.  Please see requesting physician notes for further details and justification. Patient received more than 50% pain relief from prior injection.   Referring: Duwaine Pouch, FNP   ROS Otherwise per HPI.  Assessment & Plan: Visit Diagnoses:    ICD-10-CM   1. Lumbar radiculopathy  M54.16 XR C-ARM NO REPORT    Epidural Steroid injection    methylPREDNISolone  acetate (DEPO-MEDROL ) injection 40 mg      Plan: No additional findings.   Meds & Orders:  Meds ordered this encounter  Medications   methylPREDNISolone  acetate (DEPO-MEDROL ) injection 40 mg    Orders Placed This Encounter  Procedures   XR C-ARM NO REPORT   Epidural Steroid injection    Follow-up: Return for visit to requesting provider as needed.   Procedures: No procedures performed  Lumbosacral Transforaminal Epidural Steroid Injection - Sub-Pedicular Approach with Fluoroscopic Guidance  Patient: [name]      Date of Birth: 02/16/1946 MRN: [id number] PCP: Rexanne Ingle, MD      Visit Date: 04/07/2024   Universal Protocol:    Date/Time: 04/07/2024  Consent Given By: the patient  Position: PRONE  Additional Comments: Vital signs were monitored before and after the procedure. Patient was prepped and draped in the usual sterile fashion. The correct patient, procedure, and site was verified.   Injection Procedure  Details:   Procedure diagnoses: Lumbar radiculopathy [M54.16]    Meds Administered:  Meds ordered this encounter  Medications   methylPREDNISolone  acetate (DEPO-MEDROL ) injection 40 mg    Laterality: Bilateral  Location/Site: L5  Needle:5.0 in., 22 ga.  Short bevel or Quincke spinal needle  Needle Placement: Transforaminal  Findings:    -Comments: Excellent flow of contrast along the nerve, nerve root and into the epidural space.  Procedure Details: After squaring off the end-plates to get a true AP view, the C-arm was positioned so that an oblique view of the foramen as noted above was visualized. The target area is just inferior to the nose of the scotty dog or sub pedicular. The soft tissues overlying this structure were infiltrated with 2-3 ml. of 1% Lidocaine  without Epinephrine .  The spinal needle was inserted toward the target using a trajectory view along the fluoroscope beam.  Under AP and lateral visualization, the needle was advanced so it did not puncture dura and was located close the 6 O'Clock position of the pedical in AP tracterory. Biplanar projections were used to confirm position. Aspiration was confirmed to be negative for CSF and/or blood. A 1-2 ml. volume of Isovue -250 was injected and flow of contrast was noted at each level. Radiographs were obtained for documentation purposes.   After attaining the desired flow of contrast documented above, a 0.5 to 1.0 ml test dose of 0.25% Marcaine  was injected into each respective transforaminal space.  The patient was observed for 90 seconds post injection.  After no sensory deficits were reported, and normal lower extremity motor  function was noted,   the above injectate was administered so that equal amounts of the injectate were placed at each foramen (level) into the transforaminal epidural space.   Additional Comments:  The patient tolerated the procedure well Dressing: 2 x 2 sterile gauze and Band-Aid     Post-procedure details: Patient was observed during the procedure. Post-procedure instructions were reviewed.  Patient left the clinic in stable condition.    Clinical History: MRI LUMBAR SPINE WITHOUT CONTRAST     TECHNIQUE:  Multiplanar, multisequence MR imaging of the lumbar spine was  performed. No intravenous contrast was administered.     COMPARISON:  01/29/2014     FINDINGS:  Segmentation:  5 lumbar type vertebrae     Alignment: Levoscoliosis. Grade 1 anterolisthesis at L4-5. Mild  retrolisthesis at L1-2 and L2-3. Misalignment has progressed from  2015.     Vertebrae:  No fracture, evidence of discitis, or bone lesion.     Conus medullaris and cauda equina: Conus extends to the L1-2 level.  Conus and cauda equina appear normal. 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     Disc levels:     T12- L1: Disc narrowing and ventral spurring.  No impingement     L1-L2: Rightward disc collapse with far-lateral bulging and ridging.  Asymmetric right facet spurring. Moderate right foraminal narrowing.  Noncompressive right subarticular recess stenosis.     L2-L3: Disc narrowing and bulging with retrolisthesis. Mild facet  spurring. Mild to moderate left foraminal narrowing.     L3-L4: Disc narrowing and left eccentric bulging. Degenerative facet  spurring asymmetric to the left. Mild left subarticular recess  stenosis.     L4-L5: Facet osteoarthritis with spurring and anterolisthesis.  Moderate disc space narrowing with mild bulging. Bilateral root  sleeve cysts. Mild to moderate spinal stenosis with asymmetric left  subarticular recess narrowing     L5-S1:Disc narrowing and minor bulging. Mild facet spurring. No  neural compression.     IMPRESSION:  1. 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  2. Mild to moderate foraminal  narrowing on the right at L1-2 and  left at L2-3.  3. Mild to moderate spinal stenosis at L4-5 with asymmetric left  subarticular recess narrowing.        Electronically Signed    By: Cassondra Roulette M.D.    On: 05/08/2020 07:15     Objective:  VS:  HT:    WT:   BMI:     BP:(!) 157/80  HR:70bpm  TEMP: ( )  RESP:  Physical Exam Vitals and nursing note reviewed.  Constitutional:      General: She is not in acute distress.    Appearance: Normal appearance. She is not ill-appearing.  HENT:     Head: Normocephalic and atraumatic.     Right Ear: External ear normal.     Left Ear: External ear normal.  Eyes:     Extraocular Movements: Extraocular movements intact.  Cardiovascular:     Rate and Rhythm: Normal rate.     Pulses: Normal pulses.  Pulmonary:     Effort: Pulmonary effort is normal. No respiratory distress.  Abdominal:     General: There is no distension.     Palpations: Abdomen is soft.  Musculoskeletal:        General: Tenderness present.     Cervical back:  Neck supple.     Right lower leg: No edema.     Left lower leg: No edema.     Comments: Patient has good distal strength with no pain over the greater trochanters.  No clonus or focal weakness.  Skin:    Findings: No erythema, lesion or rash.  Neurological:     General: No focal deficit present.     Mental Status: She is alert and oriented to person, place, and time.     Sensory: No sensory deficit.     Motor: No weakness or abnormal muscle tone.     Coordination: Coordination normal.  Psychiatric:        Mood and Affect: Mood normal.        Behavior: Behavior normal.      Imaging: No results found. "

## 2024-04-20 NOTE — Procedures (Signed)
 Lumbosacral Transforaminal Epidural Steroid Injection - Sub-Pedicular Approach with Fluoroscopic Guidance  Patient: [name]      Date of Birth: [birth year] MRN: [id number] PCP: Rexanne Ingle, MD      Visit Date: 04/07/2024   Universal Protocol:    Date/Time: 04/07/2024  Consent Given By: the patient  Position: PRONE  Additional Comments: Vital signs were monitored before and after the procedure. Patient was prepped and draped in the usual sterile fashion. The correct patient, procedure, and site was verified.   Injection Procedure Details:   Procedure diagnoses: Lumbar radiculopathy [M54.16]    Meds Administered:  Meds ordered this encounter  Medications   methylPREDNISolone  acetate (DEPO-MEDROL ) injection 40 mg    Laterality: Bilateral  Location/Site: L5  Needle:5.0 in., 22 ga.  Short bevel or Quincke spinal needle  Needle Placement: Transforaminal  Findings:    -Comments: Excellent flow of contrast along the nerve, nerve root and into the epidural space.  Procedure Details: After squaring off the end-plates to get a true AP view, the C-arm was positioned so that an oblique view of the foramen as noted above was visualized. The target area is just inferior to the nose of the scotty dog or sub pedicular. The soft tissues overlying this structure were infiltrated with 2-3 ml. of 1% Lidocaine  without Epinephrine .  The spinal needle was inserted toward the target using a trajectory view along the fluoroscope beam.  Under AP and lateral visualization, the needle was advanced so it did not puncture dura and was located close the 6 O'Clock position of the pedical in AP tracterory. Biplanar projections were used to confirm position. Aspiration was confirmed to be negative for CSF and/or blood. A 1-2 ml. volume of Isovue -250 was injected and flow of contrast was noted at each level. Radiographs were obtained for documentation purposes.   After attaining the desired  flow of contrast documented above, a 0.5 to 1.0 ml test dose of 0.25% Marcaine  was injected into each respective transforaminal space.  The patient was observed for 90 seconds post injection.  After no sensory deficits were reported, and normal lower extremity motor function was noted,   the above injectate was administered so that equal amounts of the injectate were placed at each foramen (level) into the transforaminal epidural space.   Additional Comments:  The patient tolerated the procedure well Dressing: 2 x 2 sterile gauze and Band-Aid    Post-procedure details: Patient was observed during the procedure. Post-procedure instructions were reviewed.  Patient left the clinic in stable condition.

## 2024-04-27 ENCOUNTER — Other Ambulatory Visit: Payer: Self-pay | Admitting: Hematology and Oncology

## 2024-04-28 ENCOUNTER — Other Ambulatory Visit: Payer: Self-pay

## 2024-06-01 ENCOUNTER — Other Ambulatory Visit: Payer: Self-pay | Admitting: Interventional Cardiology

## 2024-06-05 ENCOUNTER — Telehealth: Payer: Self-pay | Admitting: Orthopedic Surgery

## 2024-06-05 NOTE — Telephone Encounter (Signed)
 Pt  called saying that they need a refill of their medication. [facility]acy is [facility] in [place] Call back number is [phone].

## 2024-06-05 NOTE — Telephone Encounter (Signed)
 Can you please call pt and make an appt? We have not seen her in the office since may. Not sure what medication she would be referring to.

## 2024-06-16 ENCOUNTER — Ambulatory Visit: Admitting: Orthopedic Surgery

## 2024-07-16 ENCOUNTER — Ambulatory Visit: Admitting: Hematology and Oncology
